# Patient Record
Sex: Female | Born: 1945 | Race: White | Hispanic: No | Marital: Single | Smoking: Never smoker
Health system: Southern US, Community
[De-identification: ages and names within clinical notes are randomized; demographics above are authoritative.]

## PROBLEM LIST (undated history)

## (undated) DIAGNOSIS — I10 Essential (primary) hypertension: Secondary | ICD-10-CM

## (undated) DIAGNOSIS — N289 Disorder of kidney and ureter, unspecified: Secondary | ICD-10-CM

## (undated) DIAGNOSIS — E119 Type 2 diabetes mellitus without complications: Secondary | ICD-10-CM

## (undated) DIAGNOSIS — I214 Non-ST elevation (NSTEMI) myocardial infarction: Secondary | ICD-10-CM

## (undated) DIAGNOSIS — A419 Sepsis, unspecified organism: Secondary | ICD-10-CM

## (undated) DIAGNOSIS — N179 Acute kidney failure, unspecified: Secondary | ICD-10-CM

## (undated) DIAGNOSIS — T884XXA Failed or difficult intubation, initial encounter: Secondary | ICD-10-CM

## (undated) DIAGNOSIS — I480 Paroxysmal atrial fibrillation: Secondary | ICD-10-CM

## (undated) HISTORY — PX: TONSILLECTOMY: SUR1361

## (undated) HISTORY — PX: FRACTURE SURGERY: SHX138

---

## 2012-02-10 ENCOUNTER — Ambulatory Visit: Payer: Self-pay | Admitting: Internal Medicine

## 2015-09-27 DIAGNOSIS — A419 Sepsis, unspecified organism: Secondary | ICD-10-CM

## 2015-09-27 HISTORY — DX: Sepsis, unspecified organism: A41.9

## 2015-10-20 ENCOUNTER — Inpatient Hospital Stay: Payer: Medicare HMO

## 2015-10-20 ENCOUNTER — Inpatient Hospital Stay
Admission: EM | Admit: 2015-10-20 | Discharge: 2015-11-09 | DRG: 870 | Disposition: A | Discharge status: Skilled Nursing Facility | Payer: Medicare HMO | Attending: Specialist | Admitting: Specialist

## 2015-10-20 ENCOUNTER — Emergency Department: Payer: Medicare HMO

## 2015-10-20 ENCOUNTER — Inpatient Hospital Stay: Admit: 2015-10-20 | Payer: Medicare HMO

## 2015-10-20 ENCOUNTER — Encounter: Payer: Self-pay | Admitting: Intensive Care

## 2015-10-20 ENCOUNTER — Other Ambulatory Visit: Payer: Self-pay

## 2015-10-20 DIAGNOSIS — R410 Disorientation, unspecified: Secondary | ICD-10-CM | POA: Diagnosis not present

## 2015-10-20 DIAGNOSIS — R4182 Altered mental status, unspecified: Secondary | ICD-10-CM | POA: Diagnosis not present

## 2015-10-20 DIAGNOSIS — E1165 Type 2 diabetes mellitus with hyperglycemia: Secondary | ICD-10-CM | POA: Diagnosis present

## 2015-10-20 DIAGNOSIS — R6521 Severe sepsis with septic shock: Secondary | ICD-10-CM | POA: Diagnosis not present

## 2015-10-20 DIAGNOSIS — G9340 Encephalopathy, unspecified: Secondary | ICD-10-CM | POA: Diagnosis not present

## 2015-10-20 DIAGNOSIS — I4891 Unspecified atrial fibrillation: Secondary | ICD-10-CM | POA: Diagnosis present

## 2015-10-20 DIAGNOSIS — R140 Abdominal distension (gaseous): Secondary | ICD-10-CM

## 2015-10-20 DIAGNOSIS — I214 Non-ST elevation (NSTEMI) myocardial infarction: Secondary | ICD-10-CM | POA: Diagnosis present

## 2015-10-20 DIAGNOSIS — A419 Sepsis, unspecified organism: Secondary | ICD-10-CM

## 2015-10-20 DIAGNOSIS — R601 Generalized edema: Secondary | ICD-10-CM | POA: Diagnosis not present

## 2015-10-20 DIAGNOSIS — Z4659 Encounter for fitting and adjustment of other gastrointestinal appliance and device: Secondary | ICD-10-CM

## 2015-10-20 DIAGNOSIS — Z452 Encounter for adjustment and management of vascular access device: Secondary | ICD-10-CM

## 2015-10-20 DIAGNOSIS — E669 Obesity, unspecified: Secondary | ICD-10-CM | POA: Diagnosis present

## 2015-10-20 DIAGNOSIS — R34 Anuria and oliguria: Secondary | ICD-10-CM | POA: Diagnosis not present

## 2015-10-20 DIAGNOSIS — M6282 Rhabdomyolysis: Secondary | ICD-10-CM | POA: Diagnosis not present

## 2015-10-20 DIAGNOSIS — N390 Urinary tract infection, site not specified: Secondary | ICD-10-CM | POA: Diagnosis present

## 2015-10-20 DIAGNOSIS — E872 Acidosis: Secondary | ICD-10-CM | POA: Diagnosis present

## 2015-10-20 DIAGNOSIS — K7200 Acute and subacute hepatic failure without coma: Secondary | ICD-10-CM | POA: Diagnosis present

## 2015-10-20 DIAGNOSIS — G9341 Metabolic encephalopathy: Secondary | ICD-10-CM | POA: Diagnosis present

## 2015-10-20 DIAGNOSIS — Z23 Encounter for immunization: Secondary | ICD-10-CM

## 2015-10-20 DIAGNOSIS — Z794 Long term (current) use of insulin: Secondary | ICD-10-CM

## 2015-10-20 DIAGNOSIS — N131 Hydronephrosis with ureteral stricture, not elsewhere classified: Secondary | ICD-10-CM | POA: Diagnosis not present

## 2015-10-20 DIAGNOSIS — Z978 Presence of other specified devices: Secondary | ICD-10-CM | POA: Insufficient documentation

## 2015-10-20 DIAGNOSIS — E861 Hypovolemia: Secondary | ICD-10-CM | POA: Diagnosis not present

## 2015-10-20 DIAGNOSIS — J9601 Acute respiratory failure with hypoxia: Secondary | ICD-10-CM | POA: Diagnosis not present

## 2015-10-20 DIAGNOSIS — E876 Hypokalemia: Secondary | ICD-10-CM | POA: Diagnosis not present

## 2015-10-20 DIAGNOSIS — E870 Hyperosmolality and hypernatremia: Secondary | ICD-10-CM | POA: Diagnosis present

## 2015-10-20 DIAGNOSIS — A4159 Other Gram-negative sepsis: Secondary | ICD-10-CM | POA: Insufficient documentation

## 2015-10-20 DIAGNOSIS — B964 Proteus (mirabilis) (morganii) as the cause of diseases classified elsewhere: Secondary | ICD-10-CM | POA: Diagnosis present

## 2015-10-20 DIAGNOSIS — J90 Pleural effusion, not elsewhere classified: Secondary | ICD-10-CM | POA: Diagnosis not present

## 2015-10-20 DIAGNOSIS — E119 Type 2 diabetes mellitus without complications: Secondary | ICD-10-CM

## 2015-10-20 DIAGNOSIS — N179 Acute kidney failure, unspecified: Secondary | ICD-10-CM

## 2015-10-20 DIAGNOSIS — I96 Gangrene, not elsewhere classified: Secondary | ICD-10-CM | POA: Diagnosis present

## 2015-10-20 DIAGNOSIS — E860 Dehydration: Secondary | ICD-10-CM | POA: Diagnosis not present

## 2015-10-20 DIAGNOSIS — Z6841 Body Mass Index (BMI) 40.0 and over, adult: Secondary | ICD-10-CM

## 2015-10-20 DIAGNOSIS — E11649 Type 2 diabetes mellitus with hypoglycemia without coma: Secondary | ICD-10-CM | POA: Diagnosis not present

## 2015-10-20 DIAGNOSIS — R0603 Acute respiratory distress: Secondary | ICD-10-CM | POA: Insufficient documentation

## 2015-10-20 DIAGNOSIS — I959 Hypotension, unspecified: Secondary | ICD-10-CM | POA: Diagnosis not present

## 2015-10-20 DIAGNOSIS — D7582 Heparin induced thrombocytopenia (HIT): Secondary | ICD-10-CM | POA: Diagnosis not present

## 2015-10-20 DIAGNOSIS — R0600 Dyspnea, unspecified: Secondary | ICD-10-CM | POA: Diagnosis not present

## 2015-10-20 DIAGNOSIS — N1330 Unspecified hydronephrosis: Secondary | ICD-10-CM | POA: Diagnosis not present

## 2015-10-20 DIAGNOSIS — R0602 Shortness of breath: Secondary | ICD-10-CM

## 2015-10-20 DIAGNOSIS — N170 Acute kidney failure with tubular necrosis: Secondary | ICD-10-CM | POA: Diagnosis not present

## 2015-10-20 DIAGNOSIS — J9690 Respiratory failure, unspecified, unspecified whether with hypoxia or hypercapnia: Secondary | ICD-10-CM | POA: Diagnosis not present

## 2015-10-20 DIAGNOSIS — Z789 Other specified health status: Secondary | ICD-10-CM | POA: Diagnosis not present

## 2015-10-20 DIAGNOSIS — I9589 Other hypotension: Secondary | ICD-10-CM | POA: Diagnosis not present

## 2015-10-20 DIAGNOSIS — E8809 Other disorders of plasma-protein metabolism, not elsewhere classified: Secondary | ICD-10-CM | POA: Diagnosis present

## 2015-10-20 LAB — URINALYSIS COMPLETE WITH MICROSCOPIC (ARMC ONLY)
BILIRUBIN URINE: NEGATIVE
Glucose, UA: NEGATIVE mg/dL
Nitrite: NEGATIVE
Protein, ur: 100 mg/dL — AB
Specific Gravity, Urine: 1.017 (ref 1.005–1.030)
pH: 6 (ref 5.0–8.0)

## 2015-10-20 LAB — CBC
HEMATOCRIT: 43.1 % (ref 35.0–47.0)
HEMOGLOBIN: 13.6 g/dL (ref 12.0–16.0)
MCH: 30.1 pg (ref 26.0–34.0)
MCHC: 31.6 g/dL — AB (ref 32.0–36.0)
MCV: 95 fL (ref 80.0–100.0)
Platelets: 46 10*3/uL — ABNORMAL LOW (ref 150–440)
RBC: 4.53 MIL/uL (ref 3.80–5.20)
RDW: 15 % — ABNORMAL HIGH (ref 11.5–14.5)
WBC: 47.1 10*3/uL — ABNORMAL HIGH (ref 3.6–11.0)

## 2015-10-20 LAB — CBC WITH DIFFERENTIAL/PLATELET
BASOS ABS: 0 10*3/uL (ref 0–0.1)
BLASTS: 0 %
Band Neutrophils: 36 %
Basophils Relative: 0 %
Eosinophils Absolute: 0 10*3/uL (ref 0–0.7)
Eosinophils Relative: 0 %
HEMATOCRIT: 49.9 % — AB (ref 35.0–47.0)
Hemoglobin: 16.4 g/dL — ABNORMAL HIGH (ref 12.0–16.0)
Lymphocytes Relative: 4 %
Lymphs Abs: 2 10*3/uL (ref 1.0–3.6)
MCH: 29.6 pg (ref 26.0–34.0)
MCHC: 32.8 g/dL (ref 32.0–36.0)
MCV: 90.2 fL (ref 80.0–100.0)
METAMYELOCYTES PCT: 1 %
MONOS PCT: 3 %
Monocytes Absolute: 1.5 10*3/uL — ABNORMAL HIGH (ref 0.2–0.9)
Myelocytes: 0 %
NEUTROS ABS: 47 10*3/uL — AB (ref 1.4–6.5)
NEUTROS PCT: 56 %
Other: 0 %
Platelets: 62 10*3/uL — ABNORMAL LOW (ref 150–440)
Promyelocytes Absolute: 0 %
RBC: 5.54 MIL/uL — AB (ref 3.80–5.20)
RDW: 14 % (ref 11.5–14.5)
WBC: 50.5 10*3/uL — AB (ref 3.6–11.0)
nRBC: 0 /100 WBC

## 2015-10-20 LAB — URINE DRUG SCREEN, QUALITATIVE (ARMC ONLY)
AMPHETAMINES, UR SCREEN: NOT DETECTED
BARBITURATES, UR SCREEN: NOT DETECTED
Benzodiazepine, Ur Scrn: NOT DETECTED
COCAINE METABOLITE, UR ~~LOC~~: NOT DETECTED
Cannabinoid 50 Ng, Ur ~~LOC~~: NOT DETECTED
MDMA (Ecstasy)Ur Screen: NOT DETECTED
METHADONE SCREEN, URINE: NOT DETECTED
OPIATE, UR SCREEN: NOT DETECTED
PHENCYCLIDINE (PCP) UR S: NOT DETECTED
Tricyclic, Ur Screen: NOT DETECTED

## 2015-10-20 LAB — HEPARIN LEVEL (UNFRACTIONATED)

## 2015-10-20 LAB — BLOOD GAS, ARTERIAL
Acid-base deficit: 15 mmol/L — ABNORMAL HIGH (ref 0.0–2.0)
Allens test (pass/fail): POSITIVE — AB
Bicarbonate: 13.6 mEq/L — ABNORMAL LOW (ref 21.0–28.0)
FIO2: 0.99
MECHVT: 500 mL
O2 Saturation: 95.6 %
PEEP: 5 cmH2O
Patient temperature: 37
RATE: 18 resp/min
pCO2 arterial: 41 mmHg (ref 32.0–48.0)
pH, Arterial: 7.13 — CL (ref 7.350–7.450)
pO2, Arterial: 103 mmHg (ref 83.0–108.0)

## 2015-10-20 LAB — COMPREHENSIVE METABOLIC PANEL
ALBUMIN: 3.1 g/dL — AB (ref 3.5–5.0)
ALK PHOS: 567 U/L — AB (ref 38–126)
ALT: 63 U/L — AB (ref 14–54)
AST: 124 U/L — AB (ref 15–41)
Anion gap: 19 — ABNORMAL HIGH (ref 5–15)
BILIRUBIN TOTAL: 2.5 mg/dL — AB (ref 0.3–1.2)
BUN: 66 mg/dL — AB (ref 6–20)
CALCIUM: 9 mg/dL (ref 8.9–10.3)
CO2: 20 mmol/L — ABNORMAL LOW (ref 22–32)
CREATININE: 3.01 mg/dL — AB (ref 0.44–1.00)
Chloride: 101 mmol/L (ref 101–111)
GFR calc Af Amer: 17 mL/min — ABNORMAL LOW (ref >60)
GFR calc non Af Amer: 15 mL/min — ABNORMAL LOW (ref >60)
GLUCOSE: 185 mg/dL — AB (ref 65–99)
POTASSIUM: 3.9 mmol/L (ref 3.5–5.1)
Sodium: 140 mmol/L (ref 135–145)
TOTAL PROTEIN: 7.4 g/dL (ref 6.5–8.1)

## 2015-10-20 LAB — BLOOD GAS, VENOUS
ACID-BASE DEFICIT: 12.2 mmol/L — AB (ref 0.0–2.0)
Acid-base deficit: 7.2 mmol/L — ABNORMAL HIGH (ref 0.0–2.0)
BICARBONATE: 19.7 meq/L — AB (ref 21.0–28.0)
BICARBONATE: 11.6 meq/L — AB (ref 21.0–28.0)
FIO2: 0.99
LHR: 30 {breaths}/min
MECHVT: 500 mL
PCO2 VEN: 22 mmHg — AB (ref 44.0–60.0)
PEEP: 5 cmH2O
PH VEN: 7.26 — AB (ref 7.320–7.430)
PH VEN: 7.33 (ref 7.320–7.430)
Patient temperature: 37
Patient temperature: 37
pCO2, Ven: 44 mmHg (ref 44.0–60.0)

## 2015-10-20 LAB — CREATININE, SERUM
Creatinine, Ser: 2.95 mg/dL — ABNORMAL HIGH (ref 0.44–1.00)
GFR calc non Af Amer: 15 mL/min — ABNORMAL LOW (ref >60)
GFR, EST AFRICAN AMERICAN: 18 mL/min — AB (ref >60)

## 2015-10-20 LAB — MRSA PCR SCREENING: MRSA by PCR: NEGATIVE

## 2015-10-20 LAB — LACTIC ACID, PLASMA
Lactic Acid, Venous: 6.6 mmol/L (ref 0.5–2.0)
Lactic Acid, Venous: 5.9 mmol/L (ref 0.5–2.0)

## 2015-10-20 LAB — PROTIME-INR
INR: 1.46
Prothrombin Time: 17.8 seconds — ABNORMAL HIGH (ref 11.4–15.0)

## 2015-10-20 LAB — TROPONIN I: TROPONIN I: 1.21 ng/mL — AB (ref <0.031)

## 2015-10-20 LAB — ACETAMINOPHEN LEVEL

## 2015-10-20 LAB — AMMONIA: AMMONIA: 34 umol/L (ref 9–35)

## 2015-10-20 LAB — LIPASE, BLOOD: Lipase: 11 U/L (ref 11–51)

## 2015-10-20 LAB — ETHANOL: Alcohol, Ethyl (B): <5 mg/dL (ref <5)

## 2015-10-20 LAB — SALICYLATE LEVEL: Salicylate Lvl: <4 mg/dL (ref 2.8–30.0)

## 2015-10-20 LAB — APTT: APTT: 35 s (ref 24–36)

## 2015-10-20 LAB — CK: Total CK: 1562 U/L — ABNORMAL HIGH (ref 38–234)

## 2015-10-20 MED ORDER — CHLORHEXIDINE GLUCONATE 0.12% ORAL RINSE (MEDLINE KIT)
15.0000 mL | Freq: Two times a day (BID) | OROMUCOSAL | Status: DC
  Administered 2015-10-21 – 2015-10-24 (×7): 15 mL via OROMUCOSAL
  Filled 2015-10-20 (×8): qty 15

## 2015-10-20 MED ORDER — FENTANYL CITRATE (PF) 100 MCG/2ML IJ SOLN
INTRAMUSCULAR | Status: AC
  Filled 2015-10-20: qty 2

## 2015-10-20 MED ORDER — HEPARIN BOLUS VIA INFUSION
4000.0000 [IU] | Freq: Once | INTRAVENOUS | Status: DC
  Filled 2015-10-20: qty 4000

## 2015-10-20 MED ORDER — SODIUM CHLORIDE 0.9 % IV SOLN
INTRAVENOUS | Status: DC
  Administered 2015-10-20 (×2): via INTRAVENOUS

## 2015-10-20 MED ORDER — SODIUM CHLORIDE 0.9 % IV BOLUS (SEPSIS)
1000.0000 mL | Freq: Once | INTRAVENOUS | Status: AC
  Administered 2015-10-20: 1000 mL via INTRAVENOUS

## 2015-10-20 MED ORDER — ROCURONIUM BROMIDE 50 MG/5ML IV SOLN
100.0000 mg | Freq: Once | INTRAVENOUS | Status: AC
  Administered 2015-10-20: 100 mg via INTRAVENOUS
  Filled 2015-10-20: qty 10

## 2015-10-20 MED ORDER — SODIUM CHLORIDE 0.9 % IV SOLN
1.0000 mg/h | INTRAVENOUS | Status: DC
  Filled 2015-10-20: qty 10

## 2015-10-20 MED ORDER — PANTOPRAZOLE SODIUM 40 MG IV SOLR
40.0000 mg | Freq: Every day | INTRAVENOUS | Status: DC
  Administered 2015-10-20 – 2015-10-22 (×3): 40 mg via INTRAVENOUS
  Filled 2015-10-20 (×3): qty 40

## 2015-10-20 MED ORDER — ANTISEPTIC ORAL RINSE SOLUTION (CORINZ)
7.0000 mL | OROMUCOSAL | Status: DC
  Administered 2015-10-21 – 2015-10-24 (×22): 7 mL via OROMUCOSAL
  Filled 2015-10-20 (×27): qty 7

## 2015-10-20 MED ORDER — FENTANYL 2500MCG IN NS 250ML (10MCG/ML) PREMIX INFUSION
0.0000 ug/h | INTRAVENOUS | Status: DC
  Administered 2015-10-20: 25 ug/h via INTRAVENOUS
  Administered 2015-10-20 – 2015-10-21 (×2): 10 ug/h via INTRAVENOUS
  Administered 2015-10-21: 300 ug/h via INTRAVENOUS
  Administered 2015-10-21: 150 ug/h via INTRAVENOUS
  Administered 2015-10-22: 200 ug/h via INTRAVENOUS
  Administered 2015-10-22: 300 ug/h via INTRAVENOUS
  Administered 2015-10-22: 80 ug/h via INTRAVENOUS
  Administered 2015-10-23: 250 ug/h via INTRAVENOUS
  Administered 2015-10-23: 200 ug/h via INTRAVENOUS
  Administered 2015-10-24: 325 ug/h via INTRAVENOUS
  Administered 2015-10-24: 350 ug/h via INTRAVENOUS
  Administered 2015-10-24: 325 ug/h via INTRAVENOUS
  Administered 2015-10-25: 400 ug/h via INTRAVENOUS
  Administered 2015-10-25: 350 ug/h via INTRAVENOUS
  Administered 2015-10-25: 400 ug/h via INTRAVENOUS
  Administered 2015-10-26: 200 ug/h via INTRAVENOUS
  Administered 2015-10-26: 400 ug/h via INTRAVENOUS
  Administered 2015-10-27 (×2): 275 ug/h via INTRAVENOUS
  Administered 2015-10-28: 125 ug/h via INTRAVENOUS
  Administered 2015-10-28: 275 ug/h via INTRAVENOUS
  Administered 2015-10-29: 125 ug/h via INTRAVENOUS
  Filled 2015-10-20 (×23): qty 250

## 2015-10-20 MED ORDER — AMIODARONE IV BOLUS ONLY 150 MG/100ML
150.0000 mg | Freq: Once | INTRAVENOUS | Status: AC
  Administered 2015-10-20: 150 mg via INTRAVENOUS

## 2015-10-20 MED ORDER — DEXTROSE 5 % IV SOLN
0.0000 ug/min | INTRAVENOUS | Status: DC
  Administered 2015-10-20: 20 ug/min via INTRAVENOUS
  Administered 2015-10-20: 30 ug/min via INTRAVENOUS
  Administered 2015-10-21: 47 ug/min via INTRAVENOUS
  Administered 2015-10-21: 61 ug/min via INTRAVENOUS
  Administered 2015-10-21: 60 ug/min via INTRAVENOUS
  Administered 2015-10-21: 65 ug/min via INTRAVENOUS
  Administered 2015-10-22: 20 ug/min via INTRAVENOUS
  Administered 2015-10-22: 30 ug/min via INTRAVENOUS
  Administered 2015-10-25: 2 ug/min via INTRAVENOUS
  Filled 2015-10-20 (×9): qty 16

## 2015-10-20 MED ORDER — SODIUM BICARBONATE 8.4 % IV SOLN
INTRAVENOUS | Status: DC
  Administered 2015-10-21 – 2015-10-22 (×4): via INTRAVENOUS
  Filled 2015-10-20 (×10): qty 150

## 2015-10-20 MED ORDER — HEPARIN SODIUM (PORCINE) 1000 UNIT/ML DIALYSIS
1000.0000 [IU] | INTRAMUSCULAR | Status: DC | PRN
  Filled 2015-10-20 (×2): qty 6

## 2015-10-20 MED ORDER — ONDANSETRON HCL 4 MG PO TABS: 4.0000 mg | ORAL_TABLET | Freq: Four times a day (QID) | ORAL | Status: DC | PRN

## 2015-10-20 MED ORDER — SODIUM BICARBONATE 8.4 % IV SOLN
50.0000 meq | Freq: Once | INTRAVENOUS | Status: AC
  Administered 2015-10-20: 50 meq via INTRAVENOUS

## 2015-10-20 MED ORDER — FENTANYL CITRATE (PF) 100 MCG/2ML IJ SOLN
INTRAMUSCULAR | Status: AC
  Administered 2015-10-20: 100 ug
  Filled 2015-10-20: qty 2

## 2015-10-20 MED ORDER — VASOPRESSIN 20 UNIT/ML IV SOLN
0.0300 [IU]/min | INTRAVENOUS | Status: DC
  Administered 2015-10-20 – 2015-10-22 (×3): 0.03 [IU]/min via INTRAVENOUS
  Filled 2015-10-20 (×5): qty 2

## 2015-10-20 MED ORDER — NOREPINEPHRINE BITARTRATE 1 MG/ML IV SOLN
0.0000 ug/min | Freq: Once | INTRAVENOUS | Status: AC
  Administered 2015-10-20: 28 ug/min via INTRAVENOUS
  Filled 2015-10-20: qty 4

## 2015-10-20 MED ORDER — NOREPINEPHRINE BITARTRATE 1 MG/ML IV SOLN
0.0000 ug/min | Freq: Once | INTRAVENOUS | Status: AC
  Administered 2015-10-20: 30 ug/min via INTRAVENOUS
  Filled 2015-10-20: qty 4

## 2015-10-20 MED ORDER — AMIODARONE HCL IN DEXTROSE 360-4.14 MG/200ML-% IV SOLN
60.0000 mg/h | INTRAVENOUS | Status: DC
  Administered 2015-10-20: 60 mg/h via INTRAVENOUS
  Filled 2015-10-20 (×2): qty 200

## 2015-10-20 MED ORDER — VANCOMYCIN HCL 10 G IV SOLR
1500.0000 mg | INTRAVENOUS | Status: AC
  Administered 2015-10-20: 1500 mg via INTRAVENOUS
  Filled 2015-10-20: qty 1500

## 2015-10-20 MED ORDER — ACETAMINOPHEN 650 MG RE SUPP
650.0000 mg | Freq: Four times a day (QID) | RECTAL | Status: DC | PRN
Start: 2015-10-20 — End: 2015-11-09
  Administered 2015-10-30: 650 mg via RECTAL
  Filled 2015-10-20: qty 1

## 2015-10-20 MED ORDER — ONDANSETRON HCL 4 MG/2ML IJ SOLN: 4.0000 mg | Freq: Four times a day (QID) | INTRAMUSCULAR | Status: DC | PRN

## 2015-10-20 MED ORDER — PUREFLOW DIALYSIS SOLUTION
INTRAVENOUS | Status: DC
  Administered 2015-10-20: 21:00:00 via INTRAVENOUS_CENTRAL
  Administered 2015-10-21: 3 via INTRAVENOUS_CENTRAL
  Administered 2015-10-21: 04:00:00 via INTRAVENOUS_CENTRAL
  Administered 2015-10-21: 3 via INTRAVENOUS_CENTRAL
  Administered 2015-10-22: 01:00:00 via INTRAVENOUS_CENTRAL

## 2015-10-20 MED ORDER — AMIODARONE LOAD VIA INFUSION
150.0000 mg | Freq: Once | INTRAVENOUS | Status: AC
  Administered 2015-10-20: 150 mg via INTRAVENOUS
  Filled 2015-10-20: qty 83.34

## 2015-10-20 MED ORDER — PIPERACILLIN-TAZOBACTAM 3.375 G IVPB
3.3750 g | Freq: Once | INTRAVENOUS | Status: AC
  Administered 2015-10-20: 3.375 g via INTRAVENOUS
  Filled 2015-10-20: qty 50

## 2015-10-20 MED ORDER — MIDAZOLAM HCL 2 MG/2ML IJ SOLN
INTRAMUSCULAR | Status: AC
  Administered 2015-10-20: 2 mg
  Filled 2015-10-20: qty 2

## 2015-10-20 MED ORDER — LORAZEPAM 2 MG/ML IJ SOLN
1.0000 mg | Freq: Once | INTRAMUSCULAR | Status: AC
  Administered 2015-10-20: 1 mg via INTRAVENOUS
  Filled 2015-10-20: qty 1

## 2015-10-20 MED ORDER — PIPERACILLIN-TAZOBACTAM 3.375 G IVPB
3.3750 g | Freq: Three times a day (TID) | INTRAVENOUS | Status: DC
  Administered 2015-10-20 – 2015-10-21 (×2): 3.375 g via INTRAVENOUS
  Filled 2015-10-20 (×4): qty 50

## 2015-10-20 MED ORDER — MIDAZOLAM HCL 2 MG/2ML IJ SOLN
INTRAMUSCULAR | Status: AC
  Filled 2015-10-20: qty 2

## 2015-10-20 MED ORDER — HEPARIN SODIUM (PORCINE) 5000 UNIT/ML IJ SOLN: 5000.0000 [IU] | Freq: Three times a day (TID) | INTRAMUSCULAR | Status: DC

## 2015-10-20 MED ORDER — ACETAMINOPHEN 325 MG PO TABS
650.0000 mg | ORAL_TABLET | Freq: Four times a day (QID) | ORAL | Status: DC | PRN
  Administered 2015-10-22 – 2015-10-28 (×6): 650 mg via ORAL
  Filled 2015-10-20 (×6): qty 2

## 2015-10-20 MED ORDER — VANCOMYCIN HCL IN DEXTROSE 1-5 GM/200ML-% IV SOLN: 1000.0000 mg | INTRAVENOUS | Status: DC

## 2015-10-20 MED ORDER — HEPARIN (PORCINE) IN NACL 100-0.45 UNIT/ML-% IJ SOLN
1550.0000 [IU]/h | INTRAMUSCULAR | Status: DC
  Administered 2015-10-20: 1200 [IU]/h via INTRAVENOUS
  Filled 2015-10-20 (×2): qty 250

## 2015-10-20 MED ORDER — ETOMIDATE 2 MG/ML IV SOLN
30.0000 mg | Freq: Once | INTRAVENOUS | Status: AC
  Administered 2015-10-20: 30 mg via INTRAVENOUS

## 2015-10-20 MED ORDER — VANCOMYCIN HCL IN DEXTROSE 1-5 GM/200ML-% IV SOLN
1000.0000 mg | Freq: Once | INTRAVENOUS | Status: AC
  Administered 2015-10-20: 1000 mg via INTRAVENOUS
  Filled 2015-10-20: qty 200

## 2015-10-20 MED ORDER — AMIODARONE HCL IN DEXTROSE 360-4.14 MG/200ML-% IV SOLN
30.0000 mg/h | INTRAVENOUS | Status: DC
  Administered 2015-10-21 (×2): 60 mg/h via INTRAVENOUS
  Filled 2015-10-20 (×6): qty 200

## 2015-10-20 MED ORDER — SODIUM BICARBONATE 8.4 % IV SOLN
INTRAVENOUS | Status: AC
  Administered 2015-10-20: 50 meq via INTRAVENOUS
  Filled 2015-10-20: qty 50

## 2015-10-20 NOTE — ED Notes (Signed)
Tried calling pts family to obtain allergy information. Number in chart is a non working number.

## 2015-10-20 NOTE — Progress Notes (Signed)
PULMONARY / CRITICAL CARE MEDICINE   Name: April [name] MRN: [id number] DOB: 1946/08/13    ADMISSION DATE:  10/20/2015 CONSULTATION DATE:  10/20/2015  REFERRING MD :  Dr. Fritzi Mandes   CHIEF COMPLAINT:   Respiratory failure Unresponsive   HISTORY OF PRESENT ILLNESS   History per chart review - no family present, patient intubated.   70 y.o. female with unknown past medical history was picked up by EMS today after being found at her house laying on the floor minimally responsive. Per coworkers she has not been to work for 2 days and when they checked on her they found her lying in the floor minimally responsive. EMS was called. Upon arrival to the ED, she only withdrew to pain, due to low GCS and inability to protect her airway she was intubated.     SIGNIFICANT EVENTS  1/24>>found at home unresponsive, intubated in [facility] ED 1/24>>RIJ vascath placed.    PAST MEDICAL HISTORY    :  No past medical history on file. No past surgical history on file. Prior to Admission medications   Not on File   Allergies  Allergen Reactions  . Prednisone Anaphylaxis     FAMILY HISTORY   No family history on file.    SOCIAL HISTORY    has no tobacco, alcohol, and drug history on file.  Review of Systems  Unable to perform ROS: intubated      VITAL SIGNS    Temp:  [96.8 F (36 C)-99 F (37.2 C)] 99 F (37.2 C) (01/24 1700) Pulse Rate:  [153-168] 153 (01/24 1700) Resp:  [18-33] 21 (01/24 1700) BP: (64-127)/(34-101) 110/53 mmHg (01/24 1700) SpO2:  [95 %-99 %] 99 % (01/24 1700) FiO2 (%):  [100 %] 100 % (01/24 1700) Weight:  [249 lb 12.8 oz (113.309 kg)] 249 lb 12.8 oz (113.309 kg) (01/24 1022) HEMODYNAMICS:   VENTILATOR SETTINGS: Vent Mode:  [-] PRVC FiO2 (%):  [100 %] 100 % Set Rate:  [18 bmp] 18 bmp Vt Set:  [500 mL] 500 mL PEEP:  [5 cmH20] 5 cmH20 INTAKE / OUTPUT:  Intake/Output Summary (Last 24 hours) at 10/20/15 1805 Last data filed at 10/20/15 1412  Gross  per 24 hour  Intake 315.88 ml  Output     20 ml  Net 295.88 ml       PHYSICAL EXAM   Physical Exam  Constitutional: She appears well-developed and well-nourished.  HENT:  Head: Normocephalic and atraumatic.  Right Ear: External ear normal.  Left Ear: External ear normal.  Eyes: Right eye exhibits no discharge. Left eye exhibits no discharge.  Neck: Neck supple. No JVD present. No tracheal deviation present. No thyromegaly present.  Cardiovascular: Regular rhythm, normal heart sounds and intact distal pulses.   Pulmonary/Chest: She is in respiratory distress. She has no wheezes. She has no rales.  Intubated No crackles No wheezes  Abdominal: Soft. Bowel sounds are normal. She exhibits no distension. There is no tenderness.  Musculoskeletal: She exhibits no edema.  Neurological:  Intubated and sedated  Skin: She is diaphoretic.  Cool mottled lower extremities, bilaterally   Nursing note and vitals reviewed.      LABS  The following labs were personally reviewed on 10/20/15) LABS:  CBC  Recent Labs Lab 10/20/15 0957  WBC 50.5*  HGB 16.4*  HCT 49.9*  PLT 62*   Coag's  Recent Labs Lab 10/20/15 0957  APTT 35  INR 1.46   BMET  Recent Labs Lab 10/20/15 0957  NA  140  K 3.9  CL 101  CO2 20*  BUN 66*  CREATININE 2.95*  3.01*  GLUCOSE 185*   Electrolytes  Recent Labs Lab 10/20/15 0957  CALCIUM 9.0   Sepsis Markers  Recent Labs Lab 10/20/15 0957 10/20/15 1254  LATICACIDVEN 6.6* 5.9*   ABG No results for input(s): PHART, PCO2ART, PO2ART in the last 168 hours. Liver Enzymes  Recent Labs Lab 10/20/15 0957  AST 124*  ALT 63*  ALKPHOS 567*  BILITOT 2.5*  ALBUMIN 3.1*   Cardiac Enzymes  Recent Labs Lab 10/20/15 0957  TROPONINI 1.21*   Glucose No results for input(s): GLUCAP in the last 168 hours.   Recent Results (from the past 240 hour(s))  Urine culture     Status: None (Preliminary result)   Collection Time: 10/20/15   9:57 AM  Result Value Ref Range Status   Specimen Description URINE, RANDOM  Final   Special Requests NONE  Final   Culture NO GROWTH < 12 HOURS  Final   Report Status PENDING  Incomplete  Culture, blood (routine x 2)     Status: None (Preliminary result)   Collection Time: 10/20/15  9:57 AM  Result Value Ref Range Status   Specimen Description BLOOD RIGHT WRIST  Final   Special Requests   Final    BOTTLES DRAWN AEROBIC AND ANAEROBIC ANA 1ML AER 4ML   Culture NO GROWTH < 12 HOURS  Final   Report Status PENDING  Incomplete  Culture, blood (routine x 2)     Status: None (Preliminary result)   Collection Time: 10/20/15  9:57 AM  Result Value Ref Range Status   Specimen Description BLOOD LEFT HAND  Final   Special Requests   Final    BOTTLES DRAWN AEROBIC AND ANAEROBIC AER 3ML ANA 2ML   Culture NO GROWTH < 12 HOURS  Final   Report Status PENDING  Incomplete     Current facility-administered medications:  .  0.9 %  sodium chloride infusion, , Intravenous, Continuous, Fritzi Mandes, MD .  acetaminophen (TYLENOL) tablet 650 mg, 650 mg, Oral, Q6H PRN **OR** acetaminophen (TYLENOL) suppository 650 mg, 650 mg, Rectal, Q6H PRN, Fritzi Mandes, MD .  fentaNYL (SUBLIMAZE) 100 MCG/2ML injection, , , ,  .  fentaNYL 2556mg in NS 2547m(1042mml) infusion-PREMIX, 10 mcg/hr, Intravenous, Continuous, RebLisa RocaD, Stopped at 10/20/15 1229 .  heparin ADULT infusion 100 units/mL (25000 units/250 mL), 1,200 Units/hr, Intravenous, Continuous, SonFritzi MandesD .  heparin bolus via infusion 4,000 Units, 4,000 Units, Intravenous, Once, SonFritzi MandesD .  midazolam (VERSED) 2 MG/2ML injection, , , ,  .  midazolam (VERSED) 50 mg in sodium chloride 0.9 % 50 mL (1 mg/mL) infusion, 1 mg/hr, Intravenous, Continuous, RebLisa RocaD, Stopped at 10/20/15 1045 .  ondansetron (ZOFRAN) tablet 4 mg, 4 mg, Oral, Q6H PRN **OR** ondansetron (ZOFRAN) injection 4 mg, 4 mg, Intravenous, Q6H PRN, SonFritzi MandesD .  pantoprazole  (PROTONIX) injection 40 mg, 40 mg, Intravenous, QHS, Nicolette Gieske, MD .  piperacillin-tazobactam (ZOSYN) IVPB 3.375 g, 3.375 g, Intravenous, 3 times per day, SonFritzi MandesD  IMAGING  (The following images and results were reviewed by Dr. MunStevenson Clinch 10/20/2015).    Ct Abdomen Pelvis Wo Contrast  10/20/2015  CLINICAL DATA:  Patient had not been hurt from for couple days, was found by EMS between the wall and the bed, responsive to pain, leukocytosis, elevated glucose and creatinine, abdominal swelling, pelvic pain EXAM: CT ABDOMEN AND PELVIS WITHOUT CONTRAST  TECHNIQUE: Multidetector CT imaging of the abdomen and pelvis was performed following the standard protocol without IV contrast. Sagittal and coronal MPR images reconstructed from axial data set. COMPARISON:  None FINDINGS: Bibasilar atelectasis. Calcifications at LEFT lung base may represent calcified granuloma within atelectatic lower lobe. Marked RIGHT hydronephrosis without definite ureteral calcification or dilatation. Edema surrounding the RIGHT kidney can be related to obstruction. Small nonobstructing calculus 4 mm diameter RIGHT kidney image 51. Beam hardening artifacts from patient's arms traverse upper abdomen. Within limits of noncontrast technique and beam hardening artifacts, no definite additional abnormalities of the liver, spleen, atrophic pancreas, or kidneys. BILATERAL thickened adrenal glands without discrete mass. Gallbladder distended without mass or surrounding inflammatory changes. Extensive colonic diverticulosis without evidence of diverticulitis. Stomach and remaining bowel loops normal appearance. Small umbilical hernia containing fat. Normal appendix. Unremarkable uterus and RIGHT adnexa. LEFT ovary prominent size for age at 3.8 x 3.6 x 3.6 cm. Bladder decompressed by Foley catheter. Shotty retroperitoneal nodes without adenopathy. No mass, adenopathy, free air or free fluid. Scattered atherosclerotic calcifications. Bones  demineralized. IMPRESSION: RIGHT hydronephrosis question UPJ obstruction; in the setting of leukocytosis, recommend correlation with urinalysis to exclude urinary tract infection. Scattered colonic diverticulosis. Prominent LEFT ovarian size for age ; non emergent followup pelvic and transvaginal sonography recommended to characterize. Electronically Signed   By: Lavonia Dana M.D.   On: 10/20/2015 12:50   Ct Head Wo Contrast  10/20/2015  CLINICAL DATA:  Found unresponsive today. EXAM: CT HEAD WITHOUT CONTRAST CT CERVICAL SPINE WITHOUT CONTRAST TECHNIQUE: Multidetector CT imaging of the head and cervical spine was performed following the standard protocol without intravenous contrast. Multiplanar CT image reconstructions of the cervical spine were also generated. COMPARISON:  None. FINDINGS: CT HEAD FINDINGS Normal appearing cerebral hemispheres and posterior fossa structures. Normal size and position of the ventricles. No intracranial hemorrhage, mass lesion or CT evidence of acute infarction. Unremarkable bones. Mild mucosal thickening involving all of the paranasal sinuses with a small amount of fluid in the sphenoid sinus bilaterally. CT CERVICAL SPINE FINDINGS An endotracheal tube is in place. Multilevel degenerative changes. Straightening of the normal cervical lordosis. No prevertebral soft tissue swelling, fractures or subluxations. Bilateral carotid artery calcifications. IMPRESSION: 1. No acute intracranial abnormality. 2. No cervical spine fracture or subluxation. 3. Mild chronic pansinusitis with an acute component in the sphenoid sinuses. 4. Cervical spine degenerative changes. 5. Bilateral carotid artery atheromatous calcifications. Electronically Signed   By: Claudie Revering M.D.   On: 10/20/2015 13:25   Ct Cervical Spine Wo Contrast  10/20/2015  CLINICAL DATA:  Found unresponsive today. EXAM: CT HEAD WITHOUT CONTRAST CT CERVICAL SPINE WITHOUT CONTRAST TECHNIQUE: Multidetector CT imaging of the head  and cervical spine was performed following the standard protocol without intravenous contrast. Multiplanar CT image reconstructions of the cervical spine were also generated. COMPARISON:  None. FINDINGS: CT HEAD FINDINGS Normal appearing cerebral hemispheres and posterior fossa structures. Normal size and position of the ventricles. No intracranial hemorrhage, mass lesion or CT evidence of acute infarction. Unremarkable bones. Mild mucosal thickening involving all of the paranasal sinuses with a small amount of fluid in the sphenoid sinus bilaterally. CT CERVICAL SPINE FINDINGS An endotracheal tube is in place. Multilevel degenerative changes. Straightening of the normal cervical lordosis. No prevertebral soft tissue swelling, fractures or subluxations. Bilateral carotid artery calcifications. IMPRESSION: 1. No acute intracranial abnormality. 2. No cervical spine fracture or subluxation. 3. Mild chronic pansinusitis with an acute component in the sphenoid sinuses. 4. Cervical spine  degenerative changes. 5. Bilateral carotid artery atheromatous calcifications. Electronically Signed   By: Claudie Revering M.D.   On: 10/20/2015 13:25   Dg Chest Port 1 View  10/20/2015  CLINICAL DATA:  Hypoxia. EXAM: PORTABLE CHEST 1 VIEW COMPARISON:  None. FINDINGS: Endotracheal tube tip is 4.5 cm above the carina. No pneumothorax. There is no edema or consolidation. Heart size and pulmonary vascularity within normal limits. No adenopathy. No bone lesions. IMPRESSION: Endotracheal tube as described without pneumothorax. No edema or consolidation. Electronically Signed   By: Lowella Grip III M.D.   On: 10/20/2015 10:53      Indwelling Urinary Catheter continued, requirement due to   Reason to continue Indwelling Urinary Catheter for strict Intake/Output monitoring for hemodynamic instability   Central Line continued, requirement due to   Reason to continue [employer] Monitoring of central venous pressure or other  hemodynamic parameters   Ventilator continued, requirement due to, resp failure    Ventilator Sedation RASS 0 to -2   Cultures: BCx2 x2  1/24>> UC 1/24>> Sputum  Antibiotics: Vanc 1/24>> Zosyn 1/24>>  Lines: RIJ Vascath 1/24>>  ASSESSMENT/PLAN  70 yo Female with no sig PMHX, found down and unresponsive, intubated in the ED for respiratory distress, found to have leukocytosis, NSTEMI, UA concerning for infection, respiratory failure, possible rhabdomyolysis.   PULMONARY OETT 7.3m Respiratory failure - hypoxic Septic shock P:   - cont with MV - prn ABG - wean MV as tolerated - maintain sats >90% - WUA\SBT daily  CARDIOVASCULAR CVL - RIJ VASCATH Shock - septic NSTEMI  P:  - maintain MAP >65 - vasopressors as needed  - monitor BP - trend CE - cont with heparin gtt - mostly likely demand ischemia - EKG reviewed, no sig ST changes.   RENAL Anuria ARF UTI ?Rhabdomyolysis P:   -IVF - monitor Cr - may need CRRT, RIJ vascath placed.  - follow up Ur Cx -current CK~1500, cont to follow, IVF -Etoh, salicylate levels normal   GASTROINTESTINAL SUP - PPI Elevated LFTs - trend  HEMATOLOGIC Leukocytosis P:  -most likely related to septic shock - possible source urine -follow CBC -cont abx  INFECTIOUS Sepsis  P:   - cont with current abx - follow up bld and ur cx  ENDOCRINE ICU hypo/hyperglycemia protocol  NEUROLOGIC A:  Acute encephalopathy P:   RASS goal: -1 - metabolic encephalopathy - cont to monitor neuro status.    I have personally obtained a history, examined the patient, evaluated laboratory and imaging results, formulated the assessment and plan and placed orders.  The Patient requires high complexity decision making for assessment and support, frequent evaluation and titration of therapies, application of advanced monitoring technologies and extensive interpretation of multiple databases. Critical Care Time devoted to patient care  services described in this note is 55 minutes.   Overall, patient is critically ill, prognosis is guarded. Patient at high risk for cardiac arrest and death.   VVilinda Boehringer MD [facility] Pager [phone](please enter 7-digits) On Call Pager [phone](please enter 7-digits)     10/20/2015, 6:05 PM  Note: This note was prepared with Dragon dictation along with smaller phrase technology. Any transcriptional errors that result from this process are unintentional.

## 2015-10-20 NOTE — Progress Notes (Signed)
Spoke with Dr. Curt Bears Cardiologist on call informed md pt currently afibb heart rate 150-180's pt currently on 20 mcg of levophed asked md if he wanted pt to receive Amiodarone drip. Given orders to administer Amiodarone bolus and Amiodarone drip

## 2015-10-20 NOTE — Progress Notes (Addendum)
ANTIBIOTIC CONSULT NOTE - INITIAL  Pharmacy Consult for Vancomycin and Zosyn therapy Indication: Sepsis  Allergies  Allergen Reactions  . Prednisone Anaphylaxis    Patient Measurements: Weight: 249 lb 12.8 oz (113.309 kg) Adjusted Body Weight: 83.6 kg (based on height estimation of 5'8)  Vital Signs: Temp: 97.7 F (36.5 C) (01/24 1315) Temp Source: Temporal (01/24 1022) BP: 100/68 mmHg (01/24 1315) Pulse Rate: 164 (01/24 1315) Intake/Output from previous day:   Intake/Output from this shift: Total I/O In: -  Out: 20 [Urine:20]  Labs:  Recent Labs  10/20/15 0957  WBC 50.5*  HGB 16.4*  PLT 62*  CREATININE 3.01*   CrCl cannot be calculated (Unknown ideal weight.). No results for input(s): VANCOTROUGH, VANCOPEAK, VANCORANDOM, GENTTROUGH, GENTPEAK, GENTRANDOM, TOBRATROUGH, TOBRAPEAK, TOBRARND, AMIKACINPEAK, AMIKACINTROU, AMIKACIN in the last 72 hours.   Microbiology: No results found for this or any previous visit (from the past 720 hour(s)).  Medical History: No past medical history on file.  Medications:   (Not in a hospital admission) Scheduled:  . heparin  4,000 Units Intravenous Once   Assessment: Patient is currently admitted to ED and  intubated. Patient's lactic acid is 5.9. Based on serum creatinine of 3.0, patient is on AKI and CrCl can't accurately used to estimated renal function. Her adjusted body weight was estimated @ 83.6 kg (using an estimated height of 5'8)   Patient received 1 gram of Vancomycin @ ~11:00 and 3.375 grams of Zosyn.   Goal of Therapy:  Vancomycin trough level 15-20 mcg/ml  Plan:  Will give an addition 1500 mg to = 2500 mg of Vancomycin, which would be ~22 mg/kg of Vancomycin. Will order a random level 24 hours post dose of Vancomycin to assess renal function and opportunity for redosing. Serum creatinine will be ordered as well to assess renal function and assist with dosing   Zosyn: Will start Zosyn 3.375 g IV q8 hours for  now.   ADD:  Patient now being started on CRRT per nephrology. Will continue current plan. Vancomycin level ordered for 24 hours post dose. If level within goal range of 15-25 mcg/ml, will start Vancomycin 1 g IV X09 hours per policy.   Follow up culture results  Ernest Orr D 10/20/2015,2:51 PM

## 2015-10-20 NOTE — ED Notes (Addendum)
Patient arrived by EMS from home. Patient had not shown up to work in a couple of days. A friend went to check on patient at home and found pt between the wall and the bed. Patient is responsive to pain only. Patient is tachycardia upon arrival. Extremities cold to touch.

## 2015-10-20 NOTE — Progress Notes (Signed)
RT called to [facility] for transporting patient to CT.  Patient transported to CT on trilogy vent without complication.  Patient returned to [facility].  Will continue to monitor.

## 2015-10-20 NOTE — ED Notes (Signed)
MD at bedside. Updated coworkers on pts status.

## 2015-10-20 NOTE — Progress Notes (Signed)
Pt arrived from ED accompanied by RN, ED tech and RT already intubated and on vent. Pt had 7th 1L ns bolus and vancomycin running. ED RN stated Levophed run out, pharmacy was notified and so line was clamped. BP recycled but unable to get reading. ICU charge RN mixed levophed and gtt stated at 4mg.Pt BP recycled, MAP 54. Pt pulses were not palpable, obtained by doppler. Pt responds to pain only. Skin is mottled, extremities cold to the touch, core temp 99. Pt transferred to ICU bed, ICU monitors applied. April [name] Clinchto place cental line and vascath. Pt bp recycled/rechecked 110/64.

## 2015-10-20 NOTE — ED Notes (Signed)
C collar removed

## 2015-10-20 NOTE — H&P (Signed)
April [name] at [facility] NAME: April [name]    MR#:  [id number]  DATE OF BIRTH:  02/02/1946  DATE OF ADMISSION:  10/20/2015  PRIMARY CARE PHYSICIAN: April Billet, MD   REQUESTING/REFERRING PHYSICIAN Dr Edd Fabian  CHIEF COMPLAINT:  Found unresponsive by coworkers. Unknown downtime. Very limited history since patient has no relatives but a old uncle who is not able to tell me about her medical problems HISTORY OF PRESENT ILLNESS:  April [name]  is a 70 y.o. female with no past medical history comes to the emergency room after she was found lying on the floor minimally responsive. Patient had not shown up to work for 2 days and coworkers checked on her and they found her lying on the floor. Down time not known. She was reportedly severely swollen at baseline by coworker. Patient was brought to the emergency room intubated on the ventilator. She started on IV fentanyl and received IV Zosyn and vancomycin. She is on ibuprofen. In the ER patient received 6 L of IV fluids. She is being admitted with septic shock acute renal failure.  PAST MEDICAL HISTORY:  Unable to obtain since patient is intubated on the ventilator  PAST SURGICAL HISTOIRY:  No past surgical history on file.  SOCIAL HISTORY:   Social History  Substance Use Topics  . Smoking status: Unknown If Ever Smoked  . Smokeless tobacco: Not on file  . Alcohol Use: Not on file    FAMILY HISTORY:  Unable to obtain since patient is on the ventilator  DRUG ALLERGIES:   Allergies  Allergen Reactions  . Prednisone Anaphylaxis    REVIEW OF SYSTEMS:  Review of Systems  Unable to perform ROS: intubated     MEDICATIONS AT HOME:   Prior to Admission medications   Not on File      VITAL SIGNS:  Blood pressure 125/54, pulse 156, temperature 98.7 F (37.1 C), temperature source Temporal, resp. rate 20, weight 113.309 kg (249 lb 12.8 oz), SpO2 96 %.  PHYSICAL EXAMINATION:   GENERAL:  70 y.o.-year-old patient lying in the bedcritically ill intubated on the ventilator  EYES: Pupils equal, round,  minimal reactive to light and accommodation. No scleral icterus. Patient has edematous cornea  HEENT: Head atraumatic, normocephalic. Oropharynx and nasopharynx clear.Intubated on the ventilator. Puffy face.  NECK:  Supple, no jugular venous distention. No thyroid enlargement, no tenderness.  LUNGS Decreasedl breath sounds bilaterally, no wheezing, rales,rhonchi or crepitation. No use of accessory muscles of respiration.  CARDIOVASCULAR: S1, S2 normal. No murmurs, rubs, or gallops. Severe tachycardia heart rate in the 160s  ABDOMEN: Soft, nontender, nondistended.  febrile Bowel sounds present. No organomegaly or mass. Morbidly obese  EXTREMITIES:2+  pedal edema,  no cyanosis, or clubbing. Cold extremities  NEUROLOGIC:Unable to exam secondary to patient being on the ventilator Psychiatry patient intubated and on the vent  SKIN:Severe bilateral lower extremity mottling present  LABORATORY PANEL:   CBC  Recent Labs Lab 10/20/15 0957  WBC 50.5*  HGB 16.4*  HCT 49.9*  PLT 62*   ------------------------------------------------------------------------------------------------------------------  Chemistries   Recent Labs Lab 10/20/15 0957  NA 140  K 3.9  CL 101  CO2 20*  GLUCOSE 185*  BUN 66*  CREATININE 2.95*  3.01*  CALCIUM 9.0  AST 124*  ALT 63*  ALKPHOS 567*  BILITOT 2.5*   ------------------------------------------------------------------------------------------------------------------  Cardiac Enzymes  Recent Labs Lab 10/20/15 0957  TROPONINI 1.21*   ------------------------------------------------------------------------------------------------------------------  RADIOLOGY:  Ct Abdomen Pelvis  Wo Contrast  10/20/2015  CLINICAL DATA:  Patient had not been hurt from for couple days, was found by EMS between the wall and the bed, responsive to  pain, leukocytosis, elevated glucose and creatinine, abdominal swelling, pelvic pain EXAM: CT ABDOMEN AND PELVIS WITHOUT CONTRAST TECHNIQUE: Multidetector CT imaging of the abdomen and pelvis was performed following the standard protocol without IV contrast. Sagittal and coronal MPR images reconstructed from axial data set. COMPARISON:  None FINDINGS: Bibasilar atelectasis. Calcifications at LEFT lung base may represent calcified granuloma within atelectatic lower lobe. Marked RIGHT hydronephrosis without definite ureteral calcification or dilatation. Edema surrounding the RIGHT kidney can be related to obstruction. Small nonobstructing calculus 4 mm diameter RIGHT kidney image 51. Beam hardening artifacts from patient's arms traverse upper abdomen. Within limits of noncontrast technique and beam hardening artifacts, no definite additional abnormalities of the liver, spleen, atrophic pancreas, or kidneys. BILATERAL thickened adrenal glands without discrete mass. Gallbladder distended without mass or surrounding inflammatory changes. Extensive colonic diverticulosis without evidence of diverticulitis. Stomach and remaining bowel loops normal appearance. Small umbilical hernia containing fat. Normal appendix. Unremarkable uterus and RIGHT adnexa. LEFT ovary prominent size for age at 3.8 x 3.6 x 3.6 cm. Bladder decompressed by Foley catheter. Shotty retroperitoneal nodes without adenopathy. No mass, adenopathy, free air or free fluid. Scattered atherosclerotic calcifications. Bones demineralized. IMPRESSION: RIGHT hydronephrosis question UPJ obstruction; in the setting of leukocytosis, recommend correlation with urinalysis to exclude urinary tract infection. Scattered colonic diverticulosis. Prominent LEFT ovarian size for age ; non emergent followup pelvic and transvaginal sonography recommended to characterize. Electronically Signed   By: Lavonia Dana M.D.   On: 10/20/2015 12:50   Ct Head Wo Contrast  10/20/2015   CLINICAL DATA:  Found unresponsive today. EXAM: CT HEAD WITHOUT CONTRAST CT CERVICAL SPINE WITHOUT CONTRAST TECHNIQUE: Multidetector CT imaging of the head and cervical spine was performed following the standard protocol without intravenous contrast. Multiplanar CT image reconstructions of the cervical spine were also generated. COMPARISON:  None. FINDINGS: CT HEAD FINDINGS Normal appearing cerebral hemispheres and posterior fossa structures. Normal size and position of the ventricles. No intracranial hemorrhage, mass lesion or CT evidence of acute infarction. Unremarkable bones. Mild mucosal thickening involving all of the paranasal sinuses with a small amount of fluid in the sphenoid sinus bilaterally. CT CERVICAL SPINE FINDINGS An endotracheal tube is in place. Multilevel degenerative changes. Straightening of the normal cervical lordosis. No prevertebral soft tissue swelling, fractures or subluxations. Bilateral carotid artery calcifications. IMPRESSION: 1. No acute intracranial abnormality. 2. No cervical spine fracture or subluxation. 3. Mild chronic pansinusitis with an acute component in the sphenoid sinuses. 4. Cervical spine degenerative changes. 5. Bilateral carotid artery atheromatous calcifications. Electronically Signed   By: Claudie Revering M.D.   On: 10/20/2015 13:25   Ct Cervical Spine Wo Contrast  10/20/2015  CLINICAL DATA:  Found unresponsive today. EXAM: CT HEAD WITHOUT CONTRAST CT CERVICAL SPINE WITHOUT CONTRAST TECHNIQUE: Multidetector CT imaging of the head and cervical spine was performed following the standard protocol without intravenous contrast. Multiplanar CT image reconstructions of the cervical spine were also generated. COMPARISON:  None. FINDINGS: CT HEAD FINDINGS Normal appearing cerebral hemispheres and posterior fossa structures. Normal size and position of the ventricles. No intracranial hemorrhage, mass lesion or CT evidence of acute infarction. Unremarkable bones. Mild mucosal  thickening involving all of the paranasal sinuses with a small amount of fluid in the sphenoid sinus bilaterally. CT CERVICAL SPINE FINDINGS An endotracheal tube is in place. Multilevel degenerative  changes. Straightening of the normal cervical lordosis. No prevertebral soft tissue swelling, fractures or subluxations. Bilateral carotid artery calcifications. IMPRESSION: 1. No acute intracranial abnormality. 2. No cervical spine fracture or subluxation. 3. Mild chronic pansinusitis with an acute component in the sphenoid sinuses. 4. Cervical spine degenerative changes. 5. Bilateral carotid artery atheromatous calcifications. Electronically Signed   By: Claudie Revering M.D.   On: 10/20/2015 13:25   Dg Chest Port 1 View  10/20/2015  CLINICAL DATA:  Hypoxia. EXAM: PORTABLE CHEST 1 VIEW COMPARISON:  None. FINDINGS: Endotracheal tube tip is 4.5 cm above the carina. No pneumothorax. There is no edema or consolidation. Heart size and pulmonary vascularity within normal limits. No adenopathy. No bone lesions. IMPRESSION: Endotracheal tube as described without pneumothorax. No edema or consolidation. Electronically Signed   By: Lowella Grip III M.D.   On: 10/20/2015 10:53    EKG:   Mr. tachycardia  IMPRESSION AND PLAN:   April [name]  is a 70 y.o. female with no past medical history comes to the emergency room after she was found lying on the floor minimally responsive. Patient had not shown up to work for 2 days and coworkers checked on her and they found her lying on the floor. Down time not known. She was reportedly severely swollen at baseline by coworker. Patient was brought to the emergency room intubated on the ventilator.  1. Septic shock, hypovolemic shock -Source appears your urine -Patient intubated on the ventilator admitted to ICU -Recommend management per ICU intensivist -IV fentanyl drip -IV Levophed. Consider vasopressin given severe sepsis -IV vancomycin and Zosyn -Follow blood culture  urine culture and chest x-rays  2. Hypovolemic shock with acute renal failure Patient found unresponsive at home -Creatinine of 3 appears ATN in the setting of sepsis -Already has received 6 L of IV fluids continue maintenance fluids -Avoid nephrotoxins -Monitor I's and O's  3. Severe leukocytosis As above  4. Tachyarrhythmia secondary to #1 -Consider echocardiogram of the heart  5. Metabolic acidosis secondary to severe sepsis with elevated lactic acid of 6.6 Lactic acid down to just 5.9 continue to monitor.  6. Acute rhabdomyolysis secondary to patient being found down on the floor Continue aggressive IV hydration CK total 1562  7. DVT prophylaxis subcutaneous heparin  8. GI prophylaxis IV Protonix  All the records are reviewed and case discussed with ED provider. CODE STATUS: Full code  TOTAL  critical TIME TAKING CARE OF THIS PATIENT:55  minutes.    April [name] M.D on 10/20/2015 at 4:53 PM  Between 7am to 6pm - Pager - [phone]  After 6pm go to [email or web address] - password [email or web address] [facility]  Office  [phone]  CC: Primary care physician; April Billet, MD

## 2015-10-20 NOTE — ED Notes (Addendum)
Coworkers at bedside. Coworkers reported pt complaining about her hip and is unsure if she possibly took a fall.

## 2015-10-20 NOTE — ED Provider Notes (Signed)
[facility] Emergency Department Provider Note   ____________________________________________  Time seen: Approximately 10 AM, upon EMS arrival I have reviewed the triage vital signs and the triage nursing note.  HISTORY  Chief Complaint Altered Mental Status   Historian Limited as patient has altered mental status. History per EMS who picked her up at the scene  HPI April [name] is a 70 y.o. female with unknown past medical history was picked up by EMS today after being found at her house laying on the floor minimally responsive. Reportedly she hadn't shown up work for 2 days and when the friend/coworker checked on her they found her lying in the floor minimally responsive. She was reportedly severely swollen to her baseline according to the friend on scene.Other than this, no known antecedent factors.    No past medical history on file.  There are no active problems to display for this patient.   No past surgical history on file.  No current outpatient prescriptions on file.  Allergies Prednisone  No family history on file.  Social History Social History  Substance Use Topics  . Smoking status: Unknown If Ever Smoked  . Smokeless tobacco: Not on file  . Alcohol Use: Not on file    Review of Systems Limited/unknown as patient is altered. Constitutional: Eyes: . ENT:  Cardiovascular:  Respiratory: Gastrointestinal: . Genitourinary:  Musculoskeletal:  Skin:  Neurological:   ____________________________________________   PHYSICAL EXAM:  VITAL SIGNS: ED Triage Vitals  Enc Vitals Group     BP --      Pulse --      Resp --      Temp --      Temp src --      SpO2 --      Weight --      Height --      Head Cir --      Peak Flow --      Pain Score --      Pain Loc --      Pain Edu? --      Excl. in [place]? --      Constitutional: Occasional moaning. Not opening eyes. Question anasarca Eyes: Conjunctivae are normal. PERRL.  EYELIDS) try to open them, but does look like eyes are moving. ENT   Head: Normocephalic and atraumatic.   Nose: No congestion/rhinnorhea.   Mouth/Throat: Mucous membranes are severely dry.   Neck: No stridor. No step-offs to the cervical spine Cardiovascular/Chest: Tachycardic, regular. No murmurs, rubs, or gallops. Respiratory: Seems to be tachypneic. Mild rhonchi bilateral bases without wheezing or rales. Gastrointestinal: Soft. Obese, does not appear to be tender with palpation. Yeast at abominal skin folds left side Genitourinary/rectal: External exam normal. Musculoskeletal: Small ecchymosis/abrasion left knee Neurologic: Moving 4 extremities, no apparent sensory deficit, but limited ability to test.  Resists eye opening.  Some moaning.  Localizes to painful stimuli. Skin:  Skin is warm, dry and intact. Yeast rash abd folds   ____________________________________________   EKG I, Lisa Roca, MD, the attending physician have personally viewed and interpreted all ECGs.  126 bpm. Sinus tachycardia. Normal axis. Narrow QRS. Nonspecific ST and T-wave ____________________________________________  LABS (pertinent positives/negatives)  Initial lactate 6.6 Repeat Lactic acid 5.90 Urinalysis 2+ leukocytes, too numerous to count red blood cells and white blood cells, many bacteria Venous blood gas significant for pH 7.26, PCO2 44, bicarbonate 19.7 Conference metabolic panel significant for BUN 66 and creatinine 2.01 as well as AST 124, a LT  63, alkaline phosphatase 567, CO2 20 Alcohol less than 5 Lipase 11 Salicylate less than 4 Troponin 1.21 Tylenol less than 10 White blood count 50.5, hemoglobin 16.4, platelet count 62 INR 1.46  ____________________________________________  RADIOLOGY All Xrays were viewed by me. Imaging interpreted by Radiologist.  CXR:  IMPRESSION: Endotracheal tube as described without pneumothorax. No edema or consolidation.  CT abdomen  and pelvis with contrast:  IMPRESSION: RIGHT hydronephrosis question UPJ obstruction; in the setting of leukocytosis, recommend correlation with urinalysis to exclude urinary tract infection.  Scattered colonic diverticulosis.  Prominent LEFT ovarian size for age ; non emergent followup pelvic and transvaginal sonography recommended to characterize.  Ct head and cspine:  IMPRESSION: 1. No acute intracranial abnormality. 2. No cervical spine fracture or subluxation. 3. Mild chronic pansinusitis with an acute component in the sphenoid sinuses. 4. Cervical spine degenerative changes. 5. Bilateral carotid artery atheromatous calcifications. __________________________________________  PROCEDURES  Procedure(s) performed: INTUBATION Performed by: Lisa Roca  Required items: required blood products, implants, devices, and special equipment available Patient identity confirmed: provided demographic data and hospital-assigned identification number Time out: Immediately prior to procedure a "time out" was called to verify the correct patient, procedure, equipment, support staff and site/side marked as required.  Indications: Airway protection   Intubation method: Glidescope Laryngoscopy   Preoxygenation: BVM  Sedatives: 30 milligrams Etomidate Paralytic: 100 mg rocuronium   Tube Size: 7.5 cuffed  Post-procedure assessment: chest rise and ETCO2 monitor Breath sounds: equal and absent over the epigastrium Tube secured with: ETT holder Chest x-ray interpreted by radiologist and me.  Chest x-ray findings: endotracheal tube in appropriate position  Patient tolerated the procedure well with no immediate complications.     Critical Care performed: CRITICAL CARE Performed by: Lisa Roca   Total critical care time: 70 minutes  Critical care time was exclusive of separately billable procedures and treating other patients.  Critical care was necessary to treat or prevent  imminent or life-threatening deterioration.  Critical care was time spent personally by me on the following activities: development of treatment plan with patient and/or surrogate as well as nursing, discussions with consultants, evaluation of patient's response to treatment, examination of patient, obtaining history from patient or surrogate, ordering and performing treatments and interventions, ordering and review of laboratory studies, ordering and review of radiographic studies, pulse oximetry and re-evaluation of patient's condition.   ____________________________________________   ED COURSE / ASSESSMENT AND PLAN  Pertinent labs & imaging results that were available during my care of the patient were reviewed by me and considered in my medical decision making (see chart for details).   Patient altered on arrival also diffusely swollen, and last seen normal multiple days ago. Although she doesn't look like she has clear evidence of trauma and I suspect likely more metabolic cause, I did place her in a c-collar and plan to get a CT of the head and C-spine. She looks severely dehydrated and I would suspect she is in renal failure. Foley catheter was placed for strict I&O measurements.  2 L normal saline started. Her initial fingerstick blood sugar was in the 100s.  No known report of trauma, however she was found off the bed and so a c-collar was placed.  Patient initially fairly tachycardic, but with a good blood pressure. Her respiratory rate was increased and started to have trouble with agitation. I feel like her GCS less than 8, her airway was not protected from aspiration. In addition she needed some sedation in order to  facilitate the rest of her evaluation and treatment. I chose to go ahead and proceed with intubation.   After intubation, patient's blood pressure did drop and after 4 L bolus she was still hypertensive and so Levophed was started. Her blood pressure did respond to  that.    Patient did have an 18-gauge EJ to the left neck which I placed. She also had a another 18-gauge peripheral addition to 3 20-gauge. She has good IV access.  Foley catheter was placed and urine was found to be positive for urinary tract infection. I suspected this time she is feeling with urosepsis. Patient twice and Zosyn were started. Elevated white blood cell count.  She is also not been acute renal failure. She is also found to have elevated troponin consistent with an nSTEMI. Patient was started on bolus and drip of heparin.  Nephew was contacted by patient care advocate and obtain permission for friends who found her to be at bedside with updates.  Discussed with hospitalist Dr. Posey Pronto for ICU admission.  CONSULTATIONS:   Hospitalist for admission.    Patient / Family / Caregiver informed of clinical course, medical decision-making process, and agree with plan.  ___________________________________________   FINAL CLINICAL IMPRESSION(S) / ED DIAGNOSES   Final diagnoses:  Altered mental status, unspecified altered mental status type  Sepsis due to urinary tract infection ([place])  Acute renal failure, unspecified acute renal failure type [facility])  NSTEMI (non-ST elevated myocardial infarction) [facility])              Note: This dictation was prepared with Dragon dictation. Any transcriptional errors that result from this process are unintentional   Lisa Roca, MD 10/20/15 [phone]

## 2015-10-20 NOTE — Progress Notes (Signed)
MEDICATION RELATED CONSULT NOTE - INITIAL   Pharmacy Consult for CRRT medication adjustments Indication: CRRT dosing   Allergies  Allergen Reactions  . Prednisone Anaphylaxis    Patient Measurements: Height: '5\' 7"'$  (170.2 cm) Weight: 249 lb 1.9 oz (113 kg) IBW/kg (Calculated) : 61.6 Adjusted Body Weight:   Vital Signs: Temp: 99.9 F (37.7 C) (01/24 2000) Temp Source: Core (Comment) (01/24 1800) BP: 122/63 mmHg (01/24 2000) Pulse Rate: 145 (01/24 2000) Intake/Output from previous day:   Intake/Output from this shift:    Labs:  Recent Labs  10/20/15 0957  WBC 50.5*  HGB 16.4*  HCT 49.9*  PLT 62*  APTT 35  CREATININE 2.95*  3.01*  ALBUMIN 3.1*  PROT 7.4  AST 124*  ALT 63*  ALKPHOS 567*  BILITOT 2.5*   Estimated Creatinine Clearance: 22.9 mL/min (by C-G formula based on Cr of 3.01).   Microbiology: Recent Results (from the past 720 hour(s))  Urine culture     Status: None (Preliminary result)   Collection Time: 10/20/15  9:57 AM  Result Value Ref Range Status   Specimen Description URINE, RANDOM  Final   Special Requests NONE  Final   Culture NO GROWTH < 12 HOURS  Final   Report Status PENDING  Incomplete  Culture, blood (routine x 2)     Status: None (Preliminary result)   Collection Time: 10/20/15  9:57 AM  Result Value Ref Range Status   Specimen Description BLOOD RIGHT WRIST  Final   Special Requests   Final    BOTTLES DRAWN AEROBIC AND ANAEROBIC ANA 1ML AER 4ML   Culture NO GROWTH < 12 HOURS  Final   Report Status PENDING  Incomplete  Culture, blood (routine x 2)     Status: None (Preliminary result)   Collection Time: 10/20/15  9:57 AM  Result Value Ref Range Status   Specimen Description BLOOD LEFT HAND  Final   Special Requests   Final    BOTTLES DRAWN AEROBIC AND ANAEROBIC AER 3ML ANA 2ML   Culture NO GROWTH < 12 HOURS  Final   Report Status PENDING  Incomplete    Medical History: No past medical history on file.  Medications:  No  prescriptions prior to admission   Scheduled:  . fentaNYL      . heparin  4,000 Units Intravenous Once  . midazolam      . pantoprazole (PROTONIX) IV  40 mg Intravenous QHS  . piperacillin-tazobactam (ZOSYN)  IV  3.375 g Intravenous 3 times per day    Assessment: Pharmacy consulted to review medications daily for any opportunity to renally adjust doses in this 70 year old female patient receiving CRRT  Goal of Therapy:    Plan:  Currently all medications are renally adjusted for CRRT   Foy Vanduyne D 10/20/2015,8:14 PM

## 2015-10-20 NOTE — Procedures (Signed)
  Procedure Note: RIGHT IJ VasCath Placement Melene Plan , [id number] , [id number]-[id number]  Indications: Hemodynamic monitoring / Intravenous access / CRRT EMERGENT PLACEMENT A time-out was completed verifying correct patient, procedure and site.  A 2 lumen catheter available at the time of procedure.  The patient was placed in a dependent position appropriate for central line placement based on the vein to be cannulated.   The patient's RIGHT Internal Jugular Vein/Femoral Vein was prepped and draped in a sterile fashion.  1% Lidocaine WAS NOT used to anesthetize the surrounding skin area.   A 2 lumen catheter was introduced into the RIGHT Internal Jugular Vein/Femoral Vein using Seldinger technique, visualized under ultrasound.  The catheter was threaded smoothly over the guide wire and appropriate blood return was obtained.  Each lumen of the catheter was evacuated of air and flushed with sterile saline.  The catheter was then sutured in place to the skin and a sterile dressing applied.  Perfusion to the extremity distal to the point of catheter insertion was checked and found to be adequate.    Chest X-ray was ordered for confirmation of placement.  The patient tolerated the procedure well and there were no complications.  Vilinda Boehringer, MD [facility] Pager [phone] (please enter 7-digits) On Call Pager - [phone] (please enter 7-digits)

## 2015-10-20 NOTE — ED Notes (Signed)
Lactic acid 5.9. MD notified.

## 2015-10-20 NOTE — Consult Note (Signed)
[facility]  CONSULT NOTE    Date: 10/20/2015                  Patient Name:  April [name]  MRN: [id number]  DOB: October 24, 1945  Age / Sex: 70 y.o., female         PCP: Albina Billet, MD                 Service Requesting Consult: Dr. Stevenson Clinch                 Reason for Consult: Acute renal failure            History of Present Illness: April [name] is a 70 y.o. white  female with unknown past medications history, who was admitted to [facility] on 10/20/2015 for NSTEMI (non-ST elevated myocardial infarction) ([place]) [I21.4] Sepsis due to urinary tract infection ([place]) [A41.9, N39.0] Acute renal failure, unspecified acute renal failure type ([place]) [N17.9] Altered mental status, unspecified altered mental status type [R41.82]  Patient was sick and did not show up to work for 2 days. Then coworkers went to her home to check on her. She was found down unresponsive to minimally responsive. She appeared edematous.  Brought to ED where she required intubation and mechanical ventilation.  Nephrology consulted for anuric urine output despite over 6 litres of saline given. She is now on norepinephrine gtt at 78mg/min. Also on amiodarone gtt.    Medications: Outpatient medications: No prescriptions prior to admission    Current medications: Current Facility-Administered Medications  Medication Dose Route Frequency Provider Last Rate Last Dose  . 0.9 %  sodium chloride infusion   Intravenous Continuous VVilinda Boehringer MD 125 mL/hr at 10/20/15 1927    . acetaminophen (TYLENOL) tablet 650 mg  650 mg Oral Q6H PRN SFritzi Mandes MD       Or  . acetaminophen (TYLENOL) suppository 650 mg  650 mg Rectal Q6H PRN SFritzi Mandes MD      . amiodarone (NEXTERONE) 1.8 mg/mL load via infusion 150 mg  150 mg Intravenous Once Will MMeredith Leeds MD       Followed by  . amiodarone (NEXTERONE PREMIX) 360 MG/200ML (1.8 mg/mL) IV infusion  60 mg/hr Intravenous Continuous Will MMeredith Leeds MD        Followed by  . [START ON 10/21/2015] amiodarone (NEXTERONE PREMIX) 360 MG/200ML (1.8 mg/mL) IV infusion  30 mg/hr Intravenous Continuous Will MMeredith Leeds MD      . fentaNYL (SUBLIMAZE) 100 MCG/2ML injection        100 mcg at 10/20/15 1816  . fentaNYL 25042m in NS 25011m35m52ml) infusion-PREMIX  10 mcg/hr Intravenous Continuous RebeLisa Roca 10 mL/hr at 10/20/15 1927 100 mcg/hr at 10/20/15 1927  . heparin ADULT infusion 100 units/mL (25000 units/250 mL)  1,200 Units/hr Intravenous Continuous SonaFritzi Mandes      . heparin bolus via infusion 4,000 Units  4,000 Units Intravenous Once SonaFritzi Mandes      . midazolam (VERSED) 2 MG/2ML injection        2 mg at 10/20/15 1816  . midazolam (VERSED) 50 mg in sodium chloride 0.9 % 50 mL (1 mg/mL) infusion  1 mg/hr Intravenous Continuous RebeLisa Roca   Stopped at 10/20/15 1045  . norepinephrine (LEVOPHED) 16 mg in dextrose 5 % 250 mL (0.064 mg/mL) infusion  0-40 mcg/min Intravenous Titrated Vishal Mungal, MD 18.8 mL/hr at 10/20/15 1853 20 mcg/min at 10/20/15 1853  . ondansetron (  ZOFRAN) tablet 4 mg  4 mg Oral Q6H PRN Fritzi Mandes, MD       Or  . ondansetron (ZOFRAN) injection 4 mg  4 mg Intravenous Q6H PRN Fritzi Mandes, MD      . pantoprazole (PROTONIX) injection 40 mg  40 mg Intravenous QHS Vishal Mungal, MD      . piperacillin-tazobactam (ZOSYN) IVPB 3.375 g  3.375 g Intravenous 3 times per day Fritzi Mandes, MD          Allergies: Allergies  Allergen Reactions  . Prednisone Anaphylaxis      Past Medical History: No past medical history on file.   Past Surgical History: No past surgical history on file.   Family History: No family history on file.   Social History: Social History   Social History  . Marital Status: Single    Spouse Name: N/A  . Number of Children: N/A  . Years of Education: N/A   Occupational History  . Not on file.   Social History Main Topics  . Smoking status: Unknown If Ever Smoked  . Smokeless  tobacco: Not on file  . Alcohol Use: Not on file  . Drug Use: Not on file  . Sexual Activity: Not on file   Other Topics Concern  . Not on file   Social History Narrative  . No narrative on file     Review of Systems: Review of Systems  Unable to perform ROS: critical illness    Vital Signs: Blood pressure 129/81, pulse 166, temperature 99.9 F (37.7 C), temperature source Core (Comment), resp. rate 28, height '5\' 7"'$  (1.702 m), weight 113 kg (249 lb 1.9 oz), SpO2 97 %.  Weight trends: Filed Weights   10/20/15 1022 10/20/15 1700  Weight: 113.309 kg (249 lb 12.8 oz) 113 kg (249 lb 1.9 oz)    Physical Exam: General: Critically ill, intubated, sedated  Head: +ETT  Eyes: Eyes closed, reactive to light, +icterus  Neck: RIJ temp HD catheter, L external jugular peripheral line  Lungs:  PRVC FiO2 100%  Heart: Irregular rhythm, tachycardia  Abdomen:  Soft, nontender, obese  Extremities: 1+ peripheral edema.  Neurologic: Sedated, intubated  Skin: No lesions  Access: RIJ vascath 10/20/15 Dr. Stevenson Clinch     Lab results: Basic Metabolic Panel:  Recent Labs Lab 10/20/15 0957  NA 140  K 3.9  CL 101  CO2 20*  GLUCOSE 185*  BUN 66*  CREATININE 2.95*  3.01*  CALCIUM 9.0    Liver Function Tests:  Recent Labs Lab 10/20/15 0957  AST 124*  ALT 63*  ALKPHOS 567*  BILITOT 2.5*  PROT 7.4  ALBUMIN 3.1*    Recent Labs Lab 10/20/15 0957  LIPASE 11    Recent Labs Lab 10/20/15 0957  AMMONIA 34    CBC:  Recent Labs Lab 10/20/15 0957  WBC 50.5*  NEUTROABS 47.0*  HGB 16.4*  HCT 49.9*  MCV 90.2  PLT 62*    Cardiac Enzymes:  Recent Labs Lab 10/20/15 0957 10/20/15 1010  CKTOTAL  --  1562*  TROPONINI 1.21*  --     BNP: Invalid input(s): POCBNP  CBG: No results for input(s): GLUCAP in the last 168 hours.  Microbiology: Results for orders placed or performed during the hospital encounter of 10/20/15  Urine culture     Status: None (Preliminary  result)   Collection Time: 10/20/15  9:57 AM  Result Value Ref Range Status   Specimen Description URINE, RANDOM  Final   Special Requests  NONE  Final   Culture NO GROWTH < 12 HOURS  Final   Report Status PENDING  Incomplete  Culture, blood (routine x 2)     Status: None (Preliminary result)   Collection Time: 10/20/15  9:57 AM  Result Value Ref Range Status   Specimen Description BLOOD RIGHT WRIST  Final   Special Requests   Final    BOTTLES DRAWN AEROBIC AND ANAEROBIC ANA 1ML AER 4ML   Culture NO GROWTH < 12 HOURS  Final   Report Status PENDING  Incomplete  Culture, blood (routine x 2)     Status: None (Preliminary result)   Collection Time: 10/20/15  9:57 AM  Result Value Ref Range Status   Specimen Description BLOOD LEFT HAND  Final   Special Requests   Final    BOTTLES DRAWN AEROBIC AND ANAEROBIC AER 3ML ANA 2ML   Culture NO GROWTH < 12 HOURS  Final   Report Status PENDING  Incomplete    Coagulation Studies:  Recent Labs  10/20/15 0957  LABPROT 17.8*  INR 1.46    Urinalysis:  Recent Labs  10/20/15 0957  COLORURINE RED*  LABSPEC 1.017  PHURINE 6.0  GLUCOSEU NEGATIVE  HGBUR 3+*  BILIRUBINUR NEGATIVE  KETONESUR TRACE*  PROTEINUR 100*  NITRITE NEGATIVE  LEUKOCYTESUR 2+*      Imaging: Ct Abdomen Pelvis Wo Contrast  10/20/2015  CLINICAL DATA:  Patient had not been hurt from for couple days, was found by EMS between the wall and the bed, responsive to pain, leukocytosis, elevated glucose and creatinine, abdominal swelling, pelvic pain EXAM: CT ABDOMEN AND PELVIS WITHOUT CONTRAST TECHNIQUE: Multidetector CT imaging of the abdomen and pelvis was performed following the standard protocol without IV contrast. Sagittal and coronal MPR images reconstructed from axial data set. COMPARISON:  None FINDINGS: Bibasilar atelectasis. Calcifications at LEFT lung base may represent calcified granuloma within atelectatic lower lobe. Marked RIGHT hydronephrosis without definite  ureteral calcification or dilatation. Edema surrounding the RIGHT kidney can be related to obstruction. Small nonobstructing calculus 4 mm diameter RIGHT kidney image 51. Beam hardening artifacts from patient's arms traverse upper abdomen. Within limits of noncontrast technique and beam hardening artifacts, no definite additional abnormalities of the liver, spleen, atrophic pancreas, or kidneys. BILATERAL thickened adrenal glands without discrete mass. Gallbladder distended without mass or surrounding inflammatory changes. Extensive colonic diverticulosis without evidence of diverticulitis. Stomach and remaining bowel loops normal appearance. Small umbilical hernia containing fat. Normal appendix. Unremarkable uterus and RIGHT adnexa. LEFT ovary prominent size for age at 3.8 x 3.6 x 3.6 cm. Bladder decompressed by Foley catheter. Shotty retroperitoneal nodes without adenopathy. No mass, adenopathy, free air or free fluid. Scattered atherosclerotic calcifications. Bones demineralized. IMPRESSION: RIGHT hydronephrosis question UPJ obstruction; in the setting of leukocytosis, recommend correlation with urinalysis to exclude urinary tract infection. Scattered colonic diverticulosis. Prominent LEFT ovarian size for age ; non emergent followup pelvic and transvaginal sonography recommended to characterize. Electronically Signed   By: Lavonia Dana M.D.   On: 10/20/2015 12:50   Dg Chest 1 View  10/20/2015  CLINICAL DATA:  Patient found down and minimally responsive 10/20/2015. Status post central line placement. EXAM: CHEST 1 VIEW COMPARISON:  Single view of the chest earlier today. FINDINGS: A new right IJ catheter is in place with the tip projecting over the lower superior vena cava. There is no pneumothorax. Endotracheal tube is again seen. Defibrillator pad is in place. There is no pneumothorax. Small left effusion and basilar atelectasis are identified. Mild interstitial  edema. IMPRESSION: Right IJ catheter tip  projects over the lower superior vena cava. Negative for pneumothorax. Mild interstitial edema with a small left effusion and basilar atelectasis. Electronically Signed   By: Inge Rise M.D.   On: 10/20/2015 18:46   Ct Head Wo Contrast  10/20/2015  CLINICAL DATA:  Found unresponsive today. EXAM: CT HEAD WITHOUT CONTRAST CT CERVICAL SPINE WITHOUT CONTRAST TECHNIQUE: Multidetector CT imaging of the head and cervical spine was performed following the standard protocol without intravenous contrast. Multiplanar CT image reconstructions of the cervical spine were also generated. COMPARISON:  None. FINDINGS: CT HEAD FINDINGS Normal appearing cerebral hemispheres and posterior fossa structures. Normal size and position of the ventricles. No intracranial hemorrhage, mass lesion or CT evidence of acute infarction. Unremarkable bones. Mild mucosal thickening involving all of the paranasal sinuses with a small amount of fluid in the sphenoid sinus bilaterally. CT CERVICAL SPINE FINDINGS An endotracheal tube is in place. Multilevel degenerative changes. Straightening of the normal cervical lordosis. No prevertebral soft tissue swelling, fractures or subluxations. Bilateral carotid artery calcifications. IMPRESSION: 1. No acute intracranial abnormality. 2. No cervical spine fracture or subluxation. 3. Mild chronic pansinusitis with an acute component in the sphenoid sinuses. 4. Cervical spine degenerative changes. 5. Bilateral carotid artery atheromatous calcifications. Electronically Signed   By: Claudie Revering M.D.   On: 10/20/2015 13:25   Ct Cervical Spine Wo Contrast  10/20/2015  CLINICAL DATA:  Found unresponsive today. EXAM: CT HEAD WITHOUT CONTRAST CT CERVICAL SPINE WITHOUT CONTRAST TECHNIQUE: Multidetector CT imaging of the head and cervical spine was performed following the standard protocol without intravenous contrast. Multiplanar CT image reconstructions of the cervical spine were also generated. COMPARISON:   None. FINDINGS: CT HEAD FINDINGS Normal appearing cerebral hemispheres and posterior fossa structures. Normal size and position of the ventricles. No intracranial hemorrhage, mass lesion or CT evidence of acute infarction. Unremarkable bones. Mild mucosal thickening involving all of the paranasal sinuses with a small amount of fluid in the sphenoid sinus bilaterally. CT CERVICAL SPINE FINDINGS An endotracheal tube is in place. Multilevel degenerative changes. Straightening of the normal cervical lordosis. No prevertebral soft tissue swelling, fractures or subluxations. Bilateral carotid artery calcifications. IMPRESSION: 1. No acute intracranial abnormality. 2. No cervical spine fracture or subluxation. 3. Mild chronic pansinusitis with an acute component in the sphenoid sinuses. 4. Cervical spine degenerative changes. 5. Bilateral carotid artery atheromatous calcifications. Electronically Signed   By: Claudie Revering M.D.   On: 10/20/2015 13:25   Dg Chest Port 1 View  10/20/2015  CLINICAL DATA:  Hypoxia. EXAM: PORTABLE CHEST 1 VIEW COMPARISON:  None. FINDINGS: Endotracheal tube tip is 4.5 cm above the carina. No pneumothorax. There is no edema or consolidation. Heart size and pulmonary vascularity within normal limits. No adenopathy. No bone lesions. IMPRESSION: Endotracheal tube as described without pneumothorax. No edema or consolidation. Electronically Signed   By: Lowella Grip III M.D.   On: 10/20/2015 10:53      Assessment & Plan: April [name] is a 70 y.o. white  female with unknown past medications history, who was admitted to [facility] on 10/20/2015  1. Acute renal failure with metabolic acidosis: anuric. Hemodynamically unstable on vasopressors. Unknown baseline creatinine.  CT with right sided perinephric edema.  Acute renal failure from sepsis and rhabdomyolysis.  - Urgent need for renal replacement therapy. Will initiate CRRT tonight.  - Orders prepared. 4K bath 2K DFR BFR 250 - monitor  volume status, urine output, electrolytes and renal function -  discontinue IV fluids - place foley catheter - check renal ultrasound  2. Sepsis/hypotension: wbc >50K. Afebrile on presentation. Urine and blood cultures sent. CXR without infiltrate.  - Norepinephrine - empiric pip/tazo and vanco  3. Tachycardia: SVT.  - echocardiogram - amiodarone started.    LOS: 0 Vernetta Dizdarevic 1/24/20177:43 PM

## 2015-10-20 NOTE — Progress Notes (Addendum)
ANTICOAGULATION CONSULT NOTE - Initial Consult  Pharmacy Consult for Heparin Indication: NSTEMI  Allergies  Allergen Reactions  . Prednisone Anaphylaxis    Patient Measurements: Weight: 249 lb 12.8 oz (113.309 kg)  Estimated height= 68 inches Heparin Dosing Weight: 90 kg  Vital Signs: Temp: 97.7 F (36.5 C) (01/24 1315) Temp Source: Temporal (01/24 1022) BP: 100/68 mmHg (01/24 1315) Pulse Rate: 164 (01/24 1315)  Labs:  Recent Labs  10/20/15 0957  HGB 16.4*  HCT 49.9*  PLT 62*  LABPROT 17.8*  INR 1.46  CREATININE 3.01*  TROPONINI 1.21*    CrCl cannot be calculated (Unknown ideal weight.).   Medical History: No past medical history on file.  Medications:  Scheduled:  . heparin  4,000 Units Intravenous Once   Infusions:  . fentaNYL infusion INTRAVENOUS Stopped (10/20/15 1229)  . heparin    . midazolam (VERSED) infusion Stopped (10/20/15 1045)    Assessment: 70 y/o F admitted after being found unresponsive. Patient with sepsis/NSTEMI. No PMH or PTA meds known at this time. INR is acceptable for bolus. Will go ahead and order baseline HL and APTT due to unkown medication history. Height is estimated. No family members available and patient is intubated.   Goal of Therapy:  Heparin level 0.3-0.7 units/ml Monitor platelets by anticoagulation protocol: Yes   Plan:  Give 4000 units bolus x 1 Start heparin infusion at 1200 units/hr Check anti-Xa level in 8 hours and daily while on heparin Continue to monitor H&H and platelets  Ulice Dash D 10/20/2015,2:25 PM

## 2015-10-20 NOTE — ED Notes (Signed)
CRITICAL VALUE ALERT  Critical value received:  50.5 WBC  Date of notification:  10/20/2015  Time of notification:  10:33am  Critical value read back:Yes.    Nurse who received alert:  Waymon Amato  MD notified (1st page):  Dr. Reita Cliche  Time of first page:  10:33  MD notified (2nd page):  Time of second page:  Responding MD:  Dr.lord  Time MD responded:  10:33

## 2015-10-20 NOTE — Progress Notes (Signed)
RT called to room for intubation.  Patient preoxygenated with BVM, intubated by Dr. Reita Cliche on 1st attempt with 7.5 ETT at 21 at lip using #3blade with glidoscope.  Patient ETT secured at 21 at lip, equal BBS, placed on trilogy vent AC 500, 18, 100%, no PEEP due to low BP at this time.  Will continue to monitor.

## 2015-10-20 NOTE — Progress Notes (Addendum)
[facility] Progress Note Patient Name: April [name] DOB: Apr 24, 1946 MRN: [id number]   Date of Service  10/20/2015  HPI/Events of Note  Multiple issues: 1. Remains in AFIB with RVR and 2. Remains hypotensive - SBP - 70's. ABG - pH improved to 7.33 and Hgb = 13.6. Unable to place CVL, therefore, no CVP or COOX  available.   eICU Interventions  Will order: 1. Increase ceiling on Norepinephrine IV infusion to 70 mcg/min. 2. Bolus with Amiodarone 150 mg IV over 10 minutes now.      Intervention Category Major Interventions: Hypotension - evaluation and management;Arrhythmia - evaluation and management  Sommer,Steven Eugene 10/20/2015, 11:15 PM

## 2015-10-20 NOTE — ED Notes (Signed)
Spoke with pt's uncle Deneen Harts who gave permission for Sharee Pimple & Aris Lot and Evlyn Clines to be able to see pt and obtain medical information about pt.

## 2015-10-20 NOTE — Progress Notes (Signed)
Medically patient's uncle, job Librarian, academic, coworker and explained the critical illness with patient All questions answered Family understands patient is critically ill and there is a possibility she may not survive this hospitalization. Patient remains a full code. Critical time 20 minutes.

## 2015-10-21 ENCOUNTER — Inpatient Hospital Stay (HOSPITAL_COMMUNITY)
Admit: 2015-10-21 | Discharge: 2015-10-21 | Disposition: A | Discharge status: Home / Self Care | Payer: Medicare HMO | Attending: Nephrology | Admitting: Nephrology

## 2015-10-21 ENCOUNTER — Inpatient Hospital Stay: Payer: Medicare HMO

## 2015-10-21 ENCOUNTER — Encounter: Payer: Self-pay | Admitting: Critical Care Medicine

## 2015-10-21 DIAGNOSIS — Z789 Other specified health status: Secondary | ICD-10-CM

## 2015-10-21 DIAGNOSIS — N1330 Unspecified hydronephrosis: Secondary | ICD-10-CM

## 2015-10-21 DIAGNOSIS — I959 Hypotension, unspecified: Secondary | ICD-10-CM | POA: Insufficient documentation

## 2015-10-21 DIAGNOSIS — I4891 Unspecified atrial fibrillation: Secondary | ICD-10-CM

## 2015-10-21 DIAGNOSIS — R0603 Acute respiratory distress: Secondary | ICD-10-CM | POA: Insufficient documentation

## 2015-10-21 DIAGNOSIS — R0600 Dyspnea, unspecified: Secondary | ICD-10-CM

## 2015-10-21 DIAGNOSIS — Z978 Presence of other specified devices: Secondary | ICD-10-CM | POA: Insufficient documentation

## 2015-10-21 DIAGNOSIS — J9621 Acute and chronic respiratory failure with hypoxia: Secondary | ICD-10-CM

## 2015-10-21 DIAGNOSIS — N179 Acute kidney failure, unspecified: Secondary | ICD-10-CM | POA: Insufficient documentation

## 2015-10-21 LAB — PHOSPHORUS: Phosphorus: 3.1 mg/dL (ref 2.5–4.6)

## 2015-10-21 LAB — GLUCOSE, CAPILLARY
GLUCOSE-CAPILLARY: 206 mg/dL — AB (ref 65–99)
GLUCOSE-CAPILLARY: 233 mg/dL — AB (ref 65–99)
GLUCOSE-CAPILLARY: 317 mg/dL — AB (ref 65–99)
Glucose-Capillary: 271 mg/dL — ABNORMAL HIGH (ref 65–99)

## 2015-10-21 LAB — TROPONIN I
TROPONIN I: 1.08 ng/mL — AB (ref <0.031)
Troponin I: 0.44 ng/mL — ABNORMAL HIGH (ref <0.031)

## 2015-10-21 LAB — RENAL FUNCTION PANEL
Albumin: 1.9 g/dL — ABNORMAL LOW (ref 3.5–5.0)
Anion gap: 12 (ref 5–15)
BUN: 34 mg/dL — AB (ref 6–20)
CHLORIDE: 101 mmol/L (ref 101–111)
CO2: 23 mmol/L (ref 22–32)
Calcium: 7.6 mg/dL — ABNORMAL LOW (ref 8.9–10.3)
Creatinine, Ser: 1.59 mg/dL — ABNORMAL HIGH (ref 0.44–1.00)
GFR calc Af Amer: 37 mL/min — ABNORMAL LOW (ref >60)
GFR, EST NON AFRICAN AMERICAN: 32 mL/min — AB (ref >60)
Glucose, Bld: 317 mg/dL — ABNORMAL HIGH (ref 65–99)
POTASSIUM: 4.2 mmol/L (ref 3.5–5.1)
Phosphorus: 2.3 mg/dL — ABNORMAL LOW (ref 2.5–4.6)
Sodium: 136 mmol/L (ref 135–145)

## 2015-10-21 LAB — COMPREHENSIVE METABOLIC PANEL
ALT: 176 U/L — AB (ref 14–54)
AST: 312 U/L — AB (ref 15–41)
Albumin: 2.1 g/dL — ABNORMAL LOW (ref 3.5–5.0)
Alkaline Phosphatase: 391 U/L — ABNORMAL HIGH (ref 38–126)
Anion gap: 14 (ref 5–15)
BUN: 45 mg/dL — AB (ref 6–20)
CHLORIDE: 103 mmol/L (ref 101–111)
CO2: 18 mmol/L — AB (ref 22–32)
CREATININE: 1.87 mg/dL — AB (ref 0.44–1.00)
Calcium: 7.3 mg/dL — ABNORMAL LOW (ref 8.9–10.3)
GFR calc Af Amer: 31 mL/min — ABNORMAL LOW (ref >60)
GFR calc non Af Amer: 26 mL/min — ABNORMAL LOW (ref >60)
Glucose, Bld: 263 mg/dL — ABNORMAL HIGH (ref 65–99)
Potassium: 3.9 mmol/L (ref 3.5–5.1)
SODIUM: 135 mmol/L (ref 135–145)
Total Bilirubin: 3.1 mg/dL — ABNORMAL HIGH (ref 0.3–1.2)
Total Protein: 5.3 g/dL — ABNORMAL LOW (ref 6.5–8.1)

## 2015-10-21 LAB — BLOOD GAS, ARTERIAL
ACID-BASE DEFICIT: 6.4 mmol/L — AB (ref 0.0–2.0)
Allens test (pass/fail): POSITIVE — AB
BICARBONATE: 18.2 meq/L — AB (ref 21.0–28.0)
FIO2: 0.8
MECHVT: 500 mL
O2 SAT: 99.6 %
PCO2 ART: 33 mmHg (ref 32.0–48.0)
PH ART: 7.35 (ref 7.350–7.450)
PIP: 5 cmH2O
Patient temperature: 37
RATE: 30 resp/min
pO2, Arterial: 193 mmHg — ABNORMAL HIGH (ref 83.0–108.0)

## 2015-10-21 LAB — BLOOD CULTURE ID PANEL (REFLEXED)
ACINETOBACTER BAUMANNII: NOT DETECTED
CANDIDA ALBICANS: NOT DETECTED
CANDIDA KRUSEI: NOT DETECTED
CANDIDA PARAPSILOSIS: NOT DETECTED
Candida glabrata: NOT DETECTED
Candida tropicalis: NOT DETECTED
Carbapenem resistance: NOT DETECTED
ENTEROCOCCUS SPECIES: NOT DETECTED
ESCHERICHIA COLI: NOT DETECTED
Enterobacter cloacae complex: NOT DETECTED
Enterobacteriaceae species: DETECTED — AB
Haemophilus influenzae: NOT DETECTED
KLEBSIELLA OXYTOCA: NOT DETECTED
Klebsiella pneumoniae: NOT DETECTED
LISTERIA MONOCYTOGENES: NOT DETECTED
Methicillin resistance: NOT DETECTED
Neisseria meningitidis: NOT DETECTED
Pseudomonas aeruginosa: NOT DETECTED
SERRATIA MARCESCENS: NOT DETECTED
STREPTOCOCCUS AGALACTIAE: NOT DETECTED
STREPTOCOCCUS PYOGENES: NOT DETECTED
STREPTOCOCCUS SPECIES: NOT DETECTED
Staphylococcus aureus (BCID): NOT DETECTED
Staphylococcus species: NOT DETECTED
Streptococcus pneumoniae: NOT DETECTED
Vancomycin resistance: NOT DETECTED

## 2015-10-21 LAB — CBC
HCT: 40.2 % (ref 35.0–47.0)
Hemoglobin: 13.3 g/dL (ref 12.0–16.0)
MCH: 30 pg (ref 26.0–34.0)
MCHC: 33 g/dL (ref 32.0–36.0)
MCV: 90.8 fL (ref 80.0–100.0)
PLATELETS: 23 10*3/uL — AB (ref 150–440)
RBC: 4.43 MIL/uL (ref 3.80–5.20)
RDW: 14.5 % (ref 11.5–14.5)
WBC: 38.8 10*3/uL — ABNORMAL HIGH (ref 3.6–11.0)

## 2015-10-21 LAB — PROTIME-INR
INR: 1.52
Prothrombin Time: 18.4 seconds — ABNORMAL HIGH (ref 11.4–15.0)

## 2015-10-21 LAB — CK: Total CK: 779 U/L — ABNORMAL HIGH (ref 38–234)

## 2015-10-21 LAB — LACTIC ACID, PLASMA
Lactic Acid, Venous: 3.7 mmol/L (ref 0.5–2.0)
Lactic Acid, Venous: 2.9 mmol/L (ref 0.5–2.0)

## 2015-10-21 LAB — HEPARIN LEVEL (UNFRACTIONATED): Heparin Unfractionated: <0.1 IU/mL — ABNORMAL LOW (ref 0.30–0.70)

## 2015-10-21 LAB — MAGNESIUM: Magnesium: 1.5 mg/dL — ABNORMAL LOW (ref 1.7–2.4)

## 2015-10-21 LAB — FIBRINOGEN: Fibrinogen: >750 mg/dL — ABNORMAL HIGH (ref 210–470)

## 2015-10-21 MED ORDER — AMIODARONE HCL IN DEXTROSE 360-4.14 MG/200ML-% IV SOLN
60.0000 mg/h | INTRAVENOUS | Status: DC
  Administered 2015-10-21 – 2015-10-23 (×8): 60 mg/h via INTRAVENOUS
  Filled 2015-10-21 (×17): qty 200

## 2015-10-21 MED ORDER — HYDROCORTISONE NA SUCCINATE PF 100 MG IJ SOLR
50.0000 mg | Freq: Four times a day (QID) | INTRAMUSCULAR | Status: AC
  Administered 2015-10-21 – 2015-10-24 (×12): 50 mg via INTRAVENOUS
  Filled 2015-10-21 (×12): qty 2

## 2015-10-21 MED ORDER — DEXTROSE 5 % IV SOLN
2.0000 g | INTRAVENOUS | Status: DC
  Administered 2015-10-22: 2 g via INTRAVENOUS
  Filled 2015-10-21 (×2): qty 2

## 2015-10-21 MED ORDER — DEXTROSE 5 % IV SOLN
2.0000 g | Freq: Once | INTRAVENOUS | Status: AC
  Administered 2015-10-21: 2 g via INTRAVENOUS
  Filled 2015-10-21: qty 2

## 2015-10-21 MED ORDER — HEPARIN BOLUS VIA INFUSION
2700.0000 [IU] | Freq: Once | INTRAVENOUS | Status: AC
  Administered 2015-10-21: 2700 [IU] via INTRAVENOUS
  Filled 2015-10-21: qty 2700

## 2015-10-21 MED ORDER — CALCIUM GLUCONATE 10 % IV SOLN
2.0000 g | Freq: Once | INTRAVENOUS | Status: AC
  Administered 2015-10-21: 2 g via INTRAVENOUS
  Filled 2015-10-21: qty 20

## 2015-10-21 MED ORDER — INSULIN ASPART 100 UNIT/ML ~~LOC~~ SOLN: 0.0000 [IU] | Freq: Three times a day (TID) | SUBCUTANEOUS | Status: DC

## 2015-10-21 MED ORDER — INSULIN ASPART 100 UNIT/ML ~~LOC~~ SOLN
0.0000 [IU] | SUBCUTANEOUS | Status: DC
  Administered 2015-10-21: 7 [IU] via SUBCUTANEOUS
  Administered 2015-10-21: 11 [IU] via SUBCUTANEOUS
  Administered 2015-10-21: 7 [IU] via SUBCUTANEOUS
  Administered 2015-10-21: 15 [IU] via SUBCUTANEOUS
  Administered 2015-10-22: 11 [IU] via SUBCUTANEOUS
  Administered 2015-10-22: 15 [IU] via SUBCUTANEOUS
  Filled 2015-10-21: qty 7
  Filled 2015-10-21: qty 11
  Filled 2015-10-21: qty 7
  Filled 2015-10-21: qty 15
  Filled 2015-10-21: qty 11
  Filled 2015-10-21: qty 15

## 2015-10-21 MED ORDER — MIDAZOLAM HCL 2 MG/2ML IJ SOLN
2.0000 mg | Freq: Once | INTRAMUSCULAR | Status: AC
  Administered 2015-10-21: 2 mg via INTRAVENOUS

## 2015-10-21 MED ORDER — VITAL HIGH PROTEIN PO LIQD: 1000.0000 mL | ORAL | Status: DC

## 2015-10-21 MED ORDER — FREE WATER
100.0000 mL | Freq: Three times a day (TID) | Status: DC
  Administered 2015-10-21 – 2015-10-28 (×20): 100 mL

## 2015-10-21 MED ORDER — VITAL AF 1.2 CAL PO LIQD
1000.0000 mL | ORAL | Status: DC
  Administered 2015-10-21 – 2015-10-22 (×5): 1000 mL

## 2015-10-21 MED ORDER — PRO-STAT SUGAR FREE PO LIQD
30.0000 mL | Freq: Three times a day (TID) | ORAL | Status: DC
  Administered 2015-10-21 – 2015-10-29 (×23): 30 mL

## 2015-10-21 MED ORDER — MIDAZOLAM HCL 2 MG/2ML IJ SOLN
1.0000 mg | INTRAMUSCULAR | Status: DC | PRN
  Administered 2015-10-21: 1 mg via INTRAVENOUS
  Filled 2015-10-21 (×2): qty 2

## 2015-10-21 MED ORDER — INSULIN ASPART 100 UNIT/ML ~~LOC~~ SOLN: 0.0000 [IU] | Freq: Every day | SUBCUTANEOUS | Status: DC

## 2015-10-21 MED FILL — Medication: Qty: 1 | Status: AC

## 2015-10-21 NOTE — Progress Notes (Signed)
Dr. Lucky Cowboy looked at chest xray and gave order that it is okay to use central line.

## 2015-10-21 NOTE — Progress Notes (Signed)
Notified Dr. Emmit Alexanders Platelet Count 23 per am labs acknowledged no further orders given at this time will make day shift aware of drop in platelet count and will inform day shift nurse to make Dr. Stevenson Clinch aware

## 2015-10-21 NOTE — Progress Notes (Signed)
Update: Discussed case with Vascular Surgery (Dr. Leotis Pain). Appreciate Dr. Lucky Cowboy assistance with CVL [facility]) placement.  Also, he noted that both her lower extremities were mottled with possible irreversible skin changes, non-palpable pulses and cold, may need bilateral AKA in the future. Will follow up his consult note.    Vilinda Boehringer, MD [facility] Pager [phone] (please enter 7-digits) On Call Pager - [phone] (please enter 7-digits)

## 2015-10-21 NOTE — Progress Notes (Signed)
[facility] Progress Note Patient Name: April [name] DOB: 12-18-1945 MRN: [id number]   Date of Service  10/21/2015  HPI/Events of Note  Multiple issues: 1. Thrombocytopenia - Platelets = 46K. Patient is on Heparin IV infusion for elevated Troponin and CVVH. DDx: DIC vs HIT vs CVVH filter and 2. Ca++ = 6.6 with albumin = 3.1 >> Ca++ corrected for albumin = 7.32.   eICU Interventions  Will order: 1. Replete Ca++. 2. Fibrinogen and PT/INR now to establish if patient has DIC.     Intervention Category Intermediate Interventions: Thrombocytopenia - evaluation and management;Electrolyte abnormality - evaluation and management  Shree Espey Eugene 10/21/2015, 12:15 AM

## 2015-10-21 NOTE — Progress Notes (Signed)
Patient very agitated and restless, sitting up in bed and trying to pull at ET tube.  Patient also swatting at RN and Nira Conn, RRT.  Not following commands.  Fentanyl drip increased to 316mg/H. Continuing to monitor.

## 2015-10-21 NOTE — Progress Notes (Signed)
Pt remains intubated FiO2 at 100% rate increased to 30 per Dr. Pura Spice orders; during shift pt reaching for Et tube and became agitated increased Fentanyl drip to 250 mcg/hr pt resting comfortably; Amiodarone drip infusing at 60 mg/hr per Dr. Pura Spice telephone orders will not titrate drip to 30 mg/hr drip to remain at 60 mg/hr pt remains in afibb with heart rate ranging between 115 to 140's Dr. Emmit Alexanders aware; poor uop via foley CRRT initated during shift without complications; Heparin drip remains infusing per Dr. Emmit Alexanders telephone orders md aware of platelet count no s/s of bleeding present; vasopressin and levophed drip infusing bp currently stable; ABG improved during shift once 1 amp of Sodium Bicarb and Sodium Bicarb drip administered; pts family updated about plan of care and questions answered throughout shift will continue to monitor and assess pt

## 2015-10-21 NOTE — Progress Notes (Signed)
Antibiotic follow up note  [employer] returned Proteus spp. from blood cx., KPC (-). Pt already on Zosyn for treatment for sepsis. No change in abx per conversation with hospitalist.  Sim Boast, PharmD, BCPS  10/21/2015

## 2015-10-21 NOTE — Progress Notes (Signed)
Dr. Stevenson Clinch gave order that OG tube is in good position as verified by xray.

## 2015-10-21 NOTE — Op Note (Signed)
[facility]   PROCEDURE NOTE  PROCEDURE: 1. Left internal jugular central venous catheter placement 2. Left internal jugular cannulation under ultrasound guidance  PRE-OPERATIVE DIAGNOSIS: MSOF, obesity, ischemic lower extremities, poor venous access  POST-OPERATIVE DIAGNOSIS: same as above  SURGEON: Neha Waight, MD  ANESTHESIA:  None  ESTIMATED BLOOD LOSS: minimal  FINDING(S): none  SPECIMEN(S):  none  INDICATIONS:   April [name] is a 70 y.o. female who presents with need for venous access.  The patient presents for central venous catheter placement.  The patient is aware the risks of central venous catheter placement include but are not limited to: bleeding, infection, central venous injury, pneumothorax, possible venous stenosis, possible malpositioning in the venous system, and possible infections related to long-term catheter presence. The patient was aware of these risks and agreed to proceed.  DESCRIPTION: After written informed consent was obtained from the patient and/or family, the patient was placed supine in the hospital bed.  The patient was prepped with chloraprep and draped in the standard fashion for a chest or neck central venous catheter placement.  I anesthesized the neck cannulation site with 1% lidocaine then under ultrasound guidance, the left jugular vein was cannulated with the 18 gauge needle.  A J wire was then placed down in the superior vena cava.  After a skin nick and dilatation, the triple lumen central venous catheter was placed over the wire and the wire was removed.  Each port was aspirated and flushed with sterile normal saline.  The catheter was secured in placed with three interrupted stitches of 3-0 Silk tied to the catheter.  The catheter was dressed with sterile dressing.  Stat CXR is pending  COMPLICATIONS: none apparent  CONDITION: stable  Keyshawn Hellwig 10/21/2015, 4:26 PM

## 2015-10-21 NOTE — Progress Notes (Signed)
*  PRELIMINARY RESULTS* Echocardiogram 2D Echocardiogram has been performed.  April [name] 10/21/2015, 8:51 AM

## 2015-10-21 NOTE — Progress Notes (Signed)
Dr. Juleen China present and spoke with nurse giving order for blood flow rate to be changed to 350 ml/min and for ultrafiltration to be 35m/H.

## 2015-10-21 NOTE — Consult Note (Signed)
[facility] Vascular Consult Note  MRN : [id number]  April [name] is a 70 y.o. (Jun 22, 1946) female who presents with chief complaint of  Chief Complaint  Patient presents with  . Altered Mental Status  .  History of Present Illness: Patient admitted with altered mental status after being found down for two days.  She is mottled in both LE and has fixed skin changes.  Respiratory failure on the vent.  Renal failure and on dialysis.  Has poor venous access and we are asked to place central line.  She is unable to provide any history  Current Facility-Administered Medications  Medication Dose Route Frequency Provider Last Rate Last Dose  . acetaminophen (TYLENOL) tablet 650 mg  650 mg Oral Q6H PRN Fritzi Mandes, MD       Or  . acetaminophen (TYLENOL) suppository 650 mg  650 mg Rectal Q6H PRN Fritzi Mandes, MD      . amiodarone (NEXTERONE PREMIX) 360 MG/200ML (1.8 mg/mL) IV infusion  60 mg/hr Intravenous Continuous [name], MD 33.3 mL/hr at 10/21/15 1524 60 mg/hr at 10/21/15 1524  . antiseptic oral rinse solution (CORINZ)  7 mL Mouth Rinse 6 times per day Vilinda Boehringer, MD   7 mL at 10/21/15 1135  . cefTRIAXone (ROCEPHIN) 2 g in dextrose 5 % 50 mL IVPB  2 g Intravenous Once Charlett Nose, [facility]      . [START ON 10/22/2015] cefTRIAXone (ROCEPHIN) 2 g in dextrose 5 % 50 mL IVPB  2 g Intravenous Q24H Charlett Nose, RPH      . chlorhexidine gluconate (PERIDEX) 0.12 % solution 15 mL  15 mL Mouth/Throat BID Vishal Mungal, MD   15 mL at 10/21/15 0959  . feeding supplement (PRO-STAT SUGAR FREE 64) liquid 30 mL  30 mL Per Tube TID Vishal Mungal, MD      . feeding supplement (VITAL AF 1.2 CAL) liquid 1,000 mL  1,000 mL Per Tube Continuous Vishal Mungal, MD      . fentaNYL 2554mg in NS 2563m(1021mml) infusion-PREMIX  10 mcg/hr Intravenous Continuous RebLisa RocaD 30 mL/hr at 10/21/15 1524 300 mcg/hr at 10/21/15 1524  . free water 100 mL  100 mL Per Tube 3 times per  day Vishal Mungal, MD   100 mL at 10/21/15 1446  . heparin injection 1,000-6,000 Units  1,000-6,000 Units CRRT PRN SarLavonia DanaD      . hydrocortisone sodium succinate (SOLU-CORTEF) 100 MG injection 50 mg  50 mg Intravenous Q6H Vishal Mungal, MD   50 mg at 10/21/15 1059  . insulin aspart (novoLOG) injection 0-20 Units  0-20 Units Subcutaneous 6 times per day VisVilinda BoehringerD   7 Units at 10/21/15 1134  . midazolam (VERSED) 50 mg in sodium chloride 0.9 % 50 mL (1 mg/mL) infusion  1 mg/hr Intravenous Continuous RebLisa RocaD   Stopped at 10/20/15 1045  . midazolam (VERSED) injection 1 mg  1 mg Intravenous Q1H PRN SteAnders SimmondsD   1 mg at 10/21/15 0541  . norepinephrine (LEVOPHED) 16 mg in dextrose 5 % 250 mL (0.064 mg/mL) infusion  0-70 mcg/min Intravenous Titrated SteAnders SimmondsD 45.9 mL/hr at 10/21/15 1400 49 mcg/min at 10/21/15 1400  . ondansetron (ZOFRAN) tablet 4 mg  4 mg Oral Q6H PRN SonFritzi MandesD       Or  . ondansetron (ZOFRAN) injection 4 mg  4 mg Intravenous Q6H PRN SonFritzi MandesD      .  pantoprazole (PROTONIX) injection 40 mg  40 mg Intravenous QHS Vishal Mungal, MD   40 mg at 10/20/15 2148  . pureflow IV solution for Dialysis   CRRT Continuous Lavonia Dana, MD 2,000 mL/hr at 10/21/15 1050 3 each at 10/21/15 1050  . sodium bicarbonate 150 mEq in dextrose 5 % 1,000 mL infusion   Intravenous Continuous Rush Farmer, MD 100 mL/hr at 10/21/15 1019    . vasopressin (PITRESSIN) 40 Units in sodium chloride 0.9 % 250 mL (0.16 Units/mL) infusion  0.03 Units/min Intravenous Continuous Rush Farmer, MD 11.3 mL/hr at 10/21/15 0600 0.03 Units/min at 10/21/15 0600    No past medical history on file.  No past surgical history on file.  Social History Social History  Substance Use Topics  . Smoking status: Unknown If Ever Smoked  . Smokeless tobacco: Not on file  . Alcohol Use: Not on file  unable to obtain due to the severity of the illness in intubated and sedated  patient  Family History Unable to obtain due to the severity of the illness in intubated and sedated patient  Allergies  Allergen Reactions  . Prednisone Anaphylaxis     REVIEW OF SYSTEMS (Negative unless checked)  Unable to obtain due to severity of illness in patient intubated and sedated in the ICU  Physical Examination  Filed Vitals:   10/21/15 1430 10/21/15 1445 10/21/15 1500 10/21/15 1515  BP: 110/51 107/68 97/71 107/80  Pulse: 108 102 99 108  Temp: 99.1 F (37.3 C) 99 F (37.2 C) 99 F (37.2 C) 99 F (37.2 C)  TempSrc:      Resp: '30 30 18 29  '$ Height:      Weight:      SpO2: 100% 100% 82% 100%   Body mass index is 42.43 kg/(m^2). Gen:  WD/WN, critically ill appearing Head: [place]/AT, No temporalis wasting. Prominent temp pulse not noted. Ear/Nose/Throat: Hearing grossly intact, nares w/o erythema or drainage, oropharynx w/o Erythema/Exudate Eyes: PERRLA, EOMI.  Neck: Supple, no nuchal rigidity.  No JVD.  Pulmonary:  Coarse BS, on vent Cardiac: RRR, normal S1, S2. Vascular:  Vessel Right Left  Radial Palpable Palpable  Ulnar Palpable Palpable  Brachial Palpable Palpable  Carotid Palpable, without bruit Palpable, without bruit  Aorta Not palpable N/A  Femoral Not Palpable Not Palpable  Popliteal Not Palpable Not Palpable  PT Not Palpable Not Palpable  DP Not Palpable Not Palpable   Gastrointestinal: soft, non-tender/non-distended. No guarding/reflex. No masses, surgical incisions, or scars. Musculoskeletal: mottling and fixed skin changes to the calf bilaterally. 2+ BLE edema. Neurologic: moves to pain.  Unable to assess much more than that Psychiatric: intubated, sedated.  Unable to assess much Dermatologic: feet mottled with fixed skin changes up to the calf bilaterally without capillary refill. Lymph : No Cervical, Axillary, or Inguinal lymphadenopathy.   CBC Lab Results  Component Value Date   WBC 38.8* 10/21/2015   HGB 13.3 10/21/2015   HCT 40.2  10/21/2015   MCV 90.8 10/21/2015   PLT 23* 10/21/2015    BMET    Component Value Date/Time   NA 135 10/21/2015 0515   K 3.9 10/21/2015 0515   CL 103 10/21/2015 0515   CO2 18* 10/21/2015 0515   GLUCOSE 263* 10/21/2015 0515   BUN 45* 10/21/2015 0515   CREATININE 1.87* 10/21/2015 0515   CALCIUM 7.3* 10/21/2015 0515   GFRNONAA 26* 10/21/2015 0515   GFRAA 31* 10/21/2015 0515   Estimated Creatinine Clearance: 38.6 mL/min (by C-G formula based  on Cr of 1.87).  COAG Lab Results  Component Value Date   INR 1.52 10/21/2015   INR 1.46 10/20/2015    Radiology Ct Abdomen Pelvis Wo Contrast  10/20/2015  CLINICAL DATA:  Patient had not been hurt from for couple days, was found by EMS between the wall and the bed, responsive to pain, leukocytosis, elevated glucose and creatinine, abdominal swelling, pelvic pain EXAM: CT ABDOMEN AND PELVIS WITHOUT CONTRAST TECHNIQUE: Multidetector CT imaging of the abdomen and pelvis was performed following the standard protocol without IV contrast. Sagittal and coronal MPR images reconstructed from axial data set. COMPARISON:  None FINDINGS: Bibasilar atelectasis. Calcifications at LEFT lung base may represent calcified granuloma within atelectatic lower lobe. Marked RIGHT hydronephrosis without definite ureteral calcification or dilatation. Edema surrounding the RIGHT kidney can be related to obstruction. Small nonobstructing calculus 4 mm diameter RIGHT kidney image 51. Beam hardening artifacts from patient's arms traverse upper abdomen. Within limits of noncontrast technique and beam hardening artifacts, no definite additional abnormalities of the liver, spleen, atrophic pancreas, or kidneys. BILATERAL thickened adrenal glands without discrete mass. Gallbladder distended without mass or surrounding inflammatory changes. Extensive colonic diverticulosis without evidence of diverticulitis. Stomach and remaining bowel loops normal appearance. Small umbilical hernia  containing fat. Normal appendix. Unremarkable uterus and RIGHT adnexa. LEFT ovary prominent size for age at 3.8 x 3.6 x 3.6 cm. Bladder decompressed by Foley catheter. Shotty retroperitoneal nodes without adenopathy. No mass, adenopathy, free air or free fluid. Scattered atherosclerotic calcifications. Bones demineralized. IMPRESSION: RIGHT hydronephrosis question UPJ obstruction; in the setting of leukocytosis, recommend correlation with urinalysis to exclude urinary tract infection. Scattered colonic diverticulosis. Prominent LEFT ovarian size for age ; non emergent followup pelvic and transvaginal sonography recommended to characterize. Electronically Signed   By: Lavonia Dana M.D.   On: 10/20/2015 12:50   Dg Chest 1 View  10/20/2015  CLINICAL DATA:  Patient found down and minimally responsive 10/20/2015. Status post central line placement. EXAM: CHEST 1 VIEW COMPARISON:  Single view of the chest earlier today. FINDINGS: A new right IJ catheter is in place with the tip projecting over the lower superior vena cava. There is no pneumothorax. Endotracheal tube is again seen. Defibrillator pad is in place. There is no pneumothorax. Small left effusion and basilar atelectasis are identified. Mild interstitial edema. IMPRESSION: Right IJ catheter tip projects over the lower superior vena cava. Negative for pneumothorax. Mild interstitial edema with a small left effusion and basilar atelectasis. Electronically Signed   By: Inge Rise M.D.   On: 10/20/2015 18:46   Dg Abd 1 View  10/21/2015  CLINICAL DATA:  Orogastric tube placement. EXAM: ABDOMEN - 1 VIEW COMPARISON:  CT 10/20/2015. FINDINGS: Orogastric tube noted projected over the distal stomach. No bowel distention. Degenerative changes lumbar spine. IMPRESSION: Orogastric tube noted with its tip the left projected over the distal stomach . No bowel distention. Electronically Signed   By: Coates   On: 10/21/2015 07:21   Ct Head Wo  Contrast  10/20/2015  CLINICAL DATA:  Found unresponsive today. EXAM: CT HEAD WITHOUT CONTRAST CT CERVICAL SPINE WITHOUT CONTRAST TECHNIQUE: Multidetector CT imaging of the head and cervical spine was performed following the standard protocol without intravenous contrast. Multiplanar CT image reconstructions of the cervical spine were also generated. COMPARISON:  None. FINDINGS: CT HEAD FINDINGS Normal appearing cerebral hemispheres and posterior fossa structures. Normal size and position of the ventricles. No intracranial hemorrhage, mass lesion or CT evidence of acute infarction. Unremarkable bones.  Mild mucosal thickening involving all of the paranasal sinuses with a small amount of fluid in the sphenoid sinus bilaterally. CT CERVICAL SPINE FINDINGS An endotracheal tube is in place. Multilevel degenerative changes. Straightening of the normal cervical lordosis. No prevertebral soft tissue swelling, fractures or subluxations. Bilateral carotid artery calcifications. IMPRESSION: 1. No acute intracranial abnormality. 2. No cervical spine fracture or subluxation. 3. Mild chronic pansinusitis with an acute component in the sphenoid sinuses. 4. Cervical spine degenerative changes. 5. Bilateral carotid artery atheromatous calcifications. Electronically Signed   By: Claudie Revering M.D.   On: 10/20/2015 13:25   Ct Cervical Spine Wo Contrast  10/20/2015  CLINICAL DATA:  Found unresponsive today. EXAM: CT HEAD WITHOUT CONTRAST CT CERVICAL SPINE WITHOUT CONTRAST TECHNIQUE: Multidetector CT imaging of the head and cervical spine was performed following the standard protocol without intravenous contrast. Multiplanar CT image reconstructions of the cervical spine were also generated. COMPARISON:  None. FINDINGS: CT HEAD FINDINGS Normal appearing cerebral hemispheres and posterior fossa structures. Normal size and position of the ventricles. No intracranial hemorrhage, mass lesion or CT evidence of acute infarction.  Unremarkable bones. Mild mucosal thickening involving all of the paranasal sinuses with a small amount of fluid in the sphenoid sinus bilaterally. CT CERVICAL SPINE FINDINGS An endotracheal tube is in place. Multilevel degenerative changes. Straightening of the normal cervical lordosis. No prevertebral soft tissue swelling, fractures or subluxations. Bilateral carotid artery calcifications. IMPRESSION: 1. No acute intracranial abnormality. 2. No cervical spine fracture or subluxation. 3. Mild chronic pansinusitis with an acute component in the sphenoid sinuses. 4. Cervical spine degenerative changes. 5. Bilateral carotid artery atheromatous calcifications. Electronically Signed   By: Claudie Revering M.D.   On: 10/20/2015 13:25   US Renal  10/21/2015  CLINICAL DATA:  Acute renal failure. EXAM: RENAL / URINARY TRACT ULTRASOUND COMPLETE COMPARISON:  CT 10/20/2015. FINDINGS: Right Kidney: Length: 12.3 cm. Echogenicity within normal limits. No mass. Mild right hydronephrosis. Left Kidney: Length: 13.0 cm. Echogenicity within normal limits. No mass or hydronephrosis visualized. Bladder: Foley catheter in bladder. Bladder decompressed. Limited exam due to body habitus . IMPRESSION: 1.  Mild right hydronephrosis.  Similar finding noted on prior CT. 2. Foley catheter in bladder.  Bladder decompressed. Electronically Signed   By: Simms   On: 10/21/2015 10:09   Dg Chest Port 1 View  10/20/2015  CLINICAL DATA:  Hypoxia. EXAM: PORTABLE CHEST 1 VIEW COMPARISON:  None. FINDINGS: Endotracheal tube tip is 4.5 cm above the carina. No pneumothorax. There is no edema or consolidation. Heart size and pulmonary vascularity within normal limits. No adenopathy. No bone lesions. IMPRESSION: Endotracheal tube as described without pneumothorax. No edema or consolidation. Electronically Signed   By: Lowella Grip III M.D.   On: 10/20/2015 10:53      Assessment/Plan 1. MSOF and poor venous access.  Left jugular TLC  placed. CXR pending. 2. ARF.  With dialysis catheter in place.  If does not improve renal function but survives, will eventually need permcath 3. Mottled BLE. Fixed skin changes.  Even though doppler signals are present, there appears to be significant tissue loss and expect Bilateral AKAs may be necessary if she survives her other issues.   Zephyra Bernardi, MD  10/21/2015 4:17 PM

## 2015-10-21 NOTE — Progress Notes (Signed)
RN discussed with Dr. Stevenson Clinch that patient has been agitated this morning trying to sit up in bed and pulling at ET tube while also swatting at RN and RRT, but patient will not follow commands making eye contact only. Wake up assessment with sedation vacation deferred per Dr. Stevenson Clinch

## 2015-10-21 NOTE — Consult Note (Signed)
CARDIOLOGY CONSULT NOTE   Patient ID: April [name] MRN: [id number], DOB/AGE: [birth year]   Admit date: 10/20/2015 Date of Consult: 10/21/2015 Reason for Consult: Elevated Troponin Physician Requesting Consult:   Primary Physician: Albina Billet, MD Primary Cardiologist: New to [place]  HPI: April [name] is a 70 y.o. with no known prior medical history who presented to [facility] on 10/20/2015 after being found down on the floor by her coworkers.   Her co-worker/friend is present at the bedside today. History is obtained from her and by review of the medical record. The patient is currently intubated and sedated, therefore unable to contribute to her past medical history, family history, or social history.  The patient's friend says she called into work Sunday morning due to not feeling well. They did not hear from her on Monday and when she did not notify her workplace on Tuesday, they went to check on the patient and found her lying on the floor. EMS was called and her initial SBP was in the 50's upon their arrival.  In the ED, she was intubated and given over 6L of fluid, along with being started on IV Fentanyl and IV Levophed. She was also started on IV Zosyn and IV Vancomycin for septic shock. Her SBP was in the 120's at time of admission.  Initial lab values showed WBC of 50.0. Hgb 16.4. Platelets were 62 on admission, now down to 23. Creatinine of 3.01. Lactic Acid 6.6. CK of 1562. Initial troponin was 1.21 with repeat value of 1.08. Blood cultures positive for Proteus Mirabilis. She was initially in Sinus Tachycardia but at approximately 10:15 AM on 10/20/2015, she went into Atrial Fibrillation with RVR. She was started on an Amiodarone drip with her HR now stable in the low-100's to 110's. SBP is now in the 90's to low-100's on Levophed and Vasopressin.  CT of the Abdomen showed right hydronephrosis, scattered diverticulosis, and a prominent left ovary. CXR showed mild interstitial  edema with a small left plural effusion and basilar atelectasis. CT Head showed no acute intracranial abnormalities. Bilateral carotid artery calcifications were noted.   Problem List No past medical history on file. - Not able to be obtained due to intubation. No past surgical history on file. - Not able to be obtained due to intubation.  Allergies Allergies  Allergen Reactions  . Prednisone Anaphylaxis    Inpatient Medications . antiseptic oral rinse  7 mL Mouth Rinse 6 times per day  . cefTRIAXone (ROCEPHIN)  IV  2 g Intravenous Once  . [START ON 10/22/2015] cefTRIAXone (ROCEPHIN)  IV  2 g Intravenous Q24H  . chlorhexidine gluconate  15 mL Mouth/Throat BID  . hydrocortisone sod succinate (SOLU-CORTEF) inj  50 mg Intravenous Q6H  . insulin aspart  0-20 Units Subcutaneous 6 times per day  . pantoprazole (PROTONIX) IV  40 mg Intravenous QHS    Family History No family history on file. - Not able to be obtained due to patient's current intubation. No family at bedside.  Social History Social History   Social History  . Marital Status: Single    Spouse Name: N/A  . Number of Children: N/A  . Years of Education: N/A   Occupational History  . Not on file.   Social History Main Topics  . Smoking status: Unknown If Ever Smoked  . Smokeless tobacco: Not on file  . Alcohol Use: Not on file  . Drug Use: Not on file  . Sexual Activity: Not on file  Other Topics Concern  . Not on file   Social History Narrative  . No narrative on file     Review of Systems: Unable to be obtained secondary to intubation and sedation.  Physical Exam  Blood pressure 104/75, pulse 115, temperature 99.1 F (37.3 C), temperature source Core (Comment), resp. rate 30, height '5\' 7"'$  (1.702 m), weight 270 lb 15.1 oz (122.9 kg), SpO2 100 %.  General: Middle-aged Caucasian female currently intubated and sedated. RIJ in place. Psych: Normal affect. Neuro: Currently intubated and sedated. HEENT:  Normocephalic and atraumatic.  Neck: Supple without bruits or JVD. Lungs:  Currently intubated. No rales or wheezing appreciated anteriorly Heart: Irregularly irregular, tachycardiac, no s3, s4, or murmurs. Abdomen: Soft, non-tender, non-distended, BS + x 4.  Extremities: 1+ edema bilaterally. Lower extremities are mildly cyanotic. Distal pulses 1+ bilaterally.  Labs   Recent Labs  10/20/15 0957 10/20/15 1010 10/20/15 2242  CKTOTAL  --  1562* 779*  TROPONINI 1.21*  --  1.08*   Lab Results  Component Value Date   WBC 38.8* 10/21/2015   HGB 13.3 10/21/2015   HCT 40.2 10/21/2015   MCV 90.8 10/21/2015   PLT 23* 10/21/2015    Recent Labs Lab 10/21/15 0515  NA 135  K 3.9  CL 103  CO2 18*  BUN 45*  CREATININE 1.87*  CALCIUM 7.3*  PROT 5.3*  BILITOT 3.1*  ALKPHOS 391*  ALT 176*  AST 312*  GLUCOSE 263*    Radiology/Studies  Ct Abdomen Pelvis Wo Contrast: 10/20/2015  CLINICAL DATA:  Patient had not been hurt from for couple days, was found by EMS between the wall and the bed, responsive to pain, leukocytosis, elevated glucose and creatinine, abdominal swelling, pelvic pain EXAM: CT ABDOMEN AND PELVIS WITHOUT CONTRAST TECHNIQUE: Multidetector CT imaging of the abdomen and pelvis was performed following the standard protocol without IV contrast. Sagittal and coronal MPR images reconstructed from axial data set. COMPARISON:  None FINDINGS: Bibasilar atelectasis. Calcifications at LEFT lung base may represent calcified granuloma within atelectatic lower lobe. Marked RIGHT hydronephrosis without definite ureteral calcification or dilatation. Edema surrounding the RIGHT kidney can be related to obstruction. Small nonobstructing calculus 4 mm diameter RIGHT kidney image 51. Beam hardening artifacts from patient's arms traverse upper abdomen. Within limits of noncontrast technique and beam hardening artifacts, no definite additional abnormalities of the liver, spleen, atrophic pancreas, or  kidneys. BILATERAL thickened adrenal glands without discrete mass. Gallbladder distended without mass or surrounding inflammatory changes. Extensive colonic diverticulosis without evidence of diverticulitis. Stomach and remaining bowel loops normal appearance. Small umbilical hernia containing fat. Normal appendix. Unremarkable uterus and RIGHT adnexa. LEFT ovary prominent size for age at 3.8 x 3.6 x 3.6 cm. Bladder decompressed by Foley catheter. Shotty retroperitoneal nodes without adenopathy. No mass, adenopathy, free air or free fluid. Scattered atherosclerotic calcifications. Bones demineralized. IMPRESSION: RIGHT hydronephrosis question UPJ obstruction; in the setting of leukocytosis, recommend correlation with urinalysis to exclude urinary tract infection. Scattered colonic diverticulosis. Prominent LEFT ovarian size for age ; non emergent followup pelvic and transvaginal sonography recommended to characterize. Electronically Signed   By: Lavonia Dana M.D.   On: 10/20/2015 12:50   Dg Chest 1 View: 10/20/2015  CLINICAL DATA:  Patient found down and minimally responsive 10/20/2015. Status post central line placement. EXAM: CHEST 1 VIEW COMPARISON:  Single view of the chest earlier today. FINDINGS: A new right IJ catheter is in place with the tip projecting over the lower superior vena cava. There is no  pneumothorax. Endotracheal tube is again seen. Defibrillator pad is in place. There is no pneumothorax. Small left effusion and basilar atelectasis are identified. Mild interstitial edema. IMPRESSION: Right IJ catheter tip projects over the lower superior vena cava. Negative for pneumothorax. Mild interstitial edema with a small left effusion and basilar atelectasis. Electronically Signed   By: Inge Rise M.D.   On: 10/20/2015 18:46   Dg Abd 1 View: 10/21/2015  CLINICAL DATA:  Orogastric tube placement. EXAM: ABDOMEN - 1 VIEW COMPARISON:  CT 10/20/2015. FINDINGS: Orogastric tube noted projected over the  distal stomach. No bowel distention. Degenerative changes lumbar spine. IMPRESSION: Orogastric tube noted with its tip the left projected over the distal stomach . No bowel distention. Electronically Signed   By: Marcello Moores  Register   On: 10/21/2015 07:21   Ct Head Wo Contrast: 10/20/2015  CLINICAL DATA:  Found unresponsive today. EXAM: CT HEAD WITHOUT CONTRAST CT CERVICAL SPINE WITHOUT CONTRAST TECHNIQUE: Multidetector CT imaging of the head and cervical spine was performed following the standard protocol without intravenous contrast. Multiplanar CT image reconstructions of the cervical spine were also generated. COMPARISON:  None. FINDINGS: CT HEAD FINDINGS Normal appearing cerebral hemispheres and posterior fossa structures. Normal size and position of the ventricles. No intracranial hemorrhage, mass lesion or CT evidence of acute infarction. Unremarkable bones. Mild mucosal thickening involving all of the paranasal sinuses with a small amount of fluid in the sphenoid sinus bilaterally. CT CERVICAL SPINE FINDINGS An endotracheal tube is in place. Multilevel degenerative changes. Straightening of the normal cervical lordosis. No prevertebral soft tissue swelling, fractures or subluxations. Bilateral carotid artery calcifications. IMPRESSION: 1. No acute intracranial abnormality. 2. No cervical spine fracture or subluxation. 3. Mild chronic pansinusitis with an acute component in the sphenoid sinuses. 4. Cervical spine degenerative changes. 5. Bilateral carotid artery atheromatous calcifications. Electronically Signed   By: Claudie Revering M.D.   On: 10/20/2015 13:25   Ct Cervical Spine Wo Contrast: 10/20/2015  CLINICAL DATA:  Found unresponsive today. EXAM: CT HEAD WITHOUT CONTRAST CT CERVICAL SPINE WITHOUT CONTRAST TECHNIQUE: Multidetector CT imaging of the head and cervical spine was performed following the standard protocol without intravenous contrast. Multiplanar CT image reconstructions of the cervical spine  were also generated. COMPARISON:  None. FINDINGS: CT HEAD FINDINGS Normal appearing cerebral hemispheres and posterior fossa structures. Normal size and position of the ventricles. No intracranial hemorrhage, mass lesion or CT evidence of acute infarction. Unremarkable bones. Mild mucosal thickening involving all of the paranasal sinuses with a small amount of fluid in the sphenoid sinus bilaterally. CT CERVICAL SPINE FINDINGS An endotracheal tube is in place. Multilevel degenerative changes. Straightening of the normal cervical lordosis. No prevertebral soft tissue swelling, fractures or subluxations. Bilateral carotid artery calcifications. IMPRESSION: 1. No acute intracranial abnormality. 2. No cervical spine fracture or subluxation. 3. Mild chronic pansinusitis with an acute component in the sphenoid sinuses. 4. Cervical spine degenerative changes. 5. Bilateral carotid artery atheromatous calcifications. Electronically Signed   By: Claudie Revering M.D.   On: 10/20/2015 13:25   US Renal: 10/21/2015  CLINICAL DATA:  Acute renal failure. EXAM: RENAL / URINARY TRACT ULTRASOUND COMPLETE COMPARISON:  CT 10/20/2015. FINDINGS: Right Kidney: Length: 12.3 cm. Echogenicity within normal limits. No mass. Mild right hydronephrosis. Left Kidney: Length: 13.0 cm. Echogenicity within normal limits. No mass or hydronephrosis visualized. Bladder: Foley catheter in bladder. Bladder decompressed. Limited exam due to body habitus . IMPRESSION: 1.  Mild right hydronephrosis.  Similar finding noted on prior CT. 2. Foley  catheter in bladder.  Bladder decompressed. Electronically Signed   By: Jakes Corner   On: 10/21/2015 10:09   Dg Chest Port 1 View: 10/20/2015  CLINICAL DATA:  Hypoxia. EXAM: PORTABLE CHEST 1 VIEW COMPARISON:  None. FINDINGS: Endotracheal tube tip is 4.5 cm above the carina. No pneumothorax. There is no edema or consolidation. Heart size and pulmonary vascularity within normal limits. No adenopathy. No bone lesions.  IMPRESSION: Endotracheal tube as described without pneumothorax. No edema or consolidation. Electronically Signed   By: Lowella Grip III M.D.   On: 10/20/2015 10:53    ECG: Sinus tachycardia, HR 126, Non-specific ST changes in inferior leads  ECHOCARDIOGRAM: Pending   ASSESSMENT AND PLAN  1. Elevated Troponin - Initial troponin was 1.21 with repeat value of 1.08. Will cycle values again.  - unsure of any past cardiac history or cardiac risk factors. - EKG shows no acute ischemic changes - Echocardiogram to assess LV function and wall motion is pending.  2. New Onset Atrial Fibrillation - presented in Sinus Tachycardia. Went into atrial fibrillation with RVR at 10:15 AM and started on IV amiodarone. Now on 16.7 mL/hr. HR better controlled in the 100's - 110's. Unable to use Cardizem or BB due to hypotension. - This patients CHA2DS2-VASc Score and unadjusted Ischemic Stroke Rate (% per year) is equal to 2.2 % stroke rate/year from a score of 2 (Female, Age). Could be higher once we know more medical history. Was on Heparin but this has been discontinued due to worsening thrombocytopenia.  3. Septic Shock - blood cultures positive for Proteus Mirabilis - currently on Vancomycin and Zosyn - requiring pressor support with Levophed. SBP now in 90's to low-100's - WBC trending down 50.5 on admission to 38.8 on 10/21/2015. - per admitting team  4. Acute Renal Failure - creatinine 3.01 on admission, improved to 1.87 on 10/21/2015. Currently on CRRT. - Renal US showing mild right hydronephrosis - Nephrology following.  5. Thrombocytopenia - Platelets were 62 on admission, now down to 23. - Heparin has been discontinued.   Signed, Erma Heritage, PA-C 10/21/2015, 12:14 PM Pager: [phone]

## 2015-10-21 NOTE — Progress Notes (Signed)
Initial Nutrition Assessment    INTERVENTION:   EN: received verbal order from MD Mungal to start TF; recommend starting trophic feedings of Vital 1.2 AF as pt requiring high dose vasopressors at this time; if tolerating, consider titration on follow-up tomorrow. Continue to assess   NUTRITION DIAGNOSIS:   Inadequate oral intake related to acute illness as evidenced by NPO status.  GOAL:   Provide needs based on ASPEN/SCCM guidelines  MONITOR:    (Energy Intake, Pulmonary, Digestive System, Electrolyte/Renal Profile, Glucose Profile)  REASON FOR ASSESSMENT:   Ventilator    ASSESSMENT:    Pt admitted after being found at home minimally responsive with sepsis on levophed (62 mcg/min) and vasopressin, ARF with metabolic acidosis on CRRT, currently on vent  No past medical history on file.   Diet Order:  Diet NPO time specified  Skin:  Reviewed, no issues  Digestive System: abdomen obese/soft, BS present, OG in place  Electrolyte and Renal Profile:  Recent Labs Lab 10/20/15 0957 10/21/15 0515  BUN 66* 45*  CREATININE 2.95*  3.01* 1.87*  NA 140 135  K 3.9 3.9  MG  --  1.5*  PHOS  --  3.1   Glucose Profile:   Recent Labs  10/21/15 1121  GLUCAP 206*   Nutritional Anemia Profile:  CBC Latest Ref Rng 10/21/2015 10/20/2015 10/20/2015  WBC 3.6 - 11.0 K/uL 38.8(H) 47.1(H) 50.5(HH)  Hemoglobin 12.0 - 16.0 g/dL 13.3 13.6 16.4(H)  Hematocrit 35.0 - 47.0 % 40.2 43.1 49.9(H)  Platelets 150 - 440 K/uL 23(LL) 46(L) 62(L)    Meds: ss novolog, sodium bicarb drip at 150 ml/hr, vasopressin, levophed  Height:   Ht Readings from Last 1 Encounters:  10/20/15 '5\' 7"'$  (1.702 m)    Weight:   Wt Readings from Last 1 Encounters:  10/21/15 270 lb 15.1 oz (122.9 kg)   BMI:  Body mass index is 42.43 kg/(m^2).  Estimated Nutritional Needs:   Kcal:  7124-5809 kcals (11-14 kcals/kg) using wt of 122.9 kg  Protein:  122-153 g (2.0-2.5 g/kg) using IBW 61 kg  Fluid:   1525-1830 mL (25-30 ml/kg)   HIGH Care Level  Kerman Passey MS, RD, LDN [phone] Pager  [phone] Weekend/On-Call Pager

## 2015-10-21 NOTE — Progress Notes (Signed)
[facility]  ROUNDING NOTE   Subjective:   CRRT. No UF. BFR 250. Platelets dropped to 23. Wbc improved to 38.8  Norepinephrine and vasopressin gtt.   Blood cultures with proteus and enterobacter species  Objective:  Vital signs in last 24 hours:  Temp:  [96.8 F (36 C)-100.8 F (38.2 C)] 99.5 F (37.5 C) (01/25 0900) Pulse Rate:  [31-171] 100 (01/25 0900) Resp:  [18-34] 30 (01/25 0900) BP: (64-153)/(31-101) 104/78 mmHg (01/25 0900) SpO2:  [92 %-100 %] 98 % (01/25 0900) FiO2 (%):  [80 %-100 %] 80 % (01/25 0746) Weight:  [113 kg (249 lb 1.9 oz)-122.9 kg (270 lb 15.1 oz)] 122.9 kg (270 lb 15.1 oz) (01/25 0500)  Weight change:  Filed Weights   10/20/15 1022 10/20/15 1700 10/21/15 0500  Weight: 113.309 kg (249 lb 12.8 oz) 113 kg (249 lb 1.9 oz) 122.9 kg (270 lb 15.1 oz)    Intake/Output: I/O last 3 completed shifts: In: 3422.7 [I.V.:3422.7] Out: 365 [Urine:365]   Intake/Output this shift:  Total I/O In: 495.6 [I.V.:495.6] Out: 10 [Urine:10]  Physical Exam: General: Critically ill, intubated, sedated  Head: +ETT, +OGT  Eyes: Eyes closed, reactive to light, +icterus  Neck: RIJ temp HD catheter, L external jugular peripheral line  Lungs:  PRVC FiO2 60%  Heart: Irregular rhythm, tachycardia  Abdomen:  Soft, nontender, obese  Extremities: 1+ peripheral edema.  Neurologic: Sedated, intubated  Skin: mottling  Access: RIJ vascath 10/20/15 Dr. Stevenson Clinch    Basic Metabolic Panel:  Recent Labs Lab 10/20/15 0957 10/21/15 0515  NA 140 135  K 3.9 3.9  CL 101 103  CO2 20* 18*  GLUCOSE 185* 263*  BUN 66* 45*  CREATININE 2.95*  3.01* 1.87*  CALCIUM 9.0 7.3*  MG  --  1.5*  PHOS  --  3.1    Liver Function Tests:  Recent Labs Lab 10/20/15 0957 10/21/15 0515  AST 124* 312*  ALT 63* 176*  ALKPHOS 567* 391*  BILITOT 2.5* 3.1*  PROT 7.4 5.3*  ALBUMIN 3.1* 2.1*    Recent Labs Lab 10/20/15 0957  LIPASE 11    Recent Labs Lab 10/20/15 0957   AMMONIA 34    CBC:  Recent Labs Lab 10/20/15 0957 10/20/15 2242 10/21/15 0515  WBC 50.5* 47.1* 38.8*  NEUTROABS 47.0*  --   --   HGB 16.4* 13.6 13.3  HCT 49.9* 43.1 40.2  MCV 90.2 95.0 90.8  PLT 62* 46* 23*    Cardiac Enzymes:  Recent Labs Lab 10/20/15 0957 10/20/15 1010 10/20/15 2242  CKTOTAL  --  1562* 779*  TROPONINI 1.21*  --  1.08*    BNP: Invalid input(s): POCBNP  CBG: No results for input(s): GLUCAP in the last 168 hours.  Microbiology: Results for orders placed or performed during the hospital encounter of 10/20/15  Urine culture     Status: None (Preliminary result)   Collection Time: 10/20/15  9:57 AM  Result Value Ref Range Status   Specimen Description URINE, RANDOM  Final   Special Requests NONE  Final   Culture   Final    >=100,000 COLONIES/mL PROTEUS MIRABILIS SUSCEPTIBILITIES TO FOLLOW    Report Status PENDING  Incomplete  Culture, blood (routine x 2)     Status: None (Preliminary result)   Collection Time: 10/20/15  9:57 AM  Result Value Ref Range Status   Specimen Description BLOOD RIGHT WRIST  Final   Special Requests   Final    BOTTLES DRAWN AEROBIC AND ANAEROBIC ANA  1ML AER 4ML   Culture  Setup Time   Final    GRAM NEGATIVE RODS IN BOTH AEROBIC AND ANAEROBIC BOTTLES CRITICAL RESULT CALLED TO, READ BACK BY AND VERIFIED WITH: MATT MCBANE ON 10/21/15 AT 0005 [facility] CONFIRMED BY [facility]    Culture   Final    PROTEUS MIRABILIS SUSCEPTIBILITIES TO FOLLOW IN BOTH AEROBIC AND ANAEROBIC BOTTLES    Report Status PENDING  Incomplete  Culture, blood (routine x 2)     Status: None (Preliminary result)   Collection Time: 10/20/15  9:57 AM  Result Value Ref Range Status   Specimen Description BLOOD LEFT HAND  Final   Special Requests   Final    BOTTLES DRAWN AEROBIC AND ANAEROBIC AER 3ML ANA 2ML   Culture  Setup Time   Final    GRAM NEGATIVE RODS IN BOTH AEROBIC AND ANAEROBIC BOTTLES CRITICAL VALUE NOTED.  VALUE IS CONSISTENT WITH PREVIOUSLY  REPORTED AND CALLED VALUE.    Culture   Final    PROTEUS MIRABILIS IN BOTH AEROBIC AND ANAEROBIC BOTTLES SUSCEPTIBILITIES TO FOLLOW    Report Status PENDING  Incomplete  Blood Culture ID Panel (Reflexed)     Status: Abnormal   Collection Time: 10/20/15  9:57 AM  Result Value Ref Range Status   Enterococcus species NOT DETECTED NOT DETECTED Final   Listeria monocytogenes NOT DETECTED NOT DETECTED Final   Staphylococcus species NOT DETECTED NOT DETECTED Final   Staphylococcus aureus NOT DETECTED NOT DETECTED Final   Streptococcus species NOT DETECTED NOT DETECTED Final   Streptococcus agalactiae NOT DETECTED NOT DETECTED Final   Streptococcus pneumoniae NOT DETECTED NOT DETECTED Final   Streptococcus pyogenes NOT DETECTED NOT DETECTED Final   Acinetobacter baumannii NOT DETECTED NOT DETECTED Final   Enterobacteriaceae species DETECTED (A) NOT DETECTED Final    Comment: CRITICAL RESULT CALLED TO, READ BACK BY AND VERIFIED WITH: MATT MCBANE ON 10/21/15 AT 0005 [place]    Enterobacter cloacae complex NOT DETECTED NOT DETECTED Final   Escherichia coli NOT DETECTED NOT DETECTED Final   Klebsiella oxytoca NOT DETECTED NOT DETECTED Final   Klebsiella pneumoniae NOT DETECTED NOT DETECTED Final   Proteus species (A) NOT DETECTED Final    CRITICAL RESULT CALLED TO, READ BACK BY AND VERIFIED WITH:    Comment: MATT MCBANE ON 10/21/15 AT 0005 [place]   Serratia marcescens NOT DETECTED NOT DETECTED Final   Haemophilus influenzae NOT DETECTED NOT DETECTED Final   Neisseria meningitidis NOT DETECTED NOT DETECTED Final   Pseudomonas aeruginosa NOT DETECTED NOT DETECTED Final   Candida albicans NOT DETECTED NOT DETECTED Final   Candida glabrata NOT DETECTED NOT DETECTED Final   Candida krusei NOT DETECTED NOT DETECTED Final   Candida parapsilosis NOT DETECTED NOT DETECTED Final   Candida tropicalis NOT DETECTED NOT DETECTED Final   Carbapenem resistance NOT DETECTED NOT DETECTED Final   Methicillin  resistance NOT DETECTED NOT DETECTED Final   Vancomycin resistance NOT DETECTED NOT DETECTED Final  MRSA PCR Screening     Status: None   Collection Time: 10/20/15  7:41 PM  Result Value Ref Range Status   MRSA by PCR NEGATIVE NEGATIVE Final    Comment:        The GeneXpert MRSA Assay (FDA approved for NASAL specimens only), is one component of a comprehensive MRSA colonization surveillance program. It is not intended to diagnose MRSA infection nor to guide or monitor treatment for MRSA infections.     Coagulation Studies:  Recent Labs  10/20/15  0957 10/21/15 0023  LABPROT 17.8* 18.4*  INR 1.46 1.52    Urinalysis:  Recent Labs  10/20/15 0957  COLORURINE RED*  LABSPEC 1.017  PHURINE 6.0  GLUCOSEU NEGATIVE  HGBUR 3+*  BILIRUBINUR NEGATIVE  KETONESUR TRACE*  PROTEINUR 100*  NITRITE NEGATIVE  LEUKOCYTESUR 2+*      Imaging: Ct Abdomen Pelvis Wo Contrast  10/20/2015  CLINICAL DATA:  Patient had not been hurt from for couple days, was found by EMS between the wall and the bed, responsive to pain, leukocytosis, elevated glucose and creatinine, abdominal swelling, pelvic pain EXAM: CT ABDOMEN AND PELVIS WITHOUT CONTRAST TECHNIQUE: Multidetector CT imaging of the abdomen and pelvis was performed following the standard protocol without IV contrast. Sagittal and coronal MPR images reconstructed from axial data set. COMPARISON:  None FINDINGS: Bibasilar atelectasis. Calcifications at LEFT lung base may represent calcified granuloma within atelectatic lower lobe. Marked RIGHT hydronephrosis without definite ureteral calcification or dilatation. Edema surrounding the RIGHT kidney can be related to obstruction. Small nonobstructing calculus 4 mm diameter RIGHT kidney image 51. Beam hardening artifacts from patient's arms traverse upper abdomen. Within limits of noncontrast technique and beam hardening artifacts, no definite additional abnormalities of the liver, spleen, atrophic  pancreas, or kidneys. BILATERAL thickened adrenal glands without discrete mass. Gallbladder distended without mass or surrounding inflammatory changes. Extensive colonic diverticulosis without evidence of diverticulitis. Stomach and remaining bowel loops normal appearance. Small umbilical hernia containing fat. Normal appendix. Unremarkable uterus and RIGHT adnexa. LEFT ovary prominent size for age at 3.8 x 3.6 x 3.6 cm. Bladder decompressed by Foley catheter. Shotty retroperitoneal nodes without adenopathy. No mass, adenopathy, free air or free fluid. Scattered atherosclerotic calcifications. Bones demineralized. IMPRESSION: RIGHT hydronephrosis question UPJ obstruction; in the setting of leukocytosis, recommend correlation with urinalysis to exclude urinary tract infection. Scattered colonic diverticulosis. Prominent LEFT ovarian size for age ; non emergent followup pelvic and transvaginal sonography recommended to characterize. Electronically Signed   By: Lavonia Dana M.D.   On: 10/20/2015 12:50   Dg Chest 1 View  10/20/2015  CLINICAL DATA:  Patient found down and minimally responsive 10/20/2015. Status post central line placement. EXAM: CHEST 1 VIEW COMPARISON:  Single view of the chest earlier today. FINDINGS: A new right IJ catheter is in place with the tip projecting over the lower superior vena cava. There is no pneumothorax. Endotracheal tube is again seen. Defibrillator pad is in place. There is no pneumothorax. Small left effusion and basilar atelectasis are identified. Mild interstitial edema. IMPRESSION: Right IJ catheter tip projects over the lower superior vena cava. Negative for pneumothorax. Mild interstitial edema with a small left effusion and basilar atelectasis. Electronically Signed   By: Inge Rise M.D.   On: 10/20/2015 18:46   Dg Abd 1 View  10/21/2015  CLINICAL DATA:  Orogastric tube placement. EXAM: ABDOMEN - 1 VIEW COMPARISON:  CT 10/20/2015. FINDINGS: Orogastric tube noted  projected over the distal stomach. No bowel distention. Degenerative changes lumbar spine. IMPRESSION: Orogastric tube noted with its tip the left projected over the distal stomach . No bowel distention. Electronically Signed   By: Gle[place]   On: 10/21/2015 07:21   Ct Head Wo Contrast  10/20/2015  CLINICAL DATA:  Found unresponsive today. EXAM: CT HEAD WITHOUT CONTRAST CT CERVICAL SPINE WITHOUT CONTRAST TECHNIQUE: Multidetector CT imaging of the head and cervical spine was performed following the standard protocol without intravenous contrast. Multiplanar CT image reconstructions of the cervical spine were also generated. COMPARISON:  None. FINDINGS:  CT HEAD FINDINGS Normal appearing cerebral hemispheres and posterior fossa structures. Normal size and position of the ventricles. No intracranial hemorrhage, mass lesion or CT evidence of acute infarction. Unremarkable bones. Mild mucosal thickening involving all of the paranasal sinuses with a small amount of fluid in the sphenoid sinus bilaterally. CT CERVICAL SPINE FINDINGS An endotracheal tube is in place. Multilevel degenerative changes. Straightening of the normal cervical lordosis. No prevertebral soft tissue swelling, fractures or subluxations. Bilateral carotid artery calcifications. IMPRESSION: 1. No acute intracranial abnormality. 2. No cervical spine fracture or subluxation. 3. Mild chronic pansinusitis with an acute component in the sphenoid sinuses. 4. Cervical spine degenerative changes. 5. Bilateral carotid artery atheromatous calcifications. Electronically Signed   By: Claudie Revering M.D.   On: 10/20/2015 13:25   Ct Cervical Spine Wo Contrast  10/20/2015  CLINICAL DATA:  Found unresponsive today. EXAM: CT HEAD WITHOUT CONTRAST CT CERVICAL SPINE WITHOUT CONTRAST TECHNIQUE: Multidetector CT imaging of the head and cervical spine was performed following the standard protocol without intravenous contrast. Multiplanar CT image reconstructions of  the cervical spine were also generated. COMPARISON:  None. FINDINGS: CT HEAD FINDINGS Normal appearing cerebral hemispheres and posterior fossa structures. Normal size and position of the ventricles. No intracranial hemorrhage, mass lesion or CT evidence of acute infarction. Unremarkable bones. Mild mucosal thickening involving all of the paranasal sinuses with a small amount of fluid in the sphenoid sinus bilaterally. CT CERVICAL SPINE FINDINGS An endotracheal tube is in place. Multilevel degenerative changes. Straightening of the normal cervical lordosis. No prevertebral soft tissue swelling, fractures or subluxations. Bilateral carotid artery calcifications. IMPRESSION: 1. No acute intracranial abnormality. 2. No cervical spine fracture or subluxation. 3. Mild chronic pansinusitis with an acute component in the sphenoid sinuses. 4. Cervical spine degenerative changes. 5. Bilateral carotid artery atheromatous calcifications. Electronically Signed   By: Claudie Revering M.D.   On: 10/20/2015 13:25   Dg Chest Port 1 View  10/20/2015  CLINICAL DATA:  Hypoxia. EXAM: PORTABLE CHEST 1 VIEW COMPARISON:  None. FINDINGS: Endotracheal tube tip is 4.5 cm above the carina. No pneumothorax. There is no edema or consolidation. Heart size and pulmonary vascularity within normal limits. No adenopathy. No bone lesions. IMPRESSION: Endotracheal tube as described without pneumothorax. No edema or consolidation. Electronically Signed   By: Lowella Grip III M.D.   On: 10/20/2015 10:53     Medications:   . amiodarone 60 mg/hr (10/21/15 0859)  . fentaNYL infusion INTRAVENOUS 350 mcg/hr (10/21/15 0800)  . midazolam (VERSED) infusion Stopped (10/20/15 1045)  . norepinephrine (LEVOPHED) Adult infusion 64 mcg/min (10/21/15 0901)  . pureflow 2,000 mL/hr at 10/21/15 0409  .  sodium bicarbonate  infusion 1000 mL 100 mL/hr at 10/21/15 0500  . vasopressin (PITRESSIN) infusion - *FOR SHOCK* 0.03 Units/min (10/21/15 0600)   .  antiseptic oral rinse  7 mL Mouth Rinse 6 times per day  . chlorhexidine gluconate  15 mL Mouth/Throat BID  . pantoprazole (PROTONIX) IV  40 mg Intravenous QHS  . piperacillin-tazobactam (ZOSYN)  IV  3.375 g Intravenous 3 times per day   acetaminophen **OR** acetaminophen, heparin, midazolam, ondansetron **OR** ondansetron (ZOFRAN) IV  Assessment/ Plan:  Ms. RONELLA PLUNK is a 70 y.o. white female with unknown past medications history, who was admitted to [facility] on 10/20/2015  1. Acute renal failure with metabolic acidosis: oliguric with hematuria, proteinuria and pyuria. Hemodynamically unstable on vasopressors: norepinephrine and vasopressin. Unknown baseline creatinine.  CT with right sided perinephric edema. Ultrasound pending. Foley placed.  Acute renal failure from sepsis and rhabdomyolysis.  - continue CRRT 4K bath DFR 2K BFR 250 - monitor volume status, urine output, electrolytes and renal function - serologic testing today: HIV, hepatitis panel, ANA, ANCA, anti-GBM and serum complements.   2. Sepsis/hypotension: wbc slowing improving but still critically high. Afebrile.  Blood cultures with proteus and enterobacter species  Urine and blood cultures pending  - Norepinephrine and vasopressin - empiric pip/tazo and vanco  3. Atrial fibrillation: new onset  - amiodarone gtt.  - echocardiogram pending   LOS: 1 Takyra Cantrall 1/25/20179:49 AM

## 2015-10-21 NOTE — Progress Notes (Signed)
[place] Progress Note Patient Name: April [name] DOB: September 17, 1946 MRN: [id number]   Date of Service  10/21/2015  HPI/Events of Note  Patient is on Fentanyl IV infusion at 300 mcg/hour. Will wake up intermittently and reach for ETT.   eICU Interventions  Will order: 1. Versed 1 mg IV Q 1 hour PRN.     Intervention Category Minor Interventions: Agitation / anxiety - evaluation and management;Clinical assessment - ordering diagnostic tests  Marquist Binstock Cornelia Copa 10/21/2015, 5:14 AM

## 2015-10-21 NOTE — Progress Notes (Addendum)
PULMONARY / CRITICAL CARE MEDICINE   Name: ANGELO CAROLL MRN: [id number] DOB: 14-Sep-1946    ADMISSION DATE:  10/20/2015  Consulting physician - Dr. Fritzi Mandes  BRIEF HISTORY: 70 y.o. female with unknown past medical history was picked up by EMS today after being found at her house laying on the floor minimally responsive. Per coworkers she has not been to work for 2 days and when they checked on her they found her lying in the floor minimally responsive. EMS was called. Upon arrival to the ED, she only withdrew to pain, due to low GCS and inability to protect her airway she was intubated.   SUBJECTIVE:  Started on CRRT lastnight, still requiring pressors, plts low this AM.  Per RN and review of chart, patient more awake and agitated lastnight, may have attempted to pull ETT, sedation increased.   STUDIES:  1/24 CT head and cspine - no acute findings 1/25 CT A/P - RIGHT hydronephrosis question UPJ obstruction; in the setting of leukocytosis, recommend correlation with urinalysis to exclude urinary tract infection.  SIGNIFICANT EVENTS: 1/24>>found down, brought to [facility] ED, intubated and started on CRRT 1/24>>RIJ Vascath  VITAL SIGNS: Temp:  [96.8 F (36 C)-100.8 F (38.2 C)] 99.5 F (37.5 C) (01/25 0900) Pulse Rate:  [31-171] 100 (01/25 0900) Resp:  [18-34] 30 (01/25 0900) BP: (64-153)/(31-101) 104/78 mmHg (01/25 0900) SpO2:  [92 %-100 %] 98 % (01/25 0900) FiO2 (%):  [80 %-100 %] 80 % (01/25 0746) Weight:  [249 lb 1.9 oz (113 kg)-270 lb 15.1 oz (122.9 kg)] 270 lb 15.1 oz (122.9 kg) (01/25 0500) HEMODYNAMICS:   VENTILATOR SETTINGS: Vent Mode:  [-] PRVC FiO2 (%):  [80 %-100 %] 80 % Set Rate:  [18 bmp-30 bmp] 30 bmp Vt Set:  [500 mL] 500 mL PEEP:  [5 cmH20] 5 cmH20 INTAKE / OUTPUT:  Intake/Output Summary (Last 24 hours) at 10/21/15 4656 Last data filed at 10/21/15 0901  Gross per 24 hour  Intake 3918.33 ml  Output    375 ml  Net 3543.33 ml    Review of Systems  Unable  to perform ROS: intubated    Physical Exam  Constitutional: She appears distressed.  HENT:  Head: Normocephalic and atraumatic.  Right Ear: External ear normal.  Left Ear: External ear normal.  Eyes: EOM are normal. Pupils are equal, round, and reactive to light.  Neck: Normal range of motion. No JVD present. No tracheal deviation present. No thyromegaly present.  Cardiovascular: Normal rate, regular rhythm and normal heart sounds.   No murmur heard. Pulmonary/Chest: She has no wheezes. She has no rales.  Intubated, coarse upper airway sounds  Abdominal: Soft. Bowel sounds are normal. She exhibits no distension.  Musculoskeletal: She exhibits edema. She exhibits no tenderness.  Neurological:  Intubated and sedated Withdraws from pain  Skin: Skin is dry.  Skin mottling has improved, now limited b/l legs  Nursing note and vitals reviewed.    LABS:  CBC  Recent Labs Lab 10/20/15 0957 10/20/15 2242 10/21/15 0515  WBC 50.5* 47.1* 38.8*  HGB 16.4* 13.6 13.3  HCT 49.9* 43.1 40.2  PLT 62* 46* 23*   Coag's  Recent Labs Lab 10/20/15 0957 10/21/15 0023  APTT 35  --   INR 1.46 1.52   BMET  Recent Labs Lab 10/20/15 0957 10/21/15 0515  NA 140 135  K 3.9 3.9  CL 101 103  CO2 20* 18*  BUN 66* 45*  CREATININE 2.95*  3.01* 1.87*  GLUCOSE 185* 263*  Electrolytes  Recent Labs Lab 10/20/15 0957 10/21/15 0515  CALCIUM 9.0 7.3*  MG  --  1.5*  PHOS  --  3.1   Sepsis Markers  Recent Labs Lab 10/20/15 0957 10/20/15 1254 10/21/15 0515  LATICACIDVEN 6.6* 5.9* 3.7*   ABG  Recent Labs Lab 10/20/15 2035 10/21/15 0847  PHART 7.13* 7.35  PCO2ART 41 33  PO2ART 103 193*   Liver Enzymes  Recent Labs Lab 10/20/15 0957 10/21/15 0515  AST 124* 312*  ALT 63* 176*  ALKPHOS 567* 391*  BILITOT 2.5* 3.1*  ALBUMIN 3.1* 2.1*   Cardiac Enzymes  Recent Labs Lab 10/20/15 0957 10/20/15 2242  TROPONINI 1.21* 1.08*   Glucose No results for input(s):  GLUCAP in the last 168 hours.  Imaging Ct Abdomen Pelvis Wo Contrast  10/20/2015  CLINICAL DATA:  Patient had not been hurt from for couple days, was found by EMS between the wall and the bed, responsive to pain, leukocytosis, elevated glucose and creatinine, abdominal swelling, pelvic pain EXAM: CT ABDOMEN AND PELVIS WITHOUT CONTRAST TECHNIQUE: Multidetector CT imaging of the abdomen and pelvis was performed following the standard protocol without IV contrast. Sagittal and coronal MPR images reconstructed from axial data set. COMPARISON:  None FINDINGS: Bibasilar atelectasis. Calcifications at LEFT lung base may represent calcified granuloma within atelectatic lower lobe. Marked RIGHT hydronephrosis without definite ureteral calcification or dilatation. Edema surrounding the RIGHT kidney can be related to obstruction. Small nonobstructing calculus 4 mm diameter RIGHT kidney image 51. Beam hardening artifacts from patient's arms traverse upper abdomen. Within limits of noncontrast technique and beam hardening artifacts, no definite additional abnormalities of the liver, spleen, atrophic pancreas, or kidneys. BILATERAL thickened adrenal glands without discrete mass. Gallbladder distended without mass or surrounding inflammatory changes. Extensive colonic diverticulosis without evidence of diverticulitis. Stomach and remaining bowel loops normal appearance. Small umbilical hernia containing fat. Normal appendix. Unremarkable uterus and RIGHT adnexa. LEFT ovary prominent size for age at 3.8 x 3.6 x 3.6 cm. Bladder decompressed by Foley catheter. Shotty retroperitoneal nodes without adenopathy. No mass, adenopathy, free air or free fluid. Scattered atherosclerotic calcifications. Bones demineralized. IMPRESSION: RIGHT hydronephrosis question UPJ obstruction; in the setting of leukocytosis, recommend correlation with urinalysis to exclude urinary tract infection. Scattered colonic diverticulosis. Prominent LEFT  ovarian size for age ; non emergent followup pelvic and transvaginal sonography recommended to characterize. Electronically Signed   By: Lavonia Dana M.D.   On: 10/20/2015 12:50   Dg Chest 1 View  10/20/2015  CLINICAL DATA:  Patient found down and minimally responsive 10/20/2015. Status post central line placement. EXAM: CHEST 1 VIEW COMPARISON:  Single view of the chest earlier today. FINDINGS: A new right IJ catheter is in place with the tip projecting over the lower superior vena cava. There is no pneumothorax. Endotracheal tube is again seen. Defibrillator pad is in place. There is no pneumothorax. Small left effusion and basilar atelectasis are identified. Mild interstitial edema. IMPRESSION: Right IJ catheter tip projects over the lower superior vena cava. Negative for pneumothorax. Mild interstitial edema with a small left effusion and basilar atelectasis. Electronically Signed   By: Inge Rise M.D.   On: 10/20/2015 18:46   Dg Abd 1 View  10/21/2015  CLINICAL DATA:  Orogastric tube placement. EXAM: ABDOMEN - 1 VIEW COMPARISON:  CT 10/20/2015. FINDINGS: Orogastric tube noted projected over the distal stomach. No bowel distention. Degenerative changes lumbar spine. IMPRESSION: Orogastric tube noted with its tip the left projected over the distal stomach . No bowel  distention. Electronically Signed   By: George   On: 10/21/2015 07:21   Ct Head Wo Contrast  10/20/2015  CLINICAL DATA:  Found unresponsive today. EXAM: CT HEAD WITHOUT CONTRAST CT CERVICAL SPINE WITHOUT CONTRAST TECHNIQUE: Multidetector CT imaging of the head and cervical spine was performed following the standard protocol without intravenous contrast. Multiplanar CT image reconstructions of the cervical spine were also generated. COMPARISON:  None. FINDINGS: CT HEAD FINDINGS Normal appearing cerebral hemispheres and posterior fossa structures. Normal size and position of the ventricles. No intracranial hemorrhage, mass lesion  or CT evidence of acute infarction. Unremarkable bones. Mild mucosal thickening involving all of the paranasal sinuses with a small amount of fluid in the sphenoid sinus bilaterally. CT CERVICAL SPINE FINDINGS An endotracheal tube is in place. Multilevel degenerative changes. Straightening of the normal cervical lordosis. No prevertebral soft tissue swelling, fractures or subluxations. Bilateral carotid artery calcifications. IMPRESSION: 1. No acute intracranial abnormality. 2. No cervical spine fracture or subluxation. 3. Mild chronic pansinusitis with an acute component in the sphenoid sinuses. 4. Cervical spine degenerative changes. 5. Bilateral carotid artery atheromatous calcifications. Electronically Signed   By: Claudie Revering M.D.   On: 10/20/2015 13:25   Ct Cervical Spine Wo Contrast  10/20/2015  CLINICAL DATA:  Found unresponsive today. EXAM: CT HEAD WITHOUT CONTRAST CT CERVICAL SPINE WITHOUT CONTRAST TECHNIQUE: Multidetector CT imaging of the head and cervical spine was performed following the standard protocol without intravenous contrast. Multiplanar CT image reconstructions of the cervical spine were also generated. COMPARISON:  None. FINDINGS: CT HEAD FINDINGS Normal appearing cerebral hemispheres and posterior fossa structures. Normal size and position of the ventricles. No intracranial hemorrhage, mass lesion or CT evidence of acute infarction. Unremarkable bones. Mild mucosal thickening involving all of the paranasal sinuses with a small amount of fluid in the sphenoid sinus bilaterally. CT CERVICAL SPINE FINDINGS An endotracheal tube is in place. Multilevel degenerative changes. Straightening of the normal cervical lordosis. No prevertebral soft tissue swelling, fractures or subluxations. Bilateral carotid artery calcifications. IMPRESSION: 1. No acute intracranial abnormality. 2. No cervical spine fracture or subluxation. 3. Mild chronic pansinusitis with an acute component in the sphenoid  sinuses. 4. Cervical spine degenerative changes. 5. Bilateral carotid artery atheromatous calcifications. Electronically Signed   By: Claudie Revering M.D.   On: 10/20/2015 13:25   Dg Chest Port 1 View  10/20/2015  CLINICAL DATA:  Hypoxia. EXAM: PORTABLE CHEST 1 VIEW COMPARISON:  None. FINDINGS: Endotracheal tube tip is 4.5 cm above the carina. No pneumothorax. There is no edema or consolidation. Heart size and pulmonary vascularity within normal limits. No adenopathy. No bone lesions. IMPRESSION: Endotracheal tube as described without pneumothorax. No edema or consolidation. Electronically Signed   By: Lowella Grip III M.D.   On: 10/20/2015 10:53    Cultures: BCx2 x2 1/24>> UC 1/24>> Sputum  Antibiotics: Vanc 1/24>> Zosyn 1/24>>  Lines: RIJ Vascath 1/24>>  ASSESSMENT / PLAN: 70 yo Female with no sig PMHX, found down and unresponsive, intubated in the ED for respiratory distress, found to have leukocytosis, NSTEMI, UA concerning for infection, respiratory failure, possible rhabdomyolysis.   PULMONARY OETT 7.39m Respiratory failure - hypoxic Septic shock P:  - cont with MV - prn ABG - wean MV as tolerated - maintain sats >90% - WUA\SBT daily  CARDIOVASCULAR CVL - RIJ VASCATH Shock - septic hypotension NSTEMI Afib - started on amiodarone 1/24  P:  - maintain MAP >65 - vasopressors as needed  - monitor BP - trend  CE - heparin gtt on hold due to low plt - mostly likely demand ischemia - EKG reviewed, no sig ST changes.  - cont with amiodarone - cardiology consult pending.   RENAL Anuria ARF UTI ?Rhabdomyolysis - CK improving, unlikely rhabdo Right hydronephrosis P:  -IVF - monitor Cr - getting crrt - appreciate nephro recs.  - follow up Ur Cx -current CK trending down, cont to follow, IVF -Etoh, salicylate levels normal  - U/S of Right Kidney with mild R hydro, WBC improving, pressors are being wean down - CT A/P was done prior to foley placement,  bladder is now decompressed.   GASTROINTESTINAL SUP - PPI Elevated LFTs - trend Start TF today  HEMATOLOGIC Leukocytosis Thrombocytopenia - lower today P:  -most likely related to septic shock - possible source urine -follow CBC -cont abx -stop heparin  INFECTIOUS Sepsis - possible source UTI  P:  - cont with current abx - follow up bld and ur cx  ENDOCRINE ICU hypo/hyperglycemia protocol  NEUROLOGIC A: Acute encephalopathy P:  RASS goal: -1 - metabolic encephalopathy - cont to monitor neuro status.    Thank you for consulting [facility], Please feel free to contacts [place] with any questions at [phone] (please enter 7-digits).  I have personally obtained a history, examined the patient, evaluated laboratory and imaging results, formulated the assessment and plan and placed orders.  The Patient requires high complexity decision making for assessment and support, frequent evaluation and titration of therapies, application of advanced monitoring technologies and extensive interpretation of multiple databases. Critical Care Time devoted to patient care services described in this note is 45 minutes.   Overall, patient is critically ill, prognosis is guarded. Patient at high risk for cardiac arrest and death.   Vilinda Boehringer, MD [facility] Pager [phone] (please enter 7-digits) On Call Pager [phone] (please enter 7-digits)  Note: This note was prepared with Dragon dictation along with smaller phrase technology. Any transcriptional errors that result from this process are unintentional.

## 2015-10-21 NOTE — Progress Notes (Addendum)
Central line placed to left IJ by Dr. Lucky Cowboy. During first central line attempt by Dr. Stevenson Clinch and central line placement by Dr. Lucky Cowboy patient's sats dropped down into low 80's while patient was lying flat and patient was given 100% fio2 to supplement oxygen needs. o2 sats returned to upper 90's when head of the bed was elevated.

## 2015-10-21 NOTE — Progress Notes (Signed)
[facility] [place] NAME: April [name]    MR#:  [id number]  DATE OF BIRTH:  [birth year]  SUBJECTIVE:  Patient remains intubated. She was agitated and sedation was increased.  REVIEW OF SYSTEMS:    Review of Systems  Unable to perform ROS   Tolerating Diet:NPO      DRUG ALLERGIES:   Allergies  Allergen Reactions  . Prednisone Anaphylaxis    VITALS:  Blood pressure 104/75, pulse 115, temperature 99.1 F (37.3 C), temperature source Core (Comment), resp. rate 30, height '5\' 7"'$  (1.702 m), weight 122.9 kg (270 lb 15.1 oz), SpO2 100 %.  PHYSICAL EXAMINATION:   Physical Exam  Constitutional: She is well-developed, well-nourished, and in no distress. No distress.  HENT:  Head: Normocephalic.  Eyes: No scleral icterus.  Neck: No JVD present. No tracheal deviation present.  Cardiovascular: Normal rate, regular rhythm and normal heart sounds.  Exam reveals no gallop and no friction rub.   No murmur heard. Pulmonary/Chest: Effort normal and breath sounds normal. No respiratory distress. She has no wheezes. She has no rales. She exhibits no tenderness.  Abdominal: Soft. Bowel sounds are normal. She exhibits no distension and no mass. There is no tenderness. There is no rebound and no guarding.  Musculoskeletal: She exhibits no edema.  Neurological:  Patient intubated and sedated on vent  Skin: Skin is warm. No rash noted. No erythema.      LABORATORY PANEL:   CBC  Recent Labs Lab 10/21/15 0515  WBC 38.8*  HGB 13.3  HCT 40.2  PLT 23*   ------------------------------------------------------------------------------------------------------------------  Chemistries   Recent Labs Lab 10/21/15 0515  NA 135  K 3.9  CL 103  CO2 18*  GLUCOSE 263*  BUN 45*  CREATININE 1.87*  CALCIUM 7.3*  MG 1.5*  AST 312*  ALT 176*  ALKPHOS 391*  BILITOT 3.1*    ------------------------------------------------------------------------------------------------------------------  Cardiac Enzymes  Recent Labs Lab 10/20/15 0957 10/20/15 2242  TROPONINI 1.21* 1.08*   ------------------------------------------------------------------------------------------------------------------  RADIOLOGY:  Ct Abdomen Pelvis Wo Contrast  10/20/2015  CLINICAL DATA:  Patient had not been hurt from for couple days, was found by EMS between the wall and the bed, responsive to pain, leukocytosis, elevated glucose and creatinine, abdominal swelling, pelvic pain EXAM: CT ABDOMEN AND PELVIS WITHOUT CONTRAST TECHNIQUE: Multidetector CT imaging of the abdomen and pelvis was performed following the standard protocol without IV contrast. Sagittal and coronal MPR images reconstructed from axial data set. COMPARISON:  None FINDINGS: Bibasilar atelectasis. Calcifications at LEFT lung base may represent calcified granuloma within atelectatic lower lobe. Marked RIGHT hydronephrosis without definite ureteral calcification or dilatation. Edema surrounding the RIGHT kidney can be related to obstruction. Small nonobstructing calculus 4 mm diameter RIGHT kidney image 51. Beam hardening artifacts from patient's arms traverse upper abdomen. Within limits of noncontrast technique and beam hardening artifacts, no definite additional abnormalities of the liver, spleen, atrophic pancreas, or kidneys. BILATERAL thickened adrenal glands without discrete mass. Gallbladder distended without mass or surrounding inflammatory changes. Extensive colonic diverticulosis without evidence of diverticulitis. Stomach and remaining bowel loops normal appearance. Small umbilical hernia containing fat. Normal appendix. Unremarkable uterus and RIGHT adnexa. LEFT ovary prominent size for age at 3.8 x 3.6 x 3.6 cm. Bladder decompressed by Foley catheter. Shotty retroperitoneal nodes without adenopathy. No mass, adenopathy,  free air or free fluid. Scattered atherosclerotic calcifications. Bones demineralized. IMPRESSION: RIGHT hydronephrosis question UPJ obstruction; in the setting of leukocytosis, recommend correlation with urinalysis to  exclude urinary tract infection. Scattered colonic diverticulosis. Prominent LEFT ovarian size for age ; non emergent followup pelvic and transvaginal sonography recommended to characterize. Electronically Signed   By: Lavonia Dana M.D.   On: 10/20/2015 12:50   Dg Chest 1 View  10/20/2015  CLINICAL DATA:  Patient found down and minimally responsive 10/20/2015. Status post central line placement. EXAM: CHEST 1 VIEW COMPARISON:  Single view of the chest earlier today. FINDINGS: A new right IJ catheter is in place with the tip projecting over the lower superior vena cava. There is no pneumothorax. Endotracheal tube is again seen. Defibrillator pad is in place. There is no pneumothorax. Small left effusion and basilar atelectasis are identified. Mild interstitial edema. IMPRESSION: Right IJ catheter tip projects over the lower superior vena cava. Negative for pneumothorax. Mild interstitial edema with a small left effusion and basilar atelectasis. Electronically Signed   By: Inge Rise M.D.   On: 10/20/2015 18:46   Dg Abd 1 View  10/21/2015  CLINICAL DATA:  Orogastric tube placement. EXAM: ABDOMEN - 1 VIEW COMPARISON:  CT 10/20/2015. FINDINGS: Orogastric tube noted projected over the distal stomach. No bowel distention. Degenerative changes lumbar spine. IMPRESSION: Orogastric tube noted with its tip the left projected over the distal stomach . No bowel distention. Electronically Signed   By: House   On: 10/21/2015 07:21   Ct Head Wo Contrast  10/20/2015  CLINICAL DATA:  Found unresponsive today. EXAM: CT HEAD WITHOUT CONTRAST CT CERVICAL SPINE WITHOUT CONTRAST TECHNIQUE: Multidetector CT imaging of the head and cervical spine was performed following the standard protocol without  intravenous contrast. Multiplanar CT image reconstructions of the cervical spine were also generated. COMPARISON:  None. FINDINGS: CT HEAD FINDINGS Normal appearing cerebral hemispheres and posterior fossa structures. Normal size and position of the ventricles. No intracranial hemorrhage, mass lesion or CT evidence of acute infarction. Unremarkable bones. Mild mucosal thickening involving all of the paranasal sinuses with a small amount of fluid in the sphenoid sinus bilaterally. CT CERVICAL SPINE FINDINGS An endotracheal tube is in place. Multilevel degenerative changes. Straightening of the normal cervical lordosis. No prevertebral soft tissue swelling, fractures or subluxations. Bilateral carotid artery calcifications. IMPRESSION: 1. No acute intracranial abnormality. 2. No cervical spine fracture or subluxation. 3. Mild chronic pansinusitis with an acute component in the sphenoid sinuses. 4. Cervical spine degenerative changes. 5. Bilateral carotid artery atheromatous calcifications. Electronically Signed   By: Claudie Revering M.D.   On: 10/20/2015 13:25   Ct Cervical Spine Wo Contrast  10/20/2015  CLINICAL DATA:  Found unresponsive today. EXAM: CT HEAD WITHOUT CONTRAST CT CERVICAL SPINE WITHOUT CONTRAST TECHNIQUE: Multidetector CT imaging of the head and cervical spine was performed following the standard protocol without intravenous contrast. Multiplanar CT image reconstructions of the cervical spine were also generated. COMPARISON:  None. FINDINGS: CT HEAD FINDINGS Normal appearing cerebral hemispheres and posterior fossa structures. Normal size and position of the ventricles. No intracranial hemorrhage, mass lesion or CT evidence of acute infarction. Unremarkable bones. Mild mucosal thickening involving all of the paranasal sinuses with a small amount of fluid in the sphenoid sinus bilaterally. CT CERVICAL SPINE FINDINGS An endotracheal tube is in place. Multilevel degenerative changes. Straightening of the  normal cervical lordosis. No prevertebral soft tissue swelling, fractures or subluxations. Bilateral carotid artery calcifications. IMPRESSION: 1. No acute intracranial abnormality. 2. No cervical spine fracture or subluxation. 3. Mild chronic pansinusitis with an acute component in the sphenoid sinuses. 4. Cervical spine degenerative changes. 5.  Bilateral carotid artery atheromatous calcifications. Electronically Signed   By: Claudie Revering M.D.   On: 10/20/2015 13:25   US Renal  10/21/2015  CLINICAL DATA:  Acute renal failure. EXAM: RENAL / URINARY TRACT ULTRASOUND COMPLETE COMPARISON:  CT 10/20/2015. FINDINGS: Right Kidney: Length: 12.3 cm. Echogenicity within normal limits. No mass. Mild right hydronephrosis. Left Kidney: Length: 13.0 cm. Echogenicity within normal limits. No mass or hydronephrosis visualized. Bladder: Foley catheter in bladder. Bladder decompressed. Limited exam due to body habitus . IMPRESSION: 1.  Mild right hydronephrosis.  Similar finding noted on prior CT. 2. Foley catheter in bladder.  Bladder decompressed. Electronically Signed   By: [place]   On: 10/21/2015 10:09   Dg Chest Port 1 View  10/20/2015  CLINICAL DATA:  Hypoxia. EXAM: PORTABLE CHEST 1 VIEW COMPARISON:  None. FINDINGS: Endotracheal tube tip is 4.5 cm above the carina. No pneumothorax. There is no edema or consolidation. Heart size and pulmonary vascularity within normal limits. No adenopathy. No bone lesions. IMPRESSION: Endotracheal tube as described without pneumothorax. No edema or consolidation. Electronically Signed   By: Lowella Grip III M.D.   On: 10/20/2015 10:53     ASSESSMENT AND PLAN:    70 year old female presented to the emergency room after she was found lying on the floor with minimal responsiveness.   1. Severe Septic shock, hypovolemic shock: This is due to urosepsis. Patient's currently intubated on ventilator. Patient is on levo fed and vasopressin. Continue vancomycin and  Rocephin. Follow-up on final blood and urine culture. Appreciate pulmonary consultation.  2. Acute renal failure: This is due to ATN in the setting of sepsis and rhabdomyolysis. Patient was and uric. Patient underwent emergent CRRT. CT scan shows right-sided perinephric edema. Appreciate nephrology consultation.  3. Severe leukocytosis: This is due to urosepsis and also likely due to dehydration/anuria. White blood cell has improved. 4. Atrial fibrillation with RVR: Patient is now an amiodarone drip. Patient has elevated troponins which may be due to demand ischemia from acute issues. Cardiology consultation pending. Continue amiodarone drip. Need for anticoagulation needs to be addressed by cardiology. Follow-up on echocardiogram 5. Hyperglycemia: Will order A1c as per diabetes coordinator consultation and sliding scale insulin  6. UTI: Continue Rocephin and follow up on urine culture.  7. Acute rhabdomyolysis secondary to patient being found down on the floor: Continue to trend CPK  8. Elevated troponin: This is due to demand ischemia and not ACS. Follow-up in cardiology    CODE STATUS: full  Critical care TOTAL TIME TAKING CARE OF THIS PATIENT: 40 minutes.   Discussed with Dr.MUNGAL  POSSIBLE D/C ??, DEPENDING ON CLINICAL CONDITION.   Jayley Hustead M.D on 10/21/2015 at 12:10 PM  Between 7am to 6pm - Pager - [phone] After 6pm go to [email or web address] - password [email or web address] [facility]  Office  [phone]  CC: Primary care physician; Albina Billet, MD  Note: This dictation was prepared with Dragon dictation along with smaller phrase technology. Any transcriptional errors that result from this process are unintentional.

## 2015-10-21 NOTE — Progress Notes (Signed)
[place] for CRRT medication management, Ceftriaxone Dosing Indication: CRRT/ Proteus Bacteremia     Allergies  Allergen Reactions  . Prednisone Anaphylaxis    Patient Measurements: Height: '5\' 7"'$  (170.2 cm) Weight: 270 lb 15.1 oz (122.9 kg) IBW/kg (Calculated) : 61.6   Vital Signs: Temp: 99 F (37.2 C) (01/25 1515) BP: 107/80 mmHg (01/25 1515) Pulse Rate: 108 (01/25 1515) Intake/Output from previous day: 01/24 0701 - 01/25 0700 In: 3422.7 [I.V.:3422.7] Out: 365 [Urine:365] Intake/Output from this shift: Total I/O In: 1883.4 [I.V.:1883.4] Out: 166 [Urine:45; Other:121] Vent settings for last 24 hours: Vent Mode:  [-] PRVC FiO2 (%):  [40 %-100 %] 40 % Set Rate:  [18 bmp-30 bmp] 30 bmp Vt Set:  [500 mL] 500 mL PEEP:  [5 cmH20] 5 cmH20  Labs:  Recent Labs  10/20/15 0957 10/20/15 2242 10/21/15 0023 10/21/15 0515  WBC 50.5* 47.1*  --  38.8*  HGB 16.4* 13.6  --  13.3  HCT 49.9* 43.1  --  40.2  PLT 62* 46*  --  23*  APTT 35  --   --   --   INR 1.46  --  1.52  --   CREATININE 2.95*  3.01*  --   --  1.87*  MG  --   --   --  1.5*  PHOS  --   --   --  3.1  ALBUMIN 3.1*  --   --  2.1*  PROT 7.4  --   --  5.3*  AST 124*  --   --  312*  ALT 63*  --   --  176*  ALKPHOS 567*  --   --  391*  BILITOT 2.5*  --   --  3.1*   Estimated Creatinine Clearance: 38.6 mL/min (by C-G formula based on Cr of 1.87).   Recent Labs  10/21/15 1121  GLUCAP 206*    Microbiology: Recent Results (from the past 720 hour(s))  Urine culture     Status: None (Preliminary result)   Collection Time: 10/20/15  9:57 AM  Result Value Ref Range Status   Specimen Description URINE, RANDOM  Final   Special Requests NONE  Final   Culture   Final    >=100,000 COLONIES/mL PROTEUS MIRABILIS SUSCEPTIBILITIES TO FOLLOW    Report Status PENDING  Incomplete  Culture, blood (routine x 2)     Status: None (Preliminary result)   Collection Time:  10/20/15  9:57 AM  Result Value Ref Range Status   Specimen Description BLOOD RIGHT WRIST  Final   Special Requests   Final    BOTTLES DRAWN AEROBIC AND ANAEROBIC ANA 1ML AER 4ML   Culture  Setup Time   Final    GRAM NEGATIVE RODS IN BOTH AEROBIC AND ANAEROBIC BOTTLES CRITICAL RESULT CALLED TO, READ BACK BY AND VERIFIED WITH: MATT [place] ON 10/21/15 AT [address] [facility] CONFIRMED BY [place]    Culture   Final    PROTEUS MIRABILIS SUSCEPTIBILITIES TO FOLLOW IN BOTH AEROBIC AND ANAEROBIC BOTTLES    Report Status PENDING  Incomplete  Culture, blood (routine x 2)     Status: None (Preliminary result)   Collection Time: 10/20/15  9:57 AM  Result Value Ref Range Status   Specimen Description BLOOD LEFT HAND  Final   Special Requests   Final    BOTTLES DRAWN AEROBIC AND ANAEROBIC AER 3ML ANA 2ML   Culture  Setup Time   Final  GRAM NEGATIVE RODS IN BOTH AEROBIC AND ANAEROBIC BOTTLES CRITICAL VALUE NOTED.  VALUE IS CONSISTENT WITH PREVIOUSLY REPORTED AND CALLED VALUE.    Culture   Final    PROTEUS MIRABILIS IN BOTH AEROBIC AND ANAEROBIC BOTTLES SUSCEPTIBILITIES TO FOLLOW    Report Status PENDING  Incomplete  Blood Culture ID Panel (Reflexed)     Status: Abnormal   Collection Time: 10/20/15  9:57 AM  Result Value Ref Range Status   Enterococcus species NOT DETECTED NOT DETECTED Final   Listeria monocytogenes NOT DETECTED NOT DETECTED Final   Staphylococcus species NOT DETECTED NOT DETECTED Final   Staphylococcus aureus NOT DETECTED NOT DETECTED Final   Streptococcus species NOT DETECTED NOT DETECTED Final   Streptococcus agalactiae NOT DETECTED NOT DETECTED Final   Streptococcus pneumoniae NOT DETECTED NOT DETECTED Final   Streptococcus pyogenes NOT DETECTED NOT DETECTED Final   Acinetobacter baumannii NOT DETECTED NOT DETECTED Final   Enterobacteriaceae species DETECTED (A) NOT DETECTED Final    Comment: CRITICAL RESULT CALLED TO, READ BACK BY AND VERIFIED WITH: MATT MCBANE ON 10/21/15 AT  0005 [place]    Enterobacter cloacae complex NOT DETECTED NOT DETECTED Final   Escherichia coli NOT DETECTED NOT DETECTED Final   Klebsiella oxytoca NOT DETECTED NOT DETECTED Final   Klebsiella pneumoniae NOT DETECTED NOT DETECTED Final   Proteus species (A) NOT DETECTED Final    CRITICAL RESULT CALLED TO, READ BACK BY AND VERIFIED WITH:    Comment: MATT MCBANE ON 10/21/15 AT 0005 [place]   Serratia marcescens NOT DETECTED NOT DETECTED Final   Haemophilus influenzae NOT DETECTED NOT DETECTED Final   Neisseria meningitidis NOT DETECTED NOT DETECTED Final   Pseudomonas aeruginosa NOT DETECTED NOT DETECTED Final   Candida albicans NOT DETECTED NOT DETECTED Final   Candida glabrata NOT DETECTED NOT DETECTED Final   Candida krusei NOT DETECTED NOT DETECTED Final   Candida parapsilosis NOT DETECTED NOT DETECTED Final   Candida tropicalis NOT DETECTED NOT DETECTED Final   Carbapenem resistance NOT DETECTED NOT DETECTED Final   Methicillin resistance NOT DETECTED NOT DETECTED Final   Vancomycin resistance NOT DETECTED NOT DETECTED Final  MRSA PCR Screening     Status: None   Collection Time: 10/20/15  7:41 PM  Result Value Ref Range Status   MRSA by PCR NEGATIVE NEGATIVE Final    Comment:        The GeneXpert MRSA Assay (FDA approved for NASAL specimens only), is one component of a comprehensive MRSA colonization surveillance program. It is not intended to diagnose MRSA infection nor to guide or monitor treatment for MRSA infections.     Medications:  Scheduled:  . antiseptic oral rinse  7 mL Mouth Rinse 6 times per day  . cefTRIAXone (ROCEPHIN)  IV  2 g Intravenous Once  . [START ON 10/22/2015] cefTRIAXone (ROCEPHIN)  IV  2 g Intravenous Q24H  . chlorhexidine gluconate  15 mL Mouth/Throat BID  . feeding supplement (PRO-STAT SUGAR FREE 64)  30 mL Per Tube TID  . free water  100 mL Per Tube 3 times per day  . hydrocortisone sod succinate (SOLU-CORTEF) inj  50 mg Intravenous Q6H  . insulin  aspart  0-20 Units Subcutaneous 6 times per day  . pantoprazole (PROTONIX) IV  40 mg Intravenous QHS   Infusions:  . amiodarone 60 mg/hr (10/21/15 1524)  . feeding supplement (VITAL AF 1.2 CAL)    . fentaNYL infusion INTRAVENOUS 300 mcg/hr (10/21/15 1524)  . norepinephrine (LEVOPHED) Adult infusion 47  mcg/min (10/21/15 1627)  . pureflow 3 each (10/21/15 1050)  .  sodium bicarbonate  infusion 1000 mL 100 mL/hr at 10/21/15 1019  . vasopressin (PITRESSIN) infusion - *FOR SHOCK* 0.03 Units/min (10/21/15 0600)    Assessment: Pharmacy consulted to adjust medications for 70 yo female ICU patient requiring mechanical ventilation and CRRT. Patient previously ordered heparin drip for NSTEMI, discontinued to due to thrombocytopenia.     Plan:  1. No further medications adjustments warranted for CRRT.   2. Patient initially ordered vancomycin and Zosyn, narrowed to ceftriaxone for proteus bacteremai/UTI. Will continue patient on ceftriaxone 2g IV Q24hr. Will follow cultures and adjust as appropriate.    Pharmacy will continue to monitor and adjust per consult.    Simpson,Michael L 10/21/2015,4:40 PM

## 2015-10-21 NOTE — Progress Notes (Signed)
ANTICOAGULATION CONSULT NOTE - Initial Consult  Pharmacy Consult for Heparin Indication: NSTEMI  Allergies  Allergen Reactions  . Prednisone Anaphylaxis    Patient Measurements: Height: '5\' 7"'$  (170.2 cm) Weight: 249 lb 1.9 oz (113 kg) IBW/kg (Calculated) : 61.6  Estimated height= 68 inches Heparin Dosing Weight: 90 kg  Vital Signs: Temp: 100.4 F (38 C) (01/25 0200) Temp Source: Core (Comment) (01/24 1800) BP: 93/47 mmHg (01/25 0200) Pulse Rate: 121 (01/25 0200)  Labs:  Recent Labs  10/20/15 0957 10/20/15 1010 10/20/15 2242 10/21/15 0023  HGB 16.4*  --  13.6  --   HCT 49.9*  --  43.1  --   PLT 62*  --  46*  --   APTT 35  --   --   --   LABPROT 17.8*  --   --  18.4*  INR 1.46  --   --  1.52  HEPARINUNFRC <0.10*  --   --  <0.10*  CREATININE 2.95*  3.01*  --   --   --   CKTOTAL  --  1562* 779*  --   TROPONINI 1.21*  --  1.08*  --     Estimated Creatinine Clearance: 22.9 mL/min (by C-G formula based on Cr of 3.01).   Medical History: No past medical history on file.  Medications:  Scheduled:  . antiseptic oral rinse  7 mL Mouth Rinse 6 times per day  . calcium gluconate  2 g Intravenous Once  . chlorhexidine gluconate  15 mL Mouth/Throat BID  . fentaNYL      . heparin  2,700 Units Intravenous Once  . heparin  4,000 Units Intravenous Once  . midazolam      . pantoprazole (PROTONIX) IV  40 mg Intravenous QHS  . piperacillin-tazobactam (ZOSYN)  IV  3.375 g Intravenous 3 times per day   Infusions:  . amiodarone    . fentaNYL infusion INTRAVENOUS 200 mcg/hr (10/21/15 0100)  . heparin 1,200 Units/hr (10/21/15 0100)  . midazolam (VERSED) infusion Stopped (10/20/15 1045)  . norepinephrine (LEVOPHED) Adult infusion 65 mcg/min (10/21/15 0204)  . pureflow 2,000 mL/hr at 10/20/15 2100  .  sodium bicarbonate  infusion 1000 mL 100 mL/hr at 10/21/15 0031  . vasopressin (PITRESSIN) infusion - *FOR SHOCK* 0.03 Units/min (10/21/15 0100)    Assessment: 70 y/o F  admitted after being found unresponsive. Patient with sepsis/NSTEMI. No PMH or PTA meds known at this time. INR is acceptable for bolus. Will go ahead and order baseline HL and APTT due to unkown medication history. Height is estimated. No family members available and patient is intubated.   Goal of Therapy:  Heparin level 0.3-0.7 units/ml Monitor platelets by anticoagulation protocol: Yes   Plan:  Give 4000 units bolus x 1 Start heparin infusion at 1200 units/hr Check anti-Xa level in 8 hours and daily while on heparin Continue to monitor H&H and platelets   1/24 PM heparin level <0.1. 2700 unit bolus and increase rate to 1550 units/hr. Recheck in 8 hours.  CBC ordered in AM.  Mays Paino S 10/21/2015,2:08 AM

## 2015-10-21 NOTE — Care Management (Signed)
Spoke with patient's uncle- April [name]- who lives in [place].  Patient does not have children or siblings and to his knowledge, she does not have a HCPOA.  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   Rock April [name] listed in contacts is Herbert's son- - patient's cousin.  Mr April [name]  does not know other contact   [name] that is listed.  Patient was found at home unresponsive after she did not show up for work for 2 days.  She is currently intubated and on mechanical ventilator.

## 2015-10-21 NOTE — Progress Notes (Signed)
Inpatient Diabetes Program Recommendations  AACE/ADA: New Consensus Statement on Inpatient Glycemic Control (2015)  Target Ranges:  Prepandial:   less than 140 mg/dL      Peak postprandial:   less than 180 mg/dL (1-2 hours)      Critically ill patients:  140 - 180 mg/dL  Results for April [name], April [name] (MRN [id number]) as of 10/21/2015 09:15  Ref. Range 10/20/2015 09:57 10/21/2015 05:15  Glucose Latest Ref Range: 65-99 mg/dL 185 (H) 263 (H)   Review of Glycemic Control  Diabetes history: No Outpatient Diabetes medications: NA Current orders for Inpatient glycemic control: None  Inpatient Diabetes Program Recommendations: Correction (SSI): Noted lab glucose of 263 mg/dl at 5:15 am on 10/21/15. Please consider ordering CBGs and Novolog correction scale Q4H. HgbA1C: Please consider adding an A1C to blood in lab to evaluate glycemic control over the past 2-3 months.  Thanks, Barnie Alderman, RN, MSN, CDE Diabetes Coordinator Inpatient Diabetes Program [phone] (Team Pager from [place] to [place]) [phone] ([facility] office) [phone] [facility] office) [phone] [facility] office)

## 2015-10-21 NOTE — Progress Notes (Signed)
Dr. Rockey Situ present and RN discussed with MD need for amio drip to remain at '60mg'$ /H. Patient remains in Afib with PVCs, rate fluctuates from 100's-130.  Dr. Rockey Situ gave verbal order to continue amio drip at 60 mg/H and to not titrate down.

## 2015-10-22 ENCOUNTER — Inpatient Hospital Stay: Payer: Medicare HMO

## 2015-10-22 DIAGNOSIS — B964 Proteus (mirabilis) (morganii) as the cause of diseases classified elsewhere: Secondary | ICD-10-CM

## 2015-10-22 LAB — CULTURE, BLOOD (ROUTINE X 2)

## 2015-10-22 LAB — COMPREHENSIVE METABOLIC PANEL
ALBUMIN: 1.8 g/dL — AB (ref 3.5–5.0)
ALT: 276 U/L — AB (ref 14–54)
AST: 428 U/L — AB (ref 15–41)
Alkaline Phosphatase: 209 U/L — ABNORMAL HIGH (ref 38–126)
Anion gap: 10 (ref 5–15)
BILIRUBIN TOTAL: 4.8 mg/dL — AB (ref 0.3–1.2)
BUN: 35 mg/dL — AB (ref 6–20)
CHLORIDE: 101 mmol/L (ref 101–111)
CO2: 26 mmol/L (ref 22–32)
CREATININE: 1.34 mg/dL — AB (ref 0.44–1.00)
Calcium: 7.7 mg/dL — ABNORMAL LOW (ref 8.9–10.3)
GFR calc Af Amer: 46 mL/min — ABNORMAL LOW (ref >60)
GFR, EST NON AFRICAN AMERICAN: 39 mL/min — AB (ref >60)
GLUCOSE: 364 mg/dL — AB (ref 65–99)
Potassium: 4 mmol/L (ref 3.5–5.1)
Sodium: 137 mmol/L (ref 135–145)
Total Protein: 4.9 g/dL — ABNORMAL LOW (ref 6.5–8.1)

## 2015-10-22 LAB — PAN-ANCA
C-ANCA: <1:20 {titer}
P-ANCA: <1:20 {titer}

## 2015-10-22 LAB — RENAL FUNCTION PANEL
ANION GAP: 11 (ref 5–15)
ANION GAP: 8 (ref 5–15)
ANION GAP: 13 (ref 5–15)
Albumin: 1.8 g/dL — ABNORMAL LOW (ref 3.5–5.0)
Albumin: 1.8 g/dL — ABNORMAL LOW (ref 3.5–5.0)
Albumin: 1.8 g/dL — ABNORMAL LOW (ref 3.5–5.0)
BUN: 34 mg/dL — AB (ref 6–20)
BUN: 41 mg/dL — AB (ref 6–20)
BUN: 49 mg/dL — ABNORMAL HIGH (ref 6–20)
CHLORIDE: 102 mmol/L (ref 101–111)
CHLORIDE: 102 mmol/L (ref 101–111)
CHLORIDE: 100 mmol/L — AB (ref 101–111)
CO2: 24 mmol/L (ref 22–32)
CO2: 27 mmol/L (ref 22–32)
CO2: 26 mmol/L (ref 22–32)
CREATININE: 2.06 mg/dL — AB (ref 0.44–1.00)
Calcium: 7.6 mg/dL — ABNORMAL LOW (ref 8.9–10.3)
Calcium: 7.9 mg/dL — ABNORMAL LOW (ref 8.9–10.3)
Calcium: 7.8 mg/dL — ABNORMAL LOW (ref 8.9–10.3)
Creatinine, Ser: 1.34 mg/dL — ABNORMAL HIGH (ref 0.44–1.00)
Creatinine, Ser: 1.72 mg/dL — ABNORMAL HIGH (ref 0.44–1.00)
GFR calc Af Amer: 46 mL/min — ABNORMAL LOW (ref >60)
GFR calc Af Amer: 34 mL/min — ABNORMAL LOW (ref >60)
GFR calc non Af Amer: 39 mL/min — ABNORMAL LOW (ref >60)
GFR calc non Af Amer: 29 mL/min — ABNORMAL LOW (ref >60)
GFR, EST AFRICAN AMERICAN: 27 mL/min — AB (ref >60)
GFR, EST NON AFRICAN AMERICAN: 23 mL/min — AB (ref >60)
GLUCOSE: 356 mg/dL — AB (ref 65–99)
GLUCOSE: 314 mg/dL — AB (ref 65–99)
Glucose, Bld: 199 mg/dL — ABNORMAL HIGH (ref 65–99)
PHOSPHORUS: 1.7 mg/dL — AB (ref 2.5–4.6)
POTASSIUM: 4 mmol/L (ref 3.5–5.1)
POTASSIUM: 3.9 mmol/L (ref 3.5–5.1)
POTASSIUM: 3.7 mmol/L (ref 3.5–5.1)
Phosphorus: 1.9 mg/dL — ABNORMAL LOW (ref 2.5–4.6)
Phosphorus: 2.8 mg/dL (ref 2.5–4.6)
Sodium: 137 mmol/L (ref 135–145)
Sodium: 137 mmol/L (ref 135–145)
Sodium: 139 mmol/L (ref 135–145)

## 2015-10-22 LAB — GLUCOSE, CAPILLARY
GLUCOSE-CAPILLARY: 307 mg/dL — AB (ref 65–99)
GLUCOSE-CAPILLARY: 264 mg/dL — AB (ref 65–99)
GLUCOSE-CAPILLARY: 214 mg/dL — AB (ref 65–99)
GLUCOSE-CAPILLARY: 185 mg/dL — AB (ref 65–99)
GLUCOSE-CAPILLARY: 143 mg/dL — AB (ref 65–99)
GLUCOSE-CAPILLARY: 154 mg/dL — AB (ref 65–99)
Glucose-Capillary: 296 mg/dL — ABNORMAL HIGH (ref 65–99)
Glucose-Capillary: 301 mg/dL — ABNORMAL HIGH (ref 65–99)
Glucose-Capillary: 270 mg/dL — ABNORMAL HIGH (ref 65–99)
Glucose-Capillary: 225 mg/dL — ABNORMAL HIGH (ref 65–99)
Glucose-Capillary: 258 mg/dL — ABNORMAL HIGH (ref 65–99)
Glucose-Capillary: 169 mg/dL — ABNORMAL HIGH (ref 65–99)
Glucose-Capillary: 189 mg/dL — ABNORMAL HIGH (ref 65–99)
Glucose-Capillary: 123 mg/dL — ABNORMAL HIGH (ref 65–99)
Glucose-Capillary: 132 mg/dL — ABNORMAL HIGH (ref 65–99)
Glucose-Capillary: 105 mg/dL — ABNORMAL HIGH (ref 65–99)

## 2015-10-22 LAB — CBC
HCT: 37 % (ref 35.0–47.0)
HEMATOCRIT: 36.8 % (ref 35.0–47.0)
HEMOGLOBIN: 12.3 g/dL (ref 12.0–16.0)
Hemoglobin: 12.1 g/dL (ref 12.0–16.0)
MCH: 29.4 pg (ref 26.0–34.0)
MCH: 29.8 pg (ref 26.0–34.0)
MCHC: 32.7 g/dL (ref 32.0–36.0)
MCHC: 33.3 g/dL (ref 32.0–36.0)
MCV: 89.9 fL (ref 80.0–100.0)
MCV: 89.3 fL (ref 80.0–100.0)
PLATELETS: 12 10*3/uL — AB (ref 150–440)
Platelets: 14 10*3/uL — CL (ref 150–440)
RBC: 4.12 MIL/uL (ref 3.80–5.20)
RBC: 4.13 MIL/uL (ref 3.80–5.20)
RDW: 14.7 % — AB (ref 11.5–14.5)
RDW: 14.4 % (ref 11.5–14.5)
WBC: 19.7 10*3/uL — AB (ref 3.6–11.0)
WBC: 18.8 10*3/uL — ABNORMAL HIGH (ref 3.6–11.0)

## 2015-10-22 LAB — MAGNESIUM
MAGNESIUM: 1.6 mg/dL — AB (ref 1.7–2.4)
MAGNESIUM: 1.6 mg/dL — AB (ref 1.7–2.4)

## 2015-10-22 LAB — TYPE AND SCREEN
ABO/RH(D): A POS
ANTIBODY SCREEN: NEGATIVE

## 2015-10-22 LAB — GLOMERULAR BASEMENT MEMBRANE ANTIBODIES: GBM Ab: 3 units (ref 0–20)

## 2015-10-22 LAB — HEMOGLOBIN A1C: HEMOGLOBIN A1C: 7.7 % — AB (ref 4.0–6.0)

## 2015-10-22 LAB — C3 COMPLEMENT: C3 COMPLEMENT: 94 mg/dL (ref 82–167)

## 2015-10-22 LAB — URINE CULTURE

## 2015-10-22 LAB — EXPECTORATED SPUTUM ASSESSMENT W REFEX TO RESP CULTURE: SPECIAL REQUESTS: NORMAL

## 2015-10-22 LAB — HEPATITIS B SURFACE ANTIBODY,QUALITATIVE: Hep B S Ab: NONREACTIVE

## 2015-10-22 LAB — LIPID PANEL
CHOL/HDL RATIO: UNDETERMINED ratio
Cholesterol: 108 mg/dL (ref 0–200)
LDL CALC: UNDETERMINED mg/dL (ref 0–99)
Triglycerides: 458 mg/dL — ABNORMAL HIGH (ref <150)
VLDL: UNDETERMINED mg/dL (ref 0–40)

## 2015-10-22 LAB — HEPARIN INDUCED PLATELET AB (HIT ANTIBODY): HEPARIN INDUCED PLT AB: 0.744 {OD_unit} — AB (ref 0.000–0.400)

## 2015-10-22 LAB — C4 COMPLEMENT: COMPLEMENT C4, BODY FLUID: 31 mg/dL (ref 14–44)

## 2015-10-22 LAB — ANA W/REFLEX: Anti Nuclear Antibody(ANA): NEGATIVE

## 2015-10-22 LAB — HEPATITIS C ANTIBODY: HCV Ab: <0.1 s/co ratio (ref 0.0–0.9)

## 2015-10-22 LAB — TROPONIN I: Troponin I: 0.27 ng/mL — ABNORMAL HIGH (ref <0.031)

## 2015-10-22 LAB — HEPATITIS B CORE ANTIBODY, IGM: HEP B C IGM: NEGATIVE

## 2015-10-22 LAB — ABO/RH: ABO/RH(D): A POS

## 2015-10-22 LAB — HIV ANTIBODY (ROUTINE TESTING W REFLEX): HIV Screen 4th Generation wRfx: NONREACTIVE

## 2015-10-22 LAB — EXPECTORATED SPUTUM ASSESSMENT W GRAM STAIN, RFLX TO RESP C

## 2015-10-22 LAB — CK: CK TOTAL: 162 U/L (ref 38–234)

## 2015-10-22 MED ORDER — ANTICOAGULANT SODIUM CITRATE 4% (200MG/5ML) IV SOLN
5.0000 mL | Freq: Once | Status: AC
  Administered 2015-10-22: 1.3 mL via INTRAVENOUS
  Filled 2015-10-22: qty 250

## 2015-10-22 MED ORDER — SODIUM CHLORIDE 0.9 % IV SOLN
INTRAVENOUS | Status: DC
  Administered 2015-10-22: 2.5 [IU]/h via INTRAVENOUS
  Filled 2015-10-22 (×2): qty 2.5

## 2015-10-22 MED ORDER — SODIUM PHOSPHATE 3 MMOLE/ML IV SOLN
30.0000 mmol | Freq: Once | INTRAVENOUS | Status: AC
  Administered 2015-10-22: 30 mmol via INTRAVENOUS
  Filled 2015-10-22: qty 10

## 2015-10-22 MED ORDER — VITAL AF 1.2 CAL PO LIQD
1000.0000 mL | ORAL | Status: DC
  Administered 2015-10-22 – 2015-10-23 (×4): 1000 mL

## 2015-10-22 MED ORDER — MAGNESIUM SULFATE 2 GM/50ML IV SOLN
2.0000 g | Freq: Once | INTRAVENOUS | Status: AC
  Administered 2015-10-22: 2 g via INTRAVENOUS
  Filled 2015-10-22: qty 50

## 2015-10-22 MED ORDER — SENNOSIDES-DOCUSATE SODIUM 8.6-50 MG PO TABS
1.0000 | ORAL_TABLET | Freq: Two times a day (BID) | ORAL | Status: DC
  Administered 2015-10-22 – 2015-10-23 (×3): 1 via ORAL
  Filled 2015-10-22 (×3): qty 1

## 2015-10-22 NOTE — Progress Notes (Signed)
[facility]  ROUNDING NOTE   Subjective:   CRRT. UF 483, UOP 115 net +2.2litres . BFR 250. Wbc improved.  Carbon dioxide 35  Norepinephrine and vasopressin gtt.   Blood cultures with proteus and enterobacter species - on ceftriaxone  Objective:  Vital signs in last 24 hours:  Temp:  [98.2 F (36.8 C)-100.9 F (38.3 C)] 100.9 F (38.3 C) (01/26 0700) Pulse Rate:  [28-120] 96 (01/26 0900) Resp:  [18-31] 30 (01/26 0900) BP: (87-128)/(39-108) 98/70 mmHg (01/26 0900) SpO2:  [82 %-100 %] 94 % (01/26 0900) FiO2 (%):  [30 %-100 %] 30 % (01/26 0852) Weight:  [122.3 kg (269 lb 10 oz)] 122.3 kg (269 lb 10 oz) (01/26 0411)  Weight change: 8.991 kg (19 lb 13.2 oz) Filed Weights   10/20/15 1700 10/21/15 0500 10/22/15 0411  Weight: 113 kg (249 lb 1.9 oz) 122.9 kg (270 lb 15.1 oz) 122.3 kg (269 lb 10 oz)    Intake/Output: I/O last 3 completed shifts: In: 4863.8 [I.V.:4748.8; Other:15; NG/GT:50; IV Piggyback:50] Out: 914 [Urine:460; Other:483]   Intake/Output this shift:     Physical Exam: General: Critically ill, intubated, sedated  Head: +ETT, +OGT  Eyes: Eyes closed, reactive to light, +icterus  Neck: RIJ temp HD catheter, L IJ central line 1/25 Dr. Lucky Cowboy  Lungs:  PRVC FiO2 30%  Heart: Irregular rhythm, tachycardia  Abdomen:  Soft, nontender, obese  Extremities: 2+ peripheral edema. +anasarca  Neurologic: Sedated, intubated  Skin: mottling  Access: RIJ vascath 10/20/15 Dr. Stevenson Clinch    Basic Metabolic Panel:  Recent Labs Lab 10/20/15 0957 10/21/15 0515 10/21/15 1755 10/22/15 0503  NA 140 135 136 137  137  K 3.9 3.9 4.2 4.0  4.0  CL 101 103 101 101  102  CO2 20* 18* '23 26  24  '$ GLUCOSE 185* 263* 317* 364*  356*  BUN 66* 45* 34* 35*  34*  CREATININE 2.95*  3.01* 1.87* 1.59* 1.34*  1.34*  CALCIUM 9.0 7.3* 7.6* 7.7*  7.6*  MG  --  1.5* 1.6* 1.6*  PHOS  --  3.1 2.3* 1.9*    Liver Function Tests:  Recent Labs Lab 10/20/15 0957 10/21/15 0515  10/21/15 1755 10/22/15 0503  AST 124* 312*  --  428*  ALT 63* 176*  --  276*  ALKPHOS 567* 391*  --  209*  BILITOT 2.5* 3.1*  --  4.8*  PROT 7.4 5.3*  --  4.9*  ALBUMIN 3.1* 2.1* 1.9* 1.8*  1.8*    Recent Labs Lab 10/20/15 0957  LIPASE 11    Recent Labs Lab 10/20/15 0957  AMMONIA 34    CBC:  Recent Labs Lab 10/20/15 0957 10/20/15 2242 10/21/15 0515 10/22/15 0503  WBC 50.5* 47.1* 38.8* 19.7*  NEUTROABS 47.0*  --   --   --   HGB 16.4* 13.6 13.3 12.1  HCT 49.9* 43.1 40.2 37.0  MCV 90.2 95.0 90.8 89.9  PLT 62* 46* 23* 12*    Cardiac Enzymes:  Recent Labs Lab 10/20/15 0957 10/20/15 1010 10/20/15 2242 10/21/15 1755 10/22/15 0106 10/22/15 0503  CKTOTAL  --  1562* 779*  --   --  162  TROPONINI 1.21*  --  1.08* 0.44* 0.27*  --     BNP: Invalid input(s): POCBNP  CBG:  Recent Labs Lab 10/21/15 1706 10/21/15 1959 10/21/15 2342 10/22/15 0345 10/22/15 0722  GLUCAP 233* 271* 317* 296* 301*    Microbiology: Results for orders placed or performed during the hospital encounter of 10/20/15  Urine culture     Status: None (Preliminary result)   Collection Time: 10/20/15  9:57 AM  Result Value Ref Range Status   Specimen Description URINE, RANDOM  Final   Special Requests NONE  Final   Culture   Final    >=100,000 COLONIES/mL PROTEUS MIRABILIS SUSCEPTIBILITIES TO FOLLOW    Report Status PENDING  Incomplete  Culture, blood (routine x 2)     Status: None   Collection Time: 10/20/15  9:57 AM  Result Value Ref Range Status   Specimen Description BLOOD RIGHT WRIST  Final   Special Requests   Final    BOTTLES DRAWN AEROBIC AND ANAEROBIC ANA 1ML AER 4ML   Culture  Setup Time   Final    GRAM NEGATIVE RODS IN BOTH AEROBIC AND ANAEROBIC BOTTLES CRITICAL RESULT CALLED TO, READ BACK BY AND VERIFIED WITH: MATT [place] ON 10/21/15 AT 0005 [facility] CONFIRMED BY [place]    Culture   Final    PROTEUS MIRABILIS IN BOTH AEROBIC AND ANAEROBIC BOTTLES    Report Status  10/22/2015 FINAL  Final   Organism ID, Bacteria PROTEUS MIRABILIS  Final      Susceptibility   Proteus mirabilis - MIC*    AMPICILLIN/SULBACTAM Value in next row Sensitive      SENSITIVE<=2    PIP/TAZO Value in next row Sensitive      SENSITIVE<=4    CEFTRIAXONE Value in next row Sensitive      SENSITIVE<=1    IMIPENEM Value in next row Sensitive      SENSITIVE2    GENTAMICIN Value in next row Sensitive      SENSITIVE<=1    CIPROFLOXACIN Value in next row Sensitive      SENSITIVE<=0.25    * PROTEUS MIRABILIS  Culture, blood (routine x 2)     Status: None   Collection Time: 10/20/15  9:57 AM  Result Value Ref Range Status   Specimen Description BLOOD LEFT HAND  Final   Special Requests   Final    BOTTLES DRAWN AEROBIC AND ANAEROBIC AER 3ML ANA 2ML   Culture  Setup Time   Final    GRAM NEGATIVE RODS IN BOTH AEROBIC AND ANAEROBIC BOTTLES CRITICAL VALUE NOTED.  VALUE IS CONSISTENT WITH PREVIOUSLY REPORTED AND CALLED VALUE.    Culture   Final    PROTEUS MIRABILIS IN BOTH AEROBIC AND ANAEROBIC BOTTLES    Report Status 10/22/2015 FINAL  Final   Organism ID, Bacteria PROTEUS MIRABILIS  Final      Susceptibility   Proteus mirabilis - MIC*    AMPICILLIN Value in next row Sensitive      SENSITIVE<=2    PIP/TAZO Value in next row Sensitive      SENSITIVE<=4    CEFTAZIDIME Value in next row Sensitive      SENSITIVE<=1    CEFTRIAXONE Value in next row Sensitive      SENSITIVE<=1    CEFEPIME Value in next row Sensitive      SENSITIVE<=1    IMIPENEM Value in next row Sensitive      SENSITIVE2    GENTAMICIN Value in next row Sensitive      SENSITIVE<=1    CIPROFLOXACIN Value in next row Sensitive      SENSITIVE<=0.25    * PROTEUS MIRABILIS  Blood Culture ID Panel (Reflexed)     Status: Abnormal   Collection Time: 10/20/15  9:57 AM  Result Value Ref Range Status   Enterococcus species NOT DETECTED NOT DETECTED Final  Listeria monocytogenes NOT DETECTED NOT DETECTED Final    Staphylococcus species NOT DETECTED NOT DETECTED Final   Staphylococcus aureus NOT DETECTED NOT DETECTED Final   Streptococcus species NOT DETECTED NOT DETECTED Final   Streptococcus agalactiae NOT DETECTED NOT DETECTED Final   Streptococcus pneumoniae NOT DETECTED NOT DETECTED Final   Streptococcus pyogenes NOT DETECTED NOT DETECTED Final   Acinetobacter baumannii NOT DETECTED NOT DETECTED Final   Enterobacteriaceae species DETECTED (A) NOT DETECTED Final    Comment: CRITICAL RESULT CALLED TO, READ BACK BY AND VERIFIED WITH: MATT MCBANE ON 10/21/15 AT 0005 [place]    Enterobacter cloacae complex NOT DETECTED NOT DETECTED Final   Escherichia coli NOT DETECTED NOT DETECTED Final   Klebsiella oxytoca NOT DETECTED NOT DETECTED Final   Klebsiella pneumoniae NOT DETECTED NOT DETECTED Final   Proteus species (A) NOT DETECTED Final    CRITICAL RESULT CALLED TO, READ BACK BY AND VERIFIED WITH:    Comment: MATT MCBANE ON 10/21/15 AT 0005 [place]   Serratia marcescens NOT DETECTED NOT DETECTED Final   Haemophilus influenzae NOT DETECTED NOT DETECTED Final   Neisseria meningitidis NOT DETECTED NOT DETECTED Final   Pseudomonas aeruginosa NOT DETECTED NOT DETECTED Final   Candida albicans NOT DETECTED NOT DETECTED Final   Candida glabrata NOT DETECTED NOT DETECTED Final   Candida krusei NOT DETECTED NOT DETECTED Final   Candida parapsilosis NOT DETECTED NOT DETECTED Final   Candida tropicalis NOT DETECTED NOT DETECTED Final   Carbapenem resistance NOT DETECTED NOT DETECTED Final   Methicillin resistance NOT DETECTED NOT DETECTED Final   Vancomycin resistance NOT DETECTED NOT DETECTED Final  MRSA PCR Screening     Status: None   Collection Time: 10/20/15  7:41 PM  Result Value Ref Range Status   MRSA by PCR NEGATIVE NEGATIVE Final    Comment:        The GeneXpert MRSA Assay (FDA approved for NASAL specimens only), is one component of a comprehensive MRSA colonization surveillance program. It is  not intended to diagnose MRSA infection nor to guide or monitor treatment for MRSA infections.     Coagulation Studies:  Recent Labs  10/20/15 0957 10/21/15 0023  LABPROT 17.8* 18.4*  INR 1.46 1.52    Urinalysis:  Recent Labs  10/20/15 0957  COLORURINE RED*  LABSPEC 1.017  PHURINE 6.0  GLUCOSEU NEGATIVE  HGBUR 3+*  BILIRUBINUR NEGATIVE  KETONESUR TRACE*  PROTEINUR 100*  NITRITE NEGATIVE  LEUKOCYTESUR 2+*      Imaging: Ct Abdomen Pelvis Wo Contrast  10/20/2015  CLINICAL DATA:  Patient had not been hurt from for couple days, was found by EMS between the wall and the bed, responsive to pain, leukocytosis, elevated glucose and creatinine, abdominal swelling, pelvic pain EXAM: CT ABDOMEN AND PELVIS WITHOUT CONTRAST TECHNIQUE: Multidetector CT imaging of the abdomen and pelvis was performed following the standard protocol without IV contrast. Sagittal and coronal MPR images reconstructed from axial data set. COMPARISON:  None FINDINGS: Bibasilar atelectasis. Calcifications at LEFT lung base may represent calcified granuloma within atelectatic lower lobe. Marked RIGHT hydronephrosis without definite ureteral calcification or dilatation. Edema surrounding the RIGHT kidney can be related to obstruction. Small nonobstructing calculus 4 mm diameter RIGHT kidney image 51. Beam hardening artifacts from patient's arms traverse upper abdomen. Within limits of noncontrast technique and beam hardening artifacts, no definite additional abnormalities of the liver, spleen, atrophic pancreas, or kidneys. BILATERAL thickened adrenal glands without discrete mass. Gallbladder distended without mass or surrounding inflammatory changes. Extensive colonic  diverticulosis without evidence of diverticulitis. Stomach and remaining bowel loops normal appearance. Small umbilical hernia containing fat. Normal appendix. Unremarkable uterus and RIGHT adnexa. LEFT ovary prominent size for age at 3.8 x 3.6 x 3.6 cm.  Bladder decompressed by Foley catheter. Shotty retroperitoneal nodes without adenopathy. No mass, adenopathy, free air or free fluid. Scattered atherosclerotic calcifications. Bones demineralized. IMPRESSION: RIGHT hydronephrosis question UPJ obstruction; in the setting of leukocytosis, recommend correlation with urinalysis to exclude urinary tract infection. Scattered colonic diverticulosis. Prominent LEFT ovarian size for age ; non emergent followup pelvic and transvaginal sonography recommended to characterize. Electronically Signed   By: Lavonia Dana M.D.   On: 10/20/2015 12:50   Dg Chest 1 View  10/20/2015  CLINICAL DATA:  Patient found down and minimally responsive 10/20/2015. Status post central line placement. EXAM: CHEST 1 VIEW COMPARISON:  Single view of the chest earlier today. FINDINGS: A new right IJ catheter is in place with the tip projecting over the lower superior vena cava. There is no pneumothorax. Endotracheal tube is again seen. Defibrillator pad is in place. There is no pneumothorax. Small left effusion and basilar atelectasis are identified. Mild interstitial edema. IMPRESSION: Right IJ catheter tip projects over the lower superior vena cava. Negative for pneumothorax. Mild interstitial edema with a small left effusion and basilar atelectasis. Electronically Signed   By: Inge Rise M.D.   On: 10/20/2015 18:46   Dg Abd 1 View  10/21/2015  CLINICAL DATA:  Orogastric tube placement. EXAM: ABDOMEN - 1 VIEW COMPARISON:  CT 10/20/2015. FINDINGS: Orogastric tube noted projected over the distal stomach. No bowel distention. Degenerative changes lumbar spine. IMPRESSION: Orogastric tube noted with its tip the left projected over the distal stomach . No bowel distention. Electronically Signed   By: [place]   On: 10/21/2015 07:21   Ct Head Wo Contrast  10/20/2015  CLINICAL DATA:  Found unresponsive today. EXAM: CT HEAD WITHOUT CONTRAST CT CERVICAL SPINE WITHOUT CONTRAST TECHNIQUE:  Multidetector CT imaging of the head and cervical spine was performed following the standard protocol without intravenous contrast. Multiplanar CT image reconstructions of the cervical spine were also generated. COMPARISON:  None. FINDINGS: CT HEAD FINDINGS Normal appearing cerebral hemispheres and posterior fossa structures. Normal size and position of the ventricles. No intracranial hemorrhage, mass lesion or CT evidence of acute infarction. Unremarkable bones. Mild mucosal thickening involving all of the paranasal sinuses with a small amount of fluid in the sphenoid sinus bilaterally. CT CERVICAL SPINE FINDINGS An endotracheal tube is in place. Multilevel degenerative changes. Straightening of the normal cervical lordosis. No prevertebral soft tissue swelling, fractures or subluxations. Bilateral carotid artery calcifications. IMPRESSION: 1. No acute intracranial abnormality. 2. No cervical spine fracture or subluxation. 3. Mild chronic pansinusitis with an acute component in the sphenoid sinuses. 4. Cervical spine degenerative changes. 5. Bilateral carotid artery atheromatous calcifications. Electronically Signed   By: Claudie Revering M.D.   On: 10/20/2015 13:25   Ct Cervical Spine Wo Contrast  10/20/2015  CLINICAL DATA:  Found unresponsive today. EXAM: CT HEAD WITHOUT CONTRAST CT CERVICAL SPINE WITHOUT CONTRAST TECHNIQUE: Multidetector CT imaging of the head and cervical spine was performed following the standard protocol without intravenous contrast. Multiplanar CT image reconstructions of the cervical spine were also generated. COMPARISON:  None. FINDINGS: CT HEAD FINDINGS Normal appearing cerebral hemispheres and posterior fossa structures. Normal size and position of the ventricles. No intracranial hemorrhage, mass lesion or CT evidence of acute infarction. Unremarkable bones. Mild mucosal thickening involving all of  the paranasal sinuses with a small amount of fluid in the sphenoid sinus bilaterally. CT  CERVICAL SPINE FINDINGS An endotracheal tube is in place. Multilevel degenerative changes. Straightening of the normal cervical lordosis. No prevertebral soft tissue swelling, fractures or subluxations. Bilateral carotid artery calcifications. IMPRESSION: 1. No acute intracranial abnormality. 2. No cervical spine fracture or subluxation. 3. Mild chronic pansinusitis with an acute component in the sphenoid sinuses. 4. Cervical spine degenerative changes. 5. Bilateral carotid artery atheromatous calcifications. Electronically Signed   By: Claudie Revering M.D.   On: 10/20/2015 13:25   US Renal  10/21/2015  CLINICAL DATA:  Acute renal failure. EXAM: RENAL / URINARY TRACT ULTRASOUND COMPLETE COMPARISON:  CT 10/20/2015. FINDINGS: Right Kidney: Length: 12.3 cm. Echogenicity within normal limits. No mass. Mild right hydronephrosis. Left Kidney: Length: 13.0 cm. Echogenicity within normal limits. No mass or hydronephrosis visualized. Bladder: Foley catheter in bladder. Bladder decompressed. Limited exam due to body habitus . IMPRESSION: 1.  Mild right hydronephrosis.  Similar finding noted on prior CT. 2. Foley catheter in bladder.  Bladder decompressed. Electronically Signed   By: Marcello Moores  Register   On: 10/21/2015 10:09   Dg Chest Port 1 View  10/21/2015  CLINICAL DATA:  Acute respiratory failure. On ventilator. Acute renal failure. Non ST elevated myocardial infarct. Sepsis. EXAM: PORTABLE CHEST 1 VIEW COMPARISON:  10/20/2015 FINDINGS: New left internal jugular central venous catheter is seen with tip overlying the superior cavoatrial junction. No pneumothorax visualized. Right jugular central venous catheter remains in appropriate position as well as endotracheal tube and nasogastric tube. Patient is partially rotated to the right. Mild cardiomegaly and probable mild interstitial edema shows no significant change. Bibasilar atelectasis and probable small left pleural effusion are also stable. IMPRESSION: New left  internal jugular central venous catheter in appropriate position. No pneumothorax visualized. No significant change in cardiomegaly, probable interstitial edema, bibasilar atelectasis, and probable small left pleural effusion. Electronically Signed   By: Earle Gell M.D.   On: 10/21/2015 16:48   Dg Chest Port 1 View  10/20/2015  CLINICAL DATA:  Hypoxia. EXAM: PORTABLE CHEST 1 VIEW COMPARISON:  None. FINDINGS: Endotracheal tube tip is 4.5 cm above the carina. No pneumothorax. There is no edema or consolidation. Heart size and pulmonary vascularity within normal limits. No adenopathy. No bone lesions. IMPRESSION: Endotracheal tube as described without pneumothorax. No edema or consolidation. Electronically Signed   By: Lowella Grip III M.D.   On: 10/20/2015 10:53     Medications:   . amiodarone 60 mg/hr (10/22/15 0600)  . feeding supplement (VITAL AF 1.2 CAL) 1,000 mL (10/22/15 0600)  . fentaNYL infusion INTRAVENOUS 250 mcg/hr (10/22/15 0814)  . insulin (NOVOLIN-R) infusion    . norepinephrine (LEVOPHED) Adult infusion 20 mcg/min (10/22/15 0813)  . pureflow 2,000 mL/hr at 10/22/15 0104  . vasopressin (PITRESSIN) infusion - *FOR SHOCK* 0.03 Units/min (10/22/15 0600)   . antiseptic oral rinse  7 mL Mouth Rinse 6 times per day  . cefTRIAXone (ROCEPHIN)  IV  2 g Intravenous Q24H  . chlorhexidine gluconate  15 mL Mouth/Throat BID  . feeding supplement (PRO-STAT SUGAR FREE 64)  30 mL Per Tube TID  . free water  100 mL Per Tube 3 times per day  . hydrocortisone sod succinate (SOLU-CORTEF) inj  50 mg Intravenous Q6H  . pantoprazole (PROTONIX) IV  40 mg Intravenous QHS   acetaminophen **OR** acetaminophen, heparin, midazolam, ondansetron **OR** ondansetron (ZOFRAN) IV  Assessment/ Plan:  [name] is a 70  y.o. white female with unknown past medications history, who was admitted to [facility] on 10/20/2015  1. Acute renal failure with metabolic acidosis: oliguric with hematuria, proteinuria  and pyuria. Hemodynamically unstable on vasopressors: norepinephrine and vasopressin. Unknown baseline creatinine.  CT with right sided perinephric edema.  Foley placed. Ultrasound showed improvement Acute renal failure from sepsis and rhabdomyolysis.  - continue CRRT 4K bath DFR 2K BFR 350. Increase UF to 50/hr - monitor volume status, urine output, electrolytes and renal function - serologic testing : HIV, hepatitis panel, ANA, ANCA, anti-GBM and serum complements.  - discontinue sodium bicarbonate  2. Sepsis/hypotension: wbc improving but still critically high. Febrile.  Blood cultures with proteus and enterobacter species. On ceftriaxone.  - Norepinephrine and vasopressin  3. Atrial fibrillation: new onset  - amiodarone gtt.  - echocardiogram pending   LOS: [place], [place] 1/26/20179:41 AM

## 2015-10-22 NOTE — Progress Notes (Signed)
Nutrition Follow-up      INTERVENTION:  EN: Recommend increasing vital 1.2 to 40m/hr at this time, reassess possible titration to goal rate of 535mhr in am pending tolerance   NUTRITION DIAGNOSIS:   Inadequate oral intake related to acute illness as evidenced by NPO status.    GOAL:   Provide needs based on ASPEN/SCCM guidelines    MONITOR:    (Energy Intake, Pulmonary, Digestive System, Electrolyte/Renal Profile, Glucose Profile)  REASON FOR ASSESSMENT:   Ventilator    ASSESSMENT:      Pt remains on vent, CRRT planning to hold as platelets dropping.     Current Nutrition: tolerating vital 1.2 at 2012mr    Gastrointestinal Profile: abdomin slightly distended per RN, SanKatharine Lookt soft, no BM, no vomiting Last BM: none   Scheduled Medications:  . antiseptic oral rinse  7 mL Mouth Rinse 6 times per day  . cefTRIAXone (ROCEPHIN)  IV  2 g Intravenous Q24H  . chlorhexidine gluconate  15 mL Mouth/Throat BID  . feeding supplement (PRO-STAT SUGAR FREE 64)  30 mL Per Tube TID  . free water  100 mL Per Tube 3 times per day  . hydrocortisone sod succinate (SOLU-CORTEF) inj  50 mg Intravenous Q6H  . pantoprazole (PROTONIX) IV  40 mg Intravenous QHS  . senna-docusate  1 tablet Oral BID    Continuous Medications:  . amiodarone 60 mg/hr (10/22/15 1243)  . feeding supplement (VITAL AF 1.2 CAL) 1,000 mL (10/22/15 0600)  . fentaNYL infusion INTRAVENOUS 150 mcg/hr (10/22/15 1236)  . insulin (NOVOLIN-R) infusion 2.5 Units/hr (10/22/15 1000)  . norepinephrine (LEVOPHED) Adult infusion 20 mcg/min (10/22/15 1236)  . pureflow 2,000 mL/hr at 10/22/15 0104  . vasopressin (PITRESSIN) infusion - *FOR SHOCK* 0.03 Units/min (10/22/15 1236)     Electrolyte/Renal Profile and Glucose Profile:   Recent Labs Lab 10/21/15 0515 10/21/15 1755 10/22/15 0503  NA 135 136 137  137  K 3.9 4.2 4.0  4.0  CL 103 101 101  102  CO2 18* '23 26  24  '$ BUN 45* 34* 35*  34*  CREATININE  1.87* 1.59* 1.34*  1.34*  CALCIUM 7.3* 7.6* 7.7*  7.6*  MG 1.5* 1.6* 1.6*  PHOS 3.1 2.3* 1.9*  GLUCOSE 263* 317* 364*  356*   Protein Profile:   Recent Labs Lab 10/21/15 0515 10/21/15 1755 10/22/15 0503  ALBUMIN 2.1* 1.9* 1.8*  1.8*     Nutrition-Focused Physical Exam Findings:  Unable to complete Nutrition-Focused physical exam at this time.     Weight Trend since Admission: Filed Weights   10/20/15 1700 10/21/15 0500 10/22/15 0411  Weight: 249 lb 1.9 oz (113 kg) 270 lb 15.1 oz (122.9 kg) 269 lb 10 oz (122.3 kg)      Diet Order:  Diet NPO time specified  Skin:  Reviewed, no issues  Height:   Ht Readings from Last 1 Encounters:  10/20/15 '5\' 7"'$  (1.702 m)    Weight:   Wt Readings from Last 1 Encounters:  10/22/15 269 lb 10 oz (122.3 kg)    Ideal Body Weight:     BMI:  Body mass index is 42.22 kg/(m^2).  Estimated Nutritional Needs:   Kcal:  [id number] (11-14 kcals/kg) using wt of 122.9 kg  Protein:  122-153 g (2.0-2.5 g/kg) using IBW 61 kg  Fluid:  1525-1830 mL (25-30 ml/kg)   EDUCATION NEEDS:   No education needs identified at this time  HIGH Care Level  Orpheus [place] B. AllZenia ResidesD,Mount CarmelDNPrairie Viewager) Weekend/On-Call  pager [phone])

## 2015-10-22 NOTE — Progress Notes (Signed)
PULMONARY / CRITICAL CARE MEDICINE   Name: April [name] MRN: [id number] DOB: 14-Dec-1945    ADMISSION DATE:  10/20/2015  Consulting physician - Dr. Fritzi Mandes  BRIEF HISTORY: 70 y.o. female with unknown past medical history was picked up by EMS today after being found at her house laying on the floor minimally responsive. Per coworkers she has not been to work for 2 days and when they checked on her they found her lying in the floor minimally responsive. EMS was called. Upon arrival to the ED, she only withdrew to pain, due to low GCS and inability to protect her airway she was intubated.   SUBJECTIVE:  LIJ CVL placed by Vascular on 1/25, now to have low plt this AM, crrt on hold, made 80cc of urine overnight. Continued to spike fevers on antibiotics   STUDIES:  1/24 CT head and cspine - no acute findings 1/25 CT A/P - RIGHT hydronephrosis question UPJ obstruction; in the setting of leukocytosis, recommend correlation with urinalysis to exclude urinary tract infection.  SIGNIFICANT EVENTS: 1/24>>found down, brought to [facility] ED, intubated and started on CRRT 1/24>>RIJ Vascath 1/25>>LIJ CVL, placed by Vascular (Dr. Lucky Cowboy).   VITAL SIGNS: Temp:  [98.2 F (36.8 C)-101.3 F (38.5 C)] 101.3 F (38.5 C) (01/26 1100) Pulse Rate:  [28-120] 74 (01/26 1100) Resp:  [18-31] 30 (01/26 1100) BP: (87-128)/(39-108) 98/70 mmHg (01/26 1100) SpO2:  [82 %-100 %] 100 % (01/26 1100) FiO2 (%):  [30 %-100 %] 30 % (01/26 0852) Weight:  [269 lb 10 oz (122.3 kg)] 269 lb 10 oz (122.3 kg) (01/26 0411) HEMODYNAMICS:   VENTILATOR SETTINGS: Vent Mode:  [-] PRVC FiO2 (%):  [30 %-100 %] 30 % Set Rate:  [30 bmp] 30 bmp Vt Set:  [500 mL] 500 mL PEEP:  [5 cmH20] 5 cmH20 Plateau Pressure:  [22 cmH20] 22 cmH20 INTAKE / OUTPUT:  Intake/Output Summary (Last 24 hours) at 10/22/15 1131 Last data filed at 10/22/15 1000  Gross per 24 hour  Intake 1878.6 ml  Output    660 ml  Net 1218.6 ml    Review of Systems   Unable to perform ROS: intubated    Physical Exam  Constitutional: She appears distressed.  HENT:  Head: Normocephalic and atraumatic.  Right Ear: External ear normal.  Left Ear: External ear normal.  Eyes: EOM are normal. Pupils are equal, round, and reactive to light.  Neck: Normal range of motion. No JVD present. No tracheal deviation present. No thyromegaly present.  Cardiovascular: Normal rate, regular rhythm and normal heart sounds.   No murmur heard. Pulmonary/Chest: She has no wheezes. She has no rales.  Intubated, coarse upper airway sounds  Abdominal: Soft. Bowel sounds are normal. She exhibits no distension.  Musculoskeletal: She exhibits edema. She exhibits no tenderness.  Neurological:  Intubated and sedated Withdraws from pain  Skin: Skin is dry.  Skin mottling has improved, now limited b/l legs, legs warm today  Nursing note and vitals reviewed.    LABS:  CBC  Recent Labs Lab 10/20/15 2242 10/21/15 0515 10/22/15 0503  WBC 47.1* 38.8* 19.7*  HGB 13.6 13.3 12.1  HCT 43.1 40.2 37.0  PLT 46* 23* 12*   Coag's  Recent Labs Lab 10/20/15 0957 10/21/15 0023  APTT 35  --   INR 1.46 1.52   BMET  Recent Labs Lab 10/21/15 0515 10/21/15 1755 10/22/15 0503  NA 135 136 137  137  K 3.9 4.2 4.0  4.0  CL 103 101 101  102  CO2 18* '23 26  24  '$ BUN 45* 34* 35*  34*  CREATININE 1.87* 1.59* 1.34*  1.34*  GLUCOSE 263* 317* 364*  356*   Electrolytes  Recent Labs Lab 10/21/15 0515 10/21/15 1755 10/22/15 0503  CALCIUM 7.3* 7.6* 7.7*  7.6*  MG 1.5* 1.6* 1.6*  PHOS 3.1 2.3* 1.9*   Sepsis Markers  Recent Labs Lab 10/20/15 1254 10/21/15 0515 10/21/15 1753  LATICACIDVEN 5.9* 3.7* 2.9*   ABG  Recent Labs Lab 10/20/15 2035 10/21/15 0847  PHART 7.13* 7.35  PCO2ART 41 33  PO2ART 103 193*   Liver Enzymes  Recent Labs Lab 10/20/15 0957 10/21/15 0515 10/21/15 1755 10/22/15 0503  AST 124* 312*  --  428*  ALT 63* 176*  --  276*   ALKPHOS 567* 391*  --  209*  BILITOT 2.5* 3.1*  --  4.8*  ALBUMIN 3.1* 2.1* 1.9* 1.8*  1.8*   Cardiac Enzymes  Recent Labs Lab 10/20/15 2242 10/21/15 1755 10/22/15 0106  TROPONINI 1.08* 0.44* 0.27*   Glucose  Recent Labs Lab 10/21/15 1706 10/21/15 1959 10/21/15 2342 10/22/15 0345 10/22/15 0722 10/22/15 0952  GLUCAP 233* 271* 317* 296* 301* 307*    Imaging Dg Chest Port 1 View  10/21/2015  CLINICAL DATA:  Acute respiratory failure. On ventilator. Acute renal failure. Non ST elevated myocardial infarct. Sepsis. EXAM: PORTABLE CHEST 1 VIEW COMPARISON:  10/20/2015 FINDINGS: New left internal jugular central venous catheter is seen with tip overlying the superior cavoatrial junction. No pneumothorax visualized. Right jugular central venous catheter remains in appropriate position as well as endotracheal tube and nasogastric tube. Patient is partially rotated to the right. Mild cardiomegaly and probable mild interstitial edema shows no significant change. Bibasilar atelectasis and probable small left pleural effusion are also stable. IMPRESSION: New left internal jugular central venous catheter in appropriate position. No pneumothorax visualized. No significant change in cardiomegaly, probable interstitial edema, bibasilar atelectasis, and probable small left pleural effusion. Electronically Signed   By: Earle Gell M.D.   On: 10/21/2015 16:48    Cultures: BCx2 x2 1/24>>Proteus UC 1/24>>Proteus Sputum 1/26>> BC x 2 1/26>> UC 1/26>>  Antibiotics: Vanc 1/24>>1/25 Zosyn 1/24>>1/25 Ceftriaxone 1/25>>   Lines: RIJ Vascath 1/24>> LIJ CVL 1/25>>  ASSESSMENT / PLAN: 70 yo Female with no sig PMHX, found down and unresponsive, intubated in the ED for respiratory distress, found to have leukocytosis, NSTEMI, UA concerning for infection, respiratory failure, possible rhabdomyolysis.   PULMONARY OETT 7.53m Respiratory failure - hypoxic Septic shock P:  - cont with MV - prn  ABG - wean MV as tolerated - maintain sats >90% - WUA\SBT daily  CARDIOVASCULAR CVL - RIJ VASCATH, LIJ CVL Shock - septic hypotension NSTEMI Afib - started on amiodarone 1/24  P:  - maintain MAP >65 - vasopressors as needed - attempting to wean, currently on levophed and vasopressin - monitor BP - trend CE - heparin gtt on hold due to low plt - mostly likely demand ischemia - EKG reviewed, no sig ST changes.  - cont with amiodarone - cardiology consult appreciated.   RENAL Anuria ARF UTI ?Rhabdomyolysis - CK improving, unlikely rhabdo Right hydronephrosis Proteus Bacteremia, Proteus UTI P:  -monitor Cr - crrt - appreciate nephro recs.  - follow up Ur Cx -current CK trending down, cont to follow, IVF -Etoh, salicylate levels normal  - U/S of Right Kidney with mild R hydro, WBC improving, pressors are being wean down - CT A/P was done prior to foley placement,  bladder is now decompressed.   GASTROINTESTINAL SUP - PPI Elevated LFTs - trend TF  HEMATOLOGIC Leukocytosis Thrombocytopenia - lower today P:  -most likely related to septic shock - source urine, now with Proteus Bacteremia -follow CBC -cont abx -stopped heparin 1/24 -CRRT on hold for 24 hrs  INFECTIOUS Sepsis - Proteus UTI and Bacteremia Still spiking fevers on antibiotics  P:  - cont with current abx - Bld and UC Cx with Protues, narrowed abx to ceftriaxone on 1/25, but spiked fevers overnight, repeat cultures.  - check KUB.   ENDOCRINE ICU hypo/hyperglycemia protocol  NEUROLOGIC A: Acute encephalopathy P:  RASS goal: -1 - metabolic encephalopathy - cont to monitor neuro status.    Thank you for consulting [facility], Please feel free to contacts [place] with any questions at [phone] (please enter 7-digits).  I have personally obtained a history, examined the patient, evaluated laboratory and imaging results, formulated the assessment and plan and  placed orders.  The Patient requires high complexity decision making for assessment and support, frequent evaluation and titration of therapies, application of advanced monitoring technologies and extensive interpretation of multiple databases. Critical Care Time devoted to patient care services described in this note is 45 minutes.   Overall, patient is critically ill, prognosis is guarded. Patient at high risk for cardiac arrest and death.   Vilinda Boehringer, MD  Pulmonary and Critical Care Pager [phone] (please enter 7-digits) On Call Pager [phone] (please enter 7-digits)  Note: This note was prepared with Dragon dictation along with smaller phrase technology. Any transcriptional errors that result from this process are unintentional.

## 2015-10-22 NOTE — Progress Notes (Signed)
Inpatient Diabetes Program Recommendations  AACE/ADA: New Consensus Statement on Inpatient Glycemic Control (2015)  Target Ranges:  Prepandial:   less than 140 mg/dL      Peak postprandial:   less than 180 mg/dL (1-2 hours)      Critically ill patients:  140 - 180 mg/dL   Review of Glycemic Control  Results for April [name], April [name] (MRN [id number]) as of 10/22/2015 08:11  Ref. Range 10/21/2015 17:06 10/21/2015 19:59 10/21/2015 23:42 10/22/2015 03:45 10/22/2015 07:22  Glucose-Capillary Latest Ref Range: 65-99 mg/dL 233 (H) 271 (H) 317 (H) 296 (H) 301 (H)    Diabetes history: A1C 7.7% Outpatient Diabetes medications: unknown Current orders for Inpatient glycemic control: Novolog correction insulin 0-20 units q4h  Inpatient Diabetes Program Recommendations: Based on current blood sugars , consider putting the patient on the ICU Glycemic control order set, phase 2.   Gentry Fitz, RN, BA, MHA, CDE Diabetes Coordinator Inpatient Diabetes Program  [phone] (Team Pager) [phone] ([place]) 10/22/2015 8:14 AM

## 2015-10-22 NOTE — Progress Notes (Signed)
[facility]  Daily Progress Note   Subjective  - * No surgery found *  Remains intubated and sedated, no major events overnight Line working well  Objective Filed Vitals:   10/22/15 0800 10/22/15 0900 10/22/15 1000 10/22/15 1100  BP: 1'13/78 98/70 96/70 '$ 98/70  Pulse: 97 96 92 74  Temp:   101.3 F (38.5 C) 101.3 F (38.5 C)  TempSrc:      Resp: '30 30 30 30  '$ Height:      Weight:      SpO2: 95% 94% 93% 100%    Intake/Output Summary (Last 24 hours) at 10/22/15 1242 Last data filed at 10/22/15 1000  Gross per 24 hour  Intake 1645.6 ml  Output    636 ml  Net 1009.6 ml    PULM  CTAB CV  RRR VASC  Line in place, C/d/I Feet mottled  Laboratory CBC    Component Value Date/Time   WBC 19.7* 10/22/2015 0503   HGB 12.1 10/22/2015 0503   HCT 37.0 10/22/2015 0503   PLT 12* 10/22/2015 0503    BMET    Component Value Date/Time   NA 137 10/22/2015 0503   NA 137 10/22/2015 0503   K 4.0 10/22/2015 0503   K 4.0 10/22/2015 0503   CL 102 10/22/2015 0503   CL 101 10/22/2015 0503   CO2 24 10/22/2015 0503   CO2 26 10/22/2015 0503   GLUCOSE 356* 10/22/2015 0503   GLUCOSE 364* 10/22/2015 0503   BUN 34* 10/22/2015 0503   BUN 35* 10/22/2015 0503   CREATININE 1.34* 10/22/2015 0503   CREATININE 1.34* 10/22/2015 0503   CALCIUM 7.6* 10/22/2015 0503   CALCIUM 7.7* 10/22/2015 0503   GFRNONAA 39* 10/22/2015 0503   GFRNONAA 39* 10/22/2015 0503   GFRAA 46* 10/22/2015 0503   GFRAA 46* 10/22/2015 0503    Assessment/Planning: POD #1 s/p central line placement.   Line working well  Feet still mottled and ischemic.  Suspect she will ultimately need AKAs if she survives.  No other recs at this time, call with questions    DEW,JASON  10/22/2015, 12:42 PM

## 2015-10-22 NOTE — Progress Notes (Addendum)
[facility] NAME: April [name]    MR#:  [id number]  DATE OF BIRTH:  08/12/1946  SUBJECTIVE:  Remains intubated and on the vent Day 3 On CRRT On IV fentanyl gtt -IV rocephin - no fver TF tolerated -sugars high, A1c 7.7 REVIEW OF SYSTEMS:   Review of Systems  Unable to perform ROS: intubated   Tolerating Diet:TF Tolerating PT: intubated  DRUG ALLERGIES:   Allergies  Allergen Reactions  . Prednisone Anaphylaxis    VITALS:  Blood pressure 98/70, pulse 96, temperature 100.9 F (38.3 C), temperature source Core (Comment), resp. rate 30, height '5\' 7"'$  (1.702 m), weight 122.3 kg (269 lb 10 oz), SpO2 94 %.  PHYSICAL EXAMINATION:   Physical Exam  GENERAL:  70 y.o.-year-old patient lying in the bed critically ill, intubated and on the vent no acute distress. Obese EYES: Pupils equal, round, reactive to light and accommodation. No scleral icterus. Extraocular muscles intact.  HEENT: Head atraumatic, normocephalic. Oropharynx and nasopharynx clear. intubated and on the vent NECK:  Supple, no jugular venous distention. No thyroid enlargement, no tenderness.  LUNGS: distant breath sounds bilaterally, no wheezing, rales, rhonchi. No use of accessory muscles of respiration.  CARDIOVASCULAR: S1, S2 normal. No murmurs, rubs, or gallops.  ABDOMEN: Soft, nontender, nondistended. Bowel sounds present. No organomegaly or mass. Foley+ EXTREMITIES: severe bilateral mottling, feet now warm to touch. Pulses per RN are dopplerable NEUROLOGIC: on the vent PSYCHIATRIC: on the vent SKIN: severe bilateral LE mottling++  LABORATORY PANEL:  CBC  Recent Labs Lab 10/22/15 0503  WBC 19.7*  HGB 12.1  HCT 37.0  PLT 12*    Chemistries   Recent Labs Lab 10/22/15 0503  NA 137  137  K 4.0  4.0  CL 101  102  CO2 26  24  GLUCOSE 364*  356*  BUN 35*  34*  CREATININE 1.34*  1.34*  CALCIUM 7.7*  7.6*  MG 1.6*  AST 428*  ALT  276*  ALKPHOS 209*  BILITOT 4.8*   Cardiac Enzymes  Recent Labs Lab 10/22/15 0106  TROPONINI 0.27*   RADIOLOGY:  Ct Abdomen Pelvis Wo Contrast  10/20/2015  CLINICAL DATA:  Patient had not been hurt from for couple days, was found by EMS between the wall and the bed, responsive to pain, leukocytosis, elevated glucose and creatinine, abdominal swelling, pelvic pain EXAM: CT ABDOMEN AND PELVIS WITHOUT CONTRAST TECHNIQUE: Multidetector CT imaging of the abdomen and pelvis was performed following the standard protocol without IV contrast. Sagittal and coronal MPR images reconstructed from axial data set. COMPARISON:  None FINDINGS: Bibasilar atelectasis. Calcifications at LEFT lung base may represent calcified granuloma within atelectatic lower lobe. Marked RIGHT hydronephrosis without definite ureteral calcification or dilatation. Edema surrounding the RIGHT kidney can be related to obstruction. Small nonobstructing calculus 4 mm diameter RIGHT kidney image 51. Beam hardening artifacts from patient's arms traverse upper abdomen. Within limits of noncontrast technique and beam hardening artifacts, no definite additional abnormalities of the liver, spleen, atrophic pancreas, or kidneys. BILATERAL thickened adrenal glands without discrete mass. Gallbladder distended without mass or surrounding inflammatory changes. Extensive colonic diverticulosis without evidence of diverticulitis. Stomach and remaining bowel loops normal appearance. Small umbilical hernia containing fat. Normal appendix. Unremarkable uterus and RIGHT adnexa. LEFT ovary prominent size for age at 3.8 x 3.6 x 3.6 cm. Bladder decompressed by Foley catheter. Shotty retroperitoneal nodes without adenopathy. No mass, adenopathy, free air or free fluid. Scattered atherosclerotic calcifications. Bones  demineralized. IMPRESSION: RIGHT hydronephrosis question UPJ obstruction; in the setting of leukocytosis, recommend correlation with urinalysis to  exclude urinary tract infection. Scattered colonic diverticulosis. Prominent LEFT ovarian size for age ; non emergent followup pelvic and transvaginal sonography recommended to characterize. Electronically Signed   By: Lavonia Dana M.D.   On: 10/20/2015 12:50   Dg Chest 1 View  10/20/2015  CLINICAL DATA:  Patient found down and minimally responsive 10/20/2015. Status post central line placement. EXAM: CHEST 1 VIEW COMPARISON:  Single view of the chest earlier today. FINDINGS: A new right IJ catheter is in place with the tip projecting over the lower superior vena cava. There is no pneumothorax. Endotracheal tube is again seen. Defibrillator pad is in place. There is no pneumothorax. Small left effusion and basilar atelectasis are identified. Mild interstitial edema. IMPRESSION: Right IJ catheter tip projects over the lower superior vena cava. Negative for pneumothorax. Mild interstitial edema with a small left effusion and basilar atelectasis. Electronically Signed   By: Inge Rise M.D.   On: 10/20/2015 18:46   Dg Abd 1 View  10/21/2015  CLINICAL DATA:  Orogastric tube placement. EXAM: ABDOMEN - 1 VIEW COMPARISON:  CT 10/20/2015. FINDINGS: Orogastric tube noted projected over the distal stomach. No bowel distention. Degenerative changes lumbar spine. IMPRESSION: Orogastric tube noted with its tip the left projected over the distal stomach . No bowel distention. Electronically Signed   By: [place]   On: 10/21/2015 07:21   Ct Head Wo Contrast  10/20/2015  CLINICAL DATA:  Found unresponsive today. EXAM: CT HEAD WITHOUT CONTRAST CT CERVICAL SPINE WITHOUT CONTRAST TECHNIQUE: Multidetector CT imaging of the head and cervical spine was performed following the standard protocol without intravenous contrast. Multiplanar CT image reconstructions of the cervical spine were also generated. COMPARISON:  None. FINDINGS: CT HEAD FINDINGS Normal appearing cerebral hemispheres and posterior fossa structures.  Normal size and position of the ventricles. No intracranial hemorrhage, mass lesion or CT evidence of acute infarction. Unremarkable bones. Mild mucosal thickening involving all of the paranasal sinuses with a small amount of fluid in the sphenoid sinus bilaterally. CT CERVICAL SPINE FINDINGS An endotracheal tube is in place. Multilevel degenerative changes. Straightening of the normal cervical lordosis. No prevertebral soft tissue swelling, fractures or subluxations. Bilateral carotid artery calcifications. IMPRESSION: 1. No acute intracranial abnormality. 2. No cervical spine fracture or subluxation. 3. Mild chronic pansinusitis with an acute component in the sphenoid sinuses. 4. Cervical spine degenerative changes. 5. Bilateral carotid artery atheromatous calcifications. Electronically Signed   By: Claudie Revering M.D.   On: 10/20/2015 13:25   Ct Cervical Spine Wo Contrast  10/20/2015  CLINICAL DATA:  Found unresponsive today. EXAM: CT HEAD WITHOUT CONTRAST CT CERVICAL SPINE WITHOUT CONTRAST TECHNIQUE: Multidetector CT imaging of the head and cervical spine was performed following the standard protocol without intravenous contrast. Multiplanar CT image reconstructions of the cervical spine were also generated. COMPARISON:  None. FINDINGS: CT HEAD FINDINGS Normal appearing cerebral hemispheres and posterior fossa structures. Normal size and position of the ventricles. No intracranial hemorrhage, mass lesion or CT evidence of acute infarction. Unremarkable bones. Mild mucosal thickening involving all of the paranasal sinuses with a small amount of fluid in the sphenoid sinus bilaterally. CT CERVICAL SPINE FINDINGS An endotracheal tube is in place. Multilevel degenerative changes. Straightening of the normal cervical lordosis. No prevertebral soft tissue swelling, fractures or subluxations. Bilateral carotid artery calcifications. IMPRESSION: 1. No acute intracranial abnormality. 2. No cervical spine fracture or  subluxation.  3. Mild chronic pansinusitis with an acute component in the sphenoid sinuses. 4. Cervical spine degenerative changes. 5. Bilateral carotid artery atheromatous calcifications. Electronically Signed   By: Claudie Revering M.D.   On: 10/20/2015 13:25   US Renal  10/21/2015  CLINICAL DATA:  Acute renal failure. EXAM: RENAL / URINARY TRACT ULTRASOUND COMPLETE COMPARISON:  CT 10/20/2015. FINDINGS: Right Kidney: Length: 12.3 cm. Echogenicity within normal limits. No mass. Mild right hydronephrosis. Left Kidney: Length: 13.0 cm. Echogenicity within normal limits. No mass or hydronephrosis visualized. Bladder: Foley catheter in bladder. Bladder decompressed. Limited exam due to body habitus . IMPRESSION: 1.  Mild right hydronephrosis.  Similar finding noted on prior CT. 2. Foley catheter in bladder.  Bladder decompressed. Electronically Signed   By: Marcello Moores  Register   On: 10/21/2015 10:09   Dg Chest Port 1 View  10/21/2015  CLINICAL DATA:  Acute respiratory failure. On ventilator. Acute renal failure. Non ST elevated myocardial infarct. Sepsis. EXAM: PORTABLE CHEST 1 VIEW COMPARISON:  10/20/2015 FINDINGS: New left internal jugular central venous catheter is seen with tip overlying the superior cavoatrial junction. No pneumothorax visualized. Right jugular central venous catheter remains in appropriate position as well as endotracheal tube and nasogastric tube. Patient is partially rotated to the right. Mild cardiomegaly and probable mild interstitial edema shows no significant change. Bibasilar atelectasis and probable small left pleural effusion are also stable. IMPRESSION: New left internal jugular central venous catheter in appropriate position. No pneumothorax visualized. No significant change in cardiomegaly, probable interstitial edema, bibasilar atelectasis, and probable small left pleural effusion. Electronically Signed   By: Earle Gell M.D.   On: 10/21/2015 16:48   Dg Chest Port 1 View  10/20/2015   CLINICAL DATA:  Hypoxia. EXAM: PORTABLE CHEST 1 VIEW COMPARISON:  None. FINDINGS: Endotracheal tube tip is 4.5 cm above the carina. No pneumothorax. There is no edema or consolidation. Heart size and pulmonary vascularity within normal limits. No adenopathy. No bone lesions. IMPRESSION: Endotracheal tube as described without pneumothorax. No edema or consolidation. Electronically Signed   By: Lowella Grip III M.D.   On: 10/20/2015 10:53   ASSESSMENT AND PLAN:   70 year old female presented to the emergency room after she was found lying on the floor with minimal responsiveness.  1. Severe Septic shock, hypovolemic shock: due to proteus, source urine  -Patient's currently intubated on ventilator Day 3  -Patient is on levopeded and vasopressin. - Continue IV Rocephin. -  blood and urine culture growing Proteus Mirabilis - Appreciate pulmonary consultation.  2. Acute renal failure: - is due to ATN in the setting of sepsis and rhabdomyolysis. Patient was anuric.  -Patient is  undergoing emergent CRRT. -CT scan shows right-sided perinephric edema. -Appreciate nephrology consultation.  3. Severe leukocytosis: This is due to usepsis and also likely due to dehydration/anuria. White blood cell has improved. 50---47--38--19K  4. Atrial fibrillation with RVR: Patient is now an amiodarone drip. Patient has elevated troponins which may be due to demand ischemia from acute issues - Continue amiodarone drip.  -Follow-up on echocardiogram  5. DM-2 , noew onset -HbA1c of 7.7 -Start hyperglycemia gtt  6. UTI: Continue Rocephin   7. Acute rhabdomyolysis secondary to patient being found down on the floor: Continue to trend CPK  8. Severe TCP came in with 62---46--23--12 likely consumption pf plts given severe sepsis ,?DIC No antiplt agents  -no active bleeding -hematology consult  Case discussed with Care Management/Social Worker.  CODE STATUS: full  DVT Prophylaxis: SCD/TED  TOTAL  critical TIME TAKING CARE OF THIS PATIENT: 35 minutes.  >50% time spent on counselling and coordination of care dr Stevenson Clinch  Note: This dictation was prepared with Dragon dictation along with smaller phrase technology. Any transcriptional errors that result from this process are unintentional.  July Nickson M.D on 10/22/2015 at 9:42 AM  Between 7am to 6pm - Pager - [phone]  After 6pm go to [email or web address] - password [email or web address] [facility]  Office  [phone]  CC: Primary care physician; Albina Billet, MD

## 2015-10-22 NOTE — Progress Notes (Signed)
Reported critical platelet count of 12 to Dr. Mortimer Fries given orders to type and screen pt at this time will not transfuse no active s/s of bleeding

## 2015-10-22 NOTE — Progress Notes (Signed)
Dr Stevenson Clinch aware of patients platelet count of 14. No new orders. Stopped CRRT this am per Dr Stevenson Clinch. Will review in the next 24 hours.

## 2015-10-22 NOTE — Progress Notes (Signed)
Pt remains intubated FiO2 at 40% with O2 sats 95 to 100%; vss with levophed and vasopressin infusing; sedation decreased pt became agitated and reached for ET tube however she did not follow commands sedation restarted; minimal uop via foley; CRRT infusing without complications; afibb on cardiac monitor; pts family updated about plan of care and questions will continue to monitor and assess pt

## 2015-10-22 NOTE — Progress Notes (Signed)
[facility] NOTE  Pharmacy Consult for CRRT medication management, Ceftriaxone Dosing, Electrolyte Dosing, Constipation Prevention Indication: CRRT/ Proteus Bacteremia     Allergies  Allergen Reactions  . Prednisone Anaphylaxis    Patient Measurements: Height: '5\' 7"'$  (170.2 cm) Weight: 269 lb 10 oz (122.3 kg) IBW/kg (Calculated) : 61.6   Vital Signs: Temp: 101.8 F (38.8 C) (01/26 2200) BP: 102/64 mmHg (01/26 2200) Pulse Rate: 74 (01/26 2200) Intake/Output from previous day: 01/25 0701 - 01/26 0700 In: 2848.7 [I.V.:2733.7; NG/GT:50; IV Piggyback:50] Out: 598 [Urine:115] Intake/Output from this shift:   Vent settings for last 24 hours: Vent Mode:  [-] PRVC FiO2 (%):  [30 %-40 %] 30 % Set Rate:  [30 bmp] 30 bmp Vt Set:  [500 mL] 500 mL PEEP:  [5 cmH20] 5 cmH20 Plateau Pressure:  [22 cmH20-26 cmH20] 26 cmH20  Labs:  Recent Labs  10/20/15 0957  10/21/15 0023  10/21/15 0515 10/21/15 1755 10/22/15 0503 10/22/15 1323 10/22/15 2053  WBC 50.5*  < >  --   --  38.8*  --  19.7* 18.8*  --   HGB 16.4*  < >  --   --  13.3  --  12.1 12.3  --   HCT 49.9*  < >  --   --  40.2  --  37.0 36.8  --   PLT 62*  < >  --   --  23*  --  12* 14*  --   APTT 35  --   --   --   --   --   --   --   --   INR 1.46  --  1.52  --   --   --   --   --   --   CREATININE 2.95*  3.01*  --   --   --  1.87* 1.59* 1.34*  1.34* 1.72* 2.06*  MG  --   --   --   --  1.5* 1.6* 1.6*  --   --   PHOS  --   --   --   < > 3.1 2.3* 1.9* 1.7* 2.8  ALBUMIN 3.1*  --   --   --  2.1* 1.9* 1.8*  1.8* 1.8* 1.8*  PROT 7.4  --   --   --  5.3*  --  4.9*  --   --   AST 124*  --   --   --  312*  --  428*  --   --   ALT 63*  --   --   --  176*  --  276*  --   --   ALKPHOS 567*  --   --   --  391*  --  209*  --   --   BILITOT 2.5*  --   --   --  3.1*  --  4.8*  --   --   < > = values in this interval not displayed. Estimated Creatinine Clearance: 35 mL/min (by C-G formula based on Cr of  2.06).   Recent Labs  10/22/15 2015 10/22/15 2118 10/22/15 2210  GLUCAP 123* 132* 105*    Microbiology: Recent Results (from the past 720 hour(s))  Urine culture     Status: None   Collection Time: 10/20/15  9:57 AM  Result Value Ref Range Status   Specimen Description URINE, RANDOM  Final   Special Requests NONE  Final   Culture >=100,000 COLONIES/mL PROTEUS MIRABILIS  Final   Report Status 10/22/2015 FINAL  Final   Organism ID, Bacteria PROTEUS MIRABILIS  Final      Susceptibility   Proteus mirabilis - MIC*    AMPICILLIN <=2 SENSITIVE Sensitive     GENTAMICIN <=1 SENSITIVE Sensitive     CEFTRIAXONE Value in next row Sensitive      SENSITIVE<=1    CIPROFLOXACIN Value in next row Sensitive      SENSITIVE<=0.25    IMIPENEM Value in next row Sensitive      SENSITIVE2    NITROFURANTOIN Value in next row Resistant      RESISTANT128    TRIMETH/SULFA Value in next row Sensitive      SENSITIVE<=20    PIP/TAZO Value in next row Sensitive      SENSITIVE<=4    AMPICILLIN/SULBACTAM Value in next row Sensitive      SENSITIVE<=2    * >=100,000 COLONIES/mL PROTEUS MIRABILIS  Culture, blood (routine x 2)     Status: None   Collection Time: 10/20/15  9:57 AM  Result Value Ref Range Status   Specimen Description BLOOD RIGHT WRIST  Final   Special Requests   Final    BOTTLES DRAWN AEROBIC AND ANAEROBIC ANA 1ML AER 4ML   Culture  Setup Time   Final    GRAM NEGATIVE RODS IN BOTH AEROBIC AND ANAEROBIC BOTTLES CRITICAL RESULT CALLED TO, READ BACK BY AND VERIFIED WITH: MATT MCBANE ON 10/21/15 AT 0005 [facility] CONFIRMED BY [facility]    Culture   Final    PROTEUS MIRABILIS IN BOTH AEROBIC AND ANAEROBIC BOTTLES    Report Status 10/22/2015 FINAL  Final   Organism ID, Bacteria PROTEUS MIRABILIS  Final      Susceptibility   Proteus mirabilis - MIC*    AMPICILLIN/SULBACTAM Value in next row Sensitive      SENSITIVE<=2    PIP/TAZO Value in next row Sensitive      SENSITIVE<=4    CEFTRIAXONE  Value in next row Sensitive      SENSITIVE<=1    IMIPENEM Value in next row Sensitive      SENSITIVE2    GENTAMICIN Value in next row Sensitive      SENSITIVE<=1    CIPROFLOXACIN Value in next row Sensitive      SENSITIVE<=0.25    * PROTEUS MIRABILIS  Culture, blood (routine x 2)     Status: None   Collection Time: 10/20/15  9:57 AM  Result Value Ref Range Status   Specimen Description BLOOD LEFT HAND  Final   Special Requests   Final    BOTTLES DRAWN AEROBIC AND ANAEROBIC AER 3ML ANA 2ML   Culture  Setup Time   Final    GRAM NEGATIVE RODS IN BOTH AEROBIC AND ANAEROBIC BOTTLES CRITICAL VALUE NOTED.  VALUE IS CONSISTENT WITH PREVIOUSLY REPORTED AND CALLED VALUE.    Culture   Final    PROTEUS MIRABILIS IN BOTH AEROBIC AND ANAEROBIC BOTTLES    Report Status 10/22/2015 FINAL  Final   Organism ID, Bacteria PROTEUS MIRABILIS  Final      Susceptibility   Proteus mirabilis - MIC*    AMPICILLIN Value in next row Sensitive      SENSITIVE<=2    PIP/TAZO Value in next row Sensitive      SENSITIVE<=4    CEFTAZIDIME Value in next row Sensitive      SENSITIVE<=1    CEFTRIAXONE Value in next row Sensitive      SENSITIVE<=1    CEFEPIME Value  in next row Sensitive      SENSITIVE<=1    IMIPENEM Value in next row Sensitive      SENSITIVE2    GENTAMICIN Value in next row Sensitive      SENSITIVE<=1    CIPROFLOXACIN Value in next row Sensitive      SENSITIVE<=0.25    * PROTEUS MIRABILIS  Blood Culture ID Panel (Reflexed)     Status: Abnormal   Collection Time: 10/20/15  9:57 AM  Result Value Ref Range Status   Enterococcus species NOT DETECTED NOT DETECTED Final   Listeria monocytogenes NOT DETECTED NOT DETECTED Final   Staphylococcus species NOT DETECTED NOT DETECTED Final   Staphylococcus aureus NOT DETECTED NOT DETECTED Final   Streptococcus species NOT DETECTED NOT DETECTED Final   Streptococcus agalactiae NOT DETECTED NOT DETECTED Final   Streptococcus pneumoniae NOT DETECTED NOT  DETECTED Final   Streptococcus pyogenes NOT DETECTED NOT DETECTED Final   Acinetobacter baumannii NOT DETECTED NOT DETECTED Final   Enterobacteriaceae species DETECTED (A) NOT DETECTED Final    Comment: CRITICAL RESULT CALLED TO, READ BACK BY AND VERIFIED WITH: MATT MCBANE ON 10/21/15 AT 0005 [place]    Enterobacter cloacae complex NOT DETECTED NOT DETECTED Final   Escherichia coli NOT DETECTED NOT DETECTED Final   Klebsiella oxytoca NOT DETECTED NOT DETECTED Final   Klebsiella pneumoniae NOT DETECTED NOT DETECTED Final   Proteus species (A) NOT DETECTED Final    CRITICAL RESULT CALLED TO, READ BACK BY AND VERIFIED WITH:    Comment: MATT MCBANE ON 10/21/15 AT 0005 [place]   Serratia marcescens NOT DETECTED NOT DETECTED Final   Haemophilus influenzae NOT DETECTED NOT DETECTED Final   Neisseria meningitidis NOT DETECTED NOT DETECTED Final   Pseudomonas aeruginosa NOT DETECTED NOT DETECTED Final   Candida albicans NOT DETECTED NOT DETECTED Final   Candida glabrata NOT DETECTED NOT DETECTED Final   Candida krusei NOT DETECTED NOT DETECTED Final   Candida parapsilosis NOT DETECTED NOT DETECTED Final   Candida tropicalis NOT DETECTED NOT DETECTED Final   Carbapenem resistance NOT DETECTED NOT DETECTED Final   Methicillin resistance NOT DETECTED NOT DETECTED Final   Vancomycin resistance NOT DETECTED NOT DETECTED Final  MRSA PCR Screening     Status: None   Collection Time: 10/20/15  7:41 PM  Result Value Ref Range Status   MRSA by PCR NEGATIVE NEGATIVE Final    Comment:        The GeneXpert MRSA Assay (FDA approved for NASAL specimens only), is one component of a comprehensive MRSA colonization surveillance program. It is not intended to diagnose MRSA infection nor to guide or monitor treatment for MRSA infections.   Culture, expectorated sputum-assessment     Status: None   Collection Time: 10/22/15 12:00 PM  Result Value Ref Range Status   Specimen Description SPUTUM  Final   Special  Requests Normal  Final   Sputum evaluation THIS SPECIMEN IS ACCEPTABLE FOR SPUTUM CULTURE  Final   Report Status 10/22/2015 FINAL  Final    Medications:  Scheduled:  . antiseptic oral rinse  7 mL Mouth Rinse 6 times per day  . cefTRIAXone (ROCEPHIN)  IV  2 g Intravenous Q24H  . chlorhexidine gluconate  15 mL Mouth/Throat BID  . feeding supplement (PRO-STAT SUGAR FREE 64)  30 mL Per Tube TID  . free water  100 mL Per Tube 3 times per day  . hydrocortisone sod succinate (SOLU-CORTEF) inj  50 mg Intravenous Q6H  . pantoprazole (PROTONIX)  IV  40 mg Intravenous QHS  . senna-docusate  1 tablet Oral BID  . sodium phosphate  Dextrose 5% IVPB  30 mmol Intravenous Once   Infusions:  . amiodarone 60 mg/hr (10/22/15 2200)  . feeding supplement (VITAL AF 1.2 CAL) 1,000 mL (10/22/15 2200)  . fentaNYL infusion INTRAVENOUS 120 mcg/hr (10/22/15 2200)  . insulin (NOVOLIN-R) infusion 1.9 Units/hr (10/22/15 2214)  . norepinephrine (LEVOPHED) Adult infusion 10 mcg/min (10/22/15 2200)  . pureflow 2,000 mL/hr at 10/22/15 0104  . vasopressin (PITRESSIN) infusion - *FOR SHOCK* 0.03 Units/min (10/22/15 2200)    Assessment: Pharmacy consulted to adjust medications for 70 yo female ICU patient requiring mechanical ventilation and CRRT. Patient previously ordered heparin drip for NSTEMI, discontinued to due to thrombocytopenia.     Plan:  1. CRRT placed on hold secondary to thrombocytopenia. Continue current regimen.   2. Patient initially ordered vancomycin and Zosyn, narrowed to ceftriaxone for proteus bacteremai/UTI. Will continue patient on ceftriaxone 2g IV Q24hr. Will follow cultures and adjust as appropriate.    3. Patient given magnesium 2g IV x 1 and Sodium Phosphate 30 mmol IV x 1. Will recheck electrolytes with am labs.   4. Will start patient on senna/docusate 1 tab VT BID.   Pharmacy will continue to monitor and adjust per consult.    Jo Booze L 10/22/2015,10:40 PM

## 2015-10-23 ENCOUNTER — Inpatient Hospital Stay: Admit: 2015-10-23 | Payer: Medicare HMO

## 2015-10-23 ENCOUNTER — Inpatient Hospital Stay: Payer: Medicare HMO

## 2015-10-23 DIAGNOSIS — J9601 Acute respiratory failure with hypoxia: Secondary | ICD-10-CM

## 2015-10-23 DIAGNOSIS — A4159 Other Gram-negative sepsis: Principal | ICD-10-CM

## 2015-10-23 DIAGNOSIS — J9690 Respiratory failure, unspecified, unspecified whether with hypoxia or hypercapnia: Secondary | ICD-10-CM | POA: Insufficient documentation

## 2015-10-23 DIAGNOSIS — I4891 Unspecified atrial fibrillation: Secondary | ICD-10-CM

## 2015-10-23 LAB — RENAL FUNCTION PANEL
ANION GAP: 13 (ref 5–15)
Albumin: 1.7 g/dL — ABNORMAL LOW (ref 3.5–5.0)
BUN: 60 mg/dL — ABNORMAL HIGH (ref 6–20)
CALCIUM: 7.7 mg/dL — AB (ref 8.9–10.3)
CO2: 25 mmol/L (ref 22–32)
Chloride: 100 mmol/L — ABNORMAL LOW (ref 101–111)
Creatinine, Ser: 2.48 mg/dL — ABNORMAL HIGH (ref 0.44–1.00)
GFR calc non Af Amer: 19 mL/min — ABNORMAL LOW (ref >60)
GFR, EST AFRICAN AMERICAN: 22 mL/min — AB (ref >60)
Glucose, Bld: 246 mg/dL — ABNORMAL HIGH (ref 65–99)
PHOSPHORUS: 2.8 mg/dL (ref 2.5–4.6)
Potassium: 3.6 mmol/L (ref 3.5–5.1)
SODIUM: 138 mmol/L (ref 135–145)

## 2015-10-23 LAB — GLUCOSE, CAPILLARY
GLUCOSE-CAPILLARY: 207 mg/dL — AB (ref 65–99)
GLUCOSE-CAPILLARY: 227 mg/dL — AB (ref 65–99)
GLUCOSE-CAPILLARY: 259 mg/dL — AB (ref 65–99)
GLUCOSE-CAPILLARY: 259 mg/dL — AB (ref 65–99)
GLUCOSE-CAPILLARY: 61 mg/dL — AB (ref 65–99)
Glucose-Capillary: 187 mg/dL — ABNORMAL HIGH (ref 65–99)
Glucose-Capillary: 203 mg/dL — ABNORMAL HIGH (ref 65–99)
Glucose-Capillary: 84 mg/dL (ref 65–99)
Glucose-Capillary: 140 mg/dL — ABNORMAL HIGH (ref 65–99)
Glucose-Capillary: 230 mg/dL — ABNORMAL HIGH (ref 65–99)
Glucose-Capillary: 230 mg/dL — ABNORMAL HIGH (ref 65–99)
Glucose-Capillary: 323 mg/dL — ABNORMAL HIGH (ref 65–99)
Glucose-Capillary: 264 mg/dL — ABNORMAL HIGH (ref 65–99)

## 2015-10-23 LAB — CBC
HEMATOCRIT: 36.1 % (ref 35.0–47.0)
HEMOGLOBIN: 12.1 g/dL (ref 12.0–16.0)
MCH: 29.5 pg (ref 26.0–34.0)
MCHC: 33.4 g/dL (ref 32.0–36.0)
MCV: 88.2 fL (ref 80.0–100.0)
Platelets: 10 10*3/uL — CL (ref 150–440)
RBC: 4.1 MIL/uL (ref 3.80–5.20)
RDW: 14.5 % (ref 11.5–14.5)
WBC: 23.4 10*3/uL — AB (ref 3.6–11.0)

## 2015-10-23 LAB — TRIGLYCERIDES: TRIGLYCERIDES: 253 mg/dL — AB (ref <150)

## 2015-10-23 LAB — MAGNESIUM: MAGNESIUM: 2.2 mg/dL (ref 1.7–2.4)

## 2015-10-23 MED ORDER — DEXTROSE 50 % IV SOLN
INTRAVENOUS | Status: AC
  Administered 2015-10-23: 16 mL
  Filled 2015-10-23: qty 50

## 2015-10-23 MED ORDER — INSULIN ASPART 100 UNIT/ML ~~LOC~~ SOLN
5.0000 [IU] | Freq: Four times a day (QID) | SUBCUTANEOUS | Status: DC
  Administered 2015-10-23 – 2015-10-24 (×5): 5 [IU] via SUBCUTANEOUS
  Filled 2015-10-23 (×5): qty 5

## 2015-10-23 MED ORDER — SODIUM CHLORIDE 0.9 % IV SOLN
1.0000 g | Freq: Two times a day (BID) | INTRAVENOUS | Status: DC
  Administered 2015-10-23 – 2015-10-24 (×3): 1 g via INTRAVENOUS
  Filled 2015-10-23 (×4): qty 1

## 2015-10-23 MED ORDER — DIAZEPAM 5 MG/ML IJ SOLN
5.0000 mg | Freq: Four times a day (QID) | INTRAMUSCULAR | Status: AC
  Administered 2015-10-23 – 2015-10-24 (×4): 5 mg via INTRAVENOUS
  Filled 2015-10-23 (×4): qty 2

## 2015-10-23 MED ORDER — DEXMEDETOMIDINE HCL IN NACL 400 MCG/100ML IV SOLN
0.0000 ug/kg/h | INTRAVENOUS | Status: AC
  Administered 2015-10-24: 0.1 ug/kg/h via INTRAVENOUS
  Administered 2015-10-25: 0.3 ug/kg/h via INTRAVENOUS
  Administered 2015-10-25: 0.6 ug/kg/h via INTRAVENOUS
  Administered 2015-10-25: 1.2 ug/kg/h via INTRAVENOUS
  Administered 2015-10-26 (×2): 0.8 ug/kg/h via INTRAVENOUS
  Administered 2015-10-26: 0.7 ug/kg/h via INTRAVENOUS
  Filled 2015-10-23 (×5): qty 100
  Filled 2015-10-23: qty 50
  Filled 2015-10-23 (×3): qty 100

## 2015-10-23 MED ORDER — INSULIN ASPART 100 UNIT/ML ~~LOC~~ SOLN
0.0000 [IU] | SUBCUTANEOUS | Status: DC
  Administered 2015-10-23: 8 [IU] via SUBCUTANEOUS
  Administered 2015-10-23: 11 [IU] via SUBCUTANEOUS
  Administered 2015-10-23 – 2015-10-24 (×2): 8 [IU] via SUBCUTANEOUS
  Administered 2015-10-24: 3 [IU] via SUBCUTANEOUS
  Administered 2015-10-24 (×4): 5 [IU] via SUBCUTANEOUS
  Administered 2015-10-25 (×4): 3 [IU] via SUBCUTANEOUS
  Administered 2015-10-25 – 2015-10-26 (×3): 5 [IU] via SUBCUTANEOUS
  Filled 2015-10-23: qty 3
  Filled 2015-10-23: qty 8
  Filled 2015-10-23: qty 5
  Filled 2015-10-23: qty 11
  Filled 2015-10-23 (×2): qty 3
  Filled 2015-10-23: qty 5
  Filled 2015-10-23 (×2): qty 3
  Filled 2015-10-23: qty 8
  Filled 2015-10-23 (×2): qty 5
  Filled 2015-10-23: qty 8
  Filled 2015-10-23 (×2): qty 5

## 2015-10-23 MED ORDER — PROPOFOL 1000 MG/100ML IV EMUL
5.0000 ug/kg/min | INTRAVENOUS | Status: DC
  Filled 2015-10-23: qty 100

## 2015-10-23 MED ORDER — SENNOSIDES-DOCUSATE SODIUM 8.6-50 MG PO TABS
2.0000 | ORAL_TABLET | Freq: Two times a day (BID) | ORAL | Status: DC
  Administered 2015-10-23 – 2015-10-26 (×5): 2 via ORAL
  Filled 2015-10-23 (×5): qty 2

## 2015-10-23 MED ORDER — PANTOPRAZOLE SODIUM 40 MG PO PACK
40.0000 mg | PACK | ORAL | Status: DC
  Administered 2015-10-23 – 2015-10-28 (×6): 40 mg
  Filled 2015-10-23 (×7): qty 20

## 2015-10-23 MED ORDER — AMIODARONE HCL 200 MG PO TABS
400.0000 mg | ORAL_TABLET | Freq: Two times a day (BID) | ORAL | Status: DC
  Administered 2015-10-23 – 2015-10-30 (×15): 400 mg via ORAL
  Filled 2015-10-23 (×15): qty 2

## 2015-10-23 MED ORDER — BISACODYL 10 MG RE SUPP
10.0000 mg | Freq: Once | RECTAL | Status: AC
  Administered 2015-10-23: 10 mg via RECTAL
  Filled 2015-10-23: qty 1

## 2015-10-23 NOTE — Progress Notes (Signed)
Nutrition Follow-up    INTERVENTION:   EN: tolerating TF at current rate, recommend increasing to rate of 40 ml/hr at present, goal rate of 50 ml/hr.  Monitor electrolytes, volume status as CRRT on hold at present   NUTRITION DIAGNOSIS:   Inadequate oral intake related to acute illness as evidenced by NPO status.  GOAL:   Provide needs based on ASPEN/SCCM guidelines  MONITOR:    (Energy Intake, Pulmonary, Digestive System, Electrolyte/Renal Profile, Glucose Profile)  REASON FOR ASSESSMENT:   Ventilator    ASSESSMENT:    Pt remains on vent, CRRT on hold due to throbocytopenia, currently off levophed and vasopressin  Diet Order:  Diet NPO time specified   EN: tolerating Vital 1.2 at rate of 30 ml/hr, Prostat supplementation  Skin:  Reviewed, no issues  Digestive system: no BM, no signs of TF intolerance  Electrolyte and Renal Profile:  Recent Labs Lab 10/21/15 1755 10/22/15 0503 10/22/15 1323 10/22/15 2053 10/23/15 0449  BUN 34* 35*  34* 41* 49* 60*  CREATININE 1.59* 1.34*  1.34* 1.72* 2.06* 2.48*  NA 136 137  137 137 139 138  K 4.2 4.0  4.0 3.9 3.7 3.6  MG 1.6* 1.6*  --   --  2.2  PHOS 2.3* 1.9* 1.7* 2.8 2.8   Glucose Profile:  Recent Labs  10/23/15 0611 10/23/15 0719 10/23/15 1136  GLUCAP 230* 207* 230*   Nutritional Anemia Profile:  CBC Latest Ref Rng 10/23/2015 10/22/2015 10/22/2015  WBC 3.6 - 11.0 K/uL 23.4(H) 18.8(H) 19.7(H)  Hemoglobin 12.0 - 16.0 g/dL 12.1 12.3 12.1  Hematocrit 35.0 - 47.0 % 36.1 36.8 37.0  Platelets 150 - 440 K/uL 10(LL) 14(LL) 12(LL)   Meds: ss novolog, precedex, diprivan  Height:   Ht Readings from Last 1 Encounters:  10/20/15 '5\' 7"'$  (1.702 m)    Weight:   Wt Readings from Last 1 Encounters:  10/23/15 285 lb 11.5 oz (129.6 kg)    BMI:  Body mass index is 44.74 kg/(m^2).  Estimated Nutritional Needs:   Kcal:  1287-8676 kcals (11-14 kcals/kg) using wt of 122.9 kg  Protein:  122-153 g (2.0-2.5 g/kg) using  IBW 61 kg  Fluid:  1525-1830 mL (25-30 ml/kg)   EDUCATION NEEDS:   No education needs identified at this time  [place], RD, LDN [phone] Pager  [phone] Weekend/On-Call Pager

## 2015-10-23 NOTE — Progress Notes (Signed)
SUBJECTIVE:   She converted to normal sinus rhythm yesterday evening. She is off norepinephrine drip and maintaining reasonable blood pressure.   Filed Vitals:   10/23/15 0800 10/23/15 0900 10/23/15 1000 10/23/15 1100  BP: 107/69 105/63 123/68 102/60  Pulse: 73 71 81 72  Temp: 101.1 F (38.4 C) 100.9 F (38.3 C) 100.6 F (38.1 C) 99.7 F (37.6 C)  TempSrc:      Resp: '30 30 30 30  '$ Height:      Weight:      SpO2: 99% 100% 99% 99%    Intake/Output Summary (Last 24 hours) at 10/23/15 1342 Last data filed at 10/23/15 0950  Gross per 24 hour  Intake 3593.76 ml  Output    100 ml  Net 3493.76 ml    LABS: Basic Metabolic Panel:  Recent Labs  10/22/15 0503  10/22/15 2053 10/23/15 0449  NA 137  137  < > 139 138  K 4.0  4.0  < > 3.7 3.6  CL 101  102  < > 100* 100*  CO2 26  24  < > 26 25  GLUCOSE 364*  356*  < > 199* 246*  BUN 35*  34*  < > 49* 60*  CREATININE 1.34*  1.34*  < > 2.06* 2.48*  CALCIUM 7.7*  7.6*  < > 7.8* 7.7*  MG 1.6*  --   --  2.2  PHOS 1.9*  < > 2.8 2.8  < > = values in this interval not displayed. Liver Function Tests:  Recent Labs  10/21/15 0515  10/22/15 0503  10/22/15 2053 10/23/15 0449  AST 312*  --  428*  --   --   --   ALT 176*  --  276*  --   --   --   ALKPHOS 391*  --  209*  --   --   --   BILITOT 3.1*  --  4.8*  --   --   --   PROT 5.3*  --  4.9*  --   --   --   ALBUMIN 2.1*  < > 1.8*  1.8*  < > 1.8* 1.7*  < > = values in this interval not displayed. No results for input(s): LIPASE, AMYLASE in the last 72 hours. CBC:  Recent Labs  10/22/15 1323 10/23/15 0911  WBC 18.8* 23.4*  HGB 12.3 12.1  HCT 36.8 36.1  MCV 89.3 88.2  PLT 14* 10*   Cardiac Enzymes:  Recent Labs  10/20/15 2242 10/21/15 1755 10/22/15 0106 10/22/15 0503  CKTOTAL 779*  --   --  162  TROPONINI 1.08* 0.44* 0.27*  --    BNP: Invalid input(s): POCBNP D-Dimer: No results for input(s): DDIMER in the last 72 hours. Hemoglobin A1C:  Recent  Labs  10/21/15 1756  HGBA1C 7.7*   Fasting Lipid Panel:  Recent Labs  10/22/15 0503  CHOL 108  HDL <5*  LDLCALC UNABLE TO CALCULATE IF TRIGLYCERIDE OVER 400 mg/dL  TRIG 458*  CHOLHDL UNABLE TO CALCULATE IF TRIGLYCERIDE OVER 400 mg/dL   Thyroid Function Tests: No results for input(s): TSH, T4TOTAL, T3FREE, THYROIDAB in the last 72 hours.  Invalid input(s): FREET3 Anemia Panel: No results for input(s): VITAMINB12, FOLATE, FERRITIN, TIBC, IRON, RETICCTPCT in the last 72 hours.   PHYSICAL EXAM General: Well developed, well nourished, she is intubated and seems to be restless. HEENT:  Normocephalic and atramatic Neck:  No JVD.  Lungs: Clear bilaterally to auscultation and percussion. Heart: HRRR . Normal  S1 and S2 without gallops or murmurs.  Abdomen: Bowel sounds are positive, abdomen soft and non-tender  Msk:  Back normal, normal gait. Normal strength and tone for age. Extremities: No clubbing, cyanosis or edema.   Neuro: Not able to assess as she is intubated Psych:  Not able to assess.  TELEMETRY: Reviewed telemetry pt in normal sinus rhythm.  ASSESSMENT AND PLAN:  1. Atrial fibrillation with rapid ventricular response in the setting of septic shock: She converted to normal sinus rhythm on IV amiodarone. Echocardiogram showed normal LV systolic function and normal atrial size. I switched amiodarone to 400 mg by mouth twice daily. This can be continued for another week then switched to 200 mg once daily. I doubt that she will require this long-term. Anticoagulation is contraindicated due to severe thrombocytopenia and this might not be indicated anyway if she has no recurrent episodes of atrial fibrillation.  2. Severe septic shock: Complicated by multiorgan failure. This seems to have improved and she is off pressors at the present time.  3. Elevated troponin: Likely due to supply demand ischemia. Echo showed normal LV systolic function.  Kathlyn Sacramento, MD,  [facility] 10/23/2015 1:42 PM

## 2015-10-23 NOTE — Progress Notes (Signed)
[place] [place] NAME: April [name]    MR#:  [id number]  DATE OF BIRTH:  August 06, 1946  SUBJECTIVE:  Remains intubated and on the vent Day 4, severely ill On CRRT- held y'day On IV fentanyl gtt -IV rocephin - continues with fever TF tolerated -sugars high, A1c 7.7 -developed hypoglycemia requiring IV dextrose On vasopressin,solumederol,levophed and amiodarone REVIEW OF SYSTEMS:   Review of Systems  Unable to perform ROS: intubated   Tolerating Diet:TF Tolerating PT: intubated  DRUG ALLERGIES:   Allergies  Allergen Reactions  . Prednisone Anaphylaxis    VITALS:  Blood pressure 120/80, pulse 75, temperature 101.7 F (38.7 C), temperature source Core (Comment), resp. rate 30, height '5\' 7"'$  (1.702 m), weight 129.6 kg (285 lb 11.5 oz), SpO2 98 %.  PHYSICAL EXAMINATION:   Physical Exam  GENERAL:  70 y.o.-year-old patient lying in the bed critically ill, intubated and on the vent no acute distress. Obese EYES: Pupils equal, round, reactive to light and accommodation. ++ scleral icterus. Extraocular muscles intact.  HEENT: Head atraumatic, normocephalic. Oropharynx and nasopharynx clear. intubated and on the vent NECK:  Supple, no jugular venous distention. No thyroid enlargement, no tenderness.  LUNGS: distant breath sounds bilaterally, no wheezing, rales, rhonchi. No use of accessory muscles of respiration.  CARDIOVASCULAR: S1, S2 normal. No murmurs, rubs, or gallops.  ABDOMEN: Soft, nontender, nondistended. Bowel sounds present. No organomegaly or mass. Foley+ EXTREMITIES: severe bilateral mottling, feet now warm to touch. Peripheral pulses dopplerable per RN NEUROLOGIC: on the vent PSYCHIATRIC: on the vent SKIN: severe bilateral LE mottling++  LABORATORY PANEL:  CBC  Recent Labs Lab 10/22/15 1323  WBC 18.8*  HGB 12.3  HCT 36.8  PLT 14*    Chemistries   Recent Labs Lab 10/22/15 0503  10/23/15 0449  NA 137   137  < > 138  K 4.0  4.0  < > 3.6  CL 101  102  < > 100*  CO2 26  24  < > 25  GLUCOSE 364*  356*  < > 246*  BUN 35*  34*  < > 60*  CREATININE 1.34*  1.34*  < > 2.48*  CALCIUM 7.7*  7.6*  < > 7.7*  MG 1.6*  --  2.2  AST 428*  --   --   ALT 276*  --   --   ALKPHOS 209*  --   --   BILITOT 4.8*  --   --   < > = values in this interval not displayed. Cardiac Enzymes  Recent Labs Lab 10/22/15 0106  TROPONINI 0.27*   RADIOLOGY:  Dg Abd 1 View  10/22/2015  CLINICAL DATA:  Abdominal distention. EXAM: ABDOMEN - 1 VIEW COMPARISON:  Radiograph 1257, CT 10/20/2015 FINDINGS: Exam is limited due to patient body habitus. NG tube extends to gastric antral region. No dilated loops of large or small bowel. Gas in the rectum. IMPRESSION: 1. NG tube in stomach. 2. No evidence of bowel obstruction. Electronically Signed   By: Suzy Bouchard M.D.   On: 10/22/2015 15:53   US Renal  10/21/2015  CLINICAL DATA:  Acute renal failure. EXAM: RENAL / URINARY TRACT ULTRASOUND COMPLETE COMPARISON:  CT 10/20/2015. FINDINGS: Right Kidney: Length: 12.3 cm. Echogenicity within normal limits. No mass. Mild right hydronephrosis. Left Kidney: Length: 13.0 cm. Echogenicity within normal limits. No mass or hydronephrosis visualized. Bladder: Foley catheter in bladder. Bladder decompressed. Limited exam due to body habitus . IMPRESSION: 1.  Mild right hydronephrosis.  Similar finding noted on prior CT. 2. Foley catheter in bladder.  Bladder decompressed. Electronically Signed   By: Marcello Moores  Register   On: 10/21/2015 10:09   Dg Chest Port 1 View  10/21/2015  CLINICAL DATA:  Acute respiratory failure. On ventilator. Acute renal failure. Non ST elevated myocardial infarct. Sepsis. EXAM: PORTABLE CHEST 1 VIEW COMPARISON:  10/20/2015 FINDINGS: New left internal jugular central venous catheter is seen with tip overlying the superior cavoatrial junction. No pneumothorax visualized. Right jugular central venous catheter remains  in appropriate position as well as endotracheal tube and nasogastric tube. Patient is partially rotated to the right. Mild cardiomegaly and probable mild interstitial edema shows no significant change. Bibasilar atelectasis and probable small left pleural effusion are also stable. IMPRESSION: New left internal jugular central venous catheter in appropriate position. No pneumothorax visualized. No significant change in cardiomegaly, probable interstitial edema, bibasilar atelectasis, and probable small left pleural effusion. Electronically Signed   By: Earle Gell M.D.   On: 10/21/2015 16:48   ASSESSMENT AND PLAN:   70 year old female presented to the emergency room after she was found lying on the floor with minimal responsiveness.  1. Severe Septic shock, hypovolemic shock: due to proteus, source urine  -Patient's currently intubated on ventilator Day 4 -Patient is on levopeded and vasopressin. - Continue IV Rocephin. -Stress dose steroids -  blood and urine culture growing Proteus Mirabilis  2. Acute renal failure: - is due to ATN in the setting of sepsis and rhabdomyolysis. Patient was anuric.  -Patient is  undergoing emergent CRRT---which was held for 24 hours -CT scan shows right-sided perinephric edema. -Appreciate nephrology consultation.  3. Severe leukocytosis: This is due to usepsis and also likely due to dehydration/anuria. White blood cell has improved. 50---47--38--19K--18 -continues with fever  4. Atrial fibrillation with RVR:  -On IV amiodarone drip. Patient has elevated troponins which may be due to demand ischemia from acute issues - Continue amiodarone drip.  -Follow-up on echocardiogram  5. DM-2 , new onset -HbA1c of 7.7 -will hold Insulin gtt due to hypoglycemia and requiring IV dextrose -consider insulin aspart 5 units qid unless sugars climb up and need to resume Insulin gtt  6. Elevated transaminases and jaundice -suspect Shock liver  7. Acute  rhabdomyolysis secondary to patient being found down on the floor:  -Continue to trend CPK -IVF  8. Severe TCP came in with 62---46--23--12--14 likely consumption pf plts given severe sepsis ,?DIC No antiplt agents  -no active bleeding  CODE STATUS: full  DVT Prophylaxis: SCD/TED  TOTAL critical TIME TAKING CARE OF THIS PATIENT: 40 minutes.  >50% time spent on counselling and coordination of care RN Note: This dictation was prepared with Dragon dictation along with smaller phrase technology. Any transcriptional errors that result from this process are unintentional.  Baron Parmelee M.D on 10/23/2015 at 7:12 AM  Between 7am to 6pm - Pager - [phone]  After 6pm go to [email or web address] - password [email or web address] [facility]  Office  [phone]  CC: Primary care physician; Albina Billet, MD

## 2015-10-23 NOTE — Progress Notes (Signed)
Bill came in to visit Mrs Lins. Wanted an update and asked why CRRT was not running. I stated that I would have  Dr Mortimer Fries to come speak to him since patient had password and [facility] had it. Per Dr Mortimer Fries he will speak to uncle who is next to kin only and that he may update others. I let Bill Know at this point Dr Mortimer Fries is unable to update him and that he would need to contact patients uncle. [facility] is concerned due to uncles age and mental status. I did speak to the patients cousin and he will be coming in today with uncle.

## 2015-10-23 NOTE — Progress Notes (Signed)
PULMONARY / CRITICAL CARE MEDICINE   Name: April [name] MRN: [id number] DOB: 08-10-1946    ADMISSION DATE:  10/20/2015  Consulting physician - Dr. Fritzi Mandes  BRIEF HISTORY: 70 y.o. female with unknown past medical history was picked up by EMS today after being found at her house laying on the floor minimally responsive. Per coworkers she has not been to work for 2 days and when they checked on her they found her lying in the floor minimally responsive. EMS was called. Upon arrival to the ED, she only withdrew to pain, due to low GCS and inability to protect her airway she was intubated.   SUBJECTIVE:  Patient weaned off pressors, had moderate agitation this morning, fentanyl had to be increased. Platelets still significantly low, spoke with nephrology, CRRT hold for another 24 hours. Still with fevers overnight.  STUDIES:  1/24 CT head and cspine - no acute findings 1/25 CT A/P - RIGHT hydronephrosis question UPJ obstruction; in the setting of leukocytosis, recommend correlation with urinalysis to exclude urinary tract infection.  SIGNIFICANT EVENTS: 1/24>>found down, brought to [facility] ED, intubated and started on CRRT 1/24>>RIJ Vascath 1/25>>LIJ CVL, placed by Vascular (Dr. Lucky Cowboy).   VITAL SIGNS: Temp:  [99.7 F (37.6 C)-102 F (38.9 C)] 99.7 F (37.6 C) (01/27 1100) Pulse Rate:  [70-89] 72 (01/27 1100) Resp:  [12-30] 30 (01/27 1100) BP: (85-143)/(53-80) 102/60 mmHg (01/27 1100) SpO2:  [89 %-100 %] 99 % (01/27 1100) FiO2 (%):  [30 %-40 %] 30 % (01/27 1154) Weight:  [285 lb 11.5 oz (129.6 kg)] 285 lb 11.5 oz (129.6 kg) (01/27 0500) HEMODYNAMICS:   VENTILATOR SETTINGS: Vent Mode:  [-] PRVC FiO2 (%):  [30 %-40 %] 30 % Set Rate:  [30 bmp] 30 bmp Vt Set:  [500 mL] 500 mL PEEP:  [5 cmH20] 5 cmH20 Plateau Pressure:  [23 cmH20-26 cmH20] 23 cmH20 INTAKE / OUTPUT:  Intake/Output Summary (Last 24 hours) at 10/23/15 1512 Last data filed at 10/23/15 0950  Gross per 24 hour  Intake  3593.76 ml  Output    100 ml  Net 3493.76 ml    Review of Systems  Unable to perform ROS: intubated    Physical Exam  Constitutional: She appears distressed.  HENT:  Head: Normocephalic and atraumatic.  Right Ear: External ear normal.  Left Ear: External ear normal.  Eyes: EOM are normal. Pupils are equal, round, and reactive to light.  Neck: Normal range of motion. No JVD present. No tracheal deviation present. No thyromegaly present.  Cardiovascular: Normal rate, regular rhythm and normal heart sounds.   No murmur heard. Pulmonary/Chest: She has no wheezes. She has no rales.  Intubated, coarse upper airway sounds  Abdominal: Soft. Bowel sounds are normal. She exhibits no distension.  Musculoskeletal: She exhibits edema. She exhibits no tenderness.  Neurological:  Intubated and sedated Withdraws from pain  Skin: Skin is dry.  Skin mottling has improved, now limited b/l legs, legs warm today  Nursing note and vitals reviewed.    LABS:  CBC  Recent Labs Lab 10/22/15 0503 10/22/15 1323 10/23/15 0911  WBC 19.7* 18.8* 23.4*  HGB 12.1 12.3 12.1  HCT 37.0 36.8 36.1  PLT 12* 14* 10*   Coag's  Recent Labs Lab 10/20/15 0957 10/21/15 0023  APTT 35  --   INR 1.46 1.52   BMET  Recent Labs Lab 10/22/15 1323 10/22/15 2053 10/23/15 0449  NA 137 139 138  K 3.9 3.7 3.6  CL 102 100* 100*  CO2  $'27 26 25  'b$ BUN 41* 49* 60*  CREATININE 1.72* 2.06* 2.48*  GLUCOSE 314* 199* 246*   Electrolytes  Recent Labs Lab 10/21/15 1755 10/22/15 0503 10/22/15 1323 10/22/15 2053 10/23/15 0449  CALCIUM 7.6* 7.7*  7.6* 7.9* 7.8* 7.7*  MG 1.6* 1.6*  --   --  2.2  PHOS 2.3* 1.9* 1.7* 2.8 2.8   Sepsis Markers  Recent Labs Lab 10/20/15 1254 10/21/15 0515 10/21/15 1753  LATICACIDVEN 5.9* 3.7* 2.9*   ABG  Recent Labs Lab 10/20/15 2035 10/21/15 0847  PHART 7.13* 7.35  PCO2ART 41 33  PO2ART 103 193*   Liver Enzymes  Recent Labs Lab 10/20/15 0957 10/21/15 0515   10/22/15 0503 10/22/15 1323 10/22/15 2053 10/23/15 0449  AST 124* 312*  --  428*  --   --   --   ALT 63* 176*  --  276*  --   --   --   ALKPHOS 567* 391*  --  209*  --   --   --   BILITOT 2.5* 3.1*  --  4.8*  --   --   --   ALBUMIN 3.1* 2.1*  < > 1.8*  1.8* 1.8* 1.8* 1.7*  < > = values in this interval not displayed. Cardiac Enzymes  Recent Labs Lab 10/20/15 2242 10/21/15 1755 10/22/15 0106  TROPONINI 1.08* 0.44* 0.27*   Glucose  Recent Labs Lab 10/23/15 0317 10/23/15 0440 10/23/15 0501 10/23/15 0611 10/23/15 0719 10/23/15 1136  GLUCAP 84 61* 140* 230* 207* 230*    Imaging Dg Abd 1 View  10/22/2015  CLINICAL DATA:  Abdominal distention. EXAM: ABDOMEN - 1 VIEW COMPARISON:  Radiograph 1257, CT 10/20/2015 FINDINGS: Exam is limited due to patient body habitus. NG tube extends to gastric antral region. No dilated loops of large or small bowel. Gas in the rectum. IMPRESSION: 1. NG tube in stomach. 2. No evidence of bowel obstruction. Electronically Signed   By: Suzy Bouchard M.D.   On: 10/22/2015 15:53   Dg Chest Port 1 View  10/23/2015  CLINICAL DATA:  Respiratory difficulty EXAM: PORTABLE CHEST 1 VIEW COMPARISON:  10/21/2015 FINDINGS: Endotracheal and NG tubes are stable. Left jugular venous catheter with its tip in the upper SVC is stable. Right jugular dialysis catheter with its tip in the upper SVC is stable. Lungs remain under aerated with central and basilar atelectasis. Mild vascular congestion is stable. No pneumothorax. IMPRESSION: Stable tubular devices Stable vascular congestion and bibasilar atelectasis. Electronically Signed   By: Marybelle Killings M.D.   On: 10/23/2015 12:42    Cultures: BCx2 x2 1/24>>Proteus UC 1/24>>Proteus Sputum 1/26>> BC x 2 1/26>> UC 1/26>>  Antibiotics: Vanc 1/24>>1/25 Zosyn 1/24>>1/25 Ceftriaxone 1/25>>1/27 Meropenem   Lines: RIJ Vascath 1/24>> LIJ CVL 1/25>>  ASSESSMENT / PLAN: 70 yo Female with no sig PMHX, found down  and unresponsive, intubated in the ED for respiratory distress, found to have leukocytosis, NSTEMI, UA concerning for infection, respiratory failure, septic shock.   PULMONARY OETT 7.13m Respiratory failure - hypoxic Septic shock - improving P:  - cont with MV - prn ABG - wean MV as tolerated - maintain sats >90% - WUA\SBT daily - wean fentanyl, start precedex gtt.   CARDIOVASCULAR CVL - RIJ VASCATH, LIJ CVL Shock - septic hypotension NSTEMI Afib - started on amiodarone 1/24  P:  - maintain MAP >65 - vasopressors as needed - currently weaned off.  - monitor BP - trend CE - heparin gtt on hold due to low plt -  mostly likely demand ischemia - EKG reviewed, no sig ST changes.  - cont with amiodarone - cardiology consult appreciated.   RENAL Anuria ARF UTI ?Rhabdomyolysis - CK improving, unlikely rhabdo Right hydronephrosis Proteus Bacteremia, Proteus UTI P:  -monitor Cr - crrt - appreciate nephro recs.  - follow up Ur Cx -current CK trending down, cont to follow, IVF -Etoh, salicylate levels normal  - U/S of Right Kidney with mild R hydro, WBC improving, pressors are being wean down - CT A/P was done prior to foley placement, bladder is now decompressed.  - fevers overnight, will broaden abx by changing to meropenem in this severe septic patient.   GASTROINTESTINAL SUP - PPI Elevated LFTs - trend TF  HEMATOLOGIC Leukocytosis Thrombocytopenia - lower today P:  -most likely related to septic shock - source urine, now with Proteus Bacteremia -follow CBC -cont abx -stopped heparin 1/24 -CRRT on hold for 24 hrs again, reevaluate on 1/28 AM  INFECTIOUS Sepsis - Proteus UTI and Bacteremia Still spiking fevers on antibiotics  P:  - cont with current abx - Bld and UC Cx with Protues, narrowed abx to ceftriaxone on 1/25, but spiked fevers overnight, repeat cultures.  - broaden abx to meropenem on 1/27  ENDOCRINE ICU hypo/hyperglycemia  protocol  NEUROLOGIC A: Acute encephalopathy P:  RASS goal: -1 - metabolic encephalopathy - cont to monitor neuro status.    Thank you for consulting [facility], Please feel free to contacts [place] with any questions at [phone] (please enter 7-digits).  I have personally obtained a history, examined the patient, evaluated laboratory and imaging results, formulated the assessment and plan and placed orders.  The Patient requires high complexity decision making for assessment and support, frequent evaluation and titration of therapies, application of advanced monitoring technologies and extensive interpretation of multiple databases. Critical Care Time devoted to patient care services described in this note is 45 minutes.   Overall, patient is critically ill, prognosis is guarded. Patient at high risk for cardiac arrest and death.   Vilinda Boehringer, MD [facility] Pager [phone] (please enter 7-digits) On Call Pager [phone] (please enter 7-digits)  Note: This note was prepared with Dragon dictation along with smaller phrase technology. Any transcriptional errors that result from this process are unintentional.

## 2015-10-23 NOTE — Care Management CHF Note (Signed)
CRRT. Remains on vasopressors and full ventilator support

## 2015-10-23 NOTE — Progress Notes (Signed)
Spoke with Dr Stevenson Clinch aware of patients platelets, WBC and patients fever.

## 2015-10-23 NOTE — Progress Notes (Signed)
ANTIBIOTIC CONSULT NOTE - INITIAL  Pharmacy Consult for Meropenem Indication: Sepsi  Allergies  Allergen Reactions  . Prednisone Anaphylaxis    Patient Measurements: Height: '5\' 7"'$  (170.2 cm) Weight: 285 lb 11.5 oz (129.6 kg) IBW/kg (Calculated) : 61.6   Vital Signs: Temp: 99.7 F (37.6 C) (01/27 1100) Temp Source: Core (Comment) (01/27 0400) BP: 102/60 mmHg (01/27 1100) Pulse Rate: 72 (01/27 1100) Intake/Output from previous day: 01/26 0701 - 01/27 0700 In: 3593.8 [I.V.:2700.9; NG/GT:892.8] Out: 206 [Urine:100] Intake/Output from this shift: Total I/O In: -  Out: 25 [Urine:25]  Labs:  Recent Labs  10/22/15 0503 10/22/15 1323 10/22/15 2053 10/23/15 0449 10/23/15 0911  WBC 19.7* 18.8*  --   --  23.4*  HGB 12.1 12.3  --   --  12.1  PLT 12* 14*  --   --  10*  CREATININE 1.34*  1.34* 1.72* 2.06* 2.48*  --    Estimated Creatinine Clearance: 30 mL/min (by C-G formula based on Cr of 2.48). No results for input(s): VANCOTROUGH, VANCOPEAK, VANCORANDOM, GENTTROUGH, GENTPEAK, GENTRANDOM, TOBRATROUGH, TOBRAPEAK, TOBRARND, AMIKACINPEAK, AMIKACINTROU, AMIKACIN in the last 72 hours.   Microbiology: Recent Results (from the past 720 hour(s))  Urine culture     Status: None   Collection Time: 10/20/15  9:57 AM  Result Value Ref Range Status   Specimen Description URINE, RANDOM  Final   Special Requests NONE  Final   Culture >=100,000 COLONIES/mL PROTEUS MIRABILIS  Final   Report Status 10/22/2015 FINAL  Final   Organism ID, Bacteria PROTEUS MIRABILIS  Final      Susceptibility   Proteus mirabilis - MIC*    AMPICILLIN <=2 SENSITIVE Sensitive     GENTAMICIN <=1 SENSITIVE Sensitive     CEFTRIAXONE Value in next row Sensitive      SENSITIVE<=1    CIPROFLOXACIN Value in next row Sensitive      SENSITIVE<=0.25    IMIPENEM Value in next row Sensitive      SENSITIVE2    NITROFURANTOIN Value in next row Resistant      RESISTANT128    TRIMETH/SULFA Value in next row  Sensitive      SENSITIVE<=20    PIP/TAZO Value in next row Sensitive      SENSITIVE<=4    AMPICILLIN/SULBACTAM Value in next row Sensitive      SENSITIVE<=2    * >=100,000 COLONIES/mL PROTEUS MIRABILIS  Culture, blood (routine x 2)     Status: None   Collection Time: 10/20/15  9:57 AM  Result Value Ref Range Status   Specimen Description BLOOD RIGHT WRIST  Final   Special Requests   Final    BOTTLES DRAWN AEROBIC AND ANAEROBIC ANA 1ML AER 4ML   Culture  Setup Time   Final    GRAM NEGATIVE RODS IN BOTH AEROBIC AND ANAEROBIC BOTTLES CRITICAL RESULT CALLED TO, READ BACK BY AND VERIFIED WITH: MATT MCBANE ON 10/21/15 AT 0005 [facility] CONFIRMED BY [place]    Culture   Final    PROTEUS MIRABILIS IN BOTH AEROBIC AND ANAEROBIC BOTTLES    Report Status 10/22/2015 FINAL  Final   Organism ID, Bacteria PROTEUS MIRABILIS  Final      Susceptibility   Proteus mirabilis - MIC*    AMPICILLIN/SULBACTAM Value in next row Sensitive      SENSITIVE<=2    PIP/TAZO Value in next row Sensitive      SENSITIVE<=4    CEFTRIAXONE Value in next row Sensitive      SENSITIVE<=1  IMIPENEM Value in next row Sensitive      SENSITIVE2    GENTAMICIN Value in next row Sensitive      SENSITIVE<=1    CIPROFLOXACIN Value in next row Sensitive      SENSITIVE<=0.25    * PROTEUS MIRABILIS  Culture, blood (routine x 2)     Status: None   Collection Time: 10/20/15  9:57 AM  Result Value Ref Range Status   Specimen Description BLOOD LEFT HAND  Final   Special Requests   Final    BOTTLES DRAWN AEROBIC AND ANAEROBIC AER 3ML ANA 2ML   Culture  Setup Time   Final    GRAM NEGATIVE RODS IN BOTH AEROBIC AND ANAEROBIC BOTTLES CRITICAL VALUE NOTED.  VALUE IS CONSISTENT WITH PREVIOUSLY REPORTED AND CALLED VALUE.    Culture   Final    PROTEUS MIRABILIS IN BOTH AEROBIC AND ANAEROBIC BOTTLES    Report Status 10/22/2015 FINAL  Final   Organism ID, Bacteria PROTEUS MIRABILIS  Final      Susceptibility   Proteus mirabilis - MIC*     AMPICILLIN Value in next row Sensitive      SENSITIVE<=2    PIP/TAZO Value in next row Sensitive      SENSITIVE<=4    CEFTAZIDIME Value in next row Sensitive      SENSITIVE<=1    CEFTRIAXONE Value in next row Sensitive      SENSITIVE<=1    CEFEPIME Value in next row Sensitive      SENSITIVE<=1    IMIPENEM Value in next row Sensitive      SENSITIVE2    GENTAMICIN Value in next row Sensitive      SENSITIVE<=1    CIPROFLOXACIN Value in next row Sensitive      SENSITIVE<=0.25    * PROTEUS MIRABILIS  Blood Culture ID Panel (Reflexed)     Status: Abnormal   Collection Time: 10/20/15  9:57 AM  Result Value Ref Range Status   Enterococcus species NOT DETECTED NOT DETECTED Final   Listeria monocytogenes NOT DETECTED NOT DETECTED Final   Staphylococcus species NOT DETECTED NOT DETECTED Final   Staphylococcus aureus NOT DETECTED NOT DETECTED Final   Streptococcus species NOT DETECTED NOT DETECTED Final   Streptococcus agalactiae NOT DETECTED NOT DETECTED Final   Streptococcus pneumoniae NOT DETECTED NOT DETECTED Final   Streptococcus pyogenes NOT DETECTED NOT DETECTED Final   Acinetobacter baumannii NOT DETECTED NOT DETECTED Final   Enterobacteriaceae species DETECTED (A) NOT DETECTED Final    Comment: CRITICAL RESULT CALLED TO, READ BACK BY AND VERIFIED WITH: MATT MCBANE ON 10/21/15 AT 0005 [place]    Enterobacter cloacae complex NOT DETECTED NOT DETECTED Final   Escherichia coli NOT DETECTED NOT DETECTED Final   Klebsiella oxytoca NOT DETECTED NOT DETECTED Final   Klebsiella pneumoniae NOT DETECTED NOT DETECTED Final   Proteus species (A) NOT DETECTED Final    CRITICAL RESULT CALLED TO, READ BACK BY AND VERIFIED WITH:    Comment: MATT MCBANE ON 10/21/15 AT 0005 [place]   Serratia marcescens NOT DETECTED NOT DETECTED Final   Haemophilus influenzae NOT DETECTED NOT DETECTED Final   Neisseria meningitidis NOT DETECTED NOT DETECTED Final   Pseudomonas aeruginosa NOT DETECTED NOT DETECTED  Final   Candida albicans NOT DETECTED NOT DETECTED Final   Candida glabrata NOT DETECTED NOT DETECTED Final   Candida krusei NOT DETECTED NOT DETECTED Final   Candida parapsilosis NOT DETECTED NOT DETECTED Final   Candida tropicalis NOT DETECTED NOT DETECTED Final  Carbapenem resistance NOT DETECTED NOT DETECTED Final   Methicillin resistance NOT DETECTED NOT DETECTED Final   Vancomycin resistance NOT DETECTED NOT DETECTED Final  MRSA PCR Screening     Status: None   Collection Time: 10/20/15  7:41 PM  Result Value Ref Range Status   MRSA by PCR NEGATIVE NEGATIVE Final    Comment:        The GeneXpert MRSA Assay (FDA approved for NASAL specimens only), is one component of a comprehensive MRSA colonization surveillance program. It is not intended to diagnose MRSA infection nor to guide or monitor treatment for MRSA infections.   CULTURE, BLOOD (ROUTINE X 2) w Reflex to PCR ID Panel     Status: None (Preliminary result)   Collection Time: 10/22/15 11:39 AM  Result Value Ref Range Status   Specimen Description BLOOD LEFT HAND  Final   Special Requests BOTTLES DRAWN AEROBIC AND ANAEROBIC 1CC  Final   Culture NO GROWTH < 24 HOURS  Final   Report Status PENDING  Incomplete  Culture, expectorated sputum-assessment     Status: None   Collection Time: 10/22/15 12:00 PM  Result Value Ref Range Status   Specimen Description SPUTUM  Final   Special Requests Normal  Final   Sputum evaluation THIS SPECIMEN IS ACCEPTABLE FOR SPUTUM CULTURE  Final   Report Status 10/22/2015 FINAL  Final  Culture, respiratory (NON-Expectorated)     Status: None (Preliminary result)   Collection Time: 10/22/15 12:00 PM  Result Value Ref Range Status   Specimen Description SPUTUM  Final   Special Requests Normal Reflexed from [id number]  Final   Gram Stain PENDING  Incomplete   Culture   Final    LIGHT GROWTH GRAM NEGATIVE RODS IDENTIFICATION AND SUSCEPTIBILITIES TO FOLLOW    Report Status PENDING   Incomplete  Urine culture     Status: None (Preliminary result)   Collection Time: 10/22/15 12:45 PM  Result Value Ref Range Status   Specimen Description URINE, RANDOM  Final   Special Requests NONE  Final   Culture NO GROWTH < 24 HOURS  Final   Report Status PENDING  Incomplete    Medical History: History reviewed. No pertinent past medical history.  Medications:  Scheduled:  . antiseptic oral rinse  7 mL Mouth Rinse 6 times per day  . chlorhexidine gluconate  15 mL Mouth/Throat BID  . feeding supplement (PRO-STAT SUGAR FREE 64)  30 mL Per Tube TID  . free water  100 mL Per Tube 3 times per day  . hydrocortisone sod succinate (SOLU-CORTEF) inj  50 mg Intravenous Q6H  . insulin aspart  0-15 Units Subcutaneous 6 times per day  . insulin aspart  5 Units Subcutaneous Q6H  . meropenem (MERREM) IV  1 g Intravenous Q12H  . pantoprazole (PROTONIX) IV  40 mg Intravenous QHS  . senna-docusate  1 tablet Oral BID   Infusions:  . amiodarone 60 mg/hr (10/23/15 0654)  . dexmedetomidine    . feeding supplement (VITAL AF 1.2 CAL) 1,000 mL (10/23/15 0100)  . fentaNYL infusion INTRAVENOUS 250 mcg/hr (10/23/15 1030)  . norepinephrine (LEVOPHED) Adult infusion Stopped (10/23/15 0920)  . pureflow 2,000 mL/hr at 10/22/15 0104  . vasopressin (PITRESSIN) infusion - *FOR SHOCK* Stopped (10/23/15 6295)   Assessment: 70 y/o F with septic shock and blood and urine cultures growing Proteus. Patient on ceftriaxone and continues to spike fevers, so MD wants to change antibiotics to meropenem. Patient is currently off CRRT until platelets improve.  Plan:  Will begin meropenem 1 g iv q 12 hours.  Ulice Dash D 10/23/2015,12:54 PM

## 2015-10-23 NOTE — Progress Notes (Signed)
Paged Dr Mortimer Fries three times. Uncle, cousin and Juliann Pulse (co-worker that found patient unresponsive) at patients bedside. Uncle states that he has not been contacted in the past 48 hours. He is stating that he gave permission and password to cathy to speak with physicians regarding patients care. Co-worker unable to get information this afternoon from Dr Mortimer Fries because he was directing all updates and questions to one person which he said was the uncle. Called [facility] left message with nurse regarding situation. Dr Posey Pronto has left for the day.

## 2015-10-23 NOTE — Progress Notes (Signed)
Did not get BMP per [place] CRRT on hold, one scheduled 1/28 am

## 2015-10-23 NOTE — Progress Notes (Signed)
Inpatient Diabetes Program Recommendations  AACE/ADA: New Consensus Statement on Inpatient Glycemic Control (2015)  Target Ranges:  Prepandial:   less than 140 mg/dL      Peak postprandial:   less than 180 mg/dL (1-2 hours)      Critically ill patients:  140 - 180 mg/dL   Review of Glycemic Control  Results for April [name], April [name] (MRN [id number]) as of 10/23/2015 09:45  Ref. Range 10/23/2015 03:17 10/23/2015 04:40 10/23/2015 05:01 10/23/2015 06:11 10/23/2015 07:19  Glucose-Capillary Latest Ref Range: 65-99 mg/dL 84 61 (L) 140 (H) 230 (H) 207 (H)    Diabetes history: A1C 7.7% Outpatient Diabetes medications: unknown Current orders for Inpatient glycemic control: Novolog 5 units q6h  Inpatient Diabetes Program Recommendations: Patient transitioned to Novolog 5 units q6h.  May need to consider basal insulin if bg continue to trend upward.  Will follow.   Gentry Fitz, RN, BA, MHA, CDE Diabetes Coordinator Inpatient Diabetes Program  [phone] (Team Pager) [phone] ([place]) 10/23/2015 9:53 AM

## 2015-10-23 NOTE — Progress Notes (Signed)
[facility] NOTE  Pharmacy Consult for CRRT medication management, Meropenem Dosing, Electrolyte Dosing, Constipation Prevention Indication: CRRT/ Proteus Bacteremia     Allergies  Allergen Reactions  . Prednisone Anaphylaxis    Patient Measurements: Height: '5\' 7"'$  (170.2 cm) Weight: 285 lb 11.5 oz (129.6 kg) IBW/kg (Calculated) : 61.6   Vital Signs: Temp: 99.7 F (37.6 C) (01/27 1100) Temp Source: Core (Comment) (01/27 0400) BP: 102/60 mmHg (01/27 1100) Pulse Rate: 72 (01/27 1100) Intake/Output from previous day: 01/26 0701 - 01/27 0700 In: 3593.8 [I.V.:2700.9; NG/GT:892.8] Out: 206 [Urine:100] Intake/Output from this shift: Total I/O In: -  Out: 25 [Urine:25] Vent settings for last 24 hours: Vent Mode:  [-] PRVC FiO2 (%):  [30 %-40 %] 30 % Set Rate:  [30 bmp] 30 bmp Vt Set:  [500 mL] 500 mL PEEP:  [5 cmH20] 5 cmH20 Plateau Pressure:  [22 cmH20-26 cmH20] 22 cmH20  Labs:  Recent Labs  10/21/15 0023  10/21/15 0515 10/21/15 1755 10/22/15 0503 10/22/15 1323 10/22/15 2053 10/23/15 0449 10/23/15 0911  WBC  --   --  38.8*  --  19.7* 18.8*  --   --  23.4*  HGB  --   --  13.3  --  12.1 12.3  --   --  12.1  HCT  --   --  40.2  --  37.0 36.8  --   --  36.1  PLT  --   --  23*  --  12* 14*  --   --  10*  INR 1.52  --   --   --   --   --   --   --   --   CREATININE  --   < > 1.87* 1.59* 1.34*  1.34* 1.72* 2.06* 2.48*  --   MG  --   < > 1.5* 1.6* 1.6*  --   --  2.2  --   PHOS  --   < > 3.1 2.3* 1.9* 1.7* 2.8 2.8  --   ALBUMIN  --   < > 2.1* 1.9* 1.8*  1.8* 1.8* 1.8* 1.7*  --   PROT  --   --  5.3*  --  4.9*  --   --   --   --   AST  --   --  312*  --  428*  --   --   --   --   ALT  --   --  176*  --  276*  --   --   --   --   ALKPHOS  --   --  391*  --  209*  --   --   --   --   BILITOT  --   --  3.1*  --  4.8*  --   --   --   --   < > = values in this interval not displayed. Estimated Creatinine Clearance: 30 mL/min (by C-G formula based on Cr  of 2.48).   Recent Labs  10/23/15 0611 10/23/15 0719 10/23/15 1136  GLUCAP 230* 207* 230*    Microbiology: Recent Results (from the past 720 hour(s))  Urine culture     Status: None   Collection Time: 10/20/15  9:57 AM  Result Value Ref Range Status   Specimen Description URINE, RANDOM  Final   Special Requests NONE  Final   Culture >=100,000 COLONIES/mL PROTEUS MIRABILIS  Final   Report Status 10/22/2015 FINAL  Final  Organism ID, Bacteria PROTEUS MIRABILIS  Final      Susceptibility   Proteus mirabilis - MIC*    AMPICILLIN <=2 SENSITIVE Sensitive     GENTAMICIN <=1 SENSITIVE Sensitive     CEFTRIAXONE Value in next row Sensitive      SENSITIVE<=1    CIPROFLOXACIN Value in next row Sensitive      SENSITIVE<=0.25    IMIPENEM Value in next row Sensitive      SENSITIVE2    NITROFURANTOIN Value in next row Resistant      RESISTANT128    TRIMETH/SULFA Value in next row Sensitive      SENSITIVE<=20    PIP/TAZO Value in next row Sensitive      SENSITIVE<=4    AMPICILLIN/SULBACTAM Value in next row Sensitive      SENSITIVE<=2    * >=100,000 COLONIES/mL PROTEUS MIRABILIS  Culture, blood (routine x 2)     Status: None   Collection Time: 10/20/15  9:57 AM  Result Value Ref Range Status   Specimen Description BLOOD RIGHT WRIST  Final   Special Requests   Final    BOTTLES DRAWN AEROBIC AND ANAEROBIC ANA 1ML AER 4ML   Culture  Setup Time   Final    GRAM NEGATIVE RODS IN BOTH AEROBIC AND ANAEROBIC BOTTLES CRITICAL RESULT CALLED TO, READ BACK BY AND VERIFIED WITH: MATT MCBANE ON 10/21/15 AT 0005 [facility] CONFIRMED BY [place]    Culture   Final    PROTEUS MIRABILIS IN BOTH AEROBIC AND ANAEROBIC BOTTLES    Report Status 10/22/2015 FINAL  Final   Organism ID, Bacteria PROTEUS MIRABILIS  Final      Susceptibility   Proteus mirabilis - MIC*    AMPICILLIN/SULBACTAM Value in next row Sensitive      SENSITIVE<=2    PIP/TAZO Value in next row Sensitive      SENSITIVE<=4    CEFTRIAXONE  Value in next row Sensitive      SENSITIVE<=1    IMIPENEM Value in next row Sensitive      SENSITIVE2    GENTAMICIN Value in next row Sensitive      SENSITIVE<=1    CIPROFLOXACIN Value in next row Sensitive      SENSITIVE<=0.25    * PROTEUS MIRABILIS  Culture, blood (routine x 2)     Status: None   Collection Time: 10/20/15  9:57 AM  Result Value Ref Range Status   Specimen Description BLOOD LEFT HAND  Final   Special Requests   Final    BOTTLES DRAWN AEROBIC AND ANAEROBIC AER 3ML ANA 2ML   Culture  Setup Time   Final    GRAM NEGATIVE RODS IN BOTH AEROBIC AND ANAEROBIC BOTTLES CRITICAL VALUE NOTED.  VALUE IS CONSISTENT WITH PREVIOUSLY REPORTED AND CALLED VALUE.    Culture   Final    PROTEUS MIRABILIS IN BOTH AEROBIC AND ANAEROBIC BOTTLES    Report Status 10/22/2015 FINAL  Final   Organism ID, Bacteria PROTEUS MIRABILIS  Final      Susceptibility   Proteus mirabilis - MIC*    AMPICILLIN Value in next row Sensitive      SENSITIVE<=2    PIP/TAZO Value in next row Sensitive      SENSITIVE<=4    CEFTAZIDIME Value in next row Sensitive      SENSITIVE<=1    CEFTRIAXONE Value in next row Sensitive      SENSITIVE<=1    CEFEPIME Value in next row Sensitive      SENSITIVE<=1  IMIPENEM Value in next row Sensitive      SENSITIVE2    GENTAMICIN Value in next row Sensitive      SENSITIVE<=1    CIPROFLOXACIN Value in next row Sensitive      SENSITIVE<=0.25    * PROTEUS MIRABILIS  Blood Culture ID Panel (Reflexed)     Status: Abnormal   Collection Time: 10/20/15  9:57 AM  Result Value Ref Range Status   Enterococcus species NOT DETECTED NOT DETECTED Final   Listeria monocytogenes NOT DETECTED NOT DETECTED Final   Staphylococcus species NOT DETECTED NOT DETECTED Final   Staphylococcus aureus NOT DETECTED NOT DETECTED Final   Streptococcus species NOT DETECTED NOT DETECTED Final   Streptococcus agalactiae NOT DETECTED NOT DETECTED Final   Streptococcus pneumoniae NOT DETECTED NOT  DETECTED Final   Streptococcus pyogenes NOT DETECTED NOT DETECTED Final   Acinetobacter baumannii NOT DETECTED NOT DETECTED Final   Enterobacteriaceae species DETECTED (A) NOT DETECTED Final    Comment: CRITICAL RESULT CALLED TO, READ BACK BY AND VERIFIED WITH: MATT MCBANE ON 10/21/15 AT 0005 [place]    Enterobacter cloacae complex NOT DETECTED NOT DETECTED Final   Escherichia coli NOT DETECTED NOT DETECTED Final   Klebsiella oxytoca NOT DETECTED NOT DETECTED Final   Klebsiella pneumoniae NOT DETECTED NOT DETECTED Final   Proteus species (A) NOT DETECTED Final    CRITICAL RESULT CALLED TO, READ BACK BY AND VERIFIED WITH:    Comment: MATT MCBANE ON 10/21/15 AT 0005 [place]   Serratia marcescens NOT DETECTED NOT DETECTED Final   Haemophilus influenzae NOT DETECTED NOT DETECTED Final   Neisseria meningitidis NOT DETECTED NOT DETECTED Final   Pseudomonas aeruginosa NOT DETECTED NOT DETECTED Final   Candida albicans NOT DETECTED NOT DETECTED Final   Candida glabrata NOT DETECTED NOT DETECTED Final   Candida krusei NOT DETECTED NOT DETECTED Final   Candida parapsilosis NOT DETECTED NOT DETECTED Final   Candida tropicalis NOT DETECTED NOT DETECTED Final   Carbapenem resistance NOT DETECTED NOT DETECTED Final   Methicillin resistance NOT DETECTED NOT DETECTED Final   Vancomycin resistance NOT DETECTED NOT DETECTED Final  MRSA PCR Screening     Status: None   Collection Time: 10/20/15  7:41 PM  Result Value Ref Range Status   MRSA by PCR NEGATIVE NEGATIVE Final    Comment:        The GeneXpert MRSA Assay (FDA approved for NASAL specimens only), is one component of a comprehensive MRSA colonization surveillance program. It is not intended to diagnose MRSA infection nor to guide or monitor treatment for MRSA infections.   CULTURE, BLOOD (ROUTINE X 2) w Reflex to PCR ID Panel     Status: None (Preliminary result)   Collection Time: 10/22/15 11:39 AM  Result Value Ref Range Status   Specimen  Description BLOOD LEFT HAND  Final   Special Requests BOTTLES DRAWN AEROBIC AND ANAEROBIC 1CC  Final   Culture NO GROWTH < 24 HOURS  Final   Report Status PENDING  Incomplete  Culture, expectorated sputum-assessment     Status: None   Collection Time: 10/22/15 12:00 PM  Result Value Ref Range Status   Specimen Description SPUTUM  Final   Special Requests Normal  Final   Sputum evaluation THIS SPECIMEN IS ACCEPTABLE FOR SPUTUM CULTURE  Final   Report Status 10/22/2015 FINAL  Final  Culture, respiratory (NON-Expectorated)     Status: None (Preliminary result)   Collection Time: 10/22/15 12:00 PM  Result Value Ref Range Status  Specimen Description SPUTUM  Final   Special Requests Normal Reflexed from [id number]  Final   Gram Stain   Final    FAIR SPECIMEN - 70-80% WBCS FEW WBC SEEN RARE YEAST RARE GRAM NEGATIVE RODS    Culture   Final    LIGHT GROWTH GRAM NEGATIVE RODS IDENTIFICATION AND SUSCEPTIBILITIES TO FOLLOW    Report Status PENDING  Incomplete  Urine culture     Status: None (Preliminary result)   Collection Time: 10/22/15 12:45 PM  Result Value Ref Range Status   Specimen Description URINE, RANDOM  Final   Special Requests NONE  Final   Culture NO GROWTH < 24 HOURS  Final   Report Status PENDING  Incomplete    Medications:  Scheduled:  . amiodarone  400 mg Oral BID  . antiseptic oral rinse  7 mL Mouth Rinse 6 times per day  . chlorhexidine gluconate  15 mL Mouth/Throat BID  . diazepam  5 mg Intravenous 4 times per day  . feeding supplement (PRO-STAT SUGAR FREE 64)  30 mL Per Tube TID  . free water  100 mL Per Tube 3 times per day  . hydrocortisone sod succinate (SOLU-CORTEF) inj  50 mg Intravenous Q6H  . insulin aspart  0-15 Units Subcutaneous 6 times per day  . insulin aspart  5 Units Subcutaneous Q6H  . meropenem (MERREM) IV  1 g Intravenous Q12H  . pantoprazole sodium  40 mg Per Tube Q24H  . senna-docusate  1 tablet Oral BID   Infusions:  . dexmedetomidine     . feeding supplement (VITAL AF 1.2 CAL) 1,000 mL (10/23/15 0100)  . fentaNYL infusion INTRAVENOUS 250 mcg/hr (10/23/15 1030)  . norepinephrine (LEVOPHED) Adult infusion Stopped (10/23/15 0920)  . propofol (DIPRIVAN) infusion    . pureflow 2,000 mL/hr at 10/22/15 0104  . vasopressin (PITRESSIN) infusion - *FOR SHOCK* Stopped (10/23/15 5631)    Assessment: Pharmacy consulted to adjust medications for 70 yo female ICU patient requiring mechanical ventilation and CRRT. Patient previously ordered heparin drip for NSTEMI, discontinued to due to thrombocytopenia.     Plan:  1. CRRT on hold due to thrombocytopenia. No further renal adjustments warranted at this time.   2. 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 Will continue patient on meropenem 1g IV Q12hr. Will follow cultures and adjust as appropriate.    3. Electrolytes are within normal limits. Will recheck electrolytes with am labs.   4. Will transition patient to senna/docusate 2 tab VT BID.   Pharmacy will continue to monitor and adjust per consult.    Simpson,Michael L 10/23/2015,3:31 PM

## 2015-10-23 NOTE — Progress Notes (Signed)
[facility]  ROUNDING NOTE   Subjective:   CRRT held due to thrombocytopenia. HIT+ net + 3.38 litres  Norepinephrine and vasopressin weaned off  Blood cultures and urine with proteus - on ceftriaxone  Objective:  Vital signs in last 24 hours:  Temp:  [100.8 F (38.2 C)-102.6 F (39.2 C)] 100.9 F (38.3 C) (01/27 0900) Pulse Rate:  [70-89] 71 (01/27 0900) Resp:  [12-30] 30 (01/27 0900) BP: (85-143)/(53-80) 105/63 mmHg (01/27 0900) SpO2:  [89 %-100 %] 100 % (01/27 0900) FiO2 (%):  [30 %-40 %] 30 % (01/27 0841) Weight:  [129.6 kg (285 lb 11.5 oz)] 129.6 kg (285 lb 11.5 oz) (01/27 0500)  Weight change: 7.3 kg (16 lb 1.5 oz) Filed Weights   10/21/15 0500 10/22/15 0411 10/23/15 0500  Weight: 122.9 kg (270 lb 15.1 oz) 122.3 kg (269 lb 10 oz) 129.6 kg (285 lb 11.5 oz)    Intake/Output: I/O last 3 completed shifts: In: 3608.8 [I.V.:2700.9; Other:15; NG/GT:892.8] Out: 497 [Urine:125; Other:372]   Intake/Output this shift:  Total I/O In: -  Out: 25 [Urine:25]  Physical Exam: General: Critically ill, intubated, sedated  Head: +ETT, +OGT  Eyes: Eyes closed, reactive to light, +icterus  Neck: RIJ temp HD catheter, L IJ central line 1/25 Dr. Lucky Cowboy  Lungs:  PRVC FiO2 30%  Heart: Irregular rhythm, tachycardia  Abdomen:  Soft, nontender, obese  Extremities: 3+ peripheral edema. +anasarca  Neurologic: Sedated, intubated  Skin: mottling  Access: RIJ vascath 10/20/15 Dr. Stevenson Clinch    Basic Metabolic Panel:  Recent Labs Lab 10/21/15 0515 10/21/15 1755 10/22/15 0503 10/22/15 1323 10/22/15 2053 10/23/15 0449  NA 135 136 137  137 137 139 138  K 3.9 4.2 4.0  4.0 3.9 3.7 3.6  CL 103 101 101  102 102 100* 100*  CO2 18* '23 26  24 27 26 25  '$ GLUCOSE 263* 317* 364*  356* 314* 199* 246*  BUN 45* 34* 35*  34* 41* 49* 60*  CREATININE 1.87* 1.59* 1.34*  1.34* 1.72* 2.06* 2.48*  CALCIUM 7.3* 7.6* 7.7*  7.6* 7.9* 7.8* 7.7*  MG 1.5* 1.6* 1.6*  --   --  2.2  PHOS  3.1 2.3* 1.9* 1.7* 2.8 2.8    Liver Function Tests:  Recent Labs Lab 10/20/15 0957 10/21/15 0515 10/21/15 1755 10/22/15 0503 10/22/15 1323 10/22/15 2053 10/23/15 0449  AST 124* 312*  --  428*  --   --   --   ALT 63* 176*  --  276*  --   --   --   ALKPHOS 567* 391*  --  209*  --   --   --   BILITOT 2.5* 3.1*  --  4.8*  --   --   --   PROT 7.4 5.3*  --  4.9*  --   --   --   ALBUMIN 3.1* 2.1* 1.9* 1.8*  1.8* 1.8* 1.8* 1.7*    Recent Labs Lab 10/20/15 0957  LIPASE 11    Recent Labs Lab 10/20/15 0957  AMMONIA 34    CBC:  Recent Labs Lab 10/20/15 0957 10/20/15 2242 10/21/15 0515 10/22/15 0503 10/22/15 1323 10/23/15 0911  WBC 50.5* 47.1* 38.8* 19.7* 18.8* 23.4*  NEUTROABS 47.0*  --   --   --   --   --   HGB 16.4* 13.6 13.3 12.1 12.3 12.1  HCT 49.9* 43.1 40.2 37.0 36.8 36.1  MCV 90.2 95.0 90.8 89.9 89.3 88.2  PLT 62* 46* 23* 12* 14* 10*  Cardiac Enzymes:  Recent Labs Lab 10/20/15 0957 10/20/15 1010 10/20/15 2242 10/21/15 1755 10/22/15 0106 10/22/15 0503  CKTOTAL  --  1562* 779*  --   --  162  TROPONINI 1.21*  --  1.08* 0.44* 0.27*  --     BNP: Invalid input(s): POCBNP  CBG:  Recent Labs Lab 10/23/15 0317 10/23/15 0440 10/23/15 0501 10/23/15 0611 10/23/15 0719  GLUCAP 84 61* 140* 230* 207*    Microbiology: Results for orders placed or performed during the hospital encounter of 10/20/15  Urine culture     Status: None   Collection Time: 10/20/15  9:57 AM  Result Value Ref Range Status   Specimen Description URINE, RANDOM  Final   Special Requests NONE  Final   Culture >=100,000 COLONIES/mL PROTEUS MIRABILIS  Final   Report Status 10/22/2015 FINAL  Final   Organism ID, Bacteria PROTEUS MIRABILIS  Final      Susceptibility   Proteus mirabilis - MIC*    AMPICILLIN <=2 SENSITIVE Sensitive     GENTAMICIN <=1 SENSITIVE Sensitive     CEFTRIAXONE Value in next row Sensitive      SENSITIVE<=1    CIPROFLOXACIN Value in next row Sensitive       SENSITIVE<=0.25    IMIPENEM Value in next row Sensitive      SENSITIVE2    NITROFURANTOIN Value in next row Resistant      RESISTANT128    TRIMETH/SULFA Value in next row Sensitive      SENSITIVE<=20    PIP/TAZO Value in next row Sensitive      SENSITIVE<=4    AMPICILLIN/SULBACTAM Value in next row Sensitive      SENSITIVE<=2    * >=100,000 COLONIES/mL PROTEUS MIRABILIS  Culture, blood (routine x 2)     Status: None   Collection Time: 10/20/15  9:57 AM  Result Value Ref Range Status   Specimen Description BLOOD RIGHT WRIST  Final   Special Requests   Final    BOTTLES DRAWN AEROBIC AND ANAEROBIC ANA 1ML AER 4ML   Culture  Setup Time   Final    GRAM NEGATIVE RODS IN BOTH AEROBIC AND ANAEROBIC BOTTLES CRITICAL RESULT CALLED TO, READ BACK BY AND VERIFIED WITH: MATT MCBANE ON 10/21/15 AT 0005 [facility] CONFIRMED BY [place]    Culture   Final    PROTEUS MIRABILIS IN BOTH AEROBIC AND ANAEROBIC BOTTLES    Report Status 10/22/2015 FINAL  Final   Organism ID, Bacteria PROTEUS MIRABILIS  Final      Susceptibility   Proteus mirabilis - MIC*    AMPICILLIN/SULBACTAM Value in next row Sensitive      SENSITIVE<=2    PIP/TAZO Value in next row Sensitive      SENSITIVE<=4    CEFTRIAXONE Value in next row Sensitive      SENSITIVE<=1    IMIPENEM Value in next row Sensitive      SENSITIVE2    GENTAMICIN Value in next row Sensitive      SENSITIVE<=1    CIPROFLOXACIN Value in next row Sensitive      SENSITIVE<=0.25    * PROTEUS MIRABILIS  Culture, blood (routine x 2)     Status: None   Collection Time: 10/20/15  9:57 AM  Result Value Ref Range Status   Specimen Description BLOOD LEFT HAND  Final   Special Requests   Final    BOTTLES DRAWN AEROBIC AND ANAEROBIC AER 3ML ANA 2ML   Culture  Setup Time   Final  GRAM NEGATIVE RODS IN BOTH AEROBIC AND ANAEROBIC BOTTLES CRITICAL VALUE NOTED.  VALUE IS CONSISTENT WITH PREVIOUSLY REPORTED AND CALLED VALUE.    Culture   Final    PROTEUS  MIRABILIS IN BOTH AEROBIC AND ANAEROBIC BOTTLES    Report Status 10/22/2015 FINAL  Final   Organism ID, Bacteria PROTEUS MIRABILIS  Final      Susceptibility   Proteus mirabilis - MIC*    AMPICILLIN Value in next row Sensitive      SENSITIVE<=2    PIP/TAZO Value in next row Sensitive      SENSITIVE<=4    CEFTAZIDIME Value in next row Sensitive      SENSITIVE<=1    CEFTRIAXONE Value in next row Sensitive      SENSITIVE<=1    CEFEPIME Value in next row Sensitive      SENSITIVE<=1    IMIPENEM Value in next row Sensitive      SENSITIVE2    GENTAMICIN Value in next row Sensitive      SENSITIVE<=1    CIPROFLOXACIN Value in next row Sensitive      SENSITIVE<=0.25    * PROTEUS MIRABILIS  Blood Culture ID Panel (Reflexed)     Status: Abnormal   Collection Time: 10/20/15  9:57 AM  Result Value Ref Range Status   Enterococcus species NOT DETECTED NOT DETECTED Final   Listeria monocytogenes NOT DETECTED NOT DETECTED Final   Staphylococcus species NOT DETECTED NOT DETECTED Final   Staphylococcus aureus NOT DETECTED NOT DETECTED Final   Streptococcus species NOT DETECTED NOT DETECTED Final   Streptococcus agalactiae NOT DETECTED NOT DETECTED Final   Streptococcus pneumoniae NOT DETECTED NOT DETECTED Final   Streptococcus pyogenes NOT DETECTED NOT DETECTED Final   Acinetobacter baumannii NOT DETECTED NOT DETECTED Final   Enterobacteriaceae species DETECTED (A) NOT DETECTED Final    Comment: CRITICAL RESULT CALLED TO, READ BACK BY AND VERIFIED WITH: MATT MCBANE ON 10/21/15 AT 0005 [facility]    Enterobacter cloacae complex NOT DETECTED NOT DETECTED Final   Escherichia coli NOT DETECTED NOT DETECTED Final   Klebsiella oxytoca NOT DETECTED NOT DETECTED Final   Klebsiella pneumoniae NOT DETECTED NOT DETECTED Final   Proteus species (A) NOT DETECTED Final    CRITICAL RESULT CALLED TO, READ BACK BY AND VERIFIED WITH:    Comment: MATT MCBANE ON 10/21/15 AT 0005 [place]   Serratia marcescens NOT DETECTED  NOT DETECTED Final   Haemophilus influenzae NOT DETECTED NOT DETECTED Final   Neisseria meningitidis NOT DETECTED NOT DETECTED Final   Pseudomonas aeruginosa NOT DETECTED NOT DETECTED Final   Candida albicans NOT DETECTED NOT DETECTED Final   Candida glabrata NOT DETECTED NOT DETECTED Final   Candida krusei NOT DETECTED NOT DETECTED Final   Candida parapsilosis NOT DETECTED NOT DETECTED Final   Candida tropicalis NOT DETECTED NOT DETECTED Final   Carbapenem resistance NOT DETECTED NOT DETECTED Final   Methicillin resistance NOT DETECTED NOT DETECTED Final   Vancomycin resistance NOT DETECTED NOT DETECTED Final  MRSA PCR Screening     Status: None   Collection Time: 10/20/15  7:41 PM  Result Value Ref Range Status   MRSA by PCR NEGATIVE NEGATIVE Final    Comment:        The GeneXpert MRSA Assay (FDA approved for NASAL specimens only), is one component of a comprehensive MRSA colonization surveillance program. It is not intended to diagnose MRSA infection nor to guide or monitor treatment for MRSA infections.   CULTURE, BLOOD (ROUTINE X 2)  w Reflex to PCR ID Panel     Status: None (Preliminary result)   Collection Time: 10/22/15 11:39 AM  Result Value Ref Range Status   Specimen Description BLOOD LEFT HAND  Final   Special Requests BOTTLES DRAWN AEROBIC AND ANAEROBIC 1CC  Final   Culture NO GROWTH < 24 HOURS  Final   Report Status PENDING  Incomplete  Culture, expectorated sputum-assessment     Status: None   Collection Time: 10/22/15 12:00 PM  Result Value Ref Range Status   Specimen Description SPUTUM  Final   Special Requests Normal  Final   Sputum evaluation THIS SPECIMEN IS ACCEPTABLE FOR SPUTUM CULTURE  Final   Report Status 10/22/2015 FINAL  Final  Culture, respiratory (NON-Expectorated)     Status: None (Preliminary result)   Collection Time: 10/22/15 12:00 PM  Result Value Ref Range Status   Specimen Description SPUTUM  Final   Special Requests Normal Reflexed from  [id number]  Final   Gram Stain PENDING  Incomplete   Culture   Final    LIGHT GROWTH GRAM NEGATIVE RODS IDENTIFICATION AND SUSCEPTIBILITIES TO FOLLOW    Report Status PENDING  Incomplete  Urine culture     Status: None (Preliminary result)   Collection Time: 10/22/15 12:45 PM  Result Value Ref Range Status   Specimen Description URINE, RANDOM  Final   Special Requests NONE  Final   Culture NO GROWTH < 24 HOURS  Final   Report Status PENDING  Incomplete    Coagulation Studies:  Recent Labs  10/21/15 0023  LABPROT 18.4*  INR 1.52    Urinalysis: No results for input(s): COLORURINE, LABSPEC, PHURINE, GLUCOSEU, HGBUR, BILIRUBINUR, KETONESUR, PROTEINUR, UROBILINOGEN, NITRITE, LEUKOCYTESUR in the last 72 hours.  Invalid input(s): APPERANCEUR    Imaging: Dg Abd 1 View  10/22/2015  CLINICAL DATA:  Abdominal distention. EXAM: ABDOMEN - 1 VIEW COMPARISON:  Radiograph 1257, CT 10/20/2015 FINDINGS: Exam is limited due to patient body habitus. NG tube extends to gastric antral region. No dilated loops of large or small bowel. Gas in the rectum. IMPRESSION: 1. NG tube in stomach. 2. No evidence of bowel obstruction. Electronically Signed   By: Suzy Bouchard M.D.   On: 10/22/2015 15:53   Dg Chest Port 1 View  10/21/2015  CLINICAL DATA:  Acute respiratory failure. On ventilator. Acute renal failure. Non ST elevated myocardial infarct. Sepsis. EXAM: PORTABLE CHEST 1 VIEW COMPARISON:  10/20/2015 FINDINGS: New left internal jugular central venous catheter is seen with tip overlying the superior cavoatrial junction. No pneumothorax visualized. Right jugular central venous catheter remains in appropriate position as well as endotracheal tube and nasogastric tube. Patient is partially rotated to the right. Mild cardiomegaly and probable mild interstitial edema shows no significant change. Bibasilar atelectasis and probable small left pleural effusion are also stable. IMPRESSION: New left internal jugular  central venous catheter in appropriate position. No pneumothorax visualized. No significant change in cardiomegaly, probable interstitial edema, bibasilar atelectasis, and probable small left pleural effusion. Electronically Signed   By: Earle Gell M.D.   On: 10/21/2015 16:48     Medications:   . amiodarone 60 mg/hr (10/23/15 0654)  . dexmedetomidine    . feeding supplement (VITAL AF 1.2 CAL) 1,000 mL (10/23/15 0100)  . fentaNYL infusion INTRAVENOUS 250 mcg/hr (10/23/15 1030)  . norepinephrine (LEVOPHED) Adult infusion Stopped (10/23/15 0920)  . pureflow 2,000 mL/hr at 10/22/15 0104  . vasopressin (PITRESSIN) infusion - *FOR SHOCK* Stopped (10/23/15 0950)   . antiseptic oral  rinse  7 mL Mouth Rinse 6 times per day  . bisacodyl  10 mg Rectal Once  . chlorhexidine gluconate  15 mL Mouth/Throat BID  . feeding supplement (PRO-STAT SUGAR FREE 64)  30 mL Per Tube TID  . free water  100 mL Per Tube 3 times per day  . hydrocortisone sod succinate (SOLU-CORTEF) inj  50 mg Intravenous Q6H  . insulin aspart  0-15 Units Subcutaneous 6 times per day  . insulin aspart  5 Units Subcutaneous Q6H  . pantoprazole (PROTONIX) IV  40 mg Intravenous QHS  . senna-docusate  1 tablet Oral BID   acetaminophen **OR** acetaminophen  Assessment/ Plan:  Ms. DORATHY STALLONE is a 70 y.o. white female with unknown past medications history, who was admitted to [facility] on 10/20/2015  1. Acute renal failure with metabolic acidosis: oliguric with hematuria, proteinuria and pyuria. Hemodynamically unstable on vasopressors: Unknown baseline creatinine.  CT with right sided perinephric edema.  Foley placed. Ultrasound showed improvement Acute renal failure from sepsis and rhabdomyolysis.  - hold CRRT until platelets start to improve. No indication for diuretics.  - monitor volume status, urine output, electrolytes and renal function - serologic testing : negative 10/21/15  2. Sepsis/hypotension: wbc improving but still  critically high. Febrile. Blood cultures with proteus and enterobacter species. On ceftriaxone.  - Norepinephrine and vasopressin  3. Atrial fibrillation: new onset  - amiodarone gtt.  - echocardiogram reviewed.   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 10,000 this morning. Agree with holding all heparin products. Received heparin initially due to concerns of myocardial ischemia. No heparin given with dialysis.    LOS: 3 Aoi Kouns 1/27/201711:39 AM

## 2015-10-23 NOTE — Progress Notes (Signed)
Patient sedation increased, opens eyes but does not follow any simple commands. She is clamping down, bite block in place, becomes agitated when stimulated, mouth care, or repositioning. Will address in rounds.

## 2015-10-24 LAB — CULTURE, RESPIRATORY: SPECIAL REQUESTS: NORMAL

## 2015-10-24 LAB — CBC
HEMATOCRIT: 33.9 % — AB (ref 35.0–47.0)
HEMOGLOBIN: 11.4 g/dL — AB (ref 12.0–16.0)
MCH: 29.9 pg (ref 26.0–34.0)
MCHC: 33.7 g/dL (ref 32.0–36.0)
MCV: 88.5 fL (ref 80.0–100.0)
Platelets: 16 10*3/uL — CL (ref 150–440)
RBC: 3.83 MIL/uL (ref 3.80–5.20)
RDW: 14.6 % — AB (ref 11.5–14.5)
WBC: 28.5 10*3/uL — ABNORMAL HIGH (ref 3.6–11.0)

## 2015-10-24 LAB — RENAL FUNCTION PANEL
Albumin: 1.7 g/dL — ABNORMAL LOW (ref 3.5–5.0)
Anion gap: 9 (ref 5–15)
BUN: 91 mg/dL — AB (ref 6–20)
CHLORIDE: 103 mmol/L (ref 101–111)
CO2: 26 mmol/L (ref 22–32)
CREATININE: 2.95 mg/dL — AB (ref 0.44–1.00)
Calcium: 7.5 mg/dL — ABNORMAL LOW (ref 8.9–10.3)
GFR calc Af Amer: 18 mL/min — ABNORMAL LOW (ref >60)
GFR calc non Af Amer: 15 mL/min — ABNORMAL LOW (ref >60)
GLUCOSE: 238 mg/dL — AB (ref 65–99)
POTASSIUM: 3.7 mmol/L (ref 3.5–5.1)
Phosphorus: 3.2 mg/dL (ref 2.5–4.6)
Sodium: 138 mmol/L (ref 135–145)

## 2015-10-24 LAB — URINE CULTURE: Culture: NO GROWTH

## 2015-10-24 LAB — GLUCOSE, CAPILLARY
GLUCOSE-CAPILLARY: 183 mg/dL — AB (ref 65–99)
GLUCOSE-CAPILLARY: 225 mg/dL — AB (ref 65–99)
GLUCOSE-CAPILLARY: 229 mg/dL — AB (ref 65–99)
GLUCOSE-CAPILLARY: 242 mg/dL — AB (ref 65–99)
Glucose-Capillary: 193 mg/dL — ABNORMAL HIGH (ref 65–99)
Glucose-Capillary: 201 mg/dL — ABNORMAL HIGH (ref 65–99)

## 2015-10-24 LAB — HEPATITIS B SURFACE ANTIGEN: Hepatitis B Surface Ag: NEGATIVE

## 2015-10-24 LAB — MAGNESIUM: Magnesium: 2.3 mg/dL (ref 1.7–2.4)

## 2015-10-24 LAB — CULTURE, RESPIRATORY W GRAM STAIN

## 2015-10-24 MED ORDER — ANTISEPTIC ORAL RINSE SOLUTION (CORINZ)
7.0000 mL | Freq: Four times a day (QID) | OROMUCOSAL | Status: DC
  Administered 2015-10-24 – 2015-10-25 (×5): 7 mL via OROMUCOSAL
  Filled 2015-10-24 (×7): qty 7

## 2015-10-24 MED ORDER — VITAL AF 1.2 CAL PO LIQD
1000.0000 mL | ORAL | Status: DC
  Administered 2015-10-24 – 2015-10-27 (×4): 1000 mL

## 2015-10-24 MED ORDER — INSULIN ASPART 100 UNIT/ML ~~LOC~~ SOLN
5.0000 [IU] | SUBCUTANEOUS | Status: DC
  Administered 2015-10-24 – 2015-10-26 (×10): 5 [IU] via SUBCUTANEOUS
  Filled 2015-10-24 (×11): qty 5

## 2015-10-24 MED ORDER — SODIUM CHLORIDE 0.9 % IV SOLN
500.0000 mg | INTRAVENOUS | Status: DC
  Administered 2015-10-25: 500 mg via INTRAVENOUS
  Filled 2015-10-24 (×2): qty 0.5

## 2015-10-24 MED ORDER — BISACODYL 10 MG RE SUPP
10.0000 mg | Freq: Once | RECTAL | Status: AC
  Administered 2015-10-24: 10 mg via RECTAL
  Filled 2015-10-24: qty 1

## 2015-10-24 MED ORDER — CHLORHEXIDINE GLUCONATE 0.12% ORAL RINSE (MEDLINE KIT)
15.0000 mL | Freq: Two times a day (BID) | OROMUCOSAL | Status: DC
  Administered 2015-10-24 – 2015-10-25 (×3): 15 mL via OROMUCOSAL
  Filled 2015-10-24 (×4): qty 15

## 2015-10-24 NOTE — Progress Notes (Signed)
    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  April [name], [place] 10/24/2015 5:35 PM --  Filed Vitals:   10/24/15 1500 10/24/15 1600  BP: 131/85 118/65  Pulse: 76 76  Temp: 99.1 F (37.3 C) 99.3 F (37.4 C)  Resp: 30 30    Intake/Output Summary (Last 24 hours) at 10/24/15 1735 Last data filed at 10/24/15 1700  Gross per 24 hour  Intake 2386.71 ml  Output   1145 ml  Net 1241.71 ml     Laboratory CBC    Component Value Date/Time   WBC 28.5* 10/24/2015 0444   HGB 11.4* 10/24/2015 0444   HCT 33.9* 10/24/2015 0444   PLT 16* 10/24/2015 0444    BMET    Component Value Date/Time   NA 138 10/24/2015 0444   K 3.7 10/24/2015 0444   CL 103 10/24/2015 0444   CO2 26 10/24/2015 0444   GLUCOSE 238* 10/24/2015 0444   BUN 91* 10/24/2015 0444   CREATININE 2.95* 10/24/2015 0444   CALCIUM 7.5* 10/24/2015 0444   GFRNONAA 15* 10/24/2015 0444   GFRAA 18* 10/24/2015 0444    COAG Lab Results  Component Value Date   INR 1.52 10/21/2015   INR 1.46 10/20/2015   No results found for: PTT  Antibiotics Anti-infectives    Start     Dose/Rate Route Frequency Ordered Stop   10/25/15 1800  meropenem (MERREM) 500 mg in sodium chloride 0.9 % 50 mL IVPB     500 mg 100 mL/hr over 30 Minutes Intravenous Every 24 hours 10/24/15 1025     10/23/15 1230  meropenem (MERREM) 1 g in sodium chloride 0.9 % 100 mL IVPB  Status:  Discontinued     1 g 200 mL/hr over 30 Minutes Intravenous Every 12 hours 10/23/15 1140 10/24/15 1025   10/22/15 1800  cefTRIAXone (ROCEPHIN) 2 g in dextrose 5 % 50 mL IVPB  Status:  Discontinued     2 g 100 mL/hr over 30 Minutes Intravenous Every 24 hours 10/21/15 1115 10/23/15 1136   10/21/15 1600  cefTRIAXone (ROCEPHIN) 2 g in dextrose 5 % 50 mL IVPB     2 g 100 mL/hr over 30 Minutes  Intravenous  Once 10/21/15 1115 10/21/15 1714   10/20/15 1900  piperacillin-tazobactam (ZOSYN) IVPB 3.375 g  Status:  Discontinued     3.375 g 12.5 mL/hr over 240 Minutes Intravenous 3 times per day 10/20/15 1445 10/21/15 1051   10/20/15 1500  vancomycin (VANCOCIN) 1,500 mg in sodium chloride 0.9 % 500 mL IVPB     1,500 mg 250 mL/hr over 120 Minutes Intravenous STAT 10/20/15 1450 10/20/15 1710   10/20/15 1445  vancomycin (VANCOCIN) IVPB 1000 mg/200 mL premix  Status:  Discontinued     1,000 mg 200 mL/hr over 60 Minutes Intravenous STAT 10/20/15 1441 10/20/15 1450   10/20/15 1100  vancomycin (VANCOCIN) IVPB 1000 mg/200 mL premix     1,000 mg 200 mL/hr over 60 Minutes Intravenous  Once 10/20/15 1053 10/20/15 1203   10/20/15 1100  piperacillin-tazobactam (ZOSYN) IVPB 3.375 g     3.375 g 12.5 mL/hr over 240 Minutes Intravenous  Once 10/20/15 1053 10/20/15 1139       V. Leia Alf, M.D. [facility] Office: [phone] Pager:  [phone]

## 2015-10-24 NOTE — Progress Notes (Signed)
[facility]  ROUNDING NOTE   Subjective:   CRRT held due to thrombocytopenia. HIT+. Platelets 14,000 (10) UOP 535 net + 1 litre  Norepinephrine and vasopressin weaned off  Blood cultures and urine with proteus - on ceftriaxone  Family at bedside  Objective:  Vital signs in last 24 hours:  Temp:  [98.2 F (36.8 C)-100.6 F (38.1 C)] 99 F (37.2 C) (01/28 0900) Pulse Rate:  [66-81] 73 (01/28 0900) Resp:  [0-30] 30 (01/28 0900) BP: (93-127)/(52-89) 127/78 mmHg (01/28 0900) SpO2:  [96 %-100 %] 96 % (01/28 0900) FiO2 (%):  [30 %] 30 % (01/28 0900) Weight:  [128.4 kg (283 lb 1.1 oz)] 128.4 kg (283 lb 1.1 oz) (01/28 0408)  Weight change: -1.2 kg (-2 lb 10.3 oz) Filed Weights   10/22/15 0411 10/23/15 0500 10/24/15 0408  Weight: 122.3 kg (269 lb 10 oz) 129.6 kg (285 lb 11.5 oz) 128.4 kg (283 lb 1.1 oz)    Intake/Output: I/O last 3 completed shifts: In: 3526.3 [I.V.:1643.5; NG/GT:1782.8; IV Piggyback:100] Out: 605 [Urine:605]   Intake/Output this shift:  Total I/O In: 94 [I.V.:34; NG/GT:60] Out: 180 [Urine:180]  Physical Exam: General: Critically ill, intubated, sedated  Head: +ETT, +OGT  Eyes: Eyes closed, reactive to light, +icterus  Neck: RIJ temp HD catheter, L IJ central line 1/25 Dr. Lucky Cowboy  Lungs:  PRVC FiO2 30%  Heart: Irregular rhythm, tachycardia  Abdomen:  Soft, nontender, obese  Extremities: 3+ peripheral edema. +anasarca  Neurologic: Sedated, intubated  Skin: mottling  Access: RIJ vascath 10/20/15 Dr. Stevenson Clinch    Basic Metabolic Panel:  Recent Labs Lab 10/21/15 0515 10/21/15 1755 10/22/15 0503 10/22/15 1323 10/22/15 2053 10/23/15 0449 10/24/15 0444  NA 135 136 137  137 137 139 138 138  K 3.9 4.2 4.0  4.0 3.9 3.7 3.6 3.7  CL 103 101 101  102 102 100* 100* 103  CO2 18* '23 26  24 27 26 25 26  '$ GLUCOSE 263* 317* 364*  356* 314* 199* 246* 238*  BUN 45* 34* 35*  34* 41* 49* 60* 91*  CREATININE 1.87* 1.59* 1.34*  1.34* 1.72* 2.06*  2.48* 2.95*  CALCIUM 7.3* 7.6* 7.7*  7.6* 7.9* 7.8* 7.7* 7.5*  MG 1.5* 1.6* 1.6*  --   --  2.2 2.3  PHOS 3.1 2.3* 1.9* 1.7* 2.8 2.8 3.2    Liver Function Tests:  Recent Labs Lab 10/20/15 0957 10/21/15 0515  10/22/15 0503 10/22/15 1323 10/22/15 2053 10/23/15 0449 10/24/15 0444  AST 124* 312*  --  428*  --   --   --   --   ALT 63* 176*  --  276*  --   --   --   --   ALKPHOS 567* 391*  --  209*  --   --   --   --   BILITOT 2.5* 3.1*  --  4.8*  --   --   --   --   PROT 7.4 5.3*  --  4.9*  --   --   --   --   ALBUMIN 3.1* 2.1*  < > 1.8*  1.8* 1.8* 1.8* 1.7* 1.7*  < > = values in this interval not displayed.  Recent Labs Lab 10/20/15 0957  LIPASE 11    Recent Labs Lab 10/20/15 0957  AMMONIA 34    CBC:  Recent Labs Lab 10/20/15 0957  10/21/15 0515 10/22/15 0503 10/22/15 1323 10/23/15 0911 10/24/15 0444  WBC 50.5*  < > 38.8* 19.7* 18.8*  23.4* 28.5*  NEUTROABS 47.0*  --   --   --   --   --   --   HGB 16.4*  < > 13.3 12.1 12.3 12.1 11.4*  HCT 49.9*  < > 40.2 37.0 36.8 36.1 33.9*  MCV 90.2  < > 90.8 89.9 89.3 88.2 88.5  PLT 62*  < > 23* 12* 14* 10* 16*  < > = values in this interval not displayed.  Cardiac Enzymes:  Recent Labs Lab 10/20/15 0957 10/20/15 1010 10/20/15 2242 10/21/15 1755 10/22/15 0106 10/22/15 0503  CKTOTAL  --  1562* 779*  --   --  162  TROPONINI 1.21*  --  1.08* 0.44* 0.27*  --     BNP: Invalid input(s): POCBNP  CBG:  Recent Labs Lab 10/23/15 1708 10/23/15 1938 10/23/15 2342 10/24/15 0348 10/24/15 0753  GLUCAP 259* 264* 259* 193* 201*    Microbiology: Results for orders placed or performed during the hospital encounter of 10/20/15  Urine culture     Status: None   Collection Time: 10/20/15  9:57 AM  Result Value Ref Range Status   Specimen Description URINE, RANDOM  Final   Special Requests NONE  Final   Culture >=100,000 COLONIES/mL PROTEUS MIRABILIS  Final   Report Status 10/22/2015 FINAL  Final   Organism ID,  Bacteria PROTEUS MIRABILIS  Final      Susceptibility   Proteus mirabilis - MIC*    AMPICILLIN <=2 SENSITIVE Sensitive     GENTAMICIN <=1 SENSITIVE Sensitive     CEFTRIAXONE Value in next row Sensitive      SENSITIVE<=1    CIPROFLOXACIN Value in next row Sensitive      SENSITIVE<=0.25    IMIPENEM Value in next row Sensitive      SENSITIVE2    NITROFURANTOIN Value in next row Resistant      RESISTANT128    TRIMETH/SULFA Value in next row Sensitive      SENSITIVE<=20    PIP/TAZO Value in next row Sensitive      SENSITIVE<=4    AMPICILLIN/SULBACTAM Value in next row Sensitive      SENSITIVE<=2    * >=100,000 COLONIES/mL PROTEUS MIRABILIS  Culture, blood (routine x 2)     Status: None   Collection Time: 10/20/15  9:57 AM  Result Value Ref Range Status   Specimen Description BLOOD RIGHT WRIST  Final   Special Requests   Final    BOTTLES DRAWN AEROBIC AND ANAEROBIC ANA 1ML AER 4ML   Culture  Setup Time   Final    GRAM NEGATIVE RODS IN BOTH AEROBIC AND ANAEROBIC BOTTLES CRITICAL RESULT CALLED TO, READ BACK BY AND VERIFIED WITH: MATT MCBANE ON 10/21/15 AT 0005 [facility] CONFIRMED BY [place]    Culture   Final    PROTEUS MIRABILIS IN BOTH AEROBIC AND ANAEROBIC BOTTLES    Report Status 10/22/2015 FINAL  Final   Organism ID, Bacteria PROTEUS MIRABILIS  Final      Susceptibility   Proteus mirabilis - MIC*    AMPICILLIN/SULBACTAM Value in next row Sensitive      SENSITIVE<=2    PIP/TAZO Value in next row Sensitive      SENSITIVE<=4    CEFTRIAXONE Value in next row Sensitive      SENSITIVE<=1    IMIPENEM Value in next row Sensitive      SENSITIVE2    GENTAMICIN Value in next row Sensitive      SENSITIVE<=1    CIPROFLOXACIN Value in next row  Sensitive      SENSITIVE<=0.25    * PROTEUS MIRABILIS  Culture, blood (routine x 2)     Status: None   Collection Time: 10/20/15  9:57 AM  Result Value Ref Range Status   Specimen Description BLOOD LEFT HAND  Final   Special Requests   Final     BOTTLES DRAWN AEROBIC AND ANAEROBIC AER 3ML ANA 2ML   Culture  Setup Time   Final    GRAM NEGATIVE RODS IN BOTH AEROBIC AND ANAEROBIC BOTTLES CRITICAL VALUE NOTED.  VALUE IS CONSISTENT WITH PREVIOUSLY REPORTED AND CALLED VALUE.    Culture   Final    PROTEUS MIRABILIS IN BOTH AEROBIC AND ANAEROBIC BOTTLES    Report Status 10/22/2015 FINAL  Final   Organism ID, Bacteria PROTEUS MIRABILIS  Final      Susceptibility   Proteus mirabilis - MIC*    AMPICILLIN Value in next row Sensitive      SENSITIVE<=2    PIP/TAZO Value in next row Sensitive      SENSITIVE<=4    CEFTAZIDIME Value in next row Sensitive      SENSITIVE<=1    CEFTRIAXONE Value in next row Sensitive      SENSITIVE<=1    CEFEPIME Value in next row Sensitive      SENSITIVE<=1    IMIPENEM Value in next row Sensitive      SENSITIVE2    GENTAMICIN Value in next row Sensitive      SENSITIVE<=1    CIPROFLOXACIN Value in next row Sensitive      SENSITIVE<=0.25    * PROTEUS MIRABILIS  Blood Culture ID Panel (Reflexed)     Status: Abnormal   Collection Time: 10/20/15  9:57 AM  Result Value Ref Range Status   Enterococcus species NOT DETECTED NOT DETECTED Final   Listeria monocytogenes NOT DETECTED NOT DETECTED Final   Staphylococcus species NOT DETECTED NOT DETECTED Final   Staphylococcus aureus NOT DETECTED NOT DETECTED Final   Streptococcus species NOT DETECTED NOT DETECTED Final   Streptococcus agalactiae NOT DETECTED NOT DETECTED Final   Streptococcus pneumoniae NOT DETECTED NOT DETECTED Final   Streptococcus pyogenes NOT DETECTED NOT DETECTED Final   Acinetobacter baumannii NOT DETECTED NOT DETECTED Final   Enterobacteriaceae species DETECTED (A) NOT DETECTED Final    Comment: CRITICAL RESULT CALLED TO, READ BACK BY AND VERIFIED WITH: MATT MCBANE ON 10/21/15 AT 0005 [place]    Enterobacter cloacae complex NOT DETECTED NOT DETECTED Final   Escherichia coli NOT DETECTED NOT DETECTED Final   Klebsiella oxytoca NOT DETECTED  NOT DETECTED Final   Klebsiella pneumoniae NOT DETECTED NOT DETECTED Final   Proteus species (A) NOT DETECTED Final    CRITICAL RESULT CALLED TO, READ BACK BY AND VERIFIED WITH:    Comment: MATT MCBANE ON 10/21/15 AT 0005 [place]   Serratia marcescens NOT DETECTED NOT DETECTED Final   Haemophilus influenzae NOT DETECTED NOT DETECTED Final   Neisseria meningitidis NOT DETECTED NOT DETECTED Final   Pseudomonas aeruginosa NOT DETECTED NOT DETECTED Final   Candida albicans NOT DETECTED NOT DETECTED Final   Candida glabrata NOT DETECTED NOT DETECTED Final   Candida krusei NOT DETECTED NOT DETECTED Final   Candida parapsilosis NOT DETECTED NOT DETECTED Final   Candida tropicalis NOT DETECTED NOT DETECTED Final   Carbapenem resistance NOT DETECTED NOT DETECTED Final   Methicillin resistance NOT DETECTED NOT DETECTED Final   Vancomycin resistance NOT DETECTED NOT DETECTED Final  MRSA PCR Screening  Status: None   Collection Time: 10/20/15  7:41 PM  Result Value Ref Range Status   MRSA by PCR NEGATIVE NEGATIVE Final    Comment:        The GeneXpert MRSA Assay (FDA approved for NASAL specimens only), is one component of a comprehensive MRSA colonization surveillance program. It is not intended to diagnose MRSA infection nor to guide or monitor treatment for MRSA infections.   CULTURE, BLOOD (ROUTINE X 2) w Reflex to PCR ID Panel     Status: None (Preliminary result)   Collection Time: 10/22/15 11:39 AM  Result Value Ref Range Status   Specimen Description BLOOD LEFT HAND  Final   Special Requests BOTTLES DRAWN AEROBIC AND ANAEROBIC 1CC  Final   Culture NO GROWTH 2 DAYS  Final   Report Status PENDING  Incomplete  Culture, expectorated sputum-assessment     Status: None   Collection Time: 10/22/15 12:00 PM  Result Value Ref Range Status   Specimen Description SPUTUM  Final   Special Requests Normal  Final   Sputum evaluation THIS SPECIMEN IS ACCEPTABLE FOR SPUTUM CULTURE  Final    Report Status 10/22/2015 FINAL  Final  Culture, respiratory (NON-Expectorated)     Status: None (Preliminary result)   Collection Time: 10/22/15 12:00 PM  Result Value Ref Range Status   Specimen Description SPUTUM  Final   Special Requests Normal Reflexed from [id number]  Final   Gram Stain   Final    FAIR SPECIMEN - 70-80% WBCS FEW WBC SEEN RARE YEAST RARE GRAM NEGATIVE RODS    Culture   Final    LIGHT GROWTH GRAM NEGATIVE RODS IDENTIFICATION AND SUSCEPTIBILITIES TO FOLLOW    Report Status PENDING  Incomplete  Urine culture     Status: None (Preliminary result)   Collection Time: 10/22/15 12:45 PM  Result Value Ref Range Status   Specimen Description URINE, RANDOM  Final   Special Requests NONE  Final   Culture NO GROWTH < 24 HOURS  Final   Report Status PENDING  Incomplete    Coagulation Studies: No results for input(s): LABPROT, INR in the last 72 hours.  Urinalysis: No results for input(s): COLORURINE, LABSPEC, PHURINE, GLUCOSEU, HGBUR, BILIRUBINUR, KETONESUR, PROTEINUR, UROBILINOGEN, NITRITE, LEUKOCYTESUR in the last 72 hours.  Invalid input(s): APPERANCEUR    Imaging: Dg Abd 1 View  10/22/2015  CLINICAL DATA:  Abdominal distention. EXAM: ABDOMEN - 1 VIEW COMPARISON:  Radiograph 1257, CT 10/20/2015 FINDINGS: Exam is limited due to patient body habitus. NG tube extends to gastric antral region. No dilated loops of large or small bowel. Gas in the rectum. IMPRESSION: 1. NG tube in stomach. 2. No evidence of bowel obstruction. Electronically Signed   By: Suzy Bouchard M.D.   On: 10/22/2015 15:53   Dg Chest Port 1 View  10/23/2015  CLINICAL DATA:  Respiratory difficulty EXAM: PORTABLE CHEST 1 VIEW COMPARISON:  10/21/2015 FINDINGS: Endotracheal and NG tubes are stable. Left jugular venous catheter with its tip in the upper SVC is stable. Right jugular dialysis catheter with its tip in the upper SVC is stable. Lungs remain under aerated with central and basilar atelectasis.  Mild vascular congestion is stable. No pneumothorax. IMPRESSION: Stable tubular devices Stable vascular congestion and bibasilar atelectasis. Electronically Signed   By: Marybelle Killings M.D.   On: 10/23/2015 12:42     Medications:   . dexmedetomidine    . feeding supplement (VITAL AF 1.2 CAL) 1,000 mL (10/24/15 0700)  . fentaNYL infusion INTRAVENOUS  Stopped (10/24/15 0840)  . norepinephrine (LEVOPHED) Adult infusion Stopped (10/23/15 0920)  . propofol (DIPRIVAN) infusion    . pureflow 2,000 mL/hr at 10/22/15 0104  . vasopressin (PITRESSIN) infusion - *FOR SHOCK* Stopped (10/23/15 0950)   . amiodarone  400 mg Oral BID  . antiseptic oral rinse  7 mL Mouth Rinse 6 times per day  . chlorhexidine gluconate  15 mL Mouth/Throat BID  . diazepam  5 mg Intravenous 4 times per day  . feeding supplement (PRO-STAT SUGAR FREE 64)  30 mL Per Tube TID  . free water  100 mL Per Tube 3 times per day  . insulin aspart  0-15 Units Subcutaneous 6 times per day  . insulin aspart  5 Units Subcutaneous Q6H  . meropenem (MERREM) IV  1 g Intravenous Q12H  . pantoprazole sodium  40 mg Per Tube Q24H  . senna-docusate  2 tablet Oral BID   acetaminophen **OR** acetaminophen  Assessment/ Plan:  [name] is a 70 y.o. white female with unknown past medications history, who was admitted to [facility] on 10/20/2015  1. Acute renal failure with metabolic acidosis: oliguric with hematuria, proteinuria and pyuria. Hemodynamically unstable on vasopressors: Unknown baseline creatinine.  Acute renal failure from sepsis   - will do a trial of intermittent hemodialysis today. Orders prepared.  - monitor volume status, urine output, electrolytes and renal function - serologic testing : negative 10/21/15  2. Sepsis/hypotension: wbc increasing. Afebrile.  Blood cultures with proteus and enterobacter species. On ceftriaxone.  - Norepinephrine and vasopressin  3. Atrial fibrillation: new onset  - amiodarone -  echocardiogram reviewed.   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

## 2015-10-24 NOTE — Progress Notes (Signed)
Nutrition Follow-up    INTERVENTION:   EN: increase to 40 ml/hr, goal of 50 ml/hr today; continue to assess   NUTRITION DIAGNOSIS:   Inadequate oral intake related to acute illness as evidenced by NPO status.  GOAL:   Provide needs based on ASPEN/SCCM guidelines  MONITOR:    (Energy Intake, Pulmonary, Digestive System, Electrolyte/Renal Profile, Glucose Profile)  REASON FOR ASSESSMENT:   Ventilator    ASSESSMENT:    Pt remains on vent, off CRRT, trial of intermittent HD today, platelet count remains significantly low, off vasopressors   Diet Order:  Diet NPO time specified   EN: tolerating Vital 1.2 AF at rate of 30 ml/hr  Digestive System: no signs of TF intolerance, no BM  Skin:  Reviewed, no issues  Electrolyte and Renal Profile:  Recent Labs Lab 10/22/15 0503  10/22/15 2053 10/23/15 0449 10/24/15 0444  BUN 35*  34*  < > 49* 60* 91*  CREATININE 1.34*  1.34*  < > 2.06* 2.48* 2.95*  NA 137  137  < > 139 138 138  K 4.0  4.0  < > 3.7 3.6 3.7  MG 1.6*  --   --  2.2 2.3  PHOS 1.9*  < > 2.8 2.8 3.2  < > = values in this interval not displayed. Glucose Profile:  Recent Labs  10/23/15 2342 10/24/15 0348 10/24/15 0753  GLUCAP 259* 193* 201*   Nutritional Anemia Profile:  CBC Latest Ref Rng 10/24/2015 10/23/2015 10/22/2015  WBC 3.6 - 11.0 K/uL 28.5(H) 23.4(H) 18.8(H)  Hemoglobin 12.0 - 16.0 g/dL 11.4(L) 12.1 12.3  Hematocrit 35.0 - 47.0 % 33.9(L) 36.1 36.8  Platelets 150 - 440 K/uL 16(LL) 10(LL) 14(LL)    Meds: ss novolog, fentanyl drip  Height:   Ht Readings from Last 1 Encounters:  10/20/15 '5\' 7"'$  (1.702 m)    Weight:   Wt Readings from Last 1 Encounters:  10/24/15 283 lb 1.1 oz (128.4 kg)    BMI:  Body mass index is 44.32 kg/(m^2).  Estimated Nutritional Needs:   Kcal:  6226-3335 kcals (11-14 kcals/kg) using wt of 122.9 kg  Protein:  122-153 g (2.0-2.5 g/kg) using IBW 61 kg  Fluid:  1525-1830 mL (25-30 ml/kg)   EDUCATION NEEDS:    No education needs identified at this time  [place], RD, LDN [phone] Pager  [phone] Weekend/On-Call Pager

## 2015-10-24 NOTE — Progress Notes (Addendum)
[place] NOTE  Pharmacy Consult for CRRT/HD medication management, Meropenem Dosing, Electrolyte Dosing, Constipation Prevention Indication: CRRT (now HD)/ Proteus Bacteremia     Allergies  Allergen Reactions  . Prednisone Anaphylaxis    Patient Measurements: Height: '5\' 7"'$  (170.2 cm) Weight: 283 lb 1.1 oz (128.4 kg) IBW/kg (Calculated) : 61.6   Vital Signs: Temp: 100.2 F (37.9 C) (01/28 0700) Temp Source: Core (Comment) (01/28 0400) BP: 102/68 mmHg (01/28 0700) Pulse Rate: 67 (01/28 0700) Intake/Output from previous day: 01/27 0701 - 01/28 0700 In: 1533.6 [I.V.:573.6; NG/GT:860; IV Piggyback:100] Out: 535 [Urine:535] Intake/Output from this shift:   Vent settings for last 24 hours: Vent Mode:  [-] PRVC FiO2 (%):  [30 %] 30 % Set Rate:  [30 bmp] 30 bmp Vt Set:  [500 mL] 500 mL PEEP:  [5 cmH20] 5 cmH20 Plateau Pressure:  [22 cmH20-23 cmH20] 22 cmH20  Labs:  Recent Labs  10/22/15 0503 10/22/15 1323 10/22/15 2053 10/23/15 0449 10/23/15 0911 10/24/15 0444  WBC 19.7* 18.8*  --   --  23.4* 28.5*  HGB 12.1 12.3  --   --  12.1 11.4*  HCT 37.0 36.8  --   --  36.1 33.9*  PLT 12* 14*  --   --  10* 16*  CREATININE 1.34*  1.34* 1.72* 2.06* 2.48*  --  2.95*  MG 1.6*  --   --  2.2  --  2.3  PHOS 1.9* 1.7* 2.8 2.8  --  3.2  ALBUMIN 1.8*  1.8* 1.8* 1.8* 1.7*  --  1.7*  PROT 4.9*  --   --   --   --   --   AST 428*  --   --   --   --   --   ALT 276*  --   --   --   --   --   ALKPHOS 209*  --   --   --   --   --   BILITOT 4.8*  --   --   --   --   --    Estimated Creatinine Clearance: 25.1 mL/min (by C-G formula based on Cr of 2.95).   Recent Labs  10/23/15 2342 10/24/15 0348 10/24/15 0753  GLUCAP 259* 193* 201*    Microbiology: Recent Results (from the past 720 hour(s))  Urine culture     Status: None   Collection Time: 10/20/15  9:57 AM  Result Value Ref Range Status   Specimen Description URINE, RANDOM  Final   Special Requests  NONE  Final   Culture >=100,000 COLONIES/mL PROTEUS MIRABILIS  Final   Report Status 10/22/2015 FINAL  Final   Organism ID, Bacteria PROTEUS MIRABILIS  Final      Susceptibility   Proteus mirabilis - MIC*    AMPICILLIN <=2 SENSITIVE Sensitive     GENTAMICIN <=1 SENSITIVE Sensitive     CEFTRIAXONE Value in next row Sensitive      SENSITIVE<=1    CIPROFLOXACIN Value in next row Sensitive      SENSITIVE<=0.25    IMIPENEM Value in next row Sensitive      SENSITIVE2    NITROFURANTOIN Value in next row Resistant      RESISTANT128    TRIMETH/SULFA Value in next row Sensitive      SENSITIVE<=20    PIP/TAZO Value in next row Sensitive      SENSITIVE<=4    AMPICILLIN/SULBACTAM Value in next row Sensitive      SENSITIVE<=2    * >=  100,000 COLONIES/mL PROTEUS MIRABILIS  Culture, blood (routine x 2)     Status: None   Collection Time: 10/20/15  9:57 AM  Result Value Ref Range Status   Specimen Description BLOOD RIGHT WRIST  Final   Special Requests   Final    BOTTLES DRAWN AEROBIC AND ANAEROBIC ANA 1ML AER 4ML   Culture  Setup Time   Final    GRAM NEGATIVE RODS IN BOTH AEROBIC AND ANAEROBIC BOTTLES CRITICAL RESULT CALLED TO, READ BACK BY AND VERIFIED WITH: MATT [place] ON 10/21/15 AT 0005 [facility] CONFIRMED BY [facility]    Culture   Final    PROTEUS MIRABILIS IN BOTH AEROBIC AND ANAEROBIC BOTTLES    Report Status 10/22/2015 FINAL  Final   Organism ID, Bacteria PROTEUS MIRABILIS  Final      Susceptibility   Proteus mirabilis - MIC*    AMPICILLIN/SULBACTAM Value in next row Sensitive      SENSITIVE<=2    PIP/TAZO Value in next row Sensitive      SENSITIVE<=4    CEFTRIAXONE Value in next row Sensitive      SENSITIVE<=1    IMIPENEM Value in next row Sensitive      SENSITIVE2    GENTAMICIN Value in next row Sensitive      SENSITIVE<=1    CIPROFLOXACIN Value in next row Sensitive      SENSITIVE<=0.25    * PROTEUS MIRABILIS  Culture, blood (routine x 2)     Status: None   Collection Time:  10/20/15  9:57 AM  Result Value Ref Range Status   Specimen Description BLOOD LEFT HAND  Final   Special Requests   Final    BOTTLES DRAWN AEROBIC AND ANAEROBIC AER 3ML ANA 2ML   Culture  Setup Time   Final    GRAM NEGATIVE RODS IN BOTH AEROBIC AND ANAEROBIC BOTTLES CRITICAL VALUE NOTED.  VALUE IS CONSISTENT WITH PREVIOUSLY REPORTED AND CALLED VALUE.    Culture   Final    PROTEUS MIRABILIS IN BOTH AEROBIC AND ANAEROBIC BOTTLES    Report Status 10/22/2015 FINAL  Final   Organism ID, Bacteria PROTEUS MIRABILIS  Final      Susceptibility   Proteus mirabilis - MIC*    AMPICILLIN Value in next row Sensitive      SENSITIVE<=2    PIP/TAZO Value in next row Sensitive      SENSITIVE<=4    CEFTAZIDIME Value in next row Sensitive      SENSITIVE<=1    CEFTRIAXONE Value in next row Sensitive      SENSITIVE<=1    CEFEPIME Value in next row Sensitive      SENSITIVE<=1    IMIPENEM Value in next row Sensitive      SENSITIVE2    GENTAMICIN Value in next row Sensitive      SENSITIVE<=1    CIPROFLOXACIN Value in next row Sensitive      SENSITIVE<=0.25    * PROTEUS MIRABILIS  Blood Culture ID Panel (Reflexed)     Status: Abnormal   Collection Time: 10/20/15  9:57 AM  Result Value Ref Range Status   Enterococcus species NOT DETECTED NOT DETECTED Final   Listeria monocytogenes NOT DETECTED NOT DETECTED Final   Staphylococcus species NOT DETECTED NOT DETECTED Final   Staphylococcus aureus NOT DETECTED NOT DETECTED Final   Streptococcus species NOT DETECTED NOT DETECTED Final   Streptococcus agalactiae NOT DETECTED NOT DETECTED Final   Streptococcus pneumoniae NOT DETECTED NOT DETECTED Final   Streptococcus pyogenes NOT  DETECTED NOT DETECTED Final   Acinetobacter baumannii NOT DETECTED NOT DETECTED Final   Enterobacteriaceae species DETECTED (A) NOT DETECTED Final    Comment: CRITICAL RESULT CALLED TO, READ BACK BY AND VERIFIED WITH: MATT MCBANE ON 10/21/15 AT 0005 [place]    Enterobacter  cloacae complex NOT DETECTED NOT DETECTED Final   Escherichia coli NOT DETECTED NOT DETECTED Final   Klebsiella oxytoca NOT DETECTED NOT DETECTED Final   Klebsiella pneumoniae NOT DETECTED NOT DETECTED Final   Proteus species (A) NOT DETECTED Final    CRITICAL RESULT CALLED TO, READ BACK BY AND VERIFIED WITH:    Comment: MATT MCBANE ON 10/21/15 AT 0005 [place]   Serratia marcescens NOT DETECTED NOT DETECTED Final   Haemophilus influenzae NOT DETECTED NOT DETECTED Final   Neisseria meningitidis NOT DETECTED NOT DETECTED Final   Pseudomonas aeruginosa NOT DETECTED NOT DETECTED Final   Candida albicans NOT DETECTED NOT DETECTED Final   Candida glabrata NOT DETECTED NOT DETECTED Final   Candida krusei NOT DETECTED NOT DETECTED Final   Candida parapsilosis NOT DETECTED NOT DETECTED Final   Candida tropicalis NOT DETECTED NOT DETECTED Final   Carbapenem resistance NOT DETECTED NOT DETECTED Final   Methicillin resistance NOT DETECTED NOT DETECTED Final   Vancomycin resistance NOT DETECTED NOT DETECTED Final  MRSA PCR Screening     Status: None   Collection Time: 10/20/15  7:41 PM  Result Value Ref Range Status   MRSA by PCR NEGATIVE NEGATIVE Final    Comment:        The GeneXpert MRSA Assay (FDA approved for NASAL specimens only), is one component of a comprehensive MRSA colonization surveillance program. It is not intended to diagnose MRSA infection nor to guide or monitor treatment for MRSA infections.   CULTURE, BLOOD (ROUTINE X 2) w Reflex to PCR ID Panel     Status: None (Preliminary result)   Collection Time: 10/22/15 11:39 AM  Result Value Ref Range Status   Specimen Description BLOOD LEFT HAND  Final   Special Requests BOTTLES DRAWN AEROBIC AND ANAEROBIC 1CC  Final   Culture NO GROWTH 2 DAYS  Final   Report Status PENDING  Incomplete  Culture, expectorated sputum-assessment     Status: None   Collection Time: 10/22/15 12:00 PM  Result Value Ref Range Status   Specimen  Description SPUTUM  Final   Special Requests Normal  Final   Sputum evaluation THIS SPECIMEN IS ACCEPTABLE FOR SPUTUM CULTURE  Final   Report Status 10/22/2015 FINAL  Final  Culture, respiratory (NON-Expectorated)     Status: None (Preliminary result)   Collection Time: 10/22/15 12:00 PM  Result Value Ref Range Status   Specimen Description SPUTUM  Final   Special Requests Normal Reflexed from [id number]  Final   Gram Stain   Final    FAIR SPECIMEN - 70-80% WBCS FEW WBC SEEN RARE YEAST RARE GRAM NEGATIVE RODS    Culture   Final    LIGHT GROWTH GRAM NEGATIVE RODS IDENTIFICATION AND SUSCEPTIBILITIES TO FOLLOW    Report Status PENDING  Incomplete  Urine culture     Status: None (Preliminary result)   Collection Time: 10/22/15 12:45 PM  Result Value Ref Range Status   Specimen Description URINE, RANDOM  Final   Special Requests NONE  Final   Culture NO GROWTH < 24 HOURS  Final   Report Status PENDING  Incomplete    Medications:  Scheduled:  . amiodarone  400 mg Oral BID  . antiseptic oral  rinse  7 mL Mouth Rinse 6 times per day  . chlorhexidine gluconate  15 mL Mouth/Throat BID  . diazepam  5 mg Intravenous 4 times per day  . feeding supplement (PRO-STAT SUGAR FREE 64)  30 mL Per Tube TID  . free water  100 mL Per Tube 3 times per day  . insulin aspart  0-15 Units Subcutaneous 6 times per day  . insulin aspart  5 Units Subcutaneous Q6H  . meropenem (MERREM) IV  1 g Intravenous Q12H  . pantoprazole sodium  40 mg Per Tube Q24H  . senna-docusate  2 tablet Oral BID   Infusions:  . dexmedetomidine    . feeding supplement (VITAL AF 1.2 CAL) 1,000 mL (10/23/15 1900)  . fentaNYL infusion INTRAVENOUS 150 mcg/hr (10/24/15 0736)  . norepinephrine (LEVOPHED) Adult infusion Stopped (10/23/15 0920)  . propofol (DIPRIVAN) infusion    . pureflow 2,000 mL/hr at 10/22/15 0104  . vasopressin (PITRESSIN) infusion - *FOR SHOCK* Stopped (10/23/15 7106)    Assessment: Pharmacy consulted to  adjust medications for 70 yo female ICU patient requiring mechanical ventilation and CRRT. Patient previously ordered heparin drip for NSTEMI, discontinued to due to thrombocytopenia.     Plan:  1. CRRT on hold due to thrombocytopenia. No further renal adjustments warranted at this time. MD Kolluru to order intermittent HD trial today.  2. 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 Now with HD, will adjust meropenem to '500mg'$  IV Q24hr after HD on HD days. Will follow cultures and adjust as appropriate.    3. Electrolytes are within normal limits. Will recheck electrolytes with am labs.   4. Will continue senna/docusate 2 tab VT BID and add bisacodyl suppository x1, as patient hasn't had a BM since admission.   Pharmacy will continue to monitor and adjust per consult.     Roe Coombs, PharmD Pharmacy Resident 10/24/2015

## 2015-10-24 NOTE — Progress Notes (Signed)
Pt has a bite block in place; however, is continuing to bite down on ET tube. This RN increased the fentanyl drip to 363mg. RT notified that I was having trouble suctioning due to pt biting tube. Pt more anxious and agitated, but receiving schedule Valium.

## 2015-10-24 NOTE — Progress Notes (Signed)
Pt running low grade fever again, ice packs placed. Will continue to monitor vitals.

## 2015-10-24 NOTE — Progress Notes (Signed)
PULMONARY / CRITICAL CARE MEDICINE   Name: April [name] MRN: [id number] DOB: 10-03-1945    ADMISSION DATE:  10/20/2015  Consulting physician - Dr. Fritzi Mandes  BRIEF HISTORY: 70 y.o. female with unknown past medical history was picked up by EMS today after being found at her house laying on the floor minimally responsive. Per coworkers she has not been to work for 2 days and when they checked on her they found her lying in the floor minimally responsive. EMS was called. Upon arrival to the ED, she only withdrew to pain, due to low GCS and inability to protect her airway she was intubated.   SUBJECTIVE:  Remains intubated, wean sedation, remains encephalopathy BUN 91 Patient weaned off pressors, had moderate agitation this morning, Platelets still significantly low, spoke with nephrology, CRRT on hold for another 24 hours.  Try HD today  STUDIES:  1/24 CT head and cspine - no acute findings 1/25 CT A/P - RIGHT hydronephrosis question UPJ obstruction; in the setting of leukocytosis, recommend correlation with urinalysis to exclude urinary tract infection.  SIGNIFICANT EVENTS: 1/24>>found down, brought to [facility] ED, intubated and started on CRRT 1/24>>RIJ Vascath 1/25>>LIJ CVL, placed by Vascular (Dr. Lucky Cowboy).   VITAL SIGNS: Temp:  [98.2 F (36.8 C)-100.6 F (38.1 C)] 100.2 F (37.9 C) (01/28 0700) Pulse Rate:  [66-81] 67 (01/28 0700) Resp:  [0-30] 30 (01/28 0700) BP: (93-123)/(52-89) 102/68 mmHg (01/28 0700) SpO2:  [96 %-100 %] 97 % (01/28 0700) FiO2 (%):  [30 %] 30 % (01/28 0805) Weight:  [283 lb 1.1 oz (128.4 kg)] 283 lb 1.1 oz (128.4 kg) (01/28 0408) HEMODYNAMICS:   VENTILATOR SETTINGS: Vent Mode:  [-] PRVC FiO2 (%):  [30 %] 30 % Set Rate:  [30 bmp] 30 bmp Vt Set:  [500 mL] 500 mL PEEP:  [5 cmH20] 5 cmH20 Plateau Pressure:  [22 cmH20-23 cmH20] 22 cmH20 INTAKE / OUTPUT:  Intake/Output Summary (Last 24 hours) at 10/24/15 0900 Last data filed at 10/24/15 0600  Gross per 24  hour  Intake 1533.59 ml  Output    520 ml  Net 1013.59 ml    Review of Systems  Unable to perform ROS: intubated    Physical Exam  Constitutional: She appears distressed.  HENT:  Head: Normocephalic and atraumatic.  Right Ear: External ear normal.  Left Ear: External ear normal.  Eyes: EOM are normal. Pupils are equal, round, and reactive to light.  Neck: Normal range of motion. No JVD present. No tracheal deviation present. No thyromegaly present.  Cardiovascular: Normal rate, regular rhythm and normal heart sounds.   No murmur heard. Pulmonary/Chest: She has no wheezes. She has no rales.  Intubated, coarse upper airway sounds  Abdominal: Soft. Bowel sounds are normal. She exhibits no distension.  Musculoskeletal: She exhibits edema. She exhibits no tenderness.  Neurological:  Intubated and sedated Withdraws from pain  Skin: Skin is dry.  Skin mottling has improved, now limited b/l legs, legs warm today  Nursing note and vitals reviewed.    LABS:  CBC  Recent Labs Lab 10/22/15 1323 10/23/15 0911 10/24/15 0444  WBC 18.8* 23.4* 28.5*  HGB 12.3 12.1 11.4*  HCT 36.8 36.1 33.9*  PLT 14* 10* 16*   Coag's  Recent Labs Lab 10/20/15 0957 10/21/15 0023  APTT 35  --   INR 1.46 1.52   BMET  Recent Labs Lab 10/22/15 2053 10/23/15 0449 10/24/15 0444  NA 139 138 138  K 3.7 3.6 3.7  CL 100* 100* 103  CO2 '26 25 26  '$ BUN 49* 60* 91*  CREATININE 2.06* 2.48* 2.95*  GLUCOSE 199* 246* 238*   Electrolytes  Recent Labs Lab 10/22/15 0503  10/22/15 2053 10/23/15 0449 10/24/15 0444  CALCIUM 7.7*  7.6*  < > 7.8* 7.7* 7.5*  MG 1.6*  --   --  2.2 2.3  PHOS 1.9*  < > 2.8 2.8 3.2  < > = values in this interval not displayed. Sepsis Markers  Recent Labs Lab 10/20/15 1254 10/21/15 0515 10/21/15 1753  LATICACIDVEN 5.9* 3.7* 2.9*   ABG  Recent Labs Lab 10/20/15 2035 10/21/15 0847  PHART 7.13* 7.35  PCO2ART 41 33  PO2ART 103 193*   Liver  Enzymes  Recent Labs Lab 10/20/15 0957 10/21/15 0515  10/22/15 0503  10/22/15 2053 10/23/15 0449 10/24/15 0444  AST 124* 312*  --  428*  --   --   --   --   ALT 63* 176*  --  276*  --   --   --   --   ALKPHOS 567* 391*  --  209*  --   --   --   --   BILITOT 2.5* 3.1*  --  4.8*  --   --   --   --   ALBUMIN 3.1* 2.1*  < > 1.8*  1.8*  < > 1.8* 1.7* 1.7*  < > = values in this interval not displayed. Cardiac Enzymes  Recent Labs Lab 10/20/15 2242 10/21/15 1755 10/22/15 0106  TROPONINI 1.08* 0.44* 0.27*   Glucose  Recent Labs Lab 10/23/15 1200 10/23/15 1708 10/23/15 1938 10/23/15 2342 10/24/15 0348 10/24/15 0753  GLUCAP 323* 259* 264* 259* 193* 201*    Imaging Dg Chest Port 1 View  10/23/2015  CLINICAL DATA:  Respiratory difficulty EXAM: PORTABLE CHEST 1 VIEW COMPARISON:  10/21/2015 FINDINGS: Endotracheal and NG tubes are stable. Left jugular venous catheter with its tip in the upper SVC is stable. Right jugular dialysis catheter with its tip in the upper SVC is stable. Lungs remain under aerated with central and basilar atelectasis. Mild vascular congestion is stable. No pneumothorax. IMPRESSION: Stable tubular devices Stable vascular congestion and bibasilar atelectasis. Electronically Signed   By: Marybelle Killings M.D.   On: 10/23/2015 12:42    Cultures: BCx2 x2 1/24>>Proteus UC 1/24>>Proteus Sputum 1/26>> BC x 2 1/26>> UC 1/26>>  Antibiotics: Vanc 1/24>>1/25 Zosyn 1/24>>1/25 Ceftriaxone 1/25>>1/27 Meropenem   Lines: RIJ Vascath 1/24>> LIJ CVL 1/25>>  ASSESSMENT / PLAN: 70 yo Female admitted for acute resp failure from acute septic shock with  NSTEMI, with proteus bacteremia  PULMONARY OETT 7.86m Respiratory failure - hypoxic Septic shock -slowly  improving P:  - cont with MV - prn ABG - wean MV as tolerated - maintain sats >90% - WUA\SBT daily - wean fentanyl, started valium  CARDIOVASCULAR CVL - RIJ VASCATH, LIJ CVL Shock -  septic hypotension NSTEMI Afib - started on amiodarone 1/24  P:  - maintain MAP >65 - vasopressors as needed - currently weaned off.  - monitor BP - trend CE - heparin gtt on hold due to low plt - mostly likely demand ischemia - EKG reviewed, no sig ST changes.  - cont with amiodarone - cardiology consult appreciated.   RENAL Anuria ARF UTI ?Rhabdomyolysis - CK improving, unlikely rhabdo Right hydronephrosis Proteus Bacteremia, Proteus UTI P:  -monitor Cr -HD today - follow up Ur Cx -current CK trending down, cont to follow, IVF -Etoh, salicylate levels normal  - U/S of  Right Kidney with mild R hydro, WBC improving, pressors are being wean down  -CT A/P was done prior to foley placement, bladder is now decompressed.  - meropenem in this severe septic patient.   GASTROINTESTINAL SUP - PPI Elevated LFTs - trend TF  HEMATOLOGIC Leukocytosis Thrombocytopenia - lower today P:  -most likely related to septic shock - source urine, now with Proteus Bacteremia -follow CBC -cont abx -stopped heparin 1/24 -CRRT on hold for 24 hrs again, reevaluate on 1/28 AM  INFECTIOUS Sepsis - Proteus UTI and Bacteremia Still spiking fevers on antibiotics P:  - cont with current abx - Bld and UC Cx with Protues, narrowed abx to ceftriaxone on 1/25 - broaden abx to meropenem on 1/27  ENDOCRINE ICU hypo/hyperglycemia protocol  NEUROLOGIC A: Acute encephalopathy P:  RASS goal: -1 - metabolic encephalopathy - cont to monitor neuro status.   I have personally obtained a history, examined the patient, evaluated laboratory and imaging results, formulated the assessment and plan and placed orders.  The Patient requires high complexity decision making for assessment and support, frequent evaluation and titration of therapies, application of advanced monitoring technologies and extensive interpretation of multiple databases. Critical Care Time devoted to patient care services  described in this note is 35 minutes.   Overall, patient is critically ill, prognosis is guarded. Patient at high risk for cardiac arrest and death.  I have personally obtained a history, examined the patient, evaluated Pertinent laboratory and [employer]/imaging results, and  formulated the assessment and plan  Corrin Parker, M.D.  [facility]  Medical Director [place] Director [facility] Department

## 2015-10-24 NOTE — Progress Notes (Signed)
Spoke with Dr. Oletta Darter from [facility] regarding pt. He does not feel comfortable providing the family with an update without being somewhat familiar with the patient. He stated he would contact Dr. Mortimer Fries personally and tell him he must be the one to update. Response, Dr. Mortimer Fries plans to meet with family and co-worker tomorrow morning to discuss further steps, and who is allowed to be informed about the pt. I will put in a care management consult to be involve with this case.

## 2015-10-24 NOTE — Progress Notes (Signed)
[facility] [place] NAME: April [name]    MR#:  [id number]  DATE OF BIRTH:  Dec 24, 1945  SUBJECTIVE:   Remains Critically ill. Off vasopressors.  A bit more awake.  Plt count still low. To start intermittent HD today.   REVIEW OF SYSTEMS:   Review of Systems  Unable to perform ROS: intubated   Tolerating Diet:TF Tolerating PT: intubated  DRUG ALLERGIES:   Allergies  Allergen Reactions  . Prednisone Anaphylaxis    VITALS:  Blood pressure 131/85, pulse 76, temperature 99.1 F (37.3 C), temperature source Core (Comment), resp. rate 30, height '5\' 7"'$  (1.702 m), weight 128.4 kg (283 lb 1.1 oz), SpO2 95 %.  PHYSICAL EXAMINATION:   Physical Exam  GENERAL:  70 y.o.-year-old patient lying in the bed critically ill, intubated and on the vent no acute distress. Obese EYES: Pupils equal, round, reactive to light and accommodation. ++ scleral icterus. Extraocular muscles intact.  HEENT: Head atraumatic, normocephalic. Oropharynx and nasopharynx clear. intubated and on the vent NECK:  Supple, no jugular venous distention. No thyroid enlargement, no tenderness.  LUNGS: distant breath sounds bilaterally, no wheezing, rales, rhonchi. No use of accessory muscles of respiration.  CARDIOVASCULAR: S1, S2 normal. No murmurs, rubs, or gallops.  ABDOMEN: Soft, nontender, nondistended. Bowel sounds present. No organomegaly or mass. + foley with yellow urine draining.  EXTREMITIES: feet now warm to touch. Peripheral pulses dopplerable per RN NEUROLOGIC: on the vent PSYCHIATRIC: on the vent SKIN: severe bilateral LE mottling++  LABORATORY PANEL:  CBC  Recent Labs Lab 10/24/15 0444  WBC 28.5*  HGB 11.4*  HCT 33.9*  PLT 16*    Chemistries   Recent Labs Lab 10/22/15 0503  10/24/15 0444  NA 137  137  < > 138  K 4.0  4.0  < > 3.7  CL 101  102  < > 103  CO2 26  24  < > 26  GLUCOSE 364*  356*  < > 238*  BUN 35*  34*  < > 91*   CREATININE 1.34*  1.34*  < > 2.95*  CALCIUM 7.7*  7.6*  < > 7.5*  MG 1.6*  < > 2.3  AST 428*  --   --   ALT 276*  --   --   ALKPHOS 209*  --   --   BILITOT 4.8*  --   --   < > = values in this interval not displayed. Cardiac Enzymes  Recent Labs Lab 10/22/15 0106  TROPONINI 0.27*   RADIOLOGY:  Dg Chest Port 1 View  10/23/2015  CLINICAL DATA:  Respiratory difficulty EXAM: PORTABLE CHEST 1 VIEW COMPARISON:  10/21/2015 FINDINGS: Endotracheal and NG tubes are stable. Left jugular venous catheter with its tip in the upper SVC is stable. Right jugular dialysis catheter with its tip in the upper SVC is stable. Lungs remain under aerated with central and basilar atelectasis. Mild vascular congestion is stable. No pneumothorax. IMPRESSION: Stable tubular devices Stable vascular congestion and bibasilar atelectasis. Electronically Signed   By: Marybelle Killings M.D.   On: 10/23/2015 12:42   ASSESSMENT AND PLAN:   70 year old female presented to the emergency room after she was found lying on the floor with minimal responsiveness.  1. Severe Septic shock, hypovolemic shock: due to proteus bacteremia,  source urine  -Patient's currently intubated on ventilator Day 5 - off vasopressors now.   - Continue IV Meropenem and off steroids now.  -  blood  and urine culture growing Proteus Mirabilis  2. Acute renal failure: - is due to ATN in the setting of sepsis and rhabdomyolysis. Patient was anuric but that's improving.  - was on CRRT but it was stopped due to thrombocytopenia.  - now to start HD today and will monitor.  Cont. Care as per Nephro.  -Appreciate nephrology consultation.  3. Severe leukocytosis: This is due to usepsis and also likely due to dehydration/anuria. - improving w/ IV abx and will cont. To monitor.  - afebrile presently.   4. Atrial fibrillation with RVR - rate controlled now.  Off amio gtt and now on Oral amio.  - Echo showing normal LV function w/ EF of 55%.    5. DM-2  , new onset -HbA1c of 7.7 -No further episodes of hypoglycemia. Continue insulin every 4 hours and also sliding scale insulin for now.  6. Elevated transaminases and jaundice -suspect Shock liver and LFT's improving and will monitor.   7. Acute rhabdomyolysis secondary to patient being found down on the floor:  - CPK's have trended down.   8. Severe TCP came in with 62---46--23--12--14--> 16 today.  - likely consumption pf plts given severe sepsis ,?DIC, ? Related to CRRT which was stopped.  -no active bleeding and follow Plt count.   She remains critically ill with multiorgan failure.  CODE STATUS: full  DVT Prophylaxis: SCD/TED  TOTAL critical TIME TAKING CARE OF THIS PATIENT: 30 minutes.   Note: This dictation was prepared with Dragon dictation along with smaller phrase technology. Any transcriptional errors that result from this process are unintentional.  Henreitta Leber M.D on 10/24/2015 at 4:09 PM  Between 7am to 6pm - Pager - [phone]  After 6pm go to [email or web address] - password [email or web address] [facility]  Office  [phone]  CC: Primary care physician; Albina Billet, MD

## 2015-10-25 DIAGNOSIS — J9690 Respiratory failure, unspecified, unspecified whether with hypoxia or hypercapnia: Secondary | ICD-10-CM

## 2015-10-25 LAB — BLOOD GAS, ARTERIAL
ACID-BASE EXCESS: 4.3 mmol/L — AB (ref 0.0–3.0)
Bicarbonate: 26 mEq/L (ref 21.0–28.0)
FIO2: 0.6
MECHANICAL RATE: 25
MECHVT: 550 mL
O2 Saturation: 98.3 %
PEEP/CPAP: 5 cmH2O
PO2 ART: 96 mmHg (ref 83.0–108.0)
Patient temperature: 37
pCO2 arterial: 29 mmHg — ABNORMAL LOW (ref 32.0–48.0)
pH, Arterial: 7.56 — ABNORMAL HIGH (ref 7.350–7.450)

## 2015-10-25 LAB — CBC
HCT: 31.8 % — ABNORMAL LOW (ref 35.0–47.0)
HEMOGLOBIN: 10.8 g/dL — AB (ref 12.0–16.0)
MCH: 29.8 pg (ref 26.0–34.0)
MCHC: 34 g/dL (ref 32.0–36.0)
MCV: 87.5 fL (ref 80.0–100.0)
PLATELETS: 36 10*3/uL — AB (ref 150–440)
RBC: 3.64 MIL/uL — AB (ref 3.80–5.20)
RDW: 14.4 % (ref 11.5–14.5)
WBC: 25.4 10*3/uL — ABNORMAL HIGH (ref 3.6–11.0)

## 2015-10-25 LAB — GLUCOSE, CAPILLARY
GLUCOSE-CAPILLARY: 199 mg/dL — AB (ref 65–99)
GLUCOSE-CAPILLARY: 161 mg/dL — AB (ref 65–99)
Glucose-Capillary: 162 mg/dL — ABNORMAL HIGH (ref 65–99)
Glucose-Capillary: 165 mg/dL — ABNORMAL HIGH (ref 65–99)
Glucose-Capillary: 199 mg/dL — ABNORMAL HIGH (ref 65–99)
Glucose-Capillary: 210 mg/dL — ABNORMAL HIGH (ref 65–99)
Glucose-Capillary: 224 mg/dL — ABNORMAL HIGH (ref 65–99)

## 2015-10-25 LAB — BASIC METABOLIC PANEL
Anion gap: 10 (ref 5–15)
BUN: 82 mg/dL — AB (ref 6–20)
CHLORIDE: 104 mmol/L (ref 101–111)
CO2: 28 mmol/L (ref 22–32)
CREATININE: 2.36 mg/dL — AB (ref 0.44–1.00)
Calcium: 7.5 mg/dL — ABNORMAL LOW (ref 8.9–10.3)
GFR, EST AFRICAN AMERICAN: 23 mL/min — AB (ref >60)
GFR, EST NON AFRICAN AMERICAN: 20 mL/min — AB (ref >60)
Glucose, Bld: 231 mg/dL — ABNORMAL HIGH (ref 65–99)
Potassium: 2.9 mmol/L — CL (ref 3.5–5.1)
SODIUM: 142 mmol/L (ref 135–145)

## 2015-10-25 LAB — POTASSIUM: Potassium: 3.3 mmol/L — ABNORMAL LOW (ref 3.5–5.1)

## 2015-10-25 MED ORDER — ANTISEPTIC ORAL RINSE SOLUTION (CORINZ)
7.0000 mL | OROMUCOSAL | Status: DC
Start: 2015-10-26 — End: 2015-10-30
  Administered 2015-10-26 – 2015-10-30 (×42): 7 mL via OROMUCOSAL
  Filled 2015-10-25 (×54): qty 7

## 2015-10-25 MED ORDER — IPRATROPIUM-ALBUTEROL 0.5-2.5 (3) MG/3ML IN SOLN
3.0000 mL | Freq: Four times a day (QID) | RESPIRATORY_TRACT | Status: DC
  Administered 2015-10-26 – 2015-11-08 (×52): 3 mL via RESPIRATORY_TRACT
  Filled 2015-10-25 (×55): qty 3

## 2015-10-25 MED ORDER — MIDAZOLAM BOLUS VIA INFUSION
2.0000 mg | INTRAVENOUS | Status: DC | PRN
  Filled 2015-10-25: qty 4

## 2015-10-25 MED ORDER — CHLORHEXIDINE GLUCONATE 0.12% ORAL RINSE (MEDLINE KIT)
15.0000 mL | Freq: Two times a day (BID) | OROMUCOSAL | Status: DC
  Administered 2015-10-26 – 2015-11-01 (×12): 15 mL via OROMUCOSAL
  Filled 2015-10-25 (×15): qty 15

## 2015-10-25 MED ORDER — POTASSIUM CHLORIDE 10 MEQ/100ML IV SOLN
10.0000 meq | INTRAVENOUS | Status: AC
  Administered 2015-10-25 (×3): 10 meq via INTRAVENOUS
  Filled 2015-10-25 (×3): qty 100

## 2015-10-25 MED ORDER — POTASSIUM CHLORIDE 20 MEQ PO PACK
20.0000 meq | PACK | Freq: Three times a day (TID) | ORAL | Status: DC
  Administered 2015-10-25: 20 meq
  Filled 2015-10-25: qty 1

## 2015-10-25 NOTE — Progress Notes (Signed)
Spoke to Margaree Mackintosh MD on the phone about patient's potassium value of 3.3 after 50 meq today (20 meq per OG tube and 30 meq IV piggyback). Due to patient's renal status MD did not order more replacement at this time. Patient to get potassium checked again with tomorrow morning's labs.

## 2015-10-25 NOTE — Progress Notes (Addendum)
[facility] NOTE  Pharmacy Consult for CRRT/HD medication management, Meropenem Dosing, Electrolyte Dosing, Constipation Prevention Indication: CRRT (now HD)/ Proteus Bacteremia     Allergies  Allergen Reactions  . Prednisone Anaphylaxis    Patient Measurements: Height: '5\' 7"'$  (170.2 cm) Weight: 288 lb 9.3 oz (130.9 kg) IBW/kg (Calculated) : 61.6   Vital Signs: Temp: 99.9 F (37.7 C) (01/29 1800) BP: 104/52 mmHg (01/29 1800) Pulse Rate: 72 (01/29 1800) Intake/Output from previous day: 01/28 0701 - 01/29 0700 In: 2385.8 [I.V.:735.8; NG/GT:1550; IV Piggyback:100] Out: 1580 [Urine:1580] Intake/Output from this shift:   Vent settings for last 24 hours: Vent Mode:  [-] PRVC FiO2 (%):  [30 %] 30 % Set Rate:  [30 bmp] 30 bmp Vt Set:  [500 mL] 500 mL PEEP:  [5 cmH20] 5 cmH20  Labs:  Recent Labs  10/22/15 2053 10/23/15 0449 10/23/15 0911 10/24/15 0444 10/25/15 0414  WBC  --   --  23.4* 28.5* 25.4*  HGB  --   --  12.1 11.4* 10.8*  HCT  --   --  36.1 33.9* 31.8*  PLT  --   --  10* 16* 36*  CREATININE 2.06* 2.48*  --  2.95* 2.36*  MG  --  2.2  --  2.3  --   PHOS 2.8 2.8  --  3.2  --   ALBUMIN 1.8* 1.7*  --  1.7*  --    Estimated Creatinine Clearance: 31.7 mL/min (by C-G formula based on Cr of 2.36).   Recent Labs  10/25/15 0803 10/25/15 1142 10/25/15 1538  GLUCAP 210* 199* 165*    Microbiology: Recent Results (from the past 720 hour(s))  Urine culture     Status: None   Collection Time: 10/20/15  9:57 AM  Result Value Ref Range Status   Specimen Description URINE, RANDOM  Final   Special Requests NONE  Final   Culture >=100,000 COLONIES/mL PROTEUS MIRABILIS  Final   Report Status 10/22/2015 FINAL  Final   Organism ID, Bacteria PROTEUS MIRABILIS  Final      Susceptibility   Proteus mirabilis - MIC*    AMPICILLIN <=2 SENSITIVE Sensitive     GENTAMICIN <=1 SENSITIVE Sensitive     CEFTRIAXONE Value in next row Sensitive    SENSITIVE<=1    CIPROFLOXACIN Value in next row Sensitive      SENSITIVE<=0.25    IMIPENEM Value in next row Sensitive      SENSITIVE2    NITROFURANTOIN Value in next row Resistant      RESISTANT128    TRIMETH/SULFA Value in next row Sensitive      SENSITIVE<=20    PIP/TAZO Value in next row Sensitive      SENSITIVE<=4    AMPICILLIN/SULBACTAM Value in next row Sensitive      SENSITIVE<=2    * >=100,000 COLONIES/mL PROTEUS MIRABILIS  Culture, blood (routine x 2)     Status: None   Collection Time: 10/20/15  9:57 AM  Result Value Ref Range Status   Specimen Description BLOOD RIGHT WRIST  Final   Special Requests   Final    BOTTLES DRAWN AEROBIC AND ANAEROBIC ANA 1ML AER 4ML   Culture  Setup Time   Final    GRAM NEGATIVE RODS IN BOTH AEROBIC AND ANAEROBIC BOTTLES CRITICAL RESULT CALLED TO, READ BACK BY AND VERIFIED WITH: MATT MCBANE ON 10/21/15 AT 0005 [facility] CONFIRMED BY [facility]    Culture   Final    PROTEUS MIRABILIS IN BOTH AEROBIC  AND ANAEROBIC BOTTLES    Report Status 10/22/2015 FINAL  Final   Organism ID, Bacteria PROTEUS MIRABILIS  Final      Susceptibility   Proteus mirabilis - MIC*    AMPICILLIN/SULBACTAM Value in next row Sensitive      SENSITIVE<=2    PIP/TAZO Value in next row Sensitive      SENSITIVE<=4    CEFTRIAXONE Value in next row Sensitive      SENSITIVE<=1    IMIPENEM Value in next row Sensitive      SENSITIVE2    GENTAMICIN Value in next row Sensitive      SENSITIVE<=1    CIPROFLOXACIN Value in next row Sensitive      SENSITIVE<=0.25    * PROTEUS MIRABILIS  Culture, blood (routine x 2)     Status: None   Collection Time: 10/20/15  9:57 AM  Result Value Ref Range Status   Specimen Description BLOOD LEFT HAND  Final   Special Requests   Final    BOTTLES DRAWN AEROBIC AND ANAEROBIC AER 3ML ANA 2ML   Culture  Setup Time   Final    GRAM NEGATIVE RODS IN BOTH AEROBIC AND ANAEROBIC BOTTLES CRITICAL VALUE NOTED.  VALUE IS CONSISTENT WITH PREVIOUSLY REPORTED  AND CALLED VALUE.    Culture   Final    PROTEUS MIRABILIS IN BOTH AEROBIC AND ANAEROBIC BOTTLES    Report Status 10/22/2015 FINAL  Final   Organism ID, Bacteria PROTEUS MIRABILIS  Final      Susceptibility   Proteus mirabilis - MIC*    AMPICILLIN Value in next row Sensitive      SENSITIVE<=2    PIP/TAZO Value in next row Sensitive      SENSITIVE<=4    CEFTAZIDIME Value in next row Sensitive      SENSITIVE<=1    CEFTRIAXONE Value in next row Sensitive      SENSITIVE<=1    CEFEPIME Value in next row Sensitive      SENSITIVE<=1    IMIPENEM Value in next row Sensitive      SENSITIVE2    GENTAMICIN Value in next row Sensitive      SENSITIVE<=1    CIPROFLOXACIN Value in next row Sensitive      SENSITIVE<=0.25    * PROTEUS MIRABILIS  Blood Culture ID Panel (Reflexed)     Status: Abnormal   Collection Time: 10/20/15  9:57 AM  Result Value Ref Range Status   Enterococcus species NOT DETECTED NOT DETECTED Final   Listeria monocytogenes NOT DETECTED NOT DETECTED Final   Staphylococcus species NOT DETECTED NOT DETECTED Final   Staphylococcus aureus NOT DETECTED NOT DETECTED Final   Streptococcus species NOT DETECTED NOT DETECTED Final   Streptococcus agalactiae NOT DETECTED NOT DETECTED Final   Streptococcus pneumoniae NOT DETECTED NOT DETECTED Final   Streptococcus pyogenes NOT DETECTED NOT DETECTED Final   Acinetobacter baumannii NOT DETECTED NOT DETECTED Final   Enterobacteriaceae species DETECTED (A) NOT DETECTED Final    Comment: CRITICAL RESULT CALLED TO, READ BACK BY AND VERIFIED WITH: MATT MCBANE ON 10/21/15 AT 0005 [place]    Enterobacter cloacae complex NOT DETECTED NOT DETECTED Final   Escherichia coli NOT DETECTED NOT DETECTED Final   Klebsiella oxytoca NOT DETECTED NOT DETECTED Final   Klebsiella pneumoniae NOT DETECTED NOT DETECTED Final   Proteus species (A) NOT DETECTED Final    CRITICAL RESULT CALLED TO, READ BACK BY AND VERIFIED WITH:    Comment: MATT MCBANE ON  10/21/15 AT 0005 [facility]  Serratia marcescens NOT DETECTED NOT DETECTED Final   Haemophilus influenzae NOT DETECTED NOT DETECTED Final   Neisseria meningitidis NOT DETECTED NOT DETECTED Final   Pseudomonas aeruginosa NOT DETECTED NOT DETECTED Final   Candida albicans NOT DETECTED NOT DETECTED Final   Candida glabrata NOT DETECTED NOT DETECTED Final   Candida krusei NOT DETECTED NOT DETECTED Final   Candida parapsilosis NOT DETECTED NOT DETECTED Final   Candida tropicalis NOT DETECTED NOT DETECTED Final   Carbapenem resistance NOT DETECTED NOT DETECTED Final   Methicillin resistance NOT DETECTED NOT DETECTED Final   Vancomycin resistance NOT DETECTED NOT DETECTED Final  MRSA PCR Screening     Status: None   Collection Time: 10/20/15  7:41 PM  Result Value Ref Range Status   MRSA by PCR NEGATIVE NEGATIVE Final    Comment:        The GeneXpert MRSA Assay (FDA approved for NASAL specimens only), is one component of a comprehensive MRSA colonization surveillance program. It is not intended to diagnose MRSA infection nor to guide or monitor treatment for MRSA infections.   CULTURE, BLOOD (ROUTINE X 2) w Reflex to PCR ID Panel     Status: None (Preliminary result)   Collection Time: 10/22/15 11:39 AM  Result Value Ref Range Status   Specimen Description BLOOD LEFT HAND  Final   Special Requests BOTTLES DRAWN AEROBIC AND ANAEROBIC 1CC  Final   Culture NO GROWTH 3 DAYS  Final   Report Status PENDING  Incomplete  Culture, expectorated sputum-assessment     Status: None   Collection Time: 10/22/15 12:00 PM  Result Value Ref Range Status   Specimen Description SPUTUM  Final   Special Requests Normal  Final   Sputum evaluation THIS SPECIMEN IS ACCEPTABLE FOR SPUTUM CULTURE  Final   Report Status 10/22/2015 FINAL  Final  Culture, respiratory (NON-Expectorated)     Status: None   Collection Time: 10/22/15 12:00 PM  Result Value Ref Range Status   Specimen Description SPUTUM  Final    Special Requests Normal Reflexed from [id number]  Final   Gram Stain   Final    FAIR SPECIMEN - 70-80% WBCS FEW WBC SEEN RARE YEAST RARE GRAM NEGATIVE RODS    Culture LIGHT GROWTH ENTEROBACTER AEROGENES  Final   Report Status 10/24/2015 FINAL  Final   Organism ID, Bacteria ENTEROBACTER AEROGENES  Final      Susceptibility   Enterobacter aerogenes - MIC*    CEFAZOLIN >=64 RESISTANT Resistant     CEFEPIME <=1 SENSITIVE Sensitive     CEFTAZIDIME <=1 SENSITIVE Sensitive     CEFTRIAXONE <=1 SENSITIVE Sensitive     CIPROFLOXACIN <=0.25 SENSITIVE Sensitive     GENTAMICIN <=1 SENSITIVE Sensitive     IMIPENEM 2 SENSITIVE Sensitive     TRIMETH/SULFA <=20 SENSITIVE Sensitive     PIP/TAZO <=4 SENSITIVE Sensitive     * LIGHT GROWTH ENTEROBACTER AEROGENES  Urine culture     Status: None   Collection Time: 10/22/15 12:45 PM  Result Value Ref Range Status   Specimen Description URINE, RANDOM  Final   Special Requests NONE  Final   Culture NO GROWTH 2 DAYS  Final   Report Status 10/24/2015 FINAL  Final    Medications:  Scheduled:  . amiodarone  400 mg Oral BID  . antiseptic oral rinse  7 mL Mouth Rinse QID  . chlorhexidine gluconate  15 mL Mouth Rinse BID  . feeding supplement (PRO-STAT SUGAR FREE 64)  30 mL Per  Tube TID  . free water  100 mL Per Tube 3 times per day  . insulin aspart  0-15 Units Subcutaneous 6 times per day  . insulin aspart  5 Units Subcutaneous Q4H  . meropenem (MERREM) IV  500 mg Intravenous Q24H  . pantoprazole sodium  40 mg Per Tube Q24H  . senna-docusate  2 tablet Oral BID   Infusions:  . dexmedetomidine 0.5 mcg/kg/hr (10/25/15 1815)  . feeding supplement (VITAL AF 1.2 CAL) 1,000 mL (10/25/15 1600)  . fentaNYL infusion INTRAVENOUS 400 mcg/hr (10/25/15 1310)  . norepinephrine (LEVOPHED) Adult infusion Stopped (10/25/15 1600)  . propofol (DIPRIVAN) infusion    . pureflow 2,000 mL/hr at 10/22/15 0104  . vasopressin (PITRESSIN) infusion - *FOR SHOCK* Stopped  (10/23/15 2395)    Assessment: Pharmacy consulted to adjust medications for 70 yo female ICU patient requiring mechanical ventilation and CRRT. Patient previously ordered heparin drip for NSTEMI, discontinued to due to thrombocytopenia.     Plan:  1. CRRT on hold due to thrombocytopenia. No further renal adjustments warranted at this time. MD Kolluru to order intermittent HD trial today. No medications require adjustment at present.   2. 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 Now with HD, will continue meropenem to '500mg'$  IV Q24hr after HD on HD days. Will follow cultures and adjust as appropriate.    3. Potassium of 3.3 after replacement this AM. Per RN note, [facility]. Did not want additional supplementation this evening due to renal function. Will follow up with labs in the AM   4. Patient with BM this am. Will continue senna/docusate 2 tabs bid.   Pharmacy will continue to monitor and adjust per consult.    Ramond Dial, Pharm.D Clinical Pharmacist  10/25/2015

## 2015-10-25 NOTE — Progress Notes (Deleted)
[facility] Progress Note Patient Name: April [name] DOB: 1946-08-16 MRN: [id number]   Date of Service  10/25/2015  HPI/Events of Note  Hypokalemia  eICU Interventions  Potassium replaced     Intervention Category Intermediate Interventions: Electrolyte abnormality - evaluation and management  [name] 10/25/2015, 4:58 AM

## 2015-10-25 NOTE — Progress Notes (Signed)
PULMONARY / CRITICAL CARE MEDICINE   Name: April [name] MRN: [id number] DOB: Feb 11, 1946    ADMISSION DATE:  10/20/2015  Consulting physician - Dr. Fritzi Mandes  BRIEF HISTORY: 70 y.o. female with unknown past medical history was picked up by EMS today after being found at her house laying on the floor minimally responsive. Per coworkers she has not been to work for 2 days and when they checked on her they found her lying in the floor minimally responsive. EMS was called. Upon arrival to the ED, she only withdrew to pain, due to low GCS and inability to protect her airway she was intubated.   SUBJECTIVE:  Remains intubated, wean sedation, remains encephalopathy BUN 91 Discussed with nephrology. We will hold on CRRT. Mental status remains reduced today.  STUDIES:  1/24 CT head and cspine - no acute findings 1/25 CT A/P - RIGHT hydronephrosis question UPJ obstruction; in the setting of leukocytosis, recommend correlation with urinalysis to exclude urinary tract infection.  SIGNIFICANT EVENTS: 1/24>>found down, brought to [facility] ED, intubated and started on CRRT 1/24>>RIJ Vascath 1/25>>LIJ CVL, placed by Vascular (Dr. Lucky Cowboy).   VITAL SIGNS: Temp:  [98.1 F (36.7 C)-100.2 F (37.9 C)] 99.1 F (37.3 C) (01/29 1300) Pulse Rate:  [56-87] 63 (01/29 1300) Resp:  [14-30] 27 (01/29 1300) BP: (85-128)/(46-96) 103/66 mmHg (01/29 1300) SpO2:  [86 %-98 %] 98 % (01/29 1300) FiO2 (%):  [30 %] 30 % (01/29 1300) Weight:  [288 lb 9.3 oz (130.9 kg)] 288 lb 9.3 oz (130.9 kg) (01/29 0441) HEMODYNAMICS:   VENTILATOR SETTINGS: Vent Mode:  [-] PRVC FiO2 (%):  [30 %] 30 % Set Rate:  [30 bmp] 30 bmp Vt Set:  [500 mL] 500 mL PEEP:  [5 cmH20] 5 cmH20 INTAKE / OUTPUT:  Intake/Output Summary (Last 24 hours) at 10/25/15 1640 Last data filed at 10/25/15 1400  Gross per 24 hour  Intake 2466.93 ml  Output   1555 ml  Net 911.93 ml    Review of Systems  Unable to perform ROS: intubated    Physical Exam   Constitutional: She appears distressed.  HENT:  Head: Normocephalic and atraumatic.  Right Ear: External ear normal.  Left Ear: External ear normal.  Eyes: EOM are normal. Pupils are equal, round, and reactive to light.  Neck: Normal range of motion. No JVD present. No tracheal deviation present. No thyromegaly present.  Cardiovascular: Normal rate, regular rhythm and normal heart sounds.   No murmur heard. Pulmonary/Chest: She has no wheezes. She has no rales.  Intubated, coarse upper airway sounds  Abdominal: Soft. Bowel sounds are normal. She exhibits no distension.  Musculoskeletal: She exhibits edema. She exhibits no tenderness.  Neurological:  Intubated and sedated Withdraws from pain  Skin: Skin is dry.  Skin mottling has improved, now limited b/l legs, legs warm today  Nursing note and vitals reviewed.    LABS:  CBC  Recent Labs Lab 10/23/15 0911 10/24/15 0444 10/25/15 0414  WBC 23.4* 28.5* 25.4*  HGB 12.1 11.4* 10.8*  HCT 36.1 33.9* 31.8*  PLT 10* 16* 36*   Coag's  Recent Labs Lab 10/20/15 0957 10/21/15 0023  APTT 35  --   INR 1.46 1.52   BMET  Recent Labs Lab 10/23/15 0449 10/24/15 0444 10/25/15 0414  NA 138 138 142  K 3.6 3.7 2.9*  CL 100* 103 104  CO2 '25 26 28  '$ BUN 60* 91* 82*  CREATININE 2.48* 2.95* 2.36*  GLUCOSE 246* 238* 231*   Electrolytes  Recent Labs Lab 10/22/15 0503  10/22/15 2053 10/23/15 0449 10/24/15 0444 10/25/15 0414  CALCIUM 7.7*  7.6*  < > 7.8* 7.7* 7.5* 7.5*  MG 1.6*  --   --  2.2 2.3  --   PHOS 1.9*  < > 2.8 2.8 3.2  --   < > = values in this interval not displayed. Sepsis Markers  Recent Labs Lab 10/20/15 1254 10/21/15 0515 10/21/15 1753  LATICACIDVEN 5.9* 3.7* 2.9*   ABG  Recent Labs Lab 10/20/15 2035 10/21/15 0847  PHART 7.13* 7.35  PCO2ART 41 33  PO2ART 103 193*   Liver Enzymes  Recent Labs Lab 10/20/15 0957 10/21/15 0515  10/22/15 0503  10/22/15 2053 10/23/15 0449 10/24/15 0444   AST 124* 312*  --  428*  --   --   --   --   ALT 63* 176*  --  276*  --   --   --   --   ALKPHOS 567* 391*  --  209*  --   --   --   --   BILITOT 2.5* 3.1*  --  4.8*  --   --   --   --   ALBUMIN 3.1* 2.1*  < > 1.8*  1.8*  < > 1.8* 1.7* 1.7*  < > = values in this interval not displayed. Cardiac Enzymes  Recent Labs Lab 10/20/15 2242 10/21/15 1755 10/22/15 0106  TROPONINI 1.08* 0.44* 0.27*   Glucose  Recent Labs Lab 10/24/15 1928 10/24/15 2330 10/25/15 0350 10/25/15 0803 10/25/15 1142 10/25/15 1538  GLUCAP 183* 242* 199* 210* 199* 165*    Imaging No results found.  Cultures: BCx2 x2 1/24>>Proteus UC 1/24>>Proteus Sputum 1/26>> BC x 2 1/26>> UC 1/26>>  Antibiotics: Vanc 1/24>>1/25 Zosyn 1/24>>1/25 Ceftriaxone 1/25>>1/27 Meropenem   Lines: RIJ Vascath 1/24>> LIJ CVL 1/25>>  ASSESSMENT / PLAN: 70 yo Female admitted for acute resp failure from acute septic shock with  NSTEMI, with proteus bacteremia  PULMONARY OETT 7.57m Respiratory failure - hypoxic Septic shock -slowly  improving P:  - cont with MV - prn ABG - wean MV as tolerated - maintain sats >90% - WUA\SBT daily - wean fentanyl, started valium Could not attempt weaning trial due to continued reduced mental status.  CARDIOVASCULAR CVL - RIJ VASCATH, LIJ CVL Shock - septic hypotension NSTEMI Afib - started on amiodarone 1/24  P:  - maintain MAP >65 - vasopressors as needed - currently weaned off.  - monitor BP - trend CE - heparin gtt on hold due to low plt - mostly likely demand ischemia - EKG reviewed, no sig ST changes.  - cont with amiodarone - cardiology consult appreciated.   RENAL Anuria ARF UTI ?Rhabdomyolysis - CK improving, unlikely rhabdo Right hydronephrosis Proteus Bacteremia, Proteus UTI P:  -monitor Cr -HD today - follow up Ur Cx -current CK trending down, cont to follow, IVF -Etoh, salicylate levels normal  - U/S of Right Kidney with mild R hydro,  WBC improving, pressors are being wean down  -CT A/P was done prior to foley placement, bladder is now decompressed.  - meropenem in this severe septic patient.   GASTROINTESTINAL SUP - PPI Elevated LFTs - trend TF  HEMATOLOGIC Leukocytosis Thrombocytopenia - lower today P:  -most likely related to septic shock - source urine, now with Proteus Bacteremia -follow CBC -cont abx -stopped heparin 1/24 -CRRT on hold for 24 hrs again, reevaluate on 1/28 AM  INFECTIOUS Sepsis - Proteus UTI and Bacteremia Still spiking fevers  on antibiotics P:  - cont with current abx - Bld and UC Cx with Protues, narrowed abx to ceftriaxone on 1/25 - broaden abx to meropenem on 1/27  ENDOCRINE ICU hypo/hyperglycemia protocol  NEUROLOGIC A: Acute encephalopathy P:  RASS goal: -1 - metabolic encephalopathy - cont to monitor neuro status.   Marda Stalker M.D.  Critical Care Attestation.  I have personally obtained a history, examined the patient, evaluated laboratory and imaging results, formulated the assessment and plan and placed orders. The Patient requires high complexity decision making for assessment and support, frequent evaluation and titration of therapies, application of advanced monitoring technologies and extensive interpretation of multiple databases. The patient has critical illness that could lead imminently to failure of 1 or more organ systems and requires the highest level of physician preparedness to intervene.  Critical Care Time devoted to patient care services described in this note is 35 minutes and is exclusive of time spent in procedures.

## 2015-10-25 NOTE — Progress Notes (Signed)
Spoke to nephrologist, Dr. Juleen China, on the phone about potassium replacement. MD ordered oral potassium discontinued.

## 2015-10-25 NOTE — Progress Notes (Signed)
[facility] Progress Note Patient Name: April [name] DOB: 04/21/1946 MRN: [id number]   Date of Service  10/25/2015  HPI/Events of Note  Despite max precedex and fentanyl infusion, still intermittently agitated, fighting vent  eICU Interventions  Try add versed bolus prn, consider versed infusion and d/c precedex     Intervention Category Minor Interventions: Routine modifications to care plan (e.g. PRN medications for pain, fever)  Christinia Gully 10/25/2015, 10:35 PM

## 2015-10-25 NOTE — Progress Notes (Signed)
[place] NOTE  Pharmacy Consult for CRRT/HD medication management, Meropenem Dosing, Electrolyte Dosing, Constipation Prevention Indication: CRRT (now HD)/ Proteus Bacteremia     Allergies  Allergen Reactions  . Prednisone Anaphylaxis    Patient Measurements: Height: '5\' 7"'$  (170.2 cm) Weight: 288 lb 9.3 oz (130.9 kg) IBW/kg (Calculated) : 61.6   Vital Signs: Temp: 100.2 F (37.9 C) (01/29 0400) Temp Source: Core (Comment) (01/29 0000) BP: 86/48 mmHg (01/29 0400) Pulse Rate: 58 (01/29 0400) Intake/Output from previous day: 01/28 0701 - 01/29 0700 In: 1837.5 [I.V.:537.5; NG/GT:1200; IV Piggyback:100] Out: 1460 [Urine:1460] Intake/Output from this shift: Total I/O In: 694 [I.V.:244; NG/GT:450] Out: 680 [Urine:680] Vent settings for last 24 hours: Vent Mode:  [-] PRVC FiO2 (%):  [30 %] 30 % Set Rate:  [30 bmp] 30 bmp Vt Set:  [500 mL] 500 mL PEEP:  [5 cmH20] 5 cmH20  Labs:  Recent Labs  10/22/15 2053 10/23/15 0449 10/23/15 0911 10/24/15 0444 10/25/15 0414  WBC  --   --  23.4* 28.5* 25.4*  HGB  --   --  12.1 11.4* 10.8*  HCT  --   --  36.1 33.9* 31.8*  PLT  --   --  10* 16* 36*  CREATININE 2.06* 2.48*  --  2.95* 2.36*  MG  --  2.2  --  2.3  --   PHOS 2.8 2.8  --  3.2  --   ALBUMIN 1.8* 1.7*  --  1.7*  --    Estimated Creatinine Clearance: 31.7 mL/min (by C-G formula based on Cr of 2.36).   Recent Labs  10/24/15 1928 10/24/15 2330 10/25/15 0350  GLUCAP 183* 242* 199*    Microbiology: Recent Results (from the past 720 hour(s))  Urine culture     Status: None   Collection Time: 10/20/15  9:57 AM  Result Value Ref Range Status   Specimen Description URINE, RANDOM  Final   Special Requests NONE  Final   Culture >=100,000 COLONIES/mL PROTEUS MIRABILIS  Final   Report Status 10/22/2015 FINAL  Final   Organism ID, Bacteria PROTEUS MIRABILIS  Final      Susceptibility   Proteus mirabilis - MIC*    AMPICILLIN <=2 SENSITIVE  Sensitive     GENTAMICIN <=1 SENSITIVE Sensitive     CEFTRIAXONE Value in next row Sensitive      SENSITIVE<=1    CIPROFLOXACIN Value in next row Sensitive      SENSITIVE<=0.25    IMIPENEM Value in next row Sensitive      SENSITIVE2    NITROFURANTOIN Value in next row Resistant      RESISTANT128    TRIMETH/SULFA Value in next row Sensitive      SENSITIVE<=20    PIP/TAZO Value in next row Sensitive      SENSITIVE<=4    AMPICILLIN/SULBACTAM Value in next row Sensitive      SENSITIVE<=2    * >=100,000 COLONIES/mL PROTEUS MIRABILIS  Culture, blood (routine x 2)     Status: None   Collection Time: 10/20/15  9:57 AM  Result Value Ref Range Status   Specimen Description BLOOD RIGHT WRIST  Final   Special Requests   Final    BOTTLES DRAWN AEROBIC AND ANAEROBIC ANA 1ML AER 4ML   Culture  Setup Time   Final    GRAM NEGATIVE RODS IN BOTH AEROBIC AND ANAEROBIC BOTTLES CRITICAL RESULT CALLED TO, READ BACK BY AND VERIFIED WITH: MATT Regan Mcbryar ON 10/21/15 AT 0005 [facility] CONFIRMED BY  [facility]    Culture   Final    PROTEUS MIRABILIS IN BOTH AEROBIC AND ANAEROBIC BOTTLES    Report Status 10/22/2015 FINAL  Final   Organism ID, Bacteria PROTEUS MIRABILIS  Final      Susceptibility   Proteus mirabilis - MIC*    AMPICILLIN/SULBACTAM Value in next row Sensitive      SENSITIVE<=2    PIP/TAZO Value in next row Sensitive      SENSITIVE<=4    CEFTRIAXONE Value in next row Sensitive      SENSITIVE<=1    IMIPENEM Value in next row Sensitive      SENSITIVE2    GENTAMICIN Value in next row Sensitive      SENSITIVE<=1    CIPROFLOXACIN Value in next row Sensitive      SENSITIVE<=0.25    * PROTEUS MIRABILIS  Culture, blood (routine x 2)     Status: None   Collection Time: 10/20/15  9:57 AM  Result Value Ref Range Status   Specimen Description BLOOD LEFT HAND  Final   Special Requests   Final    BOTTLES DRAWN AEROBIC AND ANAEROBIC AER 3ML ANA 2ML   Culture  Setup Time   Final    GRAM NEGATIVE RODS IN BOTH  AEROBIC AND ANAEROBIC BOTTLES CRITICAL VALUE NOTED.  VALUE IS CONSISTENT WITH PREVIOUSLY REPORTED AND CALLED VALUE.    Culture   Final    PROTEUS MIRABILIS IN BOTH AEROBIC AND ANAEROBIC BOTTLES    Report Status 10/22/2015 FINAL  Final   Organism ID, Bacteria PROTEUS MIRABILIS  Final      Susceptibility   Proteus mirabilis - MIC*    AMPICILLIN Value in next row Sensitive      SENSITIVE<=2    PIP/TAZO Value in next row Sensitive      SENSITIVE<=4    CEFTAZIDIME Value in next row Sensitive      SENSITIVE<=1    CEFTRIAXONE Value in next row Sensitive      SENSITIVE<=1    CEFEPIME Value in next row Sensitive      SENSITIVE<=1    IMIPENEM Value in next row Sensitive      SENSITIVE2    GENTAMICIN Value in next row Sensitive      SENSITIVE<=1    CIPROFLOXACIN Value in next row Sensitive      SENSITIVE<=0.25    * PROTEUS MIRABILIS  Blood Culture ID Panel (Reflexed)     Status: Abnormal   Collection Time: 10/20/15  9:57 AM  Result Value Ref Range Status   Enterococcus species NOT DETECTED NOT DETECTED Final   Listeria monocytogenes NOT DETECTED NOT DETECTED Final   Staphylococcus species NOT DETECTED NOT DETECTED Final   Staphylococcus aureus NOT DETECTED NOT DETECTED Final   Streptococcus species NOT DETECTED NOT DETECTED Final   Streptococcus agalactiae NOT DETECTED NOT DETECTED Final   Streptococcus pneumoniae NOT DETECTED NOT DETECTED Final   Streptococcus pyogenes NOT DETECTED NOT DETECTED Final   Acinetobacter baumannii NOT DETECTED NOT DETECTED Final   Enterobacteriaceae species DETECTED (A) NOT DETECTED Final    Comment: CRITICAL RESULT CALLED TO, READ BACK BY AND VERIFIED WITH: MATT Seda Kronberg ON 10/21/15 AT 0005 [place]    Enterobacter cloacae complex NOT DETECTED NOT DETECTED Final   Escherichia coli NOT DETECTED NOT DETECTED Final   Klebsiella oxytoca NOT DETECTED NOT DETECTED Final   Klebsiella pneumoniae NOT DETECTED NOT DETECTED Final   Proteus species (A) NOT DETECTED  Final    CRITICAL RESULT CALLED TO, READ  BACK BY AND VERIFIED WITH:    Comment: MATT Mackensi Mahadeo ON 10/21/15 AT 0005 [place]   Serratia marcescens NOT DETECTED NOT DETECTED Final   Haemophilus influenzae NOT DETECTED NOT DETECTED Final   Neisseria meningitidis NOT DETECTED NOT DETECTED Final   Pseudomonas aeruginosa NOT DETECTED NOT DETECTED Final   Candida albicans NOT DETECTED NOT DETECTED Final   Candida glabrata NOT DETECTED NOT DETECTED Final   Candida krusei NOT DETECTED NOT DETECTED Final   Candida parapsilosis NOT DETECTED NOT DETECTED Final   Candida tropicalis NOT DETECTED NOT DETECTED Final   Carbapenem resistance NOT DETECTED NOT DETECTED Final   Methicillin resistance NOT DETECTED NOT DETECTED Final   Vancomycin resistance NOT DETECTED NOT DETECTED Final  MRSA PCR Screening     Status: None   Collection Time: 10/20/15  7:41 PM  Result Value Ref Range Status   MRSA by PCR NEGATIVE NEGATIVE Final    Comment:        The GeneXpert MRSA Assay (FDA approved for NASAL specimens only), is one component of a comprehensive MRSA colonization surveillance program. It is not intended to diagnose MRSA infection nor to guide or monitor treatment for MRSA infections.   CULTURE, BLOOD (ROUTINE X 2) w Reflex to PCR ID Panel     Status: None (Preliminary result)   Collection Time: 10/22/15 11:39 AM  Result Value Ref Range Status   Specimen Description BLOOD LEFT HAND  Final   Special Requests BOTTLES DRAWN AEROBIC AND ANAEROBIC 1CC  Final   Culture NO GROWTH 2 DAYS  Final   Report Status PENDING  Incomplete  Culture, expectorated sputum-assessment     Status: None   Collection Time: 10/22/15 12:00 PM  Result Value Ref Range Status   Specimen Description SPUTUM  Final   Special Requests Normal  Final   Sputum evaluation THIS SPECIMEN IS ACCEPTABLE FOR SPUTUM CULTURE  Final   Report Status 10/22/2015 FINAL  Final  Culture, respiratory (NON-Expectorated)     Status: None   Collection Time:  10/22/15 12:00 PM  Result Value Ref Range Status   Specimen Description SPUTUM  Final   Special Requests Normal Reflexed from [id number]  Final   Gram Stain   Final    FAIR SPECIMEN - 70-80% WBCS FEW WBC SEEN RARE YEAST RARE GRAM NEGATIVE RODS    Culture LIGHT GROWTH ENTEROBACTER AEROGENES  Final   Report Status 10/24/2015 FINAL  Final   Organism ID, Bacteria ENTEROBACTER AEROGENES  Final      Susceptibility   Enterobacter aerogenes - MIC*    CEFAZOLIN >=64 RESISTANT Resistant     CEFEPIME <=1 SENSITIVE Sensitive     CEFTAZIDIME <=1 SENSITIVE Sensitive     CEFTRIAXONE <=1 SENSITIVE Sensitive     CIPROFLOXACIN <=0.25 SENSITIVE Sensitive     GENTAMICIN <=1 SENSITIVE Sensitive     IMIPENEM 2 SENSITIVE Sensitive     TRIMETH/SULFA <=20 SENSITIVE Sensitive     PIP/TAZO <=4 SENSITIVE Sensitive     * LIGHT GROWTH ENTEROBACTER AEROGENES  Urine culture     Status: None   Collection Time: 10/22/15 12:45 PM  Result Value Ref Range Status   Specimen Description URINE, RANDOM  Final   Special Requests NONE  Final   Culture NO GROWTH 2 DAYS  Final   Report Status 10/24/2015 FINAL  Final    Medications:  Scheduled:  . amiodarone  400 mg Oral BID  . antiseptic oral rinse  7 mL Mouth Rinse QID  . chlorhexidine gluconate  15 mL Mouth Rinse BID  . feeding supplement (PRO-STAT SUGAR FREE 64)  30 mL Per Tube TID  . free water  100 mL Per Tube 3 times per day  . insulin aspart  0-15 Units Subcutaneous 6 times per day  . insulin aspart  5 Units Subcutaneous Q4H  . meropenem (MERREM) IV  500 mg Intravenous Q24H  . pantoprazole sodium  40 mg Per Tube Q24H  . potassium chloride  20 mEq Oral TID  . senna-docusate  2 tablet Oral BID   Infusions:  . dexmedetomidine 0.1 mcg/kg/hr (10/25/15 0412)  . feeding supplement (VITAL AF 1.2 CAL) 1,000 mL (10/24/15 1900)  . fentaNYL infusion INTRAVENOUS 350 mcg/hr (10/24/15 2358)  . norepinephrine (LEVOPHED) Adult infusion Stopped (10/23/15 0920)  . propofol  (DIPRIVAN) infusion    . pureflow 2,000 mL/hr at 10/22/15 0104  . vasopressin (PITRESSIN) infusion - *FOR SHOCK* Stopped (10/23/15 7494)    Assessment: Pharmacy consulted to adjust medications for 70 yo female ICU patient requiring mechanical ventilation and CRRT. Patient previously ordered heparin drip for NSTEMI, discontinued to due to thrombocytopenia.     Plan:  1. CRRT on hold due to thrombocytopenia. No further renal adjustments warranted at this time. MD Kolluru to order intermittent HD trial today.  2. 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 Now with HD, will adjust meropenem to '500mg'$  IV Q24hr after HD on HD days. Will follow cultures and adjust as appropriate.    3. Electrolytes are within normal limits. Will recheck electrolytes with am labs.   4. Will continue senna/docusate 2 tab VT BID and add bisacodyl suppository x1, as patient hasn't had a BM since admission.   Pharmacy will continue to monitor and adjust per consult.     1/29 AM K+ 2.9. 20 mEq per tube x 3 doses ordered. Recheck BMP and Mg in AM.  Roe Coombs, PharmD Pharmacy Resident 10/25/2015

## 2015-10-25 NOTE — Progress Notes (Signed)
CRITICAL VALUE ALERT  Critical value received:  K 2.9   Date of notification:  10/25/15  Time of notification:  0450  Critical value read back:Yes.    Nurse who received alert:  Adin Hector  MD notified (1st page):  Dr. Marcille Blanco  Time of first page:  [phone]  Pharmacy notified   Time of second page: 0508  Responding MD:  Dr. Marcille Blanco  Time MD responded:  [phone]

## 2015-10-25 NOTE — Progress Notes (Signed)
[facility]  ROUNDING NOTE   Subjective:   Intermittent hemodialysis treatment yesterday. Tolerated treatment well. UF of 0.  UOP 1580 Tmax 100.2 Wbc 25.4 (28.5)  K 2.9   Objective:  Vital signs in last 24 hours:  Temp:  [98.1 F (36.7 C)-100.2 F (37.9 C)] 99.5 F (37.5 C) (01/29 0700) Pulse Rate:  [57-87] 81 (01/29 0700) Resp:  [14-30] 30 (01/29 0700) BP: (85-131)/(46-96) 123/62 mmHg (01/29 0700) SpO2:  [86 %-98 %] 92 % (01/29 0700) FiO2 (%):  [30 %] 30 % (01/29 0815) Weight:  [130.9 kg (288 lb 9.3 oz)] 130.9 kg (288 lb 9.3 oz) (01/29 0441)  Weight change: 2.5 kg (5 lb 8.2 oz) Filed Weights   10/23/15 0500 10/24/15 0408 10/25/15 0441  Weight: 129.6 kg (285 lb 11.5 oz) 128.4 kg (283 lb 1.1 oz) 130.9 kg (288 lb 9.3 oz)    Intake/Output: I/O last 3 completed shifts: In: 3347.2 [I.V.:1067.2; NG/GT:2080; IV Piggyback:200] Out: 1965 [Urine:1965]   Intake/Output this shift:  Total I/O In: 92.5 [I.V.:42.5; NG/GT:50] Out: 200 [Urine:200]  Physical Exam: General: Critically ill, intubated, sedated  Head: +ETT, +OGT  Eyes: Eyes closed, reactive to light, +icterus  Neck: RIJ temp HD catheter, L IJ central line 1/25 Dr. Lucky Cowboy  Lungs:  PRVC FiO2 30%  Heart: Irregular rhythm, bradycardia  Abdomen:  Soft, nontender, obese  Extremities: 3+ peripheral edema. +anasarca  Neurologic: Sedated, intubated  Skin: mottling  Access: RIJ vascath 10/20/15 Dr. Stevenson Clinch    Basic Metabolic Panel:  Recent Labs Lab 10/21/15 0515 10/21/15 1755 10/22/15 0503 10/22/15 1323 10/22/15 2053 10/23/15 0449 10/24/15 0444 10/25/15 0414  NA 135 136 137  137 137 139 138 138 142  K 3.9 4.2 4.0  4.0 3.9 3.7 3.6 3.7 2.9*  CL 103 101 101  102 102 100* 100* 103 104  CO2 18* '23 26  24 27 26 25 26 28  '$ GLUCOSE 263* 317* 364*  356* 314* 199* 246* 238* 231*  BUN 45* 34* 35*  34* 41* 49* 60* 91* 82*  CREATININE 1.87* 1.59* 1.34*  1.34* 1.72* 2.06* 2.48* 2.95* 2.36*  CALCIUM 7.3*  7.6* 7.7*  7.6* 7.9* 7.8* 7.7* 7.5* 7.5*  MG 1.5* 1.6* 1.6*  --   --  2.2 2.3  --   PHOS 3.1 2.3* 1.9* 1.7* 2.8 2.8 3.2  --     Liver Function Tests:  Recent Labs Lab 10/20/15 0957 10/21/15 0515  10/22/15 0503 10/22/15 1323 10/22/15 2053 10/23/15 0449 10/24/15 0444  AST 124* 312*  --  428*  --   --   --   --   ALT 63* 176*  --  276*  --   --   --   --   ALKPHOS 567* 391*  --  209*  --   --   --   --   BILITOT 2.5* 3.1*  --  4.8*  --   --   --   --   PROT 7.4 5.3*  --  4.9*  --   --   --   --   ALBUMIN 3.1* 2.1*  < > 1.8*  1.8* 1.8* 1.8* 1.7* 1.7*  < > = values in this interval not displayed.  Recent Labs Lab 10/20/15 0957  LIPASE 11    Recent Labs Lab 10/20/15 0957  AMMONIA 34    CBC:  Recent Labs Lab 10/20/15 0957  10/22/15 0503 10/22/15 1323 10/23/15 0911 10/24/15 0444 10/25/15 0414  WBC 50.5*  < >  19.7* 18.8* 23.4* 28.5* 25.4*  NEUTROABS 47.0*  --   --   --   --   --   --   HGB 16.4*  < > 12.1 12.3 12.1 11.4* 10.8*  HCT 49.9*  < > 37.0 36.8 36.1 33.9* 31.8*  MCV 90.2  < > 89.9 89.3 88.2 88.5 87.5  PLT 62*  < > 12* 14* 10* 16* 36*  < > = values in this interval not displayed.  Cardiac Enzymes:  Recent Labs Lab 10/20/15 0957 10/20/15 1010 10/20/15 2242 10/21/15 1755 10/22/15 0106 10/22/15 0503  CKTOTAL  --  1562* 779*  --   --  162  TROPONINI 1.21*  --  1.08* 0.44* 0.27*  --     BNP: Invalid input(s): POCBNP  CBG:  Recent Labs Lab 10/24/15 1622 10/24/15 1928 10/24/15 2330 10/25/15 0350 10/25/15 0803  GLUCAP 225* 183* 242* 199* 210*    Microbiology: Results for orders placed or performed during the hospital encounter of 10/20/15  Urine culture     Status: None   Collection Time: 10/20/15  9:57 AM  Result Value Ref Range Status   Specimen Description URINE, RANDOM  Final   Special Requests NONE  Final   Culture >=100,000 COLONIES/mL PROTEUS MIRABILIS  Final   Report Status 10/22/2015 FINAL  Final   Organism ID, Bacteria  PROTEUS MIRABILIS  Final      Susceptibility   Proteus mirabilis - MIC*    AMPICILLIN <=2 SENSITIVE Sensitive     GENTAMICIN <=1 SENSITIVE Sensitive     CEFTRIAXONE Value in next row Sensitive      SENSITIVE<=1    CIPROFLOXACIN Value in next row Sensitive      SENSITIVE<=0.25    IMIPENEM Value in next row Sensitive      SENSITIVE2    NITROFURANTOIN Value in next row Resistant      RESISTANT128    TRIMETH/SULFA Value in next row Sensitive      SENSITIVE<=20    PIP/TAZO Value in next row Sensitive      SENSITIVE<=4    AMPICILLIN/SULBACTAM Value in next row Sensitive      SENSITIVE<=2    * >=100,000 COLONIES/mL PROTEUS MIRABILIS  Culture, blood (routine x 2)     Status: None   Collection Time: 10/20/15  9:57 AM  Result Value Ref Range Status   Specimen Description BLOOD RIGHT WRIST  Final   Special Requests   Final    BOTTLES DRAWN AEROBIC AND ANAEROBIC ANA 1ML AER 4ML   Culture  Setup Time   Final    GRAM NEGATIVE RODS IN BOTH AEROBIC AND ANAEROBIC BOTTLES CRITICAL RESULT CALLED TO, READ BACK BY AND VERIFIED WITH: MATT MCBANE ON 10/21/15 AT 0005 [facility] CONFIRMED BY [place]    Culture   Final    PROTEUS MIRABILIS IN BOTH AEROBIC AND ANAEROBIC BOTTLES    Report Status 10/22/2015 FINAL  Final   Organism ID, Bacteria PROTEUS MIRABILIS  Final      Susceptibility   Proteus mirabilis - MIC*    AMPICILLIN/SULBACTAM Value in next row Sensitive      SENSITIVE<=2    PIP/TAZO Value in next row Sensitive      SENSITIVE<=4    CEFTRIAXONE Value in next row Sensitive      SENSITIVE<=1    IMIPENEM Value in next row Sensitive      SENSITIVE2    GENTAMICIN Value in next row Sensitive      SENSITIVE<=1    CIPROFLOXACIN Value  in next row Sensitive      SENSITIVE<=0.25    * PROTEUS MIRABILIS  Culture, blood (routine x 2)     Status: None   Collection Time: 10/20/15  9:57 AM  Result Value Ref Range Status   Specimen Description BLOOD LEFT HAND  Final   Special Requests   Final    BOTTLES  DRAWN AEROBIC AND ANAEROBIC AER 3ML ANA 2ML   Culture  Setup Time   Final    GRAM NEGATIVE RODS IN BOTH AEROBIC AND ANAEROBIC BOTTLES CRITICAL VALUE NOTED.  VALUE IS CONSISTENT WITH PREVIOUSLY REPORTED AND CALLED VALUE.    Culture   Final    PROTEUS MIRABILIS IN BOTH AEROBIC AND ANAEROBIC BOTTLES    Report Status 10/22/2015 FINAL  Final   Organism ID, Bacteria PROTEUS MIRABILIS  Final      Susceptibility   Proteus mirabilis - MIC*    AMPICILLIN Value in next row Sensitive      SENSITIVE<=2    PIP/TAZO Value in next row Sensitive      SENSITIVE<=4    CEFTAZIDIME Value in next row Sensitive      SENSITIVE<=1    CEFTRIAXONE Value in next row Sensitive      SENSITIVE<=1    CEFEPIME Value in next row Sensitive      SENSITIVE<=1    IMIPENEM Value in next row Sensitive      SENSITIVE2    GENTAMICIN Value in next row Sensitive      SENSITIVE<=1    CIPROFLOXACIN Value in next row Sensitive      SENSITIVE<=0.25    * PROTEUS MIRABILIS  Blood Culture ID Panel (Reflexed)     Status: Abnormal   Collection Time: 10/20/15  9:57 AM  Result Value Ref Range Status   Enterococcus species NOT DETECTED NOT DETECTED Final   Listeria monocytogenes NOT DETECTED NOT DETECTED Final   Staphylococcus species NOT DETECTED NOT DETECTED Final   Staphylococcus aureus NOT DETECTED NOT DETECTED Final   Streptococcus species NOT DETECTED NOT DETECTED Final   Streptococcus agalactiae NOT DETECTED NOT DETECTED Final   Streptococcus pneumoniae NOT DETECTED NOT DETECTED Final   Streptococcus pyogenes NOT DETECTED NOT DETECTED Final   Acinetobacter baumannii NOT DETECTED NOT DETECTED Final   Enterobacteriaceae species DETECTED (A) NOT DETECTED Final    Comment: CRITICAL RESULT CALLED TO, READ BACK BY AND VERIFIED WITH: MATT MCBANE ON 10/21/15 AT 0005 [place]    Enterobacter cloacae complex NOT DETECTED NOT DETECTED Final   Escherichia coli NOT DETECTED NOT DETECTED Final   Klebsiella oxytoca NOT DETECTED NOT  DETECTED Final   Klebsiella pneumoniae NOT DETECTED NOT DETECTED Final   Proteus species (A) NOT DETECTED Final    CRITICAL RESULT CALLED TO, READ BACK BY AND VERIFIED WITH:    Comment: MATT MCBANE ON 10/21/15 AT 0005 [place]   Serratia marcescens NOT DETECTED NOT DETECTED Final   Haemophilus influenzae NOT DETECTED NOT DETECTED Final   Neisseria meningitidis NOT DETECTED NOT DETECTED Final   Pseudomonas aeruginosa NOT DETECTED NOT DETECTED Final   Candida albicans NOT DETECTED NOT DETECTED Final   Candida glabrata NOT DETECTED NOT DETECTED Final   Candida krusei NOT DETECTED NOT DETECTED Final   Candida parapsilosis NOT DETECTED NOT DETECTED Final   Candida tropicalis NOT DETECTED NOT DETECTED Final   Carbapenem resistance NOT DETECTED NOT DETECTED Final   Methicillin resistance NOT DETECTED NOT DETECTED Final   Vancomycin resistance NOT DETECTED NOT DETECTED Final  MRSA PCR Screening  Status: None   Collection Time: 10/20/15  7:41 PM  Result Value Ref Range Status   MRSA by PCR NEGATIVE NEGATIVE Final    Comment:        The GeneXpert MRSA Assay (FDA approved for NASAL specimens only), is one component of a comprehensive MRSA colonization surveillance program. It is not intended to diagnose MRSA infection nor to guide or monitor treatment for MRSA infections.   CULTURE, BLOOD (ROUTINE X 2) w Reflex to PCR ID Panel     Status: None (Preliminary result)   Collection Time: 10/22/15 11:39 AM  Result Value Ref Range Status   Specimen Description BLOOD LEFT HAND  Final   Special Requests BOTTLES DRAWN AEROBIC AND ANAEROBIC 1CC  Final   Culture NO GROWTH 3 DAYS  Final   Report Status PENDING  Incomplete  Culture, expectorated sputum-assessment     Status: None   Collection Time: 10/22/15 12:00 PM  Result Value Ref Range Status   Specimen Description SPUTUM  Final   Special Requests Normal  Final   Sputum evaluation THIS SPECIMEN IS ACCEPTABLE FOR SPUTUM CULTURE  Final   Report  Status 10/22/2015 FINAL  Final  Culture, respiratory (NON-Expectorated)     Status: None   Collection Time: 10/22/15 12:00 PM  Result Value Ref Range Status   Specimen Description SPUTUM  Final   Special Requests Normal Reflexed from [id number]  Final   Gram Stain   Final    FAIR SPECIMEN - 70-80% WBCS FEW WBC SEEN RARE YEAST RARE GRAM NEGATIVE RODS    Culture LIGHT GROWTH ENTEROBACTER AEROGENES  Final   Report Status 10/24/2015 FINAL  Final   Organism ID, Bacteria ENTEROBACTER AEROGENES  Final      Susceptibility   Enterobacter aerogenes - MIC*    CEFAZOLIN >=64 RESISTANT Resistant     CEFEPIME <=1 SENSITIVE Sensitive     CEFTAZIDIME <=1 SENSITIVE Sensitive     CEFTRIAXONE <=1 SENSITIVE Sensitive     CIPROFLOXACIN <=0.25 SENSITIVE Sensitive     GENTAMICIN <=1 SENSITIVE Sensitive     IMIPENEM 2 SENSITIVE Sensitive     TRIMETH/SULFA <=20 SENSITIVE Sensitive     PIP/TAZO <=4 SENSITIVE Sensitive     * LIGHT GROWTH ENTEROBACTER AEROGENES  Urine culture     Status: None   Collection Time: 10/22/15 12:45 PM  Result Value Ref Range Status   Specimen Description URINE, RANDOM  Final   Special Requests NONE  Final   Culture NO GROWTH 2 DAYS  Final   Report Status 10/24/2015 FINAL  Final    Coagulation Studies: No results for input(s): LABPROT, INR in the last 72 hours.  Urinalysis: No results for input(s): COLORURINE, LABSPEC, PHURINE, GLUCOSEU, HGBUR, BILIRUBINUR, KETONESUR, PROTEINUR, UROBILINOGEN, NITRITE, LEUKOCYTESUR in the last 72 hours.  Invalid input(s): APPERANCEUR    Imaging: Dg Chest Port 1 View  10/23/2015  CLINICAL DATA:  Respiratory difficulty EXAM: PORTABLE CHEST 1 VIEW COMPARISON:  10/21/2015 FINDINGS: Endotracheal and NG tubes are stable. Left jugular venous catheter with its tip in the upper SVC is stable. Right jugular dialysis catheter with its tip in the upper SVC is stable. Lungs remain under aerated with central and basilar atelectasis. Mild vascular  congestion is stable. No pneumothorax. IMPRESSION: Stable tubular devices Stable vascular congestion and bibasilar atelectasis. Electronically Signed   By: Marybelle Killings M.D.   On: 10/23/2015 12:42     Medications:   . dexmedetomidine 0.3 mcg/kg/hr (10/25/15 0837)  . feeding supplement (VITAL AF 1.2  CAL) 1,000 mL (10/24/15 1900)  . fentaNYL infusion INTRAVENOUS 400 mcg/hr (10/25/15 0810)  . norepinephrine (LEVOPHED) Adult infusion Stopped (10/23/15 0920)  . propofol (DIPRIVAN) infusion    . pureflow 2,000 mL/hr at 10/22/15 0104  . vasopressin (PITRESSIN) infusion - *FOR SHOCK* Stopped (10/23/15 0950)   . amiodarone  400 mg Oral BID  . antiseptic oral rinse  7 mL Mouth Rinse QID  . chlorhexidine gluconate  15 mL Mouth Rinse BID  . feeding supplement (PRO-STAT SUGAR FREE 64)  30 mL Per Tube TID  . free water  100 mL Per Tube 3 times per day  . insulin aspart  0-15 Units Subcutaneous 6 times per day  . insulin aspart  5 Units Subcutaneous Q4H  . meropenem (MERREM) IV  500 mg Intravenous Q24H  . pantoprazole sodium  40 mg Per Tube Q24H  . potassium chloride  10 mEq Intravenous Q1 Hr x 3  . senna-docusate  2 tablet Oral BID   acetaminophen **OR** acetaminophen  Assessment/ Plan:  [name] is a 70 y.o. white female with unknown past medical history, who was admitted to [facility] on 10/20/2015  1. Acute renal failure with metabolic acidosis: oliguric with hematuria, proteinuria and pyuria. Initially hemodynamically unstable on vasopressors. Unknown baseline creatinine. Required CRRT from 1/24 to 1/27. Then intermittent hemodialysis 1/28.  Acute renal failure from sepsis leading to ATN. Serologic testing negative 10/21/15 Now nonoliguric with good urine output.  - Hold dialysis for today and monitor volume status, urine output and renal function.  - monitor volume status, urine output, electrolytes and renal function  2. Hypokalemia: post ATN diuresis.  - potassium being replaced.    2. Sepsis/hypotension secondary to urinary tract infection: monitor wbc and temp.   Blood cultures and urine cultures with proteus on ceftriaxone.  - Norepinephrine and vasopressin now weaned.   3. Atrial fibrillation: new onset. Today with bradycardia. Rapid ventricular response on admission.  - amiodarone - echocardiogram reviewed.   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 Platelets now trending back up.  - argatroban   LOS: [place], [place] 1/29/20179:04 AM

## 2015-10-25 NOTE — Progress Notes (Signed)
eLink Physician-Brief Progress Note Patient Name: EVERLEAN BUCHER DOB: 1945/12/09 MRN: [id number]   Date of Service  10/25/2015  HPI/Events of Note  Air trapping mod severe on RR 30    eICU Interventions     try decrease rate to 25 and increase Vt to 550 as this is not really an ards case at this point     Intervention Category Major Interventions: Respiratory failure - evaluation and management  Christinia Gully 10/25/2015, 9:49 PM

## 2015-10-25 NOTE — Progress Notes (Signed)
Pt has been progressively agitated throughout the night. Precidex was still available on the med list, so this RN started it at 0.1. Fent remains at 385mg. Pt has been grimacing and seems very uncomfortable; however, cannot communicate that. I will continue to monitor RASS score and pts vitals.

## 2015-10-25 NOTE — Progress Notes (Signed)
[facility] NOTE  Pharmacy Consult for CRRT/HD medication management, Meropenem Dosing, Electrolyte Dosing, Constipation Prevention Indication: CRRT (now HD)/ Proteus Bacteremia     Allergies  Allergen Reactions  . Prednisone Anaphylaxis    Patient Measurements: Height: '5\' 7"'$  (170.2 cm) Weight: 288 lb 9.3 oz (130.9 kg) IBW/kg (Calculated) : 61.6   Vital Signs: Temp: 99.5 F (37.5 C) (01/29 0700) Temp Source: Core (Comment) (01/29 0400) BP: 123/62 mmHg (01/29 0700) Pulse Rate: 81 (01/29 0700) Intake/Output from previous day: 01/28 0701 - 01/29 0700 In: 2385.8 [I.V.:735.8; NG/GT:1550; IV Piggyback:100] Out: 1580 [Urine:1580] Intake/Output from this shift:   Vent settings for last 24 hours: Vent Mode:  [-] PRVC FiO2 (%):  [30 %] 30 % Set Rate:  [30 bmp] 30 bmp Vt Set:  [500 mL] 500 mL PEEP:  [5 cmH20] 5 cmH20  Labs:  Recent Labs  10/22/15 2053 10/23/15 0449 10/23/15 0911 10/24/15 0444 10/25/15 0414  WBC  --   --  23.4* 28.5* 25.4*  HGB  --   --  12.1 11.4* 10.8*  HCT  --   --  36.1 33.9* 31.8*  PLT  --   --  10* 16* 36*  CREATININE 2.06* 2.48*  --  2.95* 2.36*  MG  --  2.2  --  2.3  --   PHOS 2.8 2.8  --  3.2  --   ALBUMIN 1.8* 1.7*  --  1.7*  --    Estimated Creatinine Clearance: 31.7 mL/min (by C-G formula based on Cr of 2.36).   Recent Labs  10/24/15 1928 10/24/15 2330 10/25/15 0350  GLUCAP 183* 242* 199*    Microbiology: Recent Results (from the past 720 hour(s))  Urine culture     Status: None   Collection Time: 10/20/15  9:57 AM  Result Value Ref Range Status   Specimen Description URINE, RANDOM  Final   Special Requests NONE  Final   Culture >=100,000 COLONIES/mL PROTEUS MIRABILIS  Final   Report Status 10/22/2015 FINAL  Final   Organism ID, Bacteria PROTEUS MIRABILIS  Final      Susceptibility   Proteus mirabilis - MIC*    AMPICILLIN <=2 SENSITIVE Sensitive     GENTAMICIN <=1 SENSITIVE Sensitive     CEFTRIAXONE  Value in next row Sensitive      SENSITIVE<=1    CIPROFLOXACIN Value in next row Sensitive      SENSITIVE<=0.25    IMIPENEM Value in next row Sensitive      SENSITIVE2    NITROFURANTOIN Value in next row Resistant      RESISTANT128    TRIMETH/SULFA Value in next row Sensitive      SENSITIVE<=20    PIP/TAZO Value in next row Sensitive      SENSITIVE<=4    AMPICILLIN/SULBACTAM Value in next row Sensitive      SENSITIVE<=2    * >=100,000 COLONIES/mL PROTEUS MIRABILIS  Culture, blood (routine x 2)     Status: None   Collection Time: 10/20/15  9:57 AM  Result Value Ref Range Status   Specimen Description BLOOD RIGHT WRIST  Final   Special Requests   Final    BOTTLES DRAWN AEROBIC AND ANAEROBIC ANA 1ML AER 4ML   Culture  Setup Time   Final    GRAM NEGATIVE RODS IN BOTH AEROBIC AND ANAEROBIC BOTTLES CRITICAL RESULT CALLED TO, READ BACK BY AND VERIFIED WITH: MATT MCBANE ON 10/21/15 AT 0005 [facility] CONFIRMED BY [place]    Culture  Final    PROTEUS MIRABILIS IN BOTH AEROBIC AND ANAEROBIC BOTTLES    Report Status 10/22/2015 FINAL  Final   Organism ID, Bacteria PROTEUS MIRABILIS  Final      Susceptibility   Proteus mirabilis - MIC*    AMPICILLIN/SULBACTAM Value in next row Sensitive      SENSITIVE<=2    PIP/TAZO Value in next row Sensitive      SENSITIVE<=4    CEFTRIAXONE Value in next row Sensitive      SENSITIVE<=1    IMIPENEM Value in next row Sensitive      SENSITIVE2    GENTAMICIN Value in next row Sensitive      SENSITIVE<=1    CIPROFLOXACIN Value in next row Sensitive      SENSITIVE<=0.25    * PROTEUS MIRABILIS  Culture, blood (routine x 2)     Status: None   Collection Time: 10/20/15  9:57 AM  Result Value Ref Range Status   Specimen Description BLOOD LEFT HAND  Final   Special Requests   Final    BOTTLES DRAWN AEROBIC AND ANAEROBIC AER 3ML ANA 2ML   Culture  Setup Time   Final    GRAM NEGATIVE RODS IN BOTH AEROBIC AND ANAEROBIC BOTTLES CRITICAL VALUE NOTED.  VALUE IS  CONSISTENT WITH PREVIOUSLY REPORTED AND CALLED VALUE.    Culture   Final    PROTEUS MIRABILIS IN BOTH AEROBIC AND ANAEROBIC BOTTLES    Report Status 10/22/2015 FINAL  Final   Organism ID, Bacteria PROTEUS MIRABILIS  Final      Susceptibility   Proteus mirabilis - MIC*    AMPICILLIN Value in next row Sensitive      SENSITIVE<=2    PIP/TAZO Value in next row Sensitive      SENSITIVE<=4    CEFTAZIDIME Value in next row Sensitive      SENSITIVE<=1    CEFTRIAXONE Value in next row Sensitive      SENSITIVE<=1    CEFEPIME Value in next row Sensitive      SENSITIVE<=1    IMIPENEM Value in next row Sensitive      SENSITIVE2    GENTAMICIN Value in next row Sensitive      SENSITIVE<=1    CIPROFLOXACIN Value in next row Sensitive      SENSITIVE<=0.25    * PROTEUS MIRABILIS  Blood Culture ID Panel (Reflexed)     Status: Abnormal   Collection Time: 10/20/15  9:57 AM  Result Value Ref Range Status   Enterococcus species NOT DETECTED NOT DETECTED Final   Listeria monocytogenes NOT DETECTED NOT DETECTED Final   Staphylococcus species NOT DETECTED NOT DETECTED Final   Staphylococcus aureus NOT DETECTED NOT DETECTED Final   Streptococcus species NOT DETECTED NOT DETECTED Final   Streptococcus agalactiae NOT DETECTED NOT DETECTED Final   Streptococcus pneumoniae NOT DETECTED NOT DETECTED Final   Streptococcus pyogenes NOT DETECTED NOT DETECTED Final   Acinetobacter baumannii NOT DETECTED NOT DETECTED Final   Enterobacteriaceae species DETECTED (A) NOT DETECTED Final    Comment: CRITICAL RESULT CALLED TO, READ BACK BY AND VERIFIED WITH: MATT MCBANE ON 10/21/15 AT 0005 [place]    Enterobacter cloacae complex NOT DETECTED NOT DETECTED Final   Escherichia coli NOT DETECTED NOT DETECTED Final   Klebsiella oxytoca NOT DETECTED NOT DETECTED Final   Klebsiella pneumoniae NOT DETECTED NOT DETECTED Final   Proteus species (A) NOT DETECTED Final    CRITICAL RESULT CALLED TO, READ BACK BY AND VERIFIED  WITH:  Comment: MATT MCBANE ON 10/21/15 AT 0005 [place]   Serratia marcescens NOT DETECTED NOT DETECTED Final   Haemophilus influenzae NOT DETECTED NOT DETECTED Final   Neisseria meningitidis NOT DETECTED NOT DETECTED Final   Pseudomonas aeruginosa NOT DETECTED NOT DETECTED Final   Candida albicans NOT DETECTED NOT DETECTED Final   Candida glabrata NOT DETECTED NOT DETECTED Final   Candida krusei NOT DETECTED NOT DETECTED Final   Candida parapsilosis NOT DETECTED NOT DETECTED Final   Candida tropicalis NOT DETECTED NOT DETECTED Final   Carbapenem resistance NOT DETECTED NOT DETECTED Final   Methicillin resistance NOT DETECTED NOT DETECTED Final   Vancomycin resistance NOT DETECTED NOT DETECTED Final  MRSA PCR Screening     Status: None   Collection Time: 10/20/15  7:41 PM  Result Value Ref Range Status   MRSA by PCR NEGATIVE NEGATIVE Final    Comment:        The GeneXpert MRSA Assay (FDA approved for NASAL specimens only), is one component of a comprehensive MRSA colonization surveillance program. It is not intended to diagnose MRSA infection nor to guide or monitor treatment for MRSA infections.   CULTURE, BLOOD (ROUTINE X 2) w Reflex to PCR ID Panel     Status: None (Preliminary result)   Collection Time: 10/22/15 11:39 AM  Result Value Ref Range Status   Specimen Description BLOOD LEFT HAND  Final   Special Requests BOTTLES DRAWN AEROBIC AND ANAEROBIC 1CC  Final   Culture NO GROWTH 3 DAYS  Final   Report Status PENDING  Incomplete  Culture, expectorated sputum-assessment     Status: None   Collection Time: 10/22/15 12:00 PM  Result Value Ref Range Status   Specimen Description SPUTUM  Final   Special Requests Normal  Final   Sputum evaluation THIS SPECIMEN IS ACCEPTABLE FOR SPUTUM CULTURE  Final   Report Status 10/22/2015 FINAL  Final  Culture, respiratory (NON-Expectorated)     Status: None   Collection Time: 10/22/15 12:00 PM  Result Value Ref Range Status   Specimen  Description SPUTUM  Final   Special Requests Normal Reflexed from [id number]  Final   Gram Stain   Final    FAIR SPECIMEN - 70-80% WBCS FEW WBC SEEN RARE YEAST RARE GRAM NEGATIVE RODS    Culture LIGHT GROWTH ENTEROBACTER AEROGENES  Final   Report Status 10/24/2015 FINAL  Final   Organism ID, Bacteria ENTEROBACTER AEROGENES  Final      Susceptibility   Enterobacter aerogenes - MIC*    CEFAZOLIN >=64 RESISTANT Resistant     CEFEPIME <=1 SENSITIVE Sensitive     CEFTAZIDIME <=1 SENSITIVE Sensitive     CEFTRIAXONE <=1 SENSITIVE Sensitive     CIPROFLOXACIN <=0.25 SENSITIVE Sensitive     GENTAMICIN <=1 SENSITIVE Sensitive     IMIPENEM 2 SENSITIVE Sensitive     TRIMETH/SULFA <=20 SENSITIVE Sensitive     PIP/TAZO <=4 SENSITIVE Sensitive     * LIGHT GROWTH ENTEROBACTER AEROGENES  Urine culture     Status: None   Collection Time: 10/22/15 12:45 PM  Result Value Ref Range Status   Specimen Description URINE, RANDOM  Final   Special Requests NONE  Final   Culture NO GROWTH 2 DAYS  Final   Report Status 10/24/2015 FINAL  Final    Medications:  Scheduled:  . amiodarone  400 mg Oral BID  . antiseptic oral rinse  7 mL Mouth Rinse QID  . chlorhexidine gluconate  15 mL Mouth Rinse BID  .  feeding supplement (PRO-STAT SUGAR FREE 64)  30 mL Per Tube TID  . free water  100 mL Per Tube 3 times per day  . insulin aspart  0-15 Units Subcutaneous 6 times per day  . insulin aspart  5 Units Subcutaneous Q4H  . meropenem (MERREM) IV  500 mg Intravenous Q24H  . pantoprazole sodium  40 mg Per Tube Q24H  . potassium chloride  20 mEq Per Tube TID  . senna-docusate  2 tablet Oral BID   Infusions:  . dexmedetomidine 0.2 mcg/kg/hr (10/25/15 7026)  . feeding supplement (VITAL AF 1.2 CAL) 1,000 mL (10/24/15 1900)  . fentaNYL infusion INTRAVENOUS 350 mcg/hr (10/25/15 3785)  . norepinephrine (LEVOPHED) Adult infusion Stopped (10/23/15 0920)  . propofol (DIPRIVAN) infusion    . pureflow 2,000 mL/hr at 10/22/15  0104  . vasopressin (PITRESSIN) infusion - *FOR SHOCK* Stopped (10/23/15 8850)    Assessment: Pharmacy consulted to adjust medications for 70 yo female ICU patient requiring mechanical ventilation and CRRT. Patient previously ordered heparin drip for NSTEMI, discontinued to due to thrombocytopenia.     Plan:  1. CRRT on hold due to thrombocytopenia. No further renal adjustments warranted at this time. MD Kolluru to order intermittent HD trial today. No medications require adjustment at present.   2. 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 Now with HD, will continue meropenem to '500mg'$  IV Q24hr after HD on HD days. Will follow cultures and adjust as appropriate.    3. Potassium of 2.9 replaced orally this AM. Will recheck potassium at 1800.   4. Patient with BM this am. Will continue senna/docusate 2 tabs bid.   Pharmacy will continue to monitor and adjust per consult.    Ulice Dash, PharmD Clinical Pharmacist   10/25/2015

## 2015-10-25 NOTE — Progress Notes (Signed)
Pt has a left oral bite block in place. Pt will bite down on ETT at times

## 2015-10-25 NOTE — Progress Notes (Addendum)
[facility] at [place] NAME: April [name]    MR#:  [id number]  DATE OF BIRTH:  05-Jun-1946  SUBJECTIVE:   Remains Critically ill. Plt count improved.  Tolerated 2 hr HD treatment yesterday.  Urine output is improving.  Does not follow any commands.   REVIEW OF SYSTEMS:   Review of Systems  Unable to perform ROS: intubated   Tolerating Diet: TF Tolerating PT: intubated  DRUG ALLERGIES:   Allergies  Allergen Reactions  . Prednisone Anaphylaxis    VITALS:  Blood pressure 103/66, pulse 63, temperature 99.1 F (37.3 C), temperature source Core (Comment), resp. rate 27, height '5\' 7"'$  (1.702 m), weight 130.9 kg (288 lb 9.3 oz), SpO2 98 %.  PHYSICAL EXAMINATION:   Physical Exam  GENERAL:  70 y.o.-year-old patient lying in the bed critically ill, intubated and on the vent no acute distress. EYES: Pupils equal, round, reactive to light and accommodation. + scleral icterus.  HEENT: Head atraumatic, normocephalic. Oropharynx and nasopharynx clear. intubated and on the vent NECK:  Supple, no jugular venous distention. No thyroid enlargement, no tenderness.  LUNGS: distant breath sounds bilaterally, no wheezing, rales, rhonchi. No use of accessory muscles of respiration.  CARDIOVASCULAR: S1, S2 normal. No murmurs, rubs, or gallops.  ABDOMEN: Soft, nontender, nondistended. Bowel sounds present. No organomegaly or mass. + foley with yellow urine draining.  EXTREMITIES: No cyanosis, clubbing b/l.  + 1 pulses b/l.  NEUROLOGIC: Sedated and on the ventilator but does not follow any simple commands. PSYCHIATRIC: Difficult to assess given the fact that she is sedated and intubated.  SKIN: No rashes, lesions, masses.  LABORATORY PANEL:  CBC  Recent Labs Lab 10/25/15 0414  WBC 25.4*  HGB 10.8*  HCT 31.8*  PLT 36*    Chemistries   Recent Labs Lab 10/22/15 0503  10/24/15 0444 10/25/15 0414  NA 137  137  < > 138 142  K 4.0  4.0  < >  3.7 2.9*  CL 101  102  < > 103 104  CO2 26  24  < > 26 28  GLUCOSE 364*  356*  < > 238* 231*  BUN 35*  34*  < > 91* 82*  CREATININE 1.34*  1.34*  < > 2.95* 2.36*  CALCIUM 7.7*  7.6*  < > 7.5* 7.5*  MG 1.6*  < > 2.3  --   AST 428*  --   --   --   ALT 276*  --   --   --   ALKPHOS 209*  --   --   --   BILITOT 4.8*  --   --   --   < > = values in this interval not displayed. Cardiac Enzymes  Recent Labs Lab 10/22/15 0106  TROPONINI 0.27*   RADIOLOGY:  No results found. ASSESSMENT AND PLAN:   70 year old female presented to the emergency room after she was found lying on the floor with minimal responsiveness.  1. Severe Septic shock, hypovolemic shock: due to proteus bacteremia, from UTI -Patient's currently intubated on ventilator Day 6 - off vasopressors now.   - Continue IV Meropenem and off stress dose steroids now.  Improving. -  blood and urine culture growing Proteus Mirabilis  2. Acute renal failure: - is due to ATN in the setting of sepsis and rhabdomyolysis.  - was on CRRT but it was stopped due to thrombocytopenia.  Had short course of HD yesterday.  - cont. Care as  per Nephro.  Urine output is improving (1580/24 hrs) and will cont. To monitor.   3. Severe leukocytosis: This is due to usepsis and also likely due to dehydration/anuria. - improving w/ IV abx and will cont. To monitor.   4. Atrial fibrillation with RVR - rate controlled now.  Off amio gtt and now on Oral amio.  - Echo showing normal LV function w/ EF of 55%.    5. DM-2 , new onset -HbA1c of 7.7 -No further episodes of hypoglycemia. Continue insulin every 4 hours and also sliding scale insulin for now. BS stable.   6. Elevated transaminases and jaundice -suspect Shock liver.  Will follow LFT's in a.m. And they are improving.   7. Acute rhabdomyolysis secondary to patient being found down on the floor:  - CPK's have trended down.   8. Severe TCP came in with 62---46--23--12--14--> 16-->36  today.  - likely consumption of plts given severe sepsis ,?DIC, ? Related to CRRT which has been stopped.  - no active bleeding and follow Plt count.   9. Hypokalemia -  Supplemented and will repeat level in a.m.   She remains critically ill with multiorgan failure.  CODE STATUS: full  DVT Prophylaxis: SCD/TED  TOTAL critical TIME TAKING CARE OF THIS PATIENT: 30 minutes.   Note: This dictation was prepared with Dragon dictation along with smaller phrase technology. Any transcriptional errors that result from this process are unintentional.  Henreitta Leber M.D on 10/25/2015 at 2:08 PM  Between 7am to 6pm - Pager - [phone]  After 6pm go to [email or web address] - password [email or web address] [facility]  Office  [phone]  CC: Primary care physician; Albina Billet, MD

## 2015-10-26 ENCOUNTER — Inpatient Hospital Stay: Payer: Medicare HMO

## 2015-10-26 LAB — BLOOD GAS, ARTERIAL
Acid-Base Excess: 4 mmol/L — ABNORMAL HIGH (ref 0.0–3.0)
BICARBONATE: 27.4 meq/L (ref 21.0–28.0)
FIO2: 0.6
Mechanical Rate: 25
O2 SAT: 95.5 %
PATIENT TEMPERATURE: 37
PCO2 ART: 36 mmHg (ref 32.0–48.0)
PEEP/CPAP: 5 cmH2O
PO2 ART: 72 mmHg — AB (ref 83.0–108.0)
VT: 550 mL
pH, Arterial: 7.49 — ABNORMAL HIGH (ref 7.350–7.450)

## 2015-10-26 LAB — MAGNESIUM: MAGNESIUM: 2.3 mg/dL (ref 1.7–2.4)

## 2015-10-26 LAB — COMPREHENSIVE METABOLIC PANEL
ALBUMIN: 1.7 g/dL — AB (ref 3.5–5.0)
ALT: 174 U/L — ABNORMAL HIGH (ref 14–54)
ANION GAP: 6 (ref 5–15)
AST: 92 U/L — AB (ref 15–41)
Alkaline Phosphatase: 188 U/L — ABNORMAL HIGH (ref 38–126)
BILIRUBIN TOTAL: 2.1 mg/dL — AB (ref 0.3–1.2)
BUN: 89 mg/dL — ABNORMAL HIGH (ref 6–20)
CHLORIDE: 110 mmol/L (ref 101–111)
CO2: 29 mmol/L (ref 22–32)
Calcium: 7.5 mg/dL — ABNORMAL LOW (ref 8.9–10.3)
Creatinine, Ser: 2.11 mg/dL — ABNORMAL HIGH (ref 0.44–1.00)
GFR calc Af Amer: 26 mL/min — ABNORMAL LOW (ref >60)
GFR calc non Af Amer: 23 mL/min — ABNORMAL LOW (ref >60)
GLUCOSE: 232 mg/dL — AB (ref 65–99)
POTASSIUM: 3.2 mmol/L — AB (ref 3.5–5.1)
Sodium: 145 mmol/L (ref 135–145)
TOTAL PROTEIN: 5.3 g/dL — AB (ref 6.5–8.1)

## 2015-10-26 LAB — GLUCOSE, CAPILLARY
GLUCOSE-CAPILLARY: 273 mg/dL — AB (ref 65–99)
GLUCOSE-CAPILLARY: 250 mg/dL — AB (ref 65–99)
GLUCOSE-CAPILLARY: 172 mg/dL — AB (ref 65–99)
GLUCOSE-CAPILLARY: 191 mg/dL — AB (ref 65–99)
GLUCOSE-CAPILLARY: 139 mg/dL — AB (ref 65–99)
GLUCOSE-CAPILLARY: 142 mg/dL — AB (ref 65–99)
GLUCOSE-CAPILLARY: 142 mg/dL — AB (ref 65–99)
GLUCOSE-CAPILLARY: 143 mg/dL — AB (ref 65–99)
GLUCOSE-CAPILLARY: 128 mg/dL — AB (ref 65–99)
Glucose-Capillary: 215 mg/dL — ABNORMAL HIGH (ref 65–99)
Glucose-Capillary: 243 mg/dL — ABNORMAL HIGH (ref 65–99)
Glucose-Capillary: 254 mg/dL — ABNORMAL HIGH (ref 65–99)
Glucose-Capillary: 241 mg/dL — ABNORMAL HIGH (ref 65–99)
Glucose-Capillary: 202 mg/dL — ABNORMAL HIGH (ref 65–99)
Glucose-Capillary: 171 mg/dL — ABNORMAL HIGH (ref 65–99)
Glucose-Capillary: 139 mg/dL — ABNORMAL HIGH (ref 65–99)

## 2015-10-26 LAB — CBC
HEMATOCRIT: 32.2 % — AB (ref 35.0–47.0)
Hemoglobin: 10.7 g/dL — ABNORMAL LOW (ref 12.0–16.0)
MCH: 29.7 pg (ref 26.0–34.0)
MCHC: 33.2 g/dL (ref 32.0–36.0)
MCV: 89.3 fL (ref 80.0–100.0)
PLATELETS: 59 10*3/uL — AB (ref 150–440)
RBC: 3.61 MIL/uL — ABNORMAL LOW (ref 3.80–5.20)
RDW: 14.3 % (ref 11.5–14.5)
WBC: 23.6 10*3/uL — AB (ref 3.6–11.0)

## 2015-10-26 LAB — PHOSPHORUS: PHOSPHORUS: 4.5 mg/dL (ref 2.5–4.6)

## 2015-10-26 MED ORDER — SODIUM CHLORIDE 0.9 % IV SOLN
INTRAVENOUS | Status: DC
  Administered 2015-10-26: 1.9 [IU]/h via INTRAVENOUS
  Administered 2015-10-26: 3.8 [IU]/h via INTRAVENOUS
  Filled 2015-10-26: qty 2.5

## 2015-10-26 MED ORDER — SENNOSIDES-DOCUSATE SODIUM 8.6-50 MG PO TABS
1.0000 | ORAL_TABLET | Freq: Two times a day (BID) | ORAL | Status: DC
  Administered 2015-10-26 – 2015-10-30 (×8): 1 via ORAL
  Filled 2015-10-26 (×8): qty 1

## 2015-10-26 MED ORDER — MIDAZOLAM HCL 2 MG/2ML IJ SOLN
2.0000 mg | INTRAMUSCULAR | Status: DC | PRN
  Administered 2015-10-26 (×3): 2 mg via INTRAVENOUS
  Filled 2015-10-26 (×3): qty 2

## 2015-10-26 MED ORDER — ACETAMINOPHEN 325 MG PO TABS: 325.0000 mg | ORAL_TABLET | Freq: Four times a day (QID) | ORAL | Status: DC

## 2015-10-26 MED ORDER — LORAZEPAM 2 MG/ML IJ SOLN
2.0000 mg | INTRAMUSCULAR | Status: DC | PRN
  Administered 2015-10-26 – 2015-10-27 (×4): 2 mg via INTRAVENOUS
  Filled 2015-10-26 (×4): qty 1

## 2015-10-26 MED ORDER — DEXTROSE 5 % IV SOLN
2.0000 g | INTRAVENOUS | Status: DC
  Administered 2015-10-26 – 2015-10-27 (×2): 2 g via INTRAVENOUS
  Filled 2015-10-26 (×3): qty 2

## 2015-10-26 MED ORDER — OXYCODONE HCL 5 MG PO TABS
5.0000 mg | ORAL_TABLET | Freq: Four times a day (QID) | ORAL | Status: DC
  Administered 2015-10-26 – 2015-10-29 (×13): 5 mg via ORAL
  Filled 2015-10-26 (×15): qty 1

## 2015-10-26 MED ORDER — LORAZEPAM 2 MG/ML IJ SOLN
2.0000 mg | Freq: Once | INTRAMUSCULAR | Status: AC
  Administered 2015-10-26: 2 mg via INTRAVENOUS

## 2015-10-26 MED ORDER — POTASSIUM CHLORIDE 20 MEQ PO PACK
40.0000 meq | PACK | Freq: Once | ORAL | Status: AC
  Administered 2015-10-26: 40 meq via ORAL
  Filled 2015-10-26: qty 2

## 2015-10-26 MED ORDER — LORAZEPAM 2 MG/ML IJ SOLN
INTRAMUSCULAR | Status: AC
  Administered 2015-10-26: 2 mg via INTRAVENOUS
  Filled 2015-10-26: qty 1

## 2015-10-26 NOTE — Progress Notes (Signed)
Inpatient Diabetes Program Recommendations  AACE/ADA: New Consensus Statement on Inpatient Glycemic Control (2015)  Target Ranges:  Prepandial:   less than 140 mg/dL      Peak postprandial:   less than 180 mg/dL (1-2 hours)      Critically ill patients:  140 - 180 mg/dL  Results for April [name], April [name] (MRN [id number]) as of 10/26/2015 08:11  Ref. Range 10/25/2015 03:50 10/25/2015 08:03 10/25/2015 11:42 10/25/2015 15:38 10/25/2015 19:49 10/25/2015 21:25 10/25/2015 23:48 10/26/2015 04:31  Glucose-Capillary Latest Ref Range: 65-99 mg/dL 199 (H) 210 (H) 199 (H) 165 (H) 161 (H) 162 (H) 224 (H) 215 (H)   Review of Glycemic Control  Current orders for Inpatient glycemic control: Novolog 0-15 units Q4H, Novolog 5 units Q4H (for tube feeding coverage)  Inpatient Diabetes Program Recommendations: Insulin - Basal: Over the past 24 hours, patient has received a total of Novolog 62 units (35 units for tube feeding coverage and 27 units for correction). Please consider ordering low dose basal insulin; recommend starting with Levemir 13 units Q24H (based on 129 kg x 0.1 units). HgbA1C: A1C 7.7% on 10/21/15.  Thanks, Barnie Alderman, RN, MSN, CDE Diabetes Coordinator Inpatient Diabetes Program [phone] (Team Pager from [place] to [place]) [phone] ([facility] office) [phone] [facility] office) [phone] [facility] [place] office)

## 2015-10-26 NOTE — Progress Notes (Signed)
[facility] at [place] NAME: April [name]    MR#:  [id number]  DATE OF BIRTH:  1946-08-07  SUBJECTIVE:   Remains Critically ill. Plt count improving. Urine output is improving.  Follows some simple commands.   REVIEW OF SYSTEMS:   Review of Systems  Unable to perform ROS: intubated   Tolerating Diet: TF Tolerating PT: intubated  DRUG ALLERGIES:   Allergies  Allergen Reactions  . Prednisone Anaphylaxis    VITALS:  Blood pressure 141/62, pulse 106, temperature 97.3 F (36.3 C), temperature source Core (Comment), resp. rate 23, height '5\' 7"'$  (1.702 m), weight 129.5 kg (285 lb 7.9 oz), SpO2 93 %.  PHYSICAL EXAMINATION:   Physical Exam  GENERAL:  70 y.o.-year-old patient lying in the bed critically ill, intubated and on the vent no acute distress. EYES: Pupils equal, round, reactive to light and accommodation. + scleral icterus.  HEENT: Head atraumatic, normocephalic. Oropharynx and nasopharynx clear. intubated and on the vent NECK:  Supple, no jugular venous distention. No thyroid enlargement, no tenderness.  LUNGS: distant breath sounds bilaterally, no wheezing, rales, rhonchi. No use of accessory muscles of respiration.  CARDIOVASCULAR: S1, S2 normal. No murmurs, rubs, or gallops.  ABDOMEN: Soft, nontender, nondistended. Bowel sounds present. No organomegaly or mass. + foley with yellow urine draining.  EXTREMITIES: No cyanosis, clubbing b/l.  + 1 pulses b/l.  NEUROLOGIC: Sedated and on the ventilator but does not follow any simple commands. PSYCHIATRIC: Difficult to assess given the fact that she is sedated and intubated.  SKIN: No rashes, lesions, masses.  LABORATORY PANEL:  CBC  Recent Labs Lab 10/26/15 0400  WBC 23.6*  HGB 10.7*  HCT 32.2*  PLT 59*    Chemistries   Recent Labs Lab 10/26/15 0400  NA 145  K 3.2*  CL 110  CO2 29  GLUCOSE 232*  BUN 89*  CREATININE 2.11*  CALCIUM 7.5*  MG 2.3  AST 92*   ALT 174*  ALKPHOS 188*  BILITOT 2.1*   Cardiac Enzymes  Recent Labs Lab 10/22/15 0106  TROPONINI 0.27*   RADIOLOGY:  Dg Chest 1 View  10/26/2015  CLINICAL DATA:  Unresponsive.  Intubation . EXAM: CHEST 1 VIEW COMPARISON:  10/23/2015. FINDINGS: Endotracheal tube, NG tube, right IJ line, left IJ line in stable position. Cardiomegaly with bilateral pulmonary alveolar infiltrates consistent congestive heart failure bilateral pulmonary edema. Low lung volumes with basilar atelectasis. Small bilateral pleural effusions. No pneumothorax. IMPRESSION: 1. Lines and tubes in stable position. 2. Congestive heart failure with bilateral pulmonary edema and small pleural effusions. Slight interim progression from prior exam. 3. Lung volumes with basilar atelectasis. Electronically Signed   By: Marcello Moores  Register   On: 10/26/2015 07:24   ASSESSMENT AND PLAN:   70 year old female presented to the emergency room after she was found lying on the floor with minimal responsiveness.  1. Severe Septic shock, hypovolemic shock: due to proteus bacteremia, from UTI - Patient's currently intubated on ventilator Day 7 - off vasopressors now.   - switched from IV Meropenem to IV Rocephin and and will cont.  - off stress dose steroids now.  Improving. -  blood and urine culture growing Proteus Mirabilis  2. Acute renal failure: - is due to ATN in the setting of sepsis and rhabdomyolysis.  - was on CRRT but it was stopped due to thrombocytopenia.  Had short course of HD 1/28 - urine output is improving and > 2L in the past 24  hrs.  - no acute indication for HD and cont. Care as per Nephro.   3. Severe leukocytosis: This is due to usepsis and also likely due to dehydration/anuria. - improving w/ IV abx and will cont. To monitor.   4. Atrial fibrillation with RVR - rate controlled now.  Off amio gtt and now on Oral amio.  - Echo showing normal LV function w/ EF of 55%.    5. DM-2 , new onset - HgA1c of 7.7 -  No further episodes of hypoglycemia. Continue insulin every 4 hours and also sliding scale insulin for now. BS stable.   6. Elevated transaminases and jaundice - due to Shock liver.  LFT's improving and will cont. To monitor.   7. Acute rhabdomyolysis secondary to patient being found down on the floor:  - CPK's have trended down. Resolved.   8. Thrombocytopenia - came in with 62---46--23--12--14--> 16-->36-->59 today.  - likely consumption of plts given severe sepsis ,?DIC, ? Related to CRRT which has been stopped.  - no active bleeding and cont. To follow Plt count.   9. Hypokalemia -  Supplemented and improving.   She remains critically ill with multiorgan failure.  CODE STATUS: full  DVT Prophylaxis: SCD/TED  TOTAL critical TIME TAKING CARE OF THIS PATIENT: 30 minutes.   Note: This dictation was prepared with Dragon dictation along with smaller phrase technology. Any transcriptional errors that result from this process are unintentional.  Henreitta Leber M.D on 10/26/2015 at 3:05 PM  Between 7am to 6pm - Pager - [phone]  After 6pm go to [email or web address] - password [email or web address] [facility]  Office  [phone]  CC: Primary care physician; Albina Billet, MD

## 2015-10-26 NOTE — Progress Notes (Signed)
PULMONARY / CRITICAL CARE MEDICINE   Name: April [name] MRN: [id number] DOB: [birth year]    ADMISSION DATE:  10/20/2015  Consulting physician - Dr. Fritzi Mandes   ICU day#6  BRIEF HISTORY: 70 y.o. female with unknown past medical history was picked up by EMS today after being found at her house laying on the floor minimally responsive. Per coworkers she has not been to work for 2 days and when they checked on her they found her lying in the floor minimally responsive. EMS was called. Upon arrival to the ED, she only withdrew to pain, due to low GCS and inability to protect her airway she was intubated.   SUBJECTIVE:  Remains intubated, attempt wean sedation again today remains encephalopathy BUN 89 Intermittent HD as per nephrology Spoke with family on Saturday and they were updated.  STUDIES:  1/24 CT head and cspine - no acute findings 1/25 CT A/P - RIGHT hydronephrosis question UPJ obstruction; in the setting of leukocytosis, recommend correlation with urinalysis to exclude urinary tract infection.  SIGNIFICANT EVENTS: 1/24>>found down, brought to [facility] ED, intubated and started on CRRT 1/24>>RIJ Vascath 1/25>>LIJ CVL, placed by Vascular (Dr. Lucky Cowboy).   VITAL SIGNS: Temp:  [98.2 F (36.8 C)-100.2 F (37.9 C)] 98.4 F (36.9 C) (01/30 0630) Pulse Rate:  [51-80] 62 (01/30 0630) Resp:  [24-30] 25 (01/30 0630) BP: (87-124)/(47-86) 97/53 mmHg (01/30 0630) SpO2:  [81 %-100 %] 100 % (01/30 0630) FiO2 (%):  [30 %-60 %] 60 % (01/30 0400) Weight:  [285 lb 7.9 oz (129.5 kg)] 285 lb 7.9 oz (129.5 kg) (01/30 0517) HEMODYNAMICS:   VENTILATOR SETTINGS: Vent Mode:  [-] PRVC FiO2 (%):  [30 %-60 %] 60 % Set Rate:  [25 bmp-30 bmp] 25 bmp Vt Set:  [500 mL-550 mL] 550 mL PEEP:  [0 cmH20-5 cmH20] 0 cmH20 INTAKE / OUTPUT:  Intake/Output Summary (Last 24 hours) at 10/26/15 0846 Last data filed at 10/26/15 0600  Gross per 24 hour  Intake 3156.9 ml  Output   1925 ml  Net 1231.9 ml     Review of Systems  Unable to perform ROS: intubated    Physical Exam  Constitutional: She appears distressed.  HENT:  Head: Normocephalic and atraumatic.  Right Ear: External ear normal.  Left Ear: External ear normal.  Eyes: EOM are normal. Pupils are equal, round, and reactive to light.  Neck: Normal range of motion. No JVD present. No tracheal deviation present. No thyromegaly present.  Cardiovascular: Normal rate, regular rhythm and normal heart sounds.   No murmur heard. Pulmonary/Chest: She has no wheezes. She has no rales.  Intubated, coarse upper airway sounds  Abdominal: Soft. Bowel sounds are normal. She exhibits no distension.  Musculoskeletal: She exhibits edema. She exhibits no tenderness.  Neurological:  Intubated and sedated Withdraws from pain  Skin: Skin is dry.  Skin mottling has improved, now limited b/l legs, legs warm today  Nursing note and vitals reviewed.    LABS:  CBC  Recent Labs Lab 10/24/15 0444 10/25/15 0414 10/26/15 0400  WBC 28.5* 25.4* 23.6*  HGB 11.4* 10.8* 10.7*  HCT 33.9* 31.8* 32.2*  PLT 16* 36* 59*   Coag's  Recent Labs Lab 10/20/15 0957 10/21/15 0023  APTT 35  --   INR 1.46 1.52   BMET  Recent Labs Lab 10/24/15 0444 10/25/15 0414 10/25/15 1709 10/26/15 0400  NA 138 142  --  145  K 3.7 2.9* 3.3* 3.2*  CL 103 104  --  110  CO2 26 28  --  29  BUN 91* 82*  --  89*  CREATININE 2.95* 2.36*  --  2.11*  GLUCOSE 238* 231*  --  232*   Electrolytes  Recent Labs Lab 10/22/15 2053 10/23/15 0449 10/24/15 0444 10/25/15 0414 10/26/15 0400  CALCIUM 7.8* 7.7* 7.5* 7.5* 7.5*  MG  --  2.2 2.3  --  2.3  PHOS 2.8 2.8 3.2  --   --    Sepsis Markers  Recent Labs Lab 10/20/15 1254 10/21/15 0515 10/21/15 1753  LATICACIDVEN 5.9* 3.7* 2.9*   ABG  Recent Labs Lab 10/21/15 0847 10/25/15 2305 10/26/15 0401  PHART 7.35 7.56* 7.49*  PCO2ART 33 29* 36  PO2ART 193* 96 72*   Liver Enzymes  Recent Labs Lab  10/21/15 0515  10/22/15 0503  10/23/15 0449 10/24/15 0444 10/26/15 0400  AST 312*  --  428*  --   --   --  92*  ALT 176*  --  276*  --   --   --  174*  ALKPHOS 391*  --  209*  --   --   --  188*  BILITOT 3.1*  --  4.8*  --   --   --  2.1*  ALBUMIN 2.1*  < > 1.8*  1.8*  < > 1.7* 1.7* 1.7*  < > = values in this interval not displayed. Cardiac Enzymes  Recent Labs Lab 10/20/15 2242 10/21/15 1755 10/22/15 0106  TROPONINI 1.08* 0.44* 0.27*   Glucose  Recent Labs Lab 10/25/15 1538 10/25/15 1949 10/25/15 2125 10/25/15 2348 10/26/15 0431 10/26/15 0827  GLUCAP 165* 161* 162* 224* 215* 243*    Imaging Dg Chest 1 View  10/26/2015  CLINICAL DATA:  Unresponsive.  Intubation . EXAM: CHEST 1 VIEW COMPARISON:  10/23/2015. FINDINGS: Endotracheal tube, NG tube, right IJ line, left IJ line in stable position. Cardiomegaly with bilateral pulmonary alveolar infiltrates consistent congestive heart failure bilateral pulmonary edema. Low lung volumes with basilar atelectasis. Small bilateral pleural effusions. No pneumothorax. IMPRESSION: 1. Lines and tubes in stable position. 2. Congestive heart failure with bilateral pulmonary edema and small pleural effusions. Slight interim progression from prior exam. 3. Lung volumes with basilar atelectasis. Electronically Signed   By: Marcello Moores  Register   On: 10/26/2015 07:24    Cultures: BCx2 x2 1/24>>Proteus UC 1/24>>Proteus Sputum 1/26>> BC x 2 1/26>> UC 1/26>>  Antibiotics: Vanc 1/24>>1/25 Zosyn 1/24>>1/25 Ceftriaxone 1/25>>1/27 Meropenem   Lines: RIJ Vascath 1/24>> LIJ CVL 1/25>>  ASSESSMENT / PLAN: 70 yo Female admitted for acute resp failure from acute septic shock with  NSTEMI, with proteus bacteremia, failure to wean from vent due to encephalopathy and resp muscle fatigue  PULMONARY Respiratory failure - hypoxic Septic shock -slowly  improving P:  - cont with MV - prn ABG - wean MV as tolerated - maintain sats  >90% -SAT/SBT daily - wean fentanyl Could not attempt weaning trial due to continued reduced mental status.  CARDIOVASCULAR CVL - RIJ VASCATH, LIJ CVL Shock - septic hypotension NSTEMI Afib - started on amiodarone 1/24  P:  - maintain MAP >65 - vasopressors as needed - currently weaned off.  - monitor BP - trend CE - heparin gtt on hold due to low plt - mostly likely demand ischemia - EKG reviewed, no sig ST changes.  - cont with amiodarone -follow up  cardiology consult   RENAL Anuria ARF UTI ?Rhabdomyolysis - CK improving, unlikely rhabdo Right hydronephrosis Proteus Bacteremia, Proteus UTI P:  -monitor Cr -  HD today - follow up Ur Cx -current CK trending down, cont to follow, IVF -Etoh, salicylate levels normal  - U/S of Right Kidney with mild R hydro, WBC improving, pressors are being wean down  -CT A/P was done prior to foley placement, bladder is now decompressed.  - meropenem in this severe septic patient.   GASTROINTESTINAL SUP - PPI Elevated LFTs - trend TF  HEMATOLOGIC Leukocytosis Thrombocytopenia - lower today P:  -most likely related to septic shock - source urine, now with Proteus Bacteremia -follow CBC -cont abx -stopped heparin 1/24 HD as needed  INFECTIOUS Sepsis - Proteus UTI and Bacteremia P:  - cont with current abx - Bld and UC Cx with Protues, narrowed abx to ceftriaxone on 1/25 - broaden abx to meropenem on 1/27  ENDOCRINE ICU hypo/hyperglycemia protocol  NEUROLOGIC A: Acute encephalopathy P:  RASS goal: -1 - metabolic encephalopathy - cont to monitor neuro status.    I have personally obtained a history, examined the patient, evaluated Pertinent laboratory and [employer]/imaging results, and  formulated the assessment and plan   The Patient requires high complexity decision making for assessment and support, frequent evaluation and titration of therapies, application of advanced monitoring technologies and  extensive interpretation of multiple databases. Critical Care Time devoted to patient care services described in this note is 35 minutes.   Overall, patient is critically ill, prognosis is guarded.  Patient with Multiorgan failure and at high risk for cardiac arrest and death.    Corrin Parker, M.D.  [facility]  Medical Director [place] Director [facility] Cardio-Pulmonary Department

## 2015-10-26 NOTE — Progress Notes (Signed)
[facility] Progress Note Patient Name: April [name] DOB: [birth year] MRN: [id number]   Date of Service  10/26/2015  HPI/Events of Note  Called to rewrite Versed PRN order from bolus via infusion to IVP.  eICU Interventions  Will order: 1. D/C Versed bolus via infusion. 2. Versed 2 mg IV Q 1 hour PRN agitation or sedation.      Intervention Category Minor Interventions: Agitation / anxiety - evaluation and management  Sommer,Steven Eugene 10/26/2015, 12:39 AM

## 2015-10-26 NOTE — Progress Notes (Signed)
[facility] NOTE  Pharmacy Consult for HD medication management, Ceftriaxone Dosing (day 7/14), Electrolyte Dosing, Constipation Prevention Indication: HD/ Proteus Bacteremia     Allergies  Allergen Reactions  . Prednisone Anaphylaxis    Patient Measurements: Height: '5\' 7"'$  (170.2 cm) Weight: 285 lb 7.9 oz (129.5 kg) IBW/kg (Calculated) : 61.6   Vital Signs: Temp: 97.3 F (36.3 C) (01/30 1400) Temp Source: Core (Comment) (01/30 0400) BP: 141/62 mmHg (01/30 1400) Pulse Rate: 106 (01/30 1400) Intake/Output from previous day: 01/29 0701 - 01/30 0700 In: 3593.3 [I.V.:1653.3; NG/GT:1590; IV Piggyback:350] Out: 2125 [Urine:2125] Intake/Output from this shift: Total I/O In: 205.7 [I.V.:155.7; NG/GT:50] Out: -  Vent settings for last 24 hours: Vent Mode:  [-] PRVC FiO2 (%):  [30 %-60 %] 60 % Set Rate:  [25 bmp-30 bmp] 25 bmp Vt Set:  [500 mL-550 mL] 550 mL PEEP:  [0 cmH20-5 cmH20] 0 cmH20  Labs:  Recent Labs  10/24/15 0444 10/25/15 0414 10/26/15 0400  WBC 28.5* 25.4* 23.6*  HGB 11.4* 10.8* 10.7*  HCT 33.9* 31.8* 32.2*  PLT 16* 36* 59*  CREATININE 2.95* 2.36* 2.11*  MG 2.3  --  2.3  PHOS 3.2  --  4.5  ALBUMIN 1.7*  --  1.7*  PROT  --   --  5.3*  AST  --   --  92*  ALT  --   --  174*  ALKPHOS  --   --  188*  BILITOT  --   --  2.1*   Estimated Creatinine Clearance: 35.3 mL/min (by C-G formula based on Cr of 2.11).   Recent Labs  10/26/15 1225 10/26/15 1339 10/26/15 1506  GLUCAP 250* 241* 202*    Microbiology: Recent Results (from the past 720 hour(s))  Urine culture     Status: None   Collection Time: 10/20/15  9:57 AM  Result Value Ref Range Status   Specimen Description URINE, RANDOM  Final   Special Requests NONE  Final   Culture >=100,000 COLONIES/mL PROTEUS MIRABILIS  Final   Report Status 10/22/2015 FINAL  Final   Organism ID, Bacteria PROTEUS MIRABILIS  Final      Susceptibility   Proteus mirabilis - MIC*    AMPICILLIN  <=2 SENSITIVE Sensitive     GENTAMICIN <=1 SENSITIVE Sensitive     CEFTRIAXONE Value in next row Sensitive      SENSITIVE<=1    CIPROFLOXACIN Value in next row Sensitive      SENSITIVE<=0.25    IMIPENEM Value in next row Sensitive      SENSITIVE2    NITROFURANTOIN Value in next row Resistant      RESISTANT128    TRIMETH/SULFA Value in next row Sensitive      SENSITIVE<=20    PIP/TAZO Value in next row Sensitive      SENSITIVE<=4    AMPICILLIN/SULBACTAM Value in next row Sensitive      SENSITIVE<=2    * >=100,000 COLONIES/mL PROTEUS MIRABILIS  Culture, blood (routine x 2)     Status: None   Collection Time: 10/20/15  9:57 AM  Result Value Ref Range Status   Specimen Description BLOOD RIGHT WRIST  Final   Special Requests   Final    BOTTLES DRAWN AEROBIC AND ANAEROBIC ANA 1ML AER 4ML   Culture  Setup Time   Final    GRAM NEGATIVE RODS IN BOTH AEROBIC AND ANAEROBIC BOTTLES CRITICAL RESULT CALLED TO, READ BACK BY AND VERIFIED WITH: MATT MCBANE ON 10/21/15 AT 0005  [place] CONFIRMED BY [facility]    Culture   Final    PROTEUS MIRABILIS IN BOTH AEROBIC AND ANAEROBIC BOTTLES    Report Status 10/22/2015 FINAL  Final   Organism ID, Bacteria PROTEUS MIRABILIS  Final      Susceptibility   Proteus mirabilis - MIC*    AMPICILLIN/SULBACTAM Value in next row Sensitive      SENSITIVE<=2    PIP/TAZO Value in next row Sensitive      SENSITIVE<=4    CEFTRIAXONE Value in next row Sensitive      SENSITIVE<=1    IMIPENEM Value in next row Sensitive      SENSITIVE2    GENTAMICIN Value in next row Sensitive      SENSITIVE<=1    CIPROFLOXACIN Value in next row Sensitive      SENSITIVE<=0.25    * PROTEUS MIRABILIS  Culture, blood (routine x 2)     Status: None   Collection Time: 10/20/15  9:57 AM  Result Value Ref Range Status   Specimen Description BLOOD LEFT HAND  Final   Special Requests   Final    BOTTLES DRAWN AEROBIC AND ANAEROBIC AER 3ML ANA 2ML   Culture  Setup Time   Final    GRAM NEGATIVE  RODS IN BOTH AEROBIC AND ANAEROBIC BOTTLES CRITICAL VALUE NOTED.  VALUE IS CONSISTENT WITH PREVIOUSLY REPORTED AND CALLED VALUE.    Culture   Final    PROTEUS MIRABILIS IN BOTH AEROBIC AND ANAEROBIC BOTTLES    Report Status 10/22/2015 FINAL  Final   Organism ID, Bacteria PROTEUS MIRABILIS  Final      Susceptibility   Proteus mirabilis - MIC*    AMPICILLIN Value in next row Sensitive      SENSITIVE<=2    PIP/TAZO Value in next row Sensitive      SENSITIVE<=4    CEFTAZIDIME Value in next row Sensitive      SENSITIVE<=1    CEFTRIAXONE Value in next row Sensitive      SENSITIVE<=1    CEFEPIME Value in next row Sensitive      SENSITIVE<=1    IMIPENEM Value in next row Sensitive      SENSITIVE2    GENTAMICIN Value in next row Sensitive      SENSITIVE<=1    CIPROFLOXACIN Value in next row Sensitive      SENSITIVE<=0.25    * PROTEUS MIRABILIS  Blood Culture ID Panel (Reflexed)     Status: Abnormal   Collection Time: 10/20/15  9:57 AM  Result Value Ref Range Status   Enterococcus species NOT DETECTED NOT DETECTED Final   Listeria monocytogenes NOT DETECTED NOT DETECTED Final   Staphylococcus species NOT DETECTED NOT DETECTED Final   Staphylococcus aureus NOT DETECTED NOT DETECTED Final   Streptococcus species NOT DETECTED NOT DETECTED Final   Streptococcus agalactiae NOT DETECTED NOT DETECTED Final   Streptococcus pneumoniae NOT DETECTED NOT DETECTED Final   Streptococcus pyogenes NOT DETECTED NOT DETECTED Final   Acinetobacter baumannii NOT DETECTED NOT DETECTED Final   Enterobacteriaceae species DETECTED (A) NOT DETECTED Final    Comment: CRITICAL RESULT CALLED TO, READ BACK BY AND VERIFIED WITH: MATT MCBANE ON 10/21/15 AT 0005 [place]    Enterobacter cloacae complex NOT DETECTED NOT DETECTED Final   Escherichia coli NOT DETECTED NOT DETECTED Final   Klebsiella oxytoca NOT DETECTED NOT DETECTED Final   Klebsiella pneumoniae NOT DETECTED NOT DETECTED Final   Proteus species (A)  NOT DETECTED Final    CRITICAL RESULT  CALLED TO, READ BACK BY AND VERIFIED WITH:    Comment: MATT MCBANE ON 10/21/15 AT 0005 [place]   Serratia marcescens NOT DETECTED NOT DETECTED Final   Haemophilus influenzae NOT DETECTED NOT DETECTED Final   Neisseria meningitidis NOT DETECTED NOT DETECTED Final   Pseudomonas aeruginosa NOT DETECTED NOT DETECTED Final   Candida albicans NOT DETECTED NOT DETECTED Final   Candida glabrata NOT DETECTED NOT DETECTED Final   Candida krusei NOT DETECTED NOT DETECTED Final   Candida parapsilosis NOT DETECTED NOT DETECTED Final   Candida tropicalis NOT DETECTED NOT DETECTED Final   Carbapenem resistance NOT DETECTED NOT DETECTED Final   Methicillin resistance NOT DETECTED NOT DETECTED Final   Vancomycin resistance NOT DETECTED NOT DETECTED Final  MRSA PCR Screening     Status: None   Collection Time: 10/20/15  7:41 PM  Result Value Ref Range Status   MRSA by PCR NEGATIVE NEGATIVE Final    Comment:        The GeneXpert MRSA Assay (FDA approved for NASAL specimens only), is one component of a comprehensive MRSA colonization surveillance program. It is not intended to diagnose MRSA infection nor to guide or monitor treatment for MRSA infections.   CULTURE, BLOOD (ROUTINE X 2) w Reflex to PCR ID Panel     Status: None (Preliminary result)   Collection Time: 10/22/15 11:39 AM  Result Value Ref Range Status   Specimen Description BLOOD LEFT HAND  Final   Special Requests BOTTLES DRAWN AEROBIC AND ANAEROBIC 1CC  Final   Culture NO GROWTH 4 DAYS  Final   Report Status PENDING  Incomplete  Culture, expectorated sputum-assessment     Status: None   Collection Time: 10/22/15 12:00 PM  Result Value Ref Range Status   Specimen Description SPUTUM  Final   Special Requests Normal  Final   Sputum evaluation THIS SPECIMEN IS ACCEPTABLE FOR SPUTUM CULTURE  Final   Report Status 10/22/2015 FINAL  Final  Culture, respiratory (NON-Expectorated)     Status: None    Collection Time: 10/22/15 12:00 PM  Result Value Ref Range Status   Specimen Description SPUTUM  Final   Special Requests Normal Reflexed from [id number]  Final   Gram Stain   Final    FAIR SPECIMEN - 70-80% WBCS FEW WBC SEEN RARE YEAST RARE GRAM NEGATIVE RODS    Culture LIGHT GROWTH ENTEROBACTER AEROGENES  Final   Report Status 10/24/2015 FINAL  Final   Organism ID, Bacteria ENTEROBACTER AEROGENES  Final      Susceptibility   Enterobacter aerogenes - MIC*    CEFAZOLIN >=64 RESISTANT Resistant     CEFEPIME <=1 SENSITIVE Sensitive     CEFTAZIDIME <=1 SENSITIVE Sensitive     CEFTRIAXONE <=1 SENSITIVE Sensitive     CIPROFLOXACIN <=0.25 SENSITIVE Sensitive     GENTAMICIN <=1 SENSITIVE Sensitive     IMIPENEM 2 SENSITIVE Sensitive     TRIMETH/SULFA <=20 SENSITIVE Sensitive     PIP/TAZO <=4 SENSITIVE Sensitive     * LIGHT GROWTH ENTEROBACTER AEROGENES  Urine culture     Status: None   Collection Time: 10/22/15 12:45 PM  Result Value Ref Range Status   Specimen Description URINE, RANDOM  Final   Special Requests NONE  Final   Culture NO GROWTH 2 DAYS  Final   Report Status 10/24/2015 FINAL  Final    Medications:  Scheduled:  . amiodarone  400 mg Oral BID  . antiseptic oral rinse  7 mL Mouth Rinse 10 times  per day  . cefTRIAXone (ROCEPHIN)  IV  2 g Intravenous Q24H  . chlorhexidine gluconate  15 mL Mouth Rinse BID  . feeding supplement (PRO-STAT SUGAR FREE 64)  30 mL Per Tube TID  . free water  100 mL Per Tube 3 times per day  . ipratropium-albuterol  3 mL Nebulization Q6H  . oxyCODONE  5 mg Oral Q6H  . pantoprazole sodium  40 mg Per Tube Q24H  . senna-docusate  2 tablet Oral BID   Infusions:  . feeding supplement (VITAL AF 1.2 CAL) 1,000 mL (10/26/15 0700)  . fentaNYL infusion INTRAVENOUS 200 mcg/hr (10/26/15 0907)  . insulin (NOVOLIN-R) infusion 7.1 Units/hr (10/26/15 1508)  . norepinephrine (LEVOPHED) Adult infusion Stopped (10/25/15 2259)  . pureflow 2,000 mL/hr at 10/22/15  0104  . vasopressin (PITRESSIN) infusion - *FOR SHOCK* Stopped (10/23/15 2863)    Assessment: Pharmacy consulted to adjust medications for 70 yo female ICU patient requiring mechanical ventilation and HD. Patient previously ordered heparin drip for NSTEMI, discontinued to due to thrombocytopenia.     Plan:  1. Renal Dosing: Patient transitioned from CRRT to intermittent HD. No further renal adjustments warranted at this time.   2. Ceftriaxone (7/14 of antibiotics) Patient transitioned back to ceftriaxone 2g IV Q24hr. Plan is to complete 14 days of antibiotic therapy.   3. Electrolytes: Potassium 3.2. Patient received potassium 77mq PO x 1. All other electrolytes are within normal limits. Will recheck electrolytes with am labs.   4. Constipation: Patient has had multiple small bowel movements. Will continue patient on senna/docusate 1 tab VT BID.    Pharmacy will continue to monitor and adjust per consult.    MCurrie Paris PharmD 10/26/2015

## 2015-10-26 NOTE — Progress Notes (Signed)
[facility]  ROUNDING NOTE   Subjective:   Last HD was Saturday Now, UOP 2100 Sedation turned off this AM, followed simple commands per nursing Off pressors Vent fio2 60%   Objective:  Vital signs in last 24 hours:  Temp:  [98.1 F (36.7 C)-100.2 F (37.9 C)] 98.1 F (36.7 C) (01/30 0900) Pulse Rate:  [51-80] 62 (01/30 0900) Resp:  [20-30] 20 (01/30 0900) BP: (87-124)/(47-86) 116/69 mmHg (01/30 0900) SpO2:  [81 %-100 %] 99 % (01/30 0900) FiO2 (%):  [30 %-60 %] 60 % (01/30 0850) Weight:  [129.5 kg (285 lb 7.9 oz)] 129.5 kg (285 lb 7.9 oz) (01/30 0517)  Weight change: -1.4 kg (-3 lb 1.4 oz) Filed Weights   10/24/15 0408 10/25/15 0441 10/26/15 0517  Weight: 128.4 kg (283 lb 1.1 oz) 130.9 kg (288 lb 9.3 oz) 129.5 kg (285 lb 7.9 oz)    Intake/Output: I/O last 3 completed shifts: In: 4491.8 [I.V.:1801.8; NG/GT:2340; IV Piggyback:350] Out: 2925 [Urine:2925]   Intake/Output this shift:     Physical Exam: General: Critically ill, intubated, sedated  Head: +ETT, +OGT  Eyes: Eyes open  Neck: RIJ temp HD catheter, L IJ central line 1/25 Dr. Lucky Cowboy  Lungs:  Vent assisted FiO2 60%  Heart: Regular, no rub   Abdomen:  Soft, nontender, obese  Extremities: 3+ peripheral edema. +anasarca  Neurologic: Sedated, intubated  Skin: Feet in soft support  Access: RIJ vascath 10/20/15 Dr. Stevenson Clinch    Basic Metabolic Panel:  Recent Labs Lab 10/21/15 1755 10/22/15 0503 10/22/15 1323 10/22/15 2053 10/23/15 0449 10/24/15 0444 10/25/15 0414 10/25/15 1709 10/26/15 0400  NA 136 137  137 137 139 138 138 142  --  145  K 4.2 4.0  4.0 3.9 3.7 3.6 3.7 2.9* 3.3* 3.2*  CL 101 101  102 102 100* 100* 103 104  --  110  CO2 '23 26  24 27 26 25 26 28  '$ --  29  GLUCOSE 317* 364*  356* 314* 199* 246* 238* 231*  --  232*  BUN 34* 35*  34* 41* 49* 60* 91* 82*  --  89*  CREATININE 1.59* 1.34*  1.34* 1.72* 2.06* 2.48* 2.95* 2.36*  --  2.11*  CALCIUM 7.6* 7.7*  7.6* 7.9* 7.8* 7.7*  7.5* 7.5*  --  7.5*  MG 1.6* 1.6*  --   --  2.2 2.3  --   --  2.3  PHOS 2.3* 1.9* 1.7* 2.8 2.8 3.2  --   --   --     Liver Function Tests:  Recent Labs Lab 10/20/15 0957 10/21/15 0515  10/22/15 0503 10/22/15 1323 10/22/15 2053 10/23/15 0449 10/24/15 0444 10/26/15 0400  AST 124* 312*  --  428*  --   --   --   --  92*  ALT 63* 176*  --  276*  --   --   --   --  174*  ALKPHOS 567* 391*  --  209*  --   --   --   --  188*  BILITOT 2.5* 3.1*  --  4.8*  --   --   --   --  2.1*  PROT 7.4 5.3*  --  4.9*  --   --   --   --  5.3*  ALBUMIN 3.1* 2.1*  < > 1.8*  1.8* 1.8* 1.8* 1.7* 1.7* 1.7*  < > = values in this interval not displayed.  Recent Labs Lab 10/20/15 0957  LIPASE 11  Recent Labs Lab 10/20/15 0957  AMMONIA 34    CBC:  Recent Labs Lab 10/20/15 0957  10/22/15 1323 10/23/15 0911 10/24/15 0444 10/25/15 0414 10/26/15 0400  WBC 50.5*  < > 18.8* 23.4* 28.5* 25.4* 23.6*  NEUTROABS 47.0*  --   --   --   --   --   --   HGB 16.4*  < > 12.3 12.1 11.4* 10.8* 10.7*  HCT 49.9*  < > 36.8 36.1 33.9* 31.8* 32.2*  MCV 90.2  < > 89.3 88.2 88.5 87.5 89.3  PLT 62*  < > 14* 10* 16* 36* 59*  < > = values in this interval not displayed.  Cardiac Enzymes:  Recent Labs Lab 10/20/15 0957 10/20/15 1010 10/20/15 2242 10/21/15 1755 10/22/15 0106 10/22/15 0503  CKTOTAL  --  1562* 779*  --   --  162  TROPONINI 1.21*  --  1.08* 0.44* 0.27*  --     BNP: Invalid input(s): POCBNP  CBG:  Recent Labs Lab 10/25/15 1949 10/25/15 2125 10/25/15 2348 10/26/15 0431 10/26/15 0827  GLUCAP 161* 162* 224* 215* 4*    Microbiology: Results for orders placed or performed during the hospital encounter of 10/20/15  Urine culture     Status: None   Collection Time: 10/20/15  9:57 AM  Result Value Ref Range Status   Specimen Description URINE, RANDOM  Final   Special Requests NONE  Final   Culture >=100,000 COLONIES/mL PROTEUS MIRABILIS  Final   Report Status 10/22/2015 FINAL   Final   Organism ID, Bacteria PROTEUS MIRABILIS  Final      Susceptibility   Proteus mirabilis - MIC*    AMPICILLIN <=2 SENSITIVE Sensitive     GENTAMICIN <=1 SENSITIVE Sensitive     CEFTRIAXONE Value in next row Sensitive      SENSITIVE<=1    CIPROFLOXACIN Value in next row Sensitive      SENSITIVE<=0.25    IMIPENEM Value in next row Sensitive      SENSITIVE2    NITROFURANTOIN Value in next row Resistant      RESISTANT128    TRIMETH/SULFA Value in next row Sensitive      SENSITIVE<=20    PIP/TAZO Value in next row Sensitive      SENSITIVE<=4    AMPICILLIN/SULBACTAM Value in next row Sensitive      SENSITIVE<=2    * >=100,000 COLONIES/mL PROTEUS MIRABILIS  Culture, blood (routine x 2)     Status: None   Collection Time: 10/20/15  9:57 AM  Result Value Ref Range Status   Specimen Description BLOOD RIGHT WRIST  Final   Special Requests   Final    BOTTLES DRAWN AEROBIC AND ANAEROBIC ANA 1ML AER 4ML   Culture  Setup Time   Final    GRAM NEGATIVE RODS IN BOTH AEROBIC AND ANAEROBIC BOTTLES CRITICAL RESULT CALLED TO, READ BACK BY AND VERIFIED WITH: MATT MCBANE ON 10/21/15 AT 0005 [facility] CONFIRMED BY [place]    Culture   Final    PROTEUS MIRABILIS IN BOTH AEROBIC AND ANAEROBIC BOTTLES    Report Status 10/22/2015 FINAL  Final   Organism ID, Bacteria PROTEUS MIRABILIS  Final      Susceptibility   Proteus mirabilis - MIC*    AMPICILLIN/SULBACTAM Value in next row Sensitive      SENSITIVE<=2    PIP/TAZO Value in next row Sensitive      SENSITIVE<=4    CEFTRIAXONE Value in next row Sensitive      SENSITIVE<=1  IMIPENEM Value in next row Sensitive      SENSITIVE2    GENTAMICIN Value in next row Sensitive      SENSITIVE<=1    CIPROFLOXACIN Value in next row Sensitive      SENSITIVE<=0.25    * PROTEUS MIRABILIS  Culture, blood (routine x 2)     Status: None   Collection Time: 10/20/15  9:57 AM  Result Value Ref Range Status   Specimen Description BLOOD LEFT HAND  Final   Special  Requests   Final    BOTTLES DRAWN AEROBIC AND ANAEROBIC AER 3ML ANA 2ML   Culture  Setup Time   Final    GRAM NEGATIVE RODS IN BOTH AEROBIC AND ANAEROBIC BOTTLES CRITICAL VALUE NOTED.  VALUE IS CONSISTENT WITH PREVIOUSLY REPORTED AND CALLED VALUE.    Culture   Final    PROTEUS MIRABILIS IN BOTH AEROBIC AND ANAEROBIC BOTTLES    Report Status 10/22/2015 FINAL  Final   Organism ID, Bacteria PROTEUS MIRABILIS  Final      Susceptibility   Proteus mirabilis - MIC*    AMPICILLIN Value in next row Sensitive      SENSITIVE<=2    PIP/TAZO Value in next row Sensitive      SENSITIVE<=4    CEFTAZIDIME Value in next row Sensitive      SENSITIVE<=1    CEFTRIAXONE Value in next row Sensitive      SENSITIVE<=1    CEFEPIME Value in next row Sensitive      SENSITIVE<=1    IMIPENEM Value in next row Sensitive      SENSITIVE2    GENTAMICIN Value in next row Sensitive      SENSITIVE<=1    CIPROFLOXACIN Value in next row Sensitive      SENSITIVE<=0.25    * PROTEUS MIRABILIS  Blood Culture ID Panel (Reflexed)     Status: Abnormal   Collection Time: 10/20/15  9:57 AM  Result Value Ref Range Status   Enterococcus species NOT DETECTED NOT DETECTED Final   Listeria monocytogenes NOT DETECTED NOT DETECTED Final   Staphylococcus species NOT DETECTED NOT DETECTED Final   Staphylococcus aureus NOT DETECTED NOT DETECTED Final   Streptococcus species NOT DETECTED NOT DETECTED Final   Streptococcus agalactiae NOT DETECTED NOT DETECTED Final   Streptococcus pneumoniae NOT DETECTED NOT DETECTED Final   Streptococcus pyogenes NOT DETECTED NOT DETECTED Final   Acinetobacter baumannii NOT DETECTED NOT DETECTED Final   Enterobacteriaceae species DETECTED (A) NOT DETECTED Final    Comment: CRITICAL RESULT CALLED TO, READ BACK BY AND VERIFIED WITH: MATT MCBANE ON 10/21/15 AT 0005 [place]    Enterobacter cloacae complex NOT DETECTED NOT DETECTED Final   Escherichia coli NOT DETECTED NOT DETECTED Final   Klebsiella  oxytoca NOT DETECTED NOT DETECTED Final   Klebsiella pneumoniae NOT DETECTED NOT DETECTED Final   Proteus species (A) NOT DETECTED Final    CRITICAL RESULT CALLED TO, READ BACK BY AND VERIFIED WITH:    Comment: MATT MCBANE ON 10/21/15 AT 0005 [place]   Serratia marcescens NOT DETECTED NOT DETECTED Final   Haemophilus influenzae NOT DETECTED NOT DETECTED Final   Neisseria meningitidis NOT DETECTED NOT DETECTED Final   Pseudomonas aeruginosa NOT DETECTED NOT DETECTED Final   Candida albicans NOT DETECTED NOT DETECTED Final   Candida glabrata NOT DETECTED NOT DETECTED Final   Candida krusei NOT DETECTED NOT DETECTED Final   Candida parapsilosis NOT DETECTED NOT DETECTED Final   Candida tropicalis NOT DETECTED NOT DETECTED Final  Carbapenem resistance NOT DETECTED NOT DETECTED Final   Methicillin resistance NOT DETECTED NOT DETECTED Final   Vancomycin resistance NOT DETECTED NOT DETECTED Final  MRSA PCR Screening     Status: None   Collection Time: 10/20/15  7:41 PM  Result Value Ref Range Status   MRSA by PCR NEGATIVE NEGATIVE Final    Comment:        The GeneXpert MRSA Assay (FDA approved for NASAL specimens only), is one component of a comprehensive MRSA colonization surveillance program. It is not intended to diagnose MRSA infection nor to guide or monitor treatment for MRSA infections.   CULTURE, BLOOD (ROUTINE X 2) w Reflex to PCR ID Panel     Status: None (Preliminary result)   Collection Time: 10/22/15 11:39 AM  Result Value Ref Range Status   Specimen Description BLOOD LEFT HAND  Final   Special Requests BOTTLES DRAWN AEROBIC AND ANAEROBIC 1CC  Final   Culture NO GROWTH 3 DAYS  Final   Report Status PENDING  Incomplete  Culture, expectorated sputum-assessment     Status: None   Collection Time: 10/22/15 12:00 PM  Result Value Ref Range Status   Specimen Description SPUTUM  Final   Special Requests Normal  Final   Sputum evaluation THIS SPECIMEN IS ACCEPTABLE FOR SPUTUM  CULTURE  Final   Report Status 10/22/2015 FINAL  Final  Culture, respiratory (NON-Expectorated)     Status: None   Collection Time: 10/22/15 12:00 PM  Result Value Ref Range Status   Specimen Description SPUTUM  Final   Special Requests Normal Reflexed from [id number]  Final   Gram Stain   Final    FAIR SPECIMEN - 70-80% WBCS FEW WBC SEEN RARE YEAST RARE GRAM NEGATIVE RODS    Culture LIGHT GROWTH ENTEROBACTER AEROGENES  Final   Report Status 10/24/2015 FINAL  Final   Organism ID, Bacteria ENTEROBACTER AEROGENES  Final      Susceptibility   Enterobacter aerogenes - MIC*    CEFAZOLIN >=64 RESISTANT Resistant     CEFEPIME <=1 SENSITIVE Sensitive     CEFTAZIDIME <=1 SENSITIVE Sensitive     CEFTRIAXONE <=1 SENSITIVE Sensitive     CIPROFLOXACIN <=0.25 SENSITIVE Sensitive     GENTAMICIN <=1 SENSITIVE Sensitive     IMIPENEM 2 SENSITIVE Sensitive     TRIMETH/SULFA <=20 SENSITIVE Sensitive     PIP/TAZO <=4 SENSITIVE Sensitive     * LIGHT GROWTH ENTEROBACTER AEROGENES  Urine culture     Status: None   Collection Time: 10/22/15 12:45 PM  Result Value Ref Range Status   Specimen Description URINE, RANDOM  Final   Special Requests NONE  Final   Culture NO GROWTH 2 DAYS  Final   Report Status 10/24/2015 FINAL  Final    Coagulation Studies: No results for input(s): LABPROT, INR in the last 72 hours.  Urinalysis: No results for input(s): COLORURINE, LABSPEC, PHURINE, GLUCOSEU, HGBUR, BILIRUBINUR, KETONESUR, PROTEINUR, UROBILINOGEN, NITRITE, LEUKOCYTESUR in the last 72 hours.  Invalid input(s): APPERANCEUR    Imaging: Dg Chest 1 View  10/26/2015  CLINICAL DATA:  Unresponsive.  Intubation . EXAM: CHEST 1 VIEW COMPARISON:  10/23/2015. FINDINGS: Endotracheal tube, NG tube, right IJ line, left IJ line in stable position. Cardiomegaly with bilateral pulmonary alveolar infiltrates consistent congestive heart failure bilateral pulmonary edema. Low lung volumes with basilar atelectasis. Small  bilateral pleural effusions. No pneumothorax. IMPRESSION: 1. Lines and tubes in stable position. 2. Congestive heart failure with bilateral pulmonary edema and small pleural effusions. Slight  interim progression from prior exam. 3. Lung volumes with basilar atelectasis. Electronically Signed   By: Marcello Moores  Register   On: 10/26/2015 07:24     Medications:   . dexmedetomidine 0.8 mcg/kg/hr (10/26/15 0659)  . feeding supplement (VITAL AF 1.2 CAL) 1,000 mL (10/26/15 0600)  . fentaNYL infusion INTRAVENOUS 200 mcg/hr (10/26/15 0907)  . insulin (NOVOLIN-R) infusion    . norepinephrine (LEVOPHED) Adult infusion Stopped (10/25/15 2259)  . pureflow 2,000 mL/hr at 10/22/15 0104  . vasopressin (PITRESSIN) infusion - *FOR SHOCK* Stopped (10/23/15 0950)   . amiodarone  400 mg Oral BID  . antiseptic oral rinse  7 mL Mouth Rinse 10 times per day  . chlorhexidine gluconate  15 mL Mouth Rinse BID  . feeding supplement (PRO-STAT SUGAR FREE 64)  30 mL Per Tube TID  . free water  100 mL Per Tube 3 times per day  . ipratropium-albuterol  3 mL Nebulization Q6H  . meropenem (MERREM) IV  500 mg Intravenous Q24H  . pantoprazole sodium  40 mg Per Tube Q24H  . senna-docusate  2 tablet Oral BID   acetaminophen **OR** acetaminophen, midazolam  Assessment/ Plan:  April [name] is a 70 y.o. white female with DM (A1c 7.7%),  who was admitted to [facility] on 10/20/2015  1. Acute renal failure with metabolic acidosis:  - Unknown baseline creatinine. Admit Cr 3.01 Required CRRT from 1/24 to 1/27. Then intermittent hemodialysis 1/28.  - Acute renal failure from sepsis leading to ATN. Serologic testing negative 10/21/15 - Now nonoliguric with good urine output. >2100 - Electrolytes and Volume status are acceptable No acute indication for Dialysis at present   2. Right Hydronephrosis - seen on CT scan - will need follow up study and urology evaluation  3. Hypokalemia: post ATN diuresis.  - potassium being replaced.  prn  4. 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 Platelets now trending back up.  - argatroban   LOS: 6 Kiyaan Haq 1/30/20179:47 AM

## 2015-10-26 NOTE — Progress Notes (Signed)
Nutrition Follow-up    INTERVENTION:   EN: recommend continuing current TF regimen, continue to assess  NUTRITION DIAGNOSIS:   Inadequate oral intake related to acute illness as evidenced by NPO status.  GOAL:   Provide needs based on ASPEN/SCCM guidelines  MONITOR:    (Energy Intake, Pulmonary, Digestive System, Electrolyte/Renal Profile, Glucose Profile)  REASON FOR ASSESSMENT:   Ventilator    ASSESSMENT:    Pt remains on vent, off vasopressors, last HD on Saturday, no plan for HD today  Diet Order:  Diet NPO time specified   EN: tolerating Vital 1.2 at rate of 50 ml/hr, Prostat TID  Digestive System:  No signs of TF intolerance  Skin:  Reviewed, no issues  Last BM:  1/30   Urine Volume: UOP 2100 mL  Electrolyte and Renal Profile:  Recent Labs Lab 10/23/15 0449 10/24/15 0444 10/25/15 0414 10/25/15 1709 10/26/15 0400  BUN 60* 91* 82*  --  89*  CREATININE 2.48* 2.95* 2.36*  --  2.11*  NA 138 138 142  --  145  K 3.6 3.7 2.9* 3.3* 3.2*  MG 2.2 2.3  --   --  2.3  PHOS 2.8 3.2  --   --  4.5   Glucose Profile:  Recent Labs  10/26/15 0947 10/26/15 1105 10/26/15 1225  GLUCAP 254* 273* 250*   Meds: insulin drip  Height:   Ht Readings from Last 1 Encounters:  10/20/15 '5\' 7"'$  (1.702 m)    Weight:   Wt Readings from Last 1 Encounters:  10/26/15 285 lb 7.9 oz (129.5 kg)    BMI:  Body mass index is 44.7 kg/(m^2).  Estimated Nutritional Needs:   Kcal:  7619-5093 kcals (11-14 kcals/kg) using wt of 122.9 kg  Protein:  122-153 g (2.0-2.5 g/kg) using IBW 61 kg  Fluid:  1525-1830 mL (25-30 ml/kg)   EDUCATION NEEDS:   No education needs identified at this time  [place], RD, LDN [phone] Pager  [phone] Weekend/On-Call Pager

## 2015-10-27 ENCOUNTER — Inpatient Hospital Stay: Payer: Medicare HMO

## 2015-10-27 ENCOUNTER — Encounter: Payer: Self-pay | Admitting: Radiology

## 2015-10-27 LAB — CULTURE, BLOOD (ROUTINE X 2): CULTURE: NO GROWTH

## 2015-10-27 LAB — BASIC METABOLIC PANEL
Anion gap: 8 (ref 5–15)
BUN: 82 mg/dL — AB (ref 6–20)
CHLORIDE: 110 mmol/L (ref 101–111)
CO2: 27 mmol/L (ref 22–32)
CREATININE: 1.8 mg/dL — AB (ref 0.44–1.00)
Calcium: 7.3 mg/dL — ABNORMAL LOW (ref 8.9–10.3)
GFR calc Af Amer: 32 mL/min — ABNORMAL LOW (ref >60)
GFR calc non Af Amer: 28 mL/min — ABNORMAL LOW (ref >60)
GLUCOSE: 197 mg/dL — AB (ref 65–99)
Potassium: 3.3 mmol/L — ABNORMAL LOW (ref 3.5–5.1)
Sodium: 145 mmol/L (ref 135–145)

## 2015-10-27 LAB — CBC
HEMATOCRIT: 30.4 % — AB (ref 35.0–47.0)
HEMOGLOBIN: 10 g/dL — AB (ref 12.0–16.0)
MCH: 29.9 pg (ref 26.0–34.0)
MCHC: 32.9 g/dL (ref 32.0–36.0)
MCV: 91 fL (ref 80.0–100.0)
Platelets: 82 10*3/uL — ABNORMAL LOW (ref 150–440)
RBC: 3.34 MIL/uL — AB (ref 3.80–5.20)
RDW: 14.8 % — ABNORMAL HIGH (ref 11.5–14.5)
WBC: 27.6 10*3/uL — ABNORMAL HIGH (ref 3.6–11.0)

## 2015-10-27 LAB — GLUCOSE, CAPILLARY
GLUCOSE-CAPILLARY: 122 mg/dL — AB (ref 65–99)
GLUCOSE-CAPILLARY: 129 mg/dL — AB (ref 65–99)
GLUCOSE-CAPILLARY: 134 mg/dL — AB (ref 65–99)
Glucose-Capillary: 147 mg/dL — ABNORMAL HIGH (ref 65–99)
Glucose-Capillary: 153 mg/dL — ABNORMAL HIGH (ref 65–99)
Glucose-Capillary: 183 mg/dL — ABNORMAL HIGH (ref 65–99)
Glucose-Capillary: 212 mg/dL — ABNORMAL HIGH (ref 65–99)
Glucose-Capillary: 171 mg/dL — ABNORMAL HIGH (ref 65–99)
Glucose-Capillary: 207 mg/dL — ABNORMAL HIGH (ref 65–99)

## 2015-10-27 LAB — SEROTONIN RELEASE ASSAY (SRA)
SRA, HIGH DOSE HEPARIN: 2 % (ref 0–20)
SRA, LOW DOSE HEPARIN: 1 % (ref 0–20)

## 2015-10-27 LAB — HEPATITIS B SURFACE ANTIGEN: Hepatitis B Surface Ag: NEGATIVE

## 2015-10-27 LAB — HEPATITIS B CORE ANTIBODY, TOTAL: HEP B C TOTAL AB: NEGATIVE

## 2015-10-27 LAB — HEPATITIS B SURFACE ANTIBODY,QUALITATIVE: HEP B S AB: NONREACTIVE

## 2015-10-27 MED ORDER — INSULIN DETEMIR 100 UNIT/ML ~~LOC~~ SOLN
13.0000 [IU] | Freq: Every day | SUBCUTANEOUS | Status: DC
  Administered 2015-10-27 – 2015-10-29 (×4): 13 [IU] via SUBCUTANEOUS
  Filled 2015-10-27 (×5): qty 0.13

## 2015-10-27 MED ORDER — POTASSIUM CHLORIDE 20 MEQ PO PACK
40.0000 meq | PACK | Freq: Every day | ORAL | Status: DC
  Administered 2015-10-27 – 2015-10-29 (×3): 40 meq via ORAL
  Filled 2015-10-27 (×3): qty 2

## 2015-10-27 MED ORDER — IOHEXOL 240 MG/ML SOLN: 50.0000 mL | Freq: Once | INTRAMUSCULAR | Status: DC | PRN

## 2015-10-27 MED ORDER — INSULIN ASPART 100 UNIT/ML ~~LOC~~ SOLN
0.0000 [IU] | SUBCUTANEOUS | Status: DC
  Administered 2015-10-27: 3 [IU] via SUBCUTANEOUS
  Administered 2015-10-27: 2 [IU] via SUBCUTANEOUS
  Administered 2015-10-27: 3 [IU] via SUBCUTANEOUS
  Administered 2015-10-27 – 2015-10-28 (×5): 5 [IU] via SUBCUTANEOUS
  Administered 2015-10-28 – 2015-10-29 (×9): 3 [IU] via SUBCUTANEOUS
  Administered 2015-10-30: 2 [IU] via SUBCUTANEOUS
  Administered 2015-10-30 – 2015-10-31 (×7): 3 [IU] via SUBCUTANEOUS
  Administered 2015-10-31: 5 [IU] via SUBCUTANEOUS
  Administered 2015-10-31: 3 [IU] via SUBCUTANEOUS
  Filled 2015-10-27 (×4): qty 3
  Filled 2015-10-27: qty 2
  Filled 2015-10-27 (×5): qty 3
  Filled 2015-10-27: qty 5
  Filled 2015-10-27: qty 3
  Filled 2015-10-27: qty 4
  Filled 2015-10-27 (×3): qty 5
  Filled 2015-10-27 (×2): qty 3
  Filled 2015-10-27: qty 5
  Filled 2015-10-27: qty 2
  Filled 2015-10-27 (×7): qty 3

## 2015-10-27 MED ORDER — INSULIN ASPART 100 UNIT/ML ~~LOC~~ SOLN: 5.0000 [IU] | SUBCUTANEOUS | Status: DC

## 2015-10-27 NOTE — Progress Notes (Signed)
[facility] [place] NAME: April [name]    MR#:  [id number]  DATE OF BIRTH:  1946-07-17  SUBJECTIVE:   Remains Critically ill. Plt count improving. Urine output is improving.  Follows some simple commands once off sedation.  Going for CT abd/pelvis, head today.    REVIEW OF SYSTEMS:   Review of Systems  Unable to perform ROS: intubated   Tolerating Diet: TF Tolerating PT: intubated  DRUG ALLERGIES:   Allergies  Allergen Reactions  . Prednisone Anaphylaxis    VITALS:  Blood pressure 121/53, pulse 84, temperature 99.3 F (37.4 C), temperature source Core (Comment), resp. rate 25, height '5\' 7"'$  (1.702 m), weight 129.6 kg (285 lb 11.5 oz), SpO2 99 %.  PHYSICAL EXAMINATION:   Physical Exam  GENERAL:  70 y.o.-year-old patient lying in the bed critically ill, intubated and on the vent in no acute distress. EYES: Pupils equal, round, reactive to light. (-) scleral icterus.  HEENT: Head atraumatic, normocephalic. Oropharynx and nasopharynx clear. intubated and on the vent NECK:  Supple, no jugular venous distention. No thyroid enlargement, no tenderness.  LUNGS: distant breath sounds bilaterally, no wheezing, rales, rhonchi. No use of accessory muscles of respiration.  CARDIOVASCULAR: S1, S2 normal. No murmurs, rubs, or gallops.  ABDOMEN: Soft, nontender, nondistended. Bowel sounds present. No organomegaly or mass. + foley with yellow urine draining.  EXTREMITIES: No cyanosis, clubbing b/l.  + 2 pulses b/l.  NEUROLOGIC: Sedated and on the ventilator but does not follow any simple commands. + Tremor/twitching noted at times.  PSYCHIATRIC: Difficult to assess given the fact that she is sedated and intubated.  SKIN: No rashes, lesions, masses.  LABORATORY PANEL:  CBC  Recent Labs Lab 10/27/15 0433  WBC 27.6*  HGB 10.0*  HCT 30.4*  PLT 82*    Chemistries   Recent Labs Lab 10/26/15 0400 10/27/15 0433  NA 145 145  K 3.2*  3.3*  CL 110 110  CO2 29 27  GLUCOSE 232* 197*  BUN 89* 82*  CREATININE 2.11* 1.80*  CALCIUM 7.5* 7.3*  MG 2.3  --   AST 92*  --   ALT 174*  --   ALKPHOS 188*  --   BILITOT 2.1*  --    Cardiac Enzymes  Recent Labs Lab 10/22/15 0106  TROPONINI 0.27*   RADIOLOGY:  Dg Chest 1 View  10/26/2015  CLINICAL DATA:  Unresponsive.  Intubation . EXAM: CHEST 1 VIEW COMPARISON:  10/23/2015. FINDINGS: Endotracheal tube, NG tube, right IJ line, left IJ line in stable position. Cardiomegaly with bilateral pulmonary alveolar infiltrates consistent congestive heart failure bilateral pulmonary edema. Low lung volumes with basilar atelectasis. Small bilateral pleural effusions. No pneumothorax. IMPRESSION: 1. Lines and tubes in stable position. 2. Congestive heart failure with bilateral pulmonary edema and small pleural effusions. Slight interim progression from prior exam. 3. Lung volumes with basilar atelectasis. Electronically Signed   By: Marcello Moores  Register   On: 10/26/2015 07:24   ASSESSMENT AND PLAN:   70 year old female presented to the emergency room after she was found lying on the floor with minimal responsiveness.  1. Severe Septic shock, hypovolemic shock: due to proteus bacteremia, from UTI - Patient's currently intubated on ventilator Day 8 - off vasopressors now.   - switched from IV Meropenem to IV Rocephin and and will cont.  - off stress dose steroids now.  Improving. -  blood and urine culture growing Proteus Mirabilis.    2. Acute renal failure: -  is due to ATN in the setting of sepsis and rhabdomyolysis.  - was on CRRT but it was stopped due to thrombocytopenia.  Had short course of HD 1/28 - urine output is improving and > 2.5 L in the past 24 hrs.  - no acute indication for HD and cont. Care as per Nephro.  - pt. Was noted to have RIGHT hydronephrosis question UPJ obstruction on CT Scan abd/pelvis on admission. Will get Repeat Abd/pelvis today to follow up.   3. Severe  leukocytosis: This is due to usepsis and also likely due to dehydration/anuria. - stable and will monitor. Afebrile.    4. Atrial fibrillation with RVR - rate controlled now.  Off amio gtt and cont. Oral amio.  - Echo showing normal LV function w/ EF of 55%.    5. DM-2 , new onset - HgA1c of 7.7 - No further episodes of hypoglycemia. Continue insulin every 4 hours and also sliding scale insulin for now. BS stable.   6. Elevated transaminases and jaundice - due to Shock liver.  LFT's improving and will cont. To monitor.   7. Acute rhabdomyolysis secondary to patient being found down on the floor:  - CPK's have trended down. Resolved.   8. Thrombocytopenia - came in with 62---46--23--12--14--> 16-->36-->59-->82 today.  - likely consumption of plts given severe sepsis ,?DIC, ? Related to CRRT which has been stopped.  - no active bleeding and cont. To follow Plt count.   9. Hypokalemia -  Supplemented and improving.   10. Tremor/Twitching - etiology unclear.  Pt. Was down for prolonged period of time prior to admission but did not have cardiac arrest.  CT head on admission (-) but will repeat today and follow up. May need Neuro consult.   She remains critically ill with multiorgan failure.  CODE STATUS: full  DVT Prophylaxis: SCD/TED  TOTAL critical TIME TAKING CARE OF THIS PATIENT: 30 minutes.   Note: This dictation was prepared with Dragon dictation along with smaller phrase technology. Any transcriptional errors that result from this process are unintentional.  Henreitta Leber M.D on 10/27/2015 at 3:48 PM  Between 7am to 6pm - Pager - [phone]  After 6pm go to [email or web address] - password [email or web address] [place] [facility]  Office  [phone]  CC: Primary care physician; Albina Billet, MD

## 2015-10-27 NOTE — Progress Notes (Signed)
Inpatient Diabetes Program Recommendations  AACE/ADA: New Consensus Statement on Inpatient Glycemic Control (2015)  Target Ranges:  Prepandial:   less than 140 mg/dL      Peak postprandial:   less than 180 mg/dL (1-2 hours)      Critically ill patients:  140 - 180 mg/dL  Results for CHESLEY, April [name] (MRN [id number]) as of 10/27/2015 09:36  Ref. Range 10/26/2015 20:28 10/26/2015 21:15 10/26/2015 22:24 10/26/2015 23:20 10/27/2015 00:08 10/27/2015 00:37 10/27/2015 01:35 10/27/2015 02:22 10/27/2015 04:11 10/27/2015 08:03  Glucose-Capillary Latest Ref Range: 65-99 mg/dL 139 (H) 142 (H) 143 (H) 128 (H) 122 (H) 153 (H) 147 (H) 129 (H) 134 (H) 183 (H)   Review of Glycemic Control  Current orders for Inpatient glycemic control: Levemir 13 units QHS, Novolog 0-15 units Q4H  Inpatient Diabetes Program Recommendations: Insulin - Meal Coverage: Patient was ordered Novolog 5 units Q4H for tube feeding coverage. However, it was discontinued yesterday when patient was started on IV insulin drip. Please consider re-ordering Novolog 5 units Q4H for tube feeding coverage (in addition to Novolog correction scale).  Thanks, Barnie Alderman, RN, MSN, CDE Diabetes Coordinator Inpatient Diabetes Program [phone] (Team Pager from [place] to [place]) [phone] ([place] office) [phone] [facility] office) [phone] [facility] office)

## 2015-10-27 NOTE — Progress Notes (Signed)
[place] NOTE  Pharmacy Consult for HD medication management, Ceftriaxone Dosing (day 8/14), Electrolyte Dosing, Constipation Prevention Indication: HD/ Proteus Bacteremia     Allergies  Allergen Reactions  . Prednisone Anaphylaxis    Patient Measurements: Height: '5\' 7"'$  (170.2 cm) Weight: 285 lb 11.5 oz (129.6 kg) IBW/kg (Calculated) : 61.6   Vital Signs: Temp: 99.3 F (37.4 C) (01/31 1200) Temp Source: Core (Comment) (01/31 0400) BP: 121/53 mmHg (01/31 1200) Pulse Rate: 84 (01/31 1200) Intake/Output from previous day: 01/30 0701 - 01/31 0700 In: 2708 [I.V.:838; NG/GT:1820; IV Piggyback:50] Out: 2570 [Urine:2570] Intake/Output from this shift: Total I/O In: 367.8 [I.V.:117.8; NG/GT:250] Out: 1400 [Urine:1400] Vent settings for last 24 hours: Vent Mode:  [-] PRVC FiO2 (%):  [50 %-60 %] 50 % Set Rate:  [25 bmp] 25 bmp Vt Set:  [550 mL] 550 mL PEEP:  [0 cmH20] 0 cmH20 Plateau Pressure:  [24 cmH20] 24 cmH20  Labs:  Recent Labs  10/25/15 0414 10/26/15 0400 10/27/15 0433  WBC 25.4* 23.6* 27.6*  HGB 10.8* 10.7* 10.0*  HCT 31.8* 32.2* 30.4*  PLT 36* 59* 82*  CREATININE 2.36* 2.11* 1.80*  MG  --  2.3  --   PHOS  --  4.5  --   ALBUMIN  --  1.7*  --   PROT  --  5.3*  --   AST  --  92*  --   ALT  --  174*  --   ALKPHOS  --  188*  --   BILITOT  --  2.1*  --    Estimated Creatinine Clearance: 41.4 mL/min (by C-G formula based on Cr of 1.8).   Recent Labs  10/27/15 0411 10/27/15 0803 10/27/15 1232  GLUCAP 134* 183* 212*    Microbiology: Recent Results (from the past 720 hour(s))  Urine culture     Status: None   Collection Time: 10/20/15  9:57 AM  Result Value Ref Range Status   Specimen Description URINE, RANDOM  Final   Special Requests NONE  Final   Culture >=100,000 COLONIES/mL PROTEUS MIRABILIS  Final   Report Status 10/22/2015 FINAL  Final   Organism ID, Bacteria PROTEUS MIRABILIS  Final      Susceptibility   Proteus  mirabilis - MIC*    AMPICILLIN <=2 SENSITIVE Sensitive     GENTAMICIN <=1 SENSITIVE Sensitive     CEFTRIAXONE Value in next row Sensitive      SENSITIVE<=1    CIPROFLOXACIN Value in next row Sensitive      SENSITIVE<=0.25    IMIPENEM Value in next row Sensitive      SENSITIVE2    NITROFURANTOIN Value in next row Resistant      RESISTANT128    TRIMETH/SULFA Value in next row Sensitive      SENSITIVE<=20    PIP/TAZO Value in next row Sensitive      SENSITIVE<=4    AMPICILLIN/SULBACTAM Value in next row Sensitive      SENSITIVE<=2    * >=100,000 COLONIES/mL PROTEUS MIRABILIS  Culture, blood (routine x 2)     Status: None   Collection Time: 10/20/15  9:57 AM  Result Value Ref Range Status   Specimen Description BLOOD RIGHT WRIST  Final   Special Requests   Final    BOTTLES DRAWN AEROBIC AND ANAEROBIC ANA 1ML AER 4ML   Culture  Setup Time   Final    GRAM NEGATIVE RODS IN BOTH AEROBIC AND ANAEROBIC BOTTLES CRITICAL RESULT CALLED TO, READ BACK  BY AND VERIFIED WITH: MATT MCBANE ON 10/21/15 AT 0005 [place]    Culture   Final    PROTEUS MIRABILIS IN BOTH AEROBIC AND ANAEROBIC BOTTLES    Report Status 10/22/2015 FINAL  Final   Organism ID, Bacteria PROTEUS MIRABILIS  Final      Susceptibility   Proteus mirabilis - MIC*    AMPICILLIN/SULBACTAM Value in next row Sensitive      SENSITIVE<=2    PIP/TAZO Value in next row Sensitive      SENSITIVE<=4    CEFTRIAXONE Value in next row Sensitive      SENSITIVE<=1    IMIPENEM Value in next row Sensitive      SENSITIVE2    GENTAMICIN Value in next row Sensitive      SENSITIVE<=1    CIPROFLOXACIN Value in next row Sensitive      SENSITIVE<=0.25    * PROTEUS MIRABILIS  Culture, blood (routine x 2)     Status: None   Collection Time: 10/20/15  9:57 AM  Result Value Ref Range Status   Specimen Description BLOOD LEFT HAND  Final   Special Requests   Final    BOTTLES DRAWN AEROBIC AND ANAEROBIC AER 3ML ANA 2ML   Culture   Setup Time   Final    GRAM NEGATIVE RODS IN BOTH AEROBIC AND ANAEROBIC BOTTLES CRITICAL VALUE NOTED.  VALUE IS CONSISTENT WITH PREVIOUSLY REPORTED AND CALLED VALUE.    Culture   Final    PROTEUS MIRABILIS IN BOTH AEROBIC AND ANAEROBIC BOTTLES    Report Status 10/22/2015 FINAL  Final   Organism ID, Bacteria PROTEUS MIRABILIS  Final      Susceptibility   Proteus mirabilis - MIC*    AMPICILLIN Value in next row Sensitive      SENSITIVE<=2    PIP/TAZO Value in next row Sensitive      SENSITIVE<=4    CEFTAZIDIME Value in next row Sensitive      SENSITIVE<=1    CEFTRIAXONE Value in next row Sensitive      SENSITIVE<=1    CEFEPIME Value in next row Sensitive      SENSITIVE<=1    IMIPENEM Value in next row Sensitive      SENSITIVE2    GENTAMICIN Value in next row Sensitive      SENSITIVE<=1    CIPROFLOXACIN Value in next row Sensitive      SENSITIVE<=0.25    * PROTEUS MIRABILIS  Blood Culture ID Panel (Reflexed)     Status: Abnormal   Collection Time: 10/20/15  9:57 AM  Result Value Ref Range Status   Enterococcus species NOT DETECTED NOT DETECTED Final   Listeria monocytogenes NOT DETECTED NOT DETECTED Final   Staphylococcus species NOT DETECTED NOT DETECTED Final   Staphylococcus aureus NOT DETECTED NOT DETECTED Final   Streptococcus species NOT DETECTED NOT DETECTED Final   Streptococcus agalactiae NOT DETECTED NOT DETECTED Final   Streptococcus pneumoniae NOT DETECTED NOT DETECTED Final   Streptococcus pyogenes NOT DETECTED NOT DETECTED Final   Acinetobacter baumannii NOT DETECTED NOT DETECTED Final   Enterobacteriaceae species DETECTED (A) NOT DETECTED Final    Comment: CRITICAL RESULT CALLED TO, READ BACK BY AND VERIFIED WITH: MATT MCBANE ON 10/21/15 AT 0005 [place]    Enterobacter cloacae complex NOT DETECTED NOT DETECTED Final   Escherichia coli NOT DETECTED NOT DETECTED Final   Klebsiella oxytoca NOT DETECTED NOT DETECTED Final   Klebsiella pneumoniae NOT DETECTED NOT  DETECTED Final   Proteus  species (A) NOT DETECTED Final    CRITICAL RESULT CALLED TO, READ BACK BY AND VERIFIED WITH:    Comment: MATT MCBANE ON 10/21/15 AT 0005 [place]   Serratia marcescens NOT DETECTED NOT DETECTED Final   Haemophilus influenzae NOT DETECTED NOT DETECTED Final   Neisseria meningitidis NOT DETECTED NOT DETECTED Final   Pseudomonas aeruginosa NOT DETECTED NOT DETECTED Final   Candida albicans NOT DETECTED NOT DETECTED Final   Candida glabrata NOT DETECTED NOT DETECTED Final   Candida krusei NOT DETECTED NOT DETECTED Final   Candida parapsilosis NOT DETECTED NOT DETECTED Final   Candida tropicalis NOT DETECTED NOT DETECTED Final   Carbapenem resistance NOT DETECTED NOT DETECTED Final   Methicillin resistance NOT DETECTED NOT DETECTED Final   Vancomycin resistance NOT DETECTED NOT DETECTED Final  MRSA PCR Screening     Status: None   Collection Time: 10/20/15  7:41 PM  Result Value Ref Range Status   MRSA by PCR NEGATIVE NEGATIVE Final    Comment:        The GeneXpert MRSA Assay (FDA approved for NASAL specimens only), is one component of a comprehensive MRSA colonization surveillance program. It is not intended to diagnose MRSA infection nor to guide or monitor treatment for MRSA infections.   CULTURE, BLOOD (ROUTINE X 2) w Reflex to PCR ID Panel     Status: None   Collection Time: 10/22/15 11:39 AM  Result Value Ref Range Status   Specimen Description BLOOD LEFT HAND  Final   Special Requests BOTTLES DRAWN AEROBIC AND ANAEROBIC 1CC  Final   Culture NO GROWTH 5 DAYS  Final   Report Status 10/27/2015 FINAL  Final  Culture, expectorated sputum-assessment     Status: None   Collection Time: 10/22/15 12:00 PM  Result Value Ref Range Status   Specimen Description SPUTUM  Final   Special Requests Normal  Final   Sputum evaluation THIS SPECIMEN IS ACCEPTABLE FOR SPUTUM CULTURE  Final   Report Status 10/22/2015 FINAL  Final  Culture, respiratory (NON-Expectorated)      Status: None   Collection Time: 10/22/15 12:00 PM  Result Value Ref Range Status   Specimen Description SPUTUM  Final   Special Requests Normal Reflexed from [id number]  Final   Gram Stain   Final    FAIR SPECIMEN - 70-80% WBCS FEW WBC SEEN RARE YEAST RARE GRAM NEGATIVE RODS    Culture LIGHT GROWTH ENTEROBACTER AEROGENES  Final   Report Status 10/24/2015 FINAL  Final   Organism ID, Bacteria ENTEROBACTER AEROGENES  Final      Susceptibility   Enterobacter aerogenes - MIC*    CEFAZOLIN >=64 RESISTANT Resistant     CEFEPIME <=1 SENSITIVE Sensitive     CEFTAZIDIME <=1 SENSITIVE Sensitive     CEFTRIAXONE <=1 SENSITIVE Sensitive     CIPROFLOXACIN <=0.25 SENSITIVE Sensitive     GENTAMICIN <=1 SENSITIVE Sensitive     IMIPENEM 2 SENSITIVE Sensitive     TRIMETH/SULFA <=20 SENSITIVE Sensitive     PIP/TAZO <=4 SENSITIVE Sensitive     * LIGHT GROWTH ENTEROBACTER AEROGENES  Urine culture     Status: None   Collection Time: 10/22/15 12:45 PM  Result Value Ref Range Status   Specimen Description URINE, RANDOM  Final   Special Requests NONE  Final   Culture NO GROWTH 2 DAYS  Final   Report Status 10/24/2015 FINAL  Final    Medications:  Scheduled:  . amiodarone  400 mg Oral BID  . antiseptic  oral rinse  7 mL Mouth Rinse 10 times per day  . cefTRIAXone (ROCEPHIN)  IV  2 g Intravenous Q24H  . chlorhexidine gluconate  15 mL Mouth Rinse BID  . feeding supplement (PRO-STAT SUGAR FREE 64)  30 mL Per Tube TID  . free water  100 mL Per Tube 3 times per day  . insulin aspart  0-15 Units Subcutaneous 6 times per day  . insulin detemir  13 Units Subcutaneous QHS  . ipratropium-albuterol  3 mL Nebulization Q6H  . oxyCODONE  5 mg Oral Q6H  . pantoprazole sodium  40 mg Per Tube Q24H  . potassium chloride  40 mEq Oral Daily  . senna-docusate  1 tablet Oral BID   Infusions:  . feeding supplement (VITAL AF 1.2 CAL) 1,000 mL (10/27/15 0700)  . fentaNYL infusion INTRAVENOUS 275 mcg/hr (10/27/15 0900)   . norepinephrine (LEVOPHED) Adult infusion Stopped (10/25/15 2259)  . pureflow 2,000 mL/hr at 10/22/15 0104  . vasopressin (PITRESSIN) infusion - *FOR SHOCK* Stopped (10/23/15 1610)    Assessment: Pharmacy consulted to adjust medications for 70 yo female ICU patient requiring mechanical ventilation and HD. Patient previously ordered heparin drip for NSTEMI, discontinued to due to thrombocytopenia.     Plan:  1. Renal Dosing: Patient transitioned from CRRT to intermittent HD. No further renal adjustments warranted at this time.   2. Ceftriaxone (8/14 of antibiotics) Patient transitioned back to ceftriaxone 2g IV Q24hr. Plan is to complete 14 days of antibiotic therapy.   3. Electrolytes: Potassium 3.3. Potassium 40 meq oral daily ordered. All other electrolytes are within normal limits. Will recheck electrolytes with am labs.   4. Constipation: Patient has had multiple small bowel movements. Will continue patient on senna/docusate 1 tab VT BID.    Pharmacy will continue to monitor and adjust per consult.    Ulice Dash, PharmD Clinical Pharmacist   10/27/2015

## 2015-10-27 NOTE — Progress Notes (Signed)
Went with patient's nurse to CT with patient on transport vent.

## 2015-10-27 NOTE — Progress Notes (Signed)
[facility]  ROUNDING NOTE   Subjective:   Last HD was Saturday Now, UOP > 2500 Vent assisted fio2 60%   Objective:  Vital signs in last 24 hours:  Temp:  [97.3 F (36.3 C)-99.5 F (37.5 C)] 98.8 F (37.1 C) (01/31 0800) Pulse Rate:  [58-110] 92 (01/31 0800) Resp:  [22-26] 25 (01/31 0800) BP: (96-154)/(42-70) 141/63 mmHg (01/31 0800) SpO2:  [92 %-100 %] 100 % (01/31 0800) FiO2 (%):  [60 %] 60 % (01/31 0757) Weight:  [129.6 kg (285 lb 11.5 oz)] 129.6 kg (285 lb 11.5 oz) (01/31 0602)  Weight change: 0.1 kg (3.5 oz) Filed Weights   10/25/15 0441 10/26/15 0517 10/27/15 0602  Weight: 130.9 kg (288 lb 9.3 oz) 129.5 kg (285 lb 7.9 oz) 129.6 kg (285 lb 11.5 oz)    Intake/Output: I/O last 3 completed shifts: In: 4521.4 [I.V.:1859.7; NG/GT:2611.7; IV Piggyback:50] Out: 1287 [Urine:3725]   Intake/Output this shift:     Physical Exam: General: Critically ill, intubated, sedated  Head: +ETT, +OGT  Eyes: Eyes open  Neck: No masses  Lungs:  Vent assisted FiO2 60%  Heart: Regular, no rub   Abdomen:  Soft, nontender, obese  Extremities: 3+ peripheral edema. +anasarca  Neurologic: Eyes open, trying to look around, sedated  Skin: Feet in soft support  Access: RIJ vascath 10/20/15 Dr. Stevenson Clinch    Basic Metabolic Panel:  Recent Labs Lab 10/21/15 1755 10/22/15 0503 10/22/15 1323 10/22/15 2053 10/23/15 0449 10/24/15 0444 10/25/15 0414 10/25/15 1709 10/26/15 0400 10/27/15 0433  NA 136 137  137 137 139 138 138 142  --  145 145  K 4.2 4.0  4.0 3.9 3.7 3.6 3.7 2.9* 3.3* 3.2* 3.3*  CL 101 101  102 102 100* 100* 103 104  --  110 110  CO2 '23 26  24 27 26 25 26 28  '$ --  29 27  GLUCOSE 317* 364*  356* 314* 199* 246* 238* 231*  --  232* 197*  BUN 34* 35*  34* 41* 49* 60* 91* 82*  --  89* 82*  CREATININE 1.59* 1.34*  1.34* 1.72* 2.06* 2.48* 2.95* 2.36*  --  2.11* 1.80*  CALCIUM 7.6* 7.7*  7.6* 7.9* 7.8* 7.7* 7.5* 7.5*  --  7.5* 7.3*  MG 1.6* 1.6*  --   --  2.2  2.3  --   --  2.3  --   PHOS 2.3* 1.9* 1.7* 2.8 2.8 3.2  --   --  4.5  --     Liver Function Tests:  Recent Labs Lab 10/20/15 0957 10/21/15 0515  10/22/15 0503 10/22/15 1323 10/22/15 2053 10/23/15 0449 10/24/15 0444 10/26/15 0400  AST 124* 312*  --  428*  --   --   --   --  92*  ALT 63* 176*  --  276*  --   --   --   --  174*  ALKPHOS 567* 391*  --  209*  --   --   --   --  188*  BILITOT 2.5* 3.1*  --  4.8*  --   --   --   --  2.1*  PROT 7.4 5.3*  --  4.9*  --   --   --   --  5.3*  ALBUMIN 3.1* 2.1*  < > 1.8*  1.8* 1.8* 1.8* 1.7* 1.7* 1.7*  < > = values in this interval not displayed.  Recent Labs Lab 10/20/15 0957  LIPASE 11    Recent Labs  Lab 10/20/15 0957  AMMONIA 34    CBC:  Recent Labs Lab 10/20/15 0957  10/23/15 0911 10/24/15 0444 10/25/15 0414 10/26/15 0400 10/27/15 0433  WBC 50.5*  < > 23.4* 28.5* 25.4* 23.6* 27.6*  NEUTROABS 47.0*  --   --   --   --   --   --   HGB 16.4*  < > 12.1 11.4* 10.8* 10.7* 10.0*  HCT 49.9*  < > 36.1 33.9* 31.8* 32.2* 30.4*  MCV 90.2  < > 88.2 88.5 87.5 89.3 91.0  PLT 62*  < > 10* 16* 36* 59* 82*  < > = values in this interval not displayed.  Cardiac Enzymes:  Recent Labs Lab 10/20/15 0957 10/20/15 1010 10/20/15 2242 10/21/15 1755 10/22/15 0106 10/22/15 0503  CKTOTAL  --  1562* 779*  --   --  162  TROPONINI 1.21*  --  1.08* 0.44* 0.27*  --     BNP: Invalid input(s): POCBNP  CBG:  Recent Labs Lab 10/27/15 0037 10/27/15 0135 10/27/15 0222 10/27/15 0411 10/27/15 0803  GLUCAP 153* 147* 129* 134* 183*    Microbiology: Results for orders placed or performed during the hospital encounter of 10/20/15  Urine culture     Status: None   Collection Time: 10/20/15  9:57 AM  Result Value Ref Range Status   Specimen Description URINE, RANDOM  Final   Special Requests NONE  Final   Culture >=100,000 COLONIES/mL PROTEUS MIRABILIS  Final   Report Status 10/22/2015 FINAL  Final   Organism ID, Bacteria PROTEUS  MIRABILIS  Final      Susceptibility   Proteus mirabilis - MIC*    AMPICILLIN <=2 SENSITIVE Sensitive     GENTAMICIN <=1 SENSITIVE Sensitive     CEFTRIAXONE Value in next row Sensitive      SENSITIVE<=1    CIPROFLOXACIN Value in next row Sensitive      SENSITIVE<=0.25    IMIPENEM Value in next row Sensitive      SENSITIVE2    NITROFURANTOIN Value in next row Resistant      RESISTANT128    TRIMETH/SULFA Value in next row Sensitive      SENSITIVE<=20    PIP/TAZO Value in next row Sensitive      SENSITIVE<=4    AMPICILLIN/SULBACTAM Value in next row Sensitive      SENSITIVE<=2    * >=100,000 COLONIES/mL PROTEUS MIRABILIS  Culture, blood (routine x 2)     Status: None   Collection Time: 10/20/15  9:57 AM  Result Value Ref Range Status   Specimen Description BLOOD RIGHT WRIST  Final   Special Requests   Final    BOTTLES DRAWN AEROBIC AND ANAEROBIC ANA 1ML AER 4ML   Culture  Setup Time   Final    GRAM NEGATIVE RODS IN BOTH AEROBIC AND ANAEROBIC BOTTLES CRITICAL RESULT CALLED TO, READ BACK BY AND VERIFIED WITH: MATT MCBANE ON 10/21/15 AT 0005 [facility] CONFIRMED BY [place]    Culture   Final    PROTEUS MIRABILIS IN BOTH AEROBIC AND ANAEROBIC BOTTLES    Report Status 10/22/2015 FINAL  Final   Organism ID, Bacteria PROTEUS MIRABILIS  Final      Susceptibility   Proteus mirabilis - MIC*    AMPICILLIN/SULBACTAM Value in next row Sensitive      SENSITIVE<=2    PIP/TAZO Value in next row Sensitive      SENSITIVE<=4    CEFTRIAXONE Value in next row Sensitive      SENSITIVE<=1  IMIPENEM Value in next row Sensitive      SENSITIVE2    GENTAMICIN Value in next row Sensitive      SENSITIVE<=1    CIPROFLOXACIN Value in next row Sensitive      SENSITIVE<=0.25    * PROTEUS MIRABILIS  Culture, blood (routine x 2)     Status: None   Collection Time: 10/20/15  9:57 AM  Result Value Ref Range Status   Specimen Description BLOOD LEFT HAND  Final   Special Requests   Final    BOTTLES DRAWN  AEROBIC AND ANAEROBIC AER 3ML ANA 2ML   Culture  Setup Time   Final    GRAM NEGATIVE RODS IN BOTH AEROBIC AND ANAEROBIC BOTTLES CRITICAL VALUE NOTED.  VALUE IS CONSISTENT WITH PREVIOUSLY REPORTED AND CALLED VALUE.    Culture   Final    PROTEUS MIRABILIS IN BOTH AEROBIC AND ANAEROBIC BOTTLES    Report Status 10/22/2015 FINAL  Final   Organism ID, Bacteria PROTEUS MIRABILIS  Final      Susceptibility   Proteus mirabilis - MIC*    AMPICILLIN Value in next row Sensitive      SENSITIVE<=2    PIP/TAZO Value in next row Sensitive      SENSITIVE<=4    CEFTAZIDIME Value in next row Sensitive      SENSITIVE<=1    CEFTRIAXONE Value in next row Sensitive      SENSITIVE<=1    CEFEPIME Value in next row Sensitive      SENSITIVE<=1    IMIPENEM Value in next row Sensitive      SENSITIVE2    GENTAMICIN Value in next row Sensitive      SENSITIVE<=1    CIPROFLOXACIN Value in next row Sensitive      SENSITIVE<=0.25    * PROTEUS MIRABILIS  Blood Culture ID Panel (Reflexed)     Status: Abnormal   Collection Time: 10/20/15  9:57 AM  Result Value Ref Range Status   Enterococcus species NOT DETECTED NOT DETECTED Final   Listeria monocytogenes NOT DETECTED NOT DETECTED Final   Staphylococcus species NOT DETECTED NOT DETECTED Final   Staphylococcus aureus NOT DETECTED NOT DETECTED Final   Streptococcus species NOT DETECTED NOT DETECTED Final   Streptococcus agalactiae NOT DETECTED NOT DETECTED Final   Streptococcus pneumoniae NOT DETECTED NOT DETECTED Final   Streptococcus pyogenes NOT DETECTED NOT DETECTED Final   Acinetobacter baumannii NOT DETECTED NOT DETECTED Final   Enterobacteriaceae species DETECTED (A) NOT DETECTED Final    Comment: CRITICAL RESULT CALLED TO, READ BACK BY AND VERIFIED WITH: MATT MCBANE ON 10/21/15 AT 0005 [place]    Enterobacter cloacae complex NOT DETECTED NOT DETECTED Final   Escherichia coli NOT DETECTED NOT DETECTED Final   Klebsiella oxytoca NOT DETECTED NOT DETECTED  Final   Klebsiella pneumoniae NOT DETECTED NOT DETECTED Final   Proteus species (A) NOT DETECTED Final    CRITICAL RESULT CALLED TO, READ BACK BY AND VERIFIED WITH:    Comment: MATT MCBANE ON 10/21/15 AT 0005 [place]   Serratia marcescens NOT DETECTED NOT DETECTED Final   Haemophilus influenzae NOT DETECTED NOT DETECTED Final   Neisseria meningitidis NOT DETECTED NOT DETECTED Final   Pseudomonas aeruginosa NOT DETECTED NOT DETECTED Final   Candida albicans NOT DETECTED NOT DETECTED Final   Candida glabrata NOT DETECTED NOT DETECTED Final   Candida krusei NOT DETECTED NOT DETECTED Final   Candida parapsilosis NOT DETECTED NOT DETECTED Final   Candida tropicalis NOT DETECTED NOT DETECTED Final  Carbapenem resistance NOT DETECTED NOT DETECTED Final   Methicillin resistance NOT DETECTED NOT DETECTED Final   Vancomycin resistance NOT DETECTED NOT DETECTED Final  MRSA PCR Screening     Status: None   Collection Time: 10/20/15  7:41 PM  Result Value Ref Range Status   MRSA by PCR NEGATIVE NEGATIVE Final    Comment:        The GeneXpert MRSA Assay (FDA approved for NASAL specimens only), is one component of a comprehensive MRSA colonization surveillance program. It is not intended to diagnose MRSA infection nor to guide or monitor treatment for MRSA infections.   CULTURE, BLOOD (ROUTINE X 2) w Reflex to PCR ID Panel     Status: None (Preliminary result)   Collection Time: 10/22/15 11:39 AM  Result Value Ref Range Status   Specimen Description BLOOD LEFT HAND  Final   Special Requests BOTTLES DRAWN AEROBIC AND ANAEROBIC 1CC  Final   Culture NO GROWTH 4 DAYS  Final   Report Status PENDING  Incomplete  Culture, expectorated sputum-assessment     Status: None   Collection Time: 10/22/15 12:00 PM  Result Value Ref Range Status   Specimen Description SPUTUM  Final   Special Requests Normal  Final   Sputum evaluation THIS SPECIMEN IS ACCEPTABLE FOR SPUTUM CULTURE  Final   Report Status  10/22/2015 FINAL  Final  Culture, respiratory (NON-Expectorated)     Status: None   Collection Time: 10/22/15 12:00 PM  Result Value Ref Range Status   Specimen Description SPUTUM  Final   Special Requests Normal Reflexed from [id number]  Final   Gram Stain   Final    FAIR SPECIMEN - 70-80% WBCS FEW WBC SEEN RARE YEAST RARE GRAM NEGATIVE RODS    Culture LIGHT GROWTH ENTEROBACTER AEROGENES  Final   Report Status 10/24/2015 FINAL  Final   Organism ID, Bacteria ENTEROBACTER AEROGENES  Final      Susceptibility   Enterobacter aerogenes - MIC*    CEFAZOLIN >=64 RESISTANT Resistant     CEFEPIME <=1 SENSITIVE Sensitive     CEFTAZIDIME <=1 SENSITIVE Sensitive     CEFTRIAXONE <=1 SENSITIVE Sensitive     CIPROFLOXACIN <=0.25 SENSITIVE Sensitive     GENTAMICIN <=1 SENSITIVE Sensitive     IMIPENEM 2 SENSITIVE Sensitive     TRIMETH/SULFA <=20 SENSITIVE Sensitive     PIP/TAZO <=4 SENSITIVE Sensitive     * LIGHT GROWTH ENTEROBACTER AEROGENES  Urine culture     Status: None   Collection Time: 10/22/15 12:45 PM  Result Value Ref Range Status   Specimen Description URINE, RANDOM  Final   Special Requests NONE  Final   Culture NO GROWTH 2 DAYS  Final   Report Status 10/24/2015 FINAL  Final    Coagulation Studies: No results for input(s): LABPROT, INR in the last 72 hours.  Urinalysis: No results for input(s): COLORURINE, LABSPEC, PHURINE, GLUCOSEU, HGBUR, BILIRUBINUR, KETONESUR, PROTEINUR, UROBILINOGEN, NITRITE, LEUKOCYTESUR in the last 72 hours.  Invalid input(s): APPERANCEUR    Imaging: Dg Chest 1 View  10/26/2015  CLINICAL DATA:  Unresponsive.  Intubation . EXAM: CHEST 1 VIEW COMPARISON:  10/23/2015. FINDINGS: Endotracheal tube, NG tube, right IJ line, left IJ line in stable position. Cardiomegaly with bilateral pulmonary alveolar infiltrates consistent congestive heart failure bilateral pulmonary edema. Low lung volumes with basilar atelectasis. Small bilateral pleural effusions. No  pneumothorax. IMPRESSION: 1. Lines and tubes in stable position. 2. Congestive heart failure with bilateral pulmonary edema and small pleural effusions. Slight  interim progression from prior exam. 3. Lung volumes with basilar atelectasis. Electronically Signed   By: Marcello Moores  Register   On: 10/26/2015 07:24     Medications:   . feeding supplement (VITAL AF 1.2 CAL) 1,000 mL (10/27/15 0602)  . fentaNYL infusion INTRAVENOUS Stopped (10/27/15 0817)  . insulin (NOVOLIN-R) infusion Stopped (10/27/15 0239)  . norepinephrine (LEVOPHED) Adult infusion Stopped (10/25/15 2259)  . pureflow 2,000 mL/hr at 10/22/15 0104  . vasopressin (PITRESSIN) infusion - *FOR SHOCK* Stopped (10/23/15 0950)   . amiodarone  400 mg Oral BID  . antiseptic oral rinse  7 mL Mouth Rinse 10 times per day  . cefTRIAXone (ROCEPHIN)  IV  2 g Intravenous Q24H  . chlorhexidine gluconate  15 mL Mouth Rinse BID  . feeding supplement (PRO-STAT SUGAR FREE 64)  30 mL Per Tube TID  . free water  100 mL Per Tube 3 times per day  . insulin aspart  0-15 Units Subcutaneous 6 times per day  . insulin detemir  13 Units Subcutaneous QHS  . ipratropium-albuterol  3 mL Nebulization Q6H  . oxyCODONE  5 mg Oral Q6H  . pantoprazole sodium  40 mg Per Tube Q24H  . senna-docusate  1 tablet Oral BID   acetaminophen **OR** acetaminophen, LORazepam  Assessment/ Plan:  April [name] is a 70 y.o. white female with DM (A1c 7.7%),  who was admitted to [facility] on 10/20/2015  1. Acute renal failure with metabolic acidosis:  - Unknown baseline creatinine. Admit Cr 3.01 Required CRRT from 1/24 to 1/27. Then intermittent hemodialysis 1/28.  - Acute renal failure from sepsis leading to ATN. Serologic testing negative 10/21/15 - Now nonoliguric with good urine output. >2500 - Electrolytes and Volume status are acceptable No acute indication for Dialysis at present   2. Right Hydronephrosis - seen on CT scan - will need follow up study and urology  evaluation  3. Hypokalemia: post ATN diuresis.  - potassium being replaced. prn  4. 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 Platelets now trending back up.  - argatroban   LOS: 7 Cashel Bellina 1/31/20179:07 AM

## 2015-10-27 NOTE — Progress Notes (Signed)
[facility] Progess Note    ASSESSMENT/PLAN   UTI Proteus/sepsis.   PULMONARY OETT 7.74m Respiratory failure - hypoxic Septic shock -slowly improving P:  - cont with MV - prn ABG - wean MV as tolerated - maintain sats >90% - WUA\SBT daily - wean fentanyl, started valium   CARDIOVASCULAR CVL - RIJ VASCATH, LIJ CVL Shock - septic hypotension NSTEMI Afib - started on amiodarone 1/24  P:  - maintain MAP >65 - vasopressors as needed - currently weaned off.  - monitor BP - trend CE - heparin gtt on hold due to low plt - mostly likely demand ischemia - EKG reviewed, no sig ST changes.  - cont with amiodarone - cardiology consult appreciated.   RENAL Anuria ARF UTI ?Rhabdomyolysis - CK improving, unlikely rhabdo Right hydronephrosis Proteus Bacteremia, Proteus UTI P:  -monitor Cr -continued leukocytosis, wd/w nephro, will repeat CT a/p due to hydronephrosis--possible source of sepsis? - meropenem in this severe septic patient.   GASTROINTESTINAL SUP - PPI Elevated LFTs - trend TF  HEMATOLOGIC Leukocytosis Thrombocytopenia - lower today P:  -most likely related to septic shock - source urine, now with Proteus Bacteremia -follow CBC -cont abx -stopped heparin 1/24 -CRRT on hold for 24 hrs again, reevaluate on 1/28 AM  INFECTIOUS Sepsis - Proteus UTI and Bacteremia Still spiking fevers on antibiotics P:  - cont with current abx - Bld and UC Cx with Protues, narrowed abx to ceftriaxone on 1/25 - broaden abx to meropenem on 1/27  ENDOCRINE ICU hypo/hyperglycemia protocol  NEUROLOGIC A: Acute encephalopathy P:  RASS goal: -1 - metabolic encephalopathy - cont to monitor neuro status.     ---------------------------------------   ----------------------------------------   Name: KCORLETTE CIANOMRN: [id number]: [birth year]   ADMISSION DATE:  10/20/2015    CHIEF COMPLAINT:   dyspnea    SUBJECTIVE:   Pt currently on the ventilator, can not provide history or review of systems.    VITAL SIGNS: Temp:  [97.3 F (36.3 C)-99.5 F (37.5 C)] 98.8 F (37.1 C) (01/31 0700) Pulse Rate:  [58-110] 99 (01/31 0700) Resp:  [20-26] 22 (01/31 0700) BP: (96-154)/(42-70) 149/62 mmHg (01/31 0700) SpO2:  [92 %-100 %] 100 % (01/31 0757) FiO2 (%):  [60 %] 60 % (01/31 0757) Weight:  [285 lb 11.5 oz (129.6 kg)] 285 lb 11.5 oz (129.6 kg) (01/31 0602) HEMODYNAMICS:   VENTILATOR SETTINGS: Vent Mode:  [-] PRVC FiO2 (%):  [60 %] 60 % Set Rate:  [25 bmp] 25 bmp Vt Set:  [550 mL] 550 mL PEEP:  [0 cmH20] 0 cmH20 INTAKE / OUTPUT:  Intake/Output Summary (Last 24 hours) at 10/27/15 0851 Last data filed at 10/27/15 0602  Gross per 24 hour  Intake 2517.18 ml  Output   2570 ml  Net -52.82 ml    PHYSICAL EXAMINATION: Physical Examination:   VS: BP 149/62 mmHg  Pulse 99  Temp(Src) 98.8 F (37.1 C) (Core (Comment))  Resp 22  Ht '5\' 7"'$  (1.702 m)  Wt 285 lb 11.5 oz (129.6 kg)  BMI 44.74 kg/m2  SpO2 100%  General Appearance: No distress  Neuro:without focal findings, mental status normal. HEENT: PERRLA, EOM intact. Pulmonary: normal breath sounds   CardiovascularNormal S1,S2.  No m/r/g.   Abdomen: Benign, Soft, non-tender. Renal:  No costovertebral tenderness  GU:  Not performed at this time. Endocrine: No evident thyromegaly. Skin:   warm, no rashes, no ecchymosis  Extremities: normal, no cyanosis, clubbing.   LABS:  LABORATORY PANEL:   CBC  Recent Labs Lab 10/27/15 0433  WBC 27.6*  HGB 10.0*  HCT 30.4*  PLT 82*    Chemistries   Recent Labs Lab 10/26/15 0400 10/27/15 0433  NA 145 145  K 3.2* 3.3*  CL 110 110  CO2 29 27  GLUCOSE 232* 197*  BUN 89* 82*  CREATININE 2.11* 1.80*  CALCIUM 7.5* 7.3*  MG 2.3  --   PHOS 4.5  --   AST 92*  --   ALT 174*  --   ALKPHOS 188*  --   BILITOT 2.1*  --      Recent Labs Lab 10/27/15 0008  10/27/15 0037 10/27/15 0135 10/27/15 0222 10/27/15 0411 10/27/15 0803  GLUCAP 122* 153* 147* 129* 134* 183*    Recent Labs Lab 10/21/15 0847 10/25/15 2305 10/26/15 0401  PHART 7.35 7.56* 7.49*  PCO2ART 33 29* 36  PO2ART 193* 96 72*    Recent Labs Lab 10/21/15 0515  10/22/15 0503  10/23/15 0449 10/24/15 0444 10/26/15 0400  AST 312*  --  428*  --   --   --  92*  ALT 176*  --  276*  --   --   --  174*  ALKPHOS 391*  --  209*  --   --   --  188*  BILITOT 3.1*  --  4.8*  --   --   --  2.1*  ALBUMIN 2.1*  < > 1.8*  1.8*  < > 1.7* 1.7* 1.7*  < > = values in this interval not displayed.  Cardiac Enzymes  Recent Labs Lab 10/22/15 0106  TROPONINI 0.27*    RADIOLOGY:  Dg Chest 1 View  10/26/2015  CLINICAL DATA:  Unresponsive.  Intubation . EXAM: CHEST 1 VIEW COMPARISON:  10/23/2015. FINDINGS: Endotracheal tube, NG tube, right IJ line, left IJ line in stable position. Cardiomegaly with bilateral pulmonary alveolar infiltrates consistent congestive heart failure bilateral pulmonary edema. Low lung volumes with basilar atelectasis. Small bilateral pleural effusions. No pneumothorax. IMPRESSION: 1. Lines and tubes in stable position. 2. Congestive heart failure with bilateral pulmonary edema and small pleural effusions. Slight interim progression from prior exam. 3. Lung volumes with basilar atelectasis. Electronically Signed   By: Marcello Moores  Register   On: 10/26/2015 07:24       --Marda Stalker, MD.  Pager [phone] [facility] Office Number: [phone]  Patricia Pesa, M.D.  Vilinda Boehringer, M.D.  Merton Border, M.D  [place].  I have personally obtained a history, examined the patient, evaluated laboratory and imaging results, formulated the assessment and plan and placed orders. The Patient requires high complexity decision making for assessment and support, frequent evaluation and titration of therapies, application of  advanced monitoring technologies and extensive interpretation of multiple databases. The patient has critical illness that could lead imminently to failure of 1 or more organ systems and requires the highest level of physician preparedness to intervene.  Critical Care Time devoted to patient care services described in this note is 35 minutes and is exclusive of time spent in procedures.

## 2015-10-28 ENCOUNTER — Inpatient Hospital Stay: Payer: Medicare HMO

## 2015-10-28 LAB — URINALYSIS COMPLETE WITH MICROSCOPIC (ARMC ONLY)
Bilirubin Urine: NEGATIVE
Glucose, UA: 50 mg/dL — AB
Ketones, ur: NEGATIVE mg/dL
Nitrite: NEGATIVE
PH: 5 (ref 5.0–8.0)
Protein, ur: 30 mg/dL — AB
SPECIFIC GRAVITY, URINE: 1.014 (ref 1.005–1.030)

## 2015-10-28 LAB — CBC
HCT: 29.9 % — ABNORMAL LOW (ref 35.0–47.0)
Hemoglobin: 9.7 g/dL — ABNORMAL LOW (ref 12.0–16.0)
MCH: 30.1 pg (ref 26.0–34.0)
MCHC: 32.4 g/dL (ref 32.0–36.0)
MCV: 93 fL (ref 80.0–100.0)
PLATELETS: 109 10*3/uL — AB (ref 150–440)
RBC: 3.21 MIL/uL — AB (ref 3.80–5.20)
RDW: 14.3 % (ref 11.5–14.5)
WBC: 26.2 10*3/uL — AB (ref 3.6–11.0)

## 2015-10-28 LAB — BLOOD GAS, ARTERIAL
ACID-BASE EXCESS: 5.8 mmol/L — AB (ref 0.0–3.0)
Allens test (pass/fail): POSITIVE — AB
BICARBONATE: 30.6 meq/L — AB (ref 21.0–28.0)
FIO2: 0.4
MECHANICAL RATE: 25
O2 SAT: 94.8 %
PATIENT TEMPERATURE: 37
PCO2 ART: 44 mmHg (ref 32.0–48.0)
PH ART: 7.45 (ref 7.350–7.450)
PO2 ART: 71 mmHg — AB (ref 83.0–108.0)
VT: 550 mL

## 2015-10-28 LAB — MAGNESIUM: MAGNESIUM: 2.3 mg/dL (ref 1.7–2.4)

## 2015-10-28 LAB — GLUCOSE, CAPILLARY
GLUCOSE-CAPILLARY: 193 mg/dL — AB (ref 65–99)
GLUCOSE-CAPILLARY: 209 mg/dL — AB (ref 65–99)
GLUCOSE-CAPILLARY: 220 mg/dL — AB (ref 65–99)
Glucose-Capillary: 176 mg/dL — ABNORMAL HIGH (ref 65–99)
Glucose-Capillary: 167 mg/dL — ABNORMAL HIGH (ref 65–99)
Glucose-Capillary: 207 mg/dL — ABNORMAL HIGH (ref 65–99)
Glucose-Capillary: 165 mg/dL — ABNORMAL HIGH (ref 65–99)

## 2015-10-28 LAB — BASIC METABOLIC PANEL
ANION GAP: 4 — AB (ref 5–15)
BUN: 71 mg/dL — ABNORMAL HIGH (ref 6–20)
CALCIUM: 7.7 mg/dL — AB (ref 8.9–10.3)
CO2: 29 mmol/L (ref 22–32)
Chloride: 119 mmol/L — ABNORMAL HIGH (ref 101–111)
Creatinine, Ser: 1.6 mg/dL — ABNORMAL HIGH (ref 0.44–1.00)
GFR, EST AFRICAN AMERICAN: 37 mL/min — AB (ref >60)
GFR, EST NON AFRICAN AMERICAN: 32 mL/min — AB (ref >60)
Glucose, Bld: 209 mg/dL — ABNORMAL HIGH (ref 65–99)
Potassium: 3.9 mmol/L (ref 3.5–5.1)
SODIUM: 152 mmol/L — AB (ref 135–145)

## 2015-10-28 MED ORDER — DEXTROSE 5 % IV SOLN
2.0000 g | Freq: Two times a day (BID) | INTRAVENOUS | Status: DC
  Administered 2015-10-28 – 2015-11-02 (×10): 2 g via INTRAVENOUS
  Filled 2015-10-28 (×12): qty 2

## 2015-10-28 MED ORDER — DEXTROSE 5 % IV SOLN
1.0000 g | Freq: Two times a day (BID) | INTRAVENOUS | Status: DC
  Administered 2015-10-28: 1 g via INTRAVENOUS
  Filled 2015-10-28 (×2): qty 1

## 2015-10-28 MED ORDER — PNEUMOCOCCAL VAC POLYVALENT 25 MCG/0.5ML IJ INJ
0.5000 mL | INJECTION | INTRAMUSCULAR | Status: AC
  Administered 2015-10-29: 0.5 mL via INTRAMUSCULAR
  Filled 2015-10-28: qty 0.5

## 2015-10-28 MED ORDER — FREE WATER
200.0000 mL | Status: DC
  Administered 2015-10-28 – 2015-10-29 (×15): 200 mL

## 2015-10-28 MED ORDER — INFLUENZA VAC SPLIT QUAD 0.5 ML IM SUSY
0.5000 mL | PREFILLED_SYRINGE | INTRAMUSCULAR | Status: AC
Start: 2015-10-29 — End: 2015-10-29
  Administered 2015-10-29: 0.5 mL via INTRAMUSCULAR
  Filled 2015-10-28: qty 0.5

## 2015-10-28 NOTE — Progress Notes (Signed)
[facility] Progess Note    ASSESSMENT/PLAN   Severe septic shock due to UTI Proteus/sepsis.   PULMONARY OETT 7.54m Respiratory failure - hypoxic Septic shock -slowly improving Reviewed CT and chest x-ray images reports, appears to be his suggestive of bibasilar atelectasis with small bibasilar effusions, possibility of pneumonia, particularly in light of continued fevers.  P:  ABG this a.m. shows a pH 7.45, PCO2 44, bicarbonate 30.6. Appears to be adequately compensated. Vent settings reviewed, vital volume set at 550 with respiratory rate of 25, PEEP of 0. Will  increase PEEP to 5.. -We will expand antibiotic coverage  CARDIOVASCULAR CVL - RIJ VASCATH, LIJ CVL Shock - septic, hypotension, improved, patient has been weaned off of pressors. NSTEMI Afib - started on amiodarone 1/24  P:  - maintain MAP >65 - vasopressors as needed - currently weaned off.  - monitor BP - trend CE - heparin gtt on hold due to low plt - mostly likely demand ischemia - EKG reviewed, no sig ST changes.  - cont with amiodarone - cardiology consult appreciated.   RENAL  Status post ARF-kidney function continues to improve, with decreasing creatinine and good urine output. UTI Mild hypernatremia with a sodium of 152 this morning. Right hydronephrosis Proteus Bacteremia, Proteus UTI, CT abdomen reviewed-no evidence of abdominal abscess. P:  -monitor Cr -continued leukocytosis, wd/w nephro, will repeat CT a/p due to hydronephrosis--possible source of sepsis? - meropenem in this severe septic patient.   GASTROINTESTINAL Tube feeds with vital- PPI Elevated LFTs - trend TF  HEMATOLOGIC Leukocytosis Thrombocytopenia - lower today P:  -most likely related to septic shock - source urine, now with Proteus Bacteremia -follow CBC -cont abx   INFECTIOUS Sepsis - Proteus UTI and Bacteremia Still spiking fevers on antibiotics, MAXIMUM TEMPERATURE of 101.3  early this morning. P:  - cont with current abx - Bld and UC Cx with Protues, narrowed abx to ceftriaxone on 1/25 - broaden abx to meropenem on 1/27  ENDOCRINE ICU hypo/hyperglycemia protocol  NEUROLOGIC A: Acute encephalopathy P:  RASS goal: -1 - metabolic encephalopathy - cont to monitor neuro status.    Ceftriaxone 10/26/2015>> February 1 17 Cefepime February 1 17>>  DVT prophylaxis with Lovenox. Sputum culture 10/22/2015 positive for Enterobacter. Urine culture 10/22/2015 negative thus far. Blood culture 10/22/2015: Negative. Urine culture 10/20/2015: Positive for Proteus mirabilis..Marland KitchenMRSA screen negative ---------------------------------------   ----------------------------------------   Name: April DOLINMRN: [id number]: [birth year]   ADMISSION DATE:  10/20/2015    CHIEF COMPLAINT:  dyspnea    SUBJECTIVE:   Pt currently on the ventilator, can not provide history or review of systems.    VITAL SIGNS: Temp:  [99.1 F (37.3 C)-101.3 F (38.5 C)] 101.1 F (38.4 C) (02/01 0700) Pulse Rate:  [74-101] 85 (02/01 0700) Resp:  [22-26] 25 (02/01 0700) BP: (104-154)/(28-78) 120/47 mmHg (02/01 0700) SpO2:  [90 %-100 %] 98 % (02/01 0700) FiO2 (%):  [40 %-60 %] 40 % (02/01 0256) Weight:  [290 lb 5.5 oz (131.7 kg)] 290 lb 5.5 oz (131.7 kg) (02/01 0500) HEMODYNAMICS:   VENTILATOR SETTINGS: Vent Mode:  [-] PRVC FiO2 (%):  [40 %-60 %] 40 % Set Rate:  [25 bmp] 25 bmp Vt Set:  [550 mL] 550 mL PEEP:  [0 cmH20] 0 cmH20 Plateau Pressure:  [24 cmH20] 24 cmH20 INTAKE / OUTPUT:  Intake/Output Summary (Last 24 hours) at 10/28/15 0828 Last data filed at 10/28/15 0700  Gross per 24 hour  Intake 1952.8 ml  Output   3700 ml  Net -1747.2 ml    PHYSICAL EXAMINATION: Physical Examination:   VS: BP 120/47 mmHg  Pulse 85  Temp(Src) 101.1 F (38.4 C) (Oral)  Resp 25  Ht '5\' 7"'$  (1.702 m)  Wt 290 lb 5.5 oz (131.7 kg)  BMI 45.46 kg/m2  SpO2 98%  General  Appearance: No distress  Neuro:without focal findings, mental status normal. HEENT: PERRLA, EOM intact. Pulmonary: normal breath sounds   CardiovascularNormal S1,S2.  No m/r/g.   Abdomen: Benign, Soft, non-tender. Renal:  No costovertebral tenderness  GU:  Not performed at this time. Endocrine: No evident thyromegaly. Skin:   warm, no rashes, no ecchymosis  Extremities: normal, no cyanosis, clubbing.   LABS:   LABORATORY PANEL:   CBC  Recent Labs Lab 10/28/15 0443  WBC 26.2*  HGB 9.7*  HCT 29.9*  PLT 109*    Chemistries   Recent Labs Lab 10/26/15 0400  10/28/15 0443  NA 145  < > 152*  K 3.2*  < > 3.9  CL 110  < > 119*  CO2 29  < > 29  GLUCOSE 232*  < > 209*  BUN 89*  < > 71*  CREATININE 2.11*  < > 1.60*  CALCIUM 7.5*  < > 7.7*  MG 2.3  --  2.3  PHOS 4.5  --   --   AST 92*  --   --   ALT 174*  --   --   ALKPHOS 188*  --   --   BILITOT 2.1*  --   --   < > = values in this interval not displayed.   Recent Labs Lab 10/27/15 1232 10/27/15 1558 10/27/15 1932 10/27/15 2353 10/28/15 0348 10/28/15 0742  GLUCAP 212* 171* 207* 193* 176* 167*    Recent Labs Lab 10/25/15 2305 10/26/15 0401 10/28/15 0437  PHART 7.56* 7.49* 7.45  PCO2ART 29* 36 44  PO2ART 96 72* 71*    Recent Labs Lab 10/22/15 0503  10/23/15 0449 10/24/15 0444 10/26/15 0400  AST 428*  --   --   --  92*  ALT 276*  --   --   --  174*  ALKPHOS 209*  --   --   --  188*  BILITOT 4.8*  --   --   --  2.1*  ALBUMIN 1.8*  1.8*  < > 1.7* 1.7* 1.7*  < > = values in this interval not displayed.  Cardiac Enzymes  Recent Labs Lab 10/22/15 0106  TROPONINI 0.27*    RADIOLOGY:  Ct Abdomen Pelvis Wo Contrast  10/27/2015  CLINICAL DATA:  Sepsis. EXAM: CT ABDOMEN AND PELVIS WITHOUT CONTRAST TECHNIQUE: Multidetector CT imaging of the abdomen and pelvis was performed following the standard protocol without IV contrast. COMPARISON:  CT scan of October 20, 2015. FINDINGS: Severe degenerative  disc disease is noted at L4-5. Mild bilateral pleural effusions are noted with adjacent atelectasis. No gallstones are noted. No focal abnormality is noted in the liver, spleen or pancreas on these unenhanced images. Nasogastric tube tip is seen in proximal stomach. Adrenal glands appear normal. Left kidney and ureter appear normal. Moderate right hydronephrosis is again noted with nonobstructive calculus seen in upper pole calyx. 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 Atherosclerosis of abdominal aorta is noted without aneurysm formation. There is no evidence of bowel obstruction. The appendix appears normal. There is no evidence of bowel obstruction. Urinary bladder is decompressed secondary to Foley catheter. Stable left  ovarian cyst is noted. Uterus and right ovary appear normal. No significant adenopathy is noted. Mild anasarca is noted. IMPRESSION: Atherosclerosis of abdominal aorta without aneurysm formation. Nonobstructive right renal calculus is again noted. 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 Mild anasarca is noted. Mild bilateral pleural effusions are noted with adjacent atelectasis. Electronically Signed   By: Marijo Conception, M.D.   On: 10/27/2015 17:09   Dg Chest 1 View  10/28/2015  CLINICAL DATA:  Dyspnea, sepsis, acute renal failure, respiratory failure, intubated patient. EXAM: CHEST 1 VIEW COMPARISON:  Portable chest x-ray of October 26, 2015 FINDINGS: The lungs are mildly hypoinflated today. The pulmonary interstitial markings remain increased bilaterally. The hemidiaphragms are now obscured bilaterally. The cardiac silhouette is poorly defined but appears mildly enlarged. The central pulmonary vascularity is prominent. There are probable bilateral pleural effusions. The endotracheal tube tip lies approximately 3 cm above the carina. The right internal jugular venous catheter  tip projects over the midportion of the SVC. The left internal jugular venous catheter tip projects over the proximal SVC. The esophagogastric tube tip projects below the inferior margin of the image. IMPRESSION: Worsening of pulmonary interstitial edema and bilateral pleural effusions. This is likely secondary to CHF though bilateral interstitial pneumonia could produce similar findings. Electronically Signed   By: David  Martinique M.D.   On: 10/28/2015 07:35   Ct Head Wo Contrast  10/27/2015  CLINICAL DATA:  Found lying on the floor minimally responsive EXAM: CT HEAD WITHOUT CONTRAST TECHNIQUE: Contiguous axial images were obtained from the base of the skull through the vertex without intravenous contrast. COMPARISON:  10/20/2015 FINDINGS: No acute cortical infarct, hemorrhage, or mass lesion ispresent. Ventricles are of normal size. No significant extra-axial fluid collection is present. The paranasal sinuses andmastoid air cells are clear. The osseous skull is intact. ET tube is identified. There is mild mucosal thickening involving the left maxillary sinus. The mastoid air cells are clear. The calvarium is intact. IMPRESSION: 1. No acute intracranial abnormalities identified. Electronically Signed   By: Kerby Moors M.D.   On: 10/27/2015 17:00       --Marda Stalker, MD.  Pager [phone] [facility] Office Number: [phone]  Patricia Pesa, M.D.  Vilinda Boehringer, M.D.  Merton Border, M.D  [place].  I have personally obtained a history, examined the patient, evaluated laboratory and imaging results, formulated the assessment and plan and placed orders. The Patient requires high complexity decision making for assessment and support, frequent evaluation and titration of therapies, application of advanced monitoring technologies and extensive interpretation of multiple databases. The patient has critical illness that could lead imminently to failure of  1 or more organ systems and requires the highest level of physician preparedness to intervene.  Critical Care Time devoted to patient care services described in this note is 35 minutes and is exclusive of time spent in procedures.

## 2015-10-28 NOTE — Progress Notes (Signed)
Nutrition Follow-up   INTERVENTION:   EN: recommend continuing current TF regimen, continue to assess. RD notes per Nephrology free water increased to 225m q 2hours for 24029mdaily; note 9721mf free water provided in current TF infusing at 35m73m   NUTRITION DIAGNOSIS:   Inadequate oral intake related to acute illness as evidenced by NPO status.  GOAL:   Provide needs based on ASPEN/SCCM guidelines  MONITOR:    (Energy Intake, Pulmonary, Digestive System, Electrolyte/Renal Profile, Glucose Profile)  REASON FOR ASSESSMENT:   Ventilator    ASSESSMENT:    Pt remains on the vent, and remains off dialysis currently, Nephrology following.  Diet Order:  Diet NPO time specified    Current Nutrition: tolerating Vital 1.2 at 35mL58m Prostat TID   Gastrointestinal Profile: abdomen distended Last BM: 10/27/2015 per Nsg   Scheduled Medications:  . amiodarone  400 mg Oral BID  . antiseptic oral rinse  7 mL Mouth Rinse 10 times per day  . ceFEPime (MAXIPIME) IV  2 g Intravenous Q12H  . chlorhexidine gluconate  15 mL Mouth Rinse BID  . feeding supplement (PRO-STAT SUGAR FREE 64)  30 mL Per Tube TID  . free water  200 mL Per Tube Q2H  . insulin aspart  0-15 Units Subcutaneous 6 times per day  . insulin detemir  13 Units Subcutaneous QHS  . ipratropium-albuterol  3 mL Nebulization Q6H  . oxyCODONE  5 mg Oral Q6H  . pantoprazole sodium  40 mg Per Tube Q24H  . potassium chloride  40 mEq Oral Daily  . senna-docusate  1 tablet Oral BID    Continuous Medications:  . feeding supplement (VITAL AF 1.2 CAL) 1,000 mL (10/28/15 0600)  . fentaNYL infusion INTRAVENOUS 125 mcg/hr (10/28/15 1105)  . norepinephrine (LEVOPHED) Adult infusion Stopped (10/25/15 2259)  . pureflow 2,000 mL/hr at 10/22/15 0104  . vasopressin (PITRESSIN) infusion - *FOR SHOCK* Stopped (10/23/15 0950)     Electrolyte/Renal Profile and Glucose Profile:   Recent Labs Lab 10/23/15 0449 10/24/15 0444   10/26/15 0400 10/27/15 0433 10/28/15 0443  NA 138 138  < > 145 145 152*  K 3.6 3.7  < > 3.2* 3.3* 3.9  CL 100* 103  < > 110 110 119*  CO2 25 26  < > '29 27 29  '$ BUN 60* 91*  < > 89* 82* 71*  CREATININE 2.48* 2.95*  < > 2.11* 1.80* 1.60*  CALCIUM 7.7* 7.5*  < > 7.5* 7.3* 7.7*  MG 2.2 2.3  --  2.3  --  2.3  PHOS 2.8 3.2  --  4.5  --   --   GLUCOSE 246* 238*  < > 232* 197* 209*  < > = values in this interval not displayed. Protein Profile:  Recent Labs Lab 10/23/15 0449 10/24/15 0444 10/26/15 0400  ALBUMIN 1.7* 1.7* 1.7*      Weight Trend since Admission: Filed Weights   10/26/15 0517 10/27/15 0602 10/28/15 0500  Weight: 285 lb 7.9 oz (129.5 kg) 285 lb 11.5 oz (129.6 kg) 290 lb 5.5 oz (131.7 kg)     Skin:  Reviewed, no issues  BMI:  Body mass index is 45.46 kg/(m^2).    Estimated Nutritional Needs:   Kcal:  [id number] (11-14 kcals/kg) using wt of 122.9 kg  Protein:  122-153 g (2.0-2.5 g/kg) using IBW 61 kg  Fluid:  1525-1830 mL (25-30 ml/kg)   EDUCATION NEEDS:   No education needs identified at this time   HIGH Care Level  Dwyane Luo, RD, LDN Pager [phone] Weekend/On-Call Pager [phone]

## 2015-10-28 NOTE — Progress Notes (Signed)
[facility]  ROUNDING NOTE   Subjective:   Last HD was Saturday Now, UOP > 3700 Vent assisted     Objective:  Vital signs in last 24 hours:  Temp:  [99.1 F (37.3 C)-101.3 F (38.5 C)] 101.1 F (38.4 C) (02/01 0900) Pulse Rate:  [74-100] 79 (02/01 0900) Resp:  [22-26] 25 (02/01 0900) BP: (104-154)/(28-78) 115/45 mmHg (02/01 0900) SpO2:  [90 %-100 %] 99 % (02/01 0900) FiO2 (%):  [40 %-60 %] 40 % (02/01 0256) Weight:  [131.7 kg (290 lb 5.5 oz)] 131.7 kg (290 lb 5.5 oz) (02/01 0500)  Weight change: 2.1 kg (4 lb 10.1 oz) Filed Weights   10/26/15 0517 10/27/15 0602 10/28/15 0500  Weight: 129.5 kg (285 lb 7.9 oz) 129.6 kg (285 lb 11.5 oz) 131.7 kg (290 lb 5.5 oz)    Intake/Output: I/O last 3 completed shifts: In: 3298.9 [I.V.:948.9; NG/GT:2300; IV Piggyback:50] Out: 4656 [Urine:5320]   Intake/Output this shift:  Total I/O In: 155 [I.V.:55; NG/GT:100] Out: -   Physical Exam: General: Critically ill, intubated, sedated  Head: +ETT, +OGT  Eyes: Eyes closed  Neck: No masses  Lungs:  Vent assisted  Heart: Regular, no rub   Abdomen:  Soft, nontender, obese  Extremities: 3+ peripheral edema. +anasarca  Neurologic:  sedated  Skin: Feet in soft support  Access: RIJ vascath 10/20/15 Dr. Stevenson Clinch    Basic Metabolic Panel:  Recent Labs Lab 10/22/15 0503 10/22/15 1323 10/22/15 2053 10/23/15 8127 10/24/15 0444 10/25/15 0414 10/25/15 1709 10/26/15 0400 10/27/15 0433 10/28/15 0443  NA 137  137 137 139 138 138 142  --  145 145 152*  K 4.0  4.0 3.9 3.7 3.6 3.7 2.9* 3.3* 3.2* 3.3* 3.9  CL 101  102 102 100* 100* 103 104  --  110 110 119*  CO2 '26  24 27 26 25 26 28  '$ --  '29 27 29  '$ GLUCOSE 364*  356* 314* 199* 246* 238* 231*  --  232* 197* 209*  BUN 35*  34* 41* 49* 60* 91* 82*  --  89* 82* 71*  CREATININE 1.34*  1.34* 1.72* 2.06* 2.48* 2.95* 2.36*  --  2.11* 1.80* 1.60*  CALCIUM 7.7*  7.6* 7.9* 7.8* 7.7* 7.5* 7.5*  --  7.5* 7.3* 7.7*  MG 1.6*  --   --   2.2 2.3  --   --  2.3  --  2.3  PHOS 1.9* 1.7* 2.8 2.8 3.2  --   --  4.5  --   --     Liver Function Tests:  Recent Labs Lab 10/22/15 0503 10/22/15 1323 10/22/15 2053 10/23/15 0449 10/24/15 0444 10/26/15 0400  AST 428*  --   --   --   --  92*  ALT 276*  --   --   --   --  174*  ALKPHOS 209*  --   --   --   --  188*  BILITOT 4.8*  --   --   --   --  2.1*  PROT 4.9*  --   --   --   --  5.3*  ALBUMIN 1.8*  1.8* 1.8* 1.8* 1.7* 1.7* 1.7*   No results for input(s): LIPASE, AMYLASE in the last 168 hours. No results for input(s): AMMONIA in the last 168 hours.  CBC:  Recent Labs Lab 10/24/15 0444 10/25/15 0414 10/26/15 0400 10/27/15 0433 10/28/15 0443  WBC 28.5* 25.4* 23.6* 27.6* 26.2*  HGB 11.4* 10.8* 10.7* 10.0* 9.7*  HCT 33.9* 31.8* 32.2* 30.4* 29.9*  MCV 88.5 87.5 89.3 91.0 93.0  PLT 16* 36* 59* 82* 109*    Cardiac Enzymes:  Recent Labs Lab 10/21/15 1755 10/22/15 0106 10/22/15 0503  CKTOTAL  --   --  162  TROPONINI 0.44* 0.27*  --     BNP: Invalid input(s): POCBNP  CBG:  Recent Labs Lab 10/27/15 1558 10/27/15 1932 10/27/15 2353 10/28/15 0348 10/28/15 0742  GLUCAP 171* 207* 193* 176* 167*    Microbiology: Results for orders placed or performed during the hospital encounter of 10/20/15  Urine culture     Status: None   Collection Time: 10/20/15  9:57 AM  Result Value Ref Range Status   Specimen Description URINE, RANDOM  Final   Special Requests NONE  Final   Culture >=100,000 COLONIES/mL PROTEUS MIRABILIS  Final   Report Status 10/22/2015 FINAL  Final   Organism ID, Bacteria PROTEUS MIRABILIS  Final      Susceptibility   Proteus mirabilis - MIC*    AMPICILLIN <=2 SENSITIVE Sensitive     GENTAMICIN <=1 SENSITIVE Sensitive     CEFTRIAXONE Value in next row Sensitive      SENSITIVE<=1    CIPROFLOXACIN Value in next row Sensitive      SENSITIVE<=0.25    IMIPENEM Value in next row Sensitive      SENSITIVE2    NITROFURANTOIN Value in next  row Resistant      RESISTANT128    TRIMETH/SULFA Value in next row Sensitive      SENSITIVE<=20    PIP/TAZO Value in next row Sensitive      SENSITIVE<=4    AMPICILLIN/SULBACTAM Value in next row Sensitive      SENSITIVE<=2    * >=100,000 COLONIES/mL PROTEUS MIRABILIS  Culture, blood (routine x 2)     Status: None   Collection Time: 10/20/15  9:57 AM  Result Value Ref Range Status   Specimen Description BLOOD RIGHT WRIST  Final   Special Requests   Final    BOTTLES DRAWN AEROBIC AND ANAEROBIC ANA 1ML AER 4ML   Culture  Setup Time   Final    GRAM NEGATIVE RODS IN BOTH AEROBIC AND ANAEROBIC BOTTLES CRITICAL RESULT CALLED TO, READ BACK BY AND VERIFIED WITH: MATT MCBANE ON 10/21/15 AT 0005 [facility] CONFIRMED BY [place]    Culture   Final    PROTEUS MIRABILIS IN BOTH AEROBIC AND ANAEROBIC BOTTLES    Report Status 10/22/2015 FINAL  Final   Organism ID, Bacteria PROTEUS MIRABILIS  Final      Susceptibility   Proteus mirabilis - MIC*    AMPICILLIN/SULBACTAM Value in next row Sensitive      SENSITIVE<=2    PIP/TAZO Value in next row Sensitive      SENSITIVE<=4    CEFTRIAXONE Value in next row Sensitive      SENSITIVE<=1    IMIPENEM Value in next row Sensitive      SENSITIVE2    GENTAMICIN Value in next row Sensitive      SENSITIVE<=1    CIPROFLOXACIN Value in next row Sensitive      SENSITIVE<=0.25    * PROTEUS MIRABILIS  Culture, blood (routine x 2)     Status: None   Collection Time: 10/20/15  9:57 AM  Result Value Ref Range Status   Specimen Description BLOOD LEFT HAND  Final   Special Requests   Final    BOTTLES DRAWN AEROBIC AND ANAEROBIC AER 3ML ANA 2ML   Culture  Setup Time   Final    GRAM NEGATIVE RODS IN BOTH AEROBIC AND ANAEROBIC BOTTLES CRITICAL VALUE NOTED.  VALUE IS CONSISTENT WITH PREVIOUSLY REPORTED AND CALLED VALUE.    Culture   Final    PROTEUS MIRABILIS IN BOTH AEROBIC AND ANAEROBIC BOTTLES    Report Status 10/22/2015 FINAL  Final   Organism ID, Bacteria  PROTEUS MIRABILIS  Final      Susceptibility   Proteus mirabilis - MIC*    AMPICILLIN Value in next row Sensitive      SENSITIVE<=2    PIP/TAZO Value in next row Sensitive      SENSITIVE<=4    CEFTAZIDIME Value in next row Sensitive      SENSITIVE<=1    CEFTRIAXONE Value in next row Sensitive      SENSITIVE<=1    CEFEPIME Value in next row Sensitive      SENSITIVE<=1    IMIPENEM Value in next row Sensitive      SENSITIVE2    GENTAMICIN Value in next row Sensitive      SENSITIVE<=1    CIPROFLOXACIN Value in next row Sensitive      SENSITIVE<=0.25    * PROTEUS MIRABILIS  Blood Culture ID Panel (Reflexed)     Status: Abnormal   Collection Time: 10/20/15  9:57 AM  Result Value Ref Range Status   Enterococcus species NOT DETECTED NOT DETECTED Final   Listeria monocytogenes NOT DETECTED NOT DETECTED Final   Staphylococcus species NOT DETECTED NOT DETECTED Final   Staphylococcus aureus NOT DETECTED NOT DETECTED Final   Streptococcus species NOT DETECTED NOT DETECTED Final   Streptococcus agalactiae NOT DETECTED NOT DETECTED Final   Streptococcus pneumoniae NOT DETECTED NOT DETECTED Final   Streptococcus pyogenes NOT DETECTED NOT DETECTED Final   Acinetobacter baumannii NOT DETECTED NOT DETECTED Final   Enterobacteriaceae species DETECTED (A) NOT DETECTED Final    Comment: CRITICAL RESULT CALLED TO, READ BACK BY AND VERIFIED WITH: MATT MCBANE ON 10/21/15 AT 0005 [place]    Enterobacter cloacae complex NOT DETECTED NOT DETECTED Final   Escherichia coli NOT DETECTED NOT DETECTED Final   Klebsiella oxytoca NOT DETECTED NOT DETECTED Final   Klebsiella pneumoniae NOT DETECTED NOT DETECTED Final   Proteus species (A) NOT DETECTED Final    CRITICAL RESULT CALLED TO, READ BACK BY AND VERIFIED WITH:    Comment: MATT MCBANE ON 10/21/15 AT 0005 [place]   Serratia marcescens NOT DETECTED NOT DETECTED Final   Haemophilus influenzae NOT DETECTED NOT DETECTED Final   Neisseria meningitidis NOT DETECTED  NOT DETECTED Final   Pseudomonas aeruginosa NOT DETECTED NOT DETECTED Final   Candida albicans NOT DETECTED NOT DETECTED Final   Candida glabrata NOT DETECTED NOT DETECTED Final   Candida krusei NOT DETECTED NOT DETECTED Final   Candida parapsilosis NOT DETECTED NOT DETECTED Final   Candida tropicalis NOT DETECTED NOT DETECTED Final   Carbapenem resistance NOT DETECTED NOT DETECTED Final   Methicillin resistance NOT DETECTED NOT DETECTED Final   Vancomycin resistance NOT DETECTED NOT DETECTED Final  MRSA PCR Screening     Status: None   Collection Time: 10/20/15  7:41 PM  Result Value Ref Range Status   MRSA by PCR NEGATIVE NEGATIVE Final    Comment:        The GeneXpert MRSA Assay (FDA approved for NASAL specimens only), is one component of a comprehensive MRSA colonization surveillance program. It is not intended to diagnose MRSA infection nor to guide or monitor treatment for MRSA  infections.   CULTURE, BLOOD (ROUTINE X 2) w Reflex to PCR ID Panel     Status: None   Collection Time: 10/22/15 11:39 AM  Result Value Ref Range Status   Specimen Description BLOOD LEFT HAND  Final   Special Requests BOTTLES DRAWN AEROBIC AND ANAEROBIC 1CC  Final   Culture NO GROWTH 5 DAYS  Final   Report Status 10/27/2015 FINAL  Final  Culture, expectorated sputum-assessment     Status: None   Collection Time: 10/22/15 12:00 PM  Result Value Ref Range Status   Specimen Description SPUTUM  Final   Special Requests Normal  Final   Sputum evaluation THIS SPECIMEN IS ACCEPTABLE FOR SPUTUM CULTURE  Final   Report Status 10/22/2015 FINAL  Final  Culture, respiratory (NON-Expectorated)     Status: None   Collection Time: 10/22/15 12:00 PM  Result Value Ref Range Status   Specimen Description SPUTUM  Final   Special Requests Normal Reflexed from [id number]  Final   Gram Stain   Final    FAIR SPECIMEN - 70-80% WBCS FEW WBC SEEN RARE YEAST RARE GRAM NEGATIVE RODS    Culture LIGHT GROWTH ENTEROBACTER  AEROGENES  Final   Report Status 10/24/2015 FINAL  Final   Organism ID, Bacteria ENTEROBACTER AEROGENES  Final      Susceptibility   Enterobacter aerogenes - MIC*    CEFAZOLIN >=64 RESISTANT Resistant     CEFEPIME <=1 SENSITIVE Sensitive     CEFTAZIDIME <=1 SENSITIVE Sensitive     CEFTRIAXONE <=1 SENSITIVE Sensitive     CIPROFLOXACIN <=0.25 SENSITIVE Sensitive     GENTAMICIN <=1 SENSITIVE Sensitive     IMIPENEM 2 SENSITIVE Sensitive     TRIMETH/SULFA <=20 SENSITIVE Sensitive     PIP/TAZO <=4 SENSITIVE Sensitive     * LIGHT GROWTH ENTEROBACTER AEROGENES  Urine culture     Status: None   Collection Time: 10/22/15 12:45 PM  Result Value Ref Range Status   Specimen Description URINE, RANDOM  Final   Special Requests NONE  Final   Culture NO GROWTH 2 DAYS  Final   Report Status 10/24/2015 FINAL  Final    Coagulation Studies: No results for input(s): LABPROT, INR in the last 72 hours.  Urinalysis: No results for input(s): COLORURINE, LABSPEC, PHURINE, GLUCOSEU, HGBUR, BILIRUBINUR, KETONESUR, PROTEINUR, UROBILINOGEN, NITRITE, LEUKOCYTESUR in the last 72 hours.  Invalid input(s): APPERANCEUR    Imaging: Ct Abdomen Pelvis Wo Contrast  10/27/2015  CLINICAL DATA:  Sepsis. EXAM: CT ABDOMEN AND PELVIS WITHOUT CONTRAST TECHNIQUE: Multidetector CT imaging of the abdomen and pelvis was performed following the standard protocol without IV contrast. COMPARISON:  CT scan of October 20, 2015. 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 Mild bilateral pleural effusions are noted with adjacent atelectasis. No gallstones are noted. No focal abnormality is noted in the liver, spleen or pancreas on these unenhanced images. Nasogastric tube tip is seen in proximal stomach. Adrenal glands appear normal. Left kidney and ureter appear normal. Moderate right hydronephrosis is again noted with nonobstructive calculus seen in upper pole calyx. 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 Atherosclerosis of abdominal aorta is noted without aneurysm formation. There is no evidence of bowel obstruction. The appendix appears normal. There is no evidence of bowel obstruction. Urinary bladder is decompressed secondary to Foley catheter. Stable left ovarian cyst is noted. Uterus and right ovary appear normal. No significant adenopathy is noted. Mild anasarca is noted. IMPRESSION: Atherosclerosis of abdominal aorta without  aneurysm formation. Nonobstructive right renal calculus is again noted. 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 Mild anasarca is noted. Mild bilateral pleural effusions are noted with adjacent atelectasis. Electronically Signed   By: Marijo Conception, M.D.   On: 10/27/2015 17:09   Dg Chest 1 View  10/28/2015  CLINICAL DATA:  Dyspnea, sepsis, acute renal failure, respiratory failure, intubated patient. EXAM: CHEST 1 VIEW COMPARISON:  Portable chest x-ray of October 26, 2015 FINDINGS: The lungs are mildly hypoinflated today. The pulmonary interstitial markings remain increased bilaterally. The hemidiaphragms are now obscured bilaterally. The cardiac silhouette is poorly defined but appears mildly enlarged. The central pulmonary vascularity is prominent. There are probable bilateral pleural effusions. The endotracheal tube tip lies approximately 3 cm above the carina. The right internal jugular venous catheter tip projects over the midportion of the SVC. The left internal jugular venous catheter tip projects over the proximal SVC. The esophagogastric tube tip projects below the inferior margin of the image. IMPRESSION: Worsening of pulmonary interstitial edema and bilateral pleural effusions. This is likely secondary to CHF though bilateral interstitial pneumonia could produce similar findings. Electronically Signed   By: David  Martinique M.D.   On: 10/28/2015 07:35   Ct Head Wo  Contrast  10/27/2015  CLINICAL DATA:  Found lying on the floor minimally responsive EXAM: CT HEAD WITHOUT CONTRAST TECHNIQUE: Contiguous axial images were obtained from the base of the skull through the vertex without intravenous contrast. COMPARISON:  10/20/2015 FINDINGS: No acute cortical infarct, hemorrhage, or mass lesion ispresent. Ventricles are of normal size. No significant extra-axial fluid collection is present. The paranasal sinuses andmastoid air cells are clear. The osseous skull is intact. ET tube is identified. There is mild mucosal thickening involving the left maxillary sinus. The mastoid air cells are clear. The calvarium is intact. IMPRESSION: 1. No acute intracranial abnormalities identified. Electronically Signed   By: Kerby Moors M.D.   On: 10/27/2015 17:00     Medications:   . feeding supplement (VITAL AF 1.2 CAL) 1,000 mL (10/28/15 0600)  . fentaNYL infusion INTRAVENOUS 275 mcg/hr (10/28/15 0600)  . norepinephrine (LEVOPHED) Adult infusion Stopped (10/25/15 2259)  . pureflow 2,000 mL/hr at 10/22/15 0104  . vasopressin (PITRESSIN) infusion - *FOR SHOCK* Stopped (10/23/15 0950)   . amiodarone  400 mg Oral BID  . antiseptic oral rinse  7 mL Mouth Rinse 10 times per day  . ceFEPime (MAXIPIME) IV  1 g Intravenous Q12H  . cefTRIAXone (ROCEPHIN)  IV  2 g Intravenous Q24H  . chlorhexidine gluconate  15 mL Mouth Rinse BID  . feeding supplement (PRO-STAT SUGAR FREE 64)  30 mL Per Tube TID  . free water  100 mL Per Tube 3 times per day  . insulin aspart  0-15 Units Subcutaneous 6 times per day  . insulin detemir  13 Units Subcutaneous QHS  . ipratropium-albuterol  3 mL Nebulization Q6H  . oxyCODONE  5 mg Oral Q6H  . pantoprazole sodium  40 mg Per Tube Q24H  . potassium chloride  40 mEq Oral Daily  . senna-docusate  1 tablet Oral BID   acetaminophen **OR** acetaminophen, iohexol, LORazepam  Assessment/ Plan:  April [name] is a 70 y.o. white female with DM (A1c  7.7%),  who was admitted to [facility] on 10/20/2015  1. Acute renal failure with metabolic acidosis:  - Unknown baseline creatinine. Admit Cr 3.01 Required CRRT from 1/24 to 1/27. Then intermittent hemodialysis 1/28.  -  Acute renal failure from sepsis leading to ATN. Serologic testing negative 10/21/15 - Now nonoliguric with good urine output. >3500 - Electrolytes and Volume status are acceptable No acute indication for Dialysis at present   2. 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 Hypokalemia: post ATN diuresis.  - potassium being replaced. prn  4. 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   6. Hypernatremia - from diuresis - increase free water replacement  - currently 200 q 2 hrs (2400 cc free water in 24 hrs) - deficit 5.6 L   LOS: 8 Steven Veazie 2/1/20179:06 AM

## 2015-10-28 NOTE — Care Management (Signed)
No longer requiring dialysis- kidney function has recovered..  Temp up to 101 within last 24 hours. Remains on vent 02 down to 40% from 60.  Discussed wake up trials

## 2015-10-28 NOTE — Progress Notes (Signed)
[place] NOTE  Pharmacy Consult for Cefepime Dosing, Electrolyte Management, Constipation Prevention Indication: HD/ Proteus Bacteremia     Allergies  Allergen Reactions  . Prednisone Anaphylaxis    Patient Measurements: Height: '5\' 7"'$  (170.2 cm) Weight: 290 lb 5.5 oz (131.7 kg) IBW/kg (Calculated) : 61.6   Vital Signs: Temp: 101.5 F (38.6 C) (02/01 1100) Temp Source: Oral (02/01 0600) BP: 144/55 mmHg (02/01 1100) Pulse Rate: 91 (02/01 1100) Intake/Output from previous day: 01/31 0701 - 02/01 0700 In: 2030.3 [I.V.:640.3; NG/GT:1340; IV Piggyback:50] Out: 5409 [Urine:3700] Intake/Output from this shift: Total I/O In: 745 [I.V.:95; NG/GT:600; IV Piggyback:50] Out: -  Vent settings for last 24 hours: Vent Mode:  [-] PRVC FiO2 (%):  [40 %-50 %] 40 % Set Rate:  [25 bmp] 25 bmp Vt Set:  [550 mL] 550 mL PEEP:  [0 cmH20] 0 cmH20 Plateau Pressure:  [20 cmH20] 20 cmH20  Labs:  Recent Labs  10/26/15 0400 10/27/15 0433 10/28/15 0443  WBC 23.6* 27.6* 26.2*  HGB 10.7* 10.0* 9.7*  HCT 32.2* 30.4* 29.9*  PLT 59* 82* 109*  CREATININE 2.11* 1.80* 1.60*  MG 2.3  --  2.3  PHOS 4.5  --   --   ALBUMIN 1.7*  --   --   PROT 5.3*  --   --   AST 92*  --   --   ALT 174*  --   --   ALKPHOS 188*  --   --   BILITOT 2.1*  --   --    Estimated Creatinine Clearance: 46.9 mL/min (by C-G formula based on Cr of 1.6).   Recent Labs  10/28/15 0348 10/28/15 0742 10/28/15 1132  GLUCAP 176* 167* 207*    Microbiology: Recent Results (from the past 720 hour(s))  Urine culture     Status: None   Collection Time: 10/20/15  9:57 AM  Result Value Ref Range Status   Specimen Description URINE, RANDOM  Final   Special Requests NONE  Final   Culture >=100,000 COLONIES/mL PROTEUS MIRABILIS  Final   Report Status 10/22/2015 FINAL  Final   Organism ID, Bacteria PROTEUS MIRABILIS  Final      Susceptibility   Proteus mirabilis - MIC*    AMPICILLIN <=2 SENSITIVE  Sensitive     GENTAMICIN <=1 SENSITIVE Sensitive     CEFTRIAXONE Value in next row Sensitive      SENSITIVE<=1    CIPROFLOXACIN Value in next row Sensitive      SENSITIVE<=0.25    IMIPENEM Value in next row Sensitive      SENSITIVE2    NITROFURANTOIN Value in next row Resistant      RESISTANT128    TRIMETH/SULFA Value in next row Sensitive      SENSITIVE<=20    PIP/TAZO Value in next row Sensitive      SENSITIVE<=4    AMPICILLIN/SULBACTAM Value in next row Sensitive      SENSITIVE<=2    * >=100,000 COLONIES/mL PROTEUS MIRABILIS  Culture, blood (routine x 2)     Status: None   Collection Time: 10/20/15  9:57 AM  Result Value Ref Range Status   Specimen Description BLOOD RIGHT WRIST  Final   Special Requests   Final    BOTTLES DRAWN AEROBIC AND ANAEROBIC ANA 1ML AER 4ML   Culture  Setup Time   Final    GRAM NEGATIVE RODS IN BOTH AEROBIC AND ANAEROBIC BOTTLES CRITICAL RESULT CALLED TO, READ BACK BY AND VERIFIED WITH: MATT MCBANE  ON 10/21/15 AT 0005 [facility] CONFIRMED BY [facility]    Culture   Final    PROTEUS MIRABILIS IN BOTH AEROBIC AND ANAEROBIC BOTTLES    Report Status 10/22/2015 FINAL  Final   Organism ID, Bacteria PROTEUS MIRABILIS  Final      Susceptibility   Proteus mirabilis - MIC*    AMPICILLIN/SULBACTAM Value in next row Sensitive      SENSITIVE<=2    PIP/TAZO Value in next row Sensitive      SENSITIVE<=4    CEFTRIAXONE Value in next row Sensitive      SENSITIVE<=1    IMIPENEM Value in next row Sensitive      SENSITIVE2    GENTAMICIN Value in next row Sensitive      SENSITIVE<=1    CIPROFLOXACIN Value in next row Sensitive      SENSITIVE<=0.25    * PROTEUS MIRABILIS  Culture, blood (routine x 2)     Status: None   Collection Time: 10/20/15  9:57 AM  Result Value Ref Range Status   Specimen Description BLOOD LEFT HAND  Final   Special Requests   Final    BOTTLES DRAWN AEROBIC AND ANAEROBIC AER 3ML ANA 2ML   Culture  Setup Time   Final    GRAM NEGATIVE RODS IN BOTH  AEROBIC AND ANAEROBIC BOTTLES CRITICAL VALUE NOTED.  VALUE IS CONSISTENT WITH PREVIOUSLY REPORTED AND CALLED VALUE.    Culture   Final    PROTEUS MIRABILIS IN BOTH AEROBIC AND ANAEROBIC BOTTLES    Report Status 10/22/2015 FINAL  Final   Organism ID, Bacteria PROTEUS MIRABILIS  Final      Susceptibility   Proteus mirabilis - MIC*    AMPICILLIN Value in next row Sensitive      SENSITIVE<=2    PIP/TAZO Value in next row Sensitive      SENSITIVE<=4    CEFTAZIDIME Value in next row Sensitive      SENSITIVE<=1    CEFTRIAXONE Value in next row Sensitive      SENSITIVE<=1    CEFEPIME Value in next row Sensitive      SENSITIVE<=1    IMIPENEM Value in next row Sensitive      SENSITIVE2    GENTAMICIN Value in next row Sensitive      SENSITIVE<=1    CIPROFLOXACIN Value in next row Sensitive      SENSITIVE<=0.25    * PROTEUS MIRABILIS  Blood Culture ID Panel (Reflexed)     Status: Abnormal   Collection Time: 10/20/15  9:57 AM  Result Value Ref Range Status   Enterococcus species NOT DETECTED NOT DETECTED Final   Listeria monocytogenes NOT DETECTED NOT DETECTED Final   Staphylococcus species NOT DETECTED NOT DETECTED Final   Staphylococcus aureus NOT DETECTED NOT DETECTED Final   Streptococcus species NOT DETECTED NOT DETECTED Final   Streptococcus agalactiae NOT DETECTED NOT DETECTED Final   Streptococcus pneumoniae NOT DETECTED NOT DETECTED Final   Streptococcus pyogenes NOT DETECTED NOT DETECTED Final   Acinetobacter baumannii NOT DETECTED NOT DETECTED Final   Enterobacteriaceae species DETECTED (A) NOT DETECTED Final    Comment: CRITICAL RESULT CALLED TO, READ BACK BY AND VERIFIED WITH: MATT MCBANE ON 10/21/15 AT 0005 [place]    Enterobacter cloacae complex NOT DETECTED NOT DETECTED Final   Escherichia coli NOT DETECTED NOT DETECTED Final   Klebsiella oxytoca NOT DETECTED NOT DETECTED Final   Klebsiella pneumoniae NOT DETECTED NOT DETECTED Final   Proteus species (A) NOT DETECTED  Final  CRITICAL RESULT CALLED TO, READ BACK BY AND VERIFIED WITH:    Comment: MATT MCBANE ON 10/21/15 AT 0005 [place]   Serratia marcescens NOT DETECTED NOT DETECTED Final   Haemophilus influenzae NOT DETECTED NOT DETECTED Final   Neisseria meningitidis NOT DETECTED NOT DETECTED Final   Pseudomonas aeruginosa NOT DETECTED NOT DETECTED Final   Candida albicans NOT DETECTED NOT DETECTED Final   Candida glabrata NOT DETECTED NOT DETECTED Final   Candida krusei NOT DETECTED NOT DETECTED Final   Candida parapsilosis NOT DETECTED NOT DETECTED Final   Candida tropicalis NOT DETECTED NOT DETECTED Final   Carbapenem resistance NOT DETECTED NOT DETECTED Final   Methicillin resistance NOT DETECTED NOT DETECTED Final   Vancomycin resistance NOT DETECTED NOT DETECTED Final  MRSA PCR Screening     Status: None   Collection Time: 10/20/15  7:41 PM  Result Value Ref Range Status   MRSA by PCR NEGATIVE NEGATIVE Final    Comment:        The GeneXpert MRSA Assay (FDA approved for NASAL specimens only), is one component of a comprehensive MRSA colonization surveillance program. It is not intended to diagnose MRSA infection nor to guide or monitor treatment for MRSA infections.   CULTURE, BLOOD (ROUTINE X 2) w Reflex to PCR ID Panel     Status: None   Collection Time: 10/22/15 11:39 AM  Result Value Ref Range Status   Specimen Description BLOOD LEFT HAND  Final   Special Requests BOTTLES DRAWN AEROBIC AND ANAEROBIC 1CC  Final   Culture NO GROWTH 5 DAYS  Final   Report Status 10/27/2015 FINAL  Final  Culture, expectorated sputum-assessment     Status: None   Collection Time: 10/22/15 12:00 PM  Result Value Ref Range Status   Specimen Description SPUTUM  Final   Special Requests Normal  Final   Sputum evaluation THIS SPECIMEN IS ACCEPTABLE FOR SPUTUM CULTURE  Final   Report Status 10/22/2015 FINAL  Final  Culture, respiratory (NON-Expectorated)     Status: None   Collection Time: 10/22/15 12:00 PM   Result Value Ref Range Status   Specimen Description SPUTUM  Final   Special Requests Normal Reflexed from [id number]  Final   Gram Stain   Final    FAIR SPECIMEN - 70-80% WBCS FEW WBC SEEN RARE YEAST RARE GRAM NEGATIVE RODS    Culture LIGHT GROWTH ENTEROBACTER AEROGENES  Final   Report Status 10/24/2015 FINAL  Final   Organism ID, Bacteria ENTEROBACTER AEROGENES  Final      Susceptibility   Enterobacter aerogenes - MIC*    CEFAZOLIN >=64 RESISTANT Resistant     CEFEPIME <=1 SENSITIVE Sensitive     CEFTAZIDIME <=1 SENSITIVE Sensitive     CEFTRIAXONE <=1 SENSITIVE Sensitive     CIPROFLOXACIN <=0.25 SENSITIVE Sensitive     GENTAMICIN <=1 SENSITIVE Sensitive     IMIPENEM 2 SENSITIVE Sensitive     TRIMETH/SULFA <=20 SENSITIVE Sensitive     PIP/TAZO <=4 SENSITIVE Sensitive     * LIGHT GROWTH ENTEROBACTER AEROGENES  Urine culture     Status: None   Collection Time: 10/22/15 12:45 PM  Result Value Ref Range Status   Specimen Description URINE, RANDOM  Final   Special Requests NONE  Final   Culture NO GROWTH 2 DAYS  Final   Report Status 10/24/2015 FINAL  Final    Medications:  Scheduled:  . amiodarone  400 mg Oral BID  . antiseptic oral rinse  7 mL Mouth Rinse 10  times per day  . ceFEPime (MAXIPIME) IV  1 g Intravenous Q12H  . chlorhexidine gluconate  15 mL Mouth Rinse BID  . feeding supplement (PRO-STAT SUGAR FREE 64)  30 mL Per Tube TID  . free water  200 mL Per Tube Q2H  . insulin aspart  0-15 Units Subcutaneous 6 times per day  . insulin detemir  13 Units Subcutaneous QHS  . ipratropium-albuterol  3 mL Nebulization Q6H  . oxyCODONE  5 mg Oral Q6H  . pantoprazole sodium  40 mg Per Tube Q24H  . potassium chloride  40 mEq Oral Daily  . senna-docusate  1 tablet Oral BID   Infusions:  . feeding supplement (VITAL AF 1.2 CAL) 1,000 mL (10/28/15 0600)  . fentaNYL infusion INTRAVENOUS 125 mcg/hr (10/28/15 1105)  . norepinephrine (LEVOPHED) Adult infusion Stopped (10/25/15 2259)   . pureflow 2,000 mL/hr at 10/22/15 0104  . vasopressin (PITRESSIN) infusion - *FOR SHOCK* Stopped (10/23/15 4287)    Assessment: 70 y/o F with septic shock from Proteus UTI. Patient with ARF recently transitioned from CRRT to HD not currently requiring HD.    Plan:  1. Ceftriaxone changed to cefepime. Will adjust cefepime dosing to 2 g iv q 12 hours for renal function.   2.. Electrolytes: Electrolytes are wnl. Will recheck electrolytes with am labs.   3. Constipation: Patient with last BM 1/31. Will continue patient on senna/docusate 1 tab  BID.    Pharmacy will continue to monitor and adjust per consult.    Ulice Dash, PharmD Clinical Pharmacist   10/28/2015

## 2015-10-28 NOTE — Progress Notes (Signed)
[facility] [place] NAME: April [name]    MR#:  [id number]  DATE OF BIRTH:  1946/04/18  SUBJECTIVE:   Remains Critically ill. Plt count improving. Urine output stable.  CT abdomen pelvis showing non-obstructive calculus w/ hydronephrosis.  CT Head (-).  Had a fever of 70 last night.  Sputum Cx growing Enterobacter.   REVIEW OF SYSTEMS:   Review of Systems  Unable to perform ROS: intubated   Tolerating Diet: TF Tolerating PT: intubated  DRUG ALLERGIES:   Allergies  Allergen Reactions  . Prednisone Anaphylaxis    VITALS:  Blood pressure 136/53, pulse 83, temperature 101.7 F (38.7 C), temperature source Oral, resp. rate 25, height '5\' 7"'$  (1.702 m), weight 131.7 kg (290 lb 5.5 oz), SpO2 96 %.  PHYSICAL EXAMINATION:   Physical Exam   GENERAL:  70 y.o.-year-old patient lying in the bed critically ill, intubated and on the vent in no acute distress. EYES: Pupils equal, round, reactive to light. (-) scleral icterus.  HEENT: Head atraumatic, normocephalic. Oropharynx and nasopharynx clear. intubated and on the vent NECK:  Supple, no jugular venous distention. No thyroid enlargement, no tenderness.  LUNGS: distant breath sounds bilaterally, no wheezing, rales, rhonchi. No use of accessory muscles of respiration.  CARDIOVASCULAR: S1, S2 normal. No murmurs, rubs, or gallops.  ABDOMEN: Soft, nontender, nondistended. Bowel sounds present. No organomegaly or mass. + foley with yellow urine draining.  EXTREMITIES: Cyanotic toe's b/l.  Faint pulses b/l.   NEUROLOGIC: Sedated and on the ventilator but does not follow any simple commands. + Tremor/twitching noted at times.  PSYCHIATRIC: Difficult to assess given the fact that she is sedated and intubated.  SKIN: No rashes, lesions, masses.  LABORATORY PANEL:  CBC  Recent Labs Lab 10/28/15 0443  WBC 26.2*  HGB 9.7*  HCT 29.9*  PLT 109*    Chemistries   Recent Labs Lab  10/26/15 0400  10/28/15 0443  NA 145  < > 152*  K 3.2*  < > 3.9  CL 110  < > 119*  CO2 29  < > 29  GLUCOSE 232*  < > 209*  BUN 89*  < > 71*  CREATININE 2.11*  < > 1.60*  CALCIUM 7.5*  < > 7.7*  MG 2.3  --  2.3  AST 92*  --   --   ALT 174*  --   --   ALKPHOS 188*  --   --   BILITOT 2.1*  --   --   < > = values in this interval not displayed. Cardiac Enzymes  Recent Labs Lab 10/22/15 0106  TROPONINI 0.27*   RADIOLOGY:  Ct Abdomen Pelvis Wo Contrast  10/27/2015  CLINICAL DATA:  Sepsis. EXAM: CT ABDOMEN AND PELVIS WITHOUT CONTRAST TECHNIQUE: Multidetector CT imaging of the abdomen and pelvis was performed following the standard protocol without IV contrast. COMPARISON:  CT scan of October 20, 2015. 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 Mild bilateral pleural effusions are noted with adjacent atelectasis. No gallstones are noted. No focal abnormality is noted in the liver, spleen or pancreas on these unenhanced images. Nasogastric tube tip is seen in proximal stomach. Adrenal glands appear normal. Left kidney and ureter appear normal. Moderate right hydronephrosis is again noted with nonobstructive calculus seen in upper pole calyx. 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 Atherosclerosis of abdominal aorta is noted without aneurysm formation. There is no evidence of bowel obstruction.  The appendix appears normal. There is no evidence of bowel obstruction. Urinary bladder is decompressed secondary to Foley catheter. Stable left ovarian cyst is noted. Uterus and right ovary appear normal. No significant adenopathy is noted. Mild anasarca is noted. IMPRESSION: Atherosclerosis of abdominal aorta without aneurysm formation. Nonobstructive right renal calculus is again noted. 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 Mild anasarca is noted. Mild bilateral pleural  effusions are noted with adjacent atelectasis. Electronically Signed   By: Marijo Conception, M.D.   On: 10/27/2015 17:09   Dg Chest 1 View  10/28/2015  CLINICAL DATA:  Dyspnea, sepsis, acute renal failure, respiratory failure, intubated patient. EXAM: CHEST 1 VIEW COMPARISON:  Portable chest x-ray of October 26, 2015 FINDINGS: The lungs are mildly hypoinflated today. The pulmonary interstitial markings remain increased bilaterally. The hemidiaphragms are now obscured bilaterally. The cardiac silhouette is poorly defined but appears mildly enlarged. The central pulmonary vascularity is prominent. There are probable bilateral pleural effusions. The endotracheal tube tip lies approximately 3 cm above the carina. The right internal jugular venous catheter tip projects over the midportion of the SVC. The left internal jugular venous catheter tip projects over the proximal SVC. The esophagogastric tube tip projects below the inferior margin of the image. IMPRESSION: Worsening of pulmonary interstitial edema and bilateral pleural effusions. This is likely secondary to CHF though bilateral interstitial pneumonia could produce similar findings. Electronically Signed   By: David  Martinique M.D.   On: 10/28/2015 07:35   Ct Head Wo Contrast  10/27/2015  CLINICAL DATA:  Found lying on the floor minimally responsive EXAM: CT HEAD WITHOUT CONTRAST TECHNIQUE: Contiguous axial images were obtained from the base of the skull through the vertex without intravenous contrast. COMPARISON:  10/20/2015 FINDINGS: No acute cortical infarct, hemorrhage, or mass lesion ispresent. Ventricles are of normal size. No significant extra-axial fluid collection is present. The paranasal sinuses andmastoid air cells are clear. The osseous skull is intact. ET tube is identified. There is mild mucosal thickening involving the left maxillary sinus. The mastoid air cells are clear. The calvarium is intact. IMPRESSION: 1. No acute intracranial  abnormalities identified. Electronically Signed   By: Kerby Moors M.D.   On: 10/27/2015 17:00   ASSESSMENT AND PLAN:   70 year old female presented to the emergency room after she was found lying on the floor with minimal responsiveness.  1. Severe Septic shock, hypovolemic shock: due to proteus bacteremia, from UTI - off vasopressors now but spiked a fever of 101 yesterday.  Sputum Cx + for Enterobacter.   - now on IV Cefepime and will cont.  Follow fever curve and hemodynamics.  -  blood and urine culture growing Proteus Mirabilis.    2. Acute renal failure: - is due to ATN in the setting of sepsis and rhabdomyolysis.  - was on CRRT but it was stopped due to thrombocytopenia.  Had short course of HD 1/28 - urine output is improving and > 3.7 L in the past 24 hrs.  - no acute indication for HD and cont. Care as per Nephro.  - pt. Was noted to have RIGHT hydronephrosis question UPJ obstruction on CT Scan abd/pelvis on admission and repeat CT abd/pelvis is showing the same.    3. Severe leukocytosis: This is due to sepsis and will cont. To monitor.   4. Atrial fibrillation with RVR - rate controlled now.  Off amio gtt and cont. Oral amio.  - Echo showing normal LV function  w/ EF of 55%.    5. DM-2 , new onset - HgA1c of 7.7 - No further episodes of hypoglycemia. Continue insulin every 4 hours and also sliding scale insulin for now. BS stable.   6. Elevated transaminases and jaundice - due to Shock liver.  LFT's improving and will cont. To monitor.   7. Acute rhabdomyolysis secondary to patient being found down on the floor:  - CPK's have trended down. Resolved.   8. Thrombocytopenia - came in with 62---46--23--12--14--> 16-->36-->59-->82-->109 today.  - likely consumption of plts given severe sepsis ,?DIC, ? Related to CRRT which has been stopped.  - no active bleeding and cont. To follow Plt count.   9. Hypokalemia -  Resolved w/ supplementation.   10. Tremor/Twitching -  etiology unclear.  Pt. Was down for prolonged period of time prior to admission but did not have cardiac arrest.  CT head on admission (-) and repeated 1/31 and it is (-) again.   - consider Neuro consult.   ?? Need for Palliative Care for goals of care and will consult them.   She remains critically ill with multiorgan failure.  CODE STATUS: full  DVT Prophylaxis: SCD/TED  TOTAL critical TIME TAKING CARE OF THIS PATIENT: 30 minutes.   Note: This dictation was prepared with Dragon dictation along with smaller phrase technology. Any transcriptional errors that result from this process are unintentional.  Henreitta Leber M.D on 10/28/2015 at 3:57 PM  Between 7am to 6pm - Pager - [phone]  After 6pm go to [email or web address] - password [email or web address] [facility]  Office  [phone]  CC: Primary care physician; Albina Billet, MD

## 2015-10-29 ENCOUNTER — Inpatient Hospital Stay: Payer: Medicare HMO

## 2015-10-29 DIAGNOSIS — D72829 Elevated white blood cell count, unspecified: Secondary | ICD-10-CM

## 2015-10-29 DIAGNOSIS — E872 Acidosis: Secondary | ICD-10-CM

## 2015-10-29 DIAGNOSIS — D696 Thrombocytopenia, unspecified: Secondary | ICD-10-CM

## 2015-10-29 DIAGNOSIS — J9600 Acute respiratory failure, unspecified whether with hypoxia or hypercapnia: Secondary | ICD-10-CM

## 2015-10-29 DIAGNOSIS — R748 Abnormal levels of other serum enzymes: Secondary | ICD-10-CM

## 2015-10-29 DIAGNOSIS — A4159 Other Gram-negative sepsis: Secondary | ICD-10-CM | POA: Diagnosis not present

## 2015-10-29 DIAGNOSIS — Z515 Encounter for palliative care: Secondary | ICD-10-CM

## 2015-10-29 LAB — CBC
HCT: 29 % — ABNORMAL LOW (ref 35.0–47.0)
Hemoglobin: 9.4 g/dL — ABNORMAL LOW (ref 12.0–16.0)
MCH: 29.8 pg (ref 26.0–34.0)
MCHC: 32.4 g/dL (ref 32.0–36.0)
MCV: 92.1 fL (ref 80.0–100.0)
PLATELETS: 132 10*3/uL — AB (ref 150–440)
RBC: 3.15 MIL/uL — ABNORMAL LOW (ref 3.80–5.20)
RDW: 14.7 % — AB (ref 11.5–14.5)
WBC: 31 10*3/uL — AB (ref 3.6–11.0)

## 2015-10-29 LAB — BASIC METABOLIC PANEL
ANION GAP: 1 — AB (ref 5–15)
ANION GAP: 3 — AB (ref 5–15)
BUN: 61 mg/dL — ABNORMAL HIGH (ref 6–20)
BUN: 59 mg/dL — AB (ref 6–20)
CALCIUM: 7.8 mg/dL — AB (ref 8.9–10.3)
CHLORIDE: 121 mmol/L — AB (ref 101–111)
CO2: 30 mmol/L (ref 22–32)
CO2: 31 mmol/L (ref 22–32)
CREATININE: 1.4 mg/dL — AB (ref 0.44–1.00)
CREATININE: 1.37 mg/dL — AB (ref 0.44–1.00)
Calcium: 8 mg/dL — ABNORMAL LOW (ref 8.9–10.3)
Chloride: 123 mmol/L — ABNORMAL HIGH (ref 101–111)
GFR calc non Af Amer: 38 mL/min — ABNORMAL LOW (ref >60)
GFR, EST AFRICAN AMERICAN: 43 mL/min — AB (ref >60)
GFR, EST AFRICAN AMERICAN: 44 mL/min — AB (ref >60)
GFR, EST NON AFRICAN AMERICAN: 37 mL/min — AB (ref >60)
GLUCOSE: 212 mg/dL — AB (ref 65–99)
Glucose, Bld: 211 mg/dL — ABNORMAL HIGH (ref 65–99)
Potassium: 4.1 mmol/L (ref 3.5–5.1)
Potassium: 4.8 mmol/L (ref 3.5–5.1)
SODIUM: 154 mmol/L — AB (ref 135–145)
SODIUM: 155 mmol/L — AB (ref 135–145)

## 2015-10-29 LAB — BLOOD GAS, ARTERIAL
ACID-BASE EXCESS: 6.7 mmol/L — AB (ref 0.0–3.0)
ALLENS TEST (PASS/FAIL): POSITIVE — AB
Acid-Base Excess: 4.3 mmol/L — ABNORMAL HIGH (ref 0.0–3.0)
Allens test (pass/fail): POSITIVE — AB
BICARBONATE: 31.9 meq/L — AB (ref 21.0–28.0)
BICARBONATE: 31.3 meq/L — AB (ref 21.0–28.0)
FIO2: 0.4
FIO2: 0.4
O2 Saturation: 96.2 %
O2 Saturation: 92.7 %
PEEP/CPAP: 5 cmH2O
PH ART: 7.43 (ref 7.350–7.450)
PH ART: 7.34 — AB (ref 7.350–7.450)
Patient temperature: 37
Patient temperature: 37
RATE: 18 resp/min
VT: 550 mL
pCO2 arterial: 48 mmHg (ref 32.0–48.0)
pCO2 arterial: 58 mmHg — ABNORMAL HIGH (ref 32.0–48.0)
pO2, Arterial: 81 mmHg — ABNORMAL LOW (ref 83.0–108.0)
pO2, Arterial: 70 mmHg — ABNORMAL LOW (ref 83.0–108.0)

## 2015-10-29 LAB — GLUCOSE, CAPILLARY
GLUCOSE-CAPILLARY: 170 mg/dL — AB (ref 65–99)
GLUCOSE-CAPILLARY: 152 mg/dL — AB (ref 65–99)
GLUCOSE-CAPILLARY: 164 mg/dL — AB (ref 65–99)
GLUCOSE-CAPILLARY: 168 mg/dL — AB (ref 65–99)
GLUCOSE-CAPILLARY: 145 mg/dL — AB (ref 65–99)

## 2015-10-29 MED ORDER — FENTANYL CITRATE (PF) 100 MCG/2ML IJ SOLN: 25.0000 ug | INTRAMUSCULAR | Status: DC | PRN

## 2015-10-29 MED ORDER — SODIUM CHLORIDE 0.9% FLUSH: 10.0000 mL | INTRAVENOUS | Status: DC | PRN

## 2015-10-29 MED ORDER — FUROSEMIDE 10 MG/ML IJ SOLN
60.0000 mg | Freq: Once | INTRAMUSCULAR | Status: AC
  Administered 2015-10-29: 60 mg via INTRAVENOUS
  Filled 2015-10-29: qty 6

## 2015-10-29 NOTE — Progress Notes (Signed)
[facility] Progess Note    ASSESSMENT/PLAN   Severe septic shock due to UTI Proteus/sepsis.   PULMONARY OETT 7.41m Respiratory failure - hypoxic Septic shock -slowly improving Reviewed CT and chest x-ray images reports, appears to be his suggestive of bibasilar atelectasis with small bibasilar effusions, possibility of pneumonia, particularly in light of continued fevers.  P:  ABG this a.m. 7.43/48/81/31.9. Appears to be adequately compensated. Vent settings reviewed, PRVC, rate 18, volume 550, PEEP 5. -Weaning trial this morning. Extubated if tolerated. -Chest x-ray 10/29/15. Images reviewed, continues to show bibasilar atelectasis.  DVT prophylaxis with Lovenox.  INFECTIOUS Sepsis - Proteus UTI and Bacteremia Still spiking fevers on antibiotics, MAXIMUM TEMPERATURE of 101 yesterday at 10 PM P:  - cont with current abx   Ceftriaxone 10/26/2015>> February 1 Cefepime February 1 17>>   Sputum culture 10/22/2015 positive for Enterobacter. Urine culture 10/22/2015 negative thus far. Blood culture 10/22/2015: Negative. Urine culture 10/20/2015: Positive for Proteus mirabilis..Marland KitchenMRSA screen negative  CARDIOVASCULAR CVL - RIJ VASCATH, LIJ CVL Shock - septic, hypotension, improved, patient has been weaned off of pressors. NSTEMI Afib - started on amiodarone 1/24  P:  - maintain MAP >65 - vasopressors as needed - currently weaned off.  - monitor BP - trend CE - heparin gtt on hold due to low plt - mostly likely demand ischemia - EKG reviewed, no sig ST changes.  - cont with amiodarone - cardiology consult appreciated.   RENAL  Status post ARF-kidney function continues to improve, with decreasing creatinine and good urine output. UTI Mild hypernatremia, nephro following Right hydronephrosis Proteus Bacteremia, Proteus UTI, CT abdomen reviewed-no evidence of abdominal abscess. P:  -monitor Cr, urine output and creatinine  improving  GASTROINTESTINAL Tube feeds with vital- PPI Elevated LFTs - trend   HEMATOLOGIC Leukocytosis Thrombocytopenia  P:  -Heparin was held, platelets increased to 132 today.    ENDOCRINE ICU hypo/hyperglycemia protocol -Blood glucose improved this morning, currently on 13 units of Levemir, and moderate dose correction.  NEUROLOGIC A: Acute encephalopathy P:  RASS goal: -1 - metabolic encephalopathy, improved this morning.  - cont to monitor neuro status.     ---------------------------------------   ----------------------------------------   Name: KBRITTIE WHISNANTMRN: [id number]: 1October 04, 1947   ADMISSION DATE:  10/20/2015    CHIEF COMPLAINT:  dyspnea    SUBJECTIVE:   Pt currently on the ventilator, can not provide history or review of systems.    VITAL SIGNS: Temp:  [99.5 F (37.5 C)-101.8 F (38.8 C)] 99.5 F (37.5 C) (02/02 0822) Pulse Rate:  [79-102] 83 (02/02 0822) Resp:  [16-25] 21 (02/02 0822) BP: (81-157)/(42-61) 134/61 mmHg (02/02 0822) SpO2:  [93 %-100 %] 99 % (02/02 0822) FiO2 (%):  [40 %] 40 % (02/02 0301) Weight:  [278 lb 7.1 oz (126.3 kg)] 278 lb 7.1 oz (126.3 kg) (02/02 0536) HEMODYNAMICS:   VENTILATOR SETTINGS: Vent Mode:  [-] PRVC FiO2 (%):  [40 %] 40 % Set Rate:  [18 bmp-25 bmp] 18 bmp Vt Set:  [550 mL] 550 mL PEEP:  [0 cmH20-5 cmH20] 5 cmH20 Plateau Pressure:  [18 cmH20-20 cmH20] 18 cmH20 INTAKE / OUTPUT:  Intake/Output Summary (Last 24 hours) at 10/29/15 0844 Last data filed at 10/29/15 0536  Gross per 24 hour  Intake 2908.96 ml  Output   3100 ml  Net -191.04 ml    PHYSICAL EXAMINATION: Physical Examination:   VS: BP 134/61 mmHg  Pulse 83  Temp(Src) 99.5 F (37.5 C) (Oral)  Resp 21  Ht '5\' 7"'$  (1.702 m)  Wt 278 lb 7.1 oz (126.3 kg)  BMI 43.60 kg/m2  SpO2 99%  General Appearance: No distress  Neuro:without focal findings,  HEENT: PERRLA, EOM intact. Pulmonary: normal breath sounds    CardiovascularNormal S1,S2.  No m/r/g.   Abdomen: Benign, Soft, non-tender. Renal:  No costovertebral tenderness  GU:  Not performed at this time. Endocrine: No evident thyromegaly. Skin:   warm, no rashes, no ecchymosis  Extremities: normal, no cyanosis, clubbing.   LABS:   LABORATORY PANEL:   CBC  Recent Labs Lab 10/29/15 0421  WBC 31.0*  HGB 9.4*  HCT 29.0*  PLT 132*    Chemistries   Recent Labs Lab 10/26/15 0400  10/28/15 0443 10/29/15 0421  NA 145  < > 152* 154*  K 3.2*  < > 3.9 4.1  CL 110  < > 119* 123*  CO2 29  < > 29 30  GLUCOSE 232*  < > 209* 211*  BUN 89*  < > 71* 61*  CREATININE 2.11*  < > 1.60* 1.40*  CALCIUM 7.5*  < > 7.7* 7.8*  MG 2.3  --  2.3  --   PHOS 4.5  --   --   --   AST 92*  --   --   --   ALT 174*  --   --   --   ALKPHOS 188*  --   --   --   BILITOT 2.1*  --   --   --   < > = values in this interval not displayed.   Recent Labs Lab 10/28/15 1132 10/28/15 1703 10/28/15 2014 10/28/15 2347 10/29/15 0413 10/29/15 0758  GLUCAP 207* 165* 220* 209* 170* 152*    Recent Labs Lab 10/26/15 0401 10/28/15 0437 10/29/15 0350  PHART 7.49* 7.45 7.43  PCO2ART 36 44 48  PO2ART 72* 71* 81*    Recent Labs Lab 10/23/15 0449 10/24/15 0444 10/26/15 0400  AST  --   --  92*  ALT  --   --  174*  ALKPHOS  --   --  188*  BILITOT  --   --  2.1*  ALBUMIN 1.7* 1.7* 1.7*    Cardiac Enzymes No results for input(s): TROPONINI in the last 168 hours.  RADIOLOGY:  Ct Abdomen Pelvis Wo Contrast  10/27/2015  CLINICAL DATA:  Sepsis. EXAM: CT ABDOMEN AND PELVIS WITHOUT CONTRAST TECHNIQUE: Multidetector CT imaging of the abdomen and pelvis was performed following the standard protocol without IV contrast. COMPARISON:  CT scan of October 20, 2015. 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 Mild bilateral pleural effusions are noted with adjacent atelectasis. No gallstones are noted. No focal abnormality is noted in the liver,  spleen or pancreas on these unenhanced images. Nasogastric tube tip is seen in proximal stomach. Adrenal glands appear normal. Left kidney and ureter appear normal. Moderate right hydronephrosis is again noted with nonobstructive calculus seen in upper pole calyx. 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 Atherosclerosis of abdominal aorta is noted without aneurysm formation. There is no evidence of bowel obstruction. The appendix appears normal. There is no evidence of bowel obstruction. Urinary bladder is decompressed secondary to Foley catheter. Stable left ovarian cyst is noted. Uterus and right ovary appear normal. No significant adenopathy is noted. Mild anasarca is noted. IMPRESSION: Atherosclerosis of abdominal aorta without aneurysm formation. Nonobstructive right renal calculus is again noted. 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 Mild anasarca is noted. Mild bilateral pleural effusions are noted with adjacent atelectasis. Electronically Signed   By: Marijo Conception, M.D.   On: 10/27/2015 17:09   Dg Chest 1 View  10/29/2015  CLINICAL DATA:  Dyspnea EXAM: CHEST 1 VIEW COMPARISON:  10/28/2015 FINDINGS: Endotracheal tube in good position. Right jugular central venous catheter tip at the cavoatrial atrial junction. Left jugular central venous catheter tip at the cavoatrial junction. NG tube in place with tip not visualized due to underpenetration Diffuse bilateral airspace disease is unchanged. Slightly improved lung volume. Bibasilar atelectasis and small effusions. IMPRESSION: Support lines remain in good position Diffuse bilateral airspace disease unchanged, most consistent with pulmonary edema. Slightly improved lung volume. Electronically Signed   By: Franchot Gallo M.D.   On: 10/29/2015 07:32   Dg Chest 1 View  10/28/2015  CLINICAL DATA:  Dyspnea, sepsis, acute renal failure,  respiratory failure, intubated patient. EXAM: CHEST 1 VIEW COMPARISON:  Portable chest x-ray of October 26, 2015 FINDINGS: The lungs are mildly hypoinflated today. The pulmonary interstitial markings remain increased bilaterally. The hemidiaphragms are now obscured bilaterally. The cardiac silhouette is poorly defined but appears mildly enlarged. The central pulmonary vascularity is prominent. There are probable bilateral pleural effusions. The endotracheal tube tip lies approximately 3 cm above the carina. The right internal jugular venous catheter tip projects over the midportion of the SVC. The left internal jugular venous catheter tip projects over the proximal SVC. The esophagogastric tube tip projects below the inferior margin of the image. IMPRESSION: Worsening of pulmonary interstitial edema and bilateral pleural effusions. This is likely secondary to CHF though bilateral interstitial pneumonia could produce similar findings. Electronically Signed   By: David  Martinique M.D.   On: 10/28/2015 07:35   Ct Head Wo Contrast  10/27/2015  CLINICAL DATA:  Found lying on the floor minimally responsive EXAM: CT HEAD WITHOUT CONTRAST TECHNIQUE: Contiguous axial images were obtained from the base of the skull through the vertex without intravenous contrast. COMPARISON:  10/20/2015 FINDINGS: No acute cortical infarct, hemorrhage, or mass lesion ispresent. Ventricles are of normal size. No significant extra-axial fluid collection is present. The paranasal sinuses andmastoid air cells are clear. The osseous skull is intact. ET tube is identified. There is mild mucosal thickening involving the left maxillary sinus. The mastoid air cells are clear. The calvarium is intact. IMPRESSION: 1. No acute intracranial abnormalities identified. Electronically Signed   By: Kerby Moors M.D.   On: 10/27/2015 17:00       --Marda Stalker, MD.  Pager [phone] [facility] Office Number: [phone]  [phone]  Patricia Pesa, M.D.  Vilinda Boehringer, M.D.  Merton Border, M.D  [place].  I have personally obtained a history, examined the patient, evaluated laboratory and imaging results, formulated the assessment and plan and placed orders. The Patient requires high complexity decision making for assessment and support, frequent evaluation and titration of therapies, application of advanced monitoring technologies and extensive interpretation of multiple databases. The patient has critical illness that could lead imminently to failure of 1 or more organ systems and requires the highest level of physician preparedness to intervene.  Critical Care Time devoted to patient care services described in this note is 35 minutes and is exclusive of time spent in procedures.

## 2015-10-29 NOTE — Progress Notes (Signed)
[name] NAME: April [name]    MR#:  [id number]  DATE OF BIRTH:  02/20/1946  SUBJECTIVE:   Following some simple commands today. Remains afebrile overnight. Currently on a spontaneous breathing trial and attempt to possibly extubate today.   REVIEW OF SYSTEMS:   Review of Systems  Unable to perform ROS: intubated   Tolerating Diet: TF Tolerating PT: intubated  DRUG ALLERGIES:   Allergies  Allergen Reactions  . Prednisone Anaphylaxis    VITALS:  Blood pressure 135/46, pulse 87, temperature 99.9 F (37.7 C), temperature source Core (Comment), resp. rate 23, height '5\' 7"'$  (1.702 m), weight 126.3 kg (278 lb 7.1 oz), SpO2 98 %.  PHYSICAL EXAMINATION:   Physical Exam   GENERAL:  70 y.o.-year-old patient lying in the bed critically ill, intubated and on the vent in no acute distress. EYES: Pupils equal, round, reactive to light. (-) scleral icterus.  HEENT: Head atraumatic, normocephalic. Oropharynx and nasopharynx clear. intubated and on the vent NECK:  Supple, no jugular venous distention. No thyroid enlargement, no tenderness.  LUNGS: distant breath sounds bilaterally, no wheezing, rales, rhonchi. No use of accessory muscles of respiration.  CARDIOVASCULAR: S1, S2 normal. No murmurs, rubs, or gallops.  ABDOMEN: Soft, nontender, nondistended. Bowel sounds present. No organomegaly or mass. + foley with yellow urine draining.  EXTREMITIES: Cyanotic toe's b/l.  Faint pulses b/l.   NEUROLOGIC: Sedated and on the ventilator but does follow some simple commands. PSYCHIATRIC: Difficult to assess given the fact that she intubated.  SKIN: No rashes, lesions, masses.  LABORATORY PANEL:  CBC  Recent Labs Lab 10/29/15 0421  WBC 31.0*  HGB 9.4*  HCT 29.0*  PLT 132*    Chemistries   Recent Labs Lab 10/26/15 0400  10/28/15 0443  10/29/15 1437  NA 145  < > 152*  < > 155*  K 3.2*  < > 3.9  < > 4.8  CL 110  < > 119*   < > 121*  CO2 29  < > 29  < > 31  GLUCOSE 232*  < > 209*  < > 212*  BUN 89*  < > 71*  < > 59*  CREATININE 2.11*  < > 1.60*  < > 1.37*  CALCIUM 7.5*  < > 7.7*  < > 8.0*  MG 2.3  --  2.3  --   --   AST 92*  --   --   --   --   ALT 174*  --   --   --   --   ALKPHOS 188*  --   --   --   --   BILITOT 2.1*  --   --   --   --   < > = values in this interval not displayed. Cardiac Enzymes No results for input(s): TROPONINI in the last 168 hours. RADIOLOGY:  Ct Abdomen Pelvis Wo Contrast  10/27/2015  CLINICAL DATA:  Sepsis. EXAM: CT ABDOMEN AND PELVIS WITHOUT CONTRAST TECHNIQUE: Multidetector CT imaging of the abdomen and pelvis was performed following the standard protocol without IV contrast. COMPARISON:  CT scan of October 20, 2015. 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 Mild bilateral pleural effusions are noted with adjacent atelectasis. No gallstones are noted. No focal abnormality is noted in the liver, spleen or pancreas on these unenhanced images. Nasogastric tube tip is seen in proximal stomach. Adrenal glands appear normal. Left kidney and ureter appear normal.  Moderate right hydronephrosis is again noted with nonobstructive calculus seen in upper pole calyx. 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 Atherosclerosis of abdominal aorta is noted without aneurysm formation. There is no evidence of bowel obstruction. The appendix appears normal. There is no evidence of bowel obstruction. Urinary bladder is decompressed secondary to Foley catheter. Stable left ovarian cyst is noted. Uterus and right ovary appear normal. No significant adenopathy is noted. Mild anasarca is noted. IMPRESSION: Atherosclerosis of abdominal aorta without aneurysm formation. Nonobstructive right renal calculus is again noted. 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 Mild anasarca is noted.  Mild bilateral pleural effusions are noted with adjacent atelectasis. Electronically Signed   By: Marijo Conception, M.D.   On: 10/27/2015 17:09   Dg Chest 1 View  10/29/2015  CLINICAL DATA:  Dyspnea EXAM: CHEST 1 VIEW COMPARISON:  10/28/2015 FINDINGS: Endotracheal tube in good position. Right jugular central venous catheter tip at the cavoatrial atrial junction. Left jugular central venous catheter tip at the cavoatrial junction. NG tube in place with tip not visualized due to underpenetration Diffuse bilateral airspace disease is unchanged. Slightly improved lung volume. Bibasilar atelectasis and small effusions. IMPRESSION: Support lines remain in good position Diffuse bilateral airspace disease unchanged, most consistent with pulmonary edema. Slightly improved lung volume. Electronically Signed   By: Franchot Gallo M.D.   On: 10/29/2015 07:32   Dg Chest 1 View  10/28/2015  CLINICAL DATA:  Dyspnea, sepsis, acute renal failure, respiratory failure, intubated patient. EXAM: CHEST 1 VIEW COMPARISON:  Portable chest x-ray of October 26, 2015 FINDINGS: The lungs are mildly hypoinflated today. The pulmonary interstitial markings remain increased bilaterally. The hemidiaphragms are now obscured bilaterally. The cardiac silhouette is poorly defined but appears mildly enlarged. The central pulmonary vascularity is prominent. There are probable bilateral pleural effusions. The endotracheal tube tip lies approximately 3 cm above the carina. The right internal jugular venous catheter tip projects over the midportion of the SVC. The left internal jugular venous catheter tip projects over the proximal SVC. The esophagogastric tube tip projects below the inferior margin of the image. IMPRESSION: Worsening of pulmonary interstitial edema and bilateral pleural effusions. This is likely secondary to CHF though bilateral interstitial pneumonia could produce similar findings. Electronically Signed   By: David  Martinique M.D.   On:  10/28/2015 07:35   Ct Head Wo Contrast  10/27/2015  CLINICAL DATA:  Found lying on the floor minimally responsive EXAM: CT HEAD WITHOUT CONTRAST TECHNIQUE: Contiguous axial images were obtained from the base of the skull through the vertex without intravenous contrast. COMPARISON:  10/20/2015 FINDINGS: No acute cortical infarct, hemorrhage, or mass lesion ispresent. Ventricles are of normal size. No significant extra-axial fluid collection is present. The paranasal sinuses andmastoid air cells are clear. The osseous skull is intact. ET tube is identified. There is mild mucosal thickening involving the left maxillary sinus. The mastoid air cells are clear. The calvarium is intact. IMPRESSION: 1. No acute intracranial abnormalities identified. Electronically Signed   By: Kerby Moors M.D.   On: 10/27/2015 17:00   ASSESSMENT AND PLAN:   70 year old female presented to the emergency room after she was found lying on the floor with minimal responsiveness.  1. Severe Septic shock, hypovolemic shock: due to proteus bacteremia, from UTI - off vasopressors now but spiked a fever of 101 1/31.  Sputum Cx + for Enterobacter.   - cont. IV Cefepime and will cont.  No  fever overnight and will follow fever curve.  -  blood and urine culture growing Proteus Mirabilis.    2. Acute renal failure: - is due to ATN in the setting of sepsis and rhabdomyolysis.  - was on CRRT but it was stopped due to thrombocytopenia.  Had short course of HD 1/28 - urine output is improving 3.1 L in the past 24 hrs.  - no acute indication for HD and cont. Care as per Nephro.  - pt. Was noted to have RIGHT hydronephrosis question UPJ obstruction on CT Scan abd/pelvis on admission and repeat CT abd/pelvis 1/31 showing the same. No acute intervention presently.    3. Severe leukocytosis: Likely due to underlying sepsis and will cont. To monitor.  - a bit worse than yesterday.   4. Atrial fibrillation with RVR - rate controlled now.   Off amio gtt and cont. Oral amio.  - Echo showing normal LV function w/ EF of 55%.    5. DM-2 , new onset - HgA1c of 7.7 - No further episodes of hypoglycemia. Continue insulin every 4 hours and also sliding scale insulin for now. BS stable.   6. Elevated transaminases and jaundice - due to Shock liver.  LFT's improving and will cont. To monitor.   7. Acute rhabdomyolysis secondary to patient being found down on the floor:  - CPK's have trended down. Resolved.   8. 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 Was + but improving.  No acute bleeding.  - came in with 62---46--23--12--14--> 16-->36-->59-->82-->109--> 132 today.   9. Hypokalemia -  Resolved w/ supplementation.   10. AMS/twitching - etiology unclear.  Pt. Was down for prolonged period of time prior to admission but did not have cardiac arrest.  CT head on admission (-) and repeated 1/31 and it is (-) again.   - consult Neuro in a.m.   11. Hypernatremia - cont. Free water flushes.  Not improved.  - as per Nephro 5 L deficit and have increased flushes for now and will monitor.   Seen by Palliative Care and they will cont. To follow but first await Neuro input.   CODE STATUS: full  DVT Prophylaxis: SCD/TED  TOTAL critical TIME TAKING CARE OF THIS PATIENT: 30 minutes.   Note: This dictation was prepared with Dragon dictation along with smaller phrase technology. Any transcriptional errors that result from this process are unintentional.  Henreitta Leber M.D on 10/29/2015 at 4:27 PM  Between 7am to 6pm - Pager - [phone]  After 6pm go to [email or web address] - password [email or web address] [facility]  Office  [phone]  CC: Primary care physician; Albina Billet, MD

## 2015-10-29 NOTE — Progress Notes (Signed)
Dr. Juanell Fairly notified of pt's increased work of breathing. O2 sats remain in the high 90's , at 95 currently.  New orders given for pt to be placed on Bipap. Will continue to assess.

## 2015-10-29 NOTE — Progress Notes (Signed)
[facility]  ROUNDING NOTE   Subjective:   Last HD was Saturday Now, UOP > 3100 Vent assisted   Eyes are open. Able to follow simple commands Serum creatinine has further improved however, sodium level has worsened today despite free water supplementation  Objective:  Vital signs in last 24 hours:  Temp:  [99.3 F (37.4 C)-101.8 F (38.8 C)] 99.3 F (37.4 C) (02/02 0900) Pulse Rate:  [80-102] 82 (02/02 0900) Resp:  [16-25] 20 (02/02 0900) BP: (81-157)/(42-61) 142/59 mmHg (02/02 0900) SpO2:  [93 %-100 %] 98 % (02/02 0900) FiO2 (%):  [40 %] 40 % (02/02 0913) Weight:  [126.3 kg (278 lb 7.1 oz)] 126.3 kg (278 lb 7.1 oz) (02/02 0536)  Weight change: -5.4 kg (-11 lb 14.5 oz) Filed Weights   10/27/15 0602 10/28/15 0500 10/29/15 0536  Weight: 129.6 kg (285 lb 11.5 oz) 131.7 kg (290 lb 5.5 oz) 126.3 kg (278 lb 7.1 oz)    Intake/Output: I/O last 3 completed shifts: In: 3916.5 [I.V.:636.5; NG/GT:3180; IV Piggyback:100] Out: 4700 [Urine:4700]   Intake/Output this shift:     Physical Exam: General: Critically ill, intubated, sedated  Head: +ETT, +OGT  Eyes: Eyes open   Neck: No masses  Lungs:  Vent assisted  Heart: Regular, no rub   Abdomen:  Soft, nontender, obese  Extremities: 3+ peripheral edema. +anasarca  Neurologic:  sedated, able to follow simple commands   Skin: Feet in soft support  Access: RIJ vascath 10/20/15 Dr. Stevenson Clinch    Basic Metabolic Panel:  Recent Labs Lab 10/22/15 1323 10/22/15 2053 10/23/15 0768 10/24/15 0444 10/25/15 0414 10/25/15 1709 10/26/15 0400 10/27/15 0433 10/28/15 0443 10/29/15 0421  NA 137 139 138 138 142  --  145 145 152* 154*  K 3.9 3.7 3.6 3.7 2.9* 3.3* 3.2* 3.3* 3.9 4.1  CL 102 100* 100* 103 104  --  110 110 119* 123*  CO2 '27 26 25 26 28  '$ --  '29 27 29 30  '$ GLUCOSE 314* 199* 246* 238* 231*  --  232* 197* 209* 211*  BUN 41* 49* 60* 91* 82*  --  89* 82* 71* 61*  CREATININE 1.72* 2.06* 2.48* 2.95* 2.36*  --  2.11*  1.80* 1.60* 1.40*  CALCIUM 7.9* 7.8* 7.7* 7.5* 7.5*  --  7.5* 7.3* 7.7* 7.8*  MG  --   --  2.2 2.3  --   --  2.3  --  2.3  --   PHOS 1.7* 2.8 2.8 3.2  --   --  4.5  --   --   --     Liver Function Tests:  Recent Labs Lab 10/22/15 1323 10/22/15 2053 10/23/15 0449 10/24/15 0444 10/26/15 0400  AST  --   --   --   --  92*  ALT  --   --   --   --  174*  ALKPHOS  --   --   --   --  188*  BILITOT  --   --   --   --  2.1*  PROT  --   --   --   --  5.3*  ALBUMIN 1.8* 1.8* 1.7* 1.7* 1.7*   No results for input(s): LIPASE, AMYLASE in the last 168 hours. No results for input(s): AMMONIA in the last 168 hours.  CBC:  Recent Labs Lab 10/25/15 0414 10/26/15 0400 10/27/15 0433 10/28/15 0443 10/29/15 0421  WBC 25.4* 23.6* 27.6* 26.2* 31.0*  HGB 10.8* 10.7* 10.0* 9.7* 9.4*  HCT  31.8* 32.2* 30.4* 29.9* 29.0*  MCV 87.5 89.3 91.0 93.0 92.1  PLT 36* 59* 82* 109* 132*    Cardiac Enzymes: No results for input(s): CKTOTAL, CKMB, CKMBINDEX, TROPONINI in the last 168 hours.  BNP: Invalid input(s): POCBNP  CBG:  Recent Labs Lab 10/28/15 1703 10/28/15 2014 10/28/15 2347 10/29/15 0413 10/29/15 0758  GLUCAP 165* 220* 209* 170* 152*    Microbiology: Results for orders placed or performed during the hospital encounter of 10/20/15  Urine culture     Status: None   Collection Time: 10/20/15  9:57 AM  Result Value Ref Range Status   Specimen Description URINE, RANDOM  Final   Special Requests NONE  Final   Culture >=100,000 COLONIES/mL PROTEUS MIRABILIS  Final   Report Status 10/22/2015 FINAL  Final   Organism ID, Bacteria PROTEUS MIRABILIS  Final      Susceptibility   Proteus mirabilis - MIC*    AMPICILLIN <=2 SENSITIVE Sensitive     GENTAMICIN <=1 SENSITIVE Sensitive     CEFTRIAXONE Value in next row Sensitive      SENSITIVE<=1    CIPROFLOXACIN Value in next row Sensitive      SENSITIVE<=0.25    IMIPENEM Value in next row Sensitive      SENSITIVE2    NITROFURANTOIN Value  in next row Resistant      RESISTANT128    TRIMETH/SULFA Value in next row Sensitive      SENSITIVE<=20    PIP/TAZO Value in next row Sensitive      SENSITIVE<=4    AMPICILLIN/SULBACTAM Value in next row Sensitive      SENSITIVE<=2    * >=100,000 COLONIES/mL PROTEUS MIRABILIS  Culture, blood (routine x 2)     Status: None   Collection Time: 10/20/15  9:57 AM  Result Value Ref Range Status   Specimen Description BLOOD RIGHT WRIST  Final   Special Requests   Final    BOTTLES DRAWN AEROBIC AND ANAEROBIC ANA 1ML AER 4ML   Culture  Setup Time   Final    GRAM NEGATIVE RODS IN BOTH AEROBIC AND ANAEROBIC BOTTLES CRITICAL RESULT CALLED TO, READ BACK BY AND VERIFIED WITH: MATT MCBANE ON 10/21/15 AT 0005 [facility] CONFIRMED BY [place]    Culture   Final    PROTEUS MIRABILIS IN BOTH AEROBIC AND ANAEROBIC BOTTLES    Report Status 10/22/2015 FINAL  Final   Organism ID, Bacteria PROTEUS MIRABILIS  Final      Susceptibility   Proteus mirabilis - MIC*    AMPICILLIN/SULBACTAM Value in next row Sensitive      SENSITIVE<=2    PIP/TAZO Value in next row Sensitive      SENSITIVE<=4    CEFTRIAXONE Value in next row Sensitive      SENSITIVE<=1    IMIPENEM Value in next row Sensitive      SENSITIVE2    GENTAMICIN Value in next row Sensitive      SENSITIVE<=1    CIPROFLOXACIN Value in next row Sensitive      SENSITIVE<=0.25    * PROTEUS MIRABILIS  Culture, blood (routine x 2)     Status: None   Collection Time: 10/20/15  9:57 AM  Result Value Ref Range Status   Specimen Description BLOOD LEFT HAND  Final   Special Requests   Final    BOTTLES DRAWN AEROBIC AND ANAEROBIC AER 3ML ANA 2ML   Culture  Setup Time   Final    GRAM NEGATIVE RODS IN BOTH AEROBIC AND ANAEROBIC  BOTTLES CRITICAL VALUE NOTED.  VALUE IS CONSISTENT WITH PREVIOUSLY REPORTED AND CALLED VALUE.    Culture   Final    PROTEUS MIRABILIS IN BOTH AEROBIC AND ANAEROBIC BOTTLES    Report Status 10/22/2015 FINAL  Final   Organism ID,  Bacteria PROTEUS MIRABILIS  Final      Susceptibility   Proteus mirabilis - MIC*    AMPICILLIN Value in next row Sensitive      SENSITIVE<=2    PIP/TAZO Value in next row Sensitive      SENSITIVE<=4    CEFTAZIDIME Value in next row Sensitive      SENSITIVE<=1    CEFTRIAXONE Value in next row Sensitive      SENSITIVE<=1    CEFEPIME Value in next row Sensitive      SENSITIVE<=1    IMIPENEM Value in next row Sensitive      SENSITIVE2    GENTAMICIN Value in next row Sensitive      SENSITIVE<=1    CIPROFLOXACIN Value in next row Sensitive      SENSITIVE<=0.25    * PROTEUS MIRABILIS  Blood Culture ID Panel (Reflexed)     Status: Abnormal   Collection Time: 10/20/15  9:57 AM  Result Value Ref Range Status   Enterococcus species NOT DETECTED NOT DETECTED Final   Listeria monocytogenes NOT DETECTED NOT DETECTED Final   Staphylococcus species NOT DETECTED NOT DETECTED Final   Staphylococcus aureus NOT DETECTED NOT DETECTED Final   Streptococcus species NOT DETECTED NOT DETECTED Final   Streptococcus agalactiae NOT DETECTED NOT DETECTED Final   Streptococcus pneumoniae NOT DETECTED NOT DETECTED Final   Streptococcus pyogenes NOT DETECTED NOT DETECTED Final   Acinetobacter baumannii NOT DETECTED NOT DETECTED Final   Enterobacteriaceae species DETECTED (A) NOT DETECTED Final    Comment: CRITICAL RESULT CALLED TO, READ BACK BY AND VERIFIED WITH: MATT MCBANE ON 10/21/15 AT 0005 [place]    Enterobacter cloacae complex NOT DETECTED NOT DETECTED Final   Escherichia coli NOT DETECTED NOT DETECTED Final   Klebsiella oxytoca NOT DETECTED NOT DETECTED Final   Klebsiella pneumoniae NOT DETECTED NOT DETECTED Final   Proteus species (A) NOT DETECTED Final    CRITICAL RESULT CALLED TO, READ BACK BY AND VERIFIED WITH:    Comment: MATT MCBANE ON 10/21/15 AT 0005 [place]   Serratia marcescens NOT DETECTED NOT DETECTED Final   Haemophilus influenzae NOT DETECTED NOT DETECTED Final   Neisseria meningitidis NOT  DETECTED NOT DETECTED Final   Pseudomonas aeruginosa NOT DETECTED NOT DETECTED Final   Candida albicans NOT DETECTED NOT DETECTED Final   Candida glabrata NOT DETECTED NOT DETECTED Final   Candida krusei NOT DETECTED NOT DETECTED Final   Candida parapsilosis NOT DETECTED NOT DETECTED Final   Candida tropicalis NOT DETECTED NOT DETECTED Final   Carbapenem resistance NOT DETECTED NOT DETECTED Final   Methicillin resistance NOT DETECTED NOT DETECTED Final   Vancomycin resistance NOT DETECTED NOT DETECTED Final  MRSA PCR Screening     Status: None   Collection Time: 10/20/15  7:41 PM  Result Value Ref Range Status   MRSA by PCR NEGATIVE NEGATIVE Final    Comment:        The GeneXpert MRSA Assay (FDA approved for NASAL specimens only), is one component of a comprehensive MRSA colonization surveillance program. It is not intended to diagnose MRSA infection nor to guide or monitor treatment for MRSA infections.   CULTURE, BLOOD (ROUTINE X 2) w Reflex to PCR ID Panel  Status: None   Collection Time: 10/22/15 11:39 AM  Result Value Ref Range Status   Specimen Description BLOOD LEFT HAND  Final   Special Requests BOTTLES DRAWN AEROBIC AND ANAEROBIC 1CC  Final   Culture NO GROWTH 5 DAYS  Final   Report Status 10/27/2015 FINAL  Final  Culture, expectorated sputum-assessment     Status: None   Collection Time: 10/22/15 12:00 PM  Result Value Ref Range Status   Specimen Description SPUTUM  Final   Special Requests Normal  Final   Sputum evaluation THIS SPECIMEN IS ACCEPTABLE FOR SPUTUM CULTURE  Final   Report Status 10/22/2015 FINAL  Final  Culture, respiratory (NON-Expectorated)     Status: None   Collection Time: 10/22/15 12:00 PM  Result Value Ref Range Status   Specimen Description SPUTUM  Final   Special Requests Normal Reflexed from [id number]  Final   Gram Stain   Final    FAIR SPECIMEN - 70-80% WBCS FEW WBC SEEN RARE YEAST RARE GRAM NEGATIVE RODS    Culture LIGHT GROWTH  ENTEROBACTER AEROGENES  Final   Report Status 10/24/2015 FINAL  Final   Organism ID, Bacteria ENTEROBACTER AEROGENES  Final      Susceptibility   Enterobacter aerogenes - MIC*    CEFAZOLIN >=64 RESISTANT Resistant     CEFEPIME <=1 SENSITIVE Sensitive     CEFTAZIDIME <=1 SENSITIVE Sensitive     CEFTRIAXONE <=1 SENSITIVE Sensitive     CIPROFLOXACIN <=0.25 SENSITIVE Sensitive     GENTAMICIN <=1 SENSITIVE Sensitive     IMIPENEM 2 SENSITIVE Sensitive     TRIMETH/SULFA <=20 SENSITIVE Sensitive     PIP/TAZO <=4 SENSITIVE Sensitive     * LIGHT GROWTH ENTEROBACTER AEROGENES  Urine culture     Status: None   Collection Time: 10/22/15 12:45 PM  Result Value Ref Range Status   Specimen Description URINE, RANDOM  Final   Special Requests NONE  Final   Culture NO GROWTH 2 DAYS  Final   Report Status 10/24/2015 FINAL  Final  Urine culture     Status: None (Preliminary result)   Collection Time: 10/28/15  2:36 PM  Result Value Ref Range Status   Specimen Description URINE, RANDOM  Final   Special Requests NONE  Final   Culture NO GROWTH < 24 HOURS  Final   Report Status PENDING  Incomplete    Coagulation Studies: No results for input(s): LABPROT, INR in the last 72 hours.  Urinalysis:  Recent Labs  10/28/15 1437  COLORURINE YELLOW*  LABSPEC 1.014  PHURINE 5.0  GLUCOSEU 50*  HGBUR 1+*  BILIRUBINUR NEGATIVE  KETONESUR NEGATIVE  PROTEINUR 30*  NITRITE NEGATIVE  LEUKOCYTESUR TRACE*      Imaging: Ct Abdomen Pelvis Wo Contrast  10/27/2015  CLINICAL DATA:  Sepsis. EXAM: CT ABDOMEN AND PELVIS WITHOUT CONTRAST TECHNIQUE: Multidetector CT imaging of the abdomen and pelvis was performed following the standard protocol without IV contrast. COMPARISON:  CT scan of October 20, 2015. 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 Mild bilateral pleural effusions are noted with adjacent atelectasis. No gallstones are noted. No focal abnormality is noted in the liver, spleen or  pancreas on these unenhanced images. Nasogastric tube tip is seen in proximal stomach. Adrenal glands appear normal. Left kidney and ureter appear normal. Moderate right hydronephrosis is again noted with nonobstructive calculus seen in upper pole calyx. 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 Atherosclerosis of abdominal aorta is noted without aneurysm formation.  There is no evidence of bowel obstruction. The appendix appears normal. There is no evidence of bowel obstruction. Urinary bladder is decompressed secondary to Foley catheter. Stable left ovarian cyst is noted. Uterus and right ovary appear normal. No significant adenopathy is noted. Mild anasarca is noted. IMPRESSION: Atherosclerosis of abdominal aorta without aneurysm formation. Nonobstructive right renal calculus is again noted. 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 Mild anasarca is noted. Mild bilateral pleural effusions are noted with adjacent atelectasis. Electronically Signed   By: Marijo Conception, M.D.   On: 10/27/2015 17:09   Dg Chest 1 View  10/29/2015  CLINICAL DATA:  Dyspnea EXAM: CHEST 1 VIEW COMPARISON:  10/28/2015 FINDINGS: Endotracheal tube in good position. Right jugular central venous catheter tip at the cavoatrial atrial junction. Left jugular central venous catheter tip at the cavoatrial junction. NG tube in place with tip not visualized due to underpenetration Diffuse bilateral airspace disease is unchanged. Slightly improved lung volume. Bibasilar atelectasis and small effusions. IMPRESSION: Support lines remain in good position Diffuse bilateral airspace disease unchanged, most consistent with pulmonary edema. Slightly improved lung volume. Electronically Signed   By: Franchot Gallo M.D.   On: 10/29/2015 07:32   Dg Chest 1 View  10/28/2015  CLINICAL DATA:  Dyspnea, sepsis, acute renal failure, respiratory  failure, intubated patient. EXAM: CHEST 1 VIEW COMPARISON:  Portable chest x-ray of October 26, 2015 FINDINGS: The lungs are mildly hypoinflated today. The pulmonary interstitial markings remain increased bilaterally. The hemidiaphragms are now obscured bilaterally. The cardiac silhouette is poorly defined but appears mildly enlarged. The central pulmonary vascularity is prominent. There are probable bilateral pleural effusions. The endotracheal tube tip lies approximately 3 cm above the carina. The right internal jugular venous catheter tip projects over the midportion of the SVC. The left internal jugular venous catheter tip projects over the proximal SVC. The esophagogastric tube tip projects below the inferior margin of the image. IMPRESSION: Worsening of pulmonary interstitial edema and bilateral pleural effusions. This is likely secondary to CHF though bilateral interstitial pneumonia could produce similar findings. Electronically Signed   By: David  Martinique M.D.   On: 10/28/2015 07:35   Ct Head Wo Contrast  10/27/2015  CLINICAL DATA:  Found lying on the floor minimally responsive EXAM: CT HEAD WITHOUT CONTRAST TECHNIQUE: Contiguous axial images were obtained from the base of the skull through the vertex without intravenous contrast. COMPARISON:  10/20/2015 FINDINGS: No acute cortical infarct, hemorrhage, or mass lesion ispresent. Ventricles are of normal size. No significant extra-axial fluid collection is present. The paranasal sinuses andmastoid air cells are clear. The osseous skull is intact. ET tube is identified. There is mild mucosal thickening involving the left maxillary sinus. The mastoid air cells are clear. The calvarium is intact. IMPRESSION: 1. No acute intracranial abnormalities identified. Electronically Signed   By: Kerby Moors M.D.   On: 10/27/2015 17:00     Medications:   . feeding supplement (VITAL AF 1.2 CAL) 1,000 mL (10/28/15 0600)  . fentaNYL infusion INTRAVENOUS 125 mcg/hr  (10/29/15 0809)  . norepinephrine (LEVOPHED) Adult infusion Stopped (10/25/15 2259)  . pureflow 2,000 mL/hr at 10/22/15 0104  . vasopressin (PITRESSIN) infusion - *FOR SHOCK* Stopped (10/23/15 0950)   . amiodarone  400 mg Oral BID  . antiseptic oral rinse  7 mL Mouth Rinse 10 times per day  . ceFEPime (MAXIPIME) IV  2 g Intravenous Q12H  . chlorhexidine gluconate  15 mL Mouth Rinse BID  .  feeding supplement (PRO-STAT SUGAR FREE 64)  30 mL Per Tube TID  . free water  200 mL Per Tube Q2H  . Influenza vac split quadrivalent PF  0.5 mL Intramuscular Tomorrow-1000  . insulin aspart  0-15 Units Subcutaneous 6 times per day  . insulin detemir  13 Units Subcutaneous QHS  . ipratropium-albuterol  3 mL Nebulization Q6H  . oxyCODONE  5 mg Oral Q6H  . pantoprazole sodium  40 mg Per Tube Q24H  . pneumococcal 23 valent vaccine  0.5 mL Intramuscular Tomorrow-1000  . potassium chloride  40 mEq Oral Daily  . senna-docusate  1 tablet Oral BID   acetaminophen **OR** acetaminophen, iohexol, LORazepam  Assessment/ Plan:  April [name] is a 70 y.o. white female with DM (A1c 7.7%),  who was admitted to [facility] on 10/20/2015  1. Acute renal failure with metabolic acidosis:  - Unknown baseline creatinine. Admit Cr 3.01 Required CRRT from 1/24 to 1/27. Then intermittent hemodialysis 1/28.  - Acute renal failure from sepsis leading to ATN. Serologic testing negative 10/21/15 - Now nonoliguric with good urine output. >3100 - Electrolytes and Volume status are acceptable No acute indication for Dialysis at present  - May remove dialysis catheter  2. 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 Hypokalemia: post ATN diuresis.  - potassium being replaced. prn  4. Sepsis/hypotension secondary to urinary tract infection: proteus - Broad spectrum ABx as per IM team  5.  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   6. Hypernatremia - from post ATN diuresis - increase free water replacement  - currently 200 q 2 hrs (2400 cc free water in 24 hrs) - deficit > 5 L   LOS: 9 Damariz Paganelli 2/2/201711:00 AM

## 2015-10-29 NOTE — Progress Notes (Addendum)
Inpatient Diabetes Program Recommendations  AACE/ADA: New Consensus Statement on Inpatient Glycemic Control (2015)  Target Ranges:  Prepandial:   less than 140 mg/dL      Peak postprandial:   less than 180 mg/dL (1-2 hours)      Critically ill patients:  140 - 180 mg/dL   Review of Glycemic Control  Results for LIS, SAVITT (MRN [id number]) as of 10/29/2015 10:46  Ref. Range 10/28/2015 17:03 10/28/2015 20:14 10/28/2015 23:47 10/29/2015 04:13 10/29/2015 07:58  Glucose-Capillary Latest Ref Range: 65-99 mg/dL 165 (H) 220 (H) 209 (H) 170 (H) 152 (H)    Current orders for Inpatient glycemic control: Levemir 13 units QHS, Novolog 0-15 units Q4H  Inpatient Diabetes Program Recommendations:  Patient was ordered Novolog 5 units Q4H for tube feeding coverage. However, it was discontinued 10/26/15 when patient was started on IV insulin drip. Please consider re-ordering Novolog 5 units Q4H for tube feeding coverage (in addition to Novolog correction scale)- this will ensure the patient gets a more consistent amount of Novolog each time   Gentry Fitz, RN, [place], [place], CDE Diabetes Coordinator Inpatient Diabetes Program  [phone] (Team Pager) [phone] ([place]) 10/29/2015 10:48 AM

## 2015-10-29 NOTE — Progress Notes (Signed)
Pt is tolerating Bipap well. O2 sats at 98. Family visiting at bedside.  Brooke received report. Pt in stable condition.

## 2015-10-29 NOTE — Progress Notes (Signed)
[place] Progress Note Patient Name: April [name] DOB: 10-Nov-1945 MRN: [id number]   Date of Service  10/29/2015  HPI/Events of Note  Extubated today, then on BIPAP Has bilateral effusions, pulm edema  eICU Interventions  Lasix x1 now     Intervention Category Major Interventions: Respiratory failure - evaluation and management  Simonne Maffucci 10/29/2015, 4:49 PM

## 2015-10-29 NOTE — Consult Note (Signed)
Palliative Medicine Inpatient Consult Note   Name: April [name] Date: 10/29/2015 MRN: [id number]  DOB: 1945/10/20  Referring Physician: Henreitta Leber, MD   Hospital Course to date:   Palliative Care consult requested for this 70 y.o. female for goals of medical therapy in patient with a patient who was found unresponsive by coworkers on Jan 24th. Downtime is unknown but she had not shown up for work for two days and they found her lying on the floor very minimally responsive. She has very limited history available as she has no known close relatives.  She has an elderly uncle but he did not know of any medical history. I am told that her 'work family is like her real family'. 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  She was intubated by EMS and is currently undergoing a weaning trial.    She was started empirically on Zosyn and Vancomycin and given fluids for suspected sepsis with acute renal failure. She was started on dialysis but her kidney function has recovered.  She has had a fever up to 101 degrees all day yesterday, but it is less than 100 today.   Her lactic acid was 6.6 and she was treated for this along with the rhabdomyolysis secondary to her lying on the floor for an unknown time period.  Blood cultures and urine culture grew Proteus Mirabilis and subsequently her sputum grew Enterobacter aerogenes. Now she is on Broadened ABX regimen and fever is now down.  She has had atrial fibrillation with RVR and rate is controlled on Tube delivered amiodarone.  She has some chronic mild right hydronephrosis seen on imaging.   Today, she has followed some commands.  Eyes would open and she would attempt to form a fist (but could not).  Creatinine is down to 1.40 from 1.87.  Sodium has increased,however, despite free water.  Troponins were high at 1.0 at admission and have come down.  Albumin was 2.1 at admission and is down to 1.7.   LFTs were  mildly elevated.   WBC was 47 at admission and is down to 31 (it has been lower).  Platelet count was 46 at adm and is now 132 (n;).  Hgb was 13.6 at adm and is 9.4 today.  Glucose was 263 at admission and sodium was 135 so she was not hypernatremic.  Echo showed EF of between 55 and 65% but it was a poor quality echo.  Urine drug screen was negative and alcohol level was negative. A CXR on 2/1 showed worsening of pulmonary edema.  She is tolerating vent wean right now, but her distal toes are quite purple--though she is now off of pressors.  . A CT of abdomen shows a non-obstructive calculus w/ hydronephrosis.  CT of head was negative.  She has been fighting the vent and biting it ---but has stopped doing that today.    TODAY'S DISCUSSIONS AND DECISIONS: I was present today when pt was extubated after being successfully weaned from the ventilator.  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 She did not speak or follow commands right after extubations but we will give her time and re-assess.   Her toes are black bilaterally from the vasoconstrictive effects of the pressors.  I have spoken twice today with the Care Manager and I let her know that it would be a bit premature  for me to talk to pts uncle or her work family about palliative issues --since for now, it seems that she needs to be given a bit more time so we can see which direction her cognition is going to go while off the vent (which means less sedation will be needed).    Will check back tomorrow now that I am very familiar with pt and see how she is doing at that time and talk with pt if she can converse or interact and then talk with family/ family surrogates.     IMPRESSION: Sepsis with septic shock ---Bld cxs growing Proteus Mirabilis ---Urine cx growing Proteus Mirabilis ---Likely due to UTI  Metabolic acidosis Acute Renal Failure due to ATN due to septic shock and  rhabdomyolysis ---now resolved after CRRT (changed to short course of HD due to low platelets) ---Hydronephrosis on right is chronic  She has a nonobstructive right renal calculus  Acute respiratory failure due to being unable to protect her airway Severe Leukocytosis --due to sepsis  Enterobacter in Sputum with recent fever spike ---now on Cefipime for presumed resp infection A Fib with RVR with rate controlled now ---now off amiodarone drops and on oral amio Thrombocytopenia Hypokalemia Elevated Liver enzymes due to shock liver --improving DM2 --new onset with Hgb I4P 7.7 Metabolic encephalopathy  ---only now attempting to respond to commands Elevated troponin --not felt to represent an MI (only demand ischemia) Twitching and tremors ---unclear etiology --possible neuro consult to follow Atelectasis --with possibilitiy of penumonia Fever 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     REVIEW OF SYSTEMS:  Patient is not able to provide ROS due to critical illness  SPIRITUAL SUPPORT SYSTEM: only has an uncle and a work family.  SOCIAL HISTORY: Apparently lived alone.  Work family is more like her family per report.  Little is known. 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    LEGAL DOCUMENTS:  None   CODE STATUS: Full code  PAST MEDICAL HISTORY:  Nothing known but she did have a high HbA1C and so had DM but it may not have been known by pt.    PAST SURGICAL HISTORY:   Nothing is known.    ALLERGIES:  is allergic to prednisone.  MEDICATIONS:  Current Facility-Administered Medications  Medication Dose Route Frequency Provider Last Rate Last Dose  . acetaminophen (TYLENOL)  tablet 650 mg  650 mg Oral Q6H PRN Fritzi Mandes, MD   650 mg at 10/28/15 2350   Or  . acetaminophen (TYLENOL) suppository 650 mg  650 mg Rectal Q6H PRN Fritzi Mandes, MD      . amiodarone (PACERONE) tablet 400 mg  400 mg Oral BID Wellington Hampshire, MD   400 mg at 10/29/15 1120  . antiseptic oral rinse solution (CORINZ)  7 mL Mouth Rinse 10 times per day Henreitta Leber, MD   7 mL at 10/29/15 1139  . ceFEPIme (MAXIPIME) 2 g in dextrose 5 % 50 mL IVPB  2 g Intravenous Q12H Henreitta Leber, MD   2 g at 10/29/15 1119  . chlorhexidine gluconate (PERIDEX) 0.12 % solution 15 mL  15 mL Mouth Rinse BID Henreitta Leber, MD   15 mL at 10/29/15 0809  .  feeding supplement (PRO-STAT SUGAR FREE 64) liquid 30 mL  30 mL Per Tube TID Vishal Mungal, MD   30 mL at 10/29/15 1145  . feeding supplement (VITAL AF 1.2 CAL) liquid 1,000 mL  1,000 mL Per Tube Continuous Flora Lipps, MD 50 mL/hr at 10/28/15 0600 1,000 mL at 10/28/15 0600  . fentaNYL 2581mg in NS 2596m(1038mml) infusion-PREMIX  0-400 mcg/hr Intravenous Continuous KurFlora LippsD 12.5 mL/hr at 10/29/15 0809 125 mcg/hr at 10/29/15 0809  . free water 200 mL  200 mL Per Tube Q2H Harmeet Singh, MD   200 mL at 10/29/15 1115  . insulin aspart (novoLOG) injection 0-15 Units  0-15 Units Subcutaneous 6 times per day DavLance CoonD   3 Units at 10/29/15 [id number] insulin detemir (LEVEMIR) injection 13 Units  13 Units Subcutaneous QHS DavLance CoonD   13 Units at 10/28/15 2107  . iohexol (OMNIPAQUE) 240 MG/ML injection 50 mL  50 mL Oral Once PRN SarLavonia DanaD      . ipratropium-albuterol (DUONEB) 0.5-2.5 (3) MG/3ML nebulizer solution 3 mL  3 mL Nebulization Q6H MicTanda RockersD   3 mL at 10/29/15 0912  . LORazepam (ATIVAN) injection 2 mg  2 mg Intravenous Q1H PRN KurFlora LippsD   2 mg at 10/27/15 0419  . norepinephrine (LEVOPHED) 16 mg in dextrose 5 % 250 mL (0.064 mg/mL) infusion  0-70 mcg/min Intravenous Titrated SteAnders SimmondsD   Stopped at 10/25/15 2259  .  oxyCODONE (Oxy IR/ROXICODONE) immediate release tablet 5 mg  5 mg Oral Q6H KurFlora LippsD   5 mg at 10/29/15 1119  . pantoprazole sodium (PROTONIX) 40 mg/20 mL oral suspension 40 mg  40 mg Per Tube Q24H MicCharlett NosePH   40 mg at 10/28/15 2107  . potassium chloride (KLOR-CON) packet 40 mEq  40 mEq Oral Daily HarMurlean IbaD   40 mEq at 10/29/15 1119  . pureflow IV solution for Dialysis   CRRT Continuous SarLavonia DanaD 2,000 mL/hr at 10/22/15 0104    . senna-docusate (Senokot-S) tablet 1 tablet  1 tablet Oral BID MicCharlett NosePHJustice [facility]urg [facility] Ltd1 tablet at 10/29/15 1119  . vasopressin (PITRESSIN) 40 Units in sodium chloride 0.9 % 250 mL (0.16 Units/mL) infusion  0.03 Units/min Intravenous Continuous WesRush FarmerD   Stopped at 10/23/15 0950    Vital Signs: BP 143/56 mmHg  Pulse 87  Temp(Src) 99.1 F (37.3 C) (Oral)  Resp 19  Ht '5\' 7"'$  (1.702 m)  Wt 126.3 kg (278 lb 7.1 oz)  BMI 43.60 kg/m2  SpO2 99% Filed Weights   10/27/15 0602 10/28/15 0500 10/29/15 0536  Weight: 129.6 kg (285 lb 11.5 oz) 131.7 kg (290 lb 5.5 oz) 126.3 kg (278 lb 7.1 oz)    Estimated body mass index is 43.6 kg/(m^2) as calculated from the following:   Height as of this encounter: '5\' 7"'$  (1.702 m).   Weight as of this encounter: 126.3 kg (278 lb 7.1 oz).  PERFORMANCE STATUS (ECOG) : 4 - Bedbound  PHYSICAL EXAM: Initially intubated ---now extubated Not focusing except on one nurse Not following commands yet after extubation Op suctioned Neck w/o JVD or TM Hrt rrr no m Lungs with some ronchi no rales Abd soft and NT Ext with blackened toes bilaterally    LABS: CBC:    Component Value Date/Time   WBC 31.0* 10/29/2015 0421   HGB 9.4* 10/29/2015 0421   HCT 29.0* 10/29/2015  0421   PLT 132* 10/29/2015 0421   MCV 92.1 10/29/2015 0421   NEUTROABS 47.0* 10/20/2015 0957   LYMPHSABS 2.0 10/20/2015 0957   MONOABS 1.5* 10/20/2015 0957   EOSABS 0.0 10/20/2015 0957   BASOSABS 0.0 10/20/2015 0957    Comprehensive Metabolic Panel:    Component Value Date/Time   NA 154* 10/29/2015 0421   K 4.1 10/29/2015 0421   CL 123* 10/29/2015 0421   CO2 30 10/29/2015 0421   BUN 61* 10/29/2015 0421   CREATININE 1.40* 10/29/2015 0421   GLUCOSE 211* 10/29/2015 0421   CALCIUM 7.8* 10/29/2015 0421   AST 92* 10/26/2015 0400   ALT 174* 10/26/2015 0400   ALKPHOS 188* 10/26/2015 0400   BILITOT 2.1* 10/26/2015 0400   PROT 5.3* 10/26/2015 0400   ALBUMIN 1.7* 10/26/2015 0400     TESTS CT A/P wo CM 10/27/15: Severe degenerative disc disease is noted at L4-5.  Atherosclerosis of abdominal aorta without aneurysm formation. Nonobstructive right renal calculus (at upper pole calyx) is again noted. 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 Mild anasarca is noted. Mild bilateral pleural effusions are noted with adjacent atelectasis.   CXR 10/28/15: Worsening of pulmonary interstitial edema and bilateral pleural effusions. This is likely secondary to CHF though bilateral interstitial pneumonia could produce similar findings.  More than 50% of the visit was spent in counseling/coordination of care: Yes  Time Spent: 80 minutes

## 2015-10-29 NOTE — Care Management (Signed)
Patient was extubated today and currently tolerating bipap at 40%.  Palliative care consult has been initiated

## 2015-10-29 NOTE — Progress Notes (Signed)
MD order to extubate. Pt was suctioned prior to extubation for a moderate amount of thick white secretions. She was extubated and placed on a 3 L nasal cannula. BS were essentially to auscultation.

## 2015-10-29 NOTE — Progress Notes (Signed)
Pt temp now 99, awake at times will nod head to yes or no questions but does not follow direct commands. Fentanyl running at 125

## 2015-10-29 NOTE — Progress Notes (Signed)
[place] NOTE  Pharmacy Consult for Cefepime Dosing, Electrolyte Management, Constipation Prevention Indication: HD/ Proteus Bacteremia     Allergies  Allergen Reactions  . Prednisone Anaphylaxis    Patient Measurements: Height: '5\' 7"'$  (170.2 cm) Weight: 278 lb 7.1 oz (126.3 kg) IBW/kg (Calculated) : 61.6   Vital Signs: Temp: 99.1 F (37.3 C) (02/02 1100) Temp Source: Core (Comment) (02/02 0800) BP: 143/56 mmHg (02/02 1100) Pulse Rate: 87 (02/02 1100) Intake/Output from previous day: 02/01 0701 - 02/02 0700 In: 2986.5 [I.V.:306.5; NG/GT:2580; IV Piggyback:100] Out: 3100 [Urine:3100] Intake/Output from this shift: Total I/O In: 150 [NG/GT:100; IV Piggyback:50] Out: 1425 [Urine:1425] Vent settings for last 24 hours: Vent Mode:  [-] Spontaneous FiO2 (%):  [40 %] 40 % Set Rate:  [18 bmp-25 bmp] 18 bmp Vt Set:  [550 mL] 550 mL PEEP:  [5 cmH20] 5 cmH20 Pressure Support:  [5 cmH20] 5 cmH20 Plateau Pressure:  [18 cmH20] 18 cmH20  Labs:  Recent Labs  10/27/15 0433 10/28/15 0443 10/29/15 0421  WBC 27.6* 26.2* 31.0*  HGB 10.0* 9.7* 9.4*  HCT 30.4* 29.9* 29.0*  PLT 82* 109* 132*  CREATININE 1.80* 1.60* 1.40*  MG  --  2.3  --    Estimated Creatinine Clearance: 52.4 mL/min (by C-G formula based on Cr of 1.4).   Recent Labs  10/29/15 0413 10/29/15 0758 10/29/15 1201  GLUCAP 170* 152* 164*    Microbiology: Recent Results (from the past 720 hour(s))  Urine culture     Status: None   Collection Time: 10/20/15  9:57 AM  Result Value Ref Range Status   Specimen Description URINE, RANDOM  Final   Special Requests NONE  Final   Culture >=100,000 COLONIES/mL PROTEUS MIRABILIS  Final   Report Status 10/22/2015 FINAL  Final   Organism ID, Bacteria PROTEUS MIRABILIS  Final      Susceptibility   Proteus mirabilis - MIC*    AMPICILLIN <=2 SENSITIVE Sensitive     GENTAMICIN <=1 SENSITIVE Sensitive     CEFTRIAXONE Value in next row Sensitive      SENSITIVE<=1    CIPROFLOXACIN Value in next row Sensitive      SENSITIVE<=0.25    IMIPENEM Value in next row Sensitive      SENSITIVE2    NITROFURANTOIN Value in next row Resistant      RESISTANT128    TRIMETH/SULFA Value in next row Sensitive      SENSITIVE<=20    PIP/TAZO Value in next row Sensitive      SENSITIVE<=4    AMPICILLIN/SULBACTAM Value in next row Sensitive      SENSITIVE<=2    * >=100,000 COLONIES/mL PROTEUS MIRABILIS  Culture, blood (routine x 2)     Status: None   Collection Time: 10/20/15  9:57 AM  Result Value Ref Range Status   Specimen Description BLOOD RIGHT WRIST  Final   Special Requests   Final    BOTTLES DRAWN AEROBIC AND ANAEROBIC ANA 1ML AER 4ML   Culture  Setup Time   Final    GRAM NEGATIVE RODS IN BOTH AEROBIC AND ANAEROBIC BOTTLES CRITICAL RESULT CALLED TO, READ BACK BY AND VERIFIED WITH: MATT Afton ON 10/21/15 AT 0005 [facility] CONFIRMED BY [place]    Culture   Final    PROTEUS MIRABILIS IN BOTH AEROBIC AND ANAEROBIC BOTTLES    Report Status 10/22/2015 FINAL  Final   Organism ID, Bacteria PROTEUS MIRABILIS  Final      Susceptibility   Proteus mirabilis -  MIC*    AMPICILLIN/SULBACTAM Value in next row Sensitive      SENSITIVE<=2    PIP/TAZO Value in next row Sensitive      SENSITIVE<=4    CEFTRIAXONE Value in next row Sensitive      SENSITIVE<=1    IMIPENEM Value in next row Sensitive      SENSITIVE2    GENTAMICIN Value in next row Sensitive      SENSITIVE<=1    CIPROFLOXACIN Value in next row Sensitive      SENSITIVE<=0.25    * PROTEUS MIRABILIS  Culture, blood (routine x 2)     Status: None   Collection Time: 10/20/15  9:57 AM  Result Value Ref Range Status   Specimen Description BLOOD LEFT HAND  Final   Special Requests   Final    BOTTLES DRAWN AEROBIC AND ANAEROBIC AER 3ML ANA 2ML   Culture  Setup Time   Final    GRAM NEGATIVE RODS IN BOTH AEROBIC AND ANAEROBIC BOTTLES CRITICAL VALUE NOTED.  VALUE IS CONSISTENT WITH PREVIOUSLY REPORTED  AND CALLED VALUE.    Culture   Final    PROTEUS MIRABILIS IN BOTH AEROBIC AND ANAEROBIC BOTTLES    Report Status 10/22/2015 FINAL  Final   Organism ID, Bacteria PROTEUS MIRABILIS  Final      Susceptibility   Proteus mirabilis - MIC*    AMPICILLIN Value in next row Sensitive      SENSITIVE<=2    PIP/TAZO Value in next row Sensitive      SENSITIVE<=4    CEFTAZIDIME Value in next row Sensitive      SENSITIVE<=1    CEFTRIAXONE Value in next row Sensitive      SENSITIVE<=1    CEFEPIME Value in next row Sensitive      SENSITIVE<=1    IMIPENEM Value in next row Sensitive      SENSITIVE2    GENTAMICIN Value in next row Sensitive      SENSITIVE<=1    CIPROFLOXACIN Value in next row Sensitive      SENSITIVE<=0.25    * PROTEUS MIRABILIS  Blood Culture ID Panel (Reflexed)     Status: Abnormal   Collection Time: 10/20/15  9:57 AM  Result Value Ref Range Status   Enterococcus species NOT DETECTED NOT DETECTED Final   Listeria monocytogenes NOT DETECTED NOT DETECTED Final   Staphylococcus species NOT DETECTED NOT DETECTED Final   Staphylococcus aureus NOT DETECTED NOT DETECTED Final   Streptococcus species NOT DETECTED NOT DETECTED Final   Streptococcus agalactiae NOT DETECTED NOT DETECTED Final   Streptococcus pneumoniae NOT DETECTED NOT DETECTED Final   Streptococcus pyogenes NOT DETECTED NOT DETECTED Final   Acinetobacter baumannii NOT DETECTED NOT DETECTED Final   Enterobacteriaceae species DETECTED (A) NOT DETECTED Final    Comment: CRITICAL RESULT CALLED TO, READ BACK BY AND VERIFIED WITH: MATT MCBANE ON 10/21/15 AT 0005 [place]    Enterobacter cloacae complex NOT DETECTED NOT DETECTED Final   Escherichia coli NOT DETECTED NOT DETECTED Final   Klebsiella oxytoca NOT DETECTED NOT DETECTED Final   Klebsiella pneumoniae NOT DETECTED NOT DETECTED Final   Proteus species (A) NOT DETECTED Final    CRITICAL RESULT CALLED TO, READ BACK BY AND VERIFIED WITH:    Comment: MATT MCBANE ON  10/21/15 AT 0005 [place]   Serratia marcescens NOT DETECTED NOT DETECTED Final   Haemophilus influenzae NOT DETECTED NOT DETECTED Final   Neisseria meningitidis NOT DETECTED NOT DETECTED Final   Pseudomonas aeruginosa NOT  DETECTED NOT DETECTED Final   Candida albicans NOT DETECTED NOT DETECTED Final   Candida glabrata NOT DETECTED NOT DETECTED Final   Candida krusei NOT DETECTED NOT DETECTED Final   Candida parapsilosis NOT DETECTED NOT DETECTED Final   Candida tropicalis NOT DETECTED NOT DETECTED Final   Carbapenem resistance NOT DETECTED NOT DETECTED Final   Methicillin resistance NOT DETECTED NOT DETECTED Final   Vancomycin resistance NOT DETECTED NOT DETECTED Final  MRSA PCR Screening     Status: None   Collection Time: 10/20/15  7:41 PM  Result Value Ref Range Status   MRSA by PCR NEGATIVE NEGATIVE Final    Comment:        The GeneXpert MRSA Assay (FDA approved for NASAL specimens only), is one component of a comprehensive MRSA colonization surveillance program. It is not intended to diagnose MRSA infection nor to guide or monitor treatment for MRSA infections.   CULTURE, BLOOD (ROUTINE X 2) w Reflex to PCR ID Panel     Status: None   Collection Time: 10/22/15 11:39 AM  Result Value Ref Range Status   Specimen Description BLOOD LEFT HAND  Final   Special Requests BOTTLES DRAWN AEROBIC AND ANAEROBIC 1CC  Final   Culture NO GROWTH 5 DAYS  Final   Report Status 10/27/2015 FINAL  Final  Culture, expectorated sputum-assessment     Status: None   Collection Time: 10/22/15 12:00 PM  Result Value Ref Range Status   Specimen Description SPUTUM  Final   Special Requests Normal  Final   Sputum evaluation THIS SPECIMEN IS ACCEPTABLE FOR SPUTUM CULTURE  Final   Report Status 10/22/2015 FINAL  Final  Culture, respiratory (NON-Expectorated)     Status: None   Collection Time: 10/22/15 12:00 PM  Result Value Ref Range Status   Specimen Description SPUTUM  Final   Special Requests Normal  Reflexed from [id number]  Final   Gram Stain   Final    FAIR SPECIMEN - 70-80% WBCS FEW WBC SEEN RARE YEAST RARE GRAM NEGATIVE RODS    Culture LIGHT GROWTH ENTEROBACTER AEROGENES  Final   Report Status 10/24/2015 FINAL  Final   Organism ID, Bacteria ENTEROBACTER AEROGENES  Final      Susceptibility   Enterobacter aerogenes - MIC*    CEFAZOLIN >=64 RESISTANT Resistant     CEFEPIME <=1 SENSITIVE Sensitive     CEFTAZIDIME <=1 SENSITIVE Sensitive     CEFTRIAXONE <=1 SENSITIVE Sensitive     CIPROFLOXACIN <=0.25 SENSITIVE Sensitive     GENTAMICIN <=1 SENSITIVE Sensitive     IMIPENEM 2 SENSITIVE Sensitive     TRIMETH/SULFA <=20 SENSITIVE Sensitive     PIP/TAZO <=4 SENSITIVE Sensitive     * LIGHT GROWTH ENTEROBACTER AEROGENES  Urine culture     Status: None   Collection Time: 10/22/15 12:45 PM  Result Value Ref Range Status   Specimen Description URINE, RANDOM  Final   Special Requests NONE  Final   Culture NO GROWTH 2 DAYS  Final   Report Status 10/24/2015 FINAL  Final  Urine culture     Status: None (Preliminary result)   Collection Time: 10/28/15  2:36 PM  Result Value Ref Range Status   Specimen Description URINE, RANDOM  Final   Special Requests NONE  Final   Culture NO GROWTH < 24 HOURS  Final   Report Status PENDING  Incomplete    Medications:  Scheduled:  . amiodarone  400 mg Oral BID  . antiseptic oral rinse  7  mL Mouth Rinse 10 times per day  . ceFEPime (MAXIPIME) IV  2 g Intravenous Q12H  . chlorhexidine gluconate  15 mL Mouth Rinse BID  . feeding supplement (PRO-STAT SUGAR FREE 64)  30 mL Per Tube TID  . free water  200 mL Per Tube Q2H  . insulin aspart  0-15 Units Subcutaneous 6 times per day  . insulin detemir  13 Units Subcutaneous QHS  . ipratropium-albuterol  3 mL Nebulization Q6H  . oxyCODONE  5 mg Oral Q6H  . pantoprazole sodium  40 mg Per Tube Q24H  . potassium chloride  40 mEq Oral Daily  . senna-docusate  1 tablet Oral BID   Infusions:  . feeding  supplement (VITAL AF 1.2 CAL) 1,000 mL (10/28/15 0600)  . fentaNYL infusion INTRAVENOUS 125 mcg/hr (10/29/15 0809)  . norepinephrine (LEVOPHED) Adult infusion Stopped (10/25/15 2259)  . pureflow 2,000 mL/hr at 10/22/15 0104  . vasopressin (PITRESSIN) infusion - *FOR SHOCK* Stopped (10/23/15 6728)    Assessment: 70 y/o F with septic shock from Proteus UTI. Patient with ARF recently transitioned from CRRT to HD not currently requiring HD.    Plan:  1. Will continue cefepime dosing  2 g iv q 12 hours for renal function.   2. Electrolytes: Electrolytes are wnl. Will recheck electrolytes with am labs.   3. Constipation: Patient with last BM 1/31. Will continue patient on senna/docusate 1 tab  BID.    Pharmacy will continue to monitor and adjust per consult.    Ulice Dash, PharmD Clinical Pharmacist   10/29/2015

## 2015-10-30 ENCOUNTER — Inpatient Hospital Stay: Payer: Medicare HMO

## 2015-10-30 DIAGNOSIS — E876 Hypokalemia: Secondary | ICD-10-CM

## 2015-10-30 DIAGNOSIS — R400 Somnolence: Secondary | ICD-10-CM

## 2015-10-30 LAB — BLOOD GAS, ARTERIAL
Acid-Base Excess: 9 mmol/L — ABNORMAL HIGH (ref 0.0–3.0)
Bicarbonate: 32.7 mEq/L — ABNORMAL HIGH (ref 21.0–28.0)
Delivery systems: POSITIVE
Expiratory PAP: 5
FIO2: 0.3
INSPIRATORY PAP: 10
LHR: 8 {breaths}/min
O2 Saturation: 92.2 %
PO2 ART: 57 mmHg — AB (ref 83.0–108.0)
Patient temperature: 37
pCO2 arterial: 40 mmHg (ref 32.0–48.0)
pH, Arterial: 7.52 — ABNORMAL HIGH (ref 7.350–7.450)

## 2015-10-30 LAB — CBC
HEMATOCRIT: 30.8 % — AB (ref 35.0–47.0)
Hemoglobin: 10 g/dL — ABNORMAL LOW (ref 12.0–16.0)
MCH: 30.4 pg (ref 26.0–34.0)
MCHC: 32.3 g/dL (ref 32.0–36.0)
MCV: 94.1 fL (ref 80.0–100.0)
Platelets: 181 10*3/uL (ref 150–440)
RBC: 3.28 MIL/uL — ABNORMAL LOW (ref 3.80–5.20)
RDW: 14.9 % — AB (ref 11.5–14.5)
WBC: 29.4 10*3/uL — ABNORMAL HIGH (ref 3.6–11.0)

## 2015-10-30 LAB — MAGNESIUM: Magnesium: 2.1 mg/dL (ref 1.7–2.4)

## 2015-10-30 LAB — GLUCOSE, CAPILLARY
GLUCOSE-CAPILLARY: 130 mg/dL — AB (ref 65–99)
GLUCOSE-CAPILLARY: 166 mg/dL — AB (ref 65–99)
GLUCOSE-CAPILLARY: 169 mg/dL — AB (ref 65–99)
GLUCOSE-CAPILLARY: 174 mg/dL — AB (ref 65–99)
Glucose-Capillary: 104 mg/dL — ABNORMAL HIGH (ref 65–99)
Glucose-Capillary: 152 mg/dL — ABNORMAL HIGH (ref 65–99)
Glucose-Capillary: 159 mg/dL — ABNORMAL HIGH (ref 65–99)

## 2015-10-30 LAB — BASIC METABOLIC PANEL
Anion gap: 5 (ref 5–15)
BUN: 54 mg/dL — ABNORMAL HIGH (ref 6–20)
CALCIUM: 8.1 mg/dL — AB (ref 8.9–10.3)
CO2: 31 mmol/L (ref 22–32)
CREATININE: 1.33 mg/dL — AB (ref 0.44–1.00)
Chloride: 121 mmol/L — ABNORMAL HIGH (ref 101–111)
GFR calc Af Amer: 46 mL/min — ABNORMAL LOW (ref >60)
GFR calc non Af Amer: 40 mL/min — ABNORMAL LOW (ref >60)
GLUCOSE: 171 mg/dL — AB (ref 65–99)
Potassium: 3.7 mmol/L (ref 3.5–5.1)
Sodium: 157 mmol/L — ABNORMAL HIGH (ref 135–145)

## 2015-10-30 LAB — URINE CULTURE: Culture: NO GROWTH

## 2015-10-30 LAB — PHOSPHORUS: PHOSPHORUS: 3.3 mg/dL (ref 2.5–4.6)

## 2015-10-30 MED ORDER — SENNOSIDES-DOCUSATE SODIUM 8.6-50 MG PO TABS: 2.0000 | ORAL_TABLET | Freq: Two times a day (BID) | ORAL | Status: DC

## 2015-10-30 MED ORDER — DEXTROSE 5 % IV SOLN
INTRAVENOUS | Status: DC
  Administered 2015-10-30 – 2015-11-04 (×8): via INTRAVENOUS

## 2015-10-30 MED ORDER — LORAZEPAM 2 MG/ML IJ SOLN
0.5000 mg | INTRAMUSCULAR | Status: DC | PRN
  Administered 2015-11-02: 0.5 mg via INTRAVENOUS
  Filled 2015-10-30: qty 1

## 2015-10-30 MED ORDER — PANTOPRAZOLE SODIUM 40 MG IV SOLR
40.0000 mg | INTRAVENOUS | Status: DC
  Administered 2015-10-30 – 2015-11-02 (×4): 40 mg via INTRAVENOUS
  Filled 2015-10-30 (×4): qty 40

## 2015-10-30 MED ORDER — OXYCODONE HCL 5 MG PO TABS: 5.0000 mg | ORAL_TABLET | ORAL | Status: DC | PRN

## 2015-10-30 MED ORDER — FUROSEMIDE 10 MG/ML IJ SOLN
40.0000 mg | Freq: Once | INTRAMUSCULAR | Status: AC
  Administered 2015-10-30: 40 mg via INTRAVENOUS
  Filled 2015-10-30: qty 4

## 2015-10-30 MED ORDER — ONDANSETRON HCL 4 MG/2ML IJ SOLN: 4.0000 mg | Freq: Four times a day (QID) | INTRAMUSCULAR | Status: DC | PRN

## 2015-10-30 MED ORDER — PROCHLORPERAZINE 25 MG RE SUPP
25.0000 mg | Freq: Three times a day (TID) | RECTAL | Status: DC | PRN
  Filled 2015-10-30: qty 1

## 2015-10-30 MED ORDER — MORPHINE SULFATE (PF) 2 MG/ML IV SOLN
2.0000 mg | INTRAVENOUS | Status: DC | PRN
  Administered 2015-11-01 – 2015-11-02 (×2): 2 mg via INTRAVENOUS
  Filled 2015-10-30 (×2): qty 1

## 2015-10-30 MED ORDER — BISACODYL 10 MG RE SUPP: 10.0000 mg | Freq: Every day | RECTAL | Status: DC | PRN

## 2015-10-30 NOTE — Progress Notes (Signed)
[facility] NOTE  Pharmacy Consult for Cefepime Dosing, Electrolyte Management, Constipation Prevention Indication: HD/ Proteus Bacteremia     Allergies  Allergen Reactions  . Prednisone Anaphylaxis    Patient Measurements: Height: '5\' 7"'$  (170.2 cm) Weight: 285 lb 7.9 oz (129.5 kg) IBW/kg (Calculated) : 61.6   Vital Signs: Temp: 97.9 F (36.6 C) (02/03 1200) Temp Source: Core (Comment) (02/03 0400) BP: 157/58 mmHg (02/03 1200) Pulse Rate: 91 (02/03 1200) Intake/Output from previous day: 02/02 0701 - 02/03 0700 In: 950 [I.V.:150; NG/GT:700; IV Piggyback:100] Out: 4750 [Urine:4750] Intake/Output from this shift: Total I/O In: 50 [IV Piggyback:50] Out: 3150 [Urine:3150] Vent settings for last 24 hours: Vent Mode:  [-]  FiO2 (%):  [30 %-35 %] 35 %  Labs:  Recent Labs  10/28/15 0443 10/29/15 0421 10/29/15 1437 10/30/15 0431  WBC 26.2* 31.0*  --  29.4*  HGB 9.7* 9.4*  --  10.0*  HCT 29.9* 29.0*  --  30.8*  PLT 109* 132*  --  181  CREATININE 1.60* 1.40* 1.37* 1.33*  MG 2.3  --   --  2.1  PHOS  --   --   --  3.3   Estimated Creatinine Clearance: 56 mL/min (by C-G formula based on Cr of 1.33).   Recent Labs  10/30/15 0406 10/30/15 0815 10/30/15 1143  GLUCAP 152* 104* 130*    Microbiology: Recent Results (from the past 720 hour(s))  Urine culture     Status: None   Collection Time: 10/20/15  9:57 AM  Result Value Ref Range Status   Specimen Description URINE, RANDOM  Final   Special Requests NONE  Final   Culture >=100,000 COLONIES/mL PROTEUS MIRABILIS  Final   Report Status 10/22/2015 FINAL  Final   Organism ID, Bacteria PROTEUS MIRABILIS  Final      Susceptibility   Proteus mirabilis - MIC*    AMPICILLIN <=2 SENSITIVE Sensitive     GENTAMICIN <=1 SENSITIVE Sensitive     CEFTRIAXONE Value in next row Sensitive      SENSITIVE<=1    CIPROFLOXACIN Value in next row Sensitive      SENSITIVE<=0.25    IMIPENEM Value in next row  Sensitive      SENSITIVE2    NITROFURANTOIN Value in next row Resistant      RESISTANT128    TRIMETH/SULFA Value in next row Sensitive      SENSITIVE<=20    PIP/TAZO Value in next row Sensitive      SENSITIVE<=4    AMPICILLIN/SULBACTAM Value in next row Sensitive      SENSITIVE<=2    * >=100,000 COLONIES/mL PROTEUS MIRABILIS  Culture, blood (routine x 2)     Status: None   Collection Time: 10/20/15  9:57 AM  Result Value Ref Range Status   Specimen Description BLOOD RIGHT WRIST  Final   Special Requests   Final    BOTTLES DRAWN AEROBIC AND ANAEROBIC ANA 1ML AER 4ML   Culture  Setup Time   Final    GRAM NEGATIVE RODS IN BOTH AEROBIC AND ANAEROBIC BOTTLES CRITICAL RESULT CALLED TO, READ BACK BY AND VERIFIED WITH: MATT MCBANE ON 10/21/15 AT 0005 [facility] CONFIRMED BY [place]    Culture   Final    PROTEUS MIRABILIS IN BOTH AEROBIC AND ANAEROBIC BOTTLES    Report Status 10/22/2015 FINAL  Final   Organism ID, Bacteria PROTEUS MIRABILIS  Final      Susceptibility   Proteus mirabilis - MIC*    AMPICILLIN/SULBACTAM Value  in next row Sensitive      SENSITIVE<=2    PIP/TAZO Value in next row Sensitive      SENSITIVE<=4    CEFTRIAXONE Value in next row Sensitive      SENSITIVE<=1    IMIPENEM Value in next row Sensitive      SENSITIVE2    GENTAMICIN Value in next row Sensitive      SENSITIVE<=1    CIPROFLOXACIN Value in next row Sensitive      SENSITIVE<=0.25    * PROTEUS MIRABILIS  Culture, blood (routine x 2)     Status: None   Collection Time: 10/20/15  9:57 AM  Result Value Ref Range Status   Specimen Description BLOOD LEFT HAND  Final   Special Requests   Final    BOTTLES DRAWN AEROBIC AND ANAEROBIC AER 3ML ANA 2ML   Culture  Setup Time   Final    GRAM NEGATIVE RODS IN BOTH AEROBIC AND ANAEROBIC BOTTLES CRITICAL VALUE NOTED.  VALUE IS CONSISTENT WITH PREVIOUSLY REPORTED AND CALLED VALUE.    Culture   Final    PROTEUS MIRABILIS IN BOTH AEROBIC AND ANAEROBIC BOTTLES    Report  Status 10/22/2015 FINAL  Final   Organism ID, Bacteria PROTEUS MIRABILIS  Final      Susceptibility   Proteus mirabilis - MIC*    AMPICILLIN Value in next row Sensitive      SENSITIVE<=2    PIP/TAZO Value in next row Sensitive      SENSITIVE<=4    CEFTAZIDIME Value in next row Sensitive      SENSITIVE<=1    CEFTRIAXONE Value in next row Sensitive      SENSITIVE<=1    CEFEPIME Value in next row Sensitive      SENSITIVE<=1    IMIPENEM Value in next row Sensitive      SENSITIVE2    GENTAMICIN Value in next row Sensitive      SENSITIVE<=1    CIPROFLOXACIN Value in next row Sensitive      SENSITIVE<=0.25    * PROTEUS MIRABILIS  Blood Culture ID Panel (Reflexed)     Status: Abnormal   Collection Time: 10/20/15  9:57 AM  Result Value Ref Range Status   Enterococcus species NOT DETECTED NOT DETECTED Final   Listeria monocytogenes NOT DETECTED NOT DETECTED Final   Staphylococcus species NOT DETECTED NOT DETECTED Final   Staphylococcus aureus NOT DETECTED NOT DETECTED Final   Streptococcus species NOT DETECTED NOT DETECTED Final   Streptococcus agalactiae NOT DETECTED NOT DETECTED Final   Streptococcus pneumoniae NOT DETECTED NOT DETECTED Final   Streptococcus pyogenes NOT DETECTED NOT DETECTED Final   Acinetobacter baumannii NOT DETECTED NOT DETECTED Final   Enterobacteriaceae species DETECTED (A) NOT DETECTED Final    Comment: CRITICAL RESULT CALLED TO, READ BACK BY AND VERIFIED WITH: MATT MCBANE ON 10/21/15 AT 0005 [place]    Enterobacter cloacae complex NOT DETECTED NOT DETECTED Final   Escherichia coli NOT DETECTED NOT DETECTED Final   Klebsiella oxytoca NOT DETECTED NOT DETECTED Final   Klebsiella pneumoniae NOT DETECTED NOT DETECTED Final   Proteus species (A) NOT DETECTED Final    CRITICAL RESULT CALLED TO, READ BACK BY AND VERIFIED WITH:    Comment: MATT MCBANE ON 10/21/15 AT 0005 [place]   Serratia marcescens NOT DETECTED NOT DETECTED Final   Haemophilus influenzae NOT DETECTED NOT  DETECTED Final   Neisseria meningitidis NOT DETECTED NOT DETECTED Final   Pseudomonas aeruginosa NOT DETECTED NOT DETECTED Final  Candida albicans NOT DETECTED NOT DETECTED Final   Candida glabrata NOT DETECTED NOT DETECTED Final   Candida krusei NOT DETECTED NOT DETECTED Final   Candida parapsilosis NOT DETECTED NOT DETECTED Final   Candida tropicalis NOT DETECTED NOT DETECTED Final   Carbapenem resistance NOT DETECTED NOT DETECTED Final   Methicillin resistance NOT DETECTED NOT DETECTED Final   Vancomycin resistance NOT DETECTED NOT DETECTED Final  MRSA PCR Screening     Status: None   Collection Time: 10/20/15  7:41 PM  Result Value Ref Range Status   MRSA by PCR NEGATIVE NEGATIVE Final    Comment:        The GeneXpert MRSA Assay (FDA approved for NASAL specimens only), is one component of a comprehensive MRSA colonization surveillance program. It is not intended to diagnose MRSA infection nor to guide or monitor treatment for MRSA infections.   CULTURE, BLOOD (ROUTINE X 2) w Reflex to PCR ID Panel     Status: None   Collection Time: 10/22/15 11:39 AM  Result Value Ref Range Status   Specimen Description BLOOD LEFT HAND  Final   Special Requests BOTTLES DRAWN AEROBIC AND ANAEROBIC 1CC  Final   Culture NO GROWTH 5 DAYS  Final   Report Status 10/27/2015 FINAL  Final  Culture, expectorated sputum-assessment     Status: None   Collection Time: 10/22/15 12:00 PM  Result Value Ref Range Status   Specimen Description SPUTUM  Final   Special Requests Normal  Final   Sputum evaluation THIS SPECIMEN IS ACCEPTABLE FOR SPUTUM CULTURE  Final   Report Status 10/22/2015 FINAL  Final  Culture, respiratory (NON-Expectorated)     Status: None   Collection Time: 10/22/15 12:00 PM  Result Value Ref Range Status   Specimen Description SPUTUM  Final   Special Requests Normal Reflexed from [id number]  Final   Gram Stain   Final    FAIR SPECIMEN - 70-80% WBCS FEW WBC SEEN RARE YEAST RARE  GRAM NEGATIVE RODS    Culture LIGHT GROWTH ENTEROBACTER AEROGENES  Final   Report Status 10/24/2015 FINAL  Final   Organism ID, Bacteria ENTEROBACTER AEROGENES  Final      Susceptibility   Enterobacter aerogenes - MIC*    CEFAZOLIN >=64 RESISTANT Resistant     CEFEPIME <=1 SENSITIVE Sensitive     CEFTAZIDIME <=1 SENSITIVE Sensitive     CEFTRIAXONE <=1 SENSITIVE Sensitive     CIPROFLOXACIN <=0.25 SENSITIVE Sensitive     GENTAMICIN <=1 SENSITIVE Sensitive     IMIPENEM 2 SENSITIVE Sensitive     TRIMETH/SULFA <=20 SENSITIVE Sensitive     PIP/TAZO <=4 SENSITIVE Sensitive     * LIGHT GROWTH ENTEROBACTER AEROGENES  Urine culture     Status: None   Collection Time: 10/22/15 12:45 PM  Result Value Ref Range Status   Specimen Description URINE, RANDOM  Final   Special Requests NONE  Final   Culture NO GROWTH 2 DAYS  Final   Report Status 10/24/2015 FINAL  Final  Urine culture     Status: None   Collection Time: 10/28/15  2:36 PM  Result Value Ref Range Status   Specimen Description URINE, RANDOM  Final   Special Requests NONE  Final   Culture NO GROWTH 2 DAYS  Final   Report Status 10/30/2015 FINAL  Final    Medications:  Scheduled:  . amiodarone  400 mg Oral BID  . antiseptic oral rinse  7 mL Mouth Rinse 10 times per day  .  ceFEPime (MAXIPIME) IV  2 g Intravenous Q12H  . chlorhexidine gluconate  15 mL Mouth Rinse BID  . insulin aspart  0-15 Units Subcutaneous 6 times per day  . insulin detemir  13 Units Subcutaneous QHS  . ipratropium-albuterol  3 mL Nebulization Q6H  . pantoprazole sodium  40 mg Per Tube Q24H  . potassium chloride  40 mEq Oral Daily  . senna-docusate  1 tablet Oral BID   Infusions:  . dextrose 75 mL/hr at 10/30/15 1238  . norepinephrine (LEVOPHED) Adult infusion Stopped (10/25/15 2259)  . pureflow 2,000 mL/hr at 10/22/15 0104  . vasopressin (PITRESSIN) infusion - *FOR SHOCK* Stopped (10/23/15 6004)    Assessment: 70 y/o F with septic shock from Proteus  UTI. Patient with ARF recently transitioned from CRRT to HD not currently requiring HD.    Plan:  1. Will continue cefepime dosing  2 g iv q 12 hours for renal function.   2. Electrolytes: Electrolytes are wnl. Will recheck electrolytes with am labs.   3. Constipation: Patient with last BM 1/31. Will increase senna/docusate to 2 tab BID.    Pharmacy will continue to monitor and adjust per consult.    Ulice Dash, PharmD Clinical Pharmacist   10/30/2015

## 2015-10-30 NOTE — Progress Notes (Signed)
Palliative Medicine Inpatient Consult Follow Up Note   Name: April [name] Date: 10/30/2015 MRN: [id number]  DOB: [birth year]  Referring Physician: Henreitta Leber, MD  Palliative Care consult requested for this 70 y.o. female for goals of medical therapy in patient with poorly responsive state.    History:  Palliative Care consult requested for this 70 y.o. female for goals of medical therapy in patient with a patient who was found unresponsive by coworkers on Jan 24th. Downtime is unknown but she had not shown up for work for two days and they found her lying on the floor very minimally responsive. She has very limited history available as she has no known close relatives. She has an elderly uncle but he did not know of any medical history. I am told that her 'work family is like her real family'. 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 She was intubated by EMS and is currently undergoing a weaning trial.   She was started empirically on Zosyn and Vancomycin and given fluids for suspected sepsis with acute renal failure. She was started on dialysis but her kidney function has recovered. She has had a fever up to 101 degrees all day yesterday, but it is less than 100 today. Her lactic acid was 6.6 and she was treated for this along with the rhabdomyolysis secondary to her lying on the floor for an unknown time period. Blood cultures and urine culture grew Proteus Mirabilis and subsequently her sputum grew Enterobacter aerogenes. Now she is on Broadened ABX regimen and fever is now down. She has had atrial fibrillation with RVR and rate is controlled on Tube delivered amiodarone. She has some chronic mild right hydronephrosis seen on imaging.   Echo showed EF of between 55 and 65% but it was a poor quality echo. Urine drug screen was negative and alcohol level was negative. A CXR on 2/1 showed worsening of pulmonary edema.   UPDATE:  She  was extubated on 10/29/15 --but is not really 'waking up'. She follows a few simple commands from time to time but not consistently.  She has been examined by Neurologist, Dr. Alexis Goodell.  Her recommendations are to continue to address metabolic issues, check ammonia level, consider changing ABX, and check MRI of brain when able to do so.     PLAN: I have discussed with Critical Care attending and will place DNR order and DNR portable form in record.    IMPRESSION: Sepsis with septic shock ---Bld cxs growing Proteus Mirabilis ---Urine cx growing Proteus Mirabilis ---Likely due to UTI  Metabolic acidosis Acute Renal Failure due to ATN due to septic shock and rhabdomyolysis ---now resolved after CRRT (changed to short course of HD due to low platelets) ---Hydronephrosis on right is chronic  She has a nonobstructive right renal calculus  Acute respiratory failure due to being unable to protect her airway Severe Leukocytosis --due to sepsis  Enterobacter in Sputum with recent fever spike ---now on Cefipime for presumed resp infection A Fib with RVR with rate controlled now ---now off amiodarone drops and on oral amio Thrombocytopenia Hypokalemia Elevated Liver enzymes due to shock liver --improving DM2 --new onset with Hgb Y6A 7.7 Metabolic encephalopathy  ---only now attempting to respond to commands Elevated troponin --not felt to represent an MI (only demand ischemia) Twitching and tremors ---unclear etiology --possible neuro consult to follow Atelectasis --with possibilitiy of penumonia Fever 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    REVIEW OF SYSTEMS:  Patient is not able to provide ROS due to probably anoxic brain injury   CODE STATUS: DNR as of now.  I spoke with  her uncle who changed her to DNR after I explained code status and outcome data in basic terms he could follow.  He was appreciative of the information.  He is Media planner for pt.    PAST MEDICAL HISTORY:History reviewed. No pertinent past medical history.  PAST SURGICAL HISTORY: History reviewed. No pertinent past surgical history.  Vital Signs: BP 157/58 mmHg  Pulse 91  Temp(Src) 97.9 F (36.6 C) (Core (Comment))  Resp 30  Ht '5\' 7"'$  (1.702 m)  Wt 129.5 kg (285 lb 7.9 oz)  BMI 44.70 kg/m2  SpO2 96% Filed Weights   10/28/15 0500 10/29/15 0536 10/30/15 0500  Weight: 131.7 kg (290 lb 5.5 oz) 126.3 kg (278 lb 7.1 oz) 129.5 kg (285 lb 7.9 oz)    Estimated body mass index is 44.7 kg/(m^2) as calculated from the following:   Height as of this encounter: '5\' 7"'$  (1.702 m).   Weight as of this encounter: 129.5 kg (285 lb 7.9 oz).  PHYSICAL EXAM: Lethargic Bipap mask is a bit askew --I repositioned it and she winced but did not open eyes or follow my instructions to do so No JVD or TM Hrt rrr no mgr Lungs cta except for decreased BS in bases Abd soft and NT Ext both distal legs are in pressure relieving boots and I can only see her purple toes --all toes are purple to black  LABS: CBC:    Component Value Date/Time   WBC 29.4* 10/30/2015 0431   HGB 10.0* 10/30/2015 0431   HCT 30.8* 10/30/2015 0431   PLT 181 10/30/2015 0431   MCV 94.1 10/30/2015 0431   NEUTROABS 47.0* 10/20/2015 0957   LYMPHSABS 2.0 10/20/2015 0957   MONOABS 1.5* 10/20/2015 0957   EOSABS 0.0 10/20/2015 0957   BASOSABS 0.0 10/20/2015 0957   Comprehensive Metabolic Panel:    Component Value Date/Time   NA 157* 10/30/2015 0431   K 3.7 10/30/2015 0431   CL 121* 10/30/2015 0431   CO2 31 10/30/2015 0431   BUN 54* 10/30/2015 0431   CREATININE 1.33* 10/30/2015 0431   GLUCOSE 171* 10/30/2015 0431   CALCIUM 8.1* 10/30/2015 0431   AST 92* 10/26/2015 0400   ALT 174* 10/26/2015 0400   ALKPHOS 188* 10/26/2015 0400    BILITOT 2.1* 10/26/2015 0400   PROT 5.3* 10/26/2015 0400   ALBUMIN 1.7* 10/26/2015 0400     More than 50% of the visit was spent in counseling/coordination of care: YES  Time Spent:  35 min

## 2015-10-30 NOTE — Consult Note (Signed)
Reason for Consult:Altered mental status Referring Physician: Verdell Carmine  CC: Altered mental status  HPI: April [name] is an 70 y.o. female admitted on 10/20/15 after being found on the floor, minimally responsive.  Patient was intubated and started on Fentanyl and antibiotics due to septic shock.  Patient also noted to have ARF.  Patient extubated on yesterday and Fentanyl discontinued as of 1600 yesterday.  Despite this patient has not returned to baseline mental status.  Will not answer all questions related to orientation but does follow simple commands.    .    Past medical history: Unable to be obtained secondary to mental status  Past surgical history: Unable to be obtained secondary to mental status  Family history: Unable to be obtained secondary to mental status  Social History:  has no tobacco, alcohol, and drug history on file.  Allergies  Allergen Reactions  . Prednisone Anaphylaxis    Medications:  I have reviewed the patient's current medications. Prior to Admission:  No prescriptions prior to admission   Scheduled: . amiodarone  400 mg Oral BID  . antiseptic oral rinse  7 mL Mouth Rinse 10 times per day  . ceFEPime (MAXIPIME) IV  2 g Intravenous Q12H  . chlorhexidine gluconate  15 mL Mouth Rinse BID  . insulin aspart  0-15 Units Subcutaneous 6 times per day  . insulin detemir  13 Units Subcutaneous QHS  . ipratropium-albuterol  3 mL Nebulization Q6H  . pantoprazole sodium  40 mg Per Tube Q24H  . potassium chloride  40 mEq Oral Daily  . senna-docusate  1 tablet Oral BID    ROS: Unable to be obtained secondary to mental status  Physical Examination: Blood pressure 157/58, pulse 91, temperature 97.9 F (36.6 C), temperature source Core (Comment), resp. rate 30, height '5\' 7"'$  (1.702 m), weight 129.5 kg (285 lb 7.9 oz), SpO2 96 %.  HEENT-  Normocephalic, no lesions, without obvious abnormality.  Normal external eye and conjunctiva.  Normal TM's bilaterally.  Normal  auditory canals and external ears. Normal external nose, mucus membranes and septum.  Normal pharynx. Cardiovascular- S1, S2 normal, pulses palpable throughout   Lungs- chest clear, no wheezing, rales, normal symmetric air entry Abdomen- soft, non-tender; bowel sounds normal; no masses,  no organomegaly Extremities- edematous Lymph-no adenopathy palpable Musculoskeletal-no joint tenderness, deformity or swelling Skin-toes infarcted  Neurological Examination Mental Status: Alert.  Eyes open with BiPAP in place.  Follows simple commands.  No speech.   Cranial Nerves: II: Discs flat bilaterally; Visual fields grossly normal, pupils equal, round, reactive to light and accommodation III,IV, VI: ptosis not present, extra-ocular motions intact bilaterally V,VII: corneals intact bilaterally VIII: hearing normal bilaterally IX,X: gag reflex unable to be tested XI: bilateral shoulder shrug unable to be tested XII: patient on BiPAP Motor: Patient moves all extremities to command but does not lift any extremity off the bed.  Weak hand grip bilaterally.   Sensory: Responds to light touch in all extremities Deep Tendon Reflexes: 2+ in the upper extremities. Plantars: Right: mute   Left: mute Cerebellar: Unable to perform Gait: Unable to perform due to safety concerns    Laboratory Studies:   Basic Metabolic Panel:  Recent Labs Lab 10/24/15 0444  10/26/15 0400 10/27/15 0433 10/28/15 0443 10/29/15 0421 10/29/15 1437 10/30/15 0431  NA 138  < > 145 145 152* 154* 155* 157*  K 3.7  < > 3.2* 3.3* 3.9 4.1 4.8 3.7  CL 103  < > 110 110 119* 123*  121* 121*  CO2 26  < > '29 27 29 30 31 31  '$ GLUCOSE 238*  < > 232* 197* 209* 211* 212* 171*  BUN 91*  < > 89* 82* 71* 61* 59* 54*  CREATININE 2.95*  < > 2.11* 1.80* 1.60* 1.40* 1.37* 1.33*  CALCIUM 7.5*  < > 7.5* 7.3* 7.7* 7.8* 8.0* 8.1*  MG 2.3  --  2.3  --  2.3  --   --  2.1  PHOS 3.2  --  4.5  --   --   --   --  3.3  < > = values in this  interval not displayed.  Liver Function Tests:  Recent Labs Lab 10/24/15 0444 10/26/15 0400  AST  --  92*  ALT  --  174*  ALKPHOS  --  188*  BILITOT  --  2.1*  PROT  --  5.3*  ALBUMIN 1.7* 1.7*   No results for input(s): LIPASE, AMYLASE in the last 168 hours. No results for input(s): AMMONIA in the last 168 hours.  CBC:  Recent Labs Lab 10/26/15 0400 10/27/15 0433 10/28/15 0443 10/29/15 0421 10/30/15 0431  WBC 23.6* 27.6* 26.2* 31.0* 29.4*  HGB 10.7* 10.0* 9.7* 9.4* 10.0*  HCT 32.2* 30.4* 29.9* 29.0* 30.8*  MCV 89.3 91.0 93.0 92.1 94.1  PLT 59* 82* 109* 132* 181    Cardiac Enzymes: No results for input(s): CKTOTAL, CKMB, CKMBINDEX, TROPONINI in the last 168 hours.  BNP: Invalid input(s): POCBNP  CBG:  Recent Labs Lab 10/29/15 2040 10/30/15 0038 10/30/15 0406 10/30/15 0815 10/30/15 1143  GLUCAP 145* 159* 152* 104* 130*    Microbiology: Results for orders placed or performed during the hospital encounter of 10/20/15  Urine culture     Status: None   Collection Time: 10/20/15  9:57 AM  Result Value Ref Range Status   Specimen Description URINE, RANDOM  Final   Special Requests NONE  Final   Culture >=100,000 COLONIES/mL PROTEUS MIRABILIS  Final   Report Status 10/22/2015 FINAL  Final   Organism ID, Bacteria PROTEUS MIRABILIS  Final      Susceptibility   Proteus mirabilis - MIC*    AMPICILLIN <=2 SENSITIVE Sensitive     GENTAMICIN <=1 SENSITIVE Sensitive     CEFTRIAXONE Value in next row Sensitive      SENSITIVE<=1    CIPROFLOXACIN Value in next row Sensitive      SENSITIVE<=0.25    IMIPENEM Value in next row Sensitive      SENSITIVE2    NITROFURANTOIN Value in next row Resistant      RESISTANT128    TRIMETH/SULFA Value in next row Sensitive      SENSITIVE<=20    PIP/TAZO Value in next row Sensitive      SENSITIVE<=4    AMPICILLIN/SULBACTAM Value in next row Sensitive      SENSITIVE<=2    * >=100,000 COLONIES/mL PROTEUS MIRABILIS  Culture,  blood (routine x 2)     Status: None   Collection Time: 10/20/15  9:57 AM  Result Value Ref Range Status   Specimen Description BLOOD RIGHT WRIST  Final   Special Requests   Final    BOTTLES DRAWN AEROBIC AND ANAEROBIC ANA 1ML AER 4ML   Culture  Setup Time   Final    GRAM NEGATIVE RODS IN BOTH AEROBIC AND ANAEROBIC BOTTLES CRITICAL RESULT CALLED TO, READ BACK BY AND VERIFIED WITH: MATT MCBANE ON 10/21/15 AT 0005 [facility] CONFIRMED BY [place]    Culture  Final    PROTEUS MIRABILIS IN BOTH AEROBIC AND ANAEROBIC BOTTLES    Report Status 10/22/2015 FINAL  Final   Organism ID, Bacteria PROTEUS MIRABILIS  Final      Susceptibility   Proteus mirabilis - MIC*    AMPICILLIN/SULBACTAM Value in next row Sensitive      SENSITIVE<=2    PIP/TAZO Value in next row Sensitive      SENSITIVE<=4    CEFTRIAXONE Value in next row Sensitive      SENSITIVE<=1    IMIPENEM Value in next row Sensitive      SENSITIVE2    GENTAMICIN Value in next row Sensitive      SENSITIVE<=1    CIPROFLOXACIN Value in next row Sensitive      SENSITIVE<=0.25    * PROTEUS MIRABILIS  Culture, blood (routine x 2)     Status: None   Collection Time: 10/20/15  9:57 AM  Result Value Ref Range Status   Specimen Description BLOOD LEFT HAND  Final   Special Requests   Final    BOTTLES DRAWN AEROBIC AND ANAEROBIC AER 3ML ANA 2ML   Culture  Setup Time   Final    GRAM NEGATIVE RODS IN BOTH AEROBIC AND ANAEROBIC BOTTLES CRITICAL VALUE NOTED.  VALUE IS CONSISTENT WITH PREVIOUSLY REPORTED AND CALLED VALUE.    Culture   Final    PROTEUS MIRABILIS IN BOTH AEROBIC AND ANAEROBIC BOTTLES    Report Status 10/22/2015 FINAL  Final   Organism ID, Bacteria PROTEUS MIRABILIS  Final      Susceptibility   Proteus mirabilis - MIC*    AMPICILLIN Value in next row Sensitive      SENSITIVE<=2    PIP/TAZO Value in next row Sensitive      SENSITIVE<=4    CEFTAZIDIME Value in next row Sensitive      SENSITIVE<=1    CEFTRIAXONE Value in next row  Sensitive      SENSITIVE<=1    CEFEPIME Value in next row Sensitive      SENSITIVE<=1    IMIPENEM Value in next row Sensitive      SENSITIVE2    GENTAMICIN Value in next row Sensitive      SENSITIVE<=1    CIPROFLOXACIN Value in next row Sensitive      SENSITIVE<=0.25    * PROTEUS MIRABILIS  Blood Culture ID Panel (Reflexed)     Status: Abnormal   Collection Time: 10/20/15  9:57 AM  Result Value Ref Range Status   Enterococcus species NOT DETECTED NOT DETECTED Final   Listeria monocytogenes NOT DETECTED NOT DETECTED Final   Staphylococcus species NOT DETECTED NOT DETECTED Final   Staphylococcus aureus NOT DETECTED NOT DETECTED Final   Streptococcus species NOT DETECTED NOT DETECTED Final   Streptococcus agalactiae NOT DETECTED NOT DETECTED Final   Streptococcus pneumoniae NOT DETECTED NOT DETECTED Final   Streptococcus pyogenes NOT DETECTED NOT DETECTED Final   Acinetobacter baumannii NOT DETECTED NOT DETECTED Final   Enterobacteriaceae species DETECTED (A) NOT DETECTED Final    Comment: CRITICAL RESULT CALLED TO, READ BACK BY AND VERIFIED WITH: MATT MCBANE ON 10/21/15 AT 0005 [place]    Enterobacter cloacae complex NOT DETECTED NOT DETECTED Final   Escherichia coli NOT DETECTED NOT DETECTED Final   Klebsiella oxytoca NOT DETECTED NOT DETECTED Final   Klebsiella pneumoniae NOT DETECTED NOT DETECTED Final   Proteus species (A) NOT DETECTED Final    CRITICAL RESULT CALLED TO, READ BACK BY AND VERIFIED WITH:  Comment: MATT MCBANE ON 10/21/15 AT 0005 [place]   Serratia marcescens NOT DETECTED NOT DETECTED Final   Haemophilus influenzae NOT DETECTED NOT DETECTED Final   Neisseria meningitidis NOT DETECTED NOT DETECTED Final   Pseudomonas aeruginosa NOT DETECTED NOT DETECTED Final   Candida albicans NOT DETECTED NOT DETECTED Final   Candida glabrata NOT DETECTED NOT DETECTED Final   Candida krusei NOT DETECTED NOT DETECTED Final   Candida parapsilosis NOT DETECTED NOT DETECTED Final    Candida tropicalis NOT DETECTED NOT DETECTED Final   Carbapenem resistance NOT DETECTED NOT DETECTED Final   Methicillin resistance NOT DETECTED NOT DETECTED Final   Vancomycin resistance NOT DETECTED NOT DETECTED Final  MRSA PCR Screening     Status: None   Collection Time: 10/20/15  7:41 PM  Result Value Ref Range Status   MRSA by PCR NEGATIVE NEGATIVE Final    Comment:        The GeneXpert MRSA Assay (FDA approved for NASAL specimens only), is one component of a comprehensive MRSA colonization surveillance program. It is not intended to diagnose MRSA infection nor to guide or monitor treatment for MRSA infections.   CULTURE, BLOOD (ROUTINE X 2) w Reflex to PCR ID Panel     Status: None   Collection Time: 10/22/15 11:39 AM  Result Value Ref Range Status   Specimen Description BLOOD LEFT HAND  Final   Special Requests BOTTLES DRAWN AEROBIC AND ANAEROBIC 1CC  Final   Culture NO GROWTH 5 DAYS  Final   Report Status 10/27/2015 FINAL  Final  Culture, expectorated sputum-assessment     Status: None   Collection Time: 10/22/15 12:00 PM  Result Value Ref Range Status   Specimen Description SPUTUM  Final   Special Requests Normal  Final   Sputum evaluation THIS SPECIMEN IS ACCEPTABLE FOR SPUTUM CULTURE  Final   Report Status 10/22/2015 FINAL  Final  Culture, respiratory (NON-Expectorated)     Status: None   Collection Time: 10/22/15 12:00 PM  Result Value Ref Range Status   Specimen Description SPUTUM  Final   Special Requests Normal Reflexed from [id number]  Final   Gram Stain   Final    FAIR SPECIMEN - 70-80% WBCS FEW WBC SEEN RARE YEAST RARE GRAM NEGATIVE RODS    Culture LIGHT GROWTH ENTEROBACTER AEROGENES  Final   Report Status 10/24/2015 FINAL  Final   Organism ID, Bacteria ENTEROBACTER AEROGENES  Final      Susceptibility   Enterobacter aerogenes - MIC*    CEFAZOLIN >=64 RESISTANT Resistant     CEFEPIME <=1 SENSITIVE Sensitive     CEFTAZIDIME <=1 SENSITIVE Sensitive      CEFTRIAXONE <=1 SENSITIVE Sensitive     CIPROFLOXACIN <=0.25 SENSITIVE Sensitive     GENTAMICIN <=1 SENSITIVE Sensitive     IMIPENEM 2 SENSITIVE Sensitive     TRIMETH/SULFA <=20 SENSITIVE Sensitive     PIP/TAZO <=4 SENSITIVE Sensitive     * LIGHT GROWTH ENTEROBACTER AEROGENES  Urine culture     Status: None   Collection Time: 10/22/15 12:45 PM  Result Value Ref Range Status   Specimen Description URINE, RANDOM  Final   Special Requests NONE  Final   Culture NO GROWTH 2 DAYS  Final   Report Status 10/24/2015 FINAL  Final  Urine culture     Status: None   Collection Time: 10/28/15  2:36 PM  Result Value Ref Range Status   Specimen Description URINE, RANDOM  Final   Special Requests NONE  Final   Culture NO GROWTH 2 DAYS  Final   Report Status 10/30/2015 FINAL  Final    Coagulation Studies: No results for input(s): LABPROT, INR in the last 72 hours.  Urinalysis:  Recent Labs Lab 10/28/15 1437  COLORURINE YELLOW*  LABSPEC 1.014  PHURINE 5.0  GLUCOSEU 50*  HGBUR 1+*  BILIRUBINUR NEGATIVE  KETONESUR NEGATIVE  PROTEINUR 30*  NITRITE NEGATIVE  LEUKOCYTESUR TRACE*    Lipid Panel:     Component Value Date/Time   CHOL 108 10/22/2015 0503   TRIG 253* 10/23/2015 1339   HDL <5* 10/22/2015 0503   CHOLHDL UNABLE TO CALCULATE IF TRIGLYCERIDE OVER 400 mg/dL 10/22/2015 0503   VLDL UNABLE TO CALCULATE IF TRIGLYCERIDE OVER 400 mg/dL 10/22/2015 0503   LDLCALC UNABLE TO CALCULATE IF TRIGLYCERIDE OVER 400 mg/dL 10/22/2015 0503    HgbA1C:  Lab Results  Component Value Date   HGBA1C 7.7* 10/21/2015    Urine Drug Screen:     Component Value Date/Time   LABOPIA NONE DETECTED 10/20/2015 1941   LABBENZ NONE DETECTED 10/20/2015 1941   AMPHETMU NONE DETECTED 10/20/2015 1941   THCU NONE DETECTED 10/20/2015 1941   LABBARB NONE DETECTED 10/20/2015 1941    Alcohol Level: No results for input(s): ETH in the last 168 hours.  Other results: EKG: sinus tachycardia at 126  bpm.  Imaging: Dg Chest 1 View  10/30/2015  CLINICAL DATA:  Dyspnea, sepsis, acute renal failure, acute MI. EXAM: CHEST 1 VIEW COMPARISON:  Portable chest x-ray of October 29, 2015 FINDINGS: There has been interval extubation of the trachea and of the esophagus. The lungs are borderline hypoinflated. The pulmonary interstitial markings remain increased bilaterally. The cardiac silhouette remains enlarged. The pulmonary vascularity remains engorged but is slightly more distinct. The hemidiaphragms are partially obscured likely due to small amounts of pleural fluid especially on the left. The right internal jugular catheter tip projects over the distal third of the SVC. The smaller caliber left internal jugular catheter projects over the midportion of the SVC. IMPRESSION: Interval extubation of the trachea and esophagus. Persistent pulmonary interstitial edema and likely small bilateral pleural effusions. Slight interval improvement in pulmonary vascular congestion. Electronically Signed   By: David  Martinique M.D.   On: 10/30/2015 07:24   Dg Chest 1 View  10/29/2015  CLINICAL DATA:  Dyspnea EXAM: CHEST 1 VIEW COMPARISON:  10/28/2015 FINDINGS: Endotracheal tube in good position. Right jugular central venous catheter tip at the cavoatrial atrial junction. Left jugular central venous catheter tip at the cavoatrial junction. NG tube in place with tip not visualized due to underpenetration Diffuse bilateral airspace disease is unchanged. Slightly improved lung volume. Bibasilar atelectasis and small effusions. IMPRESSION: Support lines remain in good position Diffuse bilateral airspace disease unchanged, most consistent with pulmonary edema. Slightly improved lung volume. Electronically Signed   By: Franchot Gallo M.D.   On: 10/29/2015 07:32     Assessment/Plan: 70 year old female admitted with septic shock requiring intubation.  Now extubated with no return to baseline mental status.  Patient with multi-organ  involvement noted-ARF and hepatic injury noted.  On Cefepime.  Patient also hypernatremic.  All of these factors may be contributing to mental status.  Extremities show evidence of embolization.  Can not rule out the possibility of some embolization to the brain.  Further work up recommended.  Recommendations: 1.  Agree with addressing metabolic issues 2.  Serum ammonia 3.  Consider changing antibiotics-Cefepime may cause mental status changes 4.  MRI of the  brain without contrast once patient stable enough from a pulmonary standpoint.     Alexis Goodell, MD Neurology [phone] 10/30/2015, 1:39 PM

## 2015-10-30 NOTE — Progress Notes (Addendum)
Nutrition Follow-up   INTERVENTION:   Coordination of Care: will await diet progression as able s/p extubation Medical Food Supplement Therapy: pt will likely benefit from supplement once diet order able to be advanced   NUTRITION DIAGNOSIS:   Inadequate oral intake related to acute illness as evidenced by NPO status.  GOAL:   Provide needs based on ASPEN/SCCM guidelines  MONITOR:    (Energy Intake, Pulmonary, Digestive System, Electrolyte/Renal Profile, Glucose Profile)  REASON FOR ASSESSMENT:   Ventilator    ASSESSMENT:    Pt s/p extubation this am, currently on bipap this am on rounds. Pt with hypernatremia, nephrology following; was receiving 287m q 2hours of free water with TF via OG tube yesterday.   Diet Order:  Diet NPO time specified    Current Nutrition: Pt was tolerating Vital 1.2 AF at 519mhr prior to extubation. Pt remains NPO at this time    Gastrointestinal Profile: Last BM: 10/29/2015 loose brown smear Output/UOP: [id number] last 24 hours   Scheduled Medications:  . amiodarone  400 mg Oral BID  . antiseptic oral rinse  7 mL Mouth Rinse 10 times per day  . ceFEPime (MAXIPIME) IV  2 g Intravenous Q12H  . chlorhexidine gluconate  15 mL Mouth Rinse BID  . insulin aspart  0-15 Units Subcutaneous 6 times per day  . insulin detemir  13 Units Subcutaneous QHS  . ipratropium-albuterol  3 mL Nebulization Q6H  . oxyCODONE  5 mg Oral Q6H  . pantoprazole sodium  40 mg Per Tube Q24H  . potassium chloride  40 mEq Oral Daily  . senna-docusate  1 tablet Oral BID    Continuous Medications:  . norepinephrine (LEVOPHED) Adult infusion Stopped (10/25/15 2259)  . pureflow 2,000 mL/hr at 10/22/15 0104  . vasopressin (PITRESSIN) infusion - *FOR SHOCK* Stopped (10/23/15 0950)     Electrolyte/Renal Profile and Glucose Profile:   Recent Labs Lab 10/24/15 0444  10/26/15 0400  10/28/15 0443 10/29/15 0421 10/29/15 1437 10/30/15 0431  NA 138  < > 145  < >  152* 154* 155* 157*  K 3.7  < > 3.2*  < > 3.9 4.1 4.8 3.7  CL 103  < > 110  < > 119* 123* 121* 121*  CO2 26  < > 29  < > '29 30 31 31  '$ BUN 91*  < > 89*  < > 71* 61* 59* 54*  CREATININE 2.95*  < > 2.11*  < > 1.60* 1.40* 1.37* 1.33*  CALCIUM 7.5*  < > 7.5*  < > 7.7* 7.8* 8.0* 8.1*  MG 2.3  --  2.3  --  2.3  --   --  2.1  PHOS 3.2  --  4.5  --   --   --   --  3.3  GLUCOSE 238*  < > 232*  < > 209* 211* 212* 171*  < > = values in this interval not displayed. Protein Profile:  Recent Labs Lab 10/24/15 0444 10/26/15 0400  ALBUMIN 1.7* 1.7*    Weight Trend since Admission: Filed Weights   10/28/15 0500 10/29/15 0536 10/30/15 0500  Weight: 290 lb 5.5 oz (131.7 kg) 278 lb 7.1 oz (126.3 kg) 285 lb 7.9 oz (129.5 kg)     Skin:  Reviewed, no issues    Ideal Body Weight:   61kg  BMI:  Body mass index is 44.7 kg/(m^2).  Estimated Nutritional Needs:   Kcal:  [id number] (11-14 kcals/kg) using wt of 122.9 kg  Protein:  122-153 g (2.0-2.5 g/kg) using IBW 61 kg  Fluid:  1525-1830 mL (25-30 ml/kg)   EDUCATION NEEDS:   No education needs identified at this time   [place], RD, LDN Pager [phone] Weekend/On-Call Pager [phone]

## 2015-10-30 NOTE — Progress Notes (Signed)
[facility] Progess Note    ASSESSMENT/PLAN   Severe septic shock due to UTI Proteus/sepsis.   PULMONARY OETT 7.44m Respiratory failure - hypoxic Septic shock -slowly improving Reviewed CT and chest x-ray images reports, appears to be his suggestive of bibasilar atelectasis with small bibasilar effusions, possibility of pneumonia, particularly in light of continued fevers.  P:  ABG this a.m. ABG 7.5, 2/40/57/32.7, consistent with contraction alkalosis. -Chest x-ray 10/29/15. Images reviewed, continues to show bibasilar atelectasis. -Continue with diuresis.  DVT prophylaxis with Lovenox.  INFECTIOUS Sepsis - Proteus UTI and Bacteremia No further fevers overnight P:  - cont with current abx   Ceftriaxone 10/26/2015>> February 1 Cefepime February 1 17>>   Sputum culture 10/22/2015 positive for Enterobacter. Urine culture 10/22/2015 negative thus far. Blood culture 10/22/2015: Negative. Urine culture 10/20/2015: Positive for Proteus mirabilis..Marland KitchenMRSA screen negative  CARDIOVASCULAR CVL - RIJ VASCATH, LIJ CVL Shock - septic, hypotension, improved, patient has been weaned off of pressors. NSTEMI Afib - started on amiodarone 1/24  P:  - maintain MAP >65 - vasopressors as needed - currently weaned off.  - monitor BP - trend CE - heparin gtt on hold due to low plt - mostly likely demand ischemia - EKG reviewed, no sig ST changes.  - cont with amiodarone - cardiology consult appreciated.   RENAL  Status post ARF-kidney function continues to improve, with decreasing creatinine and good urine output. UTI Mild hypernatremia, patient was transitioned to one third normal saline, she is not able to take orals. In addition, due to hyperglycemia. I did not start D5 water. Right hydronephrosis Proteus Bacteremia, Proteus UTI, CT abdomen reviewed-no evidence of abdominal abscess. P:  -monitor Cr, urine output and creatinine  improving  GASTROINTESTINAL Tube feeds with vital- PPI Elevated LFTs - trend   HEMATOLOGIC Leukocytosis Thrombocytopenia  P:  -Heparin was held, platelets increased  today.   ENDOCRINE ICU hypo/hyperglycemia protocol -Blood glucose improved this morning, currently on 13 units of Levemir, and moderate dose correction.  NEUROLOGIC A: Acute encephalopathy P:  RASS goal: -1 - metabolic encephalopathy, improved this morning.  - cont to monitor neuro status.     ---------------------------------------   ----------------------------------------   Name: April BERKEMEIERMRN: [id number]: [birth year]   ADMISSION DATE:  10/20/2015    CHIEF COMPLAINT:  dyspnea    SUBJECTIVE:   Pt currently on the ventilator, can not provide history or review of systems.    VITAL SIGNS: Temp:  [99.5 F (37.5 C)-100 F (37.8 C)] 99.9 F (37.7 C) (02/03 0900) Pulse Rate:  [84-100] 89 (02/03 0900) Resp:  [23-36] 31 (02/03 0900) BP: (74-156)/(44-87) 156/57 mmHg (02/03 0900) SpO2:  [95 %-100 %] 98 % (02/03 0900) FiO2 (%):  [30 %-40 %] 35 % (02/03 0814) Weight:  [285 lb 7.9 oz (129.5 kg)] 285 lb 7.9 oz (129.5 kg) (02/03 0500) HEMODYNAMICS:   VENTILATOR SETTINGS: Vent Mode:  [-] Spontaneous FiO2 (%):  [30 %-40 %] 35 % PEEP:  [5 cmH20] 5 cmH20 Pressure Support:  [5 cmH20] 5 cmH20 INTAKE / OUTPUT:  Intake/Output Summary (Last 24 hours) at 10/30/15 1214 Last data filed at 10/30/15 0800  Gross per 24 hour  Intake    300 ml  Output   5200 ml  Net  -4900 ml    PHYSICAL EXAMINATION: Physical Examination:   VS: BP 156/57 mmHg  Pulse 89  Temp(Src) 99.9 F (37.7 C) (Core (Comment))  Resp 31  Ht '5\' 7"'$  (1.702 m)  Wt  285 lb 7.9 oz (129.5 kg)  BMI 44.70 kg/m2  SpO2 98%  General Appearance: No distress  Neuro:without focal findings,  HEENT: PERRLA, EOM intact. Pulmonary: normal breath sounds   CardiovascularNormal S1,S2.  No m/r/g.   Abdomen: Benign, Soft,  non-tender. Renal:  No costovertebral tenderness  GU:  Not performed at this time. Endocrine: No evident thyromegaly. Skin:   warm, no rashes, no ecchymosis  Extremities: normal, no cyanosis, clubbing.   LABS:   LABORATORY PANEL:   CBC  Recent Labs Lab 10/30/15 0431  WBC 29.4*  HGB 10.0*  HCT 30.8*  PLT 181    Chemistries   Recent Labs Lab 10/26/15 0400  10/30/15 0431  NA 145  < > 157*  K 3.2*  < > 3.7  CL 110  < > 121*  CO2 29  < > 31  GLUCOSE 232*  < > 171*  BUN 89*  < > 54*  CREATININE 2.11*  < > 1.33*  CALCIUM 7.5*  < > 8.1*  MG 2.3  < > 2.1  PHOS 4.5  --  3.3  AST 92*  --   --   ALT 174*  --   --   ALKPHOS 188*  --   --   BILITOT 2.1*  --   --   < > = values in this interval not displayed.   Recent Labs Lab 10/29/15 1559 10/29/15 2040 10/30/15 0038 10/30/15 0406 10/30/15 0815 10/30/15 1143  GLUCAP 168* 145* 159* 152* 104* 130*    Recent Labs Lab 10/29/15 0350 10/29/15 1232 10/30/15 0438  PHART 7.43 7.34* 7.52*  PCO2ART 48 58* 40  PO2ART 81* 70* 57*    Recent Labs Lab 10/24/15 0444 10/26/15 0400  AST  --  92*  ALT  --  174*  ALKPHOS  --  188*  BILITOT  --  2.1*  ALBUMIN 1.7* 1.7*    Cardiac Enzymes No results for input(s): TROPONINI in the last 168 hours.  RADIOLOGY:  Dg Chest 1 View  10/30/2015  CLINICAL DATA:  Dyspnea, sepsis, acute renal failure, acute MI. EXAM: CHEST 1 VIEW COMPARISON:  Portable chest x-ray of October 29, 2015 FINDINGS: There has been interval extubation of the trachea and of the esophagus. The lungs are borderline hypoinflated. The pulmonary interstitial markings remain increased bilaterally. The cardiac silhouette remains enlarged. The pulmonary vascularity remains engorged but is slightly more distinct. The hemidiaphragms are partially obscured likely due to small amounts of pleural fluid especially on the left. The right internal jugular catheter tip projects over the distal third of the SVC. The smaller  caliber left internal jugular catheter projects over the midportion of the SVC. IMPRESSION: Interval extubation of the trachea and esophagus. Persistent pulmonary interstitial edema and likely small bilateral pleural effusions. Slight interval improvement in pulmonary vascular congestion. Electronically Signed   By: David  Martinique M.D.   On: 10/30/2015 07:24   Dg Chest 1 View  10/29/2015  CLINICAL DATA:  Dyspnea EXAM: CHEST 1 VIEW COMPARISON:  10/28/2015 FINDINGS: Endotracheal tube in good position. Right jugular central venous catheter tip at the cavoatrial atrial junction. Left jugular central venous catheter tip at the cavoatrial junction. NG tube in place with tip not visualized due to underpenetration Diffuse bilateral airspace disease is unchanged. Slightly improved lung volume. Bibasilar atelectasis and small effusions. IMPRESSION: Support lines remain in good position Diffuse bilateral airspace disease unchanged, most consistent with pulmonary edema. Slightly improved lung volume. Electronically Signed   By: Franchot Gallo M.D.  On: 10/29/2015 07:32       --Marda Stalker, MD.  Pager [phone] [facility] Office Number: [phone]  Patricia Pesa, M.D.  Vilinda Boehringer, M.D.  Merton Border, M.D  [place].  I have personally obtained a history, examined the patient, evaluated laboratory and imaging results, formulated the assessment and plan and placed orders. The Patient requires high complexity decision making for assessment and support, frequent evaluation and titration of therapies, application of advanced monitoring technologies and extensive interpretation of multiple databases. The patient has critical illness that could lead imminently to failure of 1 or more organ systems and requires the highest level of physician preparedness to intervene.  Critical Care Time devoted to patient care services described in this note is 35 minutes and is  exclusive of time spent in procedures.

## 2015-10-30 NOTE — Progress Notes (Signed)
Pt's work family called, concenred about the information being received from the family. They informed the nurse that the patients uncle, who has been making decisions, has a diagnosis of dementia and is not understanding the discussions with the physicians.

## 2015-10-30 NOTE — Progress Notes (Signed)
[place] [facility] [place] NAME: April [name]    MR#:  [id number]  DATE OF BIRTH:  August 27, 1946  SUBJECTIVE:   Pt. Extubated yesterday and follows simple commands.  On Bipap.  Awaiting speech eval.    REVIEW OF SYSTEMS:   Review of Systems  Unable to perform ROS: mental acuity   Tolerating Diet: Await Speech Eval  DRUG ALLERGIES:   Allergies  Allergen Reactions  . Prednisone Anaphylaxis    VITALS:  Blood pressure 157/58, pulse 91, temperature 97.9 F (36.6 C), temperature source Core (Comment), resp. rate 30, height '5\' 7"'$  (1.702 m), weight 129.5 kg (285 lb 7.9 oz), SpO2 96 %.  PHYSICAL EXAMINATION:   Physical Exam   GENERAL:  70 y.o.-year-old patient lying in the bed on Bipap and follows simple commands.  EYES: Pupils equal, round, reactive to light. (-) scleral icterus.  HEENT: Head atraumatic, normocephalic. Dry Oral Mucosa.  NECK:  Supple, no jugular venous distention. No thyroid enlargement, no tenderness.  LUNGS: Positive use of accessory muscles. No rales, rhonchi, wheezing.  CARDIOVASCULAR: S1, S2 normal. No murmurs, rubs, or gallops.  ABDOMEN: Soft, nontender, nondistended. Bowel sounds present. No organomegaly or mass. + foley with yellow urine draining.  EXTREMITIES: Cyanotic toe's b/l.  Faint pulses b/l.   NEUROLOGIC: Globally weak, follows simple commands.  PSYCHIATRIC: Flat affect and difficult to assess due to underlying mental status.  SKIN: No rashes, lesions, masses.  LABORATORY PANEL:  CBC  Recent Labs Lab 10/30/15 0431  WBC 29.4*  HGB 10.0*  HCT 30.8*  PLT 181    Chemistries   Recent Labs Lab 10/26/15 0400  10/30/15 0431  NA 145  < > 157*  K 3.2*  < > 3.7  CL 110  < > 121*  CO2 29  < > 31  GLUCOSE 232*  < > 171*  BUN 89*  < > 54*  CREATININE 2.11*  < > 1.33*  CALCIUM 7.5*  < > 8.1*  MG 2.3  < > 2.1  AST 92*  --   --   ALT 174*  --   --   ALKPHOS 188*  --   --   BILITOT 2.1*  --   --    < > = values in this interval not displayed. Cardiac Enzymes No results for input(s): TROPONINI in the last 168 hours. RADIOLOGY:  Dg Chest 1 View  10/30/2015  CLINICAL DATA:  Dyspnea, sepsis, acute renal failure, acute MI. EXAM: CHEST 1 VIEW COMPARISON:  Portable chest x-ray of October 29, 2015 FINDINGS: There has been interval extubation of the trachea and of the esophagus. The lungs are borderline hypoinflated. The pulmonary interstitial markings remain increased bilaterally. The cardiac silhouette remains enlarged. The pulmonary vascularity remains engorged but is slightly more distinct. The hemidiaphragms are partially obscured likely due to small amounts of pleural fluid especially on the left. The right internal jugular catheter tip projects over the distal third of the SVC. The smaller caliber left internal jugular catheter projects over the midportion of the SVC. IMPRESSION: Interval extubation of the trachea and esophagus. Persistent pulmonary interstitial edema and likely small bilateral pleural effusions. Slight interval improvement in pulmonary vascular congestion. Electronically Signed   By: David  Martinique M.D.   On: 10/30/2015 07:24   Dg Chest 1 View  10/29/2015  CLINICAL DATA:  Dyspnea EXAM: CHEST 1 VIEW COMPARISON:  10/28/2015 FINDINGS: Endotracheal tube in good position. Right jugular central venous catheter tip at the  cavoatrial atrial junction. Left jugular central venous catheter tip at the cavoatrial junction. NG tube in place with tip not visualized due to underpenetration Diffuse bilateral airspace disease is unchanged. Slightly improved lung volume. Bibasilar atelectasis and small effusions. IMPRESSION: Support lines remain in good position Diffuse bilateral airspace disease unchanged, most consistent with pulmonary edema. Slightly improved lung volume. Electronically Signed   By: Franchot Gallo M.D.   On: 10/29/2015 07:32   ASSESSMENT AND PLAN:   70 year old female presented to  the emergency room after she was found lying on the floor with minimal responsiveness.  1. Severe Septic shock, hypovolemic shock: due to proteus bacteremia, from UTI - off vasopressors now but spiked a fever of 101 1/31.  Sputum Cx + for Enterobacter.   - cont. IV Cefepime. No fever overnight and will follow fever curve.  -  blood and urine culture were + for Proteus Mirabilis.    2. Acute renal failure: - is due to ATN in the setting of sepsis and rhabdomyolysis.  - was on CRRT but it was stopped due to thrombocytopenia.  Had short course of HD 1/28 - urine output is improving 2.8 L in the past 24 hrs.  - no acute indication for HD and cont. Care as per Nephro.  - pt. Was noted to have RIGHT hydronephrosis question UPJ obstruction on CT Scan abd/pelvis on admission and repeat CT abd/pelvis 1/31 showing the same. No acute intervention presently.    3. Severe leukocytosis: Likely due to underlying sepsis and will cont. To monitor.   4. Atrial fibrillation with RVR - rate controlled now.  Off amio now and will cont. To monitor.   - Echo showing normal LV function w/ EF of 55%.    5. DM-2 , new onset - HgA1c of 7.7 - No further episodes of hypoglycemia. Continue insulin every 4 hours and also sliding scale insulin for now. BS stable.   6. Elevated transaminases and jaundice - due to Shock liver.  LFT's improving and will cont. To monitor.   7. Acute rhabdomyolysis secondary to patient being found down on the floor:  - CPK's have trended down. Resolved.   8. 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 Was + but improving.  No acute bleeding.  - came in with 62---46--23--12--14--> 16-->36-->59-->82-->109--> 132-->180 today.   9. Hypokalemia -  Resolved w/ supplementation.   10. AMS - etiology unclear.  Pt. Was down for prolonged period of time prior to admission but did not have cardiac arrest.  CT head on admission (-) and repeated 1/31 and it is (-) again.   - appreciate Neuro consult and  will get MRI Brain when stable.   11. Hypernatremia - started on D5W and follow sodium  Appreciate Palliative care input and pt. Is now a DNR.   CODE STATUS: DNR  DVT Prophylaxis: SCD/TED  TOTAL critical TIME TAKING CARE OF THIS PATIENT: 30 minutes.   Note: This dictation was prepared with Dragon dictation along with smaller phrase technology. Any transcriptional errors that result from this process are unintentional.  Henreitta Leber M.D on 10/30/2015 at 4:38 PM  Between 7am to 6pm - Pager - [phone]  After 6pm go to [email or web address] - password [email or web address] [facility]  Office  [phone]  CC: Primary care physician; Albina Billet, MD

## 2015-10-30 NOTE — Progress Notes (Signed)
70 y/o F on oral amiodarone for afib. Patient is in NSR. Per palliative care, it is unsafe to give oral medications at this time. Called cardiology to get an opinion about what to do at this point. Cardiologist was not familiar with the patient but advised not to resume amiodarone infusion, but instead to see how patient does off amiodarone until it is feasible to resume po amiodarone.

## 2015-10-30 NOTE — Progress Notes (Signed)
Patient taken off bipap to assess respiratory status. RR 30's same as on bipap. Oxygenating well on 3liter nasal cannual. svn given. Will continue to monitor

## 2015-10-30 NOTE — Progress Notes (Signed)
[facility]  ROUNDING NOTE   Subjective:   Last HD was Saturday Now, UOP > 4700 Extubated yesterday. Currently requiring BiPAP   Eyes are open. Able to follow simple commands Serum creatinine has further improved however, sodium level has worsened today despite free water supplementation  Objective:  Vital signs in last 24 hours:  Temp:  [99.1 F (37.3 C)-100 F (37.8 C)] 99.9 F (37.7 C) (02/03 0900) Pulse Rate:  [84-100] 89 (02/03 0900) Resp:  [19-45] 31 (02/03 0900) BP: (74-156)/(44-87) 156/57 mmHg (02/03 0900) SpO2:  [95 %-100 %] 98 % (02/03 0900) FiO2 (%):  [30 %-40 %] 35 % (02/03 0814) Weight:  [129.5 kg (285 lb 7.9 oz)] 129.5 kg (285 lb 7.9 oz) (02/03 0500)  Weight change: 3.2 kg (7 lb 0.9 oz) Filed Weights   10/28/15 0500 10/29/15 0536 10/30/15 0500  Weight: 131.7 kg (290 lb 5.5 oz) 126.3 kg (278 lb 7.1 oz) 129.5 kg (285 lb 7.9 oz)    Intake/Output: I/O last 3 completed shifts: In: 2600 [I.V.:270; NG/GT:2180; IV Piggyback:150] Out: 6300 [Urine:6300]   Intake/Output this shift:  Total I/O In: -  Out: 1400 [Urine:1400]  Physical Exam: General: Critically ill appearing   Head: normocephalic  Eyes: Eyes open   Neck: No masses  Lungs:   tachypnea, BiPAP   Heart: Regular, no rub   Abdomen:  Soft, nontender, obese  Extremities: 3+ peripheral edema. +anasarca  Neurologic:  sedated, able to follow simple commands   Skin: Feet in soft support, necrosis in toes  Access: RIJ vascath 10/20/15 Dr. Stevenson Clinch    Basic Metabolic Panel:  Recent Labs Lab 10/24/15 0444  10/26/15 0400 10/27/15 0433 10/28/15 0443 10/29/15 0421 10/29/15 1437 10/30/15 0431  NA 138  < > 145 145 152* 154* 155* 157*  K 3.7  < > 3.2* 3.3* 3.9 4.1 4.8 3.7  CL 103  < > 110 110 119* 123* 121* 121*  CO2 26  < > '29 27 29 30 31 31  '$ GLUCOSE 238*  < > 232* 197* 209* 211* 212* 171*  BUN 91*  < > 89* 82* 71* 61* 59* 54*  CREATININE 2.95*  < > 2.11* 1.80* 1.60* 1.40* 1.37* 1.33*   CALCIUM 7.5*  < > 7.5* 7.3* 7.7* 7.8* 8.0* 8.1*  MG 2.3  --  2.3  --  2.3  --   --  2.1  PHOS 3.2  --  4.5  --   --   --   --  3.3  < > = values in this interval not displayed.  Liver Function Tests:  Recent Labs Lab 10/24/15 0444 10/26/15 0400  AST  --  92*  ALT  --  174*  ALKPHOS  --  188*  BILITOT  --  2.1*  PROT  --  5.3*  ALBUMIN 1.7* 1.7*   No results for input(s): LIPASE, AMYLASE in the last 168 hours. No results for input(s): AMMONIA in the last 168 hours.  CBC:  Recent Labs Lab 10/26/15 0400 10/27/15 0433 10/28/15 0443 10/29/15 0421 10/30/15 0431  WBC 23.6* 27.6* 26.2* 31.0* 29.4*  HGB 10.7* 10.0* 9.7* 9.4* 10.0*  HCT 32.2* 30.4* 29.9* 29.0* 30.8*  MCV 89.3 91.0 93.0 92.1 94.1  PLT 59* 82* 109* 132* 181    Cardiac Enzymes: No results for input(s): CKTOTAL, CKMB, CKMBINDEX, TROPONINI in the last 168 hours.  BNP: Invalid input(s): POCBNP  CBG:  Recent Labs Lab 10/29/15 1559 10/29/15 2040 10/30/15 0038 10/30/15 0406 10/30/15 9675  [place]    Microbiology: Results for orders placed or performed during the hospital encounter of 10/20/15  Urine culture     Status: None   Collection Time: 10/20/15  9:57 AM  Result Value Ref Range Status   Specimen Description URINE, RANDOM  Final   Special Requests NONE  Final   Culture >=100,000 COLONIES/mL PROTEUS MIRABILIS  Final   Report Status 10/22/2015 FINAL  Final   Organism ID, Bacteria PROTEUS MIRABILIS  Final      Susceptibility   Proteus mirabilis - MIC*    AMPICILLIN <=2 SENSITIVE Sensitive     GENTAMICIN <=1 SENSITIVE Sensitive     CEFTRIAXONE Value in next row Sensitive      SENSITIVE<=1    CIPROFLOXACIN Value in next row Sensitive      SENSITIVE<=0.25    IMIPENEM Value in next row Sensitive      SENSITIVE2    NITROFURANTOIN Value in next row Resistant      RESISTANT128    TRIMETH/SULFA Value in next row Sensitive      SENSITIVE<=20    PIP/TAZO Value in next row  Sensitive      SENSITIVE<=4    AMPICILLIN/SULBACTAM Value in next row Sensitive      SENSITIVE<=2    * >=100,000 COLONIES/mL PROTEUS MIRABILIS  Culture, blood (routine x 2)     Status: None   Collection Time: 10/20/15  9:57 AM  Result Value Ref Range Status   Specimen Description BLOOD RIGHT WRIST  Final   Special Requests   Final    BOTTLES DRAWN AEROBIC AND ANAEROBIC ANA 1ML AER 4ML   Culture  Setup Time   Final    GRAM NEGATIVE RODS IN BOTH AEROBIC AND ANAEROBIC BOTTLES CRITICAL RESULT CALLED TO, READ BACK BY AND VERIFIED WITH: MATT MCBANE ON 10/21/15 AT 0005 [facility] CONFIRMED BY [place]    Culture   Final    PROTEUS MIRABILIS IN BOTH AEROBIC AND ANAEROBIC BOTTLES    Report Status 10/22/2015 FINAL  Final   Organism ID, Bacteria PROTEUS MIRABILIS  Final      Susceptibility   Proteus mirabilis - MIC*    AMPICILLIN/SULBACTAM Value in next row Sensitive      SENSITIVE<=2    PIP/TAZO Value in next row Sensitive      SENSITIVE<=4    CEFTRIAXONE Value in next row Sensitive      SENSITIVE<=1    IMIPENEM Value in next row Sensitive      SENSITIVE2    GENTAMICIN Value in next row Sensitive      SENSITIVE<=1    CIPROFLOXACIN Value in next row Sensitive      SENSITIVE<=0.25    * PROTEUS MIRABILIS  Culture, blood (routine x 2)     Status: None   Collection Time: 10/20/15  9:57 AM  Result Value Ref Range Status   Specimen Description BLOOD LEFT HAND  Final   Special Requests   Final    BOTTLES DRAWN AEROBIC AND ANAEROBIC AER 3ML ANA 2ML   Culture  Setup Time   Final    GRAM NEGATIVE RODS IN BOTH AEROBIC AND ANAEROBIC BOTTLES CRITICAL VALUE NOTED.  VALUE IS CONSISTENT WITH PREVIOUSLY REPORTED AND CALLED VALUE.    Culture   Final    PROTEUS MIRABILIS IN BOTH AEROBIC AND ANAEROBIC BOTTLES    Report Status 10/22/2015 FINAL  Final   Organism ID, Bacteria PROTEUS MIRABILIS  Final      Susceptibility   Proteus  mirabilis - MIC*    AMPICILLIN Value in next row Sensitive      SENSITIVE<=2     PIP/TAZO Value in next row Sensitive      SENSITIVE<=4    CEFTAZIDIME Value in next row Sensitive      SENSITIVE<=1    CEFTRIAXONE Value in next row Sensitive      SENSITIVE<=1    CEFEPIME Value in next row Sensitive      SENSITIVE<=1    IMIPENEM Value in next row Sensitive      SENSITIVE2    GENTAMICIN Value in next row Sensitive      SENSITIVE<=1    CIPROFLOXACIN Value in next row Sensitive      SENSITIVE<=0.25    * PROTEUS MIRABILIS  Blood Culture ID Panel (Reflexed)     Status: Abnormal   Collection Time: 10/20/15  9:57 AM  Result Value Ref Range Status   Enterococcus species NOT DETECTED NOT DETECTED Final   Listeria monocytogenes NOT DETECTED NOT DETECTED Final   Staphylococcus species NOT DETECTED NOT DETECTED Final   Staphylococcus aureus NOT DETECTED NOT DETECTED Final   Streptococcus species NOT DETECTED NOT DETECTED Final   Streptococcus agalactiae NOT DETECTED NOT DETECTED Final   Streptococcus pneumoniae NOT DETECTED NOT DETECTED Final   Streptococcus pyogenes NOT DETECTED NOT DETECTED Final   Acinetobacter baumannii NOT DETECTED NOT DETECTED Final   Enterobacteriaceae species DETECTED (A) NOT DETECTED Final    Comment: CRITICAL RESULT CALLED TO, READ BACK BY AND VERIFIED WITH: MATT MCBANE ON 10/21/15 AT 0005 [place]    Enterobacter cloacae complex NOT DETECTED NOT DETECTED Final   Escherichia coli NOT DETECTED NOT DETECTED Final   Klebsiella oxytoca NOT DETECTED NOT DETECTED Final   Klebsiella pneumoniae NOT DETECTED NOT DETECTED Final   Proteus species (A) NOT DETECTED Final    CRITICAL RESULT CALLED TO, READ BACK BY AND VERIFIED WITH:    Comment: MATT MCBANE ON 10/21/15 AT 0005 [place]   Serratia marcescens NOT DETECTED NOT DETECTED Final   Haemophilus influenzae NOT DETECTED NOT DETECTED Final   Neisseria meningitidis NOT DETECTED NOT DETECTED Final   Pseudomonas aeruginosa NOT DETECTED NOT DETECTED Final   Candida albicans NOT DETECTED NOT DETECTED Final    Candida glabrata NOT DETECTED NOT DETECTED Final   Candida krusei NOT DETECTED NOT DETECTED Final   Candida parapsilosis NOT DETECTED NOT DETECTED Final   Candida tropicalis NOT DETECTED NOT DETECTED Final   Carbapenem resistance NOT DETECTED NOT DETECTED Final   Methicillin resistance NOT DETECTED NOT DETECTED Final   Vancomycin resistance NOT DETECTED NOT DETECTED Final  MRSA PCR Screening     Status: None   Collection Time: 10/20/15  7:41 PM  Result Value Ref Range Status   MRSA by PCR NEGATIVE NEGATIVE Final    Comment:        The GeneXpert MRSA Assay (FDA approved for NASAL specimens only), is one component of a comprehensive MRSA colonization surveillance program. It is not intended to diagnose MRSA infection nor to guide or monitor treatment for MRSA infections.   CULTURE, BLOOD (ROUTINE X 2) w Reflex to PCR ID Panel     Status: None   Collection Time: 10/22/15 11:39 AM  Result Value Ref Range Status   Specimen Description BLOOD LEFT HAND  Final   Special Requests BOTTLES DRAWN AEROBIC AND ANAEROBIC 1CC  Final   Culture NO GROWTH 5 DAYS  Final   Report Status 10/27/2015 FINAL  Final  Culture, expectorated sputum-assessment  Status: None   Collection Time: 10/22/15 12:00 PM  Result Value Ref Range Status   Specimen Description SPUTUM  Final   Special Requests Normal  Final   Sputum evaluation THIS SPECIMEN IS ACCEPTABLE FOR SPUTUM CULTURE  Final   Report Status 10/22/2015 FINAL  Final  Culture, respiratory (NON-Expectorated)     Status: None   Collection Time: 10/22/15 12:00 PM  Result Value Ref Range Status   Specimen Description SPUTUM  Final   Special Requests Normal Reflexed from [id number]  Final   Gram Stain   Final    FAIR SPECIMEN - 70-80% WBCS FEW WBC SEEN RARE YEAST RARE GRAM NEGATIVE RODS    Culture LIGHT GROWTH ENTEROBACTER AEROGENES  Final   Report Status 10/24/2015 FINAL  Final   Organism ID, Bacteria ENTEROBACTER AEROGENES  Final       Susceptibility   Enterobacter aerogenes - MIC*    CEFAZOLIN >=64 RESISTANT Resistant     CEFEPIME <=1 SENSITIVE Sensitive     CEFTAZIDIME <=1 SENSITIVE Sensitive     CEFTRIAXONE <=1 SENSITIVE Sensitive     CIPROFLOXACIN <=0.25 SENSITIVE Sensitive     GENTAMICIN <=1 SENSITIVE Sensitive     IMIPENEM 2 SENSITIVE Sensitive     TRIMETH/SULFA <=20 SENSITIVE Sensitive     PIP/TAZO <=4 SENSITIVE Sensitive     * LIGHT GROWTH ENTEROBACTER AEROGENES  Urine culture     Status: None   Collection Time: 10/22/15 12:45 PM  Result Value Ref Range Status   Specimen Description URINE, RANDOM  Final   Special Requests NONE  Final   Culture NO GROWTH 2 DAYS  Final   Report Status 10/24/2015 FINAL  Final  Urine culture     Status: None   Collection Time: 10/28/15  2:36 PM  Result Value Ref Range Status   Specimen Description URINE, RANDOM  Final   Special Requests NONE  Final   Culture NO GROWTH 2 DAYS  Final   Report Status 10/30/2015 FINAL  Final    Coagulation Studies: No results for input(s): LABPROT, INR in the last 72 hours.  Urinalysis:  Recent Labs  10/28/15 1437  COLORURINE YELLOW*  LABSPEC 1.014  PHURINE 5.0  GLUCOSEU 50*  HGBUR 1+*  BILIRUBINUR NEGATIVE  KETONESUR NEGATIVE  PROTEINUR 30*  NITRITE NEGATIVE  LEUKOCYTESUR TRACE*      Imaging: Dg Chest 1 View  10/30/2015  CLINICAL DATA:  Dyspnea, sepsis, acute renal failure, acute MI. EXAM: CHEST 1 VIEW COMPARISON:  Portable chest x-ray of October 29, 2015 FINDINGS: There has been interval extubation of the trachea and of the esophagus. The lungs are borderline hypoinflated. The pulmonary interstitial markings remain increased bilaterally. The cardiac silhouette remains enlarged. The pulmonary vascularity remains engorged but is slightly more distinct. The hemidiaphragms are partially obscured likely due to small amounts of pleural fluid especially on the left. The right internal jugular catheter tip projects over the distal third  of the SVC. The smaller caliber left internal jugular catheter projects over the midportion of the SVC. IMPRESSION: Interval extubation of the trachea and esophagus. Persistent pulmonary interstitial edema and likely small bilateral pleural effusions. Slight interval improvement in pulmonary vascular congestion. Electronically Signed   By: David  Martinique M.D.   On: 10/30/2015 07:24   Dg Chest 1 View  10/29/2015  CLINICAL DATA:  Dyspnea EXAM: CHEST 1 VIEW COMPARISON:  10/28/2015 FINDINGS: Endotracheal tube in good position. Right jugular central venous catheter tip at the cavoatrial atrial junction. Left jugular central venous catheter  tip at the cavoatrial junction. NG tube in place with tip not visualized due to underpenetration Diffuse bilateral airspace disease is unchanged. Slightly improved lung volume. Bibasilar atelectasis and small effusions. IMPRESSION: Support lines remain in good position Diffuse bilateral airspace disease unchanged, most consistent with pulmonary edema. Slightly improved lung volume. Electronically Signed   By: Franchot Gallo M.D.   On: 10/29/2015 07:32     Medications:   . norepinephrine (LEVOPHED) Adult infusion Stopped (10/25/15 2259)  . pureflow 2,000 mL/hr at 10/22/15 0104  . vasopressin (PITRESSIN) infusion - *FOR SHOCK* Stopped (10/23/15 0950)   . amiodarone  400 mg Oral BID  . antiseptic oral rinse  7 mL Mouth Rinse 10 times per day  . ceFEPime (MAXIPIME) IV  2 g Intravenous Q12H  . chlorhexidine gluconate  15 mL Mouth Rinse BID  . insulin aspart  0-15 Units Subcutaneous 6 times per day  . insulin detemir  13 Units Subcutaneous QHS  . ipratropium-albuterol  3 mL Nebulization Q6H  . oxyCODONE  5 mg Oral Q6H  . pantoprazole sodium  40 mg Per Tube Q24H  . potassium chloride  40 mEq Oral Daily  . senna-docusate  1 tablet Oral BID   acetaminophen **OR** acetaminophen, fentaNYL (SUBLIMAZE) injection, iohexol, LORazepam, sodium chloride flush  Assessment/ Plan:   [name] is a 70 y.o. white female with DM (A1c 7.7%),  who was admitted to [facility] on 10/20/2015  1. Acute renal failure with metabolic acidosis:  - Unknown baseline creatinine. Admit Cr 3.01 Required CRRT from 1/24 to 1/27. Then intermittent hemodialysis 1/28.  - Acute renal failure from sepsis leading to ATN. Serologic testing negative 10/21/15 - Now nonoliguric with good urine output. >4700 - Electrolytes and Volume status are acceptable No acute indication for Dialysis at present  - May remove dialysis catheter  2. 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 Hypokalemia: post ATN diuresis.  - potassium being replaced. prn  4. 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   6. Hypernatremia - from post ATN diuresis - increase free water replacement  -  deficit > 5 L - will start iv D5W although have to be careful with tenuous resp status  7. Acute resp failure - Extubated February 2, currently on BiPAP   LOS: 10 Sury Wentworth 2/3/201710:52 AM

## 2015-10-31 ENCOUNTER — Inpatient Hospital Stay: Payer: Medicare HMO

## 2015-10-31 DIAGNOSIS — R410 Disorientation, unspecified: Secondary | ICD-10-CM

## 2015-10-31 LAB — BASIC METABOLIC PANEL
ANION GAP: 6 (ref 5–15)
ANION GAP: 4 — AB (ref 5–15)
Anion gap: 6 (ref 5–15)
BUN: 49 mg/dL — AB (ref 6–20)
BUN: 42 mg/dL — ABNORMAL HIGH (ref 6–20)
BUN: 40 mg/dL — ABNORMAL HIGH (ref 6–20)
CHLORIDE: 122 mmol/L — AB (ref 101–111)
CO2: 30 mmol/L (ref 22–32)
CO2: 29 mmol/L (ref 22–32)
CO2: 31 mmol/L (ref 22–32)
CREATININE: 1.25 mg/dL — AB (ref 0.44–1.00)
Calcium: 8 mg/dL — ABNORMAL LOW (ref 8.9–10.3)
Calcium: 7.8 mg/dL — ABNORMAL LOW (ref 8.9–10.3)
Calcium: 7.8 mg/dL — ABNORMAL LOW (ref 8.9–10.3)
Chloride: 125 mmol/L — ABNORMAL HIGH (ref 101–111)
Chloride: 120 mmol/L — ABNORMAL HIGH (ref 101–111)
Creatinine, Ser: 1.27 mg/dL — ABNORMAL HIGH (ref 0.44–1.00)
Creatinine, Ser: 1.22 mg/dL — ABNORMAL HIGH (ref 0.44–1.00)
GFR calc Af Amer: 50 mL/min — ABNORMAL LOW (ref >60)
GFR calc Af Amer: 49 mL/min — ABNORMAL LOW (ref >60)
GFR calc non Af Amer: 42 mL/min — ABNORMAL LOW (ref >60)
GFR calc non Af Amer: 44 mL/min — ABNORMAL LOW (ref >60)
GFR, EST AFRICAN AMERICAN: 51 mL/min — AB (ref >60)
GFR, EST NON AFRICAN AMERICAN: 43 mL/min — AB (ref >60)
GLUCOSE: 205 mg/dL — AB (ref 65–99)
GLUCOSE: 279 mg/dL — AB (ref 65–99)
Glucose, Bld: 247 mg/dL — ABNORMAL HIGH (ref 65–99)
POTASSIUM: 3.2 mmol/L — AB (ref 3.5–5.1)
POTASSIUM: 3.2 mmol/L — AB (ref 3.5–5.1)
POTASSIUM: 3.4 mmol/L — AB (ref 3.5–5.1)
SODIUM: 157 mmol/L — AB (ref 135–145)
Sodium: 161 mmol/L (ref 135–145)
Sodium: 155 mmol/L — ABNORMAL HIGH (ref 135–145)

## 2015-10-31 LAB — BRAIN NATRIURETIC PEPTIDE: B Natriuretic Peptide: 268 pg/mL — ABNORMAL HIGH (ref 0.0–100.0)

## 2015-10-31 LAB — CBC
HCT: 30.6 % — ABNORMAL LOW (ref 35.0–47.0)
Hemoglobin: 9.9 g/dL — ABNORMAL LOW (ref 12.0–16.0)
MCH: 30.5 pg (ref 26.0–34.0)
MCHC: 32.5 g/dL (ref 32.0–36.0)
MCV: 93.7 fL (ref 80.0–100.0)
PLATELETS: 211 10*3/uL (ref 150–440)
RBC: 3.26 MIL/uL — AB (ref 3.80–5.20)
RDW: 14.7 % — AB (ref 11.5–14.5)
WBC: 28.4 10*3/uL — ABNORMAL HIGH (ref 3.6–11.0)

## 2015-10-31 LAB — GLUCOSE, CAPILLARY
GLUCOSE-CAPILLARY: 198 mg/dL — AB (ref 65–99)
GLUCOSE-CAPILLARY: 235 mg/dL — AB (ref 65–99)
GLUCOSE-CAPILLARY: 237 mg/dL — AB (ref 65–99)
Glucose-Capillary: 197 mg/dL — ABNORMAL HIGH (ref 65–99)
Glucose-Capillary: 180 mg/dL — ABNORMAL HIGH (ref 65–99)
Glucose-Capillary: 233 mg/dL — ABNORMAL HIGH (ref 65–99)
Glucose-Capillary: 218 mg/dL — ABNORMAL HIGH (ref 65–99)

## 2015-10-31 LAB — AMMONIA: Ammonia: 18 umol/L (ref 9–35)

## 2015-10-31 LAB — POTASSIUM: POTASSIUM: 3.4 mmol/L — AB (ref 3.5–5.1)

## 2015-10-31 LAB — MAGNESIUM: Magnesium: 2 mg/dL (ref 1.7–2.4)

## 2015-10-31 MED ORDER — ALTEPLASE 2 MG IJ SOLR
2.0000 mg | Freq: Once | INTRAMUSCULAR | Status: AC
  Administered 2015-10-31: 2 mg
  Filled 2015-10-31: qty 2

## 2015-10-31 MED ORDER — POTASSIUM CHLORIDE 10 MEQ/100ML IV SOLN
10.0000 meq | INTRAVENOUS | Status: AC
  Administered 2015-10-31 (×2): 10 meq via INTRAVENOUS
  Filled 2015-10-31 (×2): qty 100

## 2015-10-31 MED ORDER — STERILE WATER FOR INJECTION IJ SOLN
INTRAMUSCULAR | Status: AC
  Administered 2015-10-31: 10 mL
  Filled 2015-10-31: qty 10

## 2015-10-31 MED ORDER — FREE WATER: 200.0000 mL | Status: DC

## 2015-10-31 MED ORDER — POTASSIUM CHLORIDE 10 MEQ/100ML IV SOLN
10.0000 meq | INTRAVENOUS | Status: AC
  Administered 2015-10-31 – 2015-11-01 (×3): 10 meq via INTRAVENOUS
  Filled 2015-10-31 (×3): qty 100

## 2015-10-31 MED ORDER — SODIUM CHLORIDE 0.9 % IV SOLN
INTRAVENOUS | Status: DC
  Administered 2015-10-31: 1.8 [IU]/h via INTRAVENOUS
  Administered 2015-11-01: 3.6 [IU]/h via INTRAVENOUS
  Administered 2015-11-01: 10:00:00 via INTRAVENOUS
  Filled 2015-10-31 (×2): qty 2.5

## 2015-10-31 MED ORDER — POTASSIUM CHLORIDE 10 MEQ/100ML IV SOLN
10.0000 meq | INTRAVENOUS | Status: AC
  Administered 2015-10-31 (×3): 10 meq via INTRAVENOUS
  Filled 2015-10-31 (×3): qty 100

## 2015-10-31 MED ORDER — POTASSIUM CHLORIDE 10 MEQ/50ML IV SOLN: 10.0000 meq | INTRAVENOUS | Status: DC

## 2015-10-31 MED ORDER — LABETALOL HCL 5 MG/ML IV SOLN
10.0000 mg | INTRAVENOUS | Status: DC | PRN
  Administered 2015-10-31 (×2): 10 mg via INTRAVENOUS
  Filled 2015-10-31 (×2): qty 4

## 2015-10-31 NOTE — Progress Notes (Signed)
[place] Progress Note Patient Name: April [name] DOB: 07/22/1946 MRN: [id number]   Date of Service  10/31/2015  HPI/Events of Note  Na+ = 161.   eICU Interventions  Will order: 1. Place NGT to LIS. 2. Free water 200 mL via tube Q 4 hours.      Intervention Category Major Interventions: Electrolyte abnormality - evaluation and management  Marysa Wessner [place] 10/31/2015, 4:26 PM

## 2015-10-31 NOTE — Progress Notes (Signed)
Paged [facility], spoke with Dr. Jannifer Franklin r/t pt.'s HTN.  Discussed pt.'s Dx, current condition, suggested PRN medication. Dr. Jannifer Franklin ordered labetalol. Will monitor pt. Closely.

## 2015-10-31 NOTE — Progress Notes (Signed)
Dr.Campbell and Dr.Mungal informed of work families concerns about pt's, uncle who is acting as POA, having dementia. Dr. Megan Salon given cousin's phone number and name. Dr.campbell confirmed she will contact family.  Will continue to assess.

## 2015-10-31 NOTE — Progress Notes (Addendum)
[facility] NOTE  Pharmacy Consult for Electrolyte Management    Allergies  Allergen Reactions  . Prednisone Anaphylaxis    Patient Measurements: Height: '5\' 7"'$  (170.2 cm) Weight: 270 lb 4.5 oz (122.6 kg) IBW/kg (Calculated) : 61.6   Vital Signs: Temp: 99.9 F (37.7 C) (02/04 0900) Temp Source: Core (Comment) (02/04 0800) BP: 155/69 mmHg (02/04 0900) Pulse Rate: 76 (02/04 0900) Intake/Output from previous day: 02/03 0701 - 02/04 0700 In: 1520.8 [I.V.:1420.8; IV Piggyback:100] Out: 4955 [Urine:4955] Intake/Output from this shift: Total I/O In: 350 [I.V.:250; IV Piggyback:100] Out: 450 [Urine:450] Vent settings for last 24 hours: Vent Mode:  [-]  FiO2 (%):  [35 %] 35 %  Labs:  Recent Labs  10/29/15 0421 10/29/15 1437 10/30/15 0431 10/31/15 0500 10/31/15 0649  WBC 31.0*  --  29.4* 28.4*  --   HGB 9.4*  --  10.0* 9.9*  --   HCT 29.0*  --  30.8* 30.6*  --   PLT 132*  --  181 211  --   CREATININE 1.40* 1.37* 1.33* 1.25*  --   MG  --   --  2.1  --  2.0  PHOS  --   --  3.3  --   --    Estimated Creatinine Clearance: 57.7 mL/min (by C-G formula based on Cr of 1.25).   Recent Labs  10/31/15 0328 10/31/15 0744 10/31/15 1131  GLUCAP 197* 180* 198*    Assessment: 70 y/o F with septic shock from Proteus UTI.   K remains low at 3.2 after replacement this morning and has decreased from 3.4 this afternoon.   Plan:  Will give KCl 10 mEq IV x 4 doses Will recheck K/Mg/Phos with AM labs tomorrow.  Pharmacy will continue to monitor and adjust per consult.    Theodis Shove, PharmD Clinical Pharmacist   10/31/2015

## 2015-10-31 NOTE — Progress Notes (Addendum)
[facility] Progess Note    ASSESSMENT/PLAN  70 year old female with no significant past medical history, found unresponsive at home, intubated and subsequently Severe septic shock due to UTI Proteus, required CRRT and intermittent HD, extubated 2/2, now on requiring intermittent BiPAP and continue nasal cannula, mentation and back to baseline yet.  PULMONARY OETT 7.34m Respiratory failure - hypoxic Septic shock -slowly improving Reviewed CT and chest x-ray images reports, appears to be his suggestive of bibasilar atelectasis with small bibasilar effusions, possibility of pneumonia, particularly in light of continued fevers.  P:  - check abg in the AM - currently doing well with [place] and intermittent bipap -Chest x-ray 10/29/15. Images reviewed, continues to show bibasilar atelectasis. -Continue with diuresis.  DVT prophylaxis with Lovenox.  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 Urine culture 10/22/2015 negative thus far. Blood culture 10/22/2015: Negative. Urine culture 10/20/2015: Positive for Proteus mirabilis..Marland KitchenMRSA screen negative  CARDIOVASCULAR CVL - RIJ VASCATH, [facility]J CVL Shock - septic, hypotension, improved, patient has been weaned off of pressors. NSTEMI Afib - started on amiodarone 1/24  P:  - maintain MAP >65 - vasopressors as needed - currently weaned off.  - monitor BP - trend CE - heparin gtt on hold due to low plt - now normalized - mostly likely demand ischemia - EKG reviewed, no sig ST changes.  - cont with amiodarone -  cardiology consult appreciated.   RENAL  Status post ARF-kidney function continues to improve, with decreasing creatinine and good urine output. UTI Hypernatremia Right hydronephrosis Proteus Bacteremia, Proteus UTI, CT abdomen reviewed-no evidence of abdominal abscess. P:  -monitor Cr, urine output and creatinine improving -workup of hydronephrosis can be done as an outpatient with urology -currently on D5W for elevated Na, has large free H2O deficit, increase free H2O  GASTROINTESTINAL - extubated on 2/2, mentation is still not back to baseline, still with confusion and mental slowness, may need to place NGT for tube feeds if no improvement over the next 24 hours Elevated LFTs - trend   HEMATOLOGIC Leukocytosis-slowly improve Thrombocytopenia -resolved P:  -HIT positive, no longer on heparin, may need to start agatroban for DVT prophylaxis   ENDOCRINE ICU hypo/hyperglycemia protocol -Blood glucose improved this morning, on with [facility] NEUROLOGIC A: Acute encephalopathy P:  RASS goal: -1 - metabolic encephalopathy, improved this morning.  - cont to monitor neuro status.  -neurology following, recommend MRI  Social -Patient followed by palliative care, currently patient is DO NOT RESUSCITATE. However, this order was obtained via a distant uncle, who may have dementia and may not be appropriate to make medical decisions for the patient. This has not been verified. I discussed this with palliative care physician who is currently handling CODE STATUS, palliative care physician stated they would look further into appropriate power of attorney.  ---------------------------------------   ----------------------------------------   Name: April LEPAKMRN: [id number]: 1February 13, 1947   ADMISSION DATE:  10/20/2015    CHIEF COMPLAINT:  dyspnea    SUBJECTIVE:   Patient currently extubated, is following simple commands, however there is mental slowness, associated with  altered mental status. Of note, RN  stated that via her coworkers the patient's power of attorney (distant uncle) may have dementia and may not be the appropriate person to make medical decisions for the patient.   VITAL SIGNS: Temp:  [99.1 F (37.3 C)-101.7 F (38.7 C)] 99.9 F (37.7 C) (02/04 0900) Pulse Rate:  [76-93] 76 (02/04 0900) Resp:  [27-38] 34 (02/04 0900) BP: (149-172)/(56-73) 155/69 mmHg (02/04 0900) SpO2:  [87 %-100 %] 87 % (02/04 0900) FiO2 (%):  [35 %] 35 % (02/03 2000) Weight:  [270 lb 4.5 oz (122.6 kg)] 270 lb 4.5 oz (122.6 kg) (02/04 0500) HEMODYNAMICS:   VENTILATOR SETTINGS: Vent Mode:  [-]  FiO2 (%):  [35 %] 35 % INTAKE / OUTPUT:  Intake/Output Summary (Last 24 hours) at 10/31/15 1353 Last data filed at 10/31/15 0904  Gross per 24 hour  Intake 1793.33 ml  Output   2255 ml  Net -461.67 ml    PHYSICAL EXAMINATION: Physical Examination:   VS: BP 155/69 mmHg  Pulse 76  Temp(Src) 99.9 F (37.7 C) (Core (Comment))  Resp 34  Ht '5\' 7"'$  (1.702 m)  Wt 270 lb 4.5 oz (122.6 kg)  BMI 42.32 kg/m2  SpO2 87%  General Appearance: No distress  Neuro:without focal findings,  HEENT: PERRLA, EOM intact. Pulmonary: normal breath sounds   CardiovascularNormal S1,S2.  No m/r/g.   Abdomen: Benign, Soft, non-tender. Renal:  No costovertebral tenderness  GU:  Not performed at this time. Endocrine: No evident thyromegaly. Skin:   warm, no rashes, no ecchymosis  Extremities: normal, no cyanosis, clubbing.   LABS:   LABORATORY PANEL:   CBC  Recent Labs Lab 10/31/15 0500  WBC 28.4*  HGB 9.9*  HCT 30.6*  PLT 211    Chemistries   Recent Labs Lab 10/26/15 0400  10/30/15 0431 10/31/15 0500 10/31/15 0649  NA 145  < > 157* 161*  --   K 3.2*  < > 3.7 3.2*  --   CL 110  < > 121* 125*  --   CO2 29  < > 31 30  --   GLUCOSE 232*  < > 171* 205*  --   BUN 89*  < > 54* 49*  --   CREATININE 2.11*  < > 1.33* 1.25*  --   CALCIUM 7.5*  < > 8.1* 8.0*  --   MG  2.3  < > 2.1  --  2.0  PHOS 4.5  --  3.3  --   --   AST 92*  --   --   --   --   ALT 174*  --   --   --   --   ALKPHOS 188*  --   --   --   --   BILITOT 2.1*  --   --   --   --   < > = values in this interval not displayed.   Recent Labs Lab 10/30/15 1803 10/30/15 2046 10/30/15 2335 10/31/15 0328 10/31/15 0744 10/31/15 1131  GLUCAP 166* 169* 174* 197* 180* 198*    Recent Labs Lab 10/29/15 0350 10/29/15 1232 10/30/15 0438  PHART 7.43 7.34* 7.52*  PCO2ART 48 58* 40  PO2ART 81* 70* 57*    Recent Labs Lab 10/26/15 0400  AST 92*  ALT 174*  ALKPHOS 188*  BILITOT 2.1*  ALBUMIN 1.7*    Cardiac Enzymes No results for input(s): TROPONINI in the last 168 hours.  RADIOLOGY:  Dg Chest 1 View  10/31/2015  CLINICAL DATA:  Dyspnea. EXAM: CHEST  1 VIEW COMPARISON:  10/30/2015 FINDINGS: Right jugular central venous catheter terminates over the lower SVC/ cavoatrial junction, unchanged. Left jugular catheter terminates over the mid SVC. Cardiac silhouette is partially obscured but grossly unchanged. Thoracic aortic calcification is noted. Lung volumes remain diminished with pulmonary vascular congestion the and mild interstitial edema. Bibasilar opacities are unchanged and likely reflect a combination of atelectasis and small pleural effusions. Old left rib fractures. IMPRESSION: Pulmonary edema, bibasilar atelectasis, and small bilateral pleural effusions. No significant interval change. Electronically Signed   By: Logan Bores M.D.   On: 10/31/2015 07:22   Dg Chest 1 View  10/30/2015  CLINICAL DATA:  Dyspnea, sepsis, acute renal failure, acute MI. EXAM: CHEST 1 VIEW COMPARISON:  Portable chest x-ray of October 29, 2015 FINDINGS: There has been interval extubation of the trachea and of the esophagus. The lungs are borderline hypoinflated. The pulmonary interstitial markings remain increased bilaterally. The cardiac silhouette remains enlarged. The pulmonary vascularity remains engorged but  is slightly more distinct. The hemidiaphragms are partially obscured likely due to small amounts of pleural fluid especially on the left. The right internal jugular catheter tip projects over the distal third of the SVC. The smaller caliber left internal jugular catheter projects over the midportion of the SVC. IMPRESSION: Interval extubation of the trachea and esophagus. Persistent pulmonary interstitial edema and likely small bilateral pleural effusions. Slight interval improvement in pulmonary vascular congestion. Electronically Signed   By: David  Martinique M.D.   On: 10/30/2015 07:24     I have personally obtained a history, examined the patient, evaluated laboratory and imaging results, formulated the assessment and plan and placed orders. CRITICAL CARE: The patient is critically ill with multiple organ systems failure and requires high complexity decision making for assessment and support, frequent evaluation and titration of therapies, application of advanced monitoring technologies and extensive interpretation of multiple databases. Critical Care Time devoted to patient care services described in this note is 40 minutes.    Vilinda Boehringer, MD [facility] Pager [phone] (please enter 7-digits) On Call Pager - [phone] (please enter 7-digits)

## 2015-10-31 NOTE — Progress Notes (Signed)
Nutrition Follow-up     INTERVENTION:  EN: Holding of on enteral nutrition at this time. No access currently.  Coordination of care: Spoke with RN, Ameila and planning SLP evaluation prior to po diet.  Pt more alert today   NUTRITION DIAGNOSIS:   Inadequate oral intake related to acute illness as evidenced by NPO status.    GOAL:   Provide needs based on ASPEN/SCCM guidelines    MONITOR:    (Energy Intake, Pulmonary, Digestive System, Electrolyte/Renal Profile, Glucose Profile)  REASON FOR ASSESSMENT:   Consult Enteral/tube feeding initiation and management  ASSESSMENT:      Pt extubated and little more alert today per RN, Ameila.     Diet Order:  Diet NPO time specified  Skin:  Reviewed, no issues  Last BM:  2/3  Height:   Ht Readings from Last 1 Encounters:  10/20/15 '5\' 7"'$  (1.702 m)    Weight:   Wt Readings from Last 1 Encounters:  10/31/15 270 lb 4.5 oz (122.6 kg)    Ideal Body Weight:     BMI:  Body mass index is 42.32 kg/(m^2).  Estimated Nutritional Needs:   Kcal:  BEE 1167 kcals (IF 1.1-1.2, AF 1.2) 1540-1680 kcals/d. (Using IBW of 61kg)  Protein:  (1.0-1.2 g/kg) 61-73 g/d  Fluid:  1525-1830 (25-34m/kg)   EDUCATION NEEDS:   No education needs identified at this time  HIGH Care Level  Taylynn Easton B. AZenia Resides [place]Moore[place] LHazel(pager) Weekend/On-Call pager [phone]

## 2015-10-31 NOTE — Progress Notes (Signed)
E-link physician returned call and stated "They can address this in the morning, radiology won't be there tonight."  Will continue to assess.

## 2015-10-31 NOTE — Progress Notes (Signed)
[facility] NOTE  Pharmacy Consult for Cefepime Dosing, Electrolyte Management, Constipation Prevention Indication: HD/ Proteus Bacteremia     Allergies  Allergen Reactions  . Prednisone Anaphylaxis    Patient Measurements: Height: '5\' 7"'$  (170.2 cm) Weight: 270 lb 4.5 oz (122.6 kg) IBW/kg (Calculated) : 61.6   Vital Signs: Temp: 100 F (37.8 C) (02/04 0600) Temp Source: Core (Comment) (02/04 0400) BP: 162/65 mmHg (02/04 0600) Pulse Rate: 85 (02/04 0600) Intake/Output from previous day: 02/03 0701 - 02/04 0700 In: 1402.5 [I.V.:1302.5; IV Piggyback:100] Out: 2440 [Urine:4955] Intake/Output from this shift:   Vent settings for last 24 hours: Vent Mode:  [-]  FiO2 (%):  [35 %] 35 %  Labs:  Recent Labs  10/29/15 0421 10/29/15 1437 10/30/15 0431 10/31/15 0500  WBC 31.0*  --  29.4* 28.4*  HGB 9.4*  --  10.0* 9.9*  HCT 29.0*  --  30.8* 30.6*  PLT 132*  --  181 211  CREATININE 1.40* 1.37* 1.33* 1.25*  MG  --   --  2.1  --   PHOS  --   --  3.3  --    Estimated Creatinine Clearance: 57.7 mL/min (by C-G formula based on Cr of 1.25).   Recent Labs  10/30/15 2335 10/31/15 0328 10/31/15 0744  GLUCAP 174* 197* 180*    Microbiology: Recent Results (from the past 720 hour(s))  Urine culture     Status: None   Collection Time: 10/20/15  9:57 AM  Result Value Ref Range Status   Specimen Description URINE, RANDOM  Final   Special Requests NONE  Final   Culture >=100,000 COLONIES/mL PROTEUS MIRABILIS  Final   Report Status 10/22/2015 FINAL  Final   Organism ID, Bacteria PROTEUS MIRABILIS  Final      Susceptibility   Proteus mirabilis - MIC*    AMPICILLIN <=2 SENSITIVE Sensitive     GENTAMICIN <=1 SENSITIVE Sensitive     CEFTRIAXONE Value in next row Sensitive      SENSITIVE<=1    CIPROFLOXACIN Value in next row Sensitive      SENSITIVE<=0.25    IMIPENEM Value in next row Sensitive      SENSITIVE2    NITROFURANTOIN Value in next row  Resistant      RESISTANT128    TRIMETH/SULFA Value in next row Sensitive      SENSITIVE<=20    PIP/TAZO Value in next row Sensitive      SENSITIVE<=4    AMPICILLIN/SULBACTAM Value in next row Sensitive      SENSITIVE<=2    * >=100,000 COLONIES/mL PROTEUS MIRABILIS  Culture, blood (routine x 2)     Status: None   Collection Time: 10/20/15  9:57 AM  Result Value Ref Range Status   Specimen Description BLOOD RIGHT WRIST  Final   Special Requests   Final    BOTTLES DRAWN AEROBIC AND ANAEROBIC ANA 1ML AER 4ML   Culture  Setup Time   Final    GRAM NEGATIVE RODS IN BOTH AEROBIC AND ANAEROBIC BOTTLES CRITICAL RESULT CALLED TO, READ BACK BY AND VERIFIED WITH: MATT MCBANE ON 10/21/15 AT 0005 [facility] CONFIRMED BY Evan    Culture   Final    PROTEUS MIRABILIS IN BOTH AEROBIC AND ANAEROBIC BOTTLES    Report Status 10/22/2015 FINAL  Final   Organism ID, Bacteria PROTEUS MIRABILIS  Final      Susceptibility   Proteus mirabilis - MIC*    AMPICILLIN/SULBACTAM Value in next row Sensitive  SENSITIVE<=2    PIP/TAZO Value in next row Sensitive      SENSITIVE<=4    CEFTRIAXONE Value in next row Sensitive      SENSITIVE<=1    IMIPENEM Value in next row Sensitive      SENSITIVE2    GENTAMICIN Value in next row Sensitive      SENSITIVE<=1    CIPROFLOXACIN Value in next row Sensitive      SENSITIVE<=0.25    * PROTEUS MIRABILIS  Culture, blood (routine x 2)     Status: None   Collection Time: 10/20/15  9:57 AM  Result Value Ref Range Status   Specimen Description BLOOD LEFT HAND  Final   Special Requests   Final    BOTTLES DRAWN AEROBIC AND ANAEROBIC AER 3ML ANA 2ML   Culture  Setup Time   Final    GRAM NEGATIVE RODS IN BOTH AEROBIC AND ANAEROBIC BOTTLES CRITICAL VALUE NOTED.  VALUE IS CONSISTENT WITH PREVIOUSLY REPORTED AND CALLED VALUE.    Culture   Final    PROTEUS MIRABILIS IN BOTH AEROBIC AND ANAEROBIC BOTTLES    Report Status 10/22/2015 FINAL  Final   Organism ID, Bacteria PROTEUS  MIRABILIS  Final      Susceptibility   Proteus mirabilis - MIC*    AMPICILLIN Value in next row Sensitive      SENSITIVE<=2    PIP/TAZO Value in next row Sensitive      SENSITIVE<=4    CEFTAZIDIME Value in next row Sensitive      SENSITIVE<=1    CEFTRIAXONE Value in next row Sensitive      SENSITIVE<=1    CEFEPIME Value in next row Sensitive      SENSITIVE<=1    IMIPENEM Value in next row Sensitive      SENSITIVE2    GENTAMICIN Value in next row Sensitive      SENSITIVE<=1    CIPROFLOXACIN Value in next row Sensitive      SENSITIVE<=0.25    * PROTEUS MIRABILIS  Blood Culture ID Panel (Reflexed)     Status: Abnormal   Collection Time: 10/20/15  9:57 AM  Result Value Ref Range Status   Enterococcus species NOT DETECTED NOT DETECTED Final   Listeria monocytogenes NOT DETECTED NOT DETECTED Final   Staphylococcus species NOT DETECTED NOT DETECTED Final   Staphylococcus aureus NOT DETECTED NOT DETECTED Final   Streptococcus species NOT DETECTED NOT DETECTED Final   Streptococcus agalactiae NOT DETECTED NOT DETECTED Final   Streptococcus pneumoniae NOT DETECTED NOT DETECTED Final   Streptococcus pyogenes NOT DETECTED NOT DETECTED Final   Acinetobacter baumannii NOT DETECTED NOT DETECTED Final   Enterobacteriaceae species DETECTED (A) NOT DETECTED Final    Comment: CRITICAL RESULT CALLED TO, READ BACK BY AND VERIFIED WITH: MATT MCBANE ON 10/21/15 AT 0005 [place]    Enterobacter cloacae complex NOT DETECTED NOT DETECTED Final   Escherichia coli NOT DETECTED NOT DETECTED Final   Klebsiella oxytoca NOT DETECTED NOT DETECTED Final   Klebsiella pneumoniae NOT DETECTED NOT DETECTED Final   Proteus species (A) NOT DETECTED Final    CRITICAL RESULT CALLED TO, READ BACK BY AND VERIFIED WITH:    Comment: MATT MCBANE ON 10/21/15 AT 0005 [place]   Serratia marcescens NOT DETECTED NOT DETECTED Final   Haemophilus influenzae NOT DETECTED NOT DETECTED Final   Neisseria meningitidis NOT DETECTED NOT  DETECTED Final   Pseudomonas aeruginosa NOT DETECTED NOT DETECTED Final   Candida albicans NOT DETECTED NOT DETECTED Final  Candida glabrata NOT DETECTED NOT DETECTED Final   Candida krusei NOT DETECTED NOT DETECTED Final   Candida parapsilosis NOT DETECTED NOT DETECTED Final   Candida tropicalis NOT DETECTED NOT DETECTED Final   Carbapenem resistance NOT DETECTED NOT DETECTED Final   Methicillin resistance NOT DETECTED NOT DETECTED Final   Vancomycin resistance NOT DETECTED NOT DETECTED Final  MRSA PCR Screening     Status: None   Collection Time: 10/20/15  7:41 PM  Result Value Ref Range Status   MRSA by PCR NEGATIVE NEGATIVE Final    Comment:        The GeneXpert MRSA Assay (FDA approved for NASAL specimens only), is one component of a comprehensive MRSA colonization surveillance program. It is not intended to diagnose MRSA infection nor to guide or monitor treatment for MRSA infections.   CULTURE, BLOOD (ROUTINE X 2) w Reflex to PCR ID Panel     Status: None   Collection Time: 10/22/15 11:39 AM  Result Value Ref Range Status   Specimen Description BLOOD LEFT HAND  Final   Special Requests BOTTLES DRAWN AEROBIC AND ANAEROBIC 1CC  Final   Culture NO GROWTH 5 DAYS  Final   Report Status 10/27/2015 FINAL  Final  Culture, expectorated sputum-assessment     Status: None   Collection Time: 10/22/15 12:00 PM  Result Value Ref Range Status   Specimen Description SPUTUM  Final   Special Requests Normal  Final   Sputum evaluation THIS SPECIMEN IS ACCEPTABLE FOR SPUTUM CULTURE  Final   Report Status 10/22/2015 FINAL  Final  Culture, respiratory (NON-Expectorated)     Status: None   Collection Time: 10/22/15 12:00 PM  Result Value Ref Range Status   Specimen Description SPUTUM  Final   Special Requests Normal Reflexed from [id number]  Final   Gram Stain   Final    FAIR SPECIMEN - 70-80% WBCS FEW WBC SEEN RARE YEAST RARE GRAM NEGATIVE RODS    Culture LIGHT GROWTH ENTEROBACTER  AEROGENES  Final   Report Status 10/24/2015 FINAL  Final   Organism ID, Bacteria ENTEROBACTER AEROGENES  Final      Susceptibility   Enterobacter aerogenes - MIC*    CEFAZOLIN >=64 RESISTANT Resistant     CEFEPIME <=1 SENSITIVE Sensitive     CEFTAZIDIME <=1 SENSITIVE Sensitive     CEFTRIAXONE <=1 SENSITIVE Sensitive     CIPROFLOXACIN <=0.25 SENSITIVE Sensitive     GENTAMICIN <=1 SENSITIVE Sensitive     IMIPENEM 2 SENSITIVE Sensitive     TRIMETH/SULFA <=20 SENSITIVE Sensitive     PIP/TAZO <=4 SENSITIVE Sensitive     * LIGHT GROWTH ENTEROBACTER AEROGENES  Urine culture     Status: None   Collection Time: 10/22/15 12:45 PM  Result Value Ref Range Status   Specimen Description URINE, RANDOM  Final   Special Requests NONE  Final   Culture NO GROWTH 2 DAYS  Final   Report Status 10/24/2015 FINAL  Final  Urine culture     Status: None   Collection Time: 10/28/15  2:36 PM  Result Value Ref Range Status   Specimen Description URINE, RANDOM  Final   Special Requests NONE  Final   Culture NO GROWTH 2 DAYS  Final   Report Status 10/30/2015 FINAL  Final    Medications:  Scheduled:  . ceFEPime (MAXIPIME) IV  2 g Intravenous Q12H  . chlorhexidine gluconate  15 mL Mouth Rinse BID  . insulin aspart  0-15 Units Subcutaneous 6 times per day  .  ipratropium-albuterol  3 mL Nebulization Q6H  . pantoprazole (PROTONIX) IV  40 mg Intravenous Q24H   Infusions:  . dextrose 125 mL/hr at 10/31/15 0608  . norepinephrine (LEVOPHED) Adult infusion Stopped (10/25/15 2259)  . vasopressin (PITRESSIN) infusion - *FOR SHOCK* Stopped (10/23/15 1694)    Assessment: 70 y/o F with septic shock from Proteus UTI. Patient with ARF recently transitioned from CRRT to HD not currently requiring HD.    Plan:  1. Will continue cefepime dosing  2 g iv q 12 hours for renal function.   2. Electrolytes: K= 3.2. Magnesium level pending. Will replace K with KCl 22mEq x 3 runs. Will order K to be drawn @ 13:00.  3.  Constipation: Patient with last BM 2/3. Will continue senna/docusate to 2 tab BID.    Pharmacy will continue to monitor and adjust per consult.    SUlice Dash PharmD Clinical Pharmacist   10/31/2015

## 2015-10-31 NOTE — Progress Notes (Signed)
Patient ID: April [name], female   DOB: 11/30/1945, 70 y.o.   MRN: [id number] [facility] PROGRESS NOTE  HANNAN TETZLAFF LZJ:[id number] DOB: 1946/06/17 DOA: 10/20/2015 PCP: Albina Billet, MD  HPI/Subjective: Patient answers some yes or no questions. Offers no complaints. Can't elaborate much.  Objective: Filed Vitals:   10/31/15 0800 10/31/15 0900  BP: 149/73 155/69  Pulse: 78 76  Temp: 100 F (37.8 C) 99.9 F (37.7 C)  Resp: 32 34    Filed Weights   10/29/15 0536 10/30/15 0500 10/31/15 0500  Weight: 126.3 kg (278 lb 7.1 oz) 129.5 kg (285 lb 7.9 oz) 122.6 kg (270 lb 4.5 oz)    ROS: Review of Systems  Constitutional: Negative for fever and chills.  Eyes: Negative for blurred vision.  Respiratory: Negative for cough and shortness of breath.   Cardiovascular: Negative for chest pain.  Gastrointestinal: Negative for nausea, vomiting, abdominal pain, diarrhea and constipation.  Genitourinary: Negative for dysuria.  Musculoskeletal: Negative for joint pain.  Neurological: Negative for dizziness and headaches.   Exam: Physical Exam  HENT:  Nose: No mucosal edema.  Mouth/Throat: No oropharyngeal exudate or posterior oropharyngeal edema.  Eyes: Conjunctivae, EOM and lids are normal. Pupils are equal, round, and reactive to light.  Neck: No JVD present. Carotid bruit is not present. No edema present. No thyroid mass and no thyromegaly present.  Cardiovascular: S1 normal and S2 normal.  Exam reveals no gallop.   No murmur heard. Pulses:      Dorsalis pedis pulses are 2+ on the right side, and 2+ on the left side.  Respiratory: No respiratory distress. She has decreased breath sounds in the right lower field and the left lower field. She has no wheezes. She has no rhonchi. She has rales in the right lower field and the left lower field.  GI: Soft. Bowel sounds are normal. There is no tenderness.  Musculoskeletal:       Right ankle: She exhibits swelling.       Left  ankle: She exhibits swelling.  Lymphadenopathy:    She has no cervical adenopathy.  Neurological: She is alert.  Just barely able to lift arms up off the bed.  Skin: Skin is warm. Nails show no clubbing.  Right leg demarcated halfway up the lower leg. Gangrene all the toes and right foot. Left foot demarcated and left toes gangrenous.  Psychiatric: She has a normal mood and affect.      Data Reviewed: Basic Metabolic Panel:  Recent Labs Lab 10/26/15 0400  10/28/15 0443 10/29/15 0421 10/29/15 1437 10/30/15 0431 10/31/15 0500 10/31/15 0649  NA 145  < > 152* 154* 155* 157* 161*  --   K 3.2*  < > 3.9 4.1 4.8 3.7 3.2*  --   CL 110  < > 119* 123* 121* 121* 125*  --   CO2 29  < > '29 30 31 31 30  '$ --   GLUCOSE 232*  < > 209* 211* 212* 171* 205*  --   BUN 89*  < > 71* 61* 59* 54* 49*  --   CREATININE 2.11*  < > 1.60* 1.40* 1.37* 1.33* 1.25*  --   CALCIUM 7.5*  < > 7.7* 7.8* 8.0* 8.1* 8.0*  --   MG 2.3  --  2.3  --   --  2.1  --  2.0  PHOS 4.5  --   --   --   --  3.3  --   --   < > =  values in this interval not displayed. Liver Function Tests:  Recent Labs Lab 10/26/15 0400  AST 92*  ALT 174*  ALKPHOS 188*  BILITOT 2.1*  PROT 5.3*  ALBUMIN 1.7*    Recent Labs Lab 10/31/15 0649  AMMONIA 18   CBC:  Recent Labs Lab 10/27/15 0433 10/28/15 0443 10/29/15 0421 10/30/15 0431 10/31/15 0500  WBC 27.6* 26.2* 31.0* 29.4* 28.4*  HGB 10.0* 9.7* 9.4* 10.0* 9.9*  HCT 30.4* 29.9* 29.0* 30.8* 30.6*  MCV 91.0 93.0 92.1 94.1 93.7  PLT 82* 109* 132* 181 211   BNP (last 3 results)  Recent Labs  10/31/15 0649  BNP 268.0*     CBG:  Recent Labs Lab 10/30/15 2046 10/30/15 2335 10/31/15 0328 10/31/15 0744 10/31/15 1131  GLUCAP 169* 174* 197* 180* 198*    Recent Results (from the past 240 hour(s))  CULTURE, BLOOD (ROUTINE X 2) w Reflex to PCR ID Panel     Status: None   Collection Time: 10/22/15 11:39 AM  Result Value Ref Range Status   Specimen Description BLOOD  LEFT HAND  Final   Special Requests BOTTLES DRAWN AEROBIC AND ANAEROBIC 1CC  Final   Culture NO GROWTH 5 DAYS  Final   Report Status 10/27/2015 FINAL  Final  Culture, expectorated sputum-assessment     Status: None   Collection Time: 10/22/15 12:00 PM  Result Value Ref Range Status   Specimen Description SPUTUM  Final   Special Requests Normal  Final   Sputum evaluation THIS SPECIMEN IS ACCEPTABLE FOR SPUTUM CULTURE  Final   Report Status 10/22/2015 FINAL  Final  Culture, respiratory (NON-Expectorated)     Status: None   Collection Time: 10/22/15 12:00 PM  Result Value Ref Range Status   Specimen Description SPUTUM  Final   Special Requests Normal Reflexed from [id number]  Final   Gram Stain   Final    FAIR SPECIMEN - 70-80% WBCS FEW WBC SEEN RARE YEAST RARE GRAM NEGATIVE RODS    Culture LIGHT GROWTH ENTEROBACTER AEROGENES  Final   Report Status 10/24/2015 FINAL  Final   Organism ID, Bacteria ENTEROBACTER AEROGENES  Final      Susceptibility   Enterobacter aerogenes - MIC*    CEFAZOLIN >=64 RESISTANT Resistant     CEFEPIME <=1 SENSITIVE Sensitive     CEFTAZIDIME <=1 SENSITIVE Sensitive     CEFTRIAXONE <=1 SENSITIVE Sensitive     CIPROFLOXACIN <=0.25 SENSITIVE Sensitive     GENTAMICIN <=1 SENSITIVE Sensitive     IMIPENEM 2 SENSITIVE Sensitive     TRIMETH/SULFA <=20 SENSITIVE Sensitive     PIP/TAZO <=4 SENSITIVE Sensitive     * LIGHT GROWTH ENTEROBACTER AEROGENES  Urine culture     Status: None   Collection Time: 10/22/15 12:45 PM  Result Value Ref Range Status   Specimen Description URINE, RANDOM  Final   Special Requests NONE  Final   Culture NO GROWTH 2 DAYS  Final   Report Status 10/24/2015 FINAL  Final  Urine culture     Status: None   Collection Time: 10/28/15  2:36 PM  Result Value Ref Range Status   Specimen Description URINE, RANDOM  Final   Special Requests NONE  Final   Culture NO GROWTH 2 DAYS  Final   Report Status 10/30/2015 FINAL  Final     Studies: Dg  Chest 1 View  10/31/2015  CLINICAL DATA:  Dyspnea. EXAM: CHEST 1 VIEW COMPARISON:  10/30/2015 FINDINGS: Right jugular central venous catheter terminates over the  lower SVC/ cavoatrial junction, unchanged. Left jugular catheter terminates over the mid SVC. Cardiac silhouette is partially obscured but grossly unchanged. Thoracic aortic calcification is noted. Lung volumes remain diminished with pulmonary vascular congestion the and mild interstitial edema. Bibasilar opacities are unchanged and likely reflect a combination of atelectasis and small pleural effusions. Old left rib fractures. IMPRESSION: Pulmonary edema, bibasilar atelectasis, and small bilateral pleural effusions. No significant interval change. Electronically Signed   By: Logan Bores M.D.   On: 10/31/2015 07:22   Dg Chest 1 View  10/30/2015  CLINICAL DATA:  Dyspnea, sepsis, acute renal failure, acute MI. EXAM: CHEST 1 VIEW COMPARISON:  Portable chest x-ray of October 29, 2015 FINDINGS: There has been interval extubation of the trachea and of the esophagus. The lungs are borderline hypoinflated. The pulmonary interstitial markings remain increased bilaterally. The cardiac silhouette remains enlarged. The pulmonary vascularity remains engorged but is slightly more distinct. The hemidiaphragms are partially obscured likely due to small amounts of pleural fluid especially on the left. The right internal jugular catheter tip projects over the distal third of the SVC. The smaller caliber left internal jugular catheter projects over the midportion of the SVC. IMPRESSION: Interval extubation of the trachea and esophagus. Persistent pulmonary interstitial edema and likely small bilateral pleural effusions. Slight interval improvement in pulmonary vascular congestion. Electronically Signed   By: David  Martinique M.D.   On: 10/30/2015 07:24    Scheduled Meds: . ceFEPime (MAXIPIME) IV  2 g Intravenous Q12H  . chlorhexidine gluconate  15 mL Mouth Rinse BID  .  insulin aspart  0-15 Units Subcutaneous 6 times per day  . ipratropium-albuterol  3 mL Nebulization Q6H  . pantoprazole (PROTONIX) IV  40 mg Intravenous Q24H   Continuous Infusions: . dextrose 125 mL/hr at 10/31/15 1216  . norepinephrine (LEVOPHED) Adult infusion Stopped (10/25/15 2259)  . vasopressin (PITRESSIN) infusion - *FOR SHOCK* Stopped (10/23/15 0950)    Assessment/Plan:  1. Septic shock due to Proteus bacteremia. Sputum culture also positive for Enterobacter pneumonia. Patient is on cefepime. Patient still spiking fevers and has gangrene on toes at this point. Off pressors at this point. 2. Acute renal failure. This has improved likely due to ATN in the setting of sepsis and rhabdomyolysis. Patient was initially on CRRT and then HD. Creatinine has improved enough. 3. Atrial fibrillation with rapid ventricular response. Heart rate controlled 4. Type 2 diabetes- patient on sliding scale insulin 5. Elevated liver function tests and jaundice- likely secondary shock liver 6. Acute rhabdomyolysis- CPKs have trended better 7. 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 Last platelet count 211. 8. Acute respiratory failure- patient now extubated. Continue nasal cannula and intermittent BiPAP 9. Severe hypernatremia- on D5W. Sodium still very high today at 161. 10. Acute encephalopathy. Patient now answering questions. Will have swallow evaluation to see if she can swallow. 11. 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 If she survives she will likely need bilateral amputations.  Code Status:     Code Status Orders        Start     Ordered   10/30/15 1511  Do not attempt resuscitation (DNR)   Continuous    Question Answer Comment  In the event of cardiac or respiratory ARREST Do not call a "code blue"   In the event of cardiac or respiratory ARREST Do not perform Intubation, CPR, defibrillation or ACLS   In the event  of cardiac or respiratory ARREST Use medication by any route, position,  wound care, and other measures to relive pain and suffering. May use oxygen, suction and manual treatment of airway obstruction as needed for comfort.   Comments DNR/ DNI      10/30/15 1510    Code Status History    Date Active Date Inactive Code Status Order ID Comments User Context   10/30/2015  2:18 PM 10/30/2015  3:10 PM DNR [id number]  Colleen Can, MD Inpatient   10/20/2015  5:14 PM 10/30/2015  2:18 PM Full Code [id number]  Fritzi Mandes, MD Inpatient     Disposition Plan: To be determined  Consultants:  Nephrology  Neurology  Critical care  Vascular surgery  Antibiotics:  Cefepime  Time spent: 30 minutes.   Loletha Grayer  [facility]

## 2015-10-31 NOTE — Progress Notes (Addendum)
Palliative Care Update   I came in briefly today before leaving town and was informed that pt's uncle, who made the DNR decision when I called him, is said to have a dementia.  This information is from pts 'work family'.  They were apparently quite concerned that he made such a decision.  I was unaware of this gentleman having such a presumed diagnosis, but when I spoke with him, he was able to converse in such a way that it seemed he had a good grasp of what I was telling him and he seemed to have the capacity to choose between Full Code and DNR.  He said to me, "She wouldn't want to come back and not have her mind back."  He also said, "I know I am her closest relative, and so I guess this decision falls on me.".  He did have to have code status explained but when I asked him if he had ever seen 'code blues' on TV, he was able to relate that he had seen this on hospital shows and we talked further about this.  I had no reason to suspect that he might have a dementia of significant degree.  However, it seems he has been telling people she is actively dying now, and that is not something that was communicated to him, so perhaps he has gotten some facts incorrect.   Given that there are no documents giving decisions to others, the decision makers are to be her available relatives.  Legally,while pt cannot make decisions,  the uncle is the decision maker, unless his ability to make decisions is in question. Then, it falls to the uncle's son (pts only known available cousin).  THEN--AFTER HIM--  it falls to the 'work family' (involved friends), and then to the physicians caring for the pt.    BUT it seems the pt is starting to answer some questions. Perhaps she could soon indicate her wishes.   I am going to discontinue the DNR and plan to re-address this when I return on Feb 7th-- due to the concerns that have arisen.  In the meantime, perhaps pt can be asked her wishes about code status (IF she is becoming  more oriented).   There is not an urgency about obtaining a DNR at this time as pt is improving.  Although we cannot say that trend will continue.     Colleen Can, MD

## 2015-10-31 NOTE — Progress Notes (Signed)
[facility]  ROUNDING NOTE   Subjective:   Has not required hemodialysis in over a week. Dialysis catheter removed Now, UOP > 4900 Extubated thursday , required BiPAP on Friday, currently on [place] Eyes are open. Able to follow simple commands Serum creatinine has further improved however, sodium level has worsened today despite D5W supplementation  Objective:  Vital signs in last 24 hours:  Temp:  [99.1 F (37.3 C)-101.7 F (38.7 C)] 99.9 F (37.7 C) (02/04 0900) Pulse Rate:  [76-95] 76 (02/04 0900) Resp:  [27-38] 34 (02/04 0900) BP: (149-172)/(56-73) 155/69 mmHg (02/04 0900) SpO2:  [87 %-100 %] 87 % (02/04 0900) FiO2 (%):  [35 %] 35 % (02/03 2000) Weight:  [122.6 kg (270 lb 4.5 oz)] 122.6 kg (270 lb 4.5 oz) (02/04 0500)  Weight change: -6.9 kg (-15 lb 3.4 oz) Filed Weights   10/29/15 0536 10/30/15 0500 10/31/15 0500  Weight: 126.3 kg (278 lb 7.1 oz) 129.5 kg (285 lb 7.9 oz) 122.6 kg (270 lb 4.5 oz)    Intake/Output: I/O last 3 completed shifts: In: 1570.8 [I.V.:1420.8; IV Piggyback:150] Out: 6578 [Urine:7830]   Intake/Output this shift:  Total I/O In: 350 [I.V.:250; IV Piggyback:100] Out: 450 [Urine:450]  Physical Exam: General: Critically ill appearing   Head: normocephalic  Eyes: Eyes open   Neck: No masses  Lungs:   [place] oxygen, tachypneic  Heart: Regular, no rub   Abdomen:  Soft, nontender, obese  Extremities: 3+ peripheral edema. +anasarca  Neurologic:  alert, able to follow simple commands   Skin: Feet in soft support, necrosis in toes  Access: RIJ vascath 10/20/15 Dr. Stevenson Clinch    Basic Metabolic Panel:  Recent Labs Lab 10/26/15 0400  10/28/15 0443 10/29/15 0421 10/29/15 1437 10/30/15 0431 10/31/15 0500 10/31/15 0649  NA 145  < > 152* 154* 155* 157* 161*  --   K 3.2*  < > 3.9 4.1 4.8 3.7 3.2*  --   CL 110  < > 119* 123* 121* 121* 125*  --   CO2 29  < > '29 30 31 31 30  '$ --   GLUCOSE 232*  < > 209* 211* 212* 171* 205*  --   BUN 89*  < >  71* 61* 59* 54* 49*  --   CREATININE 2.11*  < > 1.60* 1.40* 1.37* 1.33* 1.25*  --   CALCIUM 7.5*  < > 7.7* 7.8* 8.0* 8.1* 8.0*  --   MG 2.3  --  2.3  --   --  2.1  --  2.0  PHOS 4.5  --   --   --   --  3.3  --   --   < > = values in this interval not displayed.  Liver Function Tests:  Recent Labs Lab 10/26/15 0400  AST 92*  ALT 174*  ALKPHOS 188*  BILITOT 2.1*  PROT 5.3*  ALBUMIN 1.7*   No results for input(s): LIPASE, AMYLASE in the last 168 hours.  Recent Labs Lab 10/31/15 0649  AMMONIA 18    CBC:  Recent Labs Lab 10/27/15 0433 10/28/15 0443 10/29/15 0421 10/30/15 0431 10/31/15 0500  WBC 27.6* 26.2* 31.0* 29.4* 28.4*  HGB 10.0* 9.7* 9.4* 10.0* 9.9*  HCT 30.4* 29.9* 29.0* 30.8* 30.6*  MCV 91.0 93.0 92.1 94.1 93.7  PLT 82* 109* 132* 181 211    Cardiac Enzymes: No results for input(s): CKTOTAL, CKMB, CKMBINDEX, TROPONINI in the last 168 hours.  BNP: Invalid input(s): POCBNP  CBG:  Recent Labs Lab 10/30/15  2046 10/30/15 2335 10/31/15 0328 10/31/15 0744 10/31/15 1131  GLUCAP 169* 174* 197* 180* 198*    Microbiology: Results for orders placed or performed during the hospital encounter of 10/20/15  Urine culture     Status: None   Collection Time: 10/20/15  9:57 AM  Result Value Ref Range Status   Specimen Description URINE, RANDOM  Final   Special Requests NONE  Final   Culture >=100,000 COLONIES/mL PROTEUS MIRABILIS  Final   Report Status 10/22/2015 FINAL  Final   Organism ID, Bacteria PROTEUS MIRABILIS  Final      Susceptibility   Proteus mirabilis - MIC*    AMPICILLIN <=2 SENSITIVE Sensitive     GENTAMICIN <=1 SENSITIVE Sensitive     CEFTRIAXONE Value in next row Sensitive      SENSITIVE<=1    CIPROFLOXACIN Value in next row Sensitive      SENSITIVE<=0.25    IMIPENEM Value in next row Sensitive      SENSITIVE2    NITROFURANTOIN Value in next row Resistant      RESISTANT128    TRIMETH/SULFA Value in next row Sensitive       SENSITIVE<=20    PIP/TAZO Value in next row Sensitive      SENSITIVE<=4    AMPICILLIN/SULBACTAM Value in next row Sensitive      SENSITIVE<=2    * >=100,000 COLONIES/mL PROTEUS MIRABILIS  Culture, blood (routine x 2)     Status: None   Collection Time: 10/20/15  9:57 AM  Result Value Ref Range Status   Specimen Description BLOOD RIGHT WRIST  Final   Special Requests   Final    BOTTLES DRAWN AEROBIC AND ANAEROBIC ANA 1ML AER 4ML   Culture  Setup Time   Final    GRAM NEGATIVE RODS IN BOTH AEROBIC AND ANAEROBIC BOTTLES CRITICAL RESULT CALLED TO, READ BACK BY AND VERIFIED WITH: MATT MCBANE ON 10/21/15 AT 0005 [facility] CONFIRMED BY [place]    Culture   Final    PROTEUS MIRABILIS IN BOTH AEROBIC AND ANAEROBIC BOTTLES    Report Status 10/22/2015 FINAL  Final   Organism ID, Bacteria PROTEUS MIRABILIS  Final      Susceptibility   Proteus mirabilis - MIC*    AMPICILLIN/SULBACTAM Value in next row Sensitive      SENSITIVE<=2    PIP/TAZO Value in next row Sensitive      SENSITIVE<=4    CEFTRIAXONE Value in next row Sensitive      SENSITIVE<=1    IMIPENEM Value in next row Sensitive      SENSITIVE2    GENTAMICIN Value in next row Sensitive      SENSITIVE<=1    CIPROFLOXACIN Value in next row Sensitive      SENSITIVE<=0.25    * PROTEUS MIRABILIS  Culture, blood (routine x 2)     Status: None   Collection Time: 10/20/15  9:57 AM  Result Value Ref Range Status   Specimen Description BLOOD LEFT HAND  Final   Special Requests   Final    BOTTLES DRAWN AEROBIC AND ANAEROBIC AER 3ML ANA 2ML   Culture  Setup Time   Final    GRAM NEGATIVE RODS IN BOTH AEROBIC AND ANAEROBIC BOTTLES CRITICAL VALUE NOTED.  VALUE IS CONSISTENT WITH PREVIOUSLY REPORTED AND CALLED VALUE.    Culture   Final    PROTEUS MIRABILIS IN BOTH AEROBIC AND ANAEROBIC BOTTLES    Report Status 10/22/2015 FINAL  Final   Organism ID, Bacteria PROTEUS MIRABILIS  Final      Susceptibility   Proteus mirabilis - MIC*    AMPICILLIN  Value in next row Sensitive      SENSITIVE<=2    PIP/TAZO Value in next row Sensitive      SENSITIVE<=4    CEFTAZIDIME Value in next row Sensitive      SENSITIVE<=1    CEFTRIAXONE Value in next row Sensitive      SENSITIVE<=1    CEFEPIME Value in next row Sensitive      SENSITIVE<=1    IMIPENEM Value in next row Sensitive      SENSITIVE2    GENTAMICIN Value in next row Sensitive      SENSITIVE<=1    CIPROFLOXACIN Value in next row Sensitive      SENSITIVE<=0.25    * PROTEUS MIRABILIS  Blood Culture ID Panel (Reflexed)     Status: Abnormal   Collection Time: 10/20/15  9:57 AM  Result Value Ref Range Status   Enterococcus species NOT DETECTED NOT DETECTED Final   Listeria monocytogenes NOT DETECTED NOT DETECTED Final   Staphylococcus species NOT DETECTED NOT DETECTED Final   Staphylococcus aureus NOT DETECTED NOT DETECTED Final   Streptococcus species NOT DETECTED NOT DETECTED Final   Streptococcus agalactiae NOT DETECTED NOT DETECTED Final   Streptococcus pneumoniae NOT DETECTED NOT DETECTED Final   Streptococcus pyogenes NOT DETECTED NOT DETECTED Final   Acinetobacter baumannii NOT DETECTED NOT DETECTED Final   Enterobacteriaceae species DETECTED (A) NOT DETECTED Final    Comment: CRITICAL RESULT CALLED TO, READ BACK BY AND VERIFIED WITH: MATT MCBANE ON 10/21/15 AT 0005 [place]    Enterobacter cloacae complex NOT DETECTED NOT DETECTED Final   Escherichia coli NOT DETECTED NOT DETECTED Final   Klebsiella oxytoca NOT DETECTED NOT DETECTED Final   Klebsiella pneumoniae NOT DETECTED NOT DETECTED Final   Proteus species (A) NOT DETECTED Final    CRITICAL RESULT CALLED TO, READ BACK BY AND VERIFIED WITH:    Comment: MATT MCBANE ON 10/21/15 AT 0005 [place]   Serratia marcescens NOT DETECTED NOT DETECTED Final   Haemophilus influenzae NOT DETECTED NOT DETECTED Final   Neisseria meningitidis NOT DETECTED NOT DETECTED Final   Pseudomonas aeruginosa NOT DETECTED NOT DETECTED Final   Candida  albicans NOT DETECTED NOT DETECTED Final   Candida glabrata NOT DETECTED NOT DETECTED Final   Candida krusei NOT DETECTED NOT DETECTED Final   Candida parapsilosis NOT DETECTED NOT DETECTED Final   Candida tropicalis NOT DETECTED NOT DETECTED Final   Carbapenem resistance NOT DETECTED NOT DETECTED Final   Methicillin resistance NOT DETECTED NOT DETECTED Final   Vancomycin resistance NOT DETECTED NOT DETECTED Final  MRSA PCR Screening     Status: None   Collection Time: 10/20/15  7:41 PM  Result Value Ref Range Status   MRSA by PCR NEGATIVE NEGATIVE Final    Comment:        The GeneXpert MRSA Assay (FDA approved for NASAL specimens only), is one component of a comprehensive MRSA colonization surveillance program. It is not intended to diagnose MRSA infection nor to guide or monitor treatment for MRSA infections.   CULTURE, BLOOD (ROUTINE X 2) w Reflex to PCR ID Panel     Status: None   Collection Time: 10/22/15 11:39 AM  Result Value Ref Range Status   Specimen Description BLOOD LEFT HAND  Final   Special Requests BOTTLES DRAWN AEROBIC AND ANAEROBIC 1CC  Final   Culture NO GROWTH 5 DAYS  Final  Report Status 10/27/2015 FINAL  Final  Culture, expectorated sputum-assessment     Status: None   Collection Time: 10/22/15 12:00 PM  Result Value Ref Range Status   Specimen Description SPUTUM  Final   Special Requests Normal  Final   Sputum evaluation THIS SPECIMEN IS ACCEPTABLE FOR SPUTUM CULTURE  Final   Report Status 10/22/2015 FINAL  Final  Culture, respiratory (NON-Expectorated)     Status: None   Collection Time: 10/22/15 12:00 PM  Result Value Ref Range Status   Specimen Description SPUTUM  Final   Special Requests Normal Reflexed from [id number]  Final   Gram Stain   Final    FAIR SPECIMEN - 70-80% WBCS FEW WBC SEEN RARE YEAST RARE GRAM NEGATIVE RODS    Culture LIGHT GROWTH ENTEROBACTER AEROGENES  Final   Report Status 10/24/2015 FINAL  Final   Organism ID, Bacteria  ENTEROBACTER AEROGENES  Final      Susceptibility   Enterobacter aerogenes - MIC*    CEFAZOLIN >=64 RESISTANT Resistant     CEFEPIME <=1 SENSITIVE Sensitive     CEFTAZIDIME <=1 SENSITIVE Sensitive     CEFTRIAXONE <=1 SENSITIVE Sensitive     CIPROFLOXACIN <=0.25 SENSITIVE Sensitive     GENTAMICIN <=1 SENSITIVE Sensitive     IMIPENEM 2 SENSITIVE Sensitive     TRIMETH/SULFA <=20 SENSITIVE Sensitive     PIP/TAZO <=4 SENSITIVE Sensitive     * LIGHT GROWTH ENTEROBACTER AEROGENES  Urine culture     Status: None   Collection Time: 10/22/15 12:45 PM  Result Value Ref Range Status   Specimen Description URINE, RANDOM  Final   Special Requests NONE  Final   Culture NO GROWTH 2 DAYS  Final   Report Status 10/24/2015 FINAL  Final  Urine culture     Status: None   Collection Time: 10/28/15  2:36 PM  Result Value Ref Range Status   Specimen Description URINE, RANDOM  Final   Special Requests NONE  Final   Culture NO GROWTH 2 DAYS  Final   Report Status 10/30/2015 FINAL  Final    Coagulation Studies: No results for input(s): LABPROT, INR in the last 72 hours.  Urinalysis:  Recent Labs  10/28/15 1437  COLORURINE YELLOW*  LABSPEC 1.014  PHURINE 5.0  GLUCOSEU 50*  HGBUR 1+*  BILIRUBINUR NEGATIVE  KETONESUR NEGATIVE  PROTEINUR 30*  NITRITE NEGATIVE  LEUKOCYTESUR TRACE*      Imaging: Dg Chest 1 View  10/31/2015  CLINICAL DATA:  Dyspnea. EXAM: CHEST 1 VIEW COMPARISON:  10/30/2015 FINDINGS: Right jugular central venous catheter terminates over the lower SVC/ cavoatrial junction, unchanged. Left jugular catheter terminates over the mid SVC. Cardiac silhouette is partially obscured but grossly unchanged. Thoracic aortic calcification is noted. Lung volumes remain diminished with pulmonary vascular congestion the and mild interstitial edema. Bibasilar opacities are unchanged and likely reflect a combination of atelectasis and small pleural effusions. Old left rib fractures. IMPRESSION:  Pulmonary edema, bibasilar atelectasis, and small bilateral pleural effusions. No significant interval change. Electronically Signed   By: Logan Bores M.D.   On: 10/31/2015 07:22   Dg Chest 1 View  10/30/2015  CLINICAL DATA:  Dyspnea, sepsis, acute renal failure, acute MI. EXAM: CHEST 1 VIEW COMPARISON:  Portable chest x-ray of October 29, 2015 FINDINGS: There has been interval extubation of the trachea and of the esophagus. The lungs are borderline hypoinflated. The pulmonary interstitial markings remain increased bilaterally. The cardiac silhouette remains enlarged. The pulmonary vascularity remains engorged but is  slightly more distinct. The hemidiaphragms are partially obscured likely due to small amounts of pleural fluid especially on the left. The right internal jugular catheter tip projects over the distal third of the SVC. The smaller caliber left internal jugular catheter projects over the midportion of the SVC. IMPRESSION: Interval extubation of the trachea and esophagus. Persistent pulmonary interstitial edema and likely small bilateral pleural effusions. Slight interval improvement in pulmonary vascular congestion. Electronically Signed   By: David  Martinique M.D.   On: 10/30/2015 07:24     Medications:   . dextrose 125 mL/hr at 10/31/15 1216  . norepinephrine (LEVOPHED) Adult infusion Stopped (10/25/15 2259)  . vasopressin (PITRESSIN) infusion - *FOR SHOCK* Stopped (10/23/15 0950)   . ceFEPime (MAXIPIME) IV  2 g Intravenous Q12H  . chlorhexidine gluconate  15 mL Mouth Rinse BID  . insulin aspart  0-15 Units Subcutaneous 6 times per day  . ipratropium-albuterol  3 mL Nebulization Q6H  . pantoprazole (PROTONIX) IV  40 mg Intravenous Q24H  . potassium chloride  10 mEq Intravenous Q1 Hr x 3   [DISCONTINUED] acetaminophen **OR** acetaminophen, bisacodyl, fentaNYL (SUBLIMAZE) injection, iohexol, labetalol, LORazepam, morphine injection, ondansetron (ZOFRAN) IV, prochlorperazine, sodium  chloride flush  Assessment/ Plan:  [name] is a 70 y.o. white female with DM (A1c 7.7%),  who was admitted to [facility] on 10/20/2015  1. Acute renal failure with metabolic acidosis:  - Unknown baseline creatinine. Admit Cr 3.01 Required CRRT from 1/24 to 1/27. Then intermittent hemodialysis 1/28.  - Acute renal failure from sepsis leading to ATN. Serologic testing negative 10/21/15 - Now nonoliguric with post-ATN diuresis. Urine output 4900 cc   2. 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 Hypokalemia: post ATN diuresis.  - potassium being replaced. prn  4. 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   6. Hypernatremia - from post ATN diuresis - increase free water replacement  -  deficit > 5 L - continue iv D5W although have to be careful with tenuous resp status  7. Acute resp failure - Extubated February 2, currently on [place]  8. Anasarca - Good diuresis without lasix   LOS: 11 Keajah Killough 2/4/201712:51 PM

## 2015-10-31 NOTE — Progress Notes (Signed)
[place] Progress Note Patient Name: April [name] DOB: [birth year] MRN: [id number]   Date of Service  10/31/2015  HPI/Events of Note  Nurse reports being able to flush but not able to get blood return from all 3 ports on the L IJ central venous catheter. This is very unusual.   eICU Interventions  Will order: 1. Cathflo for distal (brown) port. If this works will order for the other 2 ports.      Intervention Category Intermediate Interventions: Other:  Sommer,Steven Cornelia Copa 10/31/2015, 5:26 PM

## 2015-10-31 NOTE — Progress Notes (Signed)
Called [facility] and spoke w/ Dr. Tamala Julian r/t pt.'s HTN.  Discussed pt.'s Dx, code status and current condition.  Suggested a PRN medicine, no new orders at this time. New parameter of SBP > 180 call MD.   Will continue to monitor pt. Closely.

## 2015-10-31 NOTE — Progress Notes (Signed)
Two attempts were made to place NG tube. By Noah Delaine. And [facility].  Both attempts were unsuccessful. Pt refusing another attempt.  [facility] notified awaiting orders. Will continue to assess.

## 2015-10-31 NOTE — Progress Notes (Signed)
[facility] Progress Note Patient Name: April [name] DOB: Nov 22, 1945 MRN: [id number]   Date of Service  10/31/2015  HPI/Events of Note  Hypernatremia.  Sodium rising despite d5w at 75 cc/hr  eICU Interventions  Increase d5w to 125 cc/hr     Intervention Category Minor Interventions: Electrolytes abnormality - evaluation and management  Mauri [place], P 10/31/2015, 6:08 AM

## 2015-10-31 NOTE — Progress Notes (Signed)
[facility] Progress Note Patient Name: April [name] DOB: 01/25/1946 MRN: [id number]   Date of Service  10/31/2015  HPI/Events of Note  Multiple issues: 1. Blood glucose = 279. 2. K+ replacement done and 3. Blue port on central venous line sluggish flow.   eICU Interventions  Will order: 1. Insulin IV infusion as already ordered.  2. BMP now.  3. Pharmacy consult for electrolyte replacement.  4. Cathflo to Blue port on central venous line.      Intervention Category Major Interventions: Electrolyte abnormality - evaluation and management;Other:  Lysle Dingwall 10/31/2015, 8:26 PM

## 2015-11-01 ENCOUNTER — Inpatient Hospital Stay: Payer: Medicare HMO

## 2015-11-01 LAB — BLOOD GAS, ARTERIAL
Acid-Base Excess: 4.4 mmol/L — ABNORMAL HIGH (ref 0.0–3.0)
Allens test (pass/fail): POSITIVE — AB
BICARBONATE: 28.4 meq/L — AB (ref 21.0–28.0)
FIO2: 0.28
O2 Saturation: 93.2 %
PH ART: 7.47 — AB (ref 7.350–7.450)
Patient temperature: 37
pCO2 arterial: 39 mmHg (ref 32.0–48.0)
pO2, Arterial: 63 mmHg — ABNORMAL LOW (ref 83.0–108.0)

## 2015-11-01 LAB — GLUCOSE, CAPILLARY
GLUCOSE-CAPILLARY: 109 mg/dL — AB (ref 65–99)
GLUCOSE-CAPILLARY: 132 mg/dL — AB (ref 65–99)
GLUCOSE-CAPILLARY: 139 mg/dL — AB (ref 65–99)
GLUCOSE-CAPILLARY: 149 mg/dL — AB (ref 65–99)
GLUCOSE-CAPILLARY: 157 mg/dL — AB (ref 65–99)
GLUCOSE-CAPILLARY: 158 mg/dL — AB (ref 65–99)
GLUCOSE-CAPILLARY: 158 mg/dL — AB (ref 65–99)
GLUCOSE-CAPILLARY: 167 mg/dL — AB (ref 65–99)
GLUCOSE-CAPILLARY: 171 mg/dL — AB (ref 65–99)
GLUCOSE-CAPILLARY: 182 mg/dL — AB (ref 65–99)
GLUCOSE-CAPILLARY: 196 mg/dL — AB (ref 65–99)
Glucose-Capillary: 130 mg/dL — ABNORMAL HIGH (ref 65–99)
Glucose-Capillary: 138 mg/dL — ABNORMAL HIGH (ref 65–99)
Glucose-Capillary: 138 mg/dL — ABNORMAL HIGH (ref 65–99)
Glucose-Capillary: 142 mg/dL — ABNORMAL HIGH (ref 65–99)
Glucose-Capillary: 144 mg/dL — ABNORMAL HIGH (ref 65–99)
Glucose-Capillary: 149 mg/dL — ABNORMAL HIGH (ref 65–99)
Glucose-Capillary: 161 mg/dL — ABNORMAL HIGH (ref 65–99)
Glucose-Capillary: 168 mg/dL — ABNORMAL HIGH (ref 65–99)
Glucose-Capillary: 174 mg/dL — ABNORMAL HIGH (ref 65–99)
Glucose-Capillary: 181 mg/dL — ABNORMAL HIGH (ref 65–99)
Glucose-Capillary: 186 mg/dL — ABNORMAL HIGH (ref 65–99)
Glucose-Capillary: 188 mg/dL — ABNORMAL HIGH (ref 65–99)
Glucose-Capillary: 224 mg/dL — ABNORMAL HIGH (ref 65–99)

## 2015-11-01 LAB — BASIC METABOLIC PANEL
ANION GAP: 3 — AB (ref 5–15)
ANION GAP: 3 — AB (ref 5–15)
BUN: 39 mg/dL — AB (ref 6–20)
BUN: 34 mg/dL — AB (ref 6–20)
CALCIUM: 7.6 mg/dL — AB (ref 8.9–10.3)
CO2: 30 mmol/L (ref 22–32)
CO2: 27 mmol/L (ref 22–32)
CREATININE: 1.09 mg/dL — AB (ref 0.44–1.00)
Calcium: 7.6 mg/dL — ABNORMAL LOW (ref 8.9–10.3)
Chloride: 123 mmol/L — ABNORMAL HIGH (ref 101–111)
Chloride: 121 mmol/L — ABNORMAL HIGH (ref 101–111)
Creatinine, Ser: 1.12 mg/dL — ABNORMAL HIGH (ref 0.44–1.00)
GFR calc Af Amer: 59 mL/min — ABNORMAL LOW (ref >60)
GFR calc non Af Amer: 51 mL/min — ABNORMAL LOW (ref >60)
GFR, EST AFRICAN AMERICAN: 57 mL/min — AB (ref >60)
GFR, EST NON AFRICAN AMERICAN: 49 mL/min — AB (ref >60)
GLUCOSE: 144 mg/dL — AB (ref 65–99)
Glucose, Bld: 185 mg/dL — ABNORMAL HIGH (ref 65–99)
POTASSIUM: 3.4 mmol/L — AB (ref 3.5–5.1)
Potassium: 3.3 mmol/L — ABNORMAL LOW (ref 3.5–5.1)
SODIUM: 151 mmol/L — AB (ref 135–145)
Sodium: 156 mmol/L — ABNORMAL HIGH (ref 135–145)

## 2015-11-01 LAB — PHOSPHORUS: Phosphorus: 3.2 mg/dL (ref 2.5–4.6)

## 2015-11-01 LAB — MAGNESIUM: Magnesium: 2 mg/dL (ref 1.7–2.4)

## 2015-11-01 MED ORDER — POTASSIUM CHLORIDE 10 MEQ/100ML IV SOLN
10.0000 meq | INTRAVENOUS | Status: AC
  Administered 2015-11-01 (×3): 10 meq via INTRAVENOUS
  Filled 2015-11-01 (×3): qty 100

## 2015-11-01 MED ORDER — CHLORHEXIDINE GLUCONATE 0.12 % MT SOLN
15.0000 mL | Freq: Two times a day (BID) | OROMUCOSAL | Status: DC
  Administered 2015-11-01 – 2015-11-04 (×6): 15 mL via OROMUCOSAL
  Filled 2015-11-01 (×3): qty 15

## 2015-11-01 MED ORDER — HEPARIN SODIUM (PORCINE) 5000 UNIT/ML IJ SOLN
5000.0000 [IU] | Freq: Two times a day (BID) | INTRAMUSCULAR | Status: DC
  Administered 2015-11-01 – 2015-11-03 (×5): 5000 [IU] via SUBCUTANEOUS
  Filled 2015-11-01 (×5): qty 1

## 2015-11-01 MED ORDER — CETYLPYRIDINIUM CHLORIDE 0.05 % MT LIQD
7.0000 mL | Freq: Two times a day (BID) | OROMUCOSAL | Status: DC
  Administered 2015-11-02 – 2015-11-04 (×4): 7 mL via OROMUCOSAL

## 2015-11-01 MED ORDER — POTASSIUM CHLORIDE 10 MEQ/100ML IV SOLN
10.0000 meq | INTRAVENOUS | Status: AC
  Administered 2015-11-01 (×4): 10 meq via INTRAVENOUS
  Filled 2015-11-01 (×4): qty 100

## 2015-11-01 NOTE — Progress Notes (Signed)
[facility] NOTE  Pharmacy Consult for Electrolyte Management    Allergies  Allergen Reactions  . Prednisone Anaphylaxis    Patient Measurements: Height: '5\' 7"'$  (170.2 cm) Weight: 270 lb 4.5 oz (122.6 kg) IBW/kg (Calculated) : 61.6   Vital Signs: Temp: 99.1 F (37.3 C) (02/05 0500) Temp Source: Core (Comment) (02/05 0400) BP: 149/59 mmHg (02/05 0500) Pulse Rate: 88 (02/05 0500) Intake/Output from previous day: 02/04 0701 - 02/05 0700 In: 3649 [I.V.:2649; IV Piggyback:1000] Out: 2585 [Urine:2585] Intake/Output from this shift: Total I/O In: 1624 [I.V.:1274; IV Piggyback:350] Out: 910 [Urine:910] Vent settings for last 24 hours:    Labs:  Recent Labs  10/30/15 0431 10/31/15 0500 10/31/15 0649 10/31/15 1708 10/31/15 2124 11/01/15 0455  WBC 29.4* 28.4*  --   --   --   --   HGB 10.0* 9.9*  --   --   --   --   HCT 30.8* 30.6*  --   --   --   --   PLT 181 211  --   --   --   --   CREATININE 1.33* 1.25*  --  1.27* 1.22* 1.09*  MG 2.1  --  2.0  --   --  2.0  PHOS 3.3  --   --   --   --  3.2   Estimated Creatinine Clearance: 66.1 mL/min (by C-G formula based on Cr of 1.09).   Recent Labs  11/01/15 0324 11/01/15 0427 11/01/15 0507  GLUCAP 130* 142* 109*    Assessment: 70 y/o F with septic shock from Proteus UTI.   K remains low at 3.2 after replacement this morning and has decreased from 3.4 this afternoon.   Plan:  K 3.3, Mg 2, Phos 3.2. Ordered potassium chloride IV 10 mEq IV Q1H x 4 doses. Will check BMP again this afternoon.  Pharmacy will continue to monitor and adjust per consult.    Ski Polich A. [place], [place].D., BCPS Clinical Pharmacist   11/01/2015
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
## 2015-11-01 NOTE — Progress Notes (Signed)
Patient with increased work of breathing. Dr. Stevenson Clinch in to see patient and ABG, chest x-ray and morphine ordered.

## 2015-11-01 NOTE — Progress Notes (Signed)
Patient ID: April [name], female   DOB: 1946-08-10, 70 y.o.   MRN: [id number] [facility] PROGRESS NOTE  STEPH CHEADLE TGY:[id number] DOB: 13-Jul-1946 DOA: 10/20/2015 PCP: Albina Billet, MD  HPI/Subjective: Patient answers some yes or no questions. Offers no complaints. Patient taking short shallow breaths but doesn't complain of any shortness of breath.  Objective: Filed Vitals:   11/01/15 1100 11/01/15 1200  BP: 139/57 144/63  Pulse: 94 95  Temp: 99.5 F (37.5 C) 99.7 F (37.6 C)  Resp: 28 31    Filed Weights   10/30/15 0500 10/31/15 0500 11/01/15 0500  Weight: 129.5 kg (285 lb 7.9 oz) 122.6 kg (270 lb 4.5 oz) 121 kg (266 lb 12.1 oz)    ROS: Review of Systems  Constitutional: Negative for fever and chills.  Eyes: Negative for blurred vision.  Respiratory: Negative for cough and shortness of breath.   Cardiovascular: Negative for chest pain.  Gastrointestinal: Negative for nausea, vomiting, abdominal pain, diarrhea and constipation.  Genitourinary: Negative for dysuria.  Musculoskeletal: Negative for joint pain.  Neurological: Negative for dizziness and headaches.   Exam: Physical Exam  HENT:  Nose: No mucosal edema.  Mouth/Throat: No oropharyngeal exudate or posterior oropharyngeal edema.  Eyes: Conjunctivae, EOM and lids are normal. Pupils are equal, round, and reactive to light.  Neck: No JVD present. Carotid bruit is not present. No edema present. No thyroid mass and no thyromegaly present.  Cardiovascular: S1 normal and S2 normal.  Exam reveals no gallop.   No murmur heard. Pulses:      Dorsalis pedis pulses are 2+ on the right side, and 2+ on the left side.  Respiratory: No respiratory distress. She has decreased breath sounds in the right lower field and the left lower field. She has no wheezes. She has no rhonchi. She has rales in the right lower field and the left lower field.  GI: Soft. Bowel sounds are normal. There is no tenderness.   Musculoskeletal:       Right ankle: She exhibits swelling.       Left ankle: She exhibits swelling.  Lymphadenopathy:    She has no cervical adenopathy.  Neurological: She is alert.  Just barely able to lift arms up off the bed.  Skin: Skin is warm. Nails show no clubbing.  Right leg demarcated halfway up the lower leg. Gangrene all the toes and right foot. Left foot demarcated and left toes gangrenous.  Psychiatric: She has a normal mood and affect.      Data Reviewed: Basic Metabolic Panel:  Recent Labs Lab 10/26/15 0400  10/28/15 0443  10/30/15 0431 10/31/15 0500 10/31/15 0649 10/31/15 1337 10/31/15 1708 10/31/15 2124 11/01/15 0455 11/01/15 1300  NA 145  < > 152*  < > 157* 161*  --   --  155* 157* 156* 151*  K 3.2*  < > 3.9  < > 3.7 3.2*  --  3.4* 3.2* 3.4* 3.3* 3.4*  CL 110  < > 119*  < > 121* 125*  --   --  120* 122* 123* 121*  CO2 29  < > 29  < > 31 30  --   --  '29 31 30 27  '$ GLUCOSE 232*  < > 209*  < > 171* 205*  --   --  279* 247* 144* 185*  BUN 89*  < > 71*  < > 54* 49*  --   --  42* 40* 39* 34*  CREATININE 2.11*  < >  1.60*  < > 1.33* 1.25*  --   --  1.27* 1.22* 1.09* 1.12*  CALCIUM 7.5*  < > 7.7*  < > 8.1* 8.0*  --   --  7.8* 7.8* 7.6* 7.6*  MG 2.3  --  2.3  --  2.1  --  2.0  --   --   --  2.0  --   PHOS 4.5  --   --   --  3.3  --   --   --   --   --  3.2  --   < > = values in this interval not displayed. Liver Function Tests:  Recent Labs Lab 10/26/15 0400  AST 92*  ALT 174*  ALKPHOS 188*  BILITOT 2.1*  PROT 5.3*  ALBUMIN 1.7*    Recent Labs Lab 10/31/15 0649  AMMONIA 18   CBC:  Recent Labs Lab 10/27/15 0433 10/28/15 0443 10/29/15 0421 10/30/15 0431 10/31/15 0500  WBC 27.6* 26.2* 31.0* 29.4* 28.4*  HGB 10.0* 9.7* 9.4* 10.0* 9.9*  HCT 30.4* 29.9* 29.0* 30.8* 30.6*  MCV 91.0 93.0 92.1 94.1 93.7  PLT 82* 109* 132* 181 211   BNP (last 3 results)  Recent Labs  10/31/15 0649  BNP 268.0*     CBG:  Recent Labs Lab 11/01/15 1022  11/01/15 1123 11/01/15 1236 11/01/15 1340 11/01/15 1423  GLUCAP 168* 188* 171* 181* 144*    Recent Results (from the past 240 hour(s))  Urine culture     Status: None   Collection Time: 10/28/15  2:36 PM  Result Value Ref Range Status   Specimen Description URINE, RANDOM  Final   Special Requests NONE  Final   Culture NO GROWTH 2 DAYS  Final   Report Status 10/30/2015 FINAL  Final     Studies: Dg Chest 1 View  11/01/2015  CLINICAL DATA:  70 year old female with acute shortness of Breath, found unresponsive at the end of January. Sepsis. Initial encounter. EXAM: CHEST 1 VIEW COMPARISON:  10/31/2015 and earlier. FINDINGS: Portable AP semi upright view at 1204 hours. Large body habitus. Bilateral IJ central lines appear stable. No endotracheal tube or enteric tube. Since 10/20/2015 bilateral small pleural effusions have developed but are stable to mildly improved since yesterday, and improved since 10/30/2015. No pneumothorax. Stable pulmonary vascularity without overt edema. Stable cardiac size and mediastinal contours. No air bronchograms identified to suggest consolidation. IMPRESSION: Bilateral pleural effusions appear small, and somewhat improved since 10/30/2015. Electronically Signed   By: Genevie Ann M.D.   On: 11/01/2015 13:48   Dg Chest 1 View  10/31/2015  CLINICAL DATA:  Dyspnea. EXAM: CHEST 1 VIEW COMPARISON:  10/30/2015 FINDINGS: Right jugular central venous catheter terminates over the lower SVC/ cavoatrial junction, unchanged. Left jugular catheter terminates over the mid SVC. Cardiac silhouette is partially obscured but grossly unchanged. Thoracic aortic calcification is noted. Lung volumes remain diminished with pulmonary vascular congestion the and mild interstitial edema. Bibasilar opacities are unchanged and likely reflect a combination of atelectasis and small pleural effusions. Old left rib fractures. IMPRESSION: Pulmonary edema, bibasilar atelectasis, and small bilateral pleural  effusions. No significant interval change. Electronically Signed   By: Logan Bores M.D.   On: 10/31/2015 07:22    Scheduled Meds: . ceFEPime (MAXIPIME) IV  2 g Intravenous Q12H  . chlorhexidine gluconate  15 mL Mouth Rinse BID  . free water  200 mL Per Tube Q4H  . ipratropium-albuterol  3 mL Nebulization Q6H  . pantoprazole (PROTONIX) IV  40 mg Intravenous Q24H   Continuous Infusions: . dextrose 125 mL/hr at 11/01/15 0700  . insulin (NOVOLIN-R) infusion 3.6 Units/hr (11/01/15 1342)  . norepinephrine (LEVOPHED) Adult infusion Stopped (10/25/15 2259)  . vasopressin (PITRESSIN) infusion - *FOR SHOCK* Stopped (10/23/15 0950)    Assessment/Plan:  1. Septic shock due to Proteus bacteremia. Sputum culture also positive for Enterobacter pneumonia. Patient is on cefepime. Patient still spiking fevers and has gangrene on toes at this point. Off pressors at this point. I will get infectious disease consultation in the a.m. 2. Acute renal failure. This has improved likely due to ATN in the setting of sepsis and rhabdomyolysis. Patient was initially on CRRT and then HD and now off. Creatinine has improved. 3. Atrial fibrillation with rapid ventricular response. Heart rate controlled. 4. Type 2 diabetes- patient on sliding scale insulin 5. Elevated liver function tests and jaundice- likely secondary shock liver 6. Acute rhabdomyolysis- CPKs have trended better 7. 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 Platelet count has recovered. 8. Acute respiratory failure- patient now extubated. Continue nasal cannula and intermittent BiPAP 9. Severe hypernatremia- on D5W. Sodium still high today at 151 10. Acute encephalopathy. Patient now answering questions. Patient passed bedside swallow eval with nursing staff and put on dysphagia diet 11. Gangrene bilateral lower extremities. May end up needing amputations in the future.  Code Status:     Code Status Orders         Start     Ordered   10/30/15 1511  Do not attempt resuscitation (DNR)   Continuous    Question Answer Comment  In the event of cardiac or respiratory ARREST Do not call a "code blue"   In the event of cardiac or respiratory ARREST Do not perform Intubation, CPR, defibrillation or ACLS   In the event of cardiac or respiratory ARREST Use medication by any route, position, wound care, and other measures to relive pain and suffering. May use oxygen, suction and manual treatment of airway obstruction as needed for comfort.   Comments DNR/ DNI      10/30/15 1510    Code Status History    Date Active Date Inactive Code Status Order ID Comments User Context   10/30/2015  2:18 PM 10/30/2015  3:10 PM DNR [id number]  Colleen Can, MD Inpatient   10/20/2015  5:14 PM 10/30/2015  2:18 PM Full Code [id number]  Fritzi Mandes, MD Inpatient     Disposition Plan: To be determined  Consultants:  Nephrology  Neurology  Critical care  Vascular surgery  Antibiotics:  Cefepime  Time spent: 25 minutes.   Loletha Grayer  [facility] [place] [facility]

## 2015-11-01 NOTE — Progress Notes (Signed)
Patient's HOB up at 45 degrees for bedside swallowing.Patient ate 2 spoons of applesauce, ate ice chips and drank a couple sips of water. She maintained her O2 sats at 94%. No coughing after swallowing fluids or applesauce.

## 2015-11-01 NOTE — Progress Notes (Addendum)
[place] NOTE  Pharmacy Consult for Cefepime Dosing, Electrolyte Management, Constipation Prevention Indication: HD/ Proteus Bacteremia     Allergies  Allergen Reactions  . Prednisone Anaphylaxis    Patient Measurements: Height: '5\' 7"'$  (170.2 cm) Weight: 266 lb 12.1 oz (121 kg) IBW/kg (Calculated) : 61.6   Vital Signs: Temp: 99.1 F (37.3 C) (02/05 0600) Temp Source: Core (Comment) (02/05 0400) BP: 157/65 mmHg (02/05 0600) Pulse Rate: 88 (02/05 0600) Intake/Output from previous day: 02/04 0701 - 02/05 0700 In: 3875.7 [I.V.:2775.7; IV Piggyback:1100] Out: 2775 [Urine:2775] Intake/Output from this shift: Total I/O In: 100 [IV Piggyback:100] Out: -  Vent settings for last 24 hours: Vent Mode:  [-]  FiO2 (%):  [28 %] 28 %  Labs:  Recent Labs  10/30/15 0431 10/31/15 0500 10/31/15 0649 10/31/15 1708 10/31/15 2124 11/01/15 0455  WBC 29.4* 28.4*  --   --   --   --   HGB 10.0* 9.9*  --   --   --   --   HCT 30.8* 30.6*  --   --   --   --   PLT 181 211  --   --   --   --   CREATININE 1.33* 1.25*  --  1.27* 1.22* 1.09*  MG 2.1  --  2.0  --   --  2.0  PHOS 3.3  --   --   --   --  3.2   Estimated Creatinine Clearance: 65.7 mL/min (by C-G formula based on Cr of 1.09).   Recent Labs  11/01/15 0628 11/01/15 0731 11/01/15 0836  GLUCAP 132* 139* 182*    Microbiology: Recent Results (from the past 720 hour(s))  Urine culture     Status: None   Collection Time: 10/20/15  9:57 AM  Result Value Ref Range Status   Specimen Description URINE, RANDOM  Final   Special Requests NONE  Final   Culture >=100,000 COLONIES/mL PROTEUS MIRABILIS  Final   Report Status 10/22/2015 FINAL  Final   Organism ID, Bacteria PROTEUS MIRABILIS  Final      Susceptibility   Proteus mirabilis - MIC*    AMPICILLIN <=2 SENSITIVE Sensitive     GENTAMICIN <=1 SENSITIVE Sensitive     CEFTRIAXONE Value in next row Sensitive      SENSITIVE<=1    CIPROFLOXACIN Value in  next row Sensitive      SENSITIVE<=0.25    IMIPENEM Value in next row Sensitive      SENSITIVE2    NITROFURANTOIN Value in next row Resistant      RESISTANT128    TRIMETH/SULFA Value in next row Sensitive      SENSITIVE<=20    PIP/TAZO Value in next row Sensitive      SENSITIVE<=4    AMPICILLIN/SULBACTAM Value in next row Sensitive      SENSITIVE<=2    * >=100,000 COLONIES/mL PROTEUS MIRABILIS  Culture, blood (routine x 2)     Status: None   Collection Time: 10/20/15  9:57 AM  Result Value Ref Range Status   Specimen Description BLOOD RIGHT WRIST  Final   Special Requests   Final    BOTTLES DRAWN AEROBIC AND ANAEROBIC ANA 1ML AER 4ML   Culture  Setup Time   Final    GRAM NEGATIVE RODS IN BOTH AEROBIC AND ANAEROBIC BOTTLES CRITICAL RESULT CALLED TO, READ BACK BY AND VERIFIED WITH: MATT MCBANE ON 10/21/15 AT 0005 [facility] CONFIRMED BY [place]    Culture   Final  PROTEUS MIRABILIS IN BOTH AEROBIC AND ANAEROBIC BOTTLES    Report Status 10/22/2015 FINAL  Final   Organism ID, Bacteria PROTEUS MIRABILIS  Final      Susceptibility   Proteus mirabilis - MIC*    AMPICILLIN/SULBACTAM Value in next row Sensitive      SENSITIVE<=2    PIP/TAZO Value in next row Sensitive      SENSITIVE<=4    CEFTRIAXONE Value in next row Sensitive      SENSITIVE<=1    IMIPENEM Value in next row Sensitive      SENSITIVE2    GENTAMICIN Value in next row Sensitive      SENSITIVE<=1    CIPROFLOXACIN Value in next row Sensitive      SENSITIVE<=0.25    * PROTEUS MIRABILIS  Culture, blood (routine x 2)     Status: None   Collection Time: 10/20/15  9:57 AM  Result Value Ref Range Status   Specimen Description BLOOD LEFT HAND  Final   Special Requests   Final    BOTTLES DRAWN AEROBIC AND ANAEROBIC AER 3ML ANA 2ML   Culture  Setup Time   Final    GRAM NEGATIVE RODS IN BOTH AEROBIC AND ANAEROBIC BOTTLES CRITICAL VALUE NOTED.  VALUE IS CONSISTENT WITH PREVIOUSLY REPORTED AND CALLED VALUE.    Culture   Final     PROTEUS MIRABILIS IN BOTH AEROBIC AND ANAEROBIC BOTTLES    Report Status 10/22/2015 FINAL  Final   Organism ID, Bacteria PROTEUS MIRABILIS  Final      Susceptibility   Proteus mirabilis - MIC*    AMPICILLIN Value in next row Sensitive      SENSITIVE<=2    PIP/TAZO Value in next row Sensitive      SENSITIVE<=4    CEFTAZIDIME Value in next row Sensitive      SENSITIVE<=1    CEFTRIAXONE Value in next row Sensitive      SENSITIVE<=1    CEFEPIME Value in next row Sensitive      SENSITIVE<=1    IMIPENEM Value in next row Sensitive      SENSITIVE2    GENTAMICIN Value in next row Sensitive      SENSITIVE<=1    CIPROFLOXACIN Value in next row Sensitive      SENSITIVE<=0.25    * PROTEUS MIRABILIS  Blood Culture ID Panel (Reflexed)     Status: Abnormal   Collection Time: 10/20/15  9:57 AM  Result Value Ref Range Status   Enterococcus species NOT DETECTED NOT DETECTED Final   Listeria monocytogenes NOT DETECTED NOT DETECTED Final   Staphylococcus species NOT DETECTED NOT DETECTED Final   Staphylococcus aureus NOT DETECTED NOT DETECTED Final   Streptococcus species NOT DETECTED NOT DETECTED Final   Streptococcus agalactiae NOT DETECTED NOT DETECTED Final   Streptococcus pneumoniae NOT DETECTED NOT DETECTED Final   Streptococcus pyogenes NOT DETECTED NOT DETECTED Final   Acinetobacter baumannii NOT DETECTED NOT DETECTED Final   Enterobacteriaceae species DETECTED (A) NOT DETECTED Final    Comment: CRITICAL RESULT CALLED TO, READ BACK BY AND VERIFIED WITH: MATT MCBANE ON 10/21/15 AT 0005 [place]    Enterobacter cloacae complex NOT DETECTED NOT DETECTED Final   Escherichia coli NOT DETECTED NOT DETECTED Final   Klebsiella oxytoca NOT DETECTED NOT DETECTED Final   Klebsiella pneumoniae NOT DETECTED NOT DETECTED Final   Proteus species (A) NOT DETECTED Final    CRITICAL RESULT CALLED TO, READ BACK BY AND VERIFIED WITH:    Comment: MATT MCBANE ON  10/21/15 AT 0005 [place]   Serratia marcescens NOT  DETECTED NOT DETECTED Final   Haemophilus influenzae NOT DETECTED NOT DETECTED Final   Neisseria meningitidis NOT DETECTED NOT DETECTED Final   Pseudomonas aeruginosa NOT DETECTED NOT DETECTED Final   Candida albicans NOT DETECTED NOT DETECTED Final   Candida glabrata NOT DETECTED NOT DETECTED Final   Candida krusei NOT DETECTED NOT DETECTED Final   Candida parapsilosis NOT DETECTED NOT DETECTED Final   Candida tropicalis NOT DETECTED NOT DETECTED Final   Carbapenem resistance NOT DETECTED NOT DETECTED Final   Methicillin resistance NOT DETECTED NOT DETECTED Final   Vancomycin resistance NOT DETECTED NOT DETECTED Final  MRSA PCR Screening     Status: None   Collection Time: 10/20/15  7:41 PM  Result Value Ref Range Status   MRSA by PCR NEGATIVE NEGATIVE Final    Comment:        The GeneXpert MRSA Assay (FDA approved for NASAL specimens only), is one component of a comprehensive MRSA colonization surveillance program. It is not intended to diagnose MRSA infection nor to guide or monitor treatment for MRSA infections.   CULTURE, BLOOD (ROUTINE X 2) w Reflex to PCR ID Panel     Status: None   Collection Time: 10/22/15 11:39 AM  Result Value Ref Range Status   Specimen Description BLOOD LEFT HAND  Final   Special Requests BOTTLES DRAWN AEROBIC AND ANAEROBIC 1CC  Final   Culture NO GROWTH 5 DAYS  Final   Report Status 10/27/2015 FINAL  Final  Culture, expectorated sputum-assessment     Status: None   Collection Time: 10/22/15 12:00 PM  Result Value Ref Range Status   Specimen Description SPUTUM  Final   Special Requests Normal  Final   Sputum evaluation THIS SPECIMEN IS ACCEPTABLE FOR SPUTUM CULTURE  Final   Report Status 10/22/2015 FINAL  Final  Culture, respiratory (NON-Expectorated)     Status: None   Collection Time: 10/22/15 12:00 PM  Result Value Ref Range Status   Specimen Description SPUTUM  Final   Special Requests Normal Reflexed from [id number]  Final   Gram Stain    Final    FAIR SPECIMEN - 70-80% WBCS FEW WBC SEEN RARE YEAST RARE GRAM NEGATIVE RODS    Culture LIGHT GROWTH ENTEROBACTER AEROGENES  Final   Report Status 10/24/2015 FINAL  Final   Organism ID, Bacteria ENTEROBACTER AEROGENES  Final      Susceptibility   Enterobacter aerogenes - MIC*    CEFAZOLIN >=64 RESISTANT Resistant     CEFEPIME <=1 SENSITIVE Sensitive     CEFTAZIDIME <=1 SENSITIVE Sensitive     CEFTRIAXONE <=1 SENSITIVE Sensitive     CIPROFLOXACIN <=0.25 SENSITIVE Sensitive     GENTAMICIN <=1 SENSITIVE Sensitive     IMIPENEM 2 SENSITIVE Sensitive     TRIMETH/SULFA <=20 SENSITIVE Sensitive     PIP/TAZO <=4 SENSITIVE Sensitive     * LIGHT GROWTH ENTEROBACTER AEROGENES  Urine culture     Status: None   Collection Time: 10/22/15 12:45 PM  Result Value Ref Range Status   Specimen Description URINE, RANDOM  Final   Special Requests NONE  Final   Culture NO GROWTH 2 DAYS  Final   Report Status 10/24/2015 FINAL  Final  Urine culture     Status: None   Collection Time: 10/28/15  2:36 PM  Result Value Ref Range Status   Specimen Description URINE, RANDOM  Final   Special Requests NONE  Final   Culture  NO GROWTH 2 DAYS  Final   Report Status 10/30/2015 FINAL  Final    Medications:  Scheduled:  . ceFEPime (MAXIPIME) IV  2 g Intravenous Q12H  . chlorhexidine gluconate  15 mL Mouth Rinse BID  . free water  200 mL Per Tube Q4H  . ipratropium-albuterol  3 mL Nebulization Q6H  . pantoprazole (PROTONIX) IV  40 mg Intravenous Q24H  . potassium chloride  10 mEq Intravenous Q1 Hr x 4   Infusions:  . dextrose 125 mL/hr at 11/01/15 0600  . insulin (NOVOLIN-R) infusion 1.2 Units/hr (11/01/15 0837)  . norepinephrine (LEVOPHED) Adult infusion Stopped (10/25/15 2259)  . vasopressin (PITRESSIN) infusion - *FOR SHOCK* Stopped (10/23/15 1157)    Assessment: 70 y/o F with septic shock from Proteus UTI. Patient with ARF recently transitioned from CRRT to HD not currently requiring HD.     Plan:  1. Will continue cefepime dosing  2 g iv q 12 hours for renal function.   2. Electrolytes: All within normals limits except K= 3.4. Will give KCl 10 mEq q1h x 3 doses and will recheck with am labs.   3. Constipation: Patient with last BM 2/3. Will continue senna/docusate to 2 tab BID.    Pharmacy will continue to monitor and adjust per consult.    Larene Beach, PharmD  Clinical Pharmacist   11/01/2015

## 2015-11-01 NOTE — Progress Notes (Signed)
[facility] Progess Note    ASSESSMENT/PLAN  70 year old female with no significant past medical history, found unresponsive at home, intubated and subsequently Severe septic shock due to UTI Proteus, required CRRT and intermittent HD, extubated 2/2, now on requiring intermittent BiPAP and continue nasal cannula, mentation not back to baseline yet.  PULMONARY OETT 7.34m Respiratory failure - hypoxic Septic shock -slowly improving Reviewed CT and chest x-ray images reports, appears to be his suggestive of bibasilar atelectasis with small bibasilar effusions, possibility of pneumonia, particularly in light of continued fevers.  P:  - mild inc work of breathing this AM, ABG with mild resp/met alk, cxr reviewd - small b/l effusion, no significant inflitrates/intersitital edema -Continue with diuresis as tolerated  DVT prophylaxis with Lovenox.  INFECTIOUS Sepsis - Proteus UTI and Bacteremia No fevers overnight.  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 Urine culture 10/22/2015 negative thus far. Blood culture 10/22/2015: Negative. Urine culture 10/20/2015: Positive for Proteus mirabilis..Marland KitchenMRSA screen negative  CARDIOVASCULAR CVL - RIJ VASCATH, LIJ CVL Shock - septic, hypotension, improved, patient has been weaned off of pressors. NSTEMI Afib - started on amiodarone 1/24  P:  - maintain MAP >65 - vasopressors as needed - currently weaned off.  - monitor BP - trend CE - heparin gtt on hold due to low plt - now normalized - mostly likely demand ischemia - EKG reviewed, no sig ST changes.  - cont with amiodarone -  cardiology consult appreciated.   RENAL  Status post ARF-kidney function continues to improve, with decreasing creatinine and good urine output. UTI Hypernatremia-improving Right hydronephrosis Proteus Bacteremia, Proteus UTI, CT abdomen reviewed-no evidence of abdominal abscess. P:  -monitor Cr, urine output and creatinine improving -workup of hydronephrosis can be done as an outpatient with urology -currently on D5W for elevated Na, has large free H2O deficit, increase free H2O to 125cc/hr, now with improvement.   GASTROINTESTINAL - extubated on 2/2, mentation is still not back to baseline, still with confusion and mental slowness, may need to place NGT for tube feeds if no improvement over the next 24 hours.  Tolerated some apple sauce this morning.  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  - cont to monitor neuro status.  - neurology following, recommend MRI - still with confusion, inc wob and unable to lay flat, may reconsider MRI Brain in the AM.   Social -Patient followed by palliative care, currently patient is DO NOT RESUSCITATE. However, this order was obtained via a distant uncle, who may have dementia and may not be appropriate to make medical decisions for the patient. This has not been verified. I discussed this with palliative care physician who is currently handling CODE STATUS, palliative care physician stated they would look further into appropriate power of attorney.  ---------------------------------------   ----------------------------------------   Name: April GITTOMRN: [id number]: [birth year]    ADMISSION  DATE:  10/20/2015    CHIEF COMPLAINT:  dyspnea  SUBJECTIVE:   Patient currently extubated, is following simple commands, however there is mental slowness, associated with altered mental status. Of note, RN stated that via her coworkers the patient's power of attorney (distant uncle) may have dementia and may not be the appropriate person to make medical decisions for the patient.   VITAL SIGNS: Temp:  [99 F (37.2 C)-99.9 F (37.7 C)] 99.7 F (37.6 C) (02/05 1200) Pulse Rate:  [86-95] 95 (02/05 1200) Resp:  [23-35] 31 (02/05 1200) BP: (133-172)/(48-90) 144/63 mmHg (02/05 1200) SpO2:  [89 %-97 %] 93 % (02/05 1200) FiO2 (%):  [28 %] 28 % (02/05 0847) Weight:  [266 lb 12.1 oz (121 kg)] 266 lb 12.1 oz (121 kg) (02/05 0500) HEMODYNAMICS:   VENTILATOR SETTINGS: Vent Mode:  [-]  FiO2 (%):  [28 %] 28 % INTAKE / OUTPUT:  Intake/Output Summary (Last 24 hours) at 11/01/15 1412 Last data filed at 11/01/15 1200  Gross per 24 hour  Intake 3707.87 ml  Output   2325 ml  Net 1382.87 ml    PHYSICAL EXAMINATION: Physical Examination:   VS: BP 144/63 mmHg  Pulse 95  Temp(Src) 99.7 F (37.6 C) (Core (Comment))  Resp 31  Ht 5' 7" (1.702 m)  Wt 266 lb 12.1 oz (121 kg)  BMI 41.77 kg/m2  SpO2 93%  General Appearance: No distress  Neuro:without focal findings,  HEENT: PERRLA, EOM intact. Pulmonary: normal breath sounds   CardiovascularNormal S1,S2.  No m/r/g.   Abdomen: Benign, Soft, non-tender. Renal:  No costovertebral tenderness  GU:  Not performed at this time. Endocrine: No evident thyromegaly. Skin:   warm, no rashes, no ecchymosis  Extremities: normal, no cyanosis, clubbing.   LABS:   LABORATORY PANEL:   CBC  Recent Labs Lab 10/31/15 0500  WBC 28.4*  HGB 9.9*  HCT 30.6*  PLT 211    Chemistries   Recent Labs Lab 10/26/15 0400  11/01/15 0455  NA 145  < > 156*  K 3.2*  < > 3.3*  CL 110  < > 123*  CO2 29  < > 30  GLUCOSE 232*  < > 144*   BUN 89*  < > 39*  CREATININE 2.11*  < > 1.09*  CALCIUM 7.5*  < > 7.6*  MG 2.3  < > 2.0  PHOS 4.5  < > 3.2  AST 92*  --   --   ALT 174*  --   --   ALKPHOS 188*  --   --   BILITOT 2.1*  --   --   < > = values in this interval not displayed.   Recent Labs Lab 11/01/15 0836 11/01/15 0934 11/01/15 1022 11/01/15 1123 11/01/15 1236 11/01/15 1340  GLUCAP 182* 157* 168* 188* 171* 181*    Recent Labs Lab 10/29/15 1232 10/30/15 0438 11/01/15 1139  PHART 7.34* 7.52* 7.47*  PCO2ART 58* 40 39  PO2ART 70* 57* 63*    Recent Labs Lab 10/26/15 0400  AST 92*  ALT 174*  ALKPHOS 188*  BILITOT 2.1*  ALBUMIN 1.7*    Cardiac Enzymes No results for input(s): TROPONINI in the last 168 hours.  RADIOLOGY:  Dg Chest 1 View  11/01/2015  CLINICAL DATA:  70 year old female with acute shortness of Breath, found unresponsive at the end of January. Sepsis. Initial encounter. EXAM: CHEST 1 VIEW COMPARISON:  10/31/2015 and earlier. FINDINGS: Portable AP semi upright view at 1204 hours. Large body habitus. Bilateral  IJ central lines appear stable. No endotracheal tube or enteric tube. Since 10/20/2015 bilateral small pleural effusions have developed but are stable to mildly improved since yesterday, and improved since 10/30/2015. No pneumothorax. Stable pulmonary vascularity without overt edema. Stable cardiac size and mediastinal contours. No air bronchograms identified to suggest consolidation. IMPRESSION: Bilateral pleural effusions appear small, and somewhat improved since 10/30/2015. Electronically Signed   By: Genevie Ann M.D.   On: 11/01/2015 13:48   Dg Chest 1 View  10/31/2015  CLINICAL DATA:  Dyspnea. EXAM: CHEST 1 VIEW COMPARISON:  10/30/2015 FINDINGS: Right jugular central venous catheter terminates over the lower SVC/ cavoatrial junction, unchanged. Left jugular catheter terminates over the mid SVC. Cardiac silhouette is partially obscured but grossly unchanged. Thoracic aortic calcification is  noted. Lung volumes remain diminished with pulmonary vascular congestion the and mild interstitial edema. Bibasilar opacities are unchanged and likely reflect a combination of atelectasis and small pleural effusions. Old left rib fractures. IMPRESSION: Pulmonary edema, bibasilar atelectasis, and small bilateral pleural effusions. No significant interval change. Electronically Signed   By: Logan Bores M.D.   On: 10/31/2015 07:22     I have personally obtained a history, examined the patient, evaluated laboratory and imaging results, formulated the assessment and plan and placed orders. CRITICAL CARE: The patient is critically ill with multiple organ systems failure and requires high complexity decision making for assessment and support, frequent evaluation and titration of therapies, application of advanced monitoring technologies and extensive interpretation of multiple databases. Critical Care Time devoted to patient care services described in this note is 35 minutes.    Vilinda Boehringer, MD [facility] Pager [phone] (please enter 7-digits) On Call Pager - [phone] (please enter 7-digits)

## 2015-11-01 NOTE — Progress Notes (Signed)
[facility]  ROUNDING NOTE   Subjective:   Has not required hemodialysis in over a week. Dialysis catheter to be removed Now, UOP > 2700 cc Extubated thursday , required BiPAP on Friday, currently on [place] Alert, Able to follow simple commands Serum creatinine has further improved however, sodium level has improved with D5W supplementation but still above normal range  Objective:  Vital signs in last 24 hours:  Temp:  [99 F (37.2 C)-99.9 F (37.7 C)] 99.7 F (37.6 C) (02/05 1200) Pulse Rate:  [86-95] 95 (02/05 1200) Resp:  [23-35] 31 (02/05 1200) BP: (133-172)/(48-90) 144/63 mmHg (02/05 1200) SpO2:  [89 %-97 %] 93 % (02/05 1200) FiO2 (%):  [28 %] 28 % (02/05 0847) Weight:  [121 kg (266 lb 12.1 oz)] 121 kg (266 lb 12.1 oz) (02/05 0500)  Weight change: -1.6 kg (-3 lb 8.4 oz) Filed Weights   10/30/15 0500 10/31/15 0500 11/01/15 0500  Weight: 129.5 kg (285 lb 7.9 oz) 122.6 kg (270 lb 4.5 oz) 121 kg (266 lb 12.1 oz)    Intake/Output: I/O last 3 completed shifts: In: 4994.9 [I.V.:3844.9; IV Piggyback:1150] Out: 4970 [Urine:4030]   Intake/Output this shift:  Total I/O In: 981.3 [I.V.:631.3; IV Piggyback:350] Out: -   Physical Exam: General: Critically ill appearing   Head: normocephalic  Eyes: Eyes open   Neck: No masses  Lungs:   [place] oxygen, tachypneic, coarse BS b/l  Heart: Regular, no rub   Abdomen:  Soft, nontender, obese  Extremities: 3+ peripheral edema. +anasarca  Neurologic:  alert, able to follow simple commands   Skin: Feet in soft support, necrosis in toes  Access: RIJ vascath 10/20/15 Dr. Stevenson Clinch    Basic Metabolic Panel:  Recent Labs Lab 10/26/15 0400  10/28/15 0443  10/30/15 0431 10/31/15 0500 10/31/15 2637 10/31/15 1337 10/31/15 1708 10/31/15 2124 11/01/15 0455  NA 145  < > 152*  < > 157* 161*  --   --  155* 157* 156*  K 3.2*  < > 3.9  < > 3.7 3.2*  --  3.4* 3.2* 3.4* 3.3*  CL 110  < > 119*  < > 121* 125*  --   --  120* 122* 123*   CO2 29  < > 29  < > 31 30  --   --  '29 31 30  '$ GLUCOSE 232*  < > 209*  < > 171* 205*  --   --  279* 247* 144*  BUN 89*  < > 71*  < > 54* 49*  --   --  42* 40* 39*  CREATININE 2.11*  < > 1.60*  < > 1.33* 1.25*  --   --  1.27* 1.22* 1.09*  CALCIUM 7.5*  < > 7.7*  < > 8.1* 8.0*  --   --  7.8* 7.8* 7.6*  MG 2.3  --  2.3  --  2.1  --  2.0  --   --   --  2.0  PHOS 4.5  --   --   --  3.3  --   --   --   --   --  3.2  < > = values in this interval not displayed.  Liver Function Tests:  Recent Labs Lab 10/26/15 0400  AST 92*  ALT 174*  ALKPHOS 188*  BILITOT 2.1*  PROT 5.3*  ALBUMIN 1.7*   No results for input(s): LIPASE, AMYLASE in the last 168 hours.  Recent Labs Lab 10/31/15 0649  AMMONIA 18  CBC:  Recent Labs Lab 10/27/15 0433 10/28/15 0443 10/29/15 0421 10/30/15 0431 10/31/15 0500  WBC 27.6* 26.2* 31.0* 29.4* 28.4*  HGB 10.0* 9.7* 9.4* 10.0* 9.9*  HCT 30.4* 29.9* 29.0* 30.8* 30.6*  MCV 91.0 93.0 92.1 94.1 93.7  PLT 82* 109* 132* 181 211    Cardiac Enzymes: No results for input(s): CKTOTAL, CKMB, CKMBINDEX, TROPONINI in the last 168 hours.  BNP: Invalid input(s): POCBNP  CBG:  Recent Labs Lab 11/01/15 0934 11/01/15 1022 11/01/15 1123 11/01/15 1236 11/01/15 1340  GLUCAP 157* 168* 188* 171* 181*    Microbiology: Results for orders placed or performed during the hospital encounter of 10/20/15  Urine culture     Status: None   Collection Time: 10/20/15  9:57 AM  Result Value Ref Range Status   Specimen Description URINE, RANDOM  Final   Special Requests NONE  Final   Culture >=100,000 COLONIES/mL PROTEUS MIRABILIS  Final   Report Status 10/22/2015 FINAL  Final   Organism ID, Bacteria PROTEUS MIRABILIS  Final      Susceptibility   Proteus mirabilis - MIC*    AMPICILLIN <=2 SENSITIVE Sensitive     GENTAMICIN <=1 SENSITIVE Sensitive     CEFTRIAXONE Value in next row Sensitive      SENSITIVE<=1    CIPROFLOXACIN Value in next row Sensitive       SENSITIVE<=0.25    IMIPENEM Value in next row Sensitive      SENSITIVE2    NITROFURANTOIN Value in next row Resistant      RESISTANT128    TRIMETH/SULFA Value in next row Sensitive      SENSITIVE<=20    PIP/TAZO Value in next row Sensitive      SENSITIVE<=4    AMPICILLIN/SULBACTAM Value in next row Sensitive      SENSITIVE<=2    * >=100,000 COLONIES/mL PROTEUS MIRABILIS  Culture, blood (routine x 2)     Status: None   Collection Time: 10/20/15  9:57 AM  Result Value Ref Range Status   Specimen Description BLOOD RIGHT WRIST  Final   Special Requests   Final    BOTTLES DRAWN AEROBIC AND ANAEROBIC ANA 1ML AER 4ML   Culture  Setup Time   Final    GRAM NEGATIVE RODS IN BOTH AEROBIC AND ANAEROBIC BOTTLES CRITICAL RESULT CALLED TO, READ BACK BY AND VERIFIED WITH: MATT MCBANE ON 10/21/15 AT 0005 [facility] CONFIRMED BY [place]    Culture   Final    PROTEUS MIRABILIS IN BOTH AEROBIC AND ANAEROBIC BOTTLES    Report Status 10/22/2015 FINAL  Final   Organism ID, Bacteria PROTEUS MIRABILIS  Final      Susceptibility   Proteus mirabilis - MIC*    AMPICILLIN/SULBACTAM Value in next row Sensitive      SENSITIVE<=2    PIP/TAZO Value in next row Sensitive      SENSITIVE<=4    CEFTRIAXONE Value in next row Sensitive      SENSITIVE<=1    IMIPENEM Value in next row Sensitive      SENSITIVE2    GENTAMICIN Value in next row Sensitive      SENSITIVE<=1    CIPROFLOXACIN Value in next row Sensitive      SENSITIVE<=0.25    * PROTEUS MIRABILIS  Culture, blood (routine x 2)     Status: None   Collection Time: 10/20/15  9:57 AM  Result Value Ref Range Status   Specimen Description BLOOD LEFT HAND  Final   Special Requests   Final  BOTTLES DRAWN AEROBIC AND ANAEROBIC AER 3ML ANA 2ML   Culture  Setup Time   Final    GRAM NEGATIVE RODS IN BOTH AEROBIC AND ANAEROBIC BOTTLES CRITICAL VALUE NOTED.  VALUE IS CONSISTENT WITH PREVIOUSLY REPORTED AND CALLED VALUE.    Culture   Final    PROTEUS MIRABILIS IN  BOTH AEROBIC AND ANAEROBIC BOTTLES    Report Status 10/22/2015 FINAL  Final   Organism ID, Bacteria PROTEUS MIRABILIS  Final      Susceptibility   Proteus mirabilis - MIC*    AMPICILLIN Value in next row Sensitive      SENSITIVE<=2    PIP/TAZO Value in next row Sensitive      SENSITIVE<=4    CEFTAZIDIME Value in next row Sensitive      SENSITIVE<=1    CEFTRIAXONE Value in next row Sensitive      SENSITIVE<=1    CEFEPIME Value in next row Sensitive      SENSITIVE<=1    IMIPENEM Value in next row Sensitive      SENSITIVE2    GENTAMICIN Value in next row Sensitive      SENSITIVE<=1    CIPROFLOXACIN Value in next row Sensitive      SENSITIVE<=0.25    * PROTEUS MIRABILIS  Blood Culture ID Panel (Reflexed)     Status: Abnormal   Collection Time: 10/20/15  9:57 AM  Result Value Ref Range Status   Enterococcus species NOT DETECTED NOT DETECTED Final   Listeria monocytogenes NOT DETECTED NOT DETECTED Final   Staphylococcus species NOT DETECTED NOT DETECTED Final   Staphylococcus aureus NOT DETECTED NOT DETECTED Final   Streptococcus species NOT DETECTED NOT DETECTED Final   Streptococcus agalactiae NOT DETECTED NOT DETECTED Final   Streptococcus pneumoniae NOT DETECTED NOT DETECTED Final   Streptococcus pyogenes NOT DETECTED NOT DETECTED Final   Acinetobacter baumannii NOT DETECTED NOT DETECTED Final   Enterobacteriaceae species DETECTED (A) NOT DETECTED Final    Comment: CRITICAL RESULT CALLED TO, READ BACK BY AND VERIFIED WITH: MATT MCBANE ON 10/21/15 AT 0005 [place]    Enterobacter cloacae complex NOT DETECTED NOT DETECTED Final   Escherichia coli NOT DETECTED NOT DETECTED Final   Klebsiella oxytoca NOT DETECTED NOT DETECTED Final   Klebsiella pneumoniae NOT DETECTED NOT DETECTED Final   Proteus species (A) NOT DETECTED Final    CRITICAL RESULT CALLED TO, READ BACK BY AND VERIFIED WITH:    Comment: MATT MCBANE ON 10/21/15 AT 0005 [place]   Serratia marcescens NOT DETECTED NOT DETECTED  Final   Haemophilus influenzae NOT DETECTED NOT DETECTED Final   Neisseria meningitidis NOT DETECTED NOT DETECTED Final   Pseudomonas aeruginosa NOT DETECTED NOT DETECTED Final   Candida albicans NOT DETECTED NOT DETECTED Final   Candida glabrata NOT DETECTED NOT DETECTED Final   Candida krusei NOT DETECTED NOT DETECTED Final   Candida parapsilosis NOT DETECTED NOT DETECTED Final   Candida tropicalis NOT DETECTED NOT DETECTED Final   Carbapenem resistance NOT DETECTED NOT DETECTED Final   Methicillin resistance NOT DETECTED NOT DETECTED Final   Vancomycin resistance NOT DETECTED NOT DETECTED Final  MRSA PCR Screening     Status: None   Collection Time: 10/20/15  7:41 PM  Result Value Ref Range Status   MRSA by PCR NEGATIVE NEGATIVE Final    Comment:        The GeneXpert MRSA Assay (FDA approved for NASAL specimens only), is one component of a comprehensive MRSA colonization surveillance program. It is not  intended to diagnose MRSA infection nor to guide or monitor treatment for MRSA infections.   CULTURE, BLOOD (ROUTINE X 2) w Reflex to PCR ID Panel     Status: None   Collection Time: 10/22/15 11:39 AM  Result Value Ref Range Status   Specimen Description BLOOD LEFT HAND  Final   Special Requests BOTTLES DRAWN AEROBIC AND ANAEROBIC 1CC  Final   Culture NO GROWTH 5 DAYS  Final   Report Status 10/27/2015 FINAL  Final  Culture, expectorated sputum-assessment     Status: None   Collection Time: 10/22/15 12:00 PM  Result Value Ref Range Status   Specimen Description SPUTUM  Final   Special Requests Normal  Final   Sputum evaluation THIS SPECIMEN IS ACCEPTABLE FOR SPUTUM CULTURE  Final   Report Status 10/22/2015 FINAL  Final  Culture, respiratory (NON-Expectorated)     Status: None   Collection Time: 10/22/15 12:00 PM  Result Value Ref Range Status   Specimen Description SPUTUM  Final   Special Requests Normal Reflexed from [id number]  Final   Gram Stain   Final    FAIR SPECIMEN  - 70-80% WBCS FEW WBC SEEN RARE YEAST RARE GRAM NEGATIVE RODS    Culture LIGHT GROWTH ENTEROBACTER AEROGENES  Final   Report Status 10/24/2015 FINAL  Final   Organism ID, Bacteria ENTEROBACTER AEROGENES  Final      Susceptibility   Enterobacter aerogenes - MIC*    CEFAZOLIN >=64 RESISTANT Resistant     CEFEPIME <=1 SENSITIVE Sensitive     CEFTAZIDIME <=1 SENSITIVE Sensitive     CEFTRIAXONE <=1 SENSITIVE Sensitive     CIPROFLOXACIN <=0.25 SENSITIVE Sensitive     GENTAMICIN <=1 SENSITIVE Sensitive     IMIPENEM 2 SENSITIVE Sensitive     TRIMETH/SULFA <=20 SENSITIVE Sensitive     PIP/TAZO <=4 SENSITIVE Sensitive     * LIGHT GROWTH ENTEROBACTER AEROGENES  Urine culture     Status: None   Collection Time: 10/22/15 12:45 PM  Result Value Ref Range Status   Specimen Description URINE, RANDOM  Final   Special Requests NONE  Final   Culture NO GROWTH 2 DAYS  Final   Report Status 10/24/2015 FINAL  Final  Urine culture     Status: None   Collection Time: 10/28/15  2:36 PM  Result Value Ref Range Status   Specimen Description URINE, RANDOM  Final   Special Requests NONE  Final   Culture NO GROWTH 2 DAYS  Final   Report Status 10/30/2015 FINAL  Final    Coagulation Studies: No results for input(s): LABPROT, INR in the last 72 hours.  Urinalysis: No results for input(s): COLORURINE, LABSPEC, PHURINE, GLUCOSEU, HGBUR, BILIRUBINUR, KETONESUR, PROTEINUR, UROBILINOGEN, NITRITE, LEUKOCYTESUR in the last 72 hours.  Invalid input(s): APPERANCEUR    Imaging: Dg Chest 1 View  11/01/2015  CLINICAL DATA:  70 year old female with acute shortness of Breath, found unresponsive at the end of January. Sepsis. Initial encounter. EXAM: CHEST 1 VIEW COMPARISON:  10/31/2015 and earlier. FINDINGS: Portable AP semi upright view at 1204 hours. Large body habitus. Bilateral IJ central lines appear stable. No endotracheal tube or enteric tube. Since 10/20/2015 bilateral small pleural effusions have developed  but are stable to mildly improved since yesterday, and improved since 10/30/2015. No pneumothorax. Stable pulmonary vascularity without overt edema. Stable cardiac size and mediastinal contours. No air bronchograms identified to suggest consolidation. IMPRESSION: Bilateral pleural effusions appear small, and somewhat improved since 10/30/2015. Electronically Signed   By: Lemmie Evens  [place] Crane M.D.   On: 11/01/2015 13:48   Dg Chest 1 View  10/31/2015  CLINICAL DATA:  Dyspnea. EXAM: CHEST 1 VIEW COMPARISON:  10/30/2015 FINDINGS: Right jugular central venous catheter terminates over the lower SVC/ cavoatrial junction, unchanged. Left jugular catheter terminates over the mid SVC. Cardiac silhouette is partially obscured but grossly unchanged. Thoracic aortic calcification is noted. Lung volumes remain diminished with pulmonary vascular congestion the and mild interstitial edema. Bibasilar opacities are unchanged and likely reflect a combination of atelectasis and small pleural effusions. Old left rib fractures. IMPRESSION: Pulmonary edema, bibasilar atelectasis, and small bilateral pleural effusions. No significant interval change. Electronically Signed   By: Logan Bores M.D.   On: 10/31/2015 07:22     Medications:   . dextrose 125 mL/hr at 11/01/15 0700  . insulin (NOVOLIN-R) infusion 3.6 Units/hr (11/01/15 1342)  . norepinephrine (LEVOPHED) Adult infusion Stopped (10/25/15 2259)  . vasopressin (PITRESSIN) infusion - *FOR SHOCK* Stopped (10/23/15 0950)   . ceFEPime (MAXIPIME) IV  2 g Intravenous Q12H  . chlorhexidine gluconate  15 mL Mouth Rinse BID  . free water  200 mL Per Tube Q4H  . ipratropium-albuterol  3 mL Nebulization Q6H  . pantoprazole (PROTONIX) IV  40 mg Intravenous Q24H   [DISCONTINUED] acetaminophen **OR** acetaminophen, bisacodyl, fentaNYL (SUBLIMAZE) injection, iohexol, labetalol, LORazepam, morphine injection, ondansetron (ZOFRAN) IV, prochlorperazine, sodium chloride flush  Assessment/ Plan:   [name] is a 71 y.o. white female with DM (A1c 7.7%),  who was admitted to [facility] on 10/20/2015  1. Acute renal failure with metabolic acidosis:  - Unknown baseline creatinine. Admit Cr 3.01 Required CRRT from 1/24 to 1/27. Then intermittent hemodialysis 1/28.  - Acute renal failure from sepsis leading to ATN. Serologic testing negative 10/21/15 - Now nonoliguric with post-ATN diuresis. Urine output good   2. Hypernatremia - from post ATN diuresis - increase free water replacement  - continue iv D5W although have to be careful with tenuous resp status  3. Hypokalemia: post ATN diuresis.  - potassium being replaced. prn  4. Sepsis/hypotension secondary to urinary tract infection: proteus - Broad spectrum ABx as per IM team  5. Anasarca - Good diuresis    6. 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 Acute resp failure - Extubated February 2, currently on [place]  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    LOS: 12 April [name] 2/5/20172:00 PM

## 2015-11-02 ENCOUNTER — Inpatient Hospital Stay: Payer: Medicare HMO

## 2015-11-02 DIAGNOSIS — N1330 Unspecified hydronephrosis: Secondary | ICD-10-CM

## 2015-11-02 LAB — CBC
HEMATOCRIT: 29.1 % — AB (ref 35.0–47.0)
Hemoglobin: 9.3 g/dL — ABNORMAL LOW (ref 12.0–16.0)
MCH: 30.2 pg (ref 26.0–34.0)
MCHC: 32.1 g/dL (ref 32.0–36.0)
MCV: 94.1 fL (ref 80.0–100.0)
PLATELETS: 203 10*3/uL (ref 150–440)
RBC: 3.09 MIL/uL — AB (ref 3.80–5.20)
RDW: 14.5 % (ref 11.5–14.5)
WBC: 33.5 10*3/uL — AB (ref 3.6–11.0)

## 2015-11-02 LAB — GLUCOSE, CAPILLARY
GLUCOSE-CAPILLARY: 150 mg/dL — AB (ref 65–99)
GLUCOSE-CAPILLARY: 149 mg/dL — AB (ref 65–99)
GLUCOSE-CAPILLARY: 139 mg/dL — AB (ref 65–99)
GLUCOSE-CAPILLARY: 145 mg/dL — AB (ref 65–99)
GLUCOSE-CAPILLARY: 143 mg/dL — AB (ref 65–99)
GLUCOSE-CAPILLARY: 135 mg/dL — AB (ref 65–99)
Glucose-Capillary: 138 mg/dL — ABNORMAL HIGH (ref 65–99)
Glucose-Capillary: 153 mg/dL — ABNORMAL HIGH (ref 65–99)
Glucose-Capillary: 162 mg/dL — ABNORMAL HIGH (ref 65–99)
Glucose-Capillary: 169 mg/dL — ABNORMAL HIGH (ref 65–99)
Glucose-Capillary: 182 mg/dL — ABNORMAL HIGH (ref 65–99)
Glucose-Capillary: 185 mg/dL — ABNORMAL HIGH (ref 65–99)
Glucose-Capillary: 185 mg/dL — ABNORMAL HIGH (ref 65–99)
Glucose-Capillary: 191 mg/dL — ABNORMAL HIGH (ref 65–99)
Glucose-Capillary: 209 mg/dL — ABNORMAL HIGH (ref 65–99)

## 2015-11-02 LAB — COMPREHENSIVE METABOLIC PANEL
ALT: 29 U/L (ref 14–54)
AST: 20 U/L (ref 15–41)
Albumin: 1.7 g/dL — ABNORMAL LOW (ref 3.5–5.0)
Alkaline Phosphatase: 80 U/L (ref 38–126)
Anion gap: 6 (ref 5–15)
BUN: 30 mg/dL — ABNORMAL HIGH (ref 6–20)
CHLORIDE: 119 mmol/L — AB (ref 101–111)
CO2: 25 mmol/L (ref 22–32)
CREATININE: 0.98 mg/dL (ref 0.44–1.00)
Calcium: 7.7 mg/dL — ABNORMAL LOW (ref 8.9–10.3)
GFR, EST NON AFRICAN AMERICAN: 58 mL/min — AB (ref >60)
Glucose, Bld: 184 mg/dL — ABNORMAL HIGH (ref 65–99)
POTASSIUM: 3.5 mmol/L (ref 3.5–5.1)
Sodium: 150 mmol/L — ABNORMAL HIGH (ref 135–145)
Total Bilirubin: 1 mg/dL (ref 0.3–1.2)
Total Protein: 5.8 g/dL — ABNORMAL LOW (ref 6.5–8.1)

## 2015-11-02 MED ORDER — INSULIN ASPART 100 UNIT/ML ~~LOC~~ SOLN
0.0000 [IU] | Freq: Three times a day (TID) | SUBCUTANEOUS | Status: DC
  Administered 2015-11-02: 2 [IU] via SUBCUTANEOUS
  Administered 2015-11-03 (×2): 3 [IU] via SUBCUTANEOUS
  Administered 2015-11-03: 2 [IU] via SUBCUTANEOUS
  Administered 2015-11-04: 0 [IU] via SUBCUTANEOUS
  Administered 2015-11-04: 2 [IU] via SUBCUTANEOUS
  Administered 2015-11-04: 1 [IU] via SUBCUTANEOUS
  Administered 2015-11-05: 3 [IU] via SUBCUTANEOUS
  Administered 2015-11-05: 2 [IU] via SUBCUTANEOUS
  Administered 2015-11-06: 1 [IU] via SUBCUTANEOUS
  Administered 2015-11-06 (×2): 2 [IU] via SUBCUTANEOUS
  Administered 2015-11-07: 1 [IU] via SUBCUTANEOUS
  Administered 2015-11-07: 3 [IU] via SUBCUTANEOUS
  Administered 2015-11-08: 0 [IU] via SUBCUTANEOUS
  Administered 2015-11-08 – 2015-11-09 (×3): 1 [IU] via SUBCUTANEOUS
  Filled 2015-11-02 (×2): qty 1
  Filled 2015-11-02 (×2): qty 3
  Filled 2015-11-02: qty 2
  Filled 2015-11-02: qty 3
  Filled 2015-11-02: qty 2
  Filled 2015-11-02 (×2): qty 1
  Filled 2015-11-02 (×2): qty 2
  Filled 2015-11-02: qty 3
  Filled 2015-11-02 (×2): qty 2
  Filled 2015-11-02: qty 1

## 2015-11-02 MED ORDER — INSULIN GLARGINE 100 UNIT/ML ~~LOC~~ SOLN
15.0000 [IU] | Freq: Every day | SUBCUTANEOUS | Status: DC
  Administered 2015-11-02 – 2015-11-09 (×8): 15 [IU] via SUBCUTANEOUS
  Filled 2015-11-02 (×8): qty 0.15

## 2015-11-02 MED ORDER — DEXTROSE 5 % IV SOLN
2.0000 g | Freq: Three times a day (TID) | INTRAVENOUS | Status: DC
  Administered 2015-11-02 – 2015-11-04 (×6): 2 g via INTRAVENOUS
  Filled 2015-11-02 (×8): qty 2

## 2015-11-02 MED ORDER — POTASSIUM CHLORIDE 10 MEQ/50ML IV SOLN: 10.0000 meq | INTRAVENOUS | Status: DC

## 2015-11-02 MED ORDER — INSULIN ASPART 100 UNIT/ML ~~LOC~~ SOLN: 0.0000 [IU] | Freq: Every day | SUBCUTANEOUS | Status: DC

## 2015-11-02 NOTE — Progress Notes (Signed)
[place] NOTE  Pharmacy Consult for Cefepime Dosing, Electrolyte Management, Constipation Prevention Indication: HD/ Proteus Bacteremia     Allergies  Allergen Reactions  . Prednisone Anaphylaxis    Patient Measurements: Height: '5\' 7"'$  (170.2 cm) Weight: 266 lb 5.1 oz (120.8 kg) IBW/kg (Calculated) : 61.6   Vital Signs: Temp: 99.1 F (37.3 C) (02/06 1029) Temp Source: Core (Comment) (02/06 0400) BP: 154/60 mmHg (02/06 1029) Pulse Rate: 95 (02/06 1029) Intake/Output from previous day: 02/05 0701 - 02/06 0700 In: 3734.2 [I.V.:3034.2; IV Piggyback:700] Out: 2085 [Urine:2085] Intake/Output from this shift: Total I/O In: 5.1 [I.V.:5.1] Out: -  Vent settings for last 24 hours: Vent Mode:  [-]  FiO2 (%):  [28 %-35 %] 35 %  Labs:  Recent Labs  10/31/15 0500 10/31/15 0649  11/01/15 0455 11/01/15 1300 11/02/15 0509  WBC 28.4*  --   --   --   --  33.5*  HGB 9.9*  --   --   --   --  9.3*  HCT 30.6*  --   --   --   --  29.1*  PLT 211  --   --   --   --  203  CREATININE 1.25*  --   < > 1.09* 1.12* 0.98  MG  --  2.0  --  2.0  --   --   PHOS  --   --   --  3.2  --   --   ALBUMIN  --   --   --   --   --  1.7*  PROT  --   --   --   --   --  5.8*  AST  --   --   --   --   --  20  ALT  --   --   --   --   --  29  ALKPHOS  --   --   --   --   --  80  BILITOT  --   --   --   --   --  1.0  < > = values in this interval not displayed. Estimated Creatinine Clearance: 73 mL/min (by C-G formula based on Cr of 0.98).   Recent Labs  11/02/15 1004 11/02/15 1101 11/02/15 1210  GLUCAP 138* 153* 169*    Microbiology: Recent Results (from the past 720 hour(s))  Urine culture     Status: None   Collection Time: 10/20/15  9:57 AM  Result Value Ref Range Status   Specimen Description URINE, RANDOM  Final   Special Requests NONE  Final   Culture >=100,000 COLONIES/mL PROTEUS MIRABILIS  Final   Report Status 10/22/2015 FINAL  Final   Organism ID, Bacteria  PROTEUS MIRABILIS  Final      Susceptibility   Proteus mirabilis - MIC*    AMPICILLIN <=2 SENSITIVE Sensitive     GENTAMICIN <=1 SENSITIVE Sensitive     CEFTRIAXONE Value in next row Sensitive      SENSITIVE<=1    CIPROFLOXACIN Value in next row Sensitive      SENSITIVE<=0.25    IMIPENEM Value in next row Sensitive      SENSITIVE2    NITROFURANTOIN Value in next row Resistant      RESISTANT128    TRIMETH/SULFA Value in next row Sensitive      SENSITIVE<=20    PIP/TAZO Value in next row Sensitive      SENSITIVE<=4    AMPICILLIN/SULBACTAM  Value in next row Sensitive      SENSITIVE<=2    * >=100,000 COLONIES/mL PROTEUS MIRABILIS  Culture, blood (routine x 2)     Status: None   Collection Time: 10/20/15  9:57 AM  Result Value Ref Range Status   Specimen Description BLOOD RIGHT WRIST  Final   Special Requests   Final    BOTTLES DRAWN AEROBIC AND ANAEROBIC ANA 1ML AER 4ML   Culture  Setup Time   Final    GRAM NEGATIVE RODS IN BOTH AEROBIC AND ANAEROBIC BOTTLES CRITICAL RESULT CALLED TO, READ BACK BY AND VERIFIED WITH: MATT Lemoyne ON 10/21/15 AT 0005 [facility] CONFIRMED BY [place]    Culture   Final    PROTEUS MIRABILIS IN BOTH AEROBIC AND ANAEROBIC BOTTLES    Report Status 10/22/2015 FINAL  Final   Organism ID, Bacteria PROTEUS MIRABILIS  Final      Susceptibility   Proteus mirabilis - MIC*    AMPICILLIN/SULBACTAM Value in next row Sensitive      SENSITIVE<=2    PIP/TAZO Value in next row Sensitive      SENSITIVE<=4    CEFTRIAXONE Value in next row Sensitive      SENSITIVE<=1    IMIPENEM Value in next row Sensitive      SENSITIVE2    GENTAMICIN Value in next row Sensitive      SENSITIVE<=1    CIPROFLOXACIN Value in next row Sensitive      SENSITIVE<=0.25    * PROTEUS MIRABILIS  Culture, blood (routine x 2)     Status: None   Collection Time: 10/20/15  9:57 AM  Result Value Ref Range Status   Specimen Description BLOOD LEFT HAND  Final   Special Requests   Final    BOTTLES  DRAWN AEROBIC AND ANAEROBIC AER 3ML ANA 2ML   Culture  Setup Time   Final    GRAM NEGATIVE RODS IN BOTH AEROBIC AND ANAEROBIC BOTTLES CRITICAL VALUE NOTED.  VALUE IS CONSISTENT WITH PREVIOUSLY REPORTED AND CALLED VALUE.    Culture   Final    PROTEUS MIRABILIS IN BOTH AEROBIC AND ANAEROBIC BOTTLES    Report Status 10/22/2015 FINAL  Final   Organism ID, Bacteria PROTEUS MIRABILIS  Final      Susceptibility   Proteus mirabilis - MIC*    AMPICILLIN Value in next row Sensitive      SENSITIVE<=2    PIP/TAZO Value in next row Sensitive      SENSITIVE<=4    CEFTAZIDIME Value in next row Sensitive      SENSITIVE<=1    CEFTRIAXONE Value in next row Sensitive      SENSITIVE<=1    CEFEPIME Value in next row Sensitive      SENSITIVE<=1    IMIPENEM Value in next row Sensitive      SENSITIVE2    GENTAMICIN Value in next row Sensitive      SENSITIVE<=1    CIPROFLOXACIN Value in next row Sensitive      SENSITIVE<=0.25    * PROTEUS MIRABILIS  Blood Culture ID Panel (Reflexed)     Status: Abnormal   Collection Time: 10/20/15  9:57 AM  Result Value Ref Range Status   Enterococcus species NOT DETECTED NOT DETECTED Final   Listeria monocytogenes NOT DETECTED NOT DETECTED Final   Staphylococcus species NOT DETECTED NOT DETECTED Final   Staphylococcus aureus NOT DETECTED NOT DETECTED Final   Streptococcus species NOT DETECTED NOT DETECTED Final   Streptococcus agalactiae NOT DETECTED NOT DETECTED  Final   Streptococcus pneumoniae NOT DETECTED NOT DETECTED Final   Streptococcus pyogenes NOT DETECTED NOT DETECTED Final   Acinetobacter baumannii NOT DETECTED NOT DETECTED Final   Enterobacteriaceae species DETECTED (A) NOT DETECTED Final    Comment: CRITICAL RESULT CALLED TO, READ BACK BY AND VERIFIED WITH: MATT MCBANE ON 10/21/15 AT 0005 [place]    Enterobacter cloacae complex NOT DETECTED NOT DETECTED Final   Escherichia coli NOT DETECTED NOT DETECTED Final   Klebsiella oxytoca NOT DETECTED NOT  DETECTED Final   Klebsiella pneumoniae NOT DETECTED NOT DETECTED Final   Proteus species (A) NOT DETECTED Final    CRITICAL RESULT CALLED TO, READ BACK BY AND VERIFIED WITH:    Comment: MATT MCBANE ON 10/21/15 AT 0005 [place]   Serratia marcescens NOT DETECTED NOT DETECTED Final   Haemophilus influenzae NOT DETECTED NOT DETECTED Final   Neisseria meningitidis NOT DETECTED NOT DETECTED Final   Pseudomonas aeruginosa NOT DETECTED NOT DETECTED Final   Candida albicans NOT DETECTED NOT DETECTED Final   Candida glabrata NOT DETECTED NOT DETECTED Final   Candida krusei NOT DETECTED NOT DETECTED Final   Candida parapsilosis NOT DETECTED NOT DETECTED Final   Candida tropicalis NOT DETECTED NOT DETECTED Final   Carbapenem resistance NOT DETECTED NOT DETECTED Final   Methicillin resistance NOT DETECTED NOT DETECTED Final   Vancomycin resistance NOT DETECTED NOT DETECTED Final  MRSA PCR Screening     Status: None   Collection Time: 10/20/15  7:41 PM  Result Value Ref Range Status   MRSA by PCR NEGATIVE NEGATIVE Final    Comment:        The GeneXpert MRSA Assay (FDA approved for NASAL specimens only), is one component of a comprehensive MRSA colonization surveillance program. It is not intended to diagnose MRSA infection nor to guide or monitor treatment for MRSA infections.   CULTURE, BLOOD (ROUTINE X 2) w Reflex to PCR ID Panel     Status: None   Collection Time: 10/22/15 11:39 AM  Result Value Ref Range Status   Specimen Description BLOOD LEFT HAND  Final   Special Requests BOTTLES DRAWN AEROBIC AND ANAEROBIC 1CC  Final   Culture NO GROWTH 5 DAYS  Final   Report Status 10/27/2015 FINAL  Final  Culture, expectorated sputum-assessment     Status: None   Collection Time: 10/22/15 12:00 PM  Result Value Ref Range Status   Specimen Description SPUTUM  Final   Special Requests Normal  Final   Sputum evaluation THIS SPECIMEN IS ACCEPTABLE FOR SPUTUM CULTURE  Final   Report Status 10/22/2015  FINAL  Final  Culture, respiratory (NON-Expectorated)     Status: None   Collection Time: 10/22/15 12:00 PM  Result Value Ref Range Status   Specimen Description SPUTUM  Final   Special Requests Normal Reflexed from [id number]  Final   Gram Stain   Final    FAIR SPECIMEN - 70-80% WBCS FEW WBC SEEN RARE YEAST RARE GRAM NEGATIVE RODS    Culture LIGHT GROWTH ENTEROBACTER AEROGENES  Final   Report Status 10/24/2015 FINAL  Final   Organism ID, Bacteria ENTEROBACTER AEROGENES  Final      Susceptibility   Enterobacter aerogenes - MIC*    CEFAZOLIN >=64 RESISTANT Resistant     CEFEPIME <=1 SENSITIVE Sensitive     CEFTAZIDIME <=1 SENSITIVE Sensitive     CEFTRIAXONE <=1 SENSITIVE Sensitive     CIPROFLOXACIN <=0.25 SENSITIVE Sensitive     GENTAMICIN <=1 SENSITIVE Sensitive  IMIPENEM 2 SENSITIVE Sensitive     TRIMETH/SULFA <=20 SENSITIVE Sensitive     PIP/TAZO <=4 SENSITIVE Sensitive     * LIGHT GROWTH ENTEROBACTER AEROGENES  Urine culture     Status: None   Collection Time: 10/22/15 12:45 PM  Result Value Ref Range Status   Specimen Description URINE, RANDOM  Final   Special Requests NONE  Final   Culture NO GROWTH 2 DAYS  Final   Report Status 10/24/2015 FINAL  Final  Urine culture     Status: None   Collection Time: 10/28/15  2:36 PM  Result Value Ref Range Status   Specimen Description URINE, RANDOM  Final   Special Requests NONE  Final   Culture NO GROWTH 2 DAYS  Final   Report Status 10/30/2015 FINAL  Final    Medications:  Scheduled:  . antiseptic oral rinse  7 mL Mouth Rinse q12n4p  . ceFEPime (MAXIPIME) IV  2 g Intravenous Q12H  . chlorhexidine  15 mL Mouth Rinse BID  . free water  200 mL Per Tube Q4H  . heparin subcutaneous  5,000 Units Subcutaneous Q12H  . ipratropium-albuterol  3 mL Nebulization Q6H  . pantoprazole (PROTONIX) IV  40 mg Intravenous Q24H   Infusions:  . dextrose 125 mL/hr at 11/02/15 0700  . insulin (NOVOLIN-R) infusion 2.1 Units/hr (11/02/15 0813)   . norepinephrine (LEVOPHED) Adult infusion Stopped (10/25/15 2259)  . vasopressin (PITRESSIN) infusion - *FOR SHOCK* Stopped (10/23/15 6269)    Assessment: 70 y/o F with septic shock from Proteus UTI. Patient with ARF recently transitioned from CRRT to HD not currently requiring HD.    Plan:  1. Will continue cefepime dosing  2g IV q8 hours for renal function.   2. Electrolytes: Within normal limits. Will recheck with am labs.   3. Constipation: Patient with last BM 2/6. No medications scheduled at this time. Pharmacy will continue to monitor and adjust per consult.   Pharmacy will continue to monitor and adjust per consult.    Currie Paris, PharmD  Clinical Pharmacist   11/02/2015

## 2015-11-02 NOTE — Consult Note (Signed)
Consult: Right hydronephrosis Requested by: Dr. Holley Raring  Chief Complaint: Right hydronephrosis  History of Present Illness: 70 year old white female admitted with sepsis. She had Proteus in the urine and the blood. 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 She initially required CRRT and then hemodialysis. Her kidney function has recovered and now her urine output is excellent and creatinine 0.98. He continues to improve. She is off pressors. She continues to have a white count although she has gangrenous right foot and toes.   History reviewed. No pertinent past medical history. History reviewed. No pertinent past surgical history.  Home Medications:  No prescriptions prior to admission   Allergies:  Allergies  Allergen Reactions  . Prednisone Anaphylaxis    History reviewed. No pertinent family history. Social History:  has no tobacco, alcohol, and drug history on file.  ROS: A complete review of systems was performed.  All systems are negative except for pertinent findings as noted. ROS   Physical Exam:  Vital signs in last 24 hours: Temp:  [99.1 F (37.3 C)-99.9 F (37.7 C)] 99.5 F (37.5 C) (02/06 1300) Pulse Rate:  [86-104] 104 (02/06 1300) Resp:  [22-38] 38 (02/06 1300) BP: (125-167)/(43-75) 167/75 mmHg (02/06 1300) SpO2:  [93 %-99 %] 95 % (02/06 1300) FiO2 (%):  [28 %-35 %] 35 % (02/06 0400) Weight:  [120.8 kg (266 lb 5.1 oz)] 120.8 kg (266 lb 5.1 oz) (02/06 0400)   Intake/Output Summary (Last 24 hours) at 11/02/15 1405 Last data filed at 11/02/15 1331  Gross per 24 hour  Intake 2506.63 ml  Output   1835 ml  Net 671.63 ml     General:  Alert and oriented to hospital, No acute distress Cardiovascular: Regular rate and rhythm Lungs: Regular rate and effort Abdomen: Soft, nontender, nondistended, no abdominal masses Neurologic: Grossly intact GU: Foley catheter in  place-urine clear  Laboratory Data:  Results for orders placed or performed during the hospital encounter of 10/20/15 (from the past 24 hour(s))  Glucose, capillary     Status: Abnormal   Collection Time: 11/01/15  2:23 PM  Result Value Ref Range   Glucose-Capillary 144 (H) 65 - 99 mg/dL  Glucose, capillary     Status: Abnormal   Collection Time: 11/01/15  3:41 PM  Result Value Ref Range   Glucose-Capillary 138 (H) 65 - 99 mg/dL  Glucose, capillary     Status: Abnormal   Collection Time: 11/01/15  4:23 PM  Result Value Ref Range   Glucose-Capillary 149 (H) 65 - 99 mg/dL  Glucose, capillary     Status: Abnormal   Collection Time: 11/01/15  5:32 PM  Result Value Ref Range   Glucose-Capillary 138 (H) 65 - 99 mg/dL  Glucose, capillary     Status: Abnormal   Collection Time: 11/01/15  6:27 PM  Result Value Ref Range   Glucose-Capillary 158 (H) 65 - 99 mg/dL  Glucose, capillary     Status: Abnormal   Collection Time: 11/01/15  7:36 PM  Result Value Ref Range   Glucose-Capillary 158 (H) 65 - 99 mg/dL  Glucose, capillary     Status: Abnormal   Collection Time: 11/01/15  8:02 PM  Result Value Ref Range   Glucose-Capillary 167 (H) 65 - 99 mg/dL  Glucose, capillary     Status: Abnormal   Collection Time: 11/01/15  9:40 PM  Result Value Ref Range   Glucose-Capillary 161 (H) 65 - 99  mg/dL  Glucose, capillary     Status: Abnormal   Collection Time: 11/01/15 10:45 PM  Result Value Ref Range   Glucose-Capillary 174 (H) 65 - 99 mg/dL  Glucose, capillary     Status: Abnormal   Collection Time: 11/01/15 11:49 PM  Result Value Ref Range   Glucose-Capillary 196 (H) 65 - 99 mg/dL  Glucose, capillary     Status: Abnormal   Collection Time: 11/02/15 12:50 AM  Result Value Ref Range   Glucose-Capillary 191 (H) 65 - 99 mg/dL  Glucose, capillary     Status: Abnormal   Collection Time: 11/02/15  1:58 AM  Result Value Ref Range   Glucose-Capillary 150 (H) 65 - 99 mg/dL  Glucose, capillary      Status: Abnormal   Collection Time: 11/02/15  3:01 AM  Result Value Ref Range   Glucose-Capillary 149 (H) 65 - 99 mg/dL  Glucose, capillary     Status: Abnormal   Collection Time: 11/02/15  4:04 AM  Result Value Ref Range   Glucose-Capillary 139 (H) 65 - 99 mg/dL  Comprehensive metabolic panel     Status: Abnormal   Collection Time: 11/02/15  5:09 AM  Result Value Ref Range   Sodium 150 (H) 135 - 145 mmol/L   Potassium 3.5 3.5 - 5.1 mmol/L   Chloride 119 (H) 101 - 111 mmol/L   CO2 25 22 - 32 mmol/L   Glucose, Bld 184 (H) 65 - 99 mg/dL   BUN 30 (H) 6 - 20 mg/dL   Creatinine, Ser 0.98 0.44 - 1.00 mg/dL   Calcium 7.7 (L) 8.9 - 10.3 mg/dL   Total Protein 5.8 (L) 6.5 - 8.1 g/dL   Albumin 1.7 (L) 3.5 - 5.0 g/dL   AST 20 15 - 41 U/L   ALT 29 14 - 54 U/L   Alkaline Phosphatase 80 38 - 126 U/L   Total Bilirubin 1.0 0.3 - 1.2 mg/dL   GFR calc non Af Amer 58 (L) >60 mL/min   GFR calc Af Amer >60 >60 mL/min   Anion gap 6 5 - 15  CBC     Status: Abnormal   Collection Time: 11/02/15  5:09 AM  Result Value Ref Range   WBC 33.5 (H) 3.6 - 11.0 K/uL   RBC 3.09 (L) 3.80 - 5.20 MIL/uL   Hemoglobin 9.3 (L) 12.0 - 16.0 g/dL   HCT 29.1 (L) 35.0 - 47.0 %   MCV 94.1 80.0 - 100.0 fL   MCH 30.2 26.0 - 34.0 pg   MCHC 32.1 32.0 - 36.0 g/dL   RDW 14.5 11.5 - 14.5 %   Platelets 203 150 - 440 K/uL  Glucose, capillary     Status: Abnormal   Collection Time: 11/02/15  5:10 AM  Result Value Ref Range   Glucose-Capillary 145 (H) 65 - 99 mg/dL  Glucose, capillary     Status: Abnormal   Collection Time: 11/02/15  5:43 AM  Result Value Ref Range   Glucose-Capillary 143 (H) 65 - 99 mg/dL  Glucose, capillary     Status: Abnormal   Collection Time: 11/02/15  7:02 AM  Result Value Ref Range   Glucose-Capillary 209 (H) 65 - 99 mg/dL  Glucose, capillary     Status: Abnormal   Collection Time: 11/02/15  8:05 AM  Result Value Ref Range   Glucose-Capillary 162 (H) 65 - 99 mg/dL  Glucose, capillary      Status: Abnormal   Collection Time: 11/02/15  9:05 AM  Result Value Ref Range   Glucose-Capillary 135 (H) 65 - 99 mg/dL  Glucose, capillary     Status: Abnormal   Collection Time: 11/02/15 10:04 AM  Result Value Ref Range   Glucose-Capillary 138 (H) 65 - 99 mg/dL  Glucose, capillary     Status: Abnormal   Collection Time: 11/02/15 11:01 AM  Result Value Ref Range   Glucose-Capillary 153 (H) 65 - 99 mg/dL  Glucose, capillary     Status: Abnormal   Collection Time: 11/02/15 12:10 PM  Result Value Ref Range   Glucose-Capillary 169 (H) 65 - 99 mg/dL  Glucose, capillary     Status: Abnormal   Collection Time: 11/02/15  1:15 PM  Result Value Ref Range   Glucose-Capillary 182 (H) 65 - 99 mg/dL   Recent Results (from the past 240 hour(s))  Urine culture     Status: None   Collection Time: 10/28/15  2:36 PM  Result Value Ref Range Status   Specimen Description URINE, RANDOM  Final   Special Requests NONE  Final   Culture NO GROWTH 2 DAYS  Final   Report Status 10/30/2015 FINAL  Final   Creatinine:  Recent Labs  10/30/15 0431 10/31/15 0500 10/31/15 1708 10/31/15 2124 11/01/15 0455 11/01/15 1300 11/02/15 0509  CREATININE 1.33* 1.25* 1.27* 1.22* 1.09* 1.12* 0.98    Impression/Assessment/sepsis: Sepsis-patient recovering but recovery has been somewhat slow. F/u urine culture was negative. Pt still has cognitive deficits that are being evaluated and gangrenous foot/toes.   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 Could have been part of the inciting event. Patient now stable with normal kidney function and urine output. Discussed patient with Dr. Alva Garnet who does not think patient needs urgent stent. He did order a f/u u/s which is pending. Patient may need f/u Mag-3 with lasix scan vs cysto, stent. Will follow.    April [name] 11/02/2015, 2:01 PM

## 2015-11-02 NOTE — Progress Notes (Signed)
Inpatient Diabetes Program Recommendations  AACE/ADA: New Consensus Statement on Inpatient Glycemic Control (2015)  Target Ranges:  Prepandial:   less than 140 mg/dL      Peak postprandial:   less than 180 mg/dL (1-2 hours)      Critically ill patients:  140 - 180 mg/dL   Review of Glycemic Control:  Results for RAYLEE, STREHL (MRN [id number]) as of 11/02/2015 10:24  Ref. Range 10/21/2015 17:56  Hemoglobin A1C Latest Ref Range: 4.0-6.0 % 7.7 (H)  Results for TANESHA, ARAMBULA (MRN [id number]) as of 11/02/2015 10:24  Ref. Range 11/02/2015 05:43 11/02/2015 07:02 11/02/2015 08:05 11/02/2015 09:05 11/02/2015 10:04  Glucose-Capillary Latest Ref Range: 65-99 mg/dL 143 (H) 209 (H) 162 (H) 135 (H) 138 (H)    Diabetes history: No history Outpatient Diabetes medications: None Current orders for Inpatient glycemic control: IV insulin- ICU glycemic control order set  Inpatient Diabetes Program Recommendations:  Blood sugars improved with insulin drip.  Her current drip rate is 0 units/hr.  May consider d/c of ICU glycemic control order set since patient now extubated and Dys 1 diet started.    Consider Lantus 15 units daily and Novolog sensitive tid with meals and HS.  Will discuss with RN.  Thanks, Adah Perl, RN, BC-ADM Inpatient Diabetes Coordinator Pager [phone] (8a-5p)

## 2015-11-02 NOTE — Consult Note (Signed)
[facility] Infectious Disease     Reason for Consult: sepss, fever, leukocytosis    Referring Physician: Bettey Costa Date of Admission:  10/20/2015   Active Problems:   Sepsis ([place])   Acute renal failure (HCC)   Altered mental status   NSTEMI (non-ST elevated myocardial infarction) ([place])   Sepsis due to urinary tract infection ([place])   Acute renal failure (ARF) ([place])   Hydronephrosis   Endotracheally intubated   Respiratory distress   Arterial hypotension   Respiratory failure ([place])   Proteus septicemia ([place])   HPI: April [name] is a 70 y.o. female with minimal past med hx now in ICU since 1/24.  Admitted 1/24 after being found down likely for 2 days , with ARF, Rhabdo  NSTEMI, and sepsis. Has required intubation and HD but now extubated and renal fxn improving. She had  Bcx and UCX with proteus and sputum with enterobacter. Clinically improving and extubated.  Has developed bil LE gangrene of toes.  She denies current dysuria abd pain, flank pain.   History reviewed. No pertinent past medical history. History reviewed. No pertinent past surgical history. Social History  Substance Use Topics  . Smoking status: Unknown If Ever Smoked  . Smokeless tobacco: None  . Alcohol Use: None   History reviewed. No pertinent family history.  Allergies:  Allergies  Allergen Reactions  . Prednisone Anaphylaxis    Current antibiotics: Antibiotics Given (last 72 hours)    Date/Time Action Medication Dose Rate   10/30/15 2300 Given   ceFEPIme (MAXIPIME) 2 g in dextrose 5 % 50 mL IVPB 2 g 100 mL/hr   10/31/15 1708 Given   ceFEPIme (MAXIPIME) 2 g in dextrose 5 % 50 mL IVPB 2 g 100 mL/hr   10/31/15 2157 Given   ceFEPIme (MAXIPIME) 2 g in dextrose 5 % 50 mL IVPB 2 g 100 mL/hr   11/01/15 1031 Given   ceFEPIme (MAXIPIME) 2 g in dextrose 5 % 50 mL IVPB 2 g 100 mL/hr   11/01/15 2240 Given   ceFEPIme (MAXIPIME) 2 g in dextrose 5 % 50 mL IVPB 2 g 100 mL/hr   11/02/15 1132 Given    ceFEPIme (MAXIPIME) 2 g in dextrose 5 % 50 mL IVPB 2 g 100 mL/hr      MEDICATIONS: . antiseptic oral rinse  7 mL Mouth Rinse q12n4p  . ceFEPime (MAXIPIME) IV  2 g Intravenous 3 times per day  . chlorhexidine  15 mL Mouth Rinse BID  . heparin subcutaneous  5,000 Units Subcutaneous Q12H  . insulin aspart  0-5 Units Subcutaneous QHS  . insulin aspart  0-9 Units Subcutaneous TID WC  . insulin glargine  15 Units Subcutaneous Daily  . ipratropium-albuterol  3 mL Nebulization Q6H  . pantoprazole (PROTONIX) IV  40 mg Intravenous Q24H    Review of Systems - 11 systems reviewed and negative per HPI   OBJECTIVE: Temp:  [99.1 F (37.3 C)-99.9 F (37.7 C)] 99.5 F (37.5 C) (02/06 1300) Pulse Rate:  [86-104] 104 (02/06 1300) Resp:  [22-38] 38 (02/06 1300) BP: (125-167)/(43-75) 167/75 mmHg (02/06 1300) SpO2:  [93 %-99 %] 97 % (02/06 1408) FiO2 (%):  [35 %] 35 % (02/06 0400) Weight:  [120.8 kg (266 lb 5.1 oz)] 120.8 kg (266 lb 5.1 oz) (02/06 0400) Physical Exam  Constitutional:  oriented to person, place, and time. Obese, slowed mentationHENT: [place]/AT, PERRLA, no scleral icterus Mouth/Throat: Oropharynx is clear and dry . No oropharyngeal exudate.  Cardiovascular: Normal rate,  regular rhythm and normal heart sounds.  No murmur heard.  Pulmonary/Chest:bil rhonhci  Neck = supple, no nuchal rigidity Abdominal: Soft. Bowel sounds are normal.  exhibits no distension. There is no tenderness.  Lymphadenopathy: no cervical adenopathy. No axillary adenopathy Neurological: alert and oriented to person, place, and time.  Skin: gangrene bil toes  Psychiatric: a normal mood and affect.  behavior is normal.    LABS: Results for orders placed or performed during the hospital encounter of 10/20/15 (from the past 48 hour(s))  Basic metabolic panel     Status: Abnormal   Collection Time: 10/31/15  5:08 PM  Result Value Ref Range   Sodium 155 (H) 135 - 145 mmol/L   Potassium 3.2 (L) 3.5 - 5.1 mmol/L    Chloride 120 (H) 101 - 111 mmol/L   CO2 29 22 - 32 mmol/L   Glucose, Bld 279 (H) 65 - 99 mg/dL   BUN 42 (H) 6 - 20 mg/dL   Creatinine, Ser 1.27 (H) 0.44 - 1.00 mg/dL   Calcium 7.8 (L) 8.9 - 10.3 mg/dL   GFR calc non Af Amer 42 (L) >60 mL/min   GFR calc Af Amer 49 (L) >60 mL/min    Comment: (NOTE) The eGFR has been calculated using the CKD EPI equation. This calculation has not been validated in all clinical situations. eGFR's persistently <60 mL/min signify possible Chronic Kidney Disease.    Anion gap 6 5 - 15  Glucose, capillary     Status: Abnormal   Collection Time: 10/31/15  7:37 PM  Result Value Ref Range   Glucose-Capillary 218 (H) 65 - 99 mg/dL  Basic metabolic panel     Status: Abnormal   Collection Time: 10/31/15  9:24 PM  Result Value Ref Range   Sodium 157 (H) 135 - 145 mmol/L   Potassium 3.4 (L) 3.5 - 5.1 mmol/L   Chloride 122 (H) 101 - 111 mmol/L   CO2 31 22 - 32 mmol/L   Glucose, Bld 247 (H) 65 - 99 mg/dL   BUN 40 (H) 6 - 20 mg/dL   Creatinine, Ser 1.22 (H) 0.44 - 1.00 mg/dL   Calcium 7.8 (L) 8.9 - 10.3 mg/dL   GFR calc non Af Amer 44 (L) >60 mL/min   GFR calc Af Amer 51 (L) >60 mL/min    Comment: (NOTE) The eGFR has been calculated using the CKD EPI equation. This calculation has not been validated in all clinical situations. eGFR's persistently <60 mL/min signify possible Chronic Kidney Disease.    Anion gap 4 (L) 5 - 15  Glucose, capillary     Status: Abnormal   Collection Time: 10/31/15 10:06 PM  Result Value Ref Range   Glucose-Capillary 235 (H) 65 - 99 mg/dL  Glucose, capillary     Status: Abnormal   Collection Time: 10/31/15 11:13 PM  Result Value Ref Range   Glucose-Capillary 237 (H) 65 - 99 mg/dL  Glucose, capillary     Status: Abnormal   Collection Time: 11/01/15 12:16 AM  Result Value Ref Range   Glucose-Capillary 224 (H) 65 - 99 mg/dL  Glucose, capillary     Status: Abnormal   Collection Time: 11/01/15  1:20 AM  Result Value Ref Range    Glucose-Capillary 186 (H) 65 - 99 mg/dL  Glucose, capillary     Status: Abnormal   Collection Time: 11/01/15  2:25 AM  Result Value Ref Range   Glucose-Capillary 149 (H) 65 - 99 mg/dL  Glucose, capillary  Status: Abnormal   Collection Time: 11/01/15  3:24 AM  Result Value Ref Range   Glucose-Capillary 130 (H) 65 - 99 mg/dL  Glucose, capillary     Status: Abnormal   Collection Time: 11/01/15  4:27 AM  Result Value Ref Range   Glucose-Capillary 142 (H) 65 - 99 mg/dL  Basic metabolic panel     Status: Abnormal   Collection Time: 11/01/15  4:55 AM  Result Value Ref Range   Sodium 156 (H) 135 - 145 mmol/L   Potassium 3.3 (L) 3.5 - 5.1 mmol/L   Chloride 123 (H) 101 - 111 mmol/L   CO2 30 22 - 32 mmol/L   Glucose, Bld 144 (H) 65 - 99 mg/dL   BUN 39 (H) 6 - 20 mg/dL   Creatinine, Ser 1.09 (H) 0.44 - 1.00 mg/dL   Calcium 7.6 (L) 8.9 - 10.3 mg/dL   GFR calc non Af Amer 51 (L) >60 mL/min   GFR calc Af Amer 59 (L) >60 mL/min    Comment: (NOTE) The eGFR has been calculated using the CKD EPI equation. This calculation has not been validated in all clinical situations. eGFR's persistently <60 mL/min signify possible Chronic Kidney Disease.    Anion gap 3 (L) 5 - 15  Magnesium     Status: None   Collection Time: 11/01/15  4:55 AM  Result Value Ref Range   Magnesium 2.0 1.7 - 2.4 mg/dL  Phosphorus     Status: None   Collection Time: 11/01/15  4:55 AM  Result Value Ref Range   Phosphorus 3.2 2.5 - 4.6 mg/dL  Glucose, capillary     Status: Abnormal   Collection Time: 11/01/15  5:07 AM  Result Value Ref Range   Glucose-Capillary 109 (H) 65 - 99 mg/dL  Glucose, capillary     Status: Abnormal   Collection Time: 11/01/15  6:28 AM  Result Value Ref Range   Glucose-Capillary 132 (H) 65 - 99 mg/dL  Glucose, capillary     Status: Abnormal   Collection Time: 11/01/15  7:31 AM  Result Value Ref Range   Glucose-Capillary 139 (H) 65 - 99 mg/dL  Glucose, capillary     Status: Abnormal    Collection Time: 11/01/15  8:36 AM  Result Value Ref Range   Glucose-Capillary 182 (H) 65 - 99 mg/dL  Glucose, capillary     Status: Abnormal   Collection Time: 11/01/15  9:34 AM  Result Value Ref Range   Glucose-Capillary 157 (H) 65 - 99 mg/dL  Glucose, capillary     Status: Abnormal   Collection Time: 11/01/15 10:22 AM  Result Value Ref Range   Glucose-Capillary 168 (H) 65 - 99 mg/dL  Glucose, capillary     Status: Abnormal   Collection Time: 11/01/15 11:23 AM  Result Value Ref Range   Glucose-Capillary 188 (H) 65 - 99 mg/dL  Blood gas, arterial     Status: Abnormal   Collection Time: 11/01/15 11:39 AM  Result Value Ref Range   FIO2 0.28    Delivery systems NASAL CANNULA    pH, Arterial 7.47 (H) 7.350 - 7.450   pCO2 arterial 39 32.0 - 48.0 mmHg   pO2, Arterial 63 (L) 83.0 - 108.0 mmHg   Bicarbonate 28.4 (H) 21.0 - 28.0 mEq/L   Acid-Base Excess 4.4 (H) 0.0 - 3.0 mmol/L   O2 Saturation 93.2 %   Patient temperature 37.0    Collection site LEFT RADIAL    Sample type ARTERIAL DRAW    [place]  test (pass/fail) POSITIVE (A) PASS  Glucose, capillary     Status: Abnormal   Collection Time: 11/01/15 12:36 PM  Result Value Ref Range   Glucose-Capillary 171 (H) 65 - 99 mg/dL  Basic metabolic panel     Status: Abnormal   Collection Time: 11/01/15  1:00 PM  Result Value Ref Range   Sodium 151 (H) 135 - 145 mmol/L   Potassium 3.4 (L) 3.5 - 5.1 mmol/L   Chloride 121 (H) 101 - 111 mmol/L   CO2 27 22 - 32 mmol/L   Glucose, Bld 185 (H) 65 - 99 mg/dL   BUN 34 (H) 6 - 20 mg/dL   Creatinine, Ser 1.12 (H) 0.44 - 1.00 mg/dL   Calcium 7.6 (L) 8.9 - 10.3 mg/dL   GFR calc non Af Amer 49 (L) >60 mL/min   GFR calc Af Amer 57 (L) >60 mL/min    Comment: (NOTE) The eGFR has been calculated using the CKD EPI equation. This calculation has not been validated in all clinical situations. eGFR's persistently <60 mL/min signify possible Chronic Kidney Disease.    Anion gap 3 (L) 5 - 15  Glucose,  capillary     Status: Abnormal   Collection Time: 11/01/15  1:40 PM  Result Value Ref Range   Glucose-Capillary 181 (H) 65 - 99 mg/dL  Glucose, capillary     Status: Abnormal   Collection Time: 11/01/15  2:23 PM  Result Value Ref Range   Glucose-Capillary 144 (H) 65 - 99 mg/dL  Glucose, capillary     Status: Abnormal   Collection Time: 11/01/15  3:41 PM  Result Value Ref Range   Glucose-Capillary 138 (H) 65 - 99 mg/dL  Glucose, capillary     Status: Abnormal   Collection Time: 11/01/15  4:23 PM  Result Value Ref Range   Glucose-Capillary 149 (H) 65 - 99 mg/dL  Glucose, capillary     Status: Abnormal   Collection Time: 11/01/15  5:32 PM  Result Value Ref Range   Glucose-Capillary 138 (H) 65 - 99 mg/dL  Glucose, capillary     Status: Abnormal   Collection Time: 11/01/15  6:27 PM  Result Value Ref Range   Glucose-Capillary 158 (H) 65 - 99 mg/dL  Glucose, capillary     Status: Abnormal   Collection Time: 11/01/15  7:36 PM  Result Value Ref Range   Glucose-Capillary 158 (H) 65 - 99 mg/dL  Glucose, capillary     Status: Abnormal   Collection Time: 11/01/15  8:02 PM  Result Value Ref Range   Glucose-Capillary 167 (H) 65 - 99 mg/dL  Glucose, capillary     Status: Abnormal   Collection Time: 11/01/15  9:40 PM  Result Value Ref Range   Glucose-Capillary 161 (H) 65 - 99 mg/dL  Glucose, capillary     Status: Abnormal   Collection Time: 11/01/15 10:45 PM  Result Value Ref Range   Glucose-Capillary 174 (H) 65 - 99 mg/dL  Glucose, capillary     Status: Abnormal   Collection Time: 11/01/15 11:49 PM  Result Value Ref Range   Glucose-Capillary 196 (H) 65 - 99 mg/dL  Glucose, capillary     Status: Abnormal   Collection Time: 11/02/15 12:50 AM  Result Value Ref Range   Glucose-Capillary 191 (H) 65 - 99 mg/dL  Glucose, capillary     Status: Abnormal   Collection Time: 11/02/15  1:58 AM  Result Value Ref Range   Glucose-Capillary 150 (H) 65 - 99 mg/dL  Glucose, capillary  Status:  Abnormal   Collection Time: 11/02/15  3:01 AM  Result Value Ref Range   Glucose-Capillary 149 (H) 65 - 99 mg/dL  Glucose, capillary     Status: Abnormal   Collection Time: 11/02/15  4:04 AM  Result Value Ref Range   Glucose-Capillary 139 (H) 65 - 99 mg/dL  Comprehensive metabolic panel     Status: Abnormal   Collection Time: 11/02/15  5:09 AM  Result Value Ref Range   Sodium 150 (H) 135 - 145 mmol/L   Potassium 3.5 3.5 - 5.1 mmol/L   Chloride 119 (H) 101 - 111 mmol/L   CO2 25 22 - 32 mmol/L   Glucose, Bld 184 (H) 65 - 99 mg/dL   BUN 30 (H) 6 - 20 mg/dL   Creatinine, Ser 0.98 0.44 - 1.00 mg/dL   Calcium 7.7 (L) 8.9 - 10.3 mg/dL   Total Protein 5.8 (L) 6.5 - 8.1 g/dL   Albumin 1.7 (L) 3.5 - 5.0 g/dL   AST 20 15 - 41 U/L   ALT 29 14 - 54 U/L   Alkaline Phosphatase 80 38 - 126 U/L   Total Bilirubin 1.0 0.3 - 1.2 mg/dL   GFR calc non Af Amer 58 (L) >60 mL/min   GFR calc Af Amer >60 >60 mL/min    Comment: (NOTE) The eGFR has been calculated using the CKD EPI equation. This calculation has not been validated in all clinical situations. eGFR's persistently <60 mL/min signify possible Chronic Kidney Disease.    Anion gap 6 5 - 15  CBC     Status: Abnormal   Collection Time: 11/02/15  5:09 AM  Result Value Ref Range   WBC 33.5 (H) 3.6 - 11.0 K/uL   RBC 3.09 (L) 3.80 - 5.20 MIL/uL   Hemoglobin 9.3 (L) 12.0 - 16.0 g/dL   HCT 29.1 (L) 35.0 - 47.0 %   MCV 94.1 80.0 - 100.0 fL   MCH 30.2 26.0 - 34.0 pg   MCHC 32.1 32.0 - 36.0 g/dL   RDW 14.5 11.5 - 14.5 %   Platelets 203 150 - 440 K/uL  Glucose, capillary     Status: Abnormal   Collection Time: 11/02/15  5:10 AM  Result Value Ref Range   Glucose-Capillary 145 (H) 65 - 99 mg/dL  Glucose, capillary     Status: Abnormal   Collection Time: 11/02/15  5:43 AM  Result Value Ref Range   Glucose-Capillary 143 (H) 65 - 99 mg/dL  Glucose, capillary     Status: Abnormal   Collection Time: 11/02/15  7:02 AM  Result Value Ref Range    Glucose-Capillary 209 (H) 65 - 99 mg/dL  Glucose, capillary     Status: Abnormal   Collection Time: 11/02/15  8:05 AM  Result Value Ref Range   Glucose-Capillary 162 (H) 65 - 99 mg/dL  Glucose, capillary     Status: Abnormal   Collection Time: 11/02/15  9:05 AM  Result Value Ref Range   Glucose-Capillary 135 (H) 65 - 99 mg/dL  Glucose, capillary     Status: Abnormal   Collection Time: 11/02/15 10:04 AM  Result Value Ref Range   Glucose-Capillary 138 (H) 65 - 99 mg/dL  Glucose, capillary     Status: Abnormal   Collection Time: 11/02/15 11:01 AM  Result Value Ref Range   Glucose-Capillary 153 (H) 65 - 99 mg/dL  Glucose, capillary     Status: Abnormal   Collection Time: 11/02/15 12:10 PM  Result Value Ref Range  Glucose-Capillary 169 (H) 65 - 99 mg/dL  Glucose, capillary     Status: Abnormal   Collection Time: 11/02/15  1:15 PM  Result Value Ref Range   Glucose-Capillary 182 (H) 65 - 99 mg/dL  Glucose, capillary     Status: Abnormal   Collection Time: 11/02/15  2:03 PM  Result Value Ref Range   Glucose-Capillary 185 (H) 65 - 99 mg/dL   No components found for: ESR, C REACTIVE PROTEIN MICRO: Recent Results (from the past 720 hour(s))  Urine culture     Status: None   Collection Time: 10/20/15  9:57 AM  Result Value Ref Range Status   Specimen Description URINE, RANDOM  Final   Special Requests NONE  Final   Culture >=100,000 COLONIES/mL PROTEUS MIRABILIS  Final   Report Status 10/22/2015 FINAL  Final   Organism ID, Bacteria PROTEUS MIRABILIS  Final      Susceptibility   Proteus mirabilis - MIC*    AMPICILLIN <=2 SENSITIVE Sensitive     GENTAMICIN <=1 SENSITIVE Sensitive     CEFTRIAXONE Value in next row Sensitive      SENSITIVE<=1    CIPROFLOXACIN Value in next row Sensitive      SENSITIVE<=0.25    IMIPENEM Value in next row Sensitive      SENSITIVE2    NITROFURANTOIN Value in next row Resistant      RESISTANT128    TRIMETH/SULFA Value in next row Sensitive       SENSITIVE<=20    PIP/TAZO Value in next row Sensitive      SENSITIVE<=4    AMPICILLIN/SULBACTAM Value in next row Sensitive      SENSITIVE<=2    * >=100,000 COLONIES/mL PROTEUS MIRABILIS  Culture, blood (routine x 2)     Status: None   Collection Time: 10/20/15  9:57 AM  Result Value Ref Range Status   Specimen Description BLOOD RIGHT WRIST  Final   Special Requests   Final    BOTTLES DRAWN AEROBIC AND ANAEROBIC ANA 1ML AER 4ML   Culture  Setup Time   Final    GRAM NEGATIVE RODS IN BOTH AEROBIC AND ANAEROBIC BOTTLES CRITICAL RESULT CALLED TO, READ BACK BY AND VERIFIED WITH: MATT MCBANE ON 10/21/15 AT 0005 [facility] CONFIRMED BY [place]    Culture   Final    PROTEUS MIRABILIS IN BOTH AEROBIC AND ANAEROBIC BOTTLES    Report Status 10/22/2015 FINAL  Final   Organism ID, Bacteria PROTEUS MIRABILIS  Final      Susceptibility   Proteus mirabilis - MIC*    AMPICILLIN/SULBACTAM Value in next row Sensitive      SENSITIVE<=2    PIP/TAZO Value in next row Sensitive      SENSITIVE<=4    CEFTRIAXONE Value in next row Sensitive      SENSITIVE<=1    IMIPENEM Value in next row Sensitive      SENSITIVE2    GENTAMICIN Value in next row Sensitive      SENSITIVE<=1    CIPROFLOXACIN Value in next row Sensitive      SENSITIVE<=0.25    * PROTEUS MIRABILIS  Culture, blood (routine x 2)     Status: None   Collection Time: 10/20/15  9:57 AM  Result Value Ref Range Status   Specimen Description BLOOD LEFT HAND  Final   Special Requests   Final    BOTTLES DRAWN AEROBIC AND ANAEROBIC AER 3ML ANA 2ML   Culture  Setup Time   Final    GRAM NEGATIVE  RODS IN BOTH AEROBIC AND ANAEROBIC BOTTLES CRITICAL VALUE NOTED.  VALUE IS CONSISTENT WITH PREVIOUSLY REPORTED AND CALLED VALUE.    Culture   Final    PROTEUS MIRABILIS IN BOTH AEROBIC AND ANAEROBIC BOTTLES    Report Status 10/22/2015 FINAL  Final   Organism ID, Bacteria PROTEUS MIRABILIS  Final      Susceptibility   Proteus mirabilis - MIC*    AMPICILLIN  Value in next row Sensitive      SENSITIVE<=2    PIP/TAZO Value in next row Sensitive      SENSITIVE<=4    CEFTAZIDIME Value in next row Sensitive      SENSITIVE<=1    CEFTRIAXONE Value in next row Sensitive      SENSITIVE<=1    CEFEPIME Value in next row Sensitive      SENSITIVE<=1    IMIPENEM Value in next row Sensitive      SENSITIVE2    GENTAMICIN Value in next row Sensitive      SENSITIVE<=1    CIPROFLOXACIN Value in next row Sensitive      SENSITIVE<=0.25    * PROTEUS MIRABILIS  Blood Culture ID Panel (Reflexed)     Status: Abnormal   Collection Time: 10/20/15  9:57 AM  Result Value Ref Range Status   Enterococcus species NOT DETECTED NOT DETECTED Final   Listeria monocytogenes NOT DETECTED NOT DETECTED Final   Staphylococcus species NOT DETECTED NOT DETECTED Final   Staphylococcus aureus NOT DETECTED NOT DETECTED Final   Streptococcus species NOT DETECTED NOT DETECTED Final   Streptococcus agalactiae NOT DETECTED NOT DETECTED Final   Streptococcus pneumoniae NOT DETECTED NOT DETECTED Final   Streptococcus pyogenes NOT DETECTED NOT DETECTED Final   Acinetobacter baumannii NOT DETECTED NOT DETECTED Final   Enterobacteriaceae species DETECTED (A) NOT DETECTED Final    Comment: CRITICAL RESULT CALLED TO, READ BACK BY AND VERIFIED WITH: MATT MCBANE ON 10/21/15 AT 0005 [place]    Enterobacter cloacae complex NOT DETECTED NOT DETECTED Final   Escherichia coli NOT DETECTED NOT DETECTED Final   Klebsiella oxytoca NOT DETECTED NOT DETECTED Final   Klebsiella pneumoniae NOT DETECTED NOT DETECTED Final   Proteus species (A) NOT DETECTED Final    CRITICAL RESULT CALLED TO, READ BACK BY AND VERIFIED WITH:    Comment: MATT MCBANE ON 10/21/15 AT 0005 [place]   Serratia marcescens NOT DETECTED NOT DETECTED Final   Haemophilus influenzae NOT DETECTED NOT DETECTED Final   Neisseria meningitidis NOT DETECTED NOT DETECTED Final   Pseudomonas aeruginosa NOT DETECTED NOT DETECTED Final   Candida  albicans NOT DETECTED NOT DETECTED Final   Candida glabrata NOT DETECTED NOT DETECTED Final   Candida krusei NOT DETECTED NOT DETECTED Final   Candida parapsilosis NOT DETECTED NOT DETECTED Final   Candida tropicalis NOT DETECTED NOT DETECTED Final   Carbapenem resistance NOT DETECTED NOT DETECTED Final   Methicillin resistance NOT DETECTED NOT DETECTED Final   Vancomycin resistance NOT DETECTED NOT DETECTED Final  MRSA PCR Screening     Status: None   Collection Time: 10/20/15  7:41 PM  Result Value Ref Range Status   MRSA by PCR NEGATIVE NEGATIVE Final    Comment:        The GeneXpert MRSA Assay (FDA approved for NASAL specimens only), is one component of a comprehensive MRSA colonization surveillance program. It is not intended to diagnose MRSA infection nor to guide or monitor treatment for MRSA infections.   CULTURE, BLOOD (ROUTINE X 2) w Reflex  to PCR ID Panel     Status: None   Collection Time: 10/22/15 11:39 AM  Result Value Ref Range Status   Specimen Description BLOOD LEFT HAND  Final   Special Requests BOTTLES DRAWN AEROBIC AND ANAEROBIC 1CC  Final   Culture NO GROWTH 5 DAYS  Final   Report Status 10/27/2015 FINAL  Final  Culture, expectorated sputum-assessment     Status: None   Collection Time: 10/22/15 12:00 PM  Result Value Ref Range Status   Specimen Description SPUTUM  Final   Special Requests Normal  Final   Sputum evaluation THIS SPECIMEN IS ACCEPTABLE FOR SPUTUM CULTURE  Final   Report Status 10/22/2015 FINAL  Final  Culture, respiratory (NON-Expectorated)     Status: None   Collection Time: 10/22/15 12:00 PM  Result Value Ref Range Status   Specimen Description SPUTUM  Final   Special Requests Normal Reflexed from [id number]  Final   Gram Stain   Final    FAIR SPECIMEN - 70-80% WBCS FEW WBC SEEN RARE YEAST RARE GRAM NEGATIVE RODS    Culture LIGHT GROWTH ENTEROBACTER AEROGENES  Final   Report Status 10/24/2015 FINAL  Final   Organism ID, Bacteria  ENTEROBACTER AEROGENES  Final      Susceptibility   Enterobacter aerogenes - MIC*    CEFAZOLIN >=64 RESISTANT Resistant     CEFEPIME <=1 SENSITIVE Sensitive     CEFTAZIDIME <=1 SENSITIVE Sensitive     CEFTRIAXONE <=1 SENSITIVE Sensitive     CIPROFLOXACIN <=0.25 SENSITIVE Sensitive     GENTAMICIN <=1 SENSITIVE Sensitive     IMIPENEM 2 SENSITIVE Sensitive     TRIMETH/SULFA <=20 SENSITIVE Sensitive     PIP/TAZO <=4 SENSITIVE Sensitive     * LIGHT GROWTH ENTEROBACTER AEROGENES  Urine culture     Status: None   Collection Time: 10/22/15 12:45 PM  Result Value Ref Range Status   Specimen Description URINE, RANDOM  Final   Special Requests NONE  Final   Culture NO GROWTH 2 DAYS  Final   Report Status 10/24/2015 FINAL  Final  Urine culture     Status: None   Collection Time: 10/28/15  2:36 PM  Result Value Ref Range Status   Specimen Description URINE, RANDOM  Final   Special Requests NONE  Final   Culture NO GROWTH 2 DAYS  Final   Report Status 10/30/2015 FINAL  Final    IMAGING: Ct Abdomen Pelvis Wo Contrast  10/27/2015  CLINICAL DATA:  Sepsis. EXAM: CT ABDOMEN AND PELVIS WITHOUT CONTRAST TECHNIQUE: Multidetector CT imaging of the abdomen and pelvis was performed following the standard protocol without IV contrast. COMPARISON:  CT scan of October 20, 2015. 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 Mild bilateral pleural effusions are noted with adjacent atelectasis. No gallstones are noted. No focal abnormality is noted in the liver, spleen or pancreas on these unenhanced images. Nasogastric tube tip is seen in proximal stomach. Adrenal glands appear normal. Left kidney and ureter appear normal. Moderate right hydronephrosis is again noted with nonobstructive calculus seen in upper pole calyx. 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 Atherosclerosis of abdominal aorta is noted without aneurysm formation. There is no evidence of bowel  obstruction. The appendix appears normal. There is no evidence of bowel obstruction. Urinary bladder is decompressed secondary to Foley catheter. Stable left ovarian cyst is noted. Uterus and right ovary appear normal. No significant adenopathy is noted. Mild anasarca is noted. IMPRESSION: Atherosclerosis of  abdominal aorta without aneurysm formation. Nonobstructive right renal calculus is again noted. 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 Mild anasarca is noted. Mild bilateral pleural effusions are noted with adjacent atelectasis. Electronically Signed   By: Marijo Conception, M.D.   On: 10/27/2015 17:09   Ct Abdomen Pelvis Wo Contrast  10/20/2015  CLINICAL DATA:  Patient had not been hurt from for couple days, was found by EMS between the wall and the bed, responsive to pain, leukocytosis, elevated glucose and creatinine, abdominal swelling, pelvic pain EXAM: CT ABDOMEN AND PELVIS WITHOUT CONTRAST TECHNIQUE: Multidetector CT imaging of the abdomen and pelvis was performed following the standard protocol without IV contrast. Sagittal and coronal MPR images reconstructed from axial data set. COMPARISON:  None FINDINGS: Bibasilar atelectasis. Calcifications at LEFT lung base may represent calcified granuloma within atelectatic lower lobe. Marked RIGHT hydronephrosis without definite ureteral calcification or dilatation. Edema surrounding the RIGHT kidney can be related to obstruction. Small nonobstructing calculus 4 mm diameter RIGHT kidney image 51. Beam hardening artifacts from patient's arms traverse upper abdomen. Within limits of noncontrast technique and beam hardening artifacts, no definite additional abnormalities of the liver, spleen, atrophic pancreas, or kidneys. BILATERAL thickened adrenal glands without discrete mass. Gallbladder distended without mass or surrounding inflammatory changes. Extensive colonic diverticulosis  without evidence of diverticulitis. Stomach and remaining bowel loops normal appearance. Small umbilical hernia containing fat. Normal appendix. Unremarkable uterus and RIGHT adnexa. LEFT ovary prominent size for age at 3.8 x 3.6 x 3.6 cm. Bladder decompressed by Foley catheter. Shotty retroperitoneal nodes without adenopathy. No mass, adenopathy, free air or free fluid. Scattered atherosclerotic calcifications. Bones demineralized. IMPRESSION: RIGHT hydronephrosis question UPJ obstruction; in the setting of leukocytosis, recommend correlation with urinalysis to exclude urinary tract infection. Scattered colonic diverticulosis. Prominent LEFT ovarian size for age ; non emergent followup pelvic and transvaginal sonography recommended to characterize. Electronically Signed   By: Lavonia Dana M.D.   On: 10/20/2015 12:50   Dg Chest 1 View  11/01/2015  CLINICAL DATA:  70 year old female with acute shortness of Breath, found unresponsive at the end of January. Sepsis. Initial encounter. EXAM: CHEST 1 VIEW COMPARISON:  10/31/2015 and earlier. FINDINGS: Portable AP semi upright view at 1204 hours. Large body habitus. Bilateral IJ central lines appear stable. No endotracheal tube or enteric tube. Since 10/20/2015 bilateral small pleural effusions have developed but are stable to mildly improved since yesterday, and improved since 10/30/2015. No pneumothorax. Stable pulmonary vascularity without overt edema. Stable cardiac size and mediastinal contours. No air bronchograms identified to suggest consolidation. IMPRESSION: Bilateral pleural effusions appear small, and somewhat improved since 10/30/2015. Electronically Signed   By: Genevie Ann M.D.   On: 11/01/2015 13:48   Dg Chest 1 View  10/31/2015  CLINICAL DATA:  Dyspnea. EXAM: CHEST 1 VIEW COMPARISON:  10/30/2015 FINDINGS: Right jugular central venous catheter terminates over the lower SVC/ cavoatrial junction, unchanged. Left jugular catheter terminates over the mid SVC.  Cardiac silhouette is partially obscured but grossly unchanged. Thoracic aortic calcification is noted. Lung volumes remain diminished with pulmonary vascular congestion the and mild interstitial edema. Bibasilar opacities are unchanged and likely reflect a combination of atelectasis and small pleural effusions. Old left rib fractures. IMPRESSION: Pulmonary edema, bibasilar atelectasis, and small bilateral pleural effusions. No significant interval change. Electronically Signed   By: Logan Bores M.D.   On: 10/31/2015 07:22   Dg Chest 1 View  10/30/2015  CLINICAL DATA:  Dyspnea, sepsis,  acute renal failure, acute MI. EXAM: CHEST 1 VIEW COMPARISON:  Portable chest x-ray of October 29, 2015 FINDINGS: There has been interval extubation of the trachea and of the esophagus. The lungs are borderline hypoinflated. The pulmonary interstitial markings remain increased bilaterally. The cardiac silhouette remains enlarged. The pulmonary vascularity remains engorged but is slightly more distinct. The hemidiaphragms are partially obscured likely due to small amounts of pleural fluid especially on the left. The right internal jugular catheter tip projects over the distal third of the SVC. The smaller caliber left internal jugular catheter projects over the midportion of the SVC. IMPRESSION: Interval extubation of the trachea and esophagus. Persistent pulmonary interstitial edema and likely small bilateral pleural effusions. Slight interval improvement in pulmonary vascular congestion. Electronically Signed   By: David  Martinique M.D.   On: 10/30/2015 07:24   Dg Chest 1 View  10/29/2015  CLINICAL DATA:  Dyspnea EXAM: CHEST 1 VIEW COMPARISON:  10/28/2015 FINDINGS: Endotracheal tube in good position. Right jugular central venous catheter tip at the cavoatrial atrial junction. Left jugular central venous catheter tip at the cavoatrial junction. NG tube in place with tip not visualized due to underpenetration Diffuse bilateral  airspace disease is unchanged. Slightly improved lung volume. Bibasilar atelectasis and small effusions. IMPRESSION: Support lines remain in good position Diffuse bilateral airspace disease unchanged, most consistent with pulmonary edema. Slightly improved lung volume. Electronically Signed   By: Franchot Gallo M.D.   On: 10/29/2015 07:32   Dg Chest 1 View  10/28/2015  CLINICAL DATA:  Dyspnea, sepsis, acute renal failure, respiratory failure, intubated patient. EXAM: CHEST 1 VIEW COMPARISON:  Portable chest x-ray of October 26, 2015 FINDINGS: The lungs are mildly hypoinflated today. The pulmonary interstitial markings remain increased bilaterally. The hemidiaphragms are now obscured bilaterally. The cardiac silhouette is poorly defined but appears mildly enlarged. The central pulmonary vascularity is prominent. There are probable bilateral pleural effusions. The endotracheal tube tip lies approximately 3 cm above the carina. The right internal jugular venous catheter tip projects over the midportion of the SVC. The left internal jugular venous catheter tip projects over the proximal SVC. The esophagogastric tube tip projects below the inferior margin of the image. IMPRESSION: Worsening of pulmonary interstitial edema and bilateral pleural effusions. This is likely secondary to CHF though bilateral interstitial pneumonia could produce similar findings. Electronically Signed   By: David  Martinique M.D.   On: 10/28/2015 07:35   Dg Chest 1 View  10/26/2015  CLINICAL DATA:  Unresponsive.  Intubation . EXAM: CHEST 1 VIEW COMPARISON:  10/23/2015. FINDINGS: Endotracheal tube, NG tube, right IJ line, left IJ line in stable position. Cardiomegaly with bilateral pulmonary alveolar infiltrates consistent congestive heart failure bilateral pulmonary edema. Low lung volumes with basilar atelectasis. Small bilateral pleural effusions. No pneumothorax. IMPRESSION: 1. Lines and tubes in stable position. 2. Congestive heart failure  with bilateral pulmonary edema and small pleural effusions. Slight interim progression from prior exam. 3. Lung volumes with basilar atelectasis. Electronically Signed   By: Marcello Moores  Register   On: 10/26/2015 07:24   Dg Chest 1 View  10/20/2015  CLINICAL DATA:  Patient found down and minimally responsive 10/20/2015. Status post central line placement. EXAM: CHEST 1 VIEW COMPARISON:  Single view of the chest earlier today. FINDINGS: A new right IJ catheter is in place with the tip projecting over the lower superior vena cava. There is no pneumothorax. Endotracheal tube is again seen. Defibrillator pad is in place. There is no pneumothorax. Small left effusion and  basilar atelectasis are identified. Mild interstitial edema. IMPRESSION: Right IJ catheter tip projects over the lower superior vena cava. Negative for pneumothorax. Mild interstitial edema with a small left effusion and basilar atelectasis. Electronically Signed   By: Inge Rise M.D.   On: 10/20/2015 18:46   Dg Abd 1 View  10/22/2015  CLINICAL DATA:  Abdominal distention. EXAM: ABDOMEN - 1 VIEW COMPARISON:  Radiograph 1257, CT 10/20/2015 FINDINGS: Exam is limited due to patient body habitus. NG tube extends to gastric antral region. No dilated loops of large or small bowel. Gas in the rectum. IMPRESSION: 1. NG tube in stomach. 2. No evidence of bowel obstruction. Electronically Signed   By: Suzy Bouchard M.D.   On: 10/22/2015 15:53   Dg Abd 1 View  10/21/2015  CLINICAL DATA:  Orogastric tube placement. EXAM: ABDOMEN - 1 VIEW COMPARISON:  CT 10/20/2015. FINDINGS: Orogastric tube noted projected over the distal stomach. No bowel distention. Degenerative changes lumbar spine. IMPRESSION: Orogastric tube noted with its tip the left projected over the distal stomach . No bowel distention. Electronically Signed   By: Marcello Moores  Register   On: 10/21/2015 07:21   Ct Head Wo Contrast  10/27/2015  CLINICAL DATA:  Found lying on the floor minimally  responsive EXAM: CT HEAD WITHOUT CONTRAST TECHNIQUE: Contiguous axial images were obtained from the base of the skull through the vertex without intravenous contrast. COMPARISON:  10/20/2015 FINDINGS: No acute cortical infarct, hemorrhage, or mass lesion ispresent. Ventricles are of normal size. No significant extra-axial fluid collection is present. The paranasal sinuses andmastoid air cells are clear. The osseous skull is intact. ET tube is identified. There is mild mucosal thickening involving the left maxillary sinus. The mastoid air cells are clear. The calvarium is intact. IMPRESSION: 1. No acute intracranial abnormalities identified. Electronically Signed   By: Kerby Moors M.D.   On: 10/27/2015 17:00   Ct Head Wo Contrast  10/20/2015  CLINICAL DATA:  Found unresponsive today. EXAM: CT HEAD WITHOUT CONTRAST CT CERVICAL SPINE WITHOUT CONTRAST TECHNIQUE: Multidetector CT imaging of the head and cervical spine was performed following the standard protocol without intravenous contrast. Multiplanar CT image reconstructions of the cervical spine were also generated. COMPARISON:  None. FINDINGS: CT HEAD FINDINGS Normal appearing cerebral hemispheres and posterior fossa structures. Normal size and position of the ventricles. No intracranial hemorrhage, mass lesion or CT evidence of acute infarction. Unremarkable bones. Mild mucosal thickening involving all of the paranasal sinuses with a small amount of fluid in the sphenoid sinus bilaterally. CT CERVICAL SPINE FINDINGS An endotracheal tube is in place. Multilevel degenerative changes. Straightening of the normal cervical lordosis. No prevertebral soft tissue swelling, fractures or subluxations. Bilateral carotid artery calcifications. IMPRESSION: 1. No acute intracranial abnormality. 2. No cervical spine fracture or subluxation. 3. Mild chronic pansinusitis with an acute component in the sphenoid sinuses. 4. Cervical spine degenerative changes. 5. Bilateral  carotid artery atheromatous calcifications. Electronically Signed   By: Claudie Revering M.D.   On: 10/20/2015 13:25   Ct Cervical Spine Wo Contrast  10/20/2015  CLINICAL DATA:  Found unresponsive today. EXAM: CT HEAD WITHOUT CONTRAST CT CERVICAL SPINE WITHOUT CONTRAST TECHNIQUE: Multidetector CT imaging of the head and cervical spine was performed following the standard protocol without intravenous contrast. Multiplanar CT image reconstructions of the cervical spine were also generated. COMPARISON:  None. FINDINGS: CT HEAD FINDINGS Normal appearing cerebral hemispheres and posterior fossa structures. Normal size and position of the ventricles. No intracranial hemorrhage, mass lesion or CT  evidence of acute infarction. Unremarkable bones. Mild mucosal thickening involving all of the paranasal sinuses with a small amount of fluid in the sphenoid sinus bilaterally. CT CERVICAL SPINE FINDINGS An endotracheal tube is in place. Multilevel degenerative changes. Straightening of the normal cervical lordosis. No prevertebral soft tissue swelling, fractures or subluxations. Bilateral carotid artery calcifications. IMPRESSION: 1. No acute intracranial abnormality. 2. No cervical spine fracture or subluxation. 3. Mild chronic pansinusitis with an acute component in the sphenoid sinuses. 4. Cervical spine degenerative changes. 5. Bilateral carotid artery atheromatous calcifications. Electronically Signed   By: Claudie Revering M.D.   On: 10/20/2015 13:25   US Renal  11/02/2015  CLINICAL DATA:  Followup for hydronephrosis. EXAM: RENAL / URINARY TRACT ULTRASOUND COMPLETE COMPARISON:  CT, 10/27/2015.  Renal ultrasound, 10/21/2015. FINDINGS: Right Kidney: Length: 12.5 cm. Normal parenchymal echogenicity. Mild hydronephrosis similar to the prior ultrasound and CT. No mass or stone. Left Kidney: Length: 12.1 cm. Echogenicity within normal limits. No mass or hydronephrosis visualized. Bladder: Foley catheter in place. Bladder still  mildly distended. No bladder mass or stone. IMPRESSION: 1. Mild right hydronephrosis similar to the prior exams. 2. No left hydronephrosis.  No renal masses or stones. Electronically Signed   By: Lajean Manes M.D.   On: 11/02/2015 15:35   US Renal  10/21/2015  CLINICAL DATA:  Acute renal failure. EXAM: RENAL / URINARY TRACT ULTRASOUND COMPLETE COMPARISON:  CT 10/20/2015. FINDINGS: Right Kidney: Length: 12.3 cm. Echogenicity within normal limits. No mass. Mild right hydronephrosis. Left Kidney: Length: 13.0 cm. Echogenicity within normal limits. No mass or hydronephrosis visualized. Bladder: Foley catheter in bladder. Bladder decompressed. Limited exam due to body habitus . IMPRESSION: 1.  Mild right hydronephrosis.  Similar finding noted on prior CT. 2. Foley catheter in bladder.  Bladder decompressed. Electronically Signed   By: Marcello Moores  Register   On: 10/21/2015 10:09   Dg Chest Port 1 View  10/23/2015  CLINICAL DATA:  Respiratory difficulty EXAM: PORTABLE CHEST 1 VIEW COMPARISON:  10/21/2015 FINDINGS: Endotracheal and NG tubes are stable. Left jugular venous catheter with its tip in the upper SVC is stable. Right jugular dialysis catheter with its tip in the upper SVC is stable. Lungs remain under aerated with central and basilar atelectasis. Mild vascular congestion is stable. No pneumothorax. IMPRESSION: Stable tubular devices Stable vascular congestion and bibasilar atelectasis. Electronically Signed   By: Marybelle Killings M.D.   On: 10/23/2015 12:42   Dg Chest Port 1 View  10/21/2015  CLINICAL DATA:  Acute respiratory failure. On ventilator. Acute renal failure. Non ST elevated myocardial infarct. Sepsis. EXAM: PORTABLE CHEST 1 VIEW COMPARISON:  10/20/2015 FINDINGS: New left internal jugular central venous catheter is seen with tip overlying the superior cavoatrial junction. No pneumothorax visualized. Right jugular central venous catheter remains in appropriate position as well as endotracheal tube and  nasogastric tube. Patient is partially rotated to the right. Mild cardiomegaly and probable mild interstitial edema shows no significant change. Bibasilar atelectasis and probable small left pleural effusion are also stable. IMPRESSION: New left internal jugular central venous catheter in appropriate position. No pneumothorax visualized. No significant change in cardiomegaly, probable interstitial edema, bibasilar atelectasis, and probable small left pleural effusion. Electronically Signed   By: Earle Gell M.D.   On: 10/21/2015 16:48   Dg Chest Port 1 View  10/20/2015  CLINICAL DATA:  Hypoxia. EXAM: PORTABLE CHEST 1 VIEW COMPARISON:  None. FINDINGS: Endotracheal tube tip is 4.5 cm above the carina. No pneumothorax. There is no  edema or consolidation. Heart size and pulmonary vascularity within normal limits. No adenopathy. No bone lesions. IMPRESSION: Endotracheal tube as described without pneumothorax. No edema or consolidation. Electronically Signed   By: Lowella Grip III M.D.   On: 10/20/2015 10:53    Assessment April [name] is a 70 y.o. female found down and admitted with ARF< urosepsis with proteus acute resp failure with sputum cxs growing enterobacter.  Clinically improving but has residual LE gangrene, elevated WBC and noted to have R hydro on renal US.   Repeat UA neg and ucx neg. CXR with no onsolidation.  Currently day 14 abx. Last fever 101 on 2/3. WBC remains 33.  Recommendations Cont cefepime. As she improves can transiition to oral therapy with cipro If diarrhea occurs check C diff.  Thank you very much for allowing me to participate in the care of this patient. Please call with questions.   Cheral Marker. Ola Spurr, MD

## 2015-11-02 NOTE — Progress Notes (Signed)
[facility]  ROUNDING NOTE   Subjective:  Seen at bedside. Having bilatearl wheezing.  Good UOP of 3.1 liters over the past 3 shifts.  High WBC count noted. Still has hypernatremia, Na currently 150.  Objective:  Vital signs in last 24 hours:  Temp:  [99.1 F (37.3 C)-99.9 F (37.7 C)] 99.1 F (37.3 C) (02/06 1029) Pulse Rate:  [86-103] 95 (02/06 1029) Resp:  [23-32] 28 (02/06 1029) BP: (125-162)/(43-79) 154/60 mmHg (02/06 1029) SpO2:  [93 %-99 %] 97 % (02/06 1029) FiO2 (%):  [28 %-35 %] 35 % (02/06 0400) Weight:  [120.8 kg (266 lb 5.1 oz)] 120.8 kg (266 lb 5.1 oz) (02/06 0400)  Weight change: -0.2 kg (-7.1 oz) Filed Weights   10/31/15 0500 11/01/15 0500 11/02/15 0400  Weight: 122.6 kg (270 lb 4.5 oz) 121 kg (266 lb 12.1 oz) 120.8 kg (266 lb 5.1 oz)    Intake/Output: I/O last 3 completed shifts: In: 5710.8 [I.V.:4560.8; IV Piggyback:1150] Out: 1660 [Urine:3185]   Intake/Output this shift:  Total I/O In: 5.1 [I.V.:5.1] Out: -   Physical Exam: General: Critically ill appearing   Head: [place]/AT facemask on  Eyes: Eyes open   Neck: supple  Lungs:  bilateral wheezing, face mask on  Heart: Regular, no rub   Abdomen:  Soft, nontender, obese  Extremities: 3+ peripheral edema. +anasarca  Neurologic:  alert, able to follow simple commands   Skin: Feet in soft support, necrosis in toes  Access: RIJ vascath 10/20/15 Dr. Stevenson Clinch    Basic Metabolic Panel:  Recent Labs Lab 10/28/15 0443  10/30/15 0431  10/31/15 6301  10/31/15 1708 10/31/15 2124 11/01/15 0455 11/01/15 1300 11/02/15 0509  NA 152*  < > 157*  < >  --   --  155* 157* 156* 151* 150*  K 3.9  < > 3.7  < >  --   < > 3.2* 3.4* 3.3* 3.4* 3.5  CL 119*  < > 121*  < >  --   --  120* 122* 123* 121* 119*  CO2 29  < > 31  < >  --   --  '29 31 30 27 25  '$ GLUCOSE 209*  < > 171*  < >  --   --  279* 247* 144* 185* 184*  BUN 71*  < > 54*  < >  --   --  42* 40* 39* 34* 30*  CREATININE 1.60*  < > 1.33*  < >  --    --  1.27* 1.22* 1.09* 1.12* 0.98  CALCIUM 7.7*  < > 8.1*  < >  --   --  7.8* 7.8* 7.6* 7.6* 7.7*  MG 2.3  --  2.1  --  2.0  --   --   --  2.0  --   --   PHOS  --   --  3.3  --   --   --   --   --  3.2  --   --   < > = values in this interval not displayed.  Liver Function Tests:  Recent Labs Lab 11/02/15 0509  AST 20  ALT 29  ALKPHOS 80  BILITOT 1.0  PROT 5.8*  ALBUMIN 1.7*   No results for input(s): LIPASE, AMYLASE in the last 168 hours.  Recent Labs Lab 10/31/15 0649  AMMONIA 18    CBC:  Recent Labs Lab 10/28/15 0443 10/29/15 0421 10/30/15 0431 10/31/15 0500 11/02/15 0509  WBC 26.2* 31.0* 29.4* 28.4* 33.5*  HGB 9.7* 9.4* 10.0* 9.9* 9.3*  HCT 29.9* 29.0* 30.8* 30.6* 29.1*  MCV 93.0 92.1 94.1 93.7 94.1  PLT 109* 132* 181 211 203    Cardiac Enzymes: No results for input(s): CKTOTAL, CKMB, CKMBINDEX, TROPONINI in the last 168 hours.  BNP: Invalid input(s): POCBNP  CBG:  Recent Labs Lab 11/02/15 0543 11/02/15 0702 11/02/15 0805 11/02/15 0905 11/02/15 1004  GLUCAP 143* 209* 162* 135* 138*    Microbiology: Results for orders placed or performed during the hospital encounter of 10/20/15  Urine culture     Status: None   Collection Time: 10/20/15  9:57 AM  Result Value Ref Range Status   Specimen Description URINE, RANDOM  Final   Special Requests NONE  Final   Culture >=100,000 COLONIES/mL PROTEUS MIRABILIS  Final   Report Status 10/22/2015 FINAL  Final   Organism ID, Bacteria PROTEUS MIRABILIS  Final      Susceptibility   Proteus mirabilis - MIC*    AMPICILLIN <=2 SENSITIVE Sensitive     GENTAMICIN <=1 SENSITIVE Sensitive     CEFTRIAXONE Value in next row Sensitive      SENSITIVE<=1    CIPROFLOXACIN Value in next row Sensitive      SENSITIVE<=0.25    IMIPENEM Value in next row Sensitive      SENSITIVE2    NITROFURANTOIN Value in next row Resistant      RESISTANT128    TRIMETH/SULFA Value in next row Sensitive      SENSITIVE<=20     PIP/TAZO Value in next row Sensitive      SENSITIVE<=4    AMPICILLIN/SULBACTAM Value in next row Sensitive      SENSITIVE<=2    * >=100,000 COLONIES/mL PROTEUS MIRABILIS  Culture, blood (routine x 2)     Status: None   Collection Time: 10/20/15  9:57 AM  Result Value Ref Range Status   Specimen Description BLOOD RIGHT WRIST  Final   Special Requests   Final    BOTTLES DRAWN AEROBIC AND ANAEROBIC ANA 1ML AER 4ML   Culture  Setup Time   Final    GRAM NEGATIVE RODS IN BOTH AEROBIC AND ANAEROBIC BOTTLES CRITICAL RESULT CALLED TO, READ BACK BY AND VERIFIED WITH: MATT MCBANE ON 10/21/15 AT 0005 [facility] CONFIRMED BY [place]    Culture   Final    PROTEUS MIRABILIS IN BOTH AEROBIC AND ANAEROBIC BOTTLES    Report Status 10/22/2015 FINAL  Final   Organism ID, Bacteria PROTEUS MIRABILIS  Final      Susceptibility   Proteus mirabilis - MIC*    AMPICILLIN/SULBACTAM Value in next row Sensitive      SENSITIVE<=2    PIP/TAZO Value in next row Sensitive      SENSITIVE<=4    CEFTRIAXONE Value in next row Sensitive      SENSITIVE<=1    IMIPENEM Value in next row Sensitive      SENSITIVE2    GENTAMICIN Value in next row Sensitive      SENSITIVE<=1    CIPROFLOXACIN Value in next row Sensitive      SENSITIVE<=0.25    * PROTEUS MIRABILIS  Culture, blood (routine x 2)     Status: None   Collection Time: 10/20/15  9:57 AM  Result Value Ref Range Status   Specimen Description BLOOD LEFT HAND  Final   Special Requests   Final    BOTTLES DRAWN AEROBIC AND ANAEROBIC AER 3ML ANA 2ML   Culture  Setup Time   Final  GRAM NEGATIVE RODS IN BOTH AEROBIC AND ANAEROBIC BOTTLES CRITICAL VALUE NOTED.  VALUE IS CONSISTENT WITH PREVIOUSLY REPORTED AND CALLED VALUE.    Culture   Final    PROTEUS MIRABILIS IN BOTH AEROBIC AND ANAEROBIC BOTTLES    Report Status 10/22/2015 FINAL  Final   Organism ID, Bacteria PROTEUS MIRABILIS  Final      Susceptibility   Proteus mirabilis - MIC*    AMPICILLIN Value in next row  Sensitive      SENSITIVE<=2    PIP/TAZO Value in next row Sensitive      SENSITIVE<=4    CEFTAZIDIME Value in next row Sensitive      SENSITIVE<=1    CEFTRIAXONE Value in next row Sensitive      SENSITIVE<=1    CEFEPIME Value in next row Sensitive      SENSITIVE<=1    IMIPENEM Value in next row Sensitive      SENSITIVE2    GENTAMICIN Value in next row Sensitive      SENSITIVE<=1    CIPROFLOXACIN Value in next row Sensitive      SENSITIVE<=0.25    * PROTEUS MIRABILIS  Blood Culture ID Panel (Reflexed)     Status: Abnormal   Collection Time: 10/20/15  9:57 AM  Result Value Ref Range Status   Enterococcus species NOT DETECTED NOT DETECTED Final   Listeria monocytogenes NOT DETECTED NOT DETECTED Final   Staphylococcus species NOT DETECTED NOT DETECTED Final   Staphylococcus aureus NOT DETECTED NOT DETECTED Final   Streptococcus species NOT DETECTED NOT DETECTED Final   Streptococcus agalactiae NOT DETECTED NOT DETECTED Final   Streptococcus pneumoniae NOT DETECTED NOT DETECTED Final   Streptococcus pyogenes NOT DETECTED NOT DETECTED Final   Acinetobacter baumannii NOT DETECTED NOT DETECTED Final   Enterobacteriaceae species DETECTED (A) NOT DETECTED Final    Comment: CRITICAL RESULT CALLED TO, READ BACK BY AND VERIFIED WITH: MATT MCBANE ON 10/21/15 AT 0005 [place]    Enterobacter cloacae complex NOT DETECTED NOT DETECTED Final   Escherichia coli NOT DETECTED NOT DETECTED Final   Klebsiella oxytoca NOT DETECTED NOT DETECTED Final   Klebsiella pneumoniae NOT DETECTED NOT DETECTED Final   Proteus species (A) NOT DETECTED Final    CRITICAL RESULT CALLED TO, READ BACK BY AND VERIFIED WITH:    Comment: MATT MCBANE ON 10/21/15 AT 0005 [place]   Serratia marcescens NOT DETECTED NOT DETECTED Final   Haemophilus influenzae NOT DETECTED NOT DETECTED Final   Neisseria meningitidis NOT DETECTED NOT DETECTED Final   Pseudomonas aeruginosa NOT DETECTED NOT DETECTED Final   Candida albicans NOT  DETECTED NOT DETECTED Final   Candida glabrata NOT DETECTED NOT DETECTED Final   Candida krusei NOT DETECTED NOT DETECTED Final   Candida parapsilosis NOT DETECTED NOT DETECTED Final   Candida tropicalis NOT DETECTED NOT DETECTED Final   Carbapenem resistance NOT DETECTED NOT DETECTED Final   Methicillin resistance NOT DETECTED NOT DETECTED Final   Vancomycin resistance NOT DETECTED NOT DETECTED Final  MRSA PCR Screening     Status: None   Collection Time: 10/20/15  7:41 PM  Result Value Ref Range Status   MRSA by PCR NEGATIVE NEGATIVE Final    Comment:        The GeneXpert MRSA Assay (FDA approved for NASAL specimens only), is one component of a comprehensive MRSA colonization surveillance program. It is not intended to diagnose MRSA infection nor to guide or monitor treatment for MRSA infections.   CULTURE, BLOOD (ROUTINE X 2)  w Reflex to PCR ID Panel     Status: None   Collection Time: 10/22/15 11:39 AM  Result Value Ref Range Status   Specimen Description BLOOD LEFT HAND  Final   Special Requests BOTTLES DRAWN AEROBIC AND ANAEROBIC 1CC  Final   Culture NO GROWTH 5 DAYS  Final   Report Status 10/27/2015 FINAL  Final  Culture, expectorated sputum-assessment     Status: None   Collection Time: 10/22/15 12:00 PM  Result Value Ref Range Status   Specimen Description SPUTUM  Final   Special Requests Normal  Final   Sputum evaluation THIS SPECIMEN IS ACCEPTABLE FOR SPUTUM CULTURE  Final   Report Status 10/22/2015 FINAL  Final  Culture, respiratory (NON-Expectorated)     Status: None   Collection Time: 10/22/15 12:00 PM  Result Value Ref Range Status   Specimen Description SPUTUM  Final   Special Requests Normal Reflexed from [id number]  Final   Gram Stain   Final    FAIR SPECIMEN - 70-80% WBCS FEW WBC SEEN RARE YEAST RARE GRAM NEGATIVE RODS    Culture LIGHT GROWTH ENTEROBACTER AEROGENES  Final   Report Status 10/24/2015 FINAL  Final   Organism ID, Bacteria ENTEROBACTER  AEROGENES  Final      Susceptibility   Enterobacter aerogenes - MIC*    CEFAZOLIN >=64 RESISTANT Resistant     CEFEPIME <=1 SENSITIVE Sensitive     CEFTAZIDIME <=1 SENSITIVE Sensitive     CEFTRIAXONE <=1 SENSITIVE Sensitive     CIPROFLOXACIN <=0.25 SENSITIVE Sensitive     GENTAMICIN <=1 SENSITIVE Sensitive     IMIPENEM 2 SENSITIVE Sensitive     TRIMETH/SULFA <=20 SENSITIVE Sensitive     PIP/TAZO <=4 SENSITIVE Sensitive     * LIGHT GROWTH ENTEROBACTER AEROGENES  Urine culture     Status: None   Collection Time: 10/22/15 12:45 PM  Result Value Ref Range Status   Specimen Description URINE, RANDOM  Final   Special Requests NONE  Final   Culture NO GROWTH 2 DAYS  Final   Report Status 10/24/2015 FINAL  Final  Urine culture     Status: None   Collection Time: 10/28/15  2:36 PM  Result Value Ref Range Status   Specimen Description URINE, RANDOM  Final   Special Requests NONE  Final   Culture NO GROWTH 2 DAYS  Final   Report Status 10/30/2015 FINAL  Final    Coagulation Studies: No results for input(s): LABPROT, INR in the last 72 hours.  Urinalysis: No results for input(s): COLORURINE, LABSPEC, PHURINE, GLUCOSEU, HGBUR, BILIRUBINUR, KETONESUR, PROTEINUR, UROBILINOGEN, NITRITE, LEUKOCYTESUR in the last 72 hours.  Invalid input(s): APPERANCEUR    Imaging: Dg Chest 1 View  11/01/2015  CLINICAL DATA:  70 year old female with acute shortness of Breath, found unresponsive at the end of January. Sepsis. Initial encounter. EXAM: CHEST 1 VIEW COMPARISON:  10/31/2015 and earlier. FINDINGS: Portable AP semi upright view at 1204 hours. Large body habitus. Bilateral IJ central lines appear stable. No endotracheal tube or enteric tube. Since 10/20/2015 bilateral small pleural effusions have developed but are stable to mildly improved since yesterday, and improved since 10/30/2015. No pneumothorax. Stable pulmonary vascularity without overt edema. Stable cardiac size and mediastinal contours. No  air bronchograms identified to suggest consolidation. IMPRESSION: Bilateral pleural effusions appear small, and somewhat improved since 10/30/2015. Electronically Signed   By: Genevie Ann M.D.   On: 11/01/2015 13:48     Medications:   . dextrose 125 mL/hr at 11/02/15  0700  . insulin (NOVOLIN-R) infusion 2.1 Units/hr (11/02/15 0813)  . norepinephrine (LEVOPHED) Adult infusion Stopped (10/25/15 2259)  . vasopressin (PITRESSIN) infusion - *FOR SHOCK* Stopped (10/23/15 0950)   . antiseptic oral rinse  7 mL Mouth Rinse q12n4p  . ceFEPime (MAXIPIME) IV  2 g Intravenous Q12H  . chlorhexidine  15 mL Mouth Rinse BID  . free water  200 mL Per Tube Q4H  . heparin subcutaneous  5,000 Units Subcutaneous Q12H  . ipratropium-albuterol  3 mL Nebulization Q6H  . pantoprazole (PROTONIX) IV  40 mg Intravenous Q24H   [DISCONTINUED] acetaminophen **OR** acetaminophen, bisacodyl, fentaNYL (SUBLIMAZE) injection, iohexol, labetalol, LORazepam, morphine injection, ondansetron (ZOFRAN) IV, prochlorperazine, sodium chloride flush  Assessment/ Plan:  Ms. ALECE KOPPEL is a 70 y.o. white female with DM (A1c 7.7%),  who was admitted to [facility] on 10/20/2015  1. Acute renal failure with metabolic acidosis:  - Unknown baseline creatinine. Admit Cr 3.01 Required CRRT from 1/24 to 1/27. Then intermittent hemodialysis 1/28. Acute renal failure from sepsis leading to ATN. Serologic testing negative 10/21/15 - Renal function has improved, good UOP, no further indication for dialysis.  2. Hypernatremia - from post ATN diuresis - hesitant to increase IVF rate any further at the moment, continue to monitor serum Na.  3. Hypokalemia: post ATN diuresis.  - improved, K up to 3.5 today.  4. Sepsis/hypotension secondary to urinary tract infection: proteus - on cefepime at the moment.  5. Anasarca - should improve as renal function improves.   6. 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   7. 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    LOS: 13 April [name] 2/6/201710:34 AM

## 2015-11-02 NOTE — Progress Notes (Signed)
Chaplain rounded in the unit and provided a compassionate presence and support to the patient. Said silent prayer. Minerva Fester [phone]

## 2015-11-02 NOTE — Evaluation (Signed)
Clinical/Bedside Swallow Evaluation Patient Details  Name: April [name] MRN: [id number] Date of Birth: 20-Jul-1946  Today's Date: 11/02/2015 Time: SLP Start Time (ACUTE ONLY): 1062 SLP Stop Time (ACUTE ONLY): 1145 SLP Time Calculation (min) (ACUTE ONLY): 60 min  Past Medical History: History reviewed. No pertinent past medical history. Past Surgical History: History reviewed. No pertinent past surgical history. HPI:  Pt is a 70 y.o. female with no past medical history comes to the emergency room after she was found lying on the floor minimally responsive. Patient had not shown up to work for 2 days and coworkers checked on her and they found her lying on the floor. Down time not known. She was reportedly severely swollen at baseline by coworker. Pt was emergently intubated upon arrival to the ED. She has since been extubated on Feb. 2 but has required BiPAP support intermittently and has not been able to participate in a BSE. There is some question that pt is not at her baseline mental status as well. Pt continues to have respiratory issues requiring increased O2 support per RT and NSG. Pt is awake w/ noted increased effortful breathing - RR mid 20's, HR 98, O2 sats 95%. Pt followed 2 basic commands; noted reduced cough effort at times.    Assessment / Plan / Recommendation Clinical Impression  Pt appears at increased risk for aspiration sec. to declined respiratory status and the increased WOB w/ any exertion including trials of ice chips(single). When pt was given single ice chip trials, noted a more open mouth posture for respiratory need/demand and slower manipulation of the ice chip trials. Pt demo. a swallow and cleared adequately w/ clear vocal quality b/t trials; no overt s/s of aspiration noted w/ trials. With the increased effort of the ice chip task, increased RR from the low-mid 20's - upper 20's and 31 was noted. Rest breaks were given b/t trials to lessen WOB and exertion. Pt does not  appear ready for an oral diet at this time d/t the increased risk for aspiration sec. to her decline respiratory status. MD consulted and agreed w/ ice chips for [place] - strict aspiration precautions including only single chips w/ rest breaks b/t and to not give if any increased WOB or s/s of aspiration noted. ST will f/u w/ continued assessment of swallow function and readiness for an oral diet. NSG updated and agreed.    Aspiration Risk  Moderate aspiration risk    Diet Recommendation  NPO except for single ice chips; strict aspiration precautions; feeding assistance  Medication Administration: Via alternative means    Other  Recommendations Recommended Consults:  (Dietician following) Oral Care Recommendations: Oral care QID;Staff/trained caregiver to provide oral care (while NPO status except ice chips) Other Recommendations:  (TBD)   Follow up Recommendations  Skilled Nursing facility (TBD)    Frequency and Duration min 3x week  2 weeks       Prognosis Prognosis for Safe Diet Advancement: Guarded Barriers to Reach Goals:  (respiratory status)      Swallow Study   General Date of Onset: 10/20/15 HPI: Pt is a 70 y.o. female with no past medical history comes to the emergency room after she was found lying on the floor minimally responsive. Patient had not shown up to work for 2 days and coworkers checked on her and they found her lying on the floor. Down time not known. She was reportedly severely swollen at baseline by coworker. Pt was emergently intubated upon arrival to the ED.  She has since been extubated on Feb. 2 but has required BiPAP support intermittently and has not been able to participate in a BSE. There is some question that pt is not at her baseline mental status as well. Pt continues to have respiratory issues requiring increased O2 support per RT and NSG. Pt is awake w/ noted increased effortful breathing - RR mid 20's, HR 98, O2 sats 95%. Pt followed 2 basic  commands; noted reduced cough effort at times.  Type of Study: Bedside Swallow Evaluation Previous Swallow Assessment: none  Diet Prior to this Study: Regular;Thin liquids (at home prior to admission) Temperature Spikes Noted: Yes (wbc 33.5; temp 99.9; Sodium 150 per labs) Respiratory Status: Increased respiratory rate;Venti-mask (open at mouth) History of Recent Intubation: Yes Length of Intubations (days): 10 days Date extubated: 10/29/15 Behavior/Cognition: Alert;Cooperative;Pleasant mood;Requires cueing (awake) Oral Cavity Assessment: Dry Oral Care Completed by SLP: Yes Oral Cavity - Dentition: Adequate natural dentition Vision:  (n/a) Self-Feeding Abilities: Total assist (noted UE fullness) Patient Positioning: Upright in bed Baseline Vocal Quality: Low vocal intensity Volitional Cough: Weak Volitional Swallow: Able to elicit    Oral/Motor/Sensory Function Overall Oral Motor/Sensory Function: Within functional limits (grossly)   Ice Chips Ice chips: Impaired Presentation: Spoon (5 trials) Oral Phase Impairments:  (open mouth posture d/t respiratory demand) Oral Phase Functional Implications:  (slower manipulation) Pharyngeal Phase Impairments:  (none) Other Comments: increased RR from the low-mid 20's - upper 20's and 31.   Thin Liquid Thin Liquid: Not tested    Nectar Thick Nectar Thick Liquid: Not tested   Honey Thick Honey Thick Liquid: Not tested   Puree Puree: Not tested   Solid   GO   Solid: Not tested       Orinda Kenner, MS, CCC-SLP  Charisse Wendell 11/02/2015,12:00 PM

## 2015-11-02 NOTE — Progress Notes (Signed)
[facility] Electrolyte Replacement Protocol  Patient Name: April [name] DOB: May 10, 1946 MRN: [id number]  Date of Service  11/02/2015   HPI/Events of Note    Recent Labs Lab 10/28/15 0443  10/30/15 0431  10/31/15 0649  10/31/15 1708 10/31/15 2124 11/01/15 0455 11/01/15 1300 11/02/15 0509  NA 152*  < > 157*  < >  --   --  155* 157* 156* 151* 150*  K 3.9  < > 3.7  < >  --   < > 3.2* 3.4* 3.3* 3.4* 3.5  CL 119*  < > 121*  < >  --   --  120* 122* 123* 121* 119*  CO2 29  < > 31  < >  --   --  '29 31 30 27 25  ' GLUCOSE 209*  < > 171*  < >  --   --  279* 247* 144* 185* 184*  BUN 71*  < > 54*  < >  --   --  42* 40* 39* 34* 30*  CREATININE 1.60*  < > 1.33*  < >  --   --  1.27* 1.22* 1.09* 1.12* 0.98  CALCIUM 7.7*  < > 8.1*  < >  --   --  7.8* 7.8* 7.6* 7.6* 7.7*  MG 2.3  --  2.1  --  2.0  --   --   --  2.0  --   --   PHOS  --   --  3.3  --   --   --   --   --  3.2  --   --   < > = values in this interval not displayed.  Estimated Creatinine Clearance: 73 mL/min (by C-G formula based on Cr of 0.98).  Intake/Output      02/05 0701 - 02/06 0700   I.V. (mL/kg) 2909.2 (24.1)   IV Piggyback 700   Total Intake(mL/kg) 3609.2 (29.9)   Urine (mL/kg/hr) 2035 (0.7)   Total Output 2035   Net +1574.2        - I/O DETAILED x24h    Total I/O In: 1440.1 [I.V.:1390.1; IV Piggyback:50] Out: 595 [Urine:595] - I/O THIS SHIFT    ASSESSMENT   eICURN Interventions   Electrolyte protocol criteria met.  Replace per protocol.  MD notified   ASSESSMENT: MAJOR ELECTROLYTE    Lorene Dy 11/02/2015, 6:22 AM

## 2015-11-02 NOTE — Progress Notes (Signed)
Pt placed on Bipap due to increased WOB

## 2015-11-02 NOTE — Progress Notes (Signed)
Patient ID: April [name], female   DOB: 08-04-1946, 70 y.o.   MRN: [id number] [facility] PROGRESS NOTE  April [name] NWG:[id number] DOB: [birth year] DOA: 10/20/2015 PCP: Albina Billet, MD  HPI/Subjective: Patient answers some yes or no questions. Some stridor with breathing. Short shallow breaths. The patient does not complain of any shortness of breath or cough.  Objective: Filed Vitals:   11/02/15 1200 11/02/15 1300  BP: 159/59 167/75  Pulse: 101 104  Temp: 99.3 F (37.4 C) 99.5 F (37.5 C)  Resp: 29 38    Filed Weights   10/31/15 0500 11/01/15 0500 11/02/15 0400  Weight: 122.6 kg (270 lb 4.5 oz) 121 kg (266 lb 12.1 oz) 120.8 kg (266 lb 5.1 oz)    ROS: Review of Systems  Unable to perform ROS Respiratory: Negative for shortness of breath.   Gastrointestinal: Negative for abdominal pain.   Limited with tachypnea  Exam: Physical Exam  HENT:  Nose: No mucosal edema.  Mouth/Throat: No oropharyngeal exudate or posterior oropharyngeal edema.  Eyes: Conjunctivae, EOM and lids are normal. Pupils are equal, round, and reactive to light.  Neck: No JVD present. Carotid bruit is not present. No edema present. No thyroid mass and no thyromegaly present.  Cardiovascular: S1 normal and S2 normal.  Exam reveals no gallop.   No murmur heard. Pulses:      Dorsalis pedis pulses are 2+ on the right side, and 2+ on the left side.  Respiratory: Accessory muscle usage present. She is in respiratory distress. She has decreased breath sounds in the right middle field, the right lower field, the left middle field and the left lower field. She has no wheezes. She has no rhonchi. She has rales in the right lower field and the left lower field.  Upper airway stridor  GI: Soft. Bowel sounds are normal. There is no tenderness.  Musculoskeletal:       Right ankle: She exhibits swelling.       Left ankle: She exhibits swelling.  Lymphadenopathy:    She has no cervical adenopathy.   Neurological: She is alert.  Just barely able to lift arms up off the bed.  Skin: Skin is warm. Nails show no clubbing.  Right leg demarcated halfway up the lower leg. Gangrene all the toes and right foot. Left foot demarcated and left toes gangrenous.  Psychiatric: She has a normal mood and affect.      Data Reviewed: Basic Metabolic Panel:  Recent Labs Lab 10/28/15 0443  10/30/15 0431  10/31/15 5784  10/31/15 1708 10/31/15 2124 11/01/15 0455 11/01/15 1300 11/02/15 0509  NA 152*  < > 157*  < >  --   --  155* 157* 156* 151* 150*  K 3.9  < > 3.7  < >  --   < > 3.2* 3.4* 3.3* 3.4* 3.5  CL 119*  < > 121*  < >  --   --  120* 122* 123* 121* 119*  CO2 29  < > 31  < >  --   --  '29 31 30 27 25  '$ GLUCOSE 209*  < > 171*  < >  --   --  279* 247* 144* 185* 184*  BUN 71*  < > 54*  < >  --   --  42* 40* 39* 34* 30*  CREATININE 1.60*  < > 1.33*  < >  --   --  1.27* 1.22* 1.09* 1.12* 0.98  CALCIUM 7.7*  < > 8.1*  < >  --   --  7.8* 7.8* 7.6* 7.6* 7.7*  MG 2.3  --  2.1  --  2.0  --   --   --  2.0  --   --   PHOS  --   --  3.3  --   --   --   --   --  3.2  --   --   < > = values in this interval not displayed. Liver Function Tests:  Recent Labs Lab 11/02/15 0509  AST 20  ALT 29  ALKPHOS 80  BILITOT 1.0  PROT 5.8*  ALBUMIN 1.7*    Recent Labs Lab 10/31/15 0649  AMMONIA 18   CBC:  Recent Labs Lab 10/28/15 0443 10/29/15 0421 10/30/15 0431 10/31/15 0500 11/02/15 0509  WBC 26.2* 31.0* 29.4* 28.4* 33.5*  HGB 9.7* 9.4* 10.0* 9.9* 9.3*  HCT 29.9* 29.0* 30.8* 30.6* 29.1*  MCV 93.0 92.1 94.1 93.7 94.1  PLT 109* 132* 181 211 203   BNP (last 3 results)  Recent Labs  10/31/15 0649  BNP 268.0*     CBG:  Recent Labs Lab 11/02/15 0905 11/02/15 1004 11/02/15 1101 11/02/15 1210 11/02/15 1315  GLUCAP 135* 138* 153* 169* 182*    Recent Results (from the past 240 hour(s))  Urine culture     Status: None   Collection Time: 10/28/15  2:36 PM  Result Value Ref Range  Status   Specimen Description URINE, RANDOM  Final   Special Requests NONE  Final   Culture NO GROWTH 2 DAYS  Final   Report Status 10/30/2015 FINAL  Final     Studies: Dg Chest 1 View  11/01/2015  CLINICAL DATA:  70 year old female with acute shortness of Breath, found unresponsive at the end of January. Sepsis. Initial encounter. EXAM: CHEST 1 VIEW COMPARISON:  10/31/2015 and earlier. FINDINGS: Portable AP semi upright view at 1204 hours. Large body habitus. Bilateral IJ central lines appear stable. No endotracheal tube or enteric tube. Since 10/20/2015 bilateral small pleural effusions have developed but are stable to mildly improved since yesterday, and improved since 10/30/2015. No pneumothorax. Stable pulmonary vascularity without overt edema. Stable cardiac size and mediastinal contours. No air bronchograms identified to suggest consolidation. IMPRESSION: Bilateral pleural effusions appear small, and somewhat improved since 10/30/2015. Electronically Signed   By: Genevie Ann M.D.   On: 11/01/2015 13:48    Scheduled Meds: . antiseptic oral rinse  7 mL Mouth Rinse q12n4p  . ceFEPime (MAXIPIME) IV  2 g Intravenous 3 times per day  . chlorhexidine  15 mL Mouth Rinse BID  . heparin subcutaneous  5,000 Units Subcutaneous Q12H  . ipratropium-albuterol  3 mL Nebulization Q6H  . pantoprazole (PROTONIX) IV  40 mg Intravenous Q24H   Continuous Infusions: . dextrose 125 mL/hr at 11/02/15 0700  . insulin (NOVOLIN-R) infusion 2.1 Units/hr (11/02/15 0813)  . norepinephrine (LEVOPHED) Adult infusion Stopped (10/25/15 2259)  . vasopressin (PITRESSIN) infusion - *FOR SHOCK* Stopped (10/23/15 0950)    Assessment/Plan:  1. Septic shock due to Proteus bacteremia. Sputum culture also positive for Enterobacter pneumonia. Patient is on cefepime. Patient still spiking low-grade fevers and has gangrene on toes at this point. Off pressors at this point. I will get infectious disease consultation today.  Patient  still has leukocytosis. 2. Acute renal failure. This has improved likely due to ATN in the setting of sepsis and rhabdomyolysis. Patient was initially on CRRT and then HD and now off. Creatinine has improved.dialysis catheter removed yesterday. 3. Stridor- case discussed with critical  care specialist. Patient on humidified trach collar mask and nebulizers given. Seems, odd since this is a few days after extubation. 4. Atrial fibrillation with rapid ventricular response. Heart rate controlled. 5. Type 2 diabetes- patient to be switched to lantus and slinding scale. 6. Elevated liver function tests and jaundice- likely secondary shock liver. iver function tests have normalized. 7. Acute rhabdomyolysis- CPKs have trended better 8. 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 Platelet count has recovered. 9. Acute respiratory failure- patient now extubated. Patient now has stridor.May need intermittent BiPAP. 10. Severe hypernatremia- on D5W. Sodium still high today at 150 11. Acute encephalopathy. Patient now answering questions. Speech therapy it evaluated today to see if the patient is able to swallow. 12. Gangrene bilateral lower extremities. May end up needing amputations in the future. 13. Right hydronephrosis on previous sonogram.Repeat a sonogram and put in for urology consultation.  Code Status:     Code Status Orders        Start     Ordered   10/30/15 1511  Do not attempt resuscitation (DNR)   Continuous    Question Answer Comment  In the event of cardiac or respiratory ARREST Do not call a "code blue"   In the event of cardiac or respiratory ARREST Do not perform Intubation, CPR, defibrillation or ACLS   In the event of cardiac or respiratory ARREST Use medication by any route, position, wound care, and other measures to relive pain and suffering. May use oxygen, suction and manual treatment of airway obstruction as needed for comfort.   Comments DNR/ DNI       10/30/15 1510    Code Status History    Date Active Date Inactive Code Status Order ID Comments User Context   10/30/2015  2:18 PM 10/30/2015  3:10 PM DNR [id number]  Colleen Can, MD Inpatient   10/20/2015  5:14 PM 10/30/2015  2:18 PM Full Code [id number]  Fritzi Mandes, MD Inpatient     Disposition Plan: To be determined  Consultants:  Nephrology  Neurology  Critical care  Vascular surgery  Antibiotics:  Cefepime  Time spent: 25 minutes.   Loletha Grayer  [facility]

## 2015-11-02 NOTE — Consult Note (Signed)
WOC wound consult note Reason for Consult:Blistering to lower extremities, some skin tears to upper extremities. Right leg demarcated halfway up the lower leg. Gangrene all the toes and right foot. Left foot demarcated and left toes gangrenous.  Wound type:Ischemic wounds/trauma wounds to bilateral lower extremities.   Pressure Ulcer POA: Yes Pressure and trauma wounds with ischemia to bilateral lower extremities.  Measurement:3 cm x 2 cm intact serum filled blister to right anterior lower leg 4 cm x 3 cm x 0.1 ruptured serum filled blisters Wound TRR:NHAF and moist Gangrene to left and right toes and feet.  Drainage (amount, consistency, odor) Scant serous  No odor Periwound:Bruising, thin skin Dressing procedure/placement/frequency:Silicone border foam dressing to blistering in bilateral lower extremities.  Heels are offloaded with prevalon boot.  Is on Group 2 low air loss bed for pressure redistribution.  Will not follow at this time.  Please re-consult if needed.  Domenic Moras RN BSN [place] Pager [phone]

## 2015-11-02 NOTE — Progress Notes (Signed)
Repeat renal u/s shows continued right hydronephrosis. Discussed with Dr. Erlene Quan. She will add patient on tomorrow for cystoscopy, right retrograde pyelogram, right ureteral stent placement. Orders placed.

## 2015-11-02 NOTE — Progress Notes (Signed)
Subjective: Patient not on BiPAP this morning.  Awake.  Follows commands.    Objective: Current vital signs: BP 162/61 mmHg  Pulse 102  Temp(Src) 99.3 F (37.4 C) (Core (Comment))  Resp 23  Ht '5\' 7"'$  (1.702 m)  Wt 120.8 kg (266 lb 5.1 oz)  BMI 41.70 kg/m2  SpO2 97% Vital signs in last 24 hours: Temp:  [99.3 F (37.4 C)-99.9 F (37.7 C)] 99.3 F (37.4 C) (02/06 0900) Pulse Rate:  [86-103] 102 (02/06 0900) Resp:  [23-32] 23 (02/06 0900) BP: (125-162)/(43-79) 162/61 mmHg (02/06 0900) SpO2:  [93 %-99 %] 97 % (02/06 0900) FiO2 (%):  [28 %-35 %] 35 % (02/06 0400) Weight:  [120.8 kg (266 lb 5.1 oz)] 120.8 kg (266 lb 5.1 oz) (02/06 0400)  Intake/Output from previous day: 02/05 0701 - 02/06 0700 In: 3734.2 [I.V.:3034.2; IV Piggyback:700] Out: 2085 [Urine:2085] Intake/Output this shift:   Nutritional status: DIET - DYS 1 Room service appropriate?: Yes; Fluid consistency:: Honey Thick  Neurologic Exam: Mental Status: Alert. Follows simple commands. Attempts speech.  ? Left neglect  Cranial Nerves: II: Discs flat bilaterally; Visual fields grossly normal, pupils equal, round, reactive to light and accommodation III,IV, VI: ptosis not present, extra-ocular motions intact bilaterally V,VII: corneals intact bilaterally VIII: hearing normal bilaterally IX,X: gag reflex unable to be tested XI: bilateral shoulder shrug unable to be tested XII: midline tongue Motor: Patient moves all extremities to command.  Able to lift the RUE off bed but does not appear to be able to lift the left.  Moves both lower extremities but both unable to lift off bed.    Sensory: Responds to light touch in all extremities Deep Tendon Reflexes: 2+ in the upper extremities. Plantars: Right: muteLeft: mute Cerebellar: Unable to perform   Lab Results: Basic Metabolic Panel:  Recent Labs Lab 10/28/15 0443  10/30/15 0431  10/31/15 0649  10/31/15 1708 10/31/15 2124  11/01/15 0455 11/01/15 1300 11/02/15 0509  NA 152*  < > 157*  < >  --   --  155* 157* 156* 151* 150*  K 3.9  < > 3.7  < >  --   < > 3.2* 3.4* 3.3* 3.4* 3.5  CL 119*  < > 121*  < >  --   --  120* 122* 123* 121* 119*  CO2 29  < > 31  < >  --   --  '29 31 30 27 25  '$ GLUCOSE 209*  < > 171*  < >  --   --  279* 247* 144* 185* 184*  BUN 71*  < > 54*  < >  --   --  42* 40* 39* 34* 30*  CREATININE 1.60*  < > 1.33*  < >  --   --  1.27* 1.22* 1.09* 1.12* 0.98  CALCIUM 7.7*  < > 8.1*  < >  --   --  7.8* 7.8* 7.6* 7.6* 7.7*  MG 2.3  --  2.1  --  2.0  --   --   --  2.0  --   --   PHOS  --   --  3.3  --   --   --   --   --  3.2  --   --   < > = values in this interval not displayed.  Liver Function Tests:  Recent Labs Lab 11/02/15 0509  AST 20  ALT 29  ALKPHOS 80  BILITOT 1.0  PROT 5.8*  ALBUMIN 1.7*  No results for input(s): LIPASE, AMYLASE in the last 168 hours.  Recent Labs Lab 10/31/15 0649  AMMONIA 18    CBC:  Recent Labs Lab 10/28/15 0443 10/29/15 0421 10/30/15 0431 10/31/15 0500 11/02/15 0509  WBC 26.2* 31.0* 29.4* 28.4* 33.5*  HGB 9.7* 9.4* 10.0* 9.9* 9.3*  HCT 29.9* 29.0* 30.8* 30.6* 29.1*  MCV 93.0 92.1 94.1 93.7 94.1  PLT 109* 132* 181 211 203    Cardiac Enzymes: No results for input(s): CKTOTAL, CKMB, CKMBINDEX, TROPONINI in the last 168 hours.  Lipid Panel: No results for input(s): CHOL, TRIG, HDL, CHOLHDL, VLDL, LDLCALC in the last 168 hours.  CBG:  Recent Labs Lab 11/02/15 0510 11/02/15 0543 11/02/15 0702 11/02/15 0805 11/02/15 0905  GLUCAP 145* 143* 209* 162* 135*    Microbiology: Results for orders placed or performed during the hospital encounter of 10/20/15  Urine culture     Status: None   Collection Time: 10/20/15  9:57 AM  Result Value Ref Range Status   Specimen Description URINE, RANDOM  Final   Special Requests NONE  Final   Culture >=100,000 COLONIES/mL PROTEUS MIRABILIS  Final   Report Status 10/22/2015 FINAL  Final   Organism  ID, Bacteria PROTEUS MIRABILIS  Final      Susceptibility   Proteus mirabilis - MIC*    AMPICILLIN <=2 SENSITIVE Sensitive     GENTAMICIN <=1 SENSITIVE Sensitive     CEFTRIAXONE Value in next row Sensitive      SENSITIVE<=1    CIPROFLOXACIN Value in next row Sensitive      SENSITIVE<=0.25    IMIPENEM Value in next row Sensitive      SENSITIVE2    NITROFURANTOIN Value in next row Resistant      RESISTANT128    TRIMETH/SULFA Value in next row Sensitive      SENSITIVE<=20    PIP/TAZO Value in next row Sensitive      SENSITIVE<=4    AMPICILLIN/SULBACTAM Value in next row Sensitive      SENSITIVE<=2    * >=100,000 COLONIES/mL PROTEUS MIRABILIS  Culture, blood (routine x 2)     Status: None   Collection Time: 10/20/15  9:57 AM  Result Value Ref Range Status   Specimen Description BLOOD RIGHT WRIST  Final   Special Requests   Final    BOTTLES DRAWN AEROBIC AND ANAEROBIC ANA 1ML AER 4ML   Culture  Setup Time   Final    GRAM NEGATIVE RODS IN BOTH AEROBIC AND ANAEROBIC BOTTLES CRITICAL RESULT CALLED TO, READ BACK BY AND VERIFIED WITH: MATT MCBANE ON 10/21/15 AT 0005 [facility] CONFIRMED BY [place]    Culture   Final    PROTEUS MIRABILIS IN BOTH AEROBIC AND ANAEROBIC BOTTLES    Report Status 10/22/2015 FINAL  Final   Organism ID, Bacteria PROTEUS MIRABILIS  Final      Susceptibility   Proteus mirabilis - MIC*    AMPICILLIN/SULBACTAM Value in next row Sensitive      SENSITIVE<=2    PIP/TAZO Value in next row Sensitive      SENSITIVE<=4    CEFTRIAXONE Value in next row Sensitive      SENSITIVE<=1    IMIPENEM Value in next row Sensitive      SENSITIVE2    GENTAMICIN Value in next row Sensitive      SENSITIVE<=1    CIPROFLOXACIN Value in next row Sensitive      SENSITIVE<=0.25    * PROTEUS MIRABILIS  Culture, blood (routine x 2)  Status: None   Collection Time: 10/20/15  9:57 AM  Result Value Ref Range Status   Specimen Description BLOOD LEFT HAND  Final   Special Requests   Final     BOTTLES DRAWN AEROBIC AND ANAEROBIC AER 3ML ANA 2ML   Culture  Setup Time   Final    GRAM NEGATIVE RODS IN BOTH AEROBIC AND ANAEROBIC BOTTLES CRITICAL VALUE NOTED.  VALUE IS CONSISTENT WITH PREVIOUSLY REPORTED AND CALLED VALUE.    Culture   Final    PROTEUS MIRABILIS IN BOTH AEROBIC AND ANAEROBIC BOTTLES    Report Status 10/22/2015 FINAL  Final   Organism ID, Bacteria PROTEUS MIRABILIS  Final      Susceptibility   Proteus mirabilis - MIC*    AMPICILLIN Value in next row Sensitive      SENSITIVE<=2    PIP/TAZO Value in next row Sensitive      SENSITIVE<=4    CEFTAZIDIME Value in next row Sensitive      SENSITIVE<=1    CEFTRIAXONE Value in next row Sensitive      SENSITIVE<=1    CEFEPIME Value in next row Sensitive      SENSITIVE<=1    IMIPENEM Value in next row Sensitive      SENSITIVE2    GENTAMICIN Value in next row Sensitive      SENSITIVE<=1    CIPROFLOXACIN Value in next row Sensitive      SENSITIVE<=0.25    * PROTEUS MIRABILIS  Blood Culture ID Panel (Reflexed)     Status: Abnormal   Collection Time: 10/20/15  9:57 AM  Result Value Ref Range Status   Enterococcus species NOT DETECTED NOT DETECTED Final   Listeria monocytogenes NOT DETECTED NOT DETECTED Final   Staphylococcus species NOT DETECTED NOT DETECTED Final   Staphylococcus aureus NOT DETECTED NOT DETECTED Final   Streptococcus species NOT DETECTED NOT DETECTED Final   Streptococcus agalactiae NOT DETECTED NOT DETECTED Final   Streptococcus pneumoniae NOT DETECTED NOT DETECTED Final   Streptococcus pyogenes NOT DETECTED NOT DETECTED Final   Acinetobacter baumannii NOT DETECTED NOT DETECTED Final   Enterobacteriaceae species DETECTED (A) NOT DETECTED Final    Comment: CRITICAL RESULT CALLED TO, READ BACK BY AND VERIFIED WITH: MATT MCBANE ON 10/21/15 AT 0005 [place]    Enterobacter cloacae complex NOT DETECTED NOT DETECTED Final   Escherichia coli NOT DETECTED NOT DETECTED Final   Klebsiella oxytoca NOT DETECTED  NOT DETECTED Final   Klebsiella pneumoniae NOT DETECTED NOT DETECTED Final   Proteus species (A) NOT DETECTED Final    CRITICAL RESULT CALLED TO, READ BACK BY AND VERIFIED WITH:    Comment: MATT MCBANE ON 10/21/15 AT 0005 [place]   Serratia marcescens NOT DETECTED NOT DETECTED Final   Haemophilus influenzae NOT DETECTED NOT DETECTED Final   Neisseria meningitidis NOT DETECTED NOT DETECTED Final   Pseudomonas aeruginosa NOT DETECTED NOT DETECTED Final   Candida albicans NOT DETECTED NOT DETECTED Final   Candida glabrata NOT DETECTED NOT DETECTED Final   Candida krusei NOT DETECTED NOT DETECTED Final   Candida parapsilosis NOT DETECTED NOT DETECTED Final   Candida tropicalis NOT DETECTED NOT DETECTED Final   Carbapenem resistance NOT DETECTED NOT DETECTED Final   Methicillin resistance NOT DETECTED NOT DETECTED Final   Vancomycin resistance NOT DETECTED NOT DETECTED Final  MRSA PCR Screening     Status: None   Collection Time: 10/20/15  7:41 PM  Result Value Ref Range Status   MRSA by PCR NEGATIVE NEGATIVE  Final    Comment:        The GeneXpert MRSA Assay (FDA approved for NASAL specimens only), is one component of a comprehensive MRSA colonization surveillance program. It is not intended to diagnose MRSA infection nor to guide or monitor treatment for MRSA infections.   CULTURE, BLOOD (ROUTINE X 2) w Reflex to PCR ID Panel     Status: None   Collection Time: 10/22/15 11:39 AM  Result Value Ref Range Status   Specimen Description BLOOD LEFT HAND  Final   Special Requests BOTTLES DRAWN AEROBIC AND ANAEROBIC 1CC  Final   Culture NO GROWTH 5 DAYS  Final   Report Status 10/27/2015 FINAL  Final  Culture, expectorated sputum-assessment     Status: None   Collection Time: 10/22/15 12:00 PM  Result Value Ref Range Status   Specimen Description SPUTUM  Final   Special Requests Normal  Final   Sputum evaluation THIS SPECIMEN IS ACCEPTABLE FOR SPUTUM CULTURE  Final   Report Status  10/22/2015 FINAL  Final  Culture, respiratory (NON-Expectorated)     Status: None   Collection Time: 10/22/15 12:00 PM  Result Value Ref Range Status   Specimen Description SPUTUM  Final   Special Requests Normal Reflexed from [id number]  Final   Gram Stain   Final    FAIR SPECIMEN - 70-80% WBCS FEW WBC SEEN RARE YEAST RARE GRAM NEGATIVE RODS    Culture LIGHT GROWTH ENTEROBACTER AEROGENES  Final   Report Status 10/24/2015 FINAL  Final   Organism ID, Bacteria ENTEROBACTER AEROGENES  Final      Susceptibility   Enterobacter aerogenes - MIC*    CEFAZOLIN >=64 RESISTANT Resistant     CEFEPIME <=1 SENSITIVE Sensitive     CEFTAZIDIME <=1 SENSITIVE Sensitive     CEFTRIAXONE <=1 SENSITIVE Sensitive     CIPROFLOXACIN <=0.25 SENSITIVE Sensitive     GENTAMICIN <=1 SENSITIVE Sensitive     IMIPENEM 2 SENSITIVE Sensitive     TRIMETH/SULFA <=20 SENSITIVE Sensitive     PIP/TAZO <=4 SENSITIVE Sensitive     * LIGHT GROWTH ENTEROBACTER AEROGENES  Urine culture     Status: None   Collection Time: 10/22/15 12:45 PM  Result Value Ref Range Status   Specimen Description URINE, RANDOM  Final   Special Requests NONE  Final   Culture NO GROWTH 2 DAYS  Final   Report Status 10/24/2015 FINAL  Final  Urine culture     Status: None   Collection Time: 10/28/15  2:36 PM  Result Value Ref Range Status   Specimen Description URINE, RANDOM  Final   Special Requests NONE  Final   Culture NO GROWTH 2 DAYS  Final   Report Status 10/30/2015 FINAL  Final    Coagulation Studies: No results for input(s): LABPROT, INR in the last 72 hours.  Imaging: Dg Chest 1 View  11/01/2015  CLINICAL DATA:  70 year old female with acute shortness of Breath, found unresponsive at the end of January. Sepsis. Initial encounter. EXAM: CHEST 1 VIEW COMPARISON:  10/31/2015 and earlier. FINDINGS: Portable AP semi upright view at 1204 hours. Large body habitus. Bilateral IJ central lines appear stable. No endotracheal tube or enteric  tube. Since 10/20/2015 bilateral small pleural effusions have developed but are stable to mildly improved since yesterday, and improved since 10/30/2015. No pneumothorax. Stable pulmonary vascularity without overt edema. Stable cardiac size and mediastinal contours. No air bronchograms identified to suggest consolidation. IMPRESSION: Bilateral pleural effusions appear small, and somewhat improved since  10/30/2015. Electronically Signed   By: Genevie Ann M.D.   On: 11/01/2015 13:48    Medications:  I have reviewed the patient's current medications. Scheduled: . antiseptic oral rinse  7 mL Mouth Rinse q12n4p  . ceFEPime (MAXIPIME) IV  2 g Intravenous Q12H  . chlorhexidine  15 mL Mouth Rinse BID  . free water  200 mL Per Tube Q4H  . heparin subcutaneous  5,000 Units Subcutaneous Q12H  . ipratropium-albuterol  3 mL Nebulization Q6H  . pantoprazole (PROTONIX) IV  40 mg Intravenous Q24H    Assessment/Plan: Patient some improved from initial evaluation but remains significantly weak.  Questionable left sided deficits.    Recommendations: 1.  MRI of the brain without contrast once patient medically stable enough for procedure.     LOS: 13 days   Alexis Goodell, MD Neurology [phone] 11/02/2015  10:06 AM

## 2015-11-02 NOTE — Progress Notes (Signed)
[facility] NOTE  Pharmacy Consult for Cefepime Dosing, Electrolyte Management, Constipation Prevention Indication: HD/ Proteus Bacteremia     Allergies  Allergen Reactions  . Prednisone Anaphylaxis    Patient Measurements: Height: '5\' 7"'$  (170.2 cm) Weight: 266 lb 5.1 oz (120.8 kg) IBW/kg (Calculated) : 61.6   Vital Signs: Temp: 99.5 F (37.5 C) (02/06 0500) Temp Source: Core (Comment) (02/06 0400) BP: 141/57 mmHg (02/06 0500) Pulse Rate: 89 (02/06 0500) Intake/Output from previous day: 02/05 0701 - 02/06 0700 In: 3483.3 [I.V.:2783.3; IV Piggyback:700] Out: 2035 [Urine:2035] Intake/Output from this shift: Total I/O In: 1314.3 [I.V.:1264.3; IV Piggyback:50] Out: 595 [Urine:595] Vent settings for last 24 hours: Vent Mode:  [-]  FiO2 (%):  [28 %-35 %] 35 %  Labs:  Recent Labs  10/31/15 0500 10/31/15 0649  11/01/15 0455 11/01/15 1300 11/02/15 0509  WBC 28.4*  --   --   --   --  33.5*  HGB 9.9*  --   --   --   --  9.3*  HCT 30.6*  --   --   --   --  29.1*  PLT 211  --   --   --   --  203  CREATININE 1.25*  --   < > 1.09* 1.12* 0.98  MG  --  2.0  --  2.0  --   --   PHOS  --   --   --  3.2  --   --   ALBUMIN  --   --   --   --   --  1.7*  PROT  --   --   --   --   --  5.8*  AST  --   --   --   --   --  20  ALT  --   --   --   --   --  29  ALKPHOS  --   --   --   --   --  80  BILITOT  --   --   --   --   --  1.0  < > = values in this interval not displayed. Estimated Creatinine Clearance: 73 mL/min (by C-G formula based on Cr of 0.98).   Recent Labs  11/02/15 0404 11/02/15 0510 11/02/15 0543  GLUCAP 139* 145* 143*    Microbiology: Recent Results (from the past 720 hour(s))  Urine culture     Status: None   Collection Time: 10/20/15  9:57 AM  Result Value Ref Range Status   Specimen Description URINE, RANDOM  Final   Special Requests NONE  Final   Culture >=100,000 COLONIES/mL PROTEUS MIRABILIS  Final   Report Status 10/22/2015  FINAL  Final   Organism ID, Bacteria PROTEUS MIRABILIS  Final      Susceptibility   Proteus mirabilis - MIC*    AMPICILLIN <=2 SENSITIVE Sensitive     GENTAMICIN <=1 SENSITIVE Sensitive     CEFTRIAXONE Value in next row Sensitive      SENSITIVE<=1    CIPROFLOXACIN Value in next row Sensitive      SENSITIVE<=0.25    IMIPENEM Value in next row Sensitive      SENSITIVE2    NITROFURANTOIN Value in next row Resistant      RESISTANT128    TRIMETH/SULFA Value in next row Sensitive      SENSITIVE<=20    PIP/TAZO Value in next row Sensitive      SENSITIVE<=4  AMPICILLIN/SULBACTAM Value in next row Sensitive      SENSITIVE<=2    * >=100,000 COLONIES/mL PROTEUS MIRABILIS  Culture, blood (routine x 2)     Status: None   Collection Time: 10/20/15  9:57 AM  Result Value Ref Range Status   Specimen Description BLOOD RIGHT WRIST  Final   Special Requests   Final    BOTTLES DRAWN AEROBIC AND ANAEROBIC ANA 1ML AER 4ML   Culture  Setup Time   Final    GRAM NEGATIVE RODS IN BOTH AEROBIC AND ANAEROBIC BOTTLES CRITICAL RESULT CALLED TO, READ BACK BY AND VERIFIED WITH: MATT [place] ON 10/21/15 AT 0005 [facility] CONFIRMED BY [place]    Culture   Final    PROTEUS MIRABILIS IN BOTH AEROBIC AND ANAEROBIC BOTTLES    Report Status 10/22/2015 FINAL  Final   Organism ID, Bacteria PROTEUS MIRABILIS  Final      Susceptibility   Proteus mirabilis - MIC*    AMPICILLIN/SULBACTAM Value in next row Sensitive      SENSITIVE<=2    PIP/TAZO Value in next row Sensitive      SENSITIVE<=4    CEFTRIAXONE Value in next row Sensitive      SENSITIVE<=1    IMIPENEM Value in next row Sensitive      SENSITIVE2    GENTAMICIN Value in next row Sensitive      SENSITIVE<=1    CIPROFLOXACIN Value in next row Sensitive      SENSITIVE<=0.25    * PROTEUS MIRABILIS  Culture, blood (routine x 2)     Status: None   Collection Time: 10/20/15  9:57 AM  Result Value Ref Range Status   Specimen Description BLOOD LEFT HAND  Final    Special Requests   Final    BOTTLES DRAWN AEROBIC AND ANAEROBIC AER 3ML ANA 2ML   Culture  Setup Time   Final    GRAM NEGATIVE RODS IN BOTH AEROBIC AND ANAEROBIC BOTTLES CRITICAL VALUE NOTED.  VALUE IS CONSISTENT WITH PREVIOUSLY REPORTED AND CALLED VALUE.    Culture   Final    PROTEUS MIRABILIS IN BOTH AEROBIC AND ANAEROBIC BOTTLES    Report Status 10/22/2015 FINAL  Final   Organism ID, Bacteria PROTEUS MIRABILIS  Final      Susceptibility   Proteus mirabilis - MIC*    AMPICILLIN Value in next row Sensitive      SENSITIVE<=2    PIP/TAZO Value in next row Sensitive      SENSITIVE<=4    CEFTAZIDIME Value in next row Sensitive      SENSITIVE<=1    CEFTRIAXONE Value in next row Sensitive      SENSITIVE<=1    CEFEPIME Value in next row Sensitive      SENSITIVE<=1    IMIPENEM Value in next row Sensitive      SENSITIVE2    GENTAMICIN Value in next row Sensitive      SENSITIVE<=1    CIPROFLOXACIN Value in next row Sensitive      SENSITIVE<=0.25    * PROTEUS MIRABILIS  Blood Culture ID Panel (Reflexed)     Status: Abnormal   Collection Time: 10/20/15  9:57 AM  Result Value Ref Range Status   Enterococcus species NOT DETECTED NOT DETECTED Final   Listeria monocytogenes NOT DETECTED NOT DETECTED Final   Staphylococcus species NOT DETECTED NOT DETECTED Final   Staphylococcus aureus NOT DETECTED NOT DETECTED Final   Streptococcus species NOT DETECTED NOT DETECTED Final   Streptococcus agalactiae NOT DETECTED NOT  DETECTED Final   Streptococcus pneumoniae NOT DETECTED NOT DETECTED Final   Streptococcus pyogenes NOT DETECTED NOT DETECTED Final   Acinetobacter baumannii NOT DETECTED NOT DETECTED Final   Enterobacteriaceae species DETECTED (A) NOT DETECTED Final    Comment: CRITICAL RESULT CALLED TO, READ BACK BY AND VERIFIED WITH: MATT MCBANE ON 10/21/15 AT 0005 [place]    Enterobacter cloacae complex NOT DETECTED NOT DETECTED Final   Escherichia coli NOT DETECTED NOT DETECTED Final    Klebsiella oxytoca NOT DETECTED NOT DETECTED Final   Klebsiella pneumoniae NOT DETECTED NOT DETECTED Final   Proteus species (A) NOT DETECTED Final    CRITICAL RESULT CALLED TO, READ BACK BY AND VERIFIED WITH:    Comment: MATT MCBANE ON 10/21/15 AT 0005 [place]   Serratia marcescens NOT DETECTED NOT DETECTED Final   Haemophilus influenzae NOT DETECTED NOT DETECTED Final   Neisseria meningitidis NOT DETECTED NOT DETECTED Final   Pseudomonas aeruginosa NOT DETECTED NOT DETECTED Final   Candida albicans NOT DETECTED NOT DETECTED Final   Candida glabrata NOT DETECTED NOT DETECTED Final   Candida krusei NOT DETECTED NOT DETECTED Final   Candida parapsilosis NOT DETECTED NOT DETECTED Final   Candida tropicalis NOT DETECTED NOT DETECTED Final   Carbapenem resistance NOT DETECTED NOT DETECTED Final   Methicillin resistance NOT DETECTED NOT DETECTED Final   Vancomycin resistance NOT DETECTED NOT DETECTED Final  MRSA PCR Screening     Status: None   Collection Time: 10/20/15  7:41 PM  Result Value Ref Range Status   MRSA by PCR NEGATIVE NEGATIVE Final    Comment:        The GeneXpert MRSA Assay (FDA approved for NASAL specimens only), is one component of a comprehensive MRSA colonization surveillance program. It is not intended to diagnose MRSA infection nor to guide or monitor treatment for MRSA infections.   CULTURE, BLOOD (ROUTINE X 2) w Reflex to PCR ID Panel     Status: None   Collection Time: 10/22/15 11:39 AM  Result Value Ref Range Status   Specimen Description BLOOD LEFT HAND  Final   Special Requests BOTTLES DRAWN AEROBIC AND ANAEROBIC 1CC  Final   Culture NO GROWTH 5 DAYS  Final   Report Status 10/27/2015 FINAL  Final  Culture, expectorated sputum-assessment     Status: None   Collection Time: 10/22/15 12:00 PM  Result Value Ref Range Status   Specimen Description SPUTUM  Final   Special Requests Normal  Final   Sputum evaluation THIS SPECIMEN IS ACCEPTABLE FOR SPUTUM CULTURE   Final   Report Status 10/22/2015 FINAL  Final  Culture, respiratory (NON-Expectorated)     Status: None   Collection Time: 10/22/15 12:00 PM  Result Value Ref Range Status   Specimen Description SPUTUM  Final   Special Requests Normal Reflexed from [id number]  Final   Gram Stain   Final    FAIR SPECIMEN - 70-80% WBCS FEW WBC SEEN RARE YEAST RARE GRAM NEGATIVE RODS    Culture LIGHT GROWTH ENTEROBACTER AEROGENES  Final   Report Status 10/24/2015 FINAL  Final   Organism ID, Bacteria ENTEROBACTER AEROGENES  Final      Susceptibility   Enterobacter aerogenes - MIC*    CEFAZOLIN >=64 RESISTANT Resistant     CEFEPIME <=1 SENSITIVE Sensitive     CEFTAZIDIME <=1 SENSITIVE Sensitive     CEFTRIAXONE <=1 SENSITIVE Sensitive     CIPROFLOXACIN <=0.25 SENSITIVE Sensitive     GENTAMICIN <=1 SENSITIVE Sensitive  IMIPENEM 2 SENSITIVE Sensitive     TRIMETH/SULFA <=20 SENSITIVE Sensitive     PIP/TAZO <=4 SENSITIVE Sensitive     * LIGHT GROWTH ENTEROBACTER AEROGENES  Urine culture     Status: None   Collection Time: 10/22/15 12:45 PM  Result Value Ref Range Status   Specimen Description URINE, RANDOM  Final   Special Requests NONE  Final   Culture NO GROWTH 2 DAYS  Final   Report Status 10/24/2015 FINAL  Final  Urine culture     Status: None   Collection Time: 10/28/15  2:36 PM  Result Value Ref Range Status   Specimen Description URINE, RANDOM  Final   Special Requests NONE  Final   Culture NO GROWTH 2 DAYS  Final   Report Status 10/30/2015 FINAL  Final    Medications:  Scheduled:  . antiseptic oral rinse  7 mL Mouth Rinse q12n4p  . ceFEPime (MAXIPIME) IV  2 g Intravenous Q12H  . chlorhexidine  15 mL Mouth Rinse BID  . free water  200 mL Per Tube Q4H  . heparin subcutaneous  5,000 Units Subcutaneous Q12H  . ipratropium-albuterol  3 mL Nebulization Q6H  . pantoprazole (PROTONIX) IV  40 mg Intravenous Q24H   Infusions:  . dextrose 125 mL/hr at 11/02/15 0500  . insulin (NOVOLIN-R)  infusion 0.9 Units/hr (11/02/15 0514)  . norepinephrine (LEVOPHED) Adult infusion Stopped (10/25/15 2259)  . vasopressin (PITRESSIN) infusion - *FOR SHOCK* Stopped (10/23/15 4403)    Assessment: 69 y/o F with septic shock from Proteus UTI. Patient with ARF recently transitioned from CRRT to HD not currently requiring HD.    Plan:  K 3.5, Ca 7.7, adjusted Ca 9.5. No additional supplementation ordered. Will recheck with AM labs.  Luisfernando Brightwell A. [place], [place].D., BCPS Clinical Pharmacist   11/02/2015

## 2015-11-02 NOTE — Progress Notes (Signed)
Patient stated that she was not told about the procedure for tomorrow-cyctoscopy- therefore she did not want to sign consent until the MD talked to her about it.

## 2015-11-03 ENCOUNTER — Encounter
Admission: EM | Disposition: A | Discharge status: Skilled Nursing Facility | Payer: Self-pay | Source: Home / Self Care | Attending: Specialist

## 2015-11-03 ENCOUNTER — Inpatient Hospital Stay: Payer: Medicare HMO | Admitting: Anesthesiology

## 2015-11-03 ENCOUNTER — Encounter: Payer: Self-pay | Admitting: Anesthesiology

## 2015-11-03 DIAGNOSIS — E119 Type 2 diabetes mellitus without complications: Secondary | ICD-10-CM

## 2015-11-03 DIAGNOSIS — N131 Hydronephrosis with ureteral stricture, not elsewhere classified: Secondary | ICD-10-CM

## 2015-11-03 DIAGNOSIS — E870 Hyperosmolality and hypernatremia: Secondary | ICD-10-CM

## 2015-11-03 DIAGNOSIS — R601 Generalized edema: Secondary | ICD-10-CM

## 2015-11-03 DIAGNOSIS — E669 Obesity, unspecified: Secondary | ICD-10-CM

## 2015-11-03 DIAGNOSIS — G9340 Encephalopathy, unspecified: Secondary | ICD-10-CM

## 2015-11-03 HISTORY — PX: CYSTOSCOPY WITH STENT PLACEMENT: SHX5790

## 2015-11-03 LAB — GLUCOSE, CAPILLARY
GLUCOSE-CAPILLARY: 218 mg/dL — AB (ref 65–99)
GLUCOSE-CAPILLARY: 171 mg/dL — AB (ref 65–99)
Glucose-Capillary: 183 mg/dL — ABNORMAL HIGH (ref 65–99)
Glucose-Capillary: 230 mg/dL — ABNORMAL HIGH (ref 65–99)
Glucose-Capillary: 155 mg/dL — ABNORMAL HIGH (ref 65–99)

## 2015-11-03 LAB — COMPREHENSIVE METABOLIC PANEL
ALBUMIN: 1.6 g/dL — AB (ref 3.5–5.0)
ALT: 26 U/L (ref 14–54)
AST: 20 U/L (ref 15–41)
Alkaline Phosphatase: 87 U/L (ref 38–126)
Anion gap: 3 — ABNORMAL LOW (ref 5–15)
BILIRUBIN TOTAL: 0.7 mg/dL (ref 0.3–1.2)
BUN: 28 mg/dL — AB (ref 6–20)
CHLORIDE: 114 mmol/L — AB (ref 101–111)
CO2: 29 mmol/L (ref 22–32)
Calcium: 7.5 mg/dL — ABNORMAL LOW (ref 8.9–10.3)
Creatinine, Ser: 1.06 mg/dL — ABNORMAL HIGH (ref 0.44–1.00)
GFR calc Af Amer: >60 mL/min (ref >60)
GFR calc non Af Amer: 52 mL/min — ABNORMAL LOW (ref >60)
GLUCOSE: 255 mg/dL — AB (ref 65–99)
POTASSIUM: 2.8 mmol/L — AB (ref 3.5–5.1)
SODIUM: 146 mmol/L — AB (ref 135–145)
Total Protein: 5.4 g/dL — ABNORMAL LOW (ref 6.5–8.1)

## 2015-11-03 LAB — POTASSIUM
Potassium: 3.3 mmol/L — ABNORMAL LOW (ref 3.5–5.1)
Potassium: 3.4 mmol/L — ABNORMAL LOW (ref 3.5–5.1)

## 2015-11-03 LAB — MAGNESIUM: MAGNESIUM: 1.8 mg/dL (ref 1.7–2.4)

## 2015-11-03 LAB — PHOSPHORUS: PHOSPHORUS: 3.6 mg/dL (ref 2.5–4.6)

## 2015-11-03 SURGERY — CYSTOSCOPY, WITH STENT INSERTION
Anesthesia: General | Laterality: Right | Wound class: Clean Contaminated

## 2015-11-03 MED ORDER — POTASSIUM CHLORIDE 10 MEQ/100ML IV SOLN
10.0000 meq | INTRAVENOUS | Status: AC
  Administered 2015-11-03 (×6): 10 meq via INTRAVENOUS
  Filled 2015-11-03 (×6): qty 100

## 2015-11-03 MED ORDER — POTASSIUM CHLORIDE 10 MEQ/100ML IV SOLN
10.0000 meq | INTRAVENOUS | Status: DC
  Filled 2015-11-03 (×3): qty 100

## 2015-11-03 MED ORDER — FENTANYL CITRATE (PF) 100 MCG/2ML IJ SOLN
INTRAMUSCULAR | Status: DC | PRN
  Administered 2015-11-03 (×2): 25 ug via INTRAVENOUS

## 2015-11-03 MED ORDER — DEXMEDETOMIDINE HCL 200 MCG/2ML IV SOLN
INTRAVENOUS | Status: DC | PRN
  Administered 2015-11-03 (×5): 4 ug via INTRAVENOUS

## 2015-11-03 MED ORDER — MAGNESIUM SULFATE 2 GM/50ML IV SOLN
2.0000 g | Freq: Once | INTRAVENOUS | Status: AC
  Administered 2015-11-03: 2 g via INTRAVENOUS
  Filled 2015-11-03: qty 50

## 2015-11-03 MED ORDER — PROPOFOL 10 MG/ML IV BOLUS
INTRAVENOUS | Status: DC | PRN
  Administered 2015-11-03 (×5): 10 mg via INTRAVENOUS

## 2015-11-03 MED ORDER — FENTANYL CITRATE (PF) 100 MCG/2ML IJ SOLN: 25.0000 ug | INTRAMUSCULAR | Status: DC | PRN

## 2015-11-03 MED ORDER — ONDANSETRON HCL 4 MG/2ML IJ SOLN: 4.0000 mg | Freq: Once | INTRAMUSCULAR | Status: DC | PRN

## 2015-11-03 MED ORDER — LACTATED RINGERS IV SOLN
INTRAVENOUS | Status: DC | PRN
  Administered 2015-11-03: 14:00:00 via INTRAVENOUS

## 2015-11-03 SURGICAL SUPPLY — 21 items
BAG DRAIN CYSTO-URO LG1000N (MISCELLANEOUS) ×3 IMPLANT
CATH URETL 5X70 OPEN END (CATHETERS) ×3 IMPLANT
CONRAY 43 FOR UROLOGY 50M (MISCELLANEOUS) ×3 IMPLANT
GLOVE BIO SURGEON STRL SZ 6.5 (GLOVE) ×2 IMPLANT
GLOVE BIO SURGEON STRL SZ7 (GLOVE) ×6 IMPLANT
GLOVE BIO SURGEONS STRL SZ 6.5 (GLOVE) ×1
GOWN STRL REUS W/ TWL LRG LVL4 (GOWN DISPOSABLE) ×2 IMPLANT
GOWN STRL REUS W/TWL LRG LVL4 (GOWN DISPOSABLE) ×4
KIT RM TURNOVER CYSTO AR (KITS) ×3 IMPLANT
PACK CYSTO AR (MISCELLANEOUS) ×3 IMPLANT
PREP PVP WINGED SPONGE (MISCELLANEOUS) ×3 IMPLANT
SENSORWIRE 0.038 NOT ANGLED (WIRE) ×6
SET CYSTO W/LG BORE CLAMP LF (SET/KITS/TRAYS/PACK) ×3 IMPLANT
SOL .9 NS 3000ML IRR  AL (IV SOLUTION) ×2
SOL .9 NS 3000ML IRR UROMATIC (IV SOLUTION) ×1 IMPLANT
STENT URET 6FRX24 CONTOUR (STENTS) IMPLANT
STENT URET 6FRX26 CONTOUR (STENTS) IMPLANT
SURGILUBE 2OZ TUBE FLIPTOP (MISCELLANEOUS) ×3 IMPLANT
SYRINGE IRR TOOMEY STRL 70CC (SYRINGE) ×3 IMPLANT
WATER STERILE IRR 1000ML POUR (IV SOLUTION) ×3 IMPLANT
WIRE SENSOR 0.038 NOT ANGLED (WIRE) ×2 IMPLANT

## 2015-11-03 NOTE — Transfer of Care (Signed)
Immediate Anesthesia Transfer of Care Note  Patient: April [name]  Procedure(s) Performed: Procedure(s): CYSTOSCOPY WITH STENT PLACEMENT (Right)  Patient Location: PACU  Anesthesia Type:General  Level of Consciousness: awake  Airway & Oxygen Therapy: Patient connected to nasal cannula oxygen  Post-op Assessment: Report given to RN  Post vital signs: stable  Last Vitals:  Filed Vitals:   11/03/15 1200 11/03/15 1530  BP: 129/49   Pulse: 81   Temp: 36.9 C 36.7 C  Resp: 24     Complications: No apparent anesthesia complications  History reviewed. No pertinent past medical history. History reviewed. No pertinent past surgical history. Scheduled Meds: . [facility] Hold] antiseptic oral rinse  7 mL Mouth Rinse q12n4p  . [MAR Hold] ceFEPime (MAXIPIME) IV  2 g Intravenous 3 times per day  . [MAR Hold] chlorhexidine  15 mL Mouth Rinse BID  . [MAR Hold] heparin subcutaneous  5,000 Units Subcutaneous Q12H  . [MAR Hold] insulin aspart  0-5 Units Subcutaneous QHS  . [MAR Hold] insulin aspart  0-9 Units Subcutaneous TID WC  . [MAR Hold] insulin glargine  15 Units Subcutaneous Daily  . [MAR Hold] ipratropium-albuterol  3 mL Nebulization Q6H  . potassium chloride  10 mEq Intravenous Q1 Hr x 6  . [MAR Hold] potassium chloride  10 mEq Intravenous Q1 Hr x 3   Continuous Infusions: . dextrose 75 mL/hr at 11/03/15 1100   PRN Meds:.[DISCONTINUED] acetaminophen **OR** [MAR Hold] acetaminophen, [MAR Hold] bisacodyl, fentaNYL (SUBLIMAZE) injection, [MAR Hold] iohexol, [MAR Hold]  morphine injection, [MAR Hold] ondansetron (ZOFRAN) IV, ondansetron (ZOFRAN) IV, [MAR Hold] prochlorperazine, [MAR Hold] sodium chloride flush

## 2015-11-03 NOTE — Anesthesia Postprocedure Evaluation (Signed)
Anesthesia Post Note  Patient: April [name]  Procedure(s) Performed: Procedure(s) (LRB): CYSTOSCOPY WITH STENT PLACEMENT (Right)  Patient location during evaluation: PACU Anesthesia Type: MAC Level of consciousness: awake and alert Pain management: pain level controlled Vital Signs Assessment: post-procedure vital signs reviewed and stable Respiratory status: spontaneous breathing, nonlabored ventilation, respiratory function stable and patient connected to nasal cannula oxygen Cardiovascular status: blood pressure returned to baseline and stable Postop Assessment: no signs of nausea or vomiting Anesthetic complications: no    Last Vitals:  Filed Vitals:   11/03/15 1545 11/03/15 1600  BP: 142/56   Pulse: 80   Temp:  36.8 C  Resp: 24     Last Pain:  Filed Vitals:   11/03/15 1601  PainSc: 0-No pain                 Precious Haws [place]

## 2015-11-03 NOTE — Progress Notes (Signed)
Palliative Medicine Inpatient Consult Follow Up Note   Name: April [name] Date: 11/03/2015 MRN: [id number]  DOB: Jun 18, 1946  Referring Physician: Loletha Grayer, MD  Palliative Care consult requested for this 70 y.o. female for goals of medical therapy in patient with sepsis and complications related to sepsis.     Updated History:  Pt was found down after not showing up for work for two days. She was intubated and treated for sepsis with Proteus which has grown in blood cxs. She had acute renal failure and was on CRRT and also HD.  Her kidney function has recovered. She is off of pressors. She came in with blue-black gangrenous toes and it is thought that her distal legs suffered from ischemia prior to her arrival here. She was on pressors for a short period of time.  She had hydronephrosis identified and has had continued fevers and elevated white blood cell counts.  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  The Op note states that the procedure was difficult due to pt's body habitus (she is obese) and also due to extreme anasarca.  But, ultimately, a successful stent was placed in the right ureter.  She has a Foley catheter. She was noted to have some VAGINAL BLEEDING during the prep process before the surgery.   She has had complications including newly diagnosed DM2, elevated LFTs related to shock liver (now normalized), rhabdomyolysis, thrombocytopenia, severe hypernatremia, and problems with electrolytes.  She will need amputation of some parts of her lower extremeties (either autoamputation of toes and /or higher level surgical amputation depending on viability of lower limbs)  She continues to have acute encephalopathy.  She is now extubated and will answer some questions with simple yes or no answers, but per Dr. Earleen Newport, she is not clearly answering appropriately.  Of course, now, after  anesthesia, is not the time to ask more questions.    PLAN:  I will continue to follow and will at some point address code status with appropriate decision maker. The biggest palliative care matter for this pt is to clarify proper decision maker  (see my previous notes about the confusion over this matter).  She is full code for now.  She may need to go to an LTAC but will require some further management here --probably another week first (per report to me from Dr. Earleen Newport).      REVIEW OF SYSTEMS:  Patient is not able to provide ROS --sedated  CODE STATUS: Full code   PAST MEDICAL HISTORY:History reviewed. No pertinent past medical history.  PAST SURGICAL HISTORY:  Past Surgical History  Procedure Laterality Date  . Cystoscopy with stent placement Right 11/03/2015    Procedure: CYSTOSCOPY WITH STENT PLACEMENT;  Surgeon: Hollice Espy, MD;  Location: [facility] ORS;  Service: Urology;  Laterality: Right;    Vital Signs: BP 142/56 mmHg  Pulse 80  Temp(Src) 98.2 F (36.8 C) (Core (Comment))  Resp 24  Ht '5\' 7"'$  (1.702 m)  Wt 119.8 kg (264 lb 1.8 oz)  BMI 41.36 kg/m2  SpO2 99% Filed Weights   11/01/15 0500 11/02/15 0400 11/03/15 0500  Weight: 121 kg (266 lb 12.1 oz) 120.8 kg (266 lb 5.1 oz) 119.8 kg (264 lb 1.8 oz)    Estimated body mass index is 41.36 kg/(m^2) as calculated from the following:   Height as of this encounter: '5\' 7"'$  (1.702 m).   Weight as of this encounter:  119.8 kg (264 lb 1.8 oz).  PHYSICAL EXAM: She is sleeping --following cystoscopy with stent placement which took place this afternoon.   NAD Eyes closed No stridor heard today during my visit Hrt rrr no mgr Lungs  decreased BS in bases Abd soft and NT Ext toes unchanged --still black, boots in place    LABS: CBC:    Component Value Date/Time   WBC 33.5* 11/02/2015 0509   HGB 9.3* 11/02/2015 0509   HCT 29.1* 11/02/2015 0509   PLT 203 11/02/2015 0509   MCV 94.1 11/02/2015 0509   NEUTROABS 47.0*  10/20/2015 0957   LYMPHSABS 2.0 10/20/2015 0957   MONOABS 1.5* 10/20/2015 0957   EOSABS 0.0 10/20/2015 0957   BASOSABS 0.0 10/20/2015 0957   Comprehensive Metabolic Panel:    Component Value Date/Time   NA 146* 11/03/2015 0539   K 3.3* 11/03/2015 1245   CL 114* 11/03/2015 0539   CO2 29 11/03/2015 0539   BUN 28* 11/03/2015 0539   CREATININE 1.06* 11/03/2015 0539   GLUCOSE 255* 11/03/2015 0539   CALCIUM 7.5* 11/03/2015 0539   AST 20 11/03/2015 0539   ALT 26 11/03/2015 0539   ALKPHOS 87 11/03/2015 0539   BILITOT 0.7 11/03/2015 0539   PROT 5.4* 11/03/2015 0539   ALBUMIN 1.6* 11/03/2015 0539     More than 50% of the visit was spent in counseling/coordination of care: YES  Time Spent: 15 min

## 2015-11-03 NOTE — Progress Notes (Signed)
PCCM PROGRESS NOTE  Less dyspneic today compared to yesterday. No new complaints. + F/C  Filed Vitals:   11/03/15 0900 11/03/15 1000 11/03/15 1100 11/03/15 1200  BP: 136/58  124/54 129/49  Pulse: 84 85 77 81  Temp: 98.4 F (36.9 C) 98.6 F (37 C) 98.2 F (36.8 C) 98.4 F (36.9 C)  TempSrc:      Resp: '26  22 24  '$ Height:      Weight:      SpO2: 97% 99% 98% 99%  2 lpm [place]  NAD HEENT WNL. No stridor JVP not well visualized Coarse BS, no wheezes Obese, soft, NT, +BS B gangrenous feet  BMP Latest Ref Rng 11/03/2015 11/02/2015 11/01/2015  Glucose 65 - 99 mg/dL 255(H) 184(H) 185(H)  BUN 6 - 20 mg/dL 28(H) 30(H) 34(H)  Creatinine 0.44 - 1.00 mg/dL 1.06(H) 0.98 1.12(H)  Sodium 135 - 145 mmol/L 146(H) 150(H) 151(H)  Potassium 3.5 - 5.1 mmol/L 2.8(LL) 3.5 3.4(L)  Chloride 101 - 111 mmol/L 114(H) 119(H) 121(H)  CO2 22 - 32 mmol/L '29 25 27  '$ Calcium 8.9 - 10.3 mg/dL 7.5(L) 7.7(L) 7.6(L)    CBC Latest Ref Rng 11/02/2015 10/31/2015 10/30/2015  WBC 3.6 - 11.0 K/uL 33.5(H) 28.4(H) 29.4(H)  Hemoglobin 12.0 - 16.0 g/dL 9.3(L) 9.9(L) 10.0(L)  Hematocrit 35.0 - 47.0 % 29.1(L) 30.6(L) 30.8(L)  Platelets 150 - 440 K/uL 203 211 181    CXR: no new film  Results for orders placed or performed during the hospital encounter of 10/20/15  Urine culture     Status: None   Collection Time: 10/20/15  9:57 AM  Result Value Ref Range Status   Specimen Description URINE, RANDOM  Final   Special Requests NONE  Final   Culture >=100,000 COLONIES/mL PROTEUS MIRABILIS  Final   Report Status 10/22/2015 FINAL  Final   Organism ID, Bacteria PROTEUS MIRABILIS  Final      Susceptibility   Proteus mirabilis - MIC*    AMPICILLIN <=2 SENSITIVE Sensitive     GENTAMICIN <=1 SENSITIVE Sensitive     CEFTRIAXONE Value in next row Sensitive      SENSITIVE<=1    CIPROFLOXACIN Value in next row Sensitive      SENSITIVE<=0.25    IMIPENEM Value in next row Sensitive      SENSITIVE2    NITROFURANTOIN Value in next row  Resistant      RESISTANT128    TRIMETH/SULFA Value in next row Sensitive      SENSITIVE<=20    PIP/TAZO Value in next row Sensitive      SENSITIVE<=4    AMPICILLIN/SULBACTAM Value in next row Sensitive      SENSITIVE<=2    * >=100,000 COLONIES/mL PROTEUS MIRABILIS  Culture, blood (routine x 2)     Status: None   Collection Time: 10/20/15  9:57 AM  Result Value Ref Range Status   Specimen Description BLOOD RIGHT WRIST  Final   Special Requests   Final    BOTTLES DRAWN AEROBIC AND ANAEROBIC ANA 1ML AER 4ML   Culture  Setup Time   Final    GRAM NEGATIVE RODS IN BOTH AEROBIC AND ANAEROBIC BOTTLES CRITICAL RESULT CALLED TO, READ BACK BY AND VERIFIED WITH: MATT MCBANE ON 10/21/15 AT 0005 [facility] CONFIRMED BY [place]    Culture   Final    PROTEUS MIRABILIS IN BOTH AEROBIC AND ANAEROBIC BOTTLES    Report Status 10/22/2015 FINAL  Final   Organism ID, Bacteria PROTEUS MIRABILIS  Final  Susceptibility   Proteus mirabilis - MIC*    AMPICILLIN/SULBACTAM Value in next row Sensitive      SENSITIVE<=2    PIP/TAZO Value in next row Sensitive      SENSITIVE<=4    CEFTRIAXONE Value in next row Sensitive      SENSITIVE<=1    IMIPENEM Value in next row Sensitive      SENSITIVE2    GENTAMICIN Value in next row Sensitive      SENSITIVE<=1    CIPROFLOXACIN Value in next row Sensitive      SENSITIVE<=0.25    * PROTEUS MIRABILIS  Culture, blood (routine x 2)     Status: None   Collection Time: 10/20/15  9:57 AM  Result Value Ref Range Status   Specimen Description BLOOD LEFT HAND  Final   Special Requests   Final    BOTTLES DRAWN AEROBIC AND ANAEROBIC AER 3ML ANA 2ML   Culture  Setup Time   Final    GRAM NEGATIVE RODS IN BOTH AEROBIC AND ANAEROBIC BOTTLES CRITICAL VALUE NOTED.  VALUE IS CONSISTENT WITH PREVIOUSLY REPORTED AND CALLED VALUE.    Culture   Final    PROTEUS MIRABILIS IN BOTH AEROBIC AND ANAEROBIC BOTTLES    Report Status 10/22/2015 FINAL  Final   Organism ID, Bacteria PROTEUS  MIRABILIS  Final      Susceptibility   Proteus mirabilis - MIC*    AMPICILLIN Value in next row Sensitive      SENSITIVE<=2    PIP/TAZO Value in next row Sensitive      SENSITIVE<=4    CEFTAZIDIME Value in next row Sensitive      SENSITIVE<=1    CEFTRIAXONE Value in next row Sensitive      SENSITIVE<=1    CEFEPIME Value in next row Sensitive      SENSITIVE<=1    IMIPENEM Value in next row Sensitive      SENSITIVE2    GENTAMICIN Value in next row Sensitive      SENSITIVE<=1    CIPROFLOXACIN Value in next row Sensitive      SENSITIVE<=0.25    * PROTEUS MIRABILIS  Blood Culture ID Panel (Reflexed)     Status: Abnormal   Collection Time: 10/20/15  9:57 AM  Result Value Ref Range Status   Enterococcus species NOT DETECTED NOT DETECTED Final   Listeria monocytogenes NOT DETECTED NOT DETECTED Final   Staphylococcus species NOT DETECTED NOT DETECTED Final   Staphylococcus aureus NOT DETECTED NOT DETECTED Final   Streptococcus species NOT DETECTED NOT DETECTED Final   Streptococcus agalactiae NOT DETECTED NOT DETECTED Final   Streptococcus pneumoniae NOT DETECTED NOT DETECTED Final   Streptococcus pyogenes NOT DETECTED NOT DETECTED Final   Acinetobacter baumannii NOT DETECTED NOT DETECTED Final   Enterobacteriaceae species DETECTED (A) NOT DETECTED Final    Comment: CRITICAL RESULT CALLED TO, READ BACK BY AND VERIFIED WITH: MATT MCBANE ON 10/21/15 AT 0005 [place]    Enterobacter cloacae complex NOT DETECTED NOT DETECTED Final   Escherichia coli NOT DETECTED NOT DETECTED Final   Klebsiella oxytoca NOT DETECTED NOT DETECTED Final   Klebsiella pneumoniae NOT DETECTED NOT DETECTED Final   Proteus species (A) NOT DETECTED Final    CRITICAL RESULT CALLED TO, READ BACK BY AND VERIFIED WITH:    Comment: MATT MCBANE ON 10/21/15 AT 0005 [place]   Serratia marcescens NOT DETECTED NOT DETECTED Final   Haemophilus influenzae NOT DETECTED NOT DETECTED Final   Neisseria meningitidis NOT DETECTED NOT  DETECTED  Final   Pseudomonas aeruginosa NOT DETECTED NOT DETECTED Final   Candida albicans NOT DETECTED NOT DETECTED Final   Candida glabrata NOT DETECTED NOT DETECTED Final   Candida krusei NOT DETECTED NOT DETECTED Final   Candida parapsilosis NOT DETECTED NOT DETECTED Final   Candida tropicalis NOT DETECTED NOT DETECTED Final   Carbapenem resistance NOT DETECTED NOT DETECTED Final   Methicillin resistance NOT DETECTED NOT DETECTED Final   Vancomycin resistance NOT DETECTED NOT DETECTED Final  MRSA PCR Screening     Status: None   Collection Time: 10/20/15  7:41 PM  Result Value Ref Range Status   MRSA by PCR NEGATIVE NEGATIVE Final    Comment:        The GeneXpert MRSA Assay (FDA approved for NASAL specimens only), is one component of a comprehensive MRSA colonization surveillance program. It is not intended to diagnose MRSA infection nor to guide or monitor treatment for MRSA infections.   CULTURE, BLOOD (ROUTINE X 2) w Reflex to PCR ID Panel     Status: None   Collection Time: 10/22/15 11:39 AM  Result Value Ref Range Status   Specimen Description BLOOD LEFT HAND  Final   Special Requests BOTTLES DRAWN AEROBIC AND ANAEROBIC 1CC  Final   Culture NO GROWTH 5 DAYS  Final   Report Status 10/27/2015 FINAL  Final  Culture, expectorated sputum-assessment     Status: None   Collection Time: 10/22/15 12:00 PM  Result Value Ref Range Status   Specimen Description SPUTUM  Final   Special Requests Normal  Final   Sputum evaluation THIS SPECIMEN IS ACCEPTABLE FOR SPUTUM CULTURE  Final   Report Status 10/22/2015 FINAL  Final  Culture, respiratory (NON-Expectorated)     Status: None   Collection Time: 10/22/15 12:00 PM  Result Value Ref Range Status   Specimen Description SPUTUM  Final   Special Requests Normal Reflexed from [id number]  Final   Gram Stain   Final    FAIR SPECIMEN - 70-80% WBCS FEW WBC SEEN RARE YEAST RARE GRAM NEGATIVE RODS    Culture LIGHT GROWTH ENTEROBACTER  AEROGENES  Final   Report Status 10/24/2015 FINAL  Final   Organism ID, Bacteria ENTEROBACTER AEROGENES  Final      Susceptibility   Enterobacter aerogenes - MIC*    CEFAZOLIN >=64 RESISTANT Resistant     CEFEPIME <=1 SENSITIVE Sensitive     CEFTAZIDIME <=1 SENSITIVE Sensitive     CEFTRIAXONE <=1 SENSITIVE Sensitive     CIPROFLOXACIN <=0.25 SENSITIVE Sensitive     GENTAMICIN <=1 SENSITIVE Sensitive     IMIPENEM 2 SENSITIVE Sensitive     TRIMETH/SULFA <=20 SENSITIVE Sensitive     PIP/TAZO <=4 SENSITIVE Sensitive     * LIGHT GROWTH ENTEROBACTER AEROGENES  Urine culture     Status: None   Collection Time: 10/22/15 12:45 PM  Result Value Ref Range Status   Specimen Description URINE, RANDOM  Final   Special Requests NONE  Final   Culture NO GROWTH 2 DAYS  Final   Report Status 10/24/2015 FINAL  Final  Urine culture     Status: None   Collection Time: 10/28/15  2:36 PM  Result Value Ref Range Status   Specimen Description URINE, RANDOM  Final   Special Requests NONE  Final   Culture NO GROWTH 2 DAYS  Final   Report Status 10/30/2015 FINAL  Final   Anti-infectives    Start     Dose/Rate Route Frequency Ordered Stop  11/02/15 2200  ceFEPIme (MAXIPIME) 2 g in dextrose 5 % 50 mL IVPB    Comments:  Pharmacy please adjust dosing as needed.   2 g 100 mL/hr over 30 Minutes Intravenous 3 times per day 11/02/15 1257     10/28/15 2200  ceFEPIme (MAXIPIME) 2 g in dextrose 5 % 50 mL IVPB  Status:  Discontinued    Comments:  Pharmacy please adjust dosing as needed.   2 g 100 mL/hr over 30 Minutes Intravenous Every 12 hours 10/28/15 1420 11/02/15 1257   10/28/15 1000  ceFEPIme (MAXIPIME) 1 g in dextrose 5 % 50 mL IVPB  Status:  Discontinued    Comments:  Pharmacy please adjust dosing as needed.   1 g 100 mL/hr over 30 Minutes Intravenous Every 12 hours 10/28/15 0845 10/28/15 1420   10/26/15 1800  cefTRIAXone (ROCEPHIN) 2 g in dextrose 5 % 50 mL IVPB  Status:  Discontinued     2 g 100 mL/hr  over 30 Minutes Intravenous Every 24 hours 10/26/15 1118 10/28/15 1132   10/25/15 1800  meropenem (MERREM) 500 mg in sodium chloride 0.9 % 50 mL IVPB  Status:  Discontinued     500 mg 100 mL/hr over 30 Minutes Intravenous Every 24 hours 10/24/15 1025 10/26/15 1050   10/23/15 1230  meropenem (MERREM) 1 g in sodium chloride 0.9 % 100 mL IVPB  Status:  Discontinued     1 g 200 mL/hr over 30 Minutes Intravenous Every 12 hours 10/23/15 1140 10/24/15 1025   10/22/15 1800  cefTRIAXone (ROCEPHIN) 2 g in dextrose 5 % 50 mL IVPB  Status:  Discontinued     2 g 100 mL/hr over 30 Minutes Intravenous Every 24 hours 10/21/15 1115 10/23/15 1136   10/21/15 1600  cefTRIAXone (ROCEPHIN) 2 g in dextrose 5 % 50 mL IVPB     2 g 100 mL/hr over 30 Minutes Intravenous  Once 10/21/15 1115 10/21/15 1714   10/20/15 1900  piperacillin-tazobactam (ZOSYN) IVPB 3.375 g  Status:  Discontinued     3.375 g 12.5 mL/hr over 240 Minutes Intravenous 3 times per day 10/20/15 1445 10/21/15 1051   10/20/15 1500  vancomycin (VANCOCIN) 1,500 mg in sodium chloride 0.9 % 500 mL IVPB     1,500 mg 250 mL/hr over 120 Minutes Intravenous STAT 10/20/15 1450 10/20/15 1710   10/20/15 1445  vancomycin (VANCOCIN) IVPB 1000 mg/200 mL premix  Status:  Discontinued     1,000 mg 200 mL/hr over 60 Minutes Intravenous STAT 10/20/15 1441 10/20/15 1450   10/20/15 1100  vancomycin (VANCOCIN) IVPB 1000 mg/200 mL premix     1,000 mg 200 mL/hr over 60 Minutes Intravenous  Once 10/20/15 1053 10/20/15 1203   10/20/15 1100  piperacillin-tazobactam (ZOSYN) IVPB 3.375 g     3.375 g 12.5 mL/hr over 240 Minutes Intravenous  Once 10/20/15 1053 10/20/15 1139       IMPRESSION: VDRF - resolved Stridor - resolved Hypoxic respiratory failure - improving Septic shock - resolved 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 Probably a component of CKD R hydronephrosis Severe hypernatremia -  improved Hypokalemia Protein-calorie malnutrition Hyperglycemia - probably DM2 not previously diagnosed Proteus UTI and bacteremia Persistent leukocytosis - concern that this is related to hydro/pyelo Mild anemia without acute blood loss   PLAN/REC: Cont sup[plemental O2 to maintain SpO2 > 92% Monitor respiratory status/WOB I have DC'd BiPAP as no longer needed MAP goal > 65 mmHg Will need Dr Lucky Cowboy to  see again in the next few days re: gangrenous feet Plan for R ureteral stent today Monitor BMET intermittently Monitor I/Os Correct electrolytes as indicated  D5W infusion rate decreased 02/07 DVT px: SQ heparin Monitor CBC intermittently Transfuse per usual guidelines Abx per ID service Cont to promote nutrition Cont Lantus and SSI If no problems after ureteral stent, will change to SDU status today and can probably move to med-surg floor tomorrow  Merton Border, MD PCCM service Mobile [phone] Pager [phone]

## 2015-11-03 NOTE — Progress Notes (Signed)
[facility] INFECTIOUS DISEASE PROGRESS NOTE Date of Admission:  10/20/2015     ID: April [name] is a 70 y.o. female with urosepsis  Active Problems:   Sepsis ([name])   Acute renal failure (HCC)   Altered mental status   NSTEMI (non-ST elevated myocardial infarction) ([name])   Sepsis due to urinary tract infection ([name])   Acute renal failure (ARF) (HCC)   Hydronephrosis   Endotracheally intubated   Respiratory distress   Arterial hypotension   Respiratory failure ([name])   Proteus septicemia (HCC)   Subjective: Getting uretheral stent  ROS  Eleven systems are reviewed and negative except per hpi  Medications:  Antibiotics Given (last 72 hours)    Date/Time Action Medication Dose Rate   10/31/15 1708 Given   ceFEPIme (MAXIPIME) 2 g in dextrose 5 % 50 mL IVPB 2 g 100 mL/hr   10/31/15 2157 Given   ceFEPIme (MAXIPIME) 2 g in dextrose 5 % 50 mL IVPB 2 g 100 mL/hr   11/01/15 1031 Given   ceFEPIme (MAXIPIME) 2 g in dextrose 5 % 50 mL IVPB 2 g 100 mL/hr   11/01/15 2240 Given   ceFEPIme (MAXIPIME) 2 g in dextrose 5 % 50 mL IVPB 2 g 100 mL/hr   11/02/15 1132 Given   ceFEPIme (MAXIPIME) 2 g in dextrose 5 % 50 mL IVPB 2 g 100 mL/hr   11/02/15 2338 Given   ceFEPIme (MAXIPIME) 2 g in dextrose 5 % 50 mL IVPB 2 g 100 mL/hr   11/03/15 0552 Given   ceFEPIme (MAXIPIME) 2 g in dextrose 5 % 50 mL IVPB 2 g 100 mL/hr     . antiseptic oral rinse  7 mL Mouth Rinse q12n4p  . ceFEPime (MAXIPIME) IV  2 g Intravenous 3 times per day  . chlorhexidine  15 mL Mouth Rinse BID  . heparin subcutaneous  5,000 Units Subcutaneous Q12H  . insulin aspart  0-5 Units Subcutaneous QHS  . insulin aspart  0-9 Units Subcutaneous TID WC  . insulin glargine  15 Units Subcutaneous Daily  . ipratropium-albuterol  3 mL Nebulization Q6H  . potassium chloride  10 mEq Intravenous Q1 Hr x 6    Objective: Vital signs in last 24 hours: Temp:  [98.2 F (36.8 C)-100.2 F (37.9 C)] 98.4 F (36.9 C) (02/07 1200) Pulse  Rate:  [77-108] 81 (02/07 1200) Resp:  [18-33] 24 (02/07 1200) BP: (100-146)/(36-61) 129/49 mmHg (02/07 1200) SpO2:  [93 %-100 %] 99 % (02/07 1200) FiO2 (%):  [35 %] 35 % (02/06 2039) Weight:  [119.8 kg (264 lb 1.8 oz)] 119.8 kg (264 lb 1.8 oz) (02/07 0500) Constitutional: oriented to person, place, and time. Obese, slowed mentationHENT: [place]/AT, PERRLA, no scleral icterus Mouth/Throat: Oropharynx is clear and dry . No oropharyngeal exudate.  Cardiovascular: Normal rate, regular rhythm and normal heart sounds.  No murmur heard.  Pulmonary/Chest:bil rhonhci  Neck = supple, no nuchal rigidity Abdominal: Soft. Bowel sounds are normal. exhibits no distension. There is no tenderness.  Lymphadenopathy: no cervical adenopathy. No axillary adenopathy Neurological: alert and oriented to person, place, and time.  Skin: gangrene bil toes  Psychiatric: a normal mood and affect. behavior is normal.   Lab Results  Recent Labs  11/02/15 0509 11/03/15 0539 11/03/15 1245  WBC 33.5*  --   --   HGB 9.3*  --   --   HCT 29.1*  --   --   NA 150* 146*  --   K 3.5 2.8* 3.3*  CL 119* 114*  --   CO2 25 29  --   BUN 30* 28*  --   CREATININE 0.98 1.06*  --     Microbiology: Results for orders placed or performed during the hospital encounter of 10/20/15  Urine culture     Status: None   Collection Time: 10/20/15  9:57 AM  Result Value Ref Range Status   Specimen Description URINE, RANDOM  Final   Special Requests NONE  Final   Culture >=100,000 COLONIES/mL PROTEUS MIRABILIS  Final   Report Status 10/22/2015 FINAL  Final   Organism ID, Bacteria PROTEUS MIRABILIS  Final      Susceptibility   Proteus mirabilis - MIC*    AMPICILLIN <=2 SENSITIVE Sensitive     GENTAMICIN <=1 SENSITIVE Sensitive     CEFTRIAXONE Value in next row Sensitive      SENSITIVE<=1    CIPROFLOXACIN Value in next row Sensitive      SENSITIVE<=0.25    IMIPENEM Value in next row Sensitive      SENSITIVE2     NITROFURANTOIN Value in next row Resistant      RESISTANT128    TRIMETH/SULFA Value in next row Sensitive      SENSITIVE<=20    PIP/TAZO Value in next row Sensitive      SENSITIVE<=4    AMPICILLIN/SULBACTAM Value in next row Sensitive      SENSITIVE<=2    * >=100,000 COLONIES/mL PROTEUS MIRABILIS  Culture, blood (routine x 2)     Status: None   Collection Time: 10/20/15  9:57 AM  Result Value Ref Range Status   Specimen Description BLOOD RIGHT WRIST  Final   Special Requests   Final    BOTTLES DRAWN AEROBIC AND ANAEROBIC ANA 1ML AER 4ML   Culture  Setup Time   Final    GRAM NEGATIVE RODS IN BOTH AEROBIC AND ANAEROBIC BOTTLES CRITICAL RESULT CALLED TO, READ BACK BY AND VERIFIED WITH: MATT MCBANE ON 10/21/15 AT 0005 [facility] CONFIRMED BY [place]    Culture   Final    PROTEUS MIRABILIS IN BOTH AEROBIC AND ANAEROBIC BOTTLES    Report Status 10/22/2015 FINAL  Final   Organism ID, Bacteria PROTEUS MIRABILIS  Final      Susceptibility   Proteus mirabilis - MIC*    AMPICILLIN/SULBACTAM Value in next row Sensitive      SENSITIVE<=2    PIP/TAZO Value in next row Sensitive      SENSITIVE<=4    CEFTRIAXONE Value in next row Sensitive      SENSITIVE<=1    IMIPENEM Value in next row Sensitive      SENSITIVE2    GENTAMICIN Value in next row Sensitive      SENSITIVE<=1    CIPROFLOXACIN Value in next row Sensitive      SENSITIVE<=0.25    * PROTEUS MIRABILIS  Culture, blood (routine x 2)     Status: None   Collection Time: 10/20/15  9:57 AM  Result Value Ref Range Status   Specimen Description BLOOD LEFT HAND  Final   Special Requests   Final    BOTTLES DRAWN AEROBIC AND ANAEROBIC AER 3ML ANA 2ML   Culture  Setup Time   Final    GRAM NEGATIVE RODS IN BOTH AEROBIC AND ANAEROBIC BOTTLES CRITICAL VALUE NOTED.  VALUE IS CONSISTENT WITH PREVIOUSLY REPORTED AND CALLED VALUE.    Culture   Final    PROTEUS MIRABILIS IN BOTH AEROBIC AND ANAEROBIC BOTTLES    Report Status 10/22/2015  FINAL  Final    Organism ID, Bacteria PROTEUS MIRABILIS  Final      Susceptibility   Proteus mirabilis - MIC*    AMPICILLIN Value in next row Sensitive      SENSITIVE<=2    PIP/TAZO Value in next row Sensitive      SENSITIVE<=4    CEFTAZIDIME Value in next row Sensitive      SENSITIVE<=1    CEFTRIAXONE Value in next row Sensitive      SENSITIVE<=1    CEFEPIME Value in next row Sensitive      SENSITIVE<=1    IMIPENEM Value in next row Sensitive      SENSITIVE2    GENTAMICIN Value in next row Sensitive      SENSITIVE<=1    CIPROFLOXACIN Value in next row Sensitive      SENSITIVE<=0.25    * PROTEUS MIRABILIS  Blood Culture ID Panel (Reflexed)     Status: Abnormal   Collection Time: 10/20/15  9:57 AM  Result Value Ref Range Status   Enterococcus species NOT DETECTED NOT DETECTED Final   Listeria monocytogenes NOT DETECTED NOT DETECTED Final   Staphylococcus species NOT DETECTED NOT DETECTED Final   Staphylococcus aureus NOT DETECTED NOT DETECTED Final   Streptococcus species NOT DETECTED NOT DETECTED Final   Streptococcus agalactiae NOT DETECTED NOT DETECTED Final   Streptococcus pneumoniae NOT DETECTED NOT DETECTED Final   Streptococcus pyogenes NOT DETECTED NOT DETECTED Final   Acinetobacter baumannii NOT DETECTED NOT DETECTED Final   Enterobacteriaceae species DETECTED (A) NOT DETECTED Final    Comment: CRITICAL RESULT CALLED TO, READ BACK BY AND VERIFIED WITH: MATT MCBANE ON 10/21/15 AT 0005 [place]    Enterobacter cloacae complex NOT DETECTED NOT DETECTED Final   Escherichia coli NOT DETECTED NOT DETECTED Final   Klebsiella oxytoca NOT DETECTED NOT DETECTED Final   Klebsiella pneumoniae NOT DETECTED NOT DETECTED Final   Proteus species (A) NOT DETECTED Final    CRITICAL RESULT CALLED TO, READ BACK BY AND VERIFIED WITH:    Comment: MATT MCBANE ON 10/21/15 AT 0005 [place]   Serratia marcescens NOT DETECTED NOT DETECTED Final   Haemophilus influenzae NOT DETECTED NOT DETECTED Final   Neisseria  meningitidis NOT DETECTED NOT DETECTED Final   Pseudomonas aeruginosa NOT DETECTED NOT DETECTED Final   Candida albicans NOT DETECTED NOT DETECTED Final   Candida glabrata NOT DETECTED NOT DETECTED Final   Candida krusei NOT DETECTED NOT DETECTED Final   Candida parapsilosis NOT DETECTED NOT DETECTED Final   Candida tropicalis NOT DETECTED NOT DETECTED Final   Carbapenem resistance NOT DETECTED NOT DETECTED Final   Methicillin resistance NOT DETECTED NOT DETECTED Final   Vancomycin resistance NOT DETECTED NOT DETECTED Final  MRSA PCR Screening     Status: None   Collection Time: 10/20/15  7:41 PM  Result Value Ref Range Status   MRSA by PCR NEGATIVE NEGATIVE Final    Comment:        The GeneXpert MRSA Assay (FDA approved for NASAL specimens only), is one component of a comprehensive MRSA colonization surveillance program. It is not intended to diagnose MRSA infection nor to guide or monitor treatment for MRSA infections.   CULTURE, BLOOD (ROUTINE X 2) w Reflex to PCR ID Panel     Status: None   Collection Time: 10/22/15 11:39 AM  Result Value Ref Range Status   Specimen Description BLOOD LEFT HAND  Final   Special Requests BOTTLES DRAWN AEROBIC AND ANAEROBIC [place]  Final  Culture NO GROWTH 5 DAYS  Final   Report Status 10/27/2015 FINAL  Final  Culture, expectorated sputum-assessment     Status: None   Collection Time: 10/22/15 12:00 PM  Result Value Ref Range Status   Specimen Description SPUTUM  Final   Special Requests Normal  Final   Sputum evaluation THIS SPECIMEN IS ACCEPTABLE FOR SPUTUM CULTURE  Final   Report Status 10/22/2015 FINAL  Final  Culture, respiratory (NON-Expectorated)     Status: None   Collection Time: 10/22/15 12:00 PM  Result Value Ref Range Status   Specimen Description SPUTUM  Final   Special Requests Normal Reflexed from [id number]  Final   Gram Stain   Final    FAIR SPECIMEN - 70-80% WBCS FEW WBC SEEN RARE YEAST RARE GRAM NEGATIVE RODS    Culture  LIGHT GROWTH ENTEROBACTER AEROGENES  Final   Report Status 10/24/2015 FINAL  Final   Organism ID, Bacteria ENTEROBACTER AEROGENES  Final      Susceptibility   Enterobacter aerogenes - MIC*    CEFAZOLIN >=64 RESISTANT Resistant     CEFEPIME <=1 SENSITIVE Sensitive     CEFTAZIDIME <=1 SENSITIVE Sensitive     CEFTRIAXONE <=1 SENSITIVE Sensitive     CIPROFLOXACIN <=0.25 SENSITIVE Sensitive     GENTAMICIN <=1 SENSITIVE Sensitive     IMIPENEM 2 SENSITIVE Sensitive     TRIMETH/SULFA <=20 SENSITIVE Sensitive     PIP/TAZO <=4 SENSITIVE Sensitive     * LIGHT GROWTH ENTEROBACTER AEROGENES  Urine culture     Status: None   Collection Time: 10/22/15 12:45 PM  Result Value Ref Range Status   Specimen Description URINE, RANDOM  Final   Special Requests NONE  Final   Culture NO GROWTH 2 DAYS  Final   Report Status 10/24/2015 FINAL  Final  Urine culture     Status: None   Collection Time: 10/28/15  2:36 PM  Result Value Ref Range Status   Specimen Description URINE, RANDOM  Final   Special Requests NONE  Final   Culture NO GROWTH 2 DAYS  Final   Report Status 10/30/2015 FINAL  Final     Studies/Results: US Renal  11/02/2015  CLINICAL DATA:  Followup for hydronephrosis. EXAM: RENAL / URINARY TRACT ULTRASOUND COMPLETE COMPARISON:  CT, 10/27/2015.  Renal ultrasound, 10/21/2015. FINDINGS: Right Kidney: Length: 12.5 cm. Normal parenchymal echogenicity. Mild hydronephrosis similar to the prior ultrasound and CT. No mass or stone. Left Kidney: Length: 12.1 cm. Echogenicity within normal limits. No mass or hydronephrosis visualized. Bladder: Foley catheter in place. Bladder still mildly distended. No bladder mass or stone. IMPRESSION: 1. Mild right hydronephrosis similar to the prior exams. 2. No left hydronephrosis.  No renal masses or stones. Electronically Signed   By: Lajean Manes M.D.   On: 11/02/2015 15:35    Assessment/Plan: April [name] is a 70 y.o. female found down and admitted with ARF<  urosepsis with proteus acute resp failure with sputum cxs growing enterobacter.  Clinically improving but has residual LE gangrene, elevated WBC and noted to have R hydro on renal US. Repeat UA neg and ucx neg. CXR with no onsolidation.  Currently day 15 abx. Last fever 101 on 2/3. WBC remains 33.   Recommendations Cont cefepime.  As she improves can transiition to oral therapy with cipro  Thank you very much for the consult. Will follow with you.  [place], [place]   11/03/2015, 2:00 PM

## 2015-11-03 NOTE — Progress Notes (Signed)
[facility] NOTE  Pharmacy Consult for Cefepime Dosing, Electrolyte Management, Constipation Prevention Indication: HD/ Proteus Bacteremia     Allergies  Allergen Reactions  . Prednisone Anaphylaxis    Patient Measurements: Height: '5\' 7"'$  (170.2 cm) Weight: 264 lb 1.8 oz (119.8 kg) IBW/kg (Calculated) : 61.6   Vital Signs: Temp: 98.4 F (36.9 C) (02/07 1200) Temp Source: Core (Comment) (02/07 0400) BP: 129/49 mmHg (02/07 1200) Pulse Rate: 81 (02/07 1200) Intake/Output from previous day: 02/06 0701 - 02/07 0700 In: 3108.8 [I.V.:3008.8; IV Piggyback:100] Out: 5784 [Urine:1605] Intake/Output from this shift: Total I/O In: 1024.6 [I.V.:574.6; IV Piggyback:450] Out: 800 [Urine:800] Vent settings for last 24 hours: Vent Mode:  [-]  FiO2 (%):  [35 %] 35 %  Labs:  Recent Labs  11/01/15 0455 11/01/15 1300 11/02/15 0509 11/03/15 0539  WBC  --   --  33.5*  --   HGB  --   --  9.3*  --   HCT  --   --  29.1*  --   PLT  --   --  203  --   CREATININE 1.09* 1.12* 0.98 1.06*  MG 2.0  --   --  1.8  PHOS 3.2  --   --  3.6  ALBUMIN  --   --  1.7* 1.6*  PROT  --   --  5.8* 5.4*  AST  --   --  20 20  ALT  --   --  29 26  ALKPHOS  --   --  80 87  BILITOT  --   --  1.0 0.7   Estimated Creatinine Clearance: 67.1 mL/min (by C-G formula based on Cr of 1.06).   Recent Labs  11/02/15 2124 11/03/15 0719 11/03/15 1119  GLUCAP 183* 230* 218*    Microbiology: Recent Results (from the past 720 hour(s))  Urine culture     Status: None   Collection Time: 10/20/15  9:57 AM  Result Value Ref Range Status   Specimen Description URINE, RANDOM  Final   Special Requests NONE  Final   Culture >=100,000 COLONIES/mL PROTEUS MIRABILIS  Final   Report Status 10/22/2015 FINAL  Final   Organism ID, Bacteria PROTEUS MIRABILIS  Final      Susceptibility   Proteus mirabilis - MIC*    AMPICILLIN <=2 SENSITIVE Sensitive     GENTAMICIN <=1 SENSITIVE Sensitive    CEFTRIAXONE Value in next row Sensitive      SENSITIVE<=1    CIPROFLOXACIN Value in next row Sensitive      SENSITIVE<=0.25    IMIPENEM Value in next row Sensitive      SENSITIVE2    NITROFURANTOIN Value in next row Resistant      RESISTANT128    TRIMETH/SULFA Value in next row Sensitive      SENSITIVE<=20    PIP/TAZO Value in next row Sensitive      SENSITIVE<=4    AMPICILLIN/SULBACTAM Value in next row Sensitive      SENSITIVE<=2    * >=100,000 COLONIES/mL PROTEUS MIRABILIS  Culture, blood (routine x 2)     Status: None   Collection Time: 10/20/15  9:57 AM  Result Value Ref Range Status   Specimen Description BLOOD RIGHT WRIST  Final   Special Requests   Final    BOTTLES DRAWN AEROBIC AND ANAEROBIC ANA 1ML AER 4ML   Culture  Setup Time   Final    GRAM NEGATIVE RODS IN BOTH AEROBIC AND ANAEROBIC BOTTLES CRITICAL RESULT  CALLED TO, READ BACK BY AND VERIFIED WITH: MATT MCBANE ON 10/21/15 AT 0005 [employer] CONFIRMED BY [facility]    Culture   Final    PROTEUS MIRABILIS IN BOTH AEROBIC AND ANAEROBIC BOTTLES    Report Status 10/22/2015 FINAL  Final   Organism ID, Bacteria PROTEUS MIRABILIS  Final      Susceptibility   Proteus mirabilis - MIC*    AMPICILLIN/SULBACTAM Value in next row Sensitive      SENSITIVE<=2    PIP/TAZO Value in next row Sensitive      SENSITIVE<=4    CEFTRIAXONE Value in next row Sensitive      SENSITIVE<=1    IMIPENEM Value in next row Sensitive      SENSITIVE2    GENTAMICIN Value in next row Sensitive      SENSITIVE<=1    CIPROFLOXACIN Value in next row Sensitive      SENSITIVE<=0.25    * PROTEUS MIRABILIS  Culture, blood (routine x 2)     Status: None   Collection Time: 10/20/15  9:57 AM  Result Value Ref Range Status   Specimen Description BLOOD LEFT HAND  Final   Special Requests   Final    BOTTLES DRAWN AEROBIC AND ANAEROBIC AER 3ML ANA 2ML   Culture  Setup Time   Final    GRAM NEGATIVE RODS IN BOTH AEROBIC AND ANAEROBIC BOTTLES CRITICAL VALUE NOTED.   VALUE IS CONSISTENT WITH PREVIOUSLY REPORTED AND CALLED VALUE.    Culture   Final    PROTEUS MIRABILIS IN BOTH AEROBIC AND ANAEROBIC BOTTLES    Report Status 10/22/2015 FINAL  Final   Organism ID, Bacteria PROTEUS MIRABILIS  Final      Susceptibility   Proteus mirabilis - MIC*    AMPICILLIN Value in next row Sensitive      SENSITIVE<=2    PIP/TAZO Value in next row Sensitive      SENSITIVE<=4    CEFTAZIDIME Value in next row Sensitive      SENSITIVE<=1    CEFTRIAXONE Value in next row Sensitive      SENSITIVE<=1    CEFEPIME Value in next row Sensitive      SENSITIVE<=1    IMIPENEM Value in next row Sensitive      SENSITIVE2    GENTAMICIN Value in next row Sensitive      SENSITIVE<=1    CIPROFLOXACIN Value in next row Sensitive      SENSITIVE<=0.25    * PROTEUS MIRABILIS  Blood Culture ID Panel (Reflexed)     Status: Abnormal   Collection Time: 10/20/15  9:57 AM  Result Value Ref Range Status   Enterococcus species NOT DETECTED NOT DETECTED Final   Listeria monocytogenes NOT DETECTED NOT DETECTED Final   Staphylococcus species NOT DETECTED NOT DETECTED Final   Staphylococcus aureus NOT DETECTED NOT DETECTED Final   Streptococcus species NOT DETECTED NOT DETECTED Final   Streptococcus agalactiae NOT DETECTED NOT DETECTED Final   Streptococcus pneumoniae NOT DETECTED NOT DETECTED Final   Streptococcus pyogenes NOT DETECTED NOT DETECTED Final   Acinetobacter baumannii NOT DETECTED NOT DETECTED Final   Enterobacteriaceae species DETECTED (A) NOT DETECTED Final    Comment: CRITICAL RESULT CALLED TO, READ BACK BY AND VERIFIED WITH: MATT MCBANE ON 10/21/15 AT 0005 [place]    Enterobacter cloacae complex NOT DETECTED NOT DETECTED Final   Escherichia coli NOT DETECTED NOT DETECTED Final   Klebsiella oxytoca NOT DETECTED NOT DETECTED Final   Klebsiella pneumoniae NOT DETECTED NOT DETECTED Final  Proteus species (A) NOT DETECTED Final    CRITICAL RESULT CALLED TO, READ BACK BY AND  VERIFIED WITH:    Comment: MATT MCBANE ON 10/21/15 AT 0005 [place]   Serratia marcescens NOT DETECTED NOT DETECTED Final   Haemophilus influenzae NOT DETECTED NOT DETECTED Final   Neisseria meningitidis NOT DETECTED NOT DETECTED Final   Pseudomonas aeruginosa NOT DETECTED NOT DETECTED Final   Candida albicans NOT DETECTED NOT DETECTED Final   Candida glabrata NOT DETECTED NOT DETECTED Final   Candida krusei NOT DETECTED NOT DETECTED Final   Candida parapsilosis NOT DETECTED NOT DETECTED Final   Candida tropicalis NOT DETECTED NOT DETECTED Final   Carbapenem resistance NOT DETECTED NOT DETECTED Final   Methicillin resistance NOT DETECTED NOT DETECTED Final   Vancomycin resistance NOT DETECTED NOT DETECTED Final  MRSA PCR Screening     Status: None   Collection Time: 10/20/15  7:41 PM  Result Value Ref Range Status   MRSA by PCR NEGATIVE NEGATIVE Final    Comment:        The GeneXpert MRSA Assay (FDA approved for NASAL specimens only), is one component of a comprehensive MRSA colonization surveillance program. It is not intended to diagnose MRSA infection nor to guide or monitor treatment for MRSA infections.   CULTURE, BLOOD (ROUTINE X 2) w Reflex to PCR ID Panel     Status: None   Collection Time: 10/22/15 11:39 AM  Result Value Ref Range Status   Specimen Description BLOOD LEFT HAND  Final   Special Requests BOTTLES DRAWN AEROBIC AND ANAEROBIC 1CC  Final   Culture NO GROWTH 5 DAYS  Final   Report Status 10/27/2015 FINAL  Final  Culture, expectorated sputum-assessment     Status: None   Collection Time: 10/22/15 12:00 PM  Result Value Ref Range Status   Specimen Description SPUTUM  Final   Special Requests Normal  Final   Sputum evaluation THIS SPECIMEN IS ACCEPTABLE FOR SPUTUM CULTURE  Final   Report Status 10/22/2015 FINAL  Final  Culture, respiratory (NON-Expectorated)     Status: None   Collection Time: 10/22/15 12:00 PM  Result Value Ref Range Status   Specimen  Description SPUTUM  Final   Special Requests Normal Reflexed from [id number]  Final   Gram Stain   Final    FAIR SPECIMEN - 70-80% WBCS FEW WBC SEEN RARE YEAST RARE GRAM NEGATIVE RODS    Culture LIGHT GROWTH ENTEROBACTER AEROGENES  Final   Report Status 10/24/2015 FINAL  Final   Organism ID, Bacteria ENTEROBACTER AEROGENES  Final      Susceptibility   Enterobacter aerogenes - MIC*    CEFAZOLIN >=64 RESISTANT Resistant     CEFEPIME <=1 SENSITIVE Sensitive     CEFTAZIDIME <=1 SENSITIVE Sensitive     CEFTRIAXONE <=1 SENSITIVE Sensitive     CIPROFLOXACIN <=0.25 SENSITIVE Sensitive     GENTAMICIN <=1 SENSITIVE Sensitive     IMIPENEM 2 SENSITIVE Sensitive     TRIMETH/SULFA <=20 SENSITIVE Sensitive     PIP/TAZO <=4 SENSITIVE Sensitive     * LIGHT GROWTH ENTEROBACTER AEROGENES  Urine culture     Status: None   Collection Time: 10/22/15 12:45 PM  Result Value Ref Range Status   Specimen Description URINE, RANDOM  Final   Special Requests NONE  Final   Culture NO GROWTH 2 DAYS  Final   Report Status 10/24/2015 FINAL  Final  Urine culture     Status: None   Collection Time: 10/28/15  2:36 PM  Result Value Ref Range Status   Specimen Description URINE, RANDOM  Final   Special Requests NONE  Final   Culture NO GROWTH 2 DAYS  Final   Report Status 10/30/2015 FINAL  Final    Medications:  Scheduled:  . antiseptic oral rinse  7 mL Mouth Rinse q12n4p  . ceFEPime (MAXIPIME) IV  2 g Intravenous 3 times per day  . chlorhexidine  15 mL Mouth Rinse BID  . heparin subcutaneous  5,000 Units Subcutaneous Q12H  . insulin aspart  0-5 Units Subcutaneous QHS  . insulin aspart  0-9 Units Subcutaneous TID WC  . insulin glargine  15 Units Subcutaneous Daily  . ipratropium-albuterol  3 mL Nebulization Q6H  . potassium chloride  10 mEq Intravenous Q1 Hr x 6   Infusions:  . dextrose 75 mL/hr at 11/03/15 1100    Assessment: 70 y/o F with septic shock from Proteus UTI. Patient with ARF recently  transitioned from CRRT to HD not currently requiring HD.    Plan:  1. Will continue cefepime dosing  2g IV q8 hours for renal function.   2. Electrolytes: Potassium low at 2.8 this AM and replaced with 6 runs of KCl 10 meq. Repeat K this afternoon is 3.3. Level drawn early but will go ahead and replace potassium with another 3 runs of KCl 10 meq and f/u am labs.   3. Constipation: Patient with last BM 2/7. No medications scheduled at this time. Pharmacy will continue to monitor and adjust per consult.   Pharmacy will continue to monitor and adjust per consult.    Ulice Dash, PharmD Clinical Pharmacist   11/03/2015

## 2015-11-03 NOTE — Progress Notes (Signed)
Nutrition Follow-up     INTERVENTION:   Coordination of Care: await diet progression, SLP plans to reevaluate for diet advancement in AM. Recommend addition of nutritional supplement once diet advanced. If unable to advanced diet within next 24-48 hours, recommend consideration of nutrition support   NUTRITION DIAGNOSIS:   Inadequate oral intake related to acute illness as evidenced by NPO status. Continues  GOAL:   Provide needs based on ASPEN/SCCM guidelines  MONITOR:    (Energy Intake, Pulmonary, Digestive System, Electrolyte/Renal Profile, Glucose Profile)  REASON FOR ASSESSMENT:   Consult Enteral/tube feeding initiation and management  ASSESSMENT:     Pt down for cystoscopy with ureteral stent placement today, breathing improved, pt less confused  Diet Order:  Diet NPO time specified, SLP evaluated yesterday but unable to advance diet due to increased risk for aspiration  Energy Intake: Limited po intake or NPO since extubation on 10/29/15  Skin:  Reviewed, no issues  Last BM: 2/7  Electrolyte and Renal Profile:  Recent Labs Lab 10/30/15 0431  10/31/15 0649  11/01/15 0455 11/01/15 1300 11/02/15 0509 11/03/15 0539 11/03/15 1245  BUN 54*  < >  --   < > 39* 34* 30* 28*  --   CREATININE 1.33*  < >  --   < > 1.09* 1.12* 0.98 1.06*  --   NA 157*  < >  --   < > 156* 151* 150* 146*  --   K 3.7  < >  --   < > 3.3* 3.4* 3.5 2.8* 3.3*  MG 2.1  --  2.0  --  2.0  --   --  1.8  --   PHOS 3.3  --   --   --  3.2  --   --  3.6  --   < > = values in this interval not displayed. Glucose Profile:  Recent Labs  11/02/15 2124 11/03/15 0719 11/03/15 1119  GLUCAP 183* 230* 218*   Meds: D5 at 75 ml/hr, potassium chloride  Height:   Ht Readings from Last 1 Encounters:  11/02/15 '5\' 7"'$  (1.702 m)    Weight:   Wt Readings from Last 1 Encounters:  11/03/15 264 lb 1.8 oz (119.8 kg)    Filed Weights   11/01/15 0500 11/02/15 0400 11/03/15 0500  Weight: 266 lb 12.1 oz  (121 kg) 266 lb 5.1 oz (120.8 kg) 264 lb 1.8 oz (119.8 kg)    BMI:  Body mass index is 41.36 kg/(m^2).  Estimated Nutritional Needs:   Kcal:  BEE 1167 kcals (IF 1.1-1.2, AF 1.2) 1540-1680 kcals/d. (Using IBW of 61kg)  Protein:  (1.0-1.2 g/kg) 61-73 g/d  Fluid:  1525-1830 (25-53m/kg)   EDUCATION NEEDS:   No education needs identified at this time  HSturgis RD, LDN ([phone]  ([phone]/On-Call Pager

## 2015-11-03 NOTE — Progress Notes (Signed)
Speech Therapy note: pt d/t have a procedure today. Will f/u w/ continued assessment of swallow function and readiness for an oral diet when pt is able to take po's(post procedure). Pt is NPO. NSG updated and agreed.

## 2015-11-03 NOTE — Progress Notes (Signed)
[facility]  ROUNDING NOTE   Subjective:  Has right sided hydronephrosis. For cystoscopy today. Na still high at 150.   On D5W at 125cc/hr.   Objective:  Vital signs in last 24 hours:  Temp:  [98.8 F (37.1 C)-100.2 F (37.9 C)] 98.8 F (37.1 C) (02/07 0600) Pulse Rate:  [79-108] 87 (02/07 0600) Resp:  [19-38] 27 (02/07 0600) BP: (100-167)/(36-75) 130/56 mmHg (02/07 0600) SpO2:  [93 %-100 %] 99 % (02/07 0600) FiO2 (%):  [35 %] 35 % (02/06 2039) Weight:  [119.8 kg (264 lb 1.8 oz)] 119.8 kg (264 lb 1.8 oz) (02/07 0500)  Weight change: -1 kg (-2 lb 3.3 oz) Filed Weights   11/01/15 0500 11/02/15 0400 11/03/15 0500  Weight: 121 kg (266 lb 12.1 oz) 120.8 kg (266 lb 5.1 oz) 119.8 kg (264 lb 1.8 oz)    Intake/Output: I/O last 3 completed shifts: In: 4548.9 [I.V.:4398.9; IV Piggyback:150] Out: 2250 [Urine:2250]   Intake/Output this shift:     Physical Exam: General: Critically ill appearing   Head: [place]/AT hard of hearing OM dry  Eyes: Eyes open   Neck: supple  Lungs:  Scattered rhonchi, normal effort  Heart: Regular, no rub   Abdomen:  Soft, nontender, obese  Extremities: 3+ peripheral edema. +anasarca  Neurologic:  alert, able to follow simple commands   Skin: Feet in soft support, wounds noted b/l LE's       Basic Metabolic Panel:  Recent Labs Lab 10/28/15 0443  10/30/15 0431  10/31/15 0649  10/31/15 1708 10/31/15 2124 11/01/15 0455 11/01/15 1300 11/02/15 0509  NA 152*  < > 157*  < >  --   --  155* 157* 156* 151* 150*  K 3.9  < > 3.7  < >  --   < > 3.2* 3.4* 3.3* 3.4* 3.5  CL 119*  < > 121*  < >  --   --  120* 122* 123* 121* 119*  CO2 29  < > 31  < >  --   --  '29 31 30 27 25  '$ GLUCOSE 209*  < > 171*  < >  --   --  279* 247* 144* 185* 184*  BUN 71*  < > 54*  < >  --   --  42* 40* 39* 34* 30*  CREATININE 1.60*  < > 1.33*  < >  --   --  1.27* 1.22* 1.09* 1.12* 0.98  CALCIUM 7.7*  < > 8.1*  < >  --   --  7.8* 7.8* 7.6* 7.6* 7.7*  MG 2.3  --  2.1   --  2.0  --   --   --  2.0  --   --   PHOS  --   --  3.3  --   --   --   --   --  3.2  --   --   < > = values in this interval not displayed.  Liver Function Tests:  Recent Labs Lab 11/02/15 0509  AST 20  ALT 29  ALKPHOS 80  BILITOT 1.0  PROT 5.8*  ALBUMIN 1.7*   No results for input(s): LIPASE, AMYLASE in the last 168 hours.  Recent Labs Lab 10/31/15 0649  AMMONIA 18    CBC:  Recent Labs Lab 10/28/15 0443 10/29/15 0421 10/30/15 0431 10/31/15 0500 11/02/15 0509  WBC 26.2* 31.0* 29.4* 28.4* 33.5*  HGB 9.7* 9.4* 10.0* 9.9* 9.3*  HCT 29.9* 29.0* 30.8* 30.6* 29.1*  MCV  93.0 92.1 94.1 93.7 94.1  PLT 109* 132* 181 211 203    Cardiac Enzymes: No results for input(s): CKTOTAL, CKMB, CKMBINDEX, TROPONINI in the last 168 hours.  BNP: Invalid input(s): POCBNP  CBG:  Recent Labs Lab 11/02/15 1315 11/02/15 1403 11/02/15 1626 11/02/15 2124 11/03/15 0719  GLUCAP 182* 185* 185* 183* 230*    Microbiology: Results for orders placed or performed during the hospital encounter of 10/20/15  Urine culture     Status: None   Collection Time: 10/20/15  9:57 AM  Result Value Ref Range Status   Specimen Description URINE, RANDOM  Final   Special Requests NONE  Final   Culture >=100,000 COLONIES/mL PROTEUS MIRABILIS  Final   Report Status 10/22/2015 FINAL  Final   Organism ID, Bacteria PROTEUS MIRABILIS  Final      Susceptibility   Proteus mirabilis - MIC*    AMPICILLIN <=2 SENSITIVE Sensitive     GENTAMICIN <=1 SENSITIVE Sensitive     CEFTRIAXONE Value in next row Sensitive      SENSITIVE<=1    CIPROFLOXACIN Value in next row Sensitive      SENSITIVE<=0.25    IMIPENEM Value in next row Sensitive      SENSITIVE2    NITROFURANTOIN Value in next row Resistant      RESISTANT128    TRIMETH/SULFA Value in next row Sensitive      SENSITIVE<=20    PIP/TAZO Value in next row Sensitive      SENSITIVE<=4    AMPICILLIN/SULBACTAM Value in next row Sensitive       SENSITIVE<=2    * >=100,000 COLONIES/mL PROTEUS MIRABILIS  Culture, blood (routine x 2)     Status: None   Collection Time: 10/20/15  9:57 AM  Result Value Ref Range Status   Specimen Description BLOOD RIGHT WRIST  Final   Special Requests   Final    BOTTLES DRAWN AEROBIC AND ANAEROBIC ANA 1ML AER 4ML   Culture  Setup Time   Final    GRAM NEGATIVE RODS IN BOTH AEROBIC AND ANAEROBIC BOTTLES CRITICAL RESULT CALLED TO, READ BACK BY AND VERIFIED WITH: MATT MCBANE ON 10/21/15 AT 0005 [facility] CONFIRMED BY [place]    Culture   Final    PROTEUS MIRABILIS IN BOTH AEROBIC AND ANAEROBIC BOTTLES    Report Status 10/22/2015 FINAL  Final   Organism ID, Bacteria PROTEUS MIRABILIS  Final      Susceptibility   Proteus mirabilis - MIC*    AMPICILLIN/SULBACTAM Value in next row Sensitive      SENSITIVE<=2    PIP/TAZO Value in next row Sensitive      SENSITIVE<=4    CEFTRIAXONE Value in next row Sensitive      SENSITIVE<=1    IMIPENEM Value in next row Sensitive      SENSITIVE2    GENTAMICIN Value in next row Sensitive      SENSITIVE<=1    CIPROFLOXACIN Value in next row Sensitive      SENSITIVE<=0.25    * PROTEUS MIRABILIS  Culture, blood (routine x 2)     Status: None   Collection Time: 10/20/15  9:57 AM  Result Value Ref Range Status   Specimen Description BLOOD LEFT HAND  Final   Special Requests   Final    BOTTLES DRAWN AEROBIC AND ANAEROBIC AER 3ML ANA 2ML   Culture  Setup Time   Final    GRAM NEGATIVE RODS IN BOTH AEROBIC AND ANAEROBIC BOTTLES CRITICAL VALUE NOTED.  VALUE IS  CONSISTENT WITH PREVIOUSLY REPORTED AND CALLED VALUE.    Culture   Final    PROTEUS MIRABILIS IN BOTH AEROBIC AND ANAEROBIC BOTTLES    Report Status 10/22/2015 FINAL  Final   Organism ID, Bacteria PROTEUS MIRABILIS  Final      Susceptibility   Proteus mirabilis - MIC*    AMPICILLIN Value in next row Sensitive      SENSITIVE<=2    PIP/TAZO Value in next row Sensitive      SENSITIVE<=4    CEFTAZIDIME Value in  next row Sensitive      SENSITIVE<=1    CEFTRIAXONE Value in next row Sensitive      SENSITIVE<=1    CEFEPIME Value in next row Sensitive      SENSITIVE<=1    IMIPENEM Value in next row Sensitive      SENSITIVE2    GENTAMICIN Value in next row Sensitive      SENSITIVE<=1    CIPROFLOXACIN Value in next row Sensitive      SENSITIVE<=0.25    * PROTEUS MIRABILIS  Blood Culture ID Panel (Reflexed)     Status: Abnormal   Collection Time: 10/20/15  9:57 AM  Result Value Ref Range Status   Enterococcus species NOT DETECTED NOT DETECTED Final   Listeria monocytogenes NOT DETECTED NOT DETECTED Final   Staphylococcus species NOT DETECTED NOT DETECTED Final   Staphylococcus aureus NOT DETECTED NOT DETECTED Final   Streptococcus species NOT DETECTED NOT DETECTED Final   Streptococcus agalactiae NOT DETECTED NOT DETECTED Final   Streptococcus pneumoniae NOT DETECTED NOT DETECTED Final   Streptococcus pyogenes NOT DETECTED NOT DETECTED Final   Acinetobacter baumannii NOT DETECTED NOT DETECTED Final   Enterobacteriaceae species DETECTED (A) NOT DETECTED Final    Comment: CRITICAL RESULT CALLED TO, READ BACK BY AND VERIFIED WITH: MATT MCBANE ON 10/21/15 AT 0005 [place]    Enterobacter cloacae complex NOT DETECTED NOT DETECTED Final   Escherichia coli NOT DETECTED NOT DETECTED Final   Klebsiella oxytoca NOT DETECTED NOT DETECTED Final   Klebsiella pneumoniae NOT DETECTED NOT DETECTED Final   Proteus species (A) NOT DETECTED Final    CRITICAL RESULT CALLED TO, READ BACK BY AND VERIFIED WITH:    Comment: MATT MCBANE ON 10/21/15 AT 0005 [place]   Serratia marcescens NOT DETECTED NOT DETECTED Final   Haemophilus influenzae NOT DETECTED NOT DETECTED Final   Neisseria meningitidis NOT DETECTED NOT DETECTED Final   Pseudomonas aeruginosa NOT DETECTED NOT DETECTED Final   Candida albicans NOT DETECTED NOT DETECTED Final   Candida glabrata NOT DETECTED NOT DETECTED Final   Candida krusei NOT DETECTED NOT  DETECTED Final   Candida parapsilosis NOT DETECTED NOT DETECTED Final   Candida tropicalis NOT DETECTED NOT DETECTED Final   Carbapenem resistance NOT DETECTED NOT DETECTED Final   Methicillin resistance NOT DETECTED NOT DETECTED Final   Vancomycin resistance NOT DETECTED NOT DETECTED Final  MRSA PCR Screening     Status: None   Collection Time: 10/20/15  7:41 PM  Result Value Ref Range Status   MRSA by PCR NEGATIVE NEGATIVE Final    Comment:        The GeneXpert MRSA Assay (FDA approved for NASAL specimens only), is one component of a comprehensive MRSA colonization surveillance program. It is not intended to diagnose MRSA infection nor to guide or monitor treatment for MRSA infections.   CULTURE, BLOOD (ROUTINE X 2) w Reflex to PCR ID Panel     Status: None   Collection  Time: 10/22/15 11:39 AM  Result Value Ref Range Status   Specimen Description BLOOD LEFT HAND  Final   Special Requests BOTTLES DRAWN AEROBIC AND ANAEROBIC 1CC  Final   Culture NO GROWTH 5 DAYS  Final   Report Status 10/27/2015 FINAL  Final  Culture, expectorated sputum-assessment     Status: None   Collection Time: 10/22/15 12:00 PM  Result Value Ref Range Status   Specimen Description SPUTUM  Final   Special Requests Normal  Final   Sputum evaluation THIS SPECIMEN IS ACCEPTABLE FOR SPUTUM CULTURE  Final   Report Status 10/22/2015 FINAL  Final  Culture, respiratory (NON-Expectorated)     Status: None   Collection Time: 10/22/15 12:00 PM  Result Value Ref Range Status   Specimen Description SPUTUM  Final   Special Requests Normal Reflexed from [id number]  Final   Gram Stain   Final    FAIR SPECIMEN - 70-80% WBCS FEW WBC SEEN RARE YEAST RARE GRAM NEGATIVE RODS    Culture LIGHT GROWTH ENTEROBACTER AEROGENES  Final   Report Status 10/24/2015 FINAL  Final   Organism ID, Bacteria ENTEROBACTER AEROGENES  Final      Susceptibility   Enterobacter aerogenes - MIC*    CEFAZOLIN >=64 RESISTANT Resistant      CEFEPIME <=1 SENSITIVE Sensitive     CEFTAZIDIME <=1 SENSITIVE Sensitive     CEFTRIAXONE <=1 SENSITIVE Sensitive     CIPROFLOXACIN <=0.25 SENSITIVE Sensitive     GENTAMICIN <=1 SENSITIVE Sensitive     IMIPENEM 2 SENSITIVE Sensitive     TRIMETH/SULFA <=20 SENSITIVE Sensitive     PIP/TAZO <=4 SENSITIVE Sensitive     * LIGHT GROWTH ENTEROBACTER AEROGENES  Urine culture     Status: None   Collection Time: 10/22/15 12:45 PM  Result Value Ref Range Status   Specimen Description URINE, RANDOM  Final   Special Requests NONE  Final   Culture NO GROWTH 2 DAYS  Final   Report Status 10/24/2015 FINAL  Final  Urine culture     Status: None   Collection Time: 10/28/15  2:36 PM  Result Value Ref Range Status   Specimen Description URINE, RANDOM  Final   Special Requests NONE  Final   Culture NO GROWTH 2 DAYS  Final   Report Status 10/30/2015 FINAL  Final    Coagulation Studies: No results for input(s): LABPROT, INR in the last 72 hours.  Urinalysis: No results for input(s): COLORURINE, LABSPEC, PHURINE, GLUCOSEU, HGBUR, BILIRUBINUR, KETONESUR, PROTEINUR, UROBILINOGEN, NITRITE, LEUKOCYTESUR in the last 72 hours.  Invalid input(s): APPERANCEUR    Imaging: Dg Chest 1 View  11/01/2015  CLINICAL DATA:  70 year old female with acute shortness of Breath, found unresponsive at the end of January. Sepsis. Initial encounter. EXAM: CHEST 1 VIEW COMPARISON:  10/31/2015 and earlier. FINDINGS: Portable AP semi upright view at 1204 hours. Large body habitus. Bilateral IJ central lines appear stable. No endotracheal tube or enteric tube. Since 10/20/2015 bilateral small pleural effusions have developed but are stable to mildly improved since yesterday, and improved since 10/30/2015. No pneumothorax. Stable pulmonary vascularity without overt edema. Stable cardiac size and mediastinal contours. No air bronchograms identified to suggest consolidation. IMPRESSION: Bilateral pleural effusions appear small, and  somewhat improved since 10/30/2015. Electronically Signed   By: Genevie Ann M.D.   On: 11/01/2015 13:48   US Renal  11/02/2015  CLINICAL DATA:  Followup for hydronephrosis. EXAM: RENAL / URINARY TRACT ULTRASOUND COMPLETE COMPARISON:  CT, 10/27/2015.  Renal ultrasound, 10/21/2015.  FINDINGS: Right Kidney: Length: 12.5 cm. Normal parenchymal echogenicity. Mild hydronephrosis similar to the prior ultrasound and CT. No mass or stone. Left Kidney: Length: 12.1 cm. Echogenicity within normal limits. No mass or hydronephrosis visualized. Bladder: Foley catheter in place. Bladder still mildly distended. No bladder mass or stone. IMPRESSION: 1. Mild right hydronephrosis similar to the prior exams. 2. No left hydronephrosis.  No renal masses or stones. Electronically Signed   By: Lajean Manes M.D.   On: 11/02/2015 15:35     Medications:   . dextrose 125 mL/hr at 11/03/15 0600  . norepinephrine (LEVOPHED) Adult infusion Stopped (10/25/15 2259)  . vasopressin (PITRESSIN) infusion - *FOR SHOCK* Stopped (10/23/15 0950)   . antiseptic oral rinse  7 mL Mouth Rinse q12n4p  . ceFEPime (MAXIPIME) IV  2 g Intravenous 3 times per day  . chlorhexidine  15 mL Mouth Rinse BID  . heparin subcutaneous  5,000 Units Subcutaneous Q12H  . insulin aspart  0-5 Units Subcutaneous QHS  . insulin aspart  0-9 Units Subcutaneous TID WC  . insulin glargine  15 Units Subcutaneous Daily  . ipratropium-albuterol  3 mL Nebulization Q6H  . pantoprazole (PROTONIX) IV  40 mg Intravenous Q24H   [DISCONTINUED] acetaminophen **OR** acetaminophen, bisacodyl, iohexol, labetalol, LORazepam, morphine injection, ondansetron (ZOFRAN) IV, prochlorperazine, sodium chloride flush  Assessment/ Plan:  [name] is a 70 y.o. white female with DM (A1c 7.7%),  who was admitted to [facility] on 10/20/2015  1. Acute renal failure with metabolic acidosis:  - Unknown baseline creatinine. Admit Cr 3.01 Required CRRT from 1/24 to 1/27. Then intermittent  hemodialysis 1/28. Acute renal failure from sepsis leading to ATN. Serologic testing negative 10/21/15 - No new Cr today, will repeat renal function testing.  2. Hypernatremia - from post ATN diuresis - currently on D5W at 125cc/hr, will increase to 150cc/hr.   3. Hypokalemia: post ATN diuresis.  - repeat K today.   4. Sepsis/hypotension secondary to urinary tract infection: proteus - continue cefepime.  5. Anasarca - should improve as renal function improves.   6. 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   7. 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    LOS: [place] 2/7/20177:31 AM

## 2015-11-03 NOTE — Op Note (Signed)
Date of procedure: 11/03/2015  Preoperative diagnosis:  1. Right hydronephrosis 2. Proteus sepsis   Postoperative diagnosis:  1. Same as above   Procedure: 1. Flexible cystoscopy 2. Right ureteral stent placement  Surgeon: Hollice Espy, MD  Anesthesia: Sedation  Complications: None  Intraoperative findings: Patient positioning extremely difficult given habitus and profound anasarca. Ultimately successful with stent placement via flexible cystoscopy.  EBL: Minimal  Specimens: None  Drains: 6 x 24 French double-J Bard Optima stent on the right  Indication: April [name] is a 70 y.o. patient with profound urosepsis admitted to the ICU with a somewhat sluggish recovery. 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 In order to optimize her urinary drainage, cystoscopy, right stent was recommended..  After reviewing the management options for treatment, he elected to proceed with the above surgical procedure(s). We have discussed the potential benefits and risks of the procedure, side effects of the proposed treatment, the likelihood of the patient achieving the goals of the procedure, and any potential problems that might occur during the procedure or recuperation. Informed consent has been obtained.  Description of procedure:  The patient was taken to the operating room and mild sedation was given. Due to the patient's habitus and extreme anasarca, she was unable to placed in the dorsal lithotomy position. We were able to place her left leg somewhat medially in a padded stirrup in order to accommodate a flexible cystoscope only.  She was prepped and draped in the usual sterile fashion, and preoperative antibiotics were administered.  Of note, she did have significant excoriation within her groin area and was massively soiled stool only trace cleaned prior to prepping her. She was also noted to have some vaginal bleeding upon vaginal/urethral  prep. A preoperative time-out was performed.   At this point in time, flexible cystoscope was introduced into the bladder. The bladder was grossly normal with some mild debris.  Attention was turned to the right ureteral orifice which is cannulated using a sensor wire to the level of the kidney under fluoroscopic guidance. Given the technical difficulty of this case, I did not perform a retrograde pyelogram at this time and elected to place a stent only under fluoroscopic guidance to expedite the procedure.  A 6 x 24 French Bard Optima stent was advanced up to the level of the right renal pelvis without difficulty and the wire was withdrawn until a full clot was admitted in the renal pelvis. The wire was then slowly withdrawn a full coils noted within the bladder. An ICU Foley catheter with temperature probe was replaced back into the bladder and the balloon is inflated with 10 cc of sterile water.   The patient was then cleaned and dried.  Hollice Espy, M.D.

## 2015-11-03 NOTE — Anesthesia Preprocedure Evaluation (Addendum)
Anesthesia Evaluation  Patient identified by MRN, date of birth, ID band Patient awake    Reviewed: Allergy & Precautions, H&P , NPO status , Patient's Chart, lab work & pertinent test results, reviewed documented beta blocker date and time   Airway Mallampati: III  TM Distance: >3 FB Neck ROM: full    Dental no notable dental hx. (+) Caps, Teeth Intact   Pulmonary neg pulmonary ROS,  Was recently intubated in the ICU, but respiratory function improved and is now extubated.   Pulmonary exam normal breath sounds clear to auscultation       Cardiovascular Exercise Tolerance: Good (-) hypertension(-) angina+ Past MI  (-) CAD, (-) Cardiac Stents and (-) CABG Normal cardiovascular exam(-) dysrhythmias (-) Valvular Problems/Murmurs Rhythm:regular Rate:Normal     Neuro/Psych negative neurological ROS  negative psych ROS   GI/Hepatic negative GI ROS, Neg liver ROS,   Endo/Other  negative endocrine ROS  Renal/GU ARFRenal disease  negative genitourinary   Musculoskeletal   Abdominal   Peds  Hematology negative hematology ROS (+)   Anesthesia Other Findings History reviewed. No pertinent past medical history.  Admitted 1/24 after being found down likely for 2 days , with ARF, Rhabdo NSTEMI, and sepsis. Has required intubation and HD but now extubated and renal fxn improving. She had Bcx and UCX with proteus and sputum with enterobacter. Clinically improving and extubated.  Reproductive/Obstetrics negative OB ROS                            Anesthesia Physical Anesthesia Plan  ASA: III  Anesthesia Plan: General   Post-op Pain Management:    Induction:   Airway Management Planned:   Additional Equipment:   Intra-op Plan:   Post-operative Plan:   Informed Consent: I have reviewed the patients History and Physical, chart, labs and discussed the procedure including the risks, benefits and  alternatives for the proposed anesthesia with the patient or authorized representative who has indicated his/her understanding and acceptance.   Dental Advisory Given  Plan Discussed with: Anesthesiologist, CRNA and Surgeon  Anesthesia Plan Comments:         Anesthesia Quick Evaluation

## 2015-11-03 NOTE — Progress Notes (Signed)
Urology Consult Follow Up  Subjective: Discussed case with dr. Junious Silk yesterday.  Probable R UJP, plan for R ureteral stent to possibly help expedite recovery with maximal urinary drainage.  Discussed plan with patient including risks/ beneftis in detail.  She is agreeable.  Plan for stent later today.    Anti-infectives: Anti-infectives    Start     Dose/Rate Route Frequency Ordered Stop   11/02/15 2200  ceFEPIme (MAXIPIME) 2 g in dextrose 5 % 50 mL IVPB    Comments:  Pharmacy please adjust dosing as needed.   2 g 100 mL/hr over 30 Minutes Intravenous 3 times per day 11/02/15 1257     10/28/15 2200  ceFEPIme (MAXIPIME) 2 g in dextrose 5 % 50 mL IVPB  Status:  Discontinued    Comments:  Pharmacy please adjust dosing as needed.   2 g 100 mL/hr over 30 Minutes Intravenous Every 12 hours 10/28/15 1420 11/02/15 1257   10/28/15 1000  ceFEPIme (MAXIPIME) 1 g in dextrose 5 % 50 mL IVPB  Status:  Discontinued    Comments:  Pharmacy please adjust dosing as needed.   1 g 100 mL/hr over 30 Minutes Intravenous Every 12 hours 10/28/15 0845 10/28/15 1420   10/26/15 1800  cefTRIAXone (ROCEPHIN) 2 g in dextrose 5 % 50 mL IVPB  Status:  Discontinued     2 g 100 mL/hr over 30 Minutes Intravenous Every 24 hours 10/26/15 1118 10/28/15 1132   10/25/15 1800  meropenem (MERREM) 500 mg in sodium chloride 0.9 % 50 mL IVPB  Status:  Discontinued     500 mg 100 mL/hr over 30 Minutes Intravenous Every 24 hours 10/24/15 1025 10/26/15 1050   10/23/15 1230  meropenem (MERREM) 1 g in sodium chloride 0.9 % 100 mL IVPB  Status:  Discontinued     1 g 200 mL/hr over 30 Minutes Intravenous Every 12 hours 10/23/15 1140 10/24/15 1025   10/22/15 1800  cefTRIAXone (ROCEPHIN) 2 g in dextrose 5 % 50 mL IVPB  Status:  Discontinued     2 g 100 mL/hr over 30 Minutes Intravenous Every 24 hours 10/21/15 1115 10/23/15 1136   10/21/15 1600  cefTRIAXone (ROCEPHIN) 2 g in dextrose 5 % 50 mL IVPB     2 g 100 mL/hr over 30 Minutes  Intravenous  Once 10/21/15 1115 10/21/15 1714   10/20/15 1900  piperacillin-tazobactam (ZOSYN) IVPB 3.375 g  Status:  Discontinued     3.375 g 12.5 mL/hr over 240 Minutes Intravenous 3 times per day 10/20/15 1445 10/21/15 1051   10/20/15 1500  vancomycin (VANCOCIN) 1,500 mg in sodium chloride 0.9 % 500 mL IVPB     1,500 mg 250 mL/hr over 120 Minutes Intravenous STAT 10/20/15 1450 10/20/15 1710   10/20/15 1445  vancomycin (VANCOCIN) IVPB 1000 mg/200 mL premix  Status:  Discontinued     1,000 mg 200 mL/hr over 60 Minutes Intravenous STAT 10/20/15 1441 10/20/15 1450   10/20/15 1100  vancomycin (VANCOCIN) IVPB 1000 mg/200 mL premix     1,000 mg 200 mL/hr over 60 Minutes Intravenous  Once 10/20/15 1053 10/20/15 1203   10/20/15 1100  piperacillin-tazobactam (ZOSYN) IVPB 3.375 g     3.375 g 12.5 mL/hr over 240 Minutes Intravenous  Once 10/20/15 1053 10/20/15 1139      Current Facility-Administered Medications  Medication Dose Route Frequency Provider Last Rate Last Dose  . acetaminophen (TYLENOL) suppository 650 mg  650 mg Rectal Q6H PRN Fritzi Mandes, MD   650 mg at  10/30/15 1817  . antiseptic oral rinse (CPC / CETYLPYRIDINIUM CHLORIDE 0.05%) solution 7 mL  7 mL Mouth Rinse q12n4p Loletha Grayer, MD   7 mL at 11/02/15 1600  . bisacodyl (DULCOLAX) suppository 10 mg  10 mg Rectal Daily PRN Colleen Can, MD      . ceFEPIme (MAXIPIME) 2 g in dextrose 5 % 50 mL IVPB  2 g Intravenous 3 times per day Charlett Nose, RPH   2 g at 11/03/15 8469  . chlorhexidine (PERIDEX) 0.12 % solution 15 mL  15 mL Mouth Rinse BID Loletha Grayer, MD   15 mL at 11/02/15 2338  . dextrose 5 % solution   Intravenous Continuous Munsoor Lateef, MD 150 mL/hr at 11/03/15 0801    . heparin injection 5,000 Units  5,000 Units Subcutaneous Q12H Anders Simmonds, MD   5,000 Units at 11/02/15 2338  . insulin aspart (novoLOG) injection 0-5 Units  0-5 Units Subcutaneous QHS Loletha Grayer, MD   0 Units at 11/02/15 2307  .  insulin aspart (novoLOG) injection 0-9 Units  0-9 Units Subcutaneous TID WC Loletha Grayer, MD   3 Units at 11/03/15 0801  . insulin glargine (LANTUS) injection 15 Units  15 Units Subcutaneous Daily Loletha Grayer, MD   15 Units at 11/02/15 1405  . iohexol (OMNIPAQUE) 240 MG/ML injection 50 mL  50 mL Oral Once PRN Lavonia Dana, MD      . ipratropium-albuterol (DUONEB) 0.5-2.5 (3) MG/3ML nebulizer solution 3 mL  3 mL Nebulization Q6H Tanda Rockers, MD   3 mL at 11/03/15 0806  . labetalol (NORMODYNE,TRANDATE) injection 10 mg  10 mg Intravenous Q2H PRN Lance Coon, MD   10 mg at 10/31/15 6295  . LORazepam (ATIVAN) injection 0.5 mg  0.5 mg Intravenous Q4H PRN Colleen Can, MD   0.5 mg at 11/02/15 2308  . morphine 2 MG/ML injection 2 mg  2 mg Intravenous Q4H PRN Colleen Can, MD   2 mg at 11/02/15 1935  . norepinephrine (LEVOPHED) 16 mg in dextrose 5 % 250 mL (0.064 mg/mL) infusion  0-70 mcg/min Intravenous Titrated Anders Simmonds, MD   Stopped at 10/25/15 2259  . ondansetron (ZOFRAN) injection 4 mg  4 mg Intravenous Q6H PRN Colleen Can, MD      . pantoprazole (PROTONIX) injection 40 mg  40 mg Intravenous Q24H Colleen Can, MD   40 mg at 11/02/15 1730  . potassium chloride 10 mEq in 100 mL IVPB  10 mEq Intravenous Q1 Hr x 6 Richard Baldwin, MD      . prochlorperazine (COMPAZINE) suppository 25 mg  25 mg Rectal Q8H PRN Colleen Can, MD      . sodium chloride flush (NS) 0.9 % injection 10-40 mL  10-40 mL Intracatheter PRN Laverle Hobby, MD      . vasopressin (PITRESSIN) 40 Units in sodium chloride 0.9 % 250 mL (0.16 Units/mL) infusion  0.03 Units/min Intravenous Continuous Rush Farmer, MD   Stopped at 10/23/15 0950     Objective: Vital signs in last 24 hours: Temp:  [98.6 F (37 C)-100.2 F (37.9 C)] 98.6 F (37 C) (02/07 0800) Pulse Rate:  [77-108] 80 (02/07 0800) Resp:  [18-38] 22 (02/07 0800) BP: (100-167)/(36-75) 122/57 mmHg (02/07  0800) SpO2:  [93 %-100 %] 100 % (02/07 0800) FiO2 (%):  [35 %] 35 % (02/06 2039) Weight:  [264 lb 1.8 oz (119.8 kg)] 264 lb 1.8 oz (119.8 kg) (02/07 0500)  Intake/Output from  previous day: 02/06 0701 - 02/07 0700 In: 3108.8 [I.V.:3008.8; IV Piggyback:100] Out: 5885 [Urine:1605] Intake/Output this shift: Total I/O In: 127.1 [I.V.:127.1] Out: -    Physical Exam  General: Alert and oriented to hospital, No acute distress Cardiovascular: Regular rate and rhythm Lungs: Regular rate and effort Abdomen: Soft, nontender, nondistended, no abdominal masses Neurologic: Grossly intact GU: Foley catheter in place-urine clear Ext: Bilateral UE edema  Lab Results:   Recent Labs  11/02/15 0509  WBC 33.5*  HGB 9.3*  HCT 29.1*  PLT 203   BMET  Recent Labs  11/02/15 0509 11/03/15 0539  NA 150* 146*  K 3.5 2.8*  CL 119* 114*  CO2 25 29  GLUCOSE 184* 255*  BUN 30* 28*  CREATININE 0.98 1.06*  CALCIUM 7.7* 7.5*   PT/INR No results for input(s): LABPROT, INR in the last 72 hours. ABG  Recent Labs  11/01/15 1139  PHART 7.47*  HCO3 28.4*    Studies/Results: Dg Chest 1 View  11/01/2015  CLINICAL DATA:  70 year old female with acute shortness of Breath, found unresponsive at the end of January. Sepsis. Initial encounter. EXAM: CHEST 1 VIEW COMPARISON:  10/31/2015 and earlier. FINDINGS: Portable AP semi upright view at 1204 hours. Large body habitus. Bilateral IJ central lines appear stable. No endotracheal tube or enteric tube. Since 10/20/2015 bilateral small pleural effusions have developed but are stable to mildly improved since yesterday, and improved since 10/30/2015. No pneumothorax. Stable pulmonary vascularity without overt edema. Stable cardiac size and mediastinal contours. No air bronchograms identified to suggest consolidation. IMPRESSION: Bilateral pleural effusions appear small, and somewhat improved since 10/30/2015. Electronically Signed   By: Genevie Ann M.D.   On:  11/01/2015 13:48   US Renal  11/02/2015  CLINICAL DATA:  Followup for hydronephrosis. EXAM: RENAL / URINARY TRACT ULTRASOUND COMPLETE COMPARISON:  CT, 10/27/2015.  Renal ultrasound, 10/21/2015. FINDINGS: Right Kidney: Length: 12.5 cm. Normal parenchymal echogenicity. Mild hydronephrosis similar to the prior ultrasound and CT. No mass or stone. Left Kidney: Length: 12.1 cm. Echogenicity within normal limits. No mass or hydronephrosis visualized. Bladder: Foley catheter in place. Bladder still mildly distended. No bladder mass or stone. IMPRESSION: 1. Mild right hydronephrosis similar to the prior exams. 2. No left hydronephrosis.  No renal masses or stones. Electronically Signed   By: Lajean Manes M.D.   On: 11/02/2015 15:35     Assessment: s/p Procedure(s): CYSTOSCOPY WITH STENT PLACEMENT  Plan: Sepsis-patient recovering but recovery has been somewhat slow. F/u urine culture was negative. Pt still has cognitive deficits that are being evaluated and gangrenous foot/toes.   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 Could have been part of the inciting event. Patient now stable with normal kidney function and urine output.   Plan for cysto, R stent today for maximal urinary drainage to expedite recovery   LOS: 14 days    Hollice Espy 11/03/2015

## 2015-11-03 NOTE — Progress Notes (Signed)
Patient ID: April [name], female   DOB: 24-Nov-1945, 70 y.o.   MRN: [id number] Patient ID: April [name], female   DOB: [birth year], 70 y.o.   MRN: [id number] [facility] PROGRESS NOTE  April [name] [id number]:[id number] DOB: 02/17/1946 DOA: 10/20/2015 PCP: Albina Billet, MD  HPI/Subjective: Patient answers some yes or no questions. Some stridor with breathing. Short shallow breaths. The patient does not complain of any shortness of breath or cough.  Objective: Filed Vitals:   11/03/15 1000 11/03/15 1100  BP:  124/54  Pulse: 85 77  Temp: 98.6 F (37 C) 98.2 F (36.8 C)  Resp:  22    Filed Weights   11/01/15 0500 11/02/15 0400 11/03/15 0500  Weight: 121 kg (266 lb 12.1 oz) 120.8 kg (266 lb 5.1 oz) 119.8 kg (264 lb 1.8 oz)    ROS: Review of Systems  Constitutional: Positive for fever. Negative for chills.  Eyes: Negative for blurred vision.  Respiratory: Negative for cough and shortness of breath.   Cardiovascular: Negative for chest pain.  Gastrointestinal: Negative for nausea, vomiting, abdominal pain, diarrhea and constipation.  Genitourinary: Negative for dysuria.  Musculoskeletal: Negative for joint pain.  Neurological: Negative for dizziness and headaches.     Exam: Physical Exam  HENT:  Nose: No mucosal edema.  Mouth/Throat: No oropharyngeal exudate or posterior oropharyngeal edema.  Eyes: Conjunctivae, EOM and lids are normal. Pupils are equal, round, and reactive to light.  Neck: No JVD present. Carotid bruit is not present. No edema present. No thyroid mass and no thyromegaly present.  Cardiovascular: S1 normal and S2 normal.  Exam reveals no gallop.   No murmur heard. Pulses:      Dorsalis pedis pulses are 2+ on the right side, and 2+ on the left side.  Respiratory: She has decreased breath sounds in the right middle field, the right lower field, the left middle field and the left lower field. She has no wheezes. She has no rhonchi. She has rales in the right  lower field and the left lower field.  Upper airway stridor  GI: Soft. Bowel sounds are normal. There is no tenderness.  Musculoskeletal:       Right ankle: She exhibits swelling.       Left ankle: She exhibits swelling.  Lymphadenopathy:    She has no cervical adenopathy.  Neurological: She is alert.  Just barely able to lift arms up off the bed.  Skin: Skin is warm. Nails show no clubbing.  Right leg demarcated halfway up the lower leg. Gangrene all the toes and right foot. Left foot demarcated and left toes gangrenous.  Psychiatric: She has a normal mood and affect.      Data Reviewed: Basic Metabolic Panel:  Recent Labs Lab 10/28/15 0443  10/30/15 0431  10/31/15 0649  10/31/15 2124 11/01/15 0455 11/01/15 1300 11/02/15 0509 11/03/15 0539  NA 152*  < > 157*  < >  --   < > 157* 156* 151* 150* 146*  K 3.9  < > 3.7  < >  --   < > 3.4* 3.3* 3.4* 3.5 2.8*  CL 119*  < > 121*  < >  --   < > 122* 123* 121* 119* 114*  CO2 29  < > 31  < >  --   < > '31 30 27 25 29  '$ GLUCOSE 209*  < > 171*  < >  --   < > 247* 144* 185* 184* 255*  BUN 71*  < >  54*  < >  --   < > 40* 39* 34* 30* 28*  CREATININE 1.60*  < > 1.33*  < >  --   < > 1.22* 1.09* 1.12* 0.98 1.06*  CALCIUM 7.7*  < > 8.1*  < >  --   < > 7.8* 7.6* 7.6* 7.7* 7.5*  MG 2.3  --  2.1  --  2.0  --   --  2.0  --   --  1.8  PHOS  --   --  3.3  --   --   --   --  3.2  --   --  3.6  < > = values in this interval not displayed. Liver Function Tests:  Recent Labs Lab 11/02/15 0509 11/03/15 0539  AST 20 20  ALT 29 26  ALKPHOS 80 87  BILITOT 1.0 0.7  PROT 5.8* 5.4*  ALBUMIN 1.7* 1.6*    Recent Labs Lab 10/31/15 0649  AMMONIA 18   CBC:  Recent Labs Lab 10/28/15 0443 10/29/15 0421 10/30/15 0431 10/31/15 0500 11/02/15 0509  WBC 26.2* 31.0* 29.4* 28.4* 33.5*  HGB 9.7* 9.4* 10.0* 9.9* 9.3*  HCT 29.9* 29.0* 30.8* 30.6* 29.1*  MCV 93.0 92.1 94.1 93.7 94.1  PLT 109* 132* 181 211 203   BNP (last 3 results)  Recent Labs   10/31/15 0649  BNP 268.0*     CBG:  Recent Labs Lab 11/02/15 1403 11/02/15 1626 11/02/15 2124 11/03/15 0719 11/03/15 1119  GLUCAP 185* 185* 183* 230* 218*    Recent Results (from the past 240 hour(s))  Urine culture     Status: None   Collection Time: 10/28/15  2:36 PM  Result Value Ref Range Status   Specimen Description URINE, RANDOM  Final   Special Requests NONE  Final   Culture NO GROWTH 2 DAYS  Final   Report Status 10/30/2015 FINAL  Final     Studies: Dg Chest 1 View  11/01/2015  CLINICAL DATA:  70 year old female with acute shortness of Breath, found unresponsive at the end of January. Sepsis. Initial encounter. EXAM: CHEST 1 VIEW COMPARISON:  10/31/2015 and earlier. FINDINGS: Portable AP semi upright view at 1204 hours. Large body habitus. Bilateral IJ central lines appear stable. No endotracheal tube or enteric tube. Since 10/20/2015 bilateral small pleural effusions have developed but are stable to mildly improved since yesterday, and improved since 10/30/2015. No pneumothorax. Stable pulmonary vascularity without overt edema. Stable cardiac size and mediastinal contours. No air bronchograms identified to suggest consolidation. IMPRESSION: Bilateral pleural effusions appear small, and somewhat improved since 10/30/2015. Electronically Signed   By: Genevie Ann M.D.   On: 11/01/2015 13:48   US Renal  11/02/2015  CLINICAL DATA:  Followup for hydronephrosis. EXAM: RENAL / URINARY TRACT ULTRASOUND COMPLETE COMPARISON:  CT, 10/27/2015.  Renal ultrasound, 10/21/2015. FINDINGS: Right Kidney: Length: 12.5 cm. Normal parenchymal echogenicity. Mild hydronephrosis similar to the prior ultrasound and CT. No mass or stone. Left Kidney: Length: 12.1 cm. Echogenicity within normal limits. No mass or hydronephrosis visualized. Bladder: Foley catheter in place. Bladder still mildly distended. No bladder mass or stone. IMPRESSION: 1. Mild right hydronephrosis similar to the prior exams. 2. No left  hydronephrosis.  No renal masses or stones. Electronically Signed   By: Lajean Manes M.D.   On: 11/02/2015 15:35    Scheduled Meds: . antiseptic oral rinse  7 mL Mouth Rinse q12n4p  . ceFEPime (MAXIPIME) IV  2 g Intravenous 3 times per day  .  chlorhexidine  15 mL Mouth Rinse BID  . heparin subcutaneous  5,000 Units Subcutaneous Q12H  . insulin aspart  0-5 Units Subcutaneous QHS  . insulin aspart  0-9 Units Subcutaneous TID WC  . insulin glargine  15 Units Subcutaneous Daily  . ipratropium-albuterol  3 mL Nebulization Q6H  . pantoprazole (PROTONIX) IV  40 mg Intravenous Q24H  . potassium chloride  10 mEq Intravenous Q1 Hr x 6   Continuous Infusions: . dextrose 150 mL/hr at 11/03/15 0801  . norepinephrine (LEVOPHED) Adult infusion Stopped (10/25/15 2259)  . vasopressin (PITRESSIN) infusion - *FOR SHOCK* Stopped (10/23/15 0950)    Assessment/Plan:  1. Septic shock due to Proteus bacteremia. Sputum culture also positive for Enterobacter pneumonia. Patient is on cefepime. Patient still spiking low-grade fevers and has gangrene on toes at this point. Off pressors at this point. Appreciate infectious disease consultation.  Patient still has leukocytosis. 2. Acute renal failure. This has improved likely due to ATN in the setting of sepsis and rhabdomyolysis. Patient was initially on CRRT and then HD and now off. Creatinine improved and dialysis catheter removed 3. Stridor yesterday which has improved. Now breathing more comfortably on nasal cannula. 4. Atrial fibrillation with rapid ventricular response. Heart rate controlled. 5. Type 2 diabetes- patient to be switched to lantus and slinding scale. 6. Elevated liver function tests and jaundice- likely secondary shock liver. iver function tests have normalized. 7. Acute rhabdomyolysis- CPKs have trended better 8. 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 Platelet count has recovered. 9. Acute respiratory failure-  on nasal cannula. 10. Severe hypernatremia- on D5W. Sodium still high today at 150 11. Acute encephalopathy. Patient now answering questions. Currently nothing by mouth for procedure but hopefully can go back on diet afterwards. 12. Gangrene bilateral lower extremities. May end up needing amputations in the future. 48. Right hydronephrosis on previous sonogram. urology to place a stent today. 14. Hypomagnesemia and hypokalemia- electrolyte protocol in the ICU.  Code Status:     Code Status Orders        Start     Ordered   10/30/15 1511  Do not attempt resuscitation (DNR)   Continuous    Question Answer Comment  In the event of cardiac or respiratory ARREST Do not call a "code blue"   In the event of cardiac or respiratory ARREST Do not perform Intubation, CPR, defibrillation or ACLS   In the event of cardiac or respiratory ARREST Use medication by any route, position, wound care, and other measures to relive pain and suffering. May use oxygen, suction and manual treatment of airway obstruction as needed for comfort.   Comments DNR/ DNI      10/30/15 1510    Code Status History    Date Active Date Inactive Code Status Order ID Comments User Context   10/30/2015  2:18 PM 10/30/2015  3:10 PM DNR [id number]  Colleen Can, MD Inpatient   10/20/2015  5:14 PM 10/30/2015  2:18 PM Full Code [id number]  Fritzi Mandes, MD Inpatient     Disposition Plan: To be determined  Consultants:  Nephrology  Neurology  Critical care  Vascular surgery  Urology  Infectious disease  Antibiotics:  Cefepime  Time spent: 25 minutes.   Loletha Grayer  [facility]

## 2015-11-03 NOTE — Care Management (Signed)
Patient has been extubated and has developed stridor.  Placed on humified trach collar mask and nebs.  Remains on IVF for hypernatremia.  Kidney  function normal.  Urology is going to place urinary stent today for hydronephrosis in hopes of increasing urinary output.  WBC remains high.  Left message for attending regarding possible need for consults for neuro (address cognitive deficits), vascular for gangrenous toes, and ENT if stridor continues

## 2015-11-04 ENCOUNTER — Encounter
Admission: EM | Disposition: A | Discharge status: Skilled Nursing Facility | Payer: Self-pay | Source: Home / Self Care | Attending: Specialist

## 2015-11-04 ENCOUNTER — Encounter: Payer: Self-pay | Admitting: *Deleted

## 2015-11-04 ENCOUNTER — Inpatient Hospital Stay: Payer: Medicare HMO

## 2015-11-04 LAB — BASIC METABOLIC PANEL
ANION GAP: 6 (ref 5–15)
BUN: 23 mg/dL — AB (ref 6–20)
CO2: 24 mmol/L (ref 22–32)
Calcium: 7.4 mg/dL — ABNORMAL LOW (ref 8.9–10.3)
Chloride: 111 mmol/L (ref 101–111)
Creatinine, Ser: 0.98 mg/dL (ref 0.44–1.00)
GFR calc Af Amer: >60 mL/min (ref >60)
GFR, EST NON AFRICAN AMERICAN: 58 mL/min — AB (ref >60)
Glucose, Bld: 192 mg/dL — ABNORMAL HIGH (ref 65–99)
POTASSIUM: 2.9 mmol/L — AB (ref 3.5–5.1)
SODIUM: 141 mmol/L (ref 135–145)

## 2015-11-04 LAB — MAGNESIUM: MAGNESIUM: 2 mg/dL (ref 1.7–2.4)

## 2015-11-04 LAB — GLUCOSE, CAPILLARY
GLUCOSE-CAPILLARY: 147 mg/dL — AB (ref 65–99)
GLUCOSE-CAPILLARY: 142 mg/dL — AB (ref 65–99)
Glucose-Capillary: 155 mg/dL — ABNORMAL HIGH (ref 65–99)
Glucose-Capillary: 111 mg/dL — ABNORMAL HIGH (ref 65–99)

## 2015-11-04 LAB — CBC
HCT: 25.7 % — ABNORMAL LOW (ref 35.0–47.0)
Hemoglobin: 8.5 g/dL — ABNORMAL LOW (ref 12.0–16.0)
MCH: 30 pg (ref 26.0–34.0)
MCHC: 33 g/dL (ref 32.0–36.0)
MCV: 91.1 fL (ref 80.0–100.0)
PLATELETS: 187 10*3/uL (ref 150–440)
RBC: 2.82 MIL/uL — AB (ref 3.80–5.20)
RDW: 14.4 % (ref 11.5–14.5)
WBC: 18.4 10*3/uL — AB (ref 3.6–11.0)

## 2015-11-04 LAB — POTASSIUM: POTASSIUM: 3.3 mmol/L — AB (ref 3.5–5.1)

## 2015-11-04 LAB — PHOSPHORUS: Phosphorus: 2.8 mg/dL (ref 2.5–4.6)

## 2015-11-04 SURGERY — CYSTOSCOPY, WITH STENT INSERTION
Anesthesia: Choice | Laterality: Right

## 2015-11-04 MED ORDER — CEFTRIAXONE SODIUM 2 G IJ SOLR
2.0000 g | INTRAMUSCULAR | Status: DC
  Administered 2015-11-04 – 2015-11-05 (×2): 2 g via INTRAVENOUS
  Filled 2015-11-04 (×3): qty 2

## 2015-11-04 MED ORDER — POTASSIUM CHLORIDE 10 MEQ/100ML IV SOLN
10.0000 meq | INTRAVENOUS | Status: AC
Start: 2015-11-05 — End: 2015-11-05
  Administered 2015-11-05 (×2): 10 meq via INTRAVENOUS
  Filled 2015-11-04 (×2): qty 100

## 2015-11-04 MED ORDER — CETYLPYRIDINIUM CHLORIDE 0.05 % MT LIQD
7.0000 mL | Freq: Two times a day (BID) | OROMUCOSAL | Status: DC
  Administered 2015-11-04 – 2015-11-09 (×9): 7 mL via OROMUCOSAL

## 2015-11-04 MED ORDER — ENOXAPARIN SODIUM 40 MG/0.4ML ~~LOC~~ SOLN
40.0000 mg | Freq: Two times a day (BID) | SUBCUTANEOUS | Status: DC
  Administered 2015-11-04 – 2015-11-09 (×10): 40 mg via SUBCUTANEOUS
  Filled 2015-11-04 (×10): qty 0.4

## 2015-11-04 MED ORDER — POTASSIUM CHLORIDE CRYS ER 20 MEQ PO TBCR
40.0000 meq | EXTENDED_RELEASE_TABLET | Freq: Once | ORAL | Status: DC
  Filled 2015-11-04: qty 2

## 2015-11-04 MED ORDER — POTASSIUM CHLORIDE 10 MEQ/50ML IV SOLN
10.0000 meq | INTRAVENOUS | Status: DC
  Administered 2015-11-04 (×3): 10 meq via INTRAVENOUS
  Filled 2015-11-04 (×8): qty 50

## 2015-11-04 NOTE — Progress Notes (Signed)
Pt tx per MD MD order, pt VSS, pt verbalized understanding of tx, family at [facility], report called to receiving RN, all questions answered

## 2015-11-04 NOTE — Progress Notes (Signed)
Speech Language Pathology Treatment: Dysphagia  Patient Details Name: April [name] MRN: [id number] DOB: 1946/04/10 Today's Date: 11/04/2015 Time: 0930-1030 SLP Time Calculation (min) (ACUTE ONLY): 60 min  Assessment / Plan / Recommendation Clinical Impression  Pt appeared to tolerate trials of thin liquids via cup only and purees w/ no immediate, overt s/s of aspiration noted. Pt exhibited no overt coughing but audible swallows and a delayed cough was noted x1 post trials of thin liquids; no decline in O2 sats during po's. Noted min. Increased respiratory effort and RR to the upper 20's w/ trials - rest breaks given every 2-3 boluses to which pt responded well. During the oral phase, pt exhibited appropriate bolus management and transit time w/ puree trials. No trials of solids were assessed at this eval. Pt appears at min. increased risk for aspiration sec. to her baseline respiratory effort and fatigue. Rec. a conservative Dys. 1 diet w/ thin liquids via cup (NO Straws) w/ aspiration precautions; meds in puree; feeding assistance at meals. NSG updated.     HPI HPI: Pt is a 70 y.o. female with no past medical history comes to the emergency room after she was found lying on the floor minimally responsive. Patient had not shown up to work for 2 days and coworkers checked on her and they found her lying on the floor. Down time not known. She was reportedly severely swollen at baseline by coworker. Pt was emergently intubated upon arrival to the ED. She has since been extubated on Feb. 2 but has required BiPAP support intermittently and has not been able to participate in a BSE. There is some question that pt is not at her baseline mental status as well. Pt continues to have respiratory issues requiring increased O2 support per RT and NSG. She had stents placed in the kidneys yesterday per chart notes. At rest, pt exhibits min. increased respiratory effort w/ exertion: O2 sats 94%, RR low 20's. Pt followed  basic commands; conversed about self and preferences. Mild cough but more productive at times.       SLP Plan  Goals updated     Recommendations  Diet recommendations: Dysphagia 1 (puree);Thin liquid Liquids provided via: Cup;No straw Medication Administration: Crushed with puree Supervision: Staff to assist with self feeding;Full supervision/cueing for compensatory strategies Compensations: Minimize environmental distractions;Slow rate;Small sips/bites;Follow solids with liquid (rest breaks during po's) Postural Changes and/or Swallow Maneuvers: Seated upright 90 degrees             General recommendations:  (TBD) Oral Care Recommendations: Oral care BID;Staff/trained caregiver to provide oral care Follow up Recommendations: Skilled Nursing facility (TBD) Plan: Goals updated     [place], MS, CCC-SLP  Watson,Katherine 11/04/2015, 11:11 AM

## 2015-11-04 NOTE — Progress Notes (Signed)
Patient ID: April [name], female   DOB: Sep 12, 1946, 70 y.o.   MRN: [id number] [facility] PROGRESS NOTE  April [name] PXT:[id number] DOB: 01/30/1946 DOA: 10/20/2015 PCP: Albina Billet, MD  HPI/Subjective: Patient feels okay. She never offers any complaints. Still having low-grade fever  Objective: Filed Vitals:   11/04/15 1200 11/04/15 1300  BP: 118/62 139/52  Pulse: 85 89  Temp: 99.3 F (37.4 C) 99.5 F (37.5 C)  Resp: 25 25    Filed Weights   11/02/15 0400 11/03/15 0500 11/04/15 0500  Weight: 120.8 kg (266 lb 5.1 oz) 119.8 kg (264 lb 1.8 oz) 130 kg (286 lb 9.6 oz)    ROS: Review of Systems  Constitutional: Positive for fever.  Eyes: Negative for blurred vision.  Respiratory: Negative for cough and shortness of breath.   Cardiovascular: Negative for chest pain.  Gastrointestinal: Negative for nausea, vomiting, abdominal pain, diarrhea and constipation.  Genitourinary: Negative for dysuria.  Musculoskeletal: Negative for joint pain.  Neurological: Negative for dizziness and headaches.     Exam: Physical Exam  HENT:  Nose: No mucosal edema.  Mouth/Throat: No oropharyngeal exudate or posterior oropharyngeal edema.  Eyes: Conjunctivae, EOM and lids are normal. Pupils are equal, round, and reactive to light.  Neck: No JVD present. Carotid bruit is not present. No edema present. No thyroid mass and no thyromegaly present.  Cardiovascular: S1 normal and S2 normal.  Exam reveals no gallop.   No murmur heard. Pulses:      Dorsalis pedis pulses are 2+ on the right side, and 2+ on the left side.  Respiratory: She has decreased breath sounds in the right lower field and the left lower field. She has no wheezes. She has no rhonchi. She has rales in the right lower field and the left lower field.  GI: Soft. Bowel sounds are normal. There is no tenderness.  Musculoskeletal:       Right ankle: She exhibits swelling.       Left ankle: She exhibits swelling.   Lymphadenopathy:    She has no cervical adenopathy.  Neurological: She is alert.  Just barely able to lift arms up off the bed.  Skin: Skin is warm. Nails show no clubbing.  Right leg demarcated halfway up the lower leg. Gangrene all the toes and right foot. Left foot demarcated and left toes gangrenous.  Psychiatric: She has a normal mood and affect.      Data Reviewed: Basic Metabolic Panel:  Recent Labs Lab 10/30/15 0431  10/31/15 0649  11/01/15 0455 11/01/15 1300 11/02/15 0509 11/03/15 0539 11/03/15 1245 11/03/15 1715 11/04/15 0533  NA 157*  < >  --   < > 156* 151* 150* 146*  --   --  141  K 3.7  < >  --   < > 3.3* 3.4* 3.5 2.8* 3.3* 3.4* 2.9*  CL 121*  < >  --   < > 123* 121* 119* 114*  --   --  111  CO2 31  < >  --   < > '30 27 25 29  '$ --   --  24  GLUCOSE 171*  < >  --   < > 144* 185* 184* 255*  --   --  192*  BUN 54*  < >  --   < > 39* 34* 30* 28*  --   --  23*  CREATININE 1.33*  < >  --   < > 1.09* 1.12* 0.98 1.06*  --   --  0.98  CALCIUM 8.1*  < >  --   < > 7.6* 7.6* 7.7* 7.5*  --   --  7.4*  MG 2.1  --  2.0  --  2.0  --   --  1.8  --   --  2.0  PHOS 3.3  --   --   --  3.2  --   --  3.6  --   --  2.8  < > = values in this interval not displayed. Liver Function Tests:  Recent Labs Lab 11/02/15 0509 11/03/15 0539  AST 20 20  ALT 29 26  ALKPHOS 80 87  BILITOT 1.0 0.7  PROT 5.8* 5.4*  ALBUMIN 1.7* 1.6*    Recent Labs Lab 10/31/15 0649  AMMONIA 18   CBC:  Recent Labs Lab 10/29/15 0421 10/30/15 0431 10/31/15 0500 11/02/15 0509 11/04/15 0533  WBC 31.0* 29.4* 28.4* 33.5* 18.4*  HGB 9.4* 10.0* 9.9* 9.3* 8.5*  HCT 29.0* 30.8* 30.6* 29.1* 25.7*  MCV 92.1 94.1 93.7 94.1 91.1  PLT 132* 181 211 203 187   BNP (last 3 results)  Recent Labs  10/31/15 0649  BNP 268.0*     CBG:  Recent Labs Lab 11/03/15 1119 11/03/15 1552 11/03/15 2124 11/04/15 0800 11/04/15 1118  GLUCAP 218* 171* 155* 155* 147*    Recent Results (from the past 240  hour(s))  Urine culture     Status: None   Collection Time: 10/28/15  2:36 PM  Result Value Ref Range Status   Specimen Description URINE, RANDOM  Final   Special Requests NONE  Final   Culture NO GROWTH 2 DAYS  Final   Report Status 10/30/2015 FINAL  Final     Studies: US Renal  11/02/2015  CLINICAL DATA:  Followup for hydronephrosis. EXAM: RENAL / URINARY TRACT ULTRASOUND COMPLETE COMPARISON:  CT, 10/27/2015.  Renal ultrasound, 10/21/2015. FINDINGS: Right Kidney: Length: 12.5 cm. Normal parenchymal echogenicity. Mild hydronephrosis similar to the prior ultrasound and CT. No mass or stone. Left Kidney: Length: 12.1 cm. Echogenicity within normal limits. No mass or hydronephrosis visualized. Bladder: Foley catheter in place. Bladder still mildly distended. No bladder mass or stone. IMPRESSION: 1. Mild right hydronephrosis similar to the prior exams. 2. No left hydronephrosis.  No renal masses or stones. Electronically Signed   By: Lajean Manes M.D.   On: 11/02/2015 15:35   Dg Chest Port 1 View  11/04/2015  CLINICAL DATA:  PICC line insertion. EXAM: PORTABLE CHEST 1 VIEW COMPARISON:  Chest x-ray dated 11/01/2015. FINDINGS: Left-sided central line in place with tip stable in position at the upper margin of the SVC. New right-sided PICC line now in place with tip well-positioned in the lower SVC. Cardiomediastinal silhouette is stable in size and configuration. Central pulmonary vascular congestion and bilateral interstitial edema persists, but decreased compared to the previous exam. Opacities at each lung base most likely representing a combination of small pleural effusions and atelectasis. IMPRESSION: 1. Right-sided PICC line placement with tip well-positioned in the lower SVC, near the expected level of the cavoatrial junction. 2. Left-sided central line stable in position with tip at the upper margin of the SVC. 3. Persistent central pulmonary vascular congestion and bilateral interstitial edema,  but improved compared to the previous exam suggesting improved fluid status. 4. Probable small bilateral pleural effusions and bibasilar atelectasis. Electronically Signed   By: Franki Cabot M.D.   On: 11/04/2015 14:37    Scheduled Meds: . antiseptic oral rinse  7 mL Mouth  Rinse q12n4p  . ceFEPime (MAXIPIME) IV  2 g Intravenous 3 times per day  . chlorhexidine  15 mL Mouth Rinse BID  . enoxaparin (LOVENOX) injection  40 mg Subcutaneous Q12H  . insulin aspart  0-5 Units Subcutaneous QHS  . insulin aspart  0-9 Units Subcutaneous TID WC  . insulin glargine  15 Units Subcutaneous Daily  . ipratropium-albuterol  3 mL Nebulization Q6H  . potassium chloride  40 mEq Oral Once    Assessment/Plan:  1. Septic shock due to Proteus bacteremia. Sputum culture also positive for Enterobacter pneumonia. Patient is on cefepime. Patient still spiking low-grade fevers and has gangrene on toes at this point. Off pressors at this point. Appreciate infectious disease consultation.  Patient still has leukocytosis. 2. Acute renal failure. This has improved likely due to ATN in the setting of sepsis and rhabdomyolysis. Improved to normal range. Dialysis catheter removed. 3. Atrial fibrillation with rapid ventricular response. Heart rate controlled. 4. Type 2 diabetes- patient to be switched to lantus and slinding scale. 5. Elevated liver function tests and jaundice- likely secondary shock liver. Liver function tests have normalized. 6. Acute rhabdomyolysis- CPKs have trended better 7. 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 Platelet count has recovered. 8. Acute respiratory failure- on nasal cannula. 9. Severe hypernatremia- sodium has improved. 10. Acute encephalopathy. Patient now answering questions.  11. Gangrene bilateral lower extremities. May end up needing amputations in the future. 12. Right hydronephrosis on previous sonogram. urology placed a stent  yesterday 13. Hypomagnesemia and hypokalemia- electrolytes have been being replaced on a daily basis 14. Weakness- we'll get physical therapy evaluation. Spoke with care manager about possible LTAC referral but the patient will need 21 days in the hospital first. 15. Transfer out of the ICU  Code Status:     Code Status Orders        Start     Ordered   10/30/15 1511  Do not attempt resuscitation (DNR)   Continuous    Question Answer Comment  In the event of cardiac or respiratory ARREST Do not call a "code blue"   In the event of cardiac or respiratory ARREST Do not perform Intubation, CPR, defibrillation or ACLS   In the event of cardiac or respiratory ARREST Use medication by any route, position, wound care, and other measures to relive pain and suffering. May use oxygen, suction and manual treatment of airway obstruction as needed for comfort.   Comments DNR/ DNI      10/30/15 1510    Code Status History    Date Active Date Inactive Code Status Order ID Comments User Context   10/30/2015  2:18 PM 10/30/2015  3:10 PM DNR [id number]  Colleen Can, MD Inpatient   10/20/2015  5:14 PM 10/30/2015  2:18 PM Full Code [id number]  Fritzi Mandes, MD Inpatient     Disposition Plan: To be determined  Consultants:  Nephrology  Neurology  Critical care  Vascular surgery  Urology  Infectious disease  Antibiotics:  Cefepime  Time spent: 22 minutes.   Loletha Grayer  [facility]

## 2015-11-04 NOTE — Progress Notes (Addendum)
[place] NOTE  Pharmacy Consult for Cefepime Dosing, Electrolyte Management, Constipation Prevention Indication: HD/ Proteus Bacteremia     Allergies  Allergen Reactions  . Prednisone Anaphylaxis    Patient Measurements: Height: '5\' 7"'$  (170.2 cm) Weight: 286 lb 9.6 oz (130 kg) IBW/kg (Calculated) : 61.6   Vital Signs: Temp: 99.1 F (37.3 C) (02/08 0900) Temp Source: Core (Comment) (02/08 0400) BP: 135/63 mmHg (02/08 0800) Pulse Rate: 87 (02/08 0900) Intake/Output from previous day: 02/07 0701 - 02/08 0700 In: 2724.6 [I.V.:2074.6; IV Piggyback:650] Out: 2690 [Urine:2290; Stool:400] Intake/Output from this shift:   Vent settings for last 24 hours:    Labs:  Recent Labs  11/02/15 0509 11/03/15 0539 11/04/15 0533  WBC 33.5*  --  18.4*  HGB 9.3*  --  8.5*  HCT 29.1*  --  25.7*  PLT 203  --  187  CREATININE 0.98 1.06* 0.98  MG  --  1.8 2.0  PHOS  --  3.6 2.8  ALBUMIN 1.7* 1.6*  --   PROT 5.8* 5.4*  --   AST 20 20  --   ALT 29 26  --   ALKPHOS 80 87  --   BILITOT 1.0 0.7  --    Estimated Creatinine Clearance: 76.1 mL/min (by C-G formula based on Cr of 0.98).   Recent Labs  11/03/15 1552 11/03/15 2124 11/04/15 0800  GLUCAP 171* 155* 155*    Microbiology: Recent Results (from the past 720 hour(s))  Urine culture     Status: None   Collection Time: 10/20/15  9:57 AM  Result Value Ref Range Status   Specimen Description URINE, RANDOM  Final   Special Requests NONE  Final   Culture >=100,000 COLONIES/mL PROTEUS MIRABILIS  Final   Report Status 10/22/2015 FINAL  Final   Organism ID, Bacteria PROTEUS MIRABILIS  Final      Susceptibility   Proteus mirabilis - MIC*    AMPICILLIN <=2 SENSITIVE Sensitive     GENTAMICIN <=1 SENSITIVE Sensitive     CEFTRIAXONE Value in next row Sensitive      SENSITIVE<=1    CIPROFLOXACIN Value in next row Sensitive      SENSITIVE<=0.25    IMIPENEM Value in next row Sensitive      SENSITIVE2     NITROFURANTOIN Value in next row Resistant      RESISTANT128    TRIMETH/SULFA Value in next row Sensitive      SENSITIVE<=20    PIP/TAZO Value in next row Sensitive      SENSITIVE<=4    AMPICILLIN/SULBACTAM Value in next row Sensitive      SENSITIVE<=2    * >=100,000 COLONIES/mL PROTEUS MIRABILIS  Culture, blood (routine x 2)     Status: None   Collection Time: 10/20/15  9:57 AM  Result Value Ref Range Status   Specimen Description BLOOD RIGHT WRIST  Final   Special Requests   Final    BOTTLES DRAWN AEROBIC AND ANAEROBIC ANA 1ML AER 4ML   Culture  Setup Time   Final    GRAM NEGATIVE RODS IN BOTH AEROBIC AND ANAEROBIC BOTTLES CRITICAL RESULT CALLED TO, READ BACK BY AND VERIFIED WITH: April April [name] ON 10/21/15 AT 0005 [facility] CONFIRMED BY [name]    Culture   Final    PROTEUS MIRABILIS IN BOTH AEROBIC AND ANAEROBIC BOTTLES    Report Status 10/22/2015 FINAL  Final   Organism ID, Bacteria PROTEUS MIRABILIS  Final      Susceptibility  Proteus mirabilis - MIC*    AMPICILLIN/SULBACTAM Value in next row Sensitive      SENSITIVE<=2    PIP/TAZO Value in next row Sensitive      SENSITIVE<=4    CEFTRIAXONE Value in next row Sensitive      SENSITIVE<=1    IMIPENEM Value in next row Sensitive      SENSITIVE2    GENTAMICIN Value in next row Sensitive      SENSITIVE<=1    CIPROFLOXACIN Value in next row Sensitive      SENSITIVE<=0.25    * PROTEUS MIRABILIS  Culture, blood (routine x 2)     Status: None   Collection Time: 10/20/15  9:57 AM  Result Value Ref Range Status   Specimen Description BLOOD LEFT HAND  Final   Special Requests   Final    BOTTLES DRAWN AEROBIC AND ANAEROBIC AER 3ML ANA 2ML   Culture  Setup Time   Final    GRAM NEGATIVE RODS IN BOTH AEROBIC AND ANAEROBIC BOTTLES CRITICAL VALUE NOTED.  VALUE IS CONSISTENT WITH PREVIOUSLY REPORTED AND CALLED VALUE.    Culture   Final    PROTEUS MIRABILIS IN BOTH AEROBIC AND ANAEROBIC BOTTLES    Report Status 10/22/2015 FINAL  Final    Organism ID, Bacteria PROTEUS MIRABILIS  Final      Susceptibility   Proteus mirabilis - MIC*    AMPICILLIN Value in next row Sensitive      SENSITIVE<=2    PIP/TAZO Value in next row Sensitive      SENSITIVE<=4    CEFTAZIDIME Value in next row Sensitive      SENSITIVE<=1    CEFTRIAXONE Value in next row Sensitive      SENSITIVE<=1    CEFEPIME Value in next row Sensitive      SENSITIVE<=1    IMIPENEM Value in next row Sensitive      SENSITIVE2    GENTAMICIN Value in next row Sensitive      SENSITIVE<=1    CIPROFLOXACIN Value in next row Sensitive      SENSITIVE<=0.25    * PROTEUS MIRABILIS  Blood Culture ID Panel (Reflexed)     Status: Abnormal   Collection Time: 10/20/15  9:57 AM  Result Value Ref Range Status   Enterococcus species NOT DETECTED NOT DETECTED Final   Listeria monocytogenes NOT DETECTED NOT DETECTED Final   Staphylococcus species NOT DETECTED NOT DETECTED Final   Staphylococcus aureus NOT DETECTED NOT DETECTED Final   Streptococcus species NOT DETECTED NOT DETECTED Final   Streptococcus agalactiae NOT DETECTED NOT DETECTED Final   Streptococcus pneumoniae NOT DETECTED NOT DETECTED Final   Streptococcus pyogenes NOT DETECTED NOT DETECTED Final   Acinetobacter baumannii NOT DETECTED NOT DETECTED Final   Enterobacteriaceae species DETECTED (A) NOT DETECTED Final    Comment: CRITICAL RESULT CALLED TO, READ BACK BY AND VERIFIED WITH: April [name] ON 10/21/15 AT 0005 [name]    Enterobacter cloacae complex NOT DETECTED NOT DETECTED Final   Escherichia coli NOT DETECTED NOT DETECTED Final   Klebsiella oxytoca NOT DETECTED NOT DETECTED Final   Klebsiella pneumoniae NOT DETECTED NOT DETECTED Final   Proteus species (A) NOT DETECTED Final    CRITICAL RESULT CALLED TO, READ BACK BY AND VERIFIED WITH:    Comment: April [name] ON 10/21/15 AT 0005 [name]   Serratia marcescens NOT DETECTED NOT DETECTED Final   Haemophilus influenzae NOT DETECTED NOT DETECTED Final   Neisseria  meningitidis NOT DETECTED NOT DETECTED Final  Pseudomonas aeruginosa NOT DETECTED NOT DETECTED Final   Candida albicans NOT DETECTED NOT DETECTED Final   Candida glabrata NOT DETECTED NOT DETECTED Final   Candida krusei NOT DETECTED NOT DETECTED Final   Candida parapsilosis NOT DETECTED NOT DETECTED Final   Candida tropicalis NOT DETECTED NOT DETECTED Final   Carbapenem resistance NOT DETECTED NOT DETECTED Final   Methicillin resistance NOT DETECTED NOT DETECTED Final   Vancomycin resistance NOT DETECTED NOT DETECTED Final  MRSA PCR Screening     Status: None   Collection Time: 10/20/15  7:41 PM  Result Value Ref Range Status   MRSA by PCR NEGATIVE NEGATIVE Final    Comment:        The GeneXpert MRSA Assay (FDA approved for NASAL specimens only), is one component of a comprehensive MRSA colonization surveillance program. It is not intended to diagnose MRSA infection nor to guide or monitor treatment for MRSA infections.   CULTURE, BLOOD (ROUTINE X 2) w Reflex to PCR ID Panel     Status: None   Collection Time: 10/22/15 11:39 AM  Result Value Ref Range Status   Specimen Description BLOOD LEFT HAND  Final   Special Requests BOTTLES DRAWN AEROBIC AND ANAEROBIC 1CC  Final   Culture NO GROWTH 5 DAYS  Final   Report Status 10/27/2015 FINAL  Final  Culture, expectorated sputum-assessment     Status: None   Collection Time: 10/22/15 12:00 PM  Result Value Ref Range Status   Specimen Description SPUTUM  Final   Special Requests Normal  Final   Sputum evaluation THIS SPECIMEN IS ACCEPTABLE FOR SPUTUM CULTURE  Final   Report Status 10/22/2015 FINAL  Final  Culture, respiratory (NON-Expectorated)     Status: None   Collection Time: 10/22/15 12:00 PM  Result Value Ref Range Status   Specimen Description SPUTUM  Final   Special Requests Normal Reflexed from [id number]  Final   Gram Stain   Final    FAIR SPECIMEN - 70-80% WBCS FEW WBC SEEN RARE YEAST RARE GRAM NEGATIVE RODS    Culture  LIGHT GROWTH ENTEROBACTER AEROGENES  Final   Report Status 10/24/2015 FINAL  Final   Organism ID, Bacteria ENTEROBACTER AEROGENES  Final      Susceptibility   Enterobacter aerogenes - MIC*    CEFAZOLIN >=64 RESISTANT Resistant     CEFEPIME <=1 SENSITIVE Sensitive     CEFTAZIDIME <=1 SENSITIVE Sensitive     CEFTRIAXONE <=1 SENSITIVE Sensitive     CIPROFLOXACIN <=0.25 SENSITIVE Sensitive     GENTAMICIN <=1 SENSITIVE Sensitive     IMIPENEM 2 SENSITIVE Sensitive     TRIMETH/SULFA <=20 SENSITIVE Sensitive     PIP/TAZO <=4 SENSITIVE Sensitive     * LIGHT GROWTH ENTEROBACTER AEROGENES  Urine culture     Status: None   Collection Time: 10/22/15 12:45 PM  Result Value Ref Range Status   Specimen Description URINE, RANDOM  Final   Special Requests NONE  Final   Culture NO GROWTH 2 DAYS  Final   Report Status 10/24/2015 FINAL  Final  Urine culture     Status: None   Collection Time: 10/28/15  2:36 PM  Result Value Ref Range Status   Specimen Description URINE, RANDOM  Final   Special Requests NONE  Final   Culture NO GROWTH 2 DAYS  Final   Report Status 10/30/2015 FINAL  Final    Medications:  Scheduled:  . antiseptic oral rinse  7 mL Mouth Rinse q12n4p  . ceFEPime (  MAXIPIME) IV  2 g Intravenous 3 times per day  . chlorhexidine  15 mL Mouth Rinse BID  . enoxaparin (LOVENOX) injection  40 mg Subcutaneous Q12H  . insulin aspart  0-5 Units Subcutaneous QHS  . insulin aspart  0-9 Units Subcutaneous TID WC  . insulin glargine  15 Units Subcutaneous Daily  . ipratropium-albuterol  3 mL Nebulization Q6H  . potassium chloride  40 mEq Oral Once   Infusions:     Assessment: 70 y/o F with septic shock from Proteus UTI. Patient with ARF recently transitioned from CRRT to HD not currently requiring HD.    Plan:  1. Will continue cefepime dosing  2g IV q8 hours. ID plans for transition to oral therapy with Cipro.   2. Electrolytes: Potassium low at 2.9 this AM and replaced with 4 runs of  KCl 10 meq and 40 meq KCl po. Will recheck potasium at 1800.   2/8 PM K+ 3.4. KCl 10 mEq IV x 2 doses ordered. BMP scheduled in AM.  3. Constipation: Patient with last BM 2/7. No medications scheduled at this time. Pharmacy will continue to monitor and adjust per consult.   Pharmacy will continue to monitor and adjust per consult.    April [name], PharmD Clinical Pharmacist   11/04/2015

## 2015-11-04 NOTE — Care Management (Signed)
Patient is more alert today and her friend Evlyn Clines is at bed side. Juliann Pulse works with with patient.  Primary nurse and attending says even though patient getting more clear cognitively,  patient does still has periods of confusion. Juliann Pulse says that patient's family wish for all communication to go through her so she can relay medical information to family. Informed Juliann Pulse that  at present- all care updates should be given to [facility]- patient's cousin) unless patient remains mentally clear to give permission for updates to be given to Evlyn Clines [phone])  Juliann Pulse says that care team should not be talking with Deneen Harts because he is 70 years old and has dementia.  [place] (cousin- son of Kennith Center) and he states that his dad does not always understand what is being said because he can not hear well.  Updated Rock on plan of care: anticipate transfer out of icu and anticipate that patient will require skilled nursing placement post discharge.  Informed Rock that at present, patient does not meet criteria for ltac.  Patient is on nasal cannula and respiratory status is stabilizing, had the urinary stent 2/7 and white count is down.  He asks "what is being done about her gangrene?"  Discussed that patient will likely require amputation.  Physical therapy consult is now  pending.  Discussed the bed search process with Rock and the need for approval by [facility]

## 2015-11-04 NOTE — Progress Notes (Signed)
Nutrition Follow-up   INTERVENTION:   Medical Food Supplement Therapy: recommend addition of Mighty Shakes, Magic Cup TID on meal trays   NUTRITION DIAGNOSIS:   Inadequate oral intake related to acute illness as evidenced by NPO status. Being addressed as diet advanced, supplements   GOAL:   Provide needs based on ASPEN/SCCM guidelines   MONITOR:    (Energy Intake, Pulmonary, Digestive System, Electrolyte/Renal Profile, Glucose Profile)  REASON FOR ASSESSMENT:   Consult Enteral/tube feeding initiation and management  ASSESSMENT:     Diet Order:  DIET - DYS 1 Room service appropriate?: Yes with Assist; Fluid consistency:: Thin   Energy Intake: diet advanced today s/p SLP exam, no recorded po intake yet  Skin:  Reviewed, no issues  Electrolyte and Renal Profile:  Recent Labs Lab 11/01/15 0455  11/02/15 0509 11/03/15 0539 11/03/15 1245 11/03/15 1715 11/04/15 0533  BUN 39*  < > 30* 28*  --   --  23*  CREATININE 1.09*  < > 0.98 1.06*  --   --  0.98  NA 156*  < > 150* 146*  --   --  141  K 3.3*  < > 3.5 2.8* 3.3* 3.4* 2.9*  MG 2.0  --   --  1.8  --   --  2.0  PHOS 3.2  --   --  3.6  --   --  2.8  < > = values in this interval not displayed. Glucose Profile:  Recent Labs  11/03/15 1552 11/03/15 2124 11/04/15 0800  GLUCAP 171* 155* 155*   Meds: ss novolog, lantus, potassium chloride, magnesium sulfate   Height:   Ht Readings from Last 1 Encounters:  11/02/15 '5\' 7"'$  (1.702 m)    Weight:   Wt Readings from Last 1 Encounters:  11/04/15 286 lb 9.6 oz (130 kg)   Filed Weights   11/02/15 0400 11/03/15 0500 11/04/15 0500  Weight: 266 lb 5.1 oz (120.8 kg) 264 lb 1.8 oz (119.8 kg) 286 lb 9.6 oz (130 kg)    BMI:  Body mass index is 44.88 kg/(m^2).  Estimated Nutritional Needs:   Kcal:  BEE 1167 kcals (IF 1.1-1.2, AF 1.2) 1540-1680 kcals/d. (Using IBW of 61kg)  Protein:  (1.0-1.2 g/kg) 61-73 g/d  Fluid:  1525-1830 (25-43m/kg)   EDUCATION NEEDS:    No education needs identified at this time  HMonte Grande RD, LDN [phone]  ([phone]/On-Call Pager

## 2015-11-04 NOTE — Progress Notes (Signed)
Pharmacy Antibiotic Note  April [name] is a 70 y.o. female admitted on 10/20/2015 with enterobacter bacteremia.  Pharmacy has been consulted for ceftriaxone dosing.  Plan: This is currently day #16 of antibiotics. Patient has been on cefepime 2 g IV q 8 hours. ID is following and patient is being switched to ceftriaxone today based on sensitivities.   Ceftriaxone 2 g IV daily  Height: '5\' 7"'$  (170.2 cm) Weight: 286 lb 9.6 oz (130 kg) IBW/kg (Calculated) : 61.6  Temp (24hrs), Avg:98.9 F (37.2 C), Min:98.2 F (36.8 C), Max:99.5 F (37.5 C)   Recent Labs Lab 10/29/15 0421  10/30/15 0431 10/31/15 0500  11/01/15 0455 11/01/15 1300 11/02/15 0509 11/03/15 0539 11/04/15 0533  WBC 31.0*  --  29.4* 28.4*  --   --   --  33.5*  --  18.4*  CREATININE 1.40*  < > 1.33* 1.25*  < > 1.09* 1.12* 0.98 1.06* 0.98  < > = values in this interval not displayed.  Estimated Creatinine Clearance: 76.1 mL/min (by C-G formula based on Cr of 0.98).    Allergies  Allergen Reactions  . Prednisone Anaphylaxis   Antimicrobials this admission: ceftriaxone 2/8 >>  cefepime 2/1 >> 2/8 Ceftriaxone 1/30 >> 1/31 Meropenem 1/27 >> 1/29  Microbiology results: 1/26 BCx: enterobacter aerogenes 1/24 BCx: proteus 2/1 UCx: no growth 2 days  1/24 UCx: proteus 1/24  MRSA PCR: negative  Thank you for allowing pharmacy to be a part of this patient's care.  Lenis Noon, PharmD Clinical Pharmacist 11/04/2015 6:24 PM

## 2015-11-04 NOTE — Plan of Care (Signed)
Problem: ICU Phase Progression Outcomes Goal: Initial discharge plan identified Outcome: Completed/Met Date Met:  11/04/15 CM is talking with family

## 2015-11-04 NOTE — Progress Notes (Signed)
Chaplain rounded in the unit and provided a compassionate presence and support to the patient. Said silent prayer. Minerva Fester [phone]

## 2015-11-04 NOTE — Progress Notes (Addendum)
[place] INFECTIOUS DISEASE PROGRESS NOTE Date of Admission:  10/20/2015     ID: April [name] is a 70 y.o. female with urosepsis  Active Problems:   Sepsis ([place])   Acute renal failure (HCC)   Altered mental status   NSTEMI (non-ST elevated myocardial infarction) ([place])   Sepsis due to urinary tract infection ([place])   Acute renal failure (ARF) ([place])   Hydronephrosis   Endotracheally intubated   Respiratory distress   Arterial hypotension   Respiratory failure ([place])   Proteus septicemia (HCC)   Subjective: S/p uretheral stent. No new complaints  ROS  Eleven systems are reviewed and negative except per hpi  Medications:  Antibiotics Given (last 72 hours)    Date/Time Action Medication Dose Rate   11/01/15 2240 Given   ceFEPIme (MAXIPIME) 2 g in dextrose 5 % 50 mL IVPB 2 g 100 mL/hr   11/02/15 1132 Given   ceFEPIme (MAXIPIME) 2 g in dextrose 5 % 50 mL IVPB 2 g 100 mL/hr   11/02/15 2338 Given   ceFEPIme (MAXIPIME) 2 g in dextrose 5 % 50 mL IVPB 2 g 100 mL/hr   11/03/15 0552 Given   ceFEPIme (MAXIPIME) 2 g in dextrose 5 % 50 mL IVPB 2 g 100 mL/hr   11/03/15 2222 Given   ceFEPIme (MAXIPIME) 2 g in dextrose 5 % 50 mL IVPB 2 g 100 mL/hr   11/04/15 7408 Given   ceFEPIme (MAXIPIME) 2 g in dextrose 5 % 50 mL IVPB 2 g 100 mL/hr   11/04/15 1400 Given   ceFEPIme (MAXIPIME) 2 g in dextrose 5 % 50 mL IVPB 2 g 100 mL/hr     . antiseptic oral rinse  7 mL Mouth Rinse BID  . ceFEPime (MAXIPIME) IV  2 g Intravenous 3 times per day  . enoxaparin (LOVENOX) injection  40 mg Subcutaneous Q12H  . insulin aspart  0-5 Units Subcutaneous QHS  . insulin aspart  0-9 Units Subcutaneous TID WC  . insulin glargine  15 Units Subcutaneous Daily  . ipratropium-albuterol  3 mL Nebulization Q6H  . potassium chloride  40 mEq Oral Once    Objective: Vital signs in last 24 hours: Temp:  [98.1 F (36.7 C)-99.5 F (37.5 C)] 99 F (37.2 C) (02/08 1500) Pulse Rate:  [72-89] 78 (02/08 1500) Resp:   [18-27] 25 (02/08 1300) BP: (69-140)/(43-65) 139/52 mmHg (02/08 1300) SpO2:  [90 %-100 %] 90 % (02/08 1500) Weight:  [130 kg (286 lb 9.6 oz)] 130 kg (286 lb 9.6 oz) (02/08 0500) Constitutional: oriented to person, place, and time. Obese, slowed mentationHENT: [place]/AT, PERRLA, no scleral icterus Mouth/Throat: Oropharynx is clear and dry . No oropharyngeal exudate.  Cardiovascular: Normal rate, regular rhythm and normal heart sounds.  No murmur heard.  Pulmonary/Chest:bil rhonhci  Neck = supple, no nuchal rigidity Abdominal: Soft. Bowel sounds are normal. exhibits no distension. There is no tenderness.  Lymphadenopathy: no cervical adenopathy. No axillary adenopathy Neurological: alert and oriented to person, place, and time.  Skin: gangrene bil toes  Psychiatric: a normal mood and affect. behavior is normal.   Lab Results  Recent Labs  11/02/15 0509 11/03/15 0539  11/03/15 1715 11/04/15 0533  WBC 33.5*  --   --   --  18.4*  HGB 9.3*  --   --   --  8.5*  HCT 29.1*  --   --   --  25.7*  NA 150* 146*  --   --  141  K 3.5  2.8*  < > 3.4* 2.9*  CL 119* 114*  --   --  111  CO2 25 29  --   --  24  BUN 30* 28*  --   --  23*  CREATININE 0.98 1.06*  --   --  0.98  < > = values in this interval not displayed.  Microbiology: Results for orders placed or performed during the hospital encounter of 10/20/15  Urine culture     Status: None   Collection Time: 10/20/15  9:57 AM  Result Value Ref Range Status   Specimen Description URINE, RANDOM  Final   Special Requests NONE  Final   Culture >=100,000 COLONIES/mL PROTEUS MIRABILIS  Final   Report Status 10/22/2015 FINAL  Final   Organism ID, Bacteria PROTEUS MIRABILIS  Final      Susceptibility   Proteus mirabilis - MIC*    AMPICILLIN <=2 SENSITIVE Sensitive     GENTAMICIN <=1 SENSITIVE Sensitive     CEFTRIAXONE Value in next row Sensitive      SENSITIVE<=1    CIPROFLOXACIN Value in next row Sensitive      SENSITIVE<=0.25     IMIPENEM Value in next row Sensitive      SENSITIVE2    NITROFURANTOIN Value in next row Resistant      RESISTANT128    TRIMETH/SULFA Value in next row Sensitive      SENSITIVE<=20    PIP/TAZO Value in next row Sensitive      SENSITIVE<=4    AMPICILLIN/SULBACTAM Value in next row Sensitive      SENSITIVE<=2    * >=100,000 COLONIES/mL PROTEUS MIRABILIS  Culture, blood (routine x 2)     Status: None   Collection Time: 10/20/15  9:57 AM  Result Value Ref Range Status   Specimen Description BLOOD RIGHT WRIST  Final   Special Requests   Final    BOTTLES DRAWN AEROBIC AND ANAEROBIC ANA 1ML AER 4ML   Culture  Setup Time   Final    GRAM NEGATIVE RODS IN BOTH AEROBIC AND ANAEROBIC BOTTLES CRITICAL RESULT CALLED TO, READ BACK BY AND VERIFIED WITH: MATT MCBANE ON 10/21/15 AT 0005 [facility] CONFIRMED BY [place]    Culture   Final    PROTEUS MIRABILIS IN BOTH AEROBIC AND ANAEROBIC BOTTLES    Report Status 10/22/2015 FINAL  Final   Organism ID, Bacteria PROTEUS MIRABILIS  Final      Susceptibility   Proteus mirabilis - MIC*    AMPICILLIN/SULBACTAM Value in next row Sensitive      SENSITIVE<=2    PIP/TAZO Value in next row Sensitive      SENSITIVE<=4    CEFTRIAXONE Value in next row Sensitive      SENSITIVE<=1    IMIPENEM Value in next row Sensitive      SENSITIVE2    GENTAMICIN Value in next row Sensitive      SENSITIVE<=1    CIPROFLOXACIN Value in next row Sensitive      SENSITIVE<=0.25    * PROTEUS MIRABILIS  Culture, blood (routine x 2)     Status: None   Collection Time: 10/20/15  9:57 AM  Result Value Ref Range Status   Specimen Description BLOOD LEFT HAND  Final   Special Requests   Final    BOTTLES DRAWN AEROBIC AND ANAEROBIC AER 3ML ANA 2ML   Culture  Setup Time   Final    GRAM NEGATIVE RODS IN BOTH AEROBIC AND ANAEROBIC BOTTLES CRITICAL VALUE NOTED.  VALUE IS  CONSISTENT WITH PREVIOUSLY REPORTED AND CALLED VALUE.    Culture   Final    PROTEUS MIRABILIS IN BOTH AEROBIC AND  ANAEROBIC BOTTLES    Report Status 10/22/2015 FINAL  Final   Organism ID, Bacteria PROTEUS MIRABILIS  Final      Susceptibility   Proteus mirabilis - MIC*    AMPICILLIN Value in next row Sensitive      SENSITIVE<=2    PIP/TAZO Value in next row Sensitive      SENSITIVE<=4    CEFTAZIDIME Value in next row Sensitive      SENSITIVE<=1    CEFTRIAXONE Value in next row Sensitive      SENSITIVE<=1    CEFEPIME Value in next row Sensitive      SENSITIVE<=1    IMIPENEM Value in next row Sensitive      SENSITIVE2    GENTAMICIN Value in next row Sensitive      SENSITIVE<=1    CIPROFLOXACIN Value in next row Sensitive      SENSITIVE<=0.25    * PROTEUS MIRABILIS  Blood Culture ID Panel (Reflexed)     Status: Abnormal   Collection Time: 10/20/15  9:57 AM  Result Value Ref Range Status   Enterococcus species NOT DETECTED NOT DETECTED Final   Listeria monocytogenes NOT DETECTED NOT DETECTED Final   Staphylococcus species NOT DETECTED NOT DETECTED Final   Staphylococcus aureus NOT DETECTED NOT DETECTED Final   Streptococcus species NOT DETECTED NOT DETECTED Final   Streptococcus agalactiae NOT DETECTED NOT DETECTED Final   Streptococcus pneumoniae NOT DETECTED NOT DETECTED Final   Streptococcus pyogenes NOT DETECTED NOT DETECTED Final   Acinetobacter baumannii NOT DETECTED NOT DETECTED Final   Enterobacteriaceae species DETECTED (A) NOT DETECTED Final    Comment: CRITICAL RESULT CALLED TO, READ BACK BY AND VERIFIED WITH: MATT MCBANE ON 10/21/15 AT 0005 [place]    Enterobacter cloacae complex NOT DETECTED NOT DETECTED Final   Escherichia coli NOT DETECTED NOT DETECTED Final   Klebsiella oxytoca NOT DETECTED NOT DETECTED Final   Klebsiella pneumoniae NOT DETECTED NOT DETECTED Final   Proteus species (A) NOT DETECTED Final    CRITICAL RESULT CALLED TO, READ BACK BY AND VERIFIED WITH:    Comment: MATT MCBANE ON 10/21/15 AT 0005 [place]   Serratia marcescens NOT DETECTED NOT DETECTED Final    Haemophilus influenzae NOT DETECTED NOT DETECTED Final   Neisseria meningitidis NOT DETECTED NOT DETECTED Final   Pseudomonas aeruginosa NOT DETECTED NOT DETECTED Final   Candida albicans NOT DETECTED NOT DETECTED Final   Candida glabrata NOT DETECTED NOT DETECTED Final   Candida krusei NOT DETECTED NOT DETECTED Final   Candida parapsilosis NOT DETECTED NOT DETECTED Final   Candida tropicalis NOT DETECTED NOT DETECTED Final   Carbapenem resistance NOT DETECTED NOT DETECTED Final   Methicillin resistance NOT DETECTED NOT DETECTED Final   Vancomycin resistance NOT DETECTED NOT DETECTED Final  MRSA PCR Screening     Status: None   Collection Time: 10/20/15  7:41 PM  Result Value Ref Range Status   MRSA by PCR NEGATIVE NEGATIVE Final    Comment:        The GeneXpert MRSA Assay (FDA approved for NASAL specimens only), is one component of a comprehensive MRSA colonization surveillance program. It is not intended to diagnose MRSA infection nor to guide or monitor treatment for MRSA infections.   CULTURE, BLOOD (ROUTINE X 2) w Reflex to PCR ID Panel     Status: None   Collection  Time: 10/22/15 11:39 AM  Result Value Ref Range Status   Specimen Description BLOOD LEFT HAND  Final   Special Requests BOTTLES DRAWN AEROBIC AND ANAEROBIC 1CC  Final   Culture NO GROWTH 5 DAYS  Final   Report Status 10/27/2015 FINAL  Final  Culture, expectorated sputum-assessment     Status: None   Collection Time: 10/22/15 12:00 PM  Result Value Ref Range Status   Specimen Description SPUTUM  Final   Special Requests Normal  Final   Sputum evaluation THIS SPECIMEN IS ACCEPTABLE FOR SPUTUM CULTURE  Final   Report Status 10/22/2015 FINAL  Final  Culture, respiratory (NON-Expectorated)     Status: None   Collection Time: 10/22/15 12:00 PM  Result Value Ref Range Status   Specimen Description SPUTUM  Final   Special Requests Normal Reflexed from [id number]  Final   Gram Stain   Final    FAIR SPECIMEN - 70-80%  WBCS FEW WBC SEEN RARE YEAST RARE GRAM NEGATIVE RODS    Culture LIGHT GROWTH ENTEROBACTER AEROGENES  Final   Report Status 10/24/2015 FINAL  Final   Organism ID, Bacteria ENTEROBACTER AEROGENES  Final      Susceptibility   Enterobacter aerogenes - MIC*    CEFAZOLIN >=64 RESISTANT Resistant     CEFEPIME <=1 SENSITIVE Sensitive     CEFTAZIDIME <=1 SENSITIVE Sensitive     CEFTRIAXONE <=1 SENSITIVE Sensitive     CIPROFLOXACIN <=0.25 SENSITIVE Sensitive     GENTAMICIN <=1 SENSITIVE Sensitive     IMIPENEM 2 SENSITIVE Sensitive     TRIMETH/SULFA <=20 SENSITIVE Sensitive     PIP/TAZO <=4 SENSITIVE Sensitive     * LIGHT GROWTH ENTEROBACTER AEROGENES  Urine culture     Status: None   Collection Time: 10/22/15 12:45 PM  Result Value Ref Range Status   Specimen Description URINE, RANDOM  Final   Special Requests NONE  Final   Culture NO GROWTH 2 DAYS  Final   Report Status 10/24/2015 FINAL  Final  Urine culture     Status: None   Collection Time: 10/28/15  2:36 PM  Result Value Ref Range Status   Specimen Description URINE, RANDOM  Final   Special Requests NONE  Final   Culture NO GROWTH 2 DAYS  Final   Report Status 10/30/2015 FINAL  Final     Studies/Results: Dg Chest Port 1 View  11/04/2015  CLINICAL DATA:  PICC line insertion. EXAM: PORTABLE CHEST 1 VIEW COMPARISON:  Chest x-ray dated 11/01/2015. FINDINGS: Left-sided central line in place with tip stable in position at the upper margin of the SVC. New right-sided PICC line now in place with tip well-positioned in the lower SVC. Cardiomediastinal silhouette is stable in size and configuration. Central pulmonary vascular congestion and bilateral interstitial edema persists, but decreased compared to the previous exam. Opacities at each lung base most likely representing a combination of small pleural effusions and atelectasis. IMPRESSION: 1. Right-sided PICC line placement with tip well-positioned in the lower SVC, near the expected level  of the cavoatrial junction. 2. Left-sided central line stable in position with tip at the upper margin of the SVC. 3. Persistent central pulmonary vascular congestion and bilateral interstitial edema, but improved compared to the previous exam suggesting improved fluid status. 4. Probable small bilateral pleural effusions and bibasilar atelectasis. Electronically Signed   By: Franki Cabot M.D.   On: 11/04/2015 14:37    Assessment/Plan: April [name] is a 70 y.o. female found down and admitted with  ARF,  urosepsis with proteus (blood and urine), acute resp failure with sputum cxs growing enterobacter.  Clinically improving but has residual bil LE gangrene, elevated WBC and noted to have R hydro on renal US. Repeat UA neg and ucx neg. CXR with no consolidation.  Currently day 16 abx. Last fever 101 on 2/3. WBC remains elevated but down from 33->18.  Had ureteral stent place 2/7.  Recommendations Can change to IV ceftraixone As she improves can transiition to oral therapy with bactrim WBC and low grade temps likely due to the gangrene ? 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 Will follow with you.  [place], [place]   11/04/2015, 4:36 PM

## 2015-11-04 NOTE — Progress Notes (Signed)
[facility]  ROUNDING NOTE   Subjective:  Had right ureteral stent placement yesterday. Na down to 141. K low at 2.9 this AM.  Cr currently 0.98  Objective:  Vital signs in last 24 hours:  Temp:  [98.1 F (36.7 C)-99 F (37.2 C)] 99 F (37.2 C) (02/08 0700) Pulse Rate:  [72-88] 87 (02/08 0700) Resp:  [18-26] 26 (02/08 0700) BP: (112-143)/(45-65) 134/65 mmHg (02/08 0600) SpO2:  [93 %-100 %] 93 % (02/08 0700) Weight:  [130 kg (286 lb 9.6 oz)] 130 kg (286 lb 9.6 oz) (02/08 0500)  Weight change: 10.2 kg (22 lb 7.8 oz) Filed Weights   11/02/15 0400 11/03/15 0500 11/04/15 0500  Weight: 120.8 kg (266 lb 5.1 oz) 119.8 kg (264 lb 1.8 oz) 130 kg (286 lb 9.6 oz)    Intake/Output: I/O last 3 completed shifts: In: 3424.6 [I.V.:2674.6; IV Piggyback:750] Out: 3795 [Urine:3395; Stool:400]   Intake/Output this shift:     Physical Exam: General: No acute distress  Head: [place]/AT hard of hearing OM dry  Eyes: Eyes open   Neck: supple  Lungs:  Scattered rhonchi, normal effort  Heart: Regular, no rub   Abdomen:  Soft, nontender, obese  Extremities: 3+ peripheral edema. +anasarca  Neurologic:  alert, able to follow simple commands   Skin: Feet in soft support, wounds noted b/l LE's       Basic Metabolic Panel:  Recent Labs Lab 10/30/15 0431  10/31/15 0649  11/01/15 0455 11/01/15 1300 11/02/15 0509 11/03/15 0539 11/03/15 1245 11/03/15 1715 11/04/15 0533  NA 157*  < >  --   < > 156* 151* 150* 146*  --   --  141  K 3.7  < >  --   < > 3.3* 3.4* 3.5 2.8* 3.3* 3.4* 2.9*  CL 121*  < >  --   < > 123* 121* 119* 114*  --   --  111  CO2 31  < >  --   < > '30 27 25 29  '$ --   --  24  GLUCOSE 171*  < >  --   < > 144* 185* 184* 255*  --   --  192*  BUN 54*  < >  --   < > 39* 34* 30* 28*  --   --  23*  CREATININE 1.33*  < >  --   < > 1.09* 1.12* 0.98 1.06*  --   --  0.98  CALCIUM 8.1*  < >  --   < > 7.6* 7.6* 7.7* 7.5*  --   --  7.4*  MG 2.1  --  2.0  --  2.0  --   --  1.8   --   --  2.0  PHOS 3.3  --   --   --  3.2  --   --  3.6  --   --  2.8  < > = values in this interval not displayed.  Liver Function Tests:  Recent Labs Lab 11/02/15 0509 11/03/15 0539  AST 20 20  ALT 29 26  ALKPHOS 80 87  BILITOT 1.0 0.7  PROT 5.8* 5.4*  ALBUMIN 1.7* 1.6*   No results for input(s): LIPASE, AMYLASE in the last 168 hours.  Recent Labs Lab 10/31/15 0649  AMMONIA 18    CBC:  Recent Labs Lab 10/29/15 0421 10/30/15 0431 10/31/15 0500 11/02/15 0509 11/04/15 0533  WBC 31.0* 29.4* 28.4* 33.5* 18.4*  HGB 9.4* 10.0* 9.9* 9.3* 8.5*  HCT  29.0* 30.8* 30.6* 29.1* 25.7*  MCV 92.1 94.1 93.7 94.1 91.1  PLT 132* 181 211 203 187    Cardiac Enzymes: No results for input(s): CKTOTAL, CKMB, CKMBINDEX, TROPONINI in the last 168 hours.  BNP: Invalid input(s): POCBNP  CBG:  Recent Labs Lab 11/03/15 0719 11/03/15 1119 11/03/15 1552 11/03/15 2124 11/04/15 0800  GLUCAP 230* 218* 171* 155* 155*    Microbiology: Results for orders placed or performed during the hospital encounter of 10/20/15  Urine culture     Status: None   Collection Time: 10/20/15  9:57 AM  Result Value Ref Range Status   Specimen Description URINE, RANDOM  Final   Special Requests NONE  Final   Culture >=100,000 COLONIES/mL PROTEUS MIRABILIS  Final   Report Status 10/22/2015 FINAL  Final   Organism ID, Bacteria PROTEUS MIRABILIS  Final      Susceptibility   Proteus mirabilis - MIC*    AMPICILLIN <=2 SENSITIVE Sensitive     GENTAMICIN <=1 SENSITIVE Sensitive     CEFTRIAXONE Value in next row Sensitive      SENSITIVE<=1    CIPROFLOXACIN Value in next row Sensitive      SENSITIVE<=0.25    IMIPENEM Value in next row Sensitive      SENSITIVE2    NITROFURANTOIN Value in next row Resistant      RESISTANT128    TRIMETH/SULFA Value in next row Sensitive      SENSITIVE<=20    PIP/TAZO Value in next row Sensitive      SENSITIVE<=4    AMPICILLIN/SULBACTAM Value in next row Sensitive       SENSITIVE<=2    * >=100,000 COLONIES/mL PROTEUS MIRABILIS  Culture, blood (routine x 2)     Status: None   Collection Time: 10/20/15  9:57 AM  Result Value Ref Range Status   Specimen Description BLOOD RIGHT WRIST  Final   Special Requests   Final    BOTTLES DRAWN AEROBIC AND ANAEROBIC ANA 1ML AER 4ML   Culture  Setup Time   Final    GRAM NEGATIVE RODS IN BOTH AEROBIC AND ANAEROBIC BOTTLES CRITICAL RESULT CALLED TO, READ BACK BY AND VERIFIED WITH: MATT MCBANE ON 10/21/15 AT 0005 [facility] CONFIRMED BY [place]    Culture   Final    PROTEUS MIRABILIS IN BOTH AEROBIC AND ANAEROBIC BOTTLES    Report Status 10/22/2015 FINAL  Final   Organism ID, Bacteria PROTEUS MIRABILIS  Final      Susceptibility   Proteus mirabilis - MIC*    AMPICILLIN/SULBACTAM Value in next row Sensitive      SENSITIVE<=2    PIP/TAZO Value in next row Sensitive      SENSITIVE<=4    CEFTRIAXONE Value in next row Sensitive      SENSITIVE<=1    IMIPENEM Value in next row Sensitive      SENSITIVE2    GENTAMICIN Value in next row Sensitive      SENSITIVE<=1    CIPROFLOXACIN Value in next row Sensitive      SENSITIVE<=0.25    * PROTEUS MIRABILIS  Culture, blood (routine x 2)     Status: None   Collection Time: 10/20/15  9:57 AM  Result Value Ref Range Status   Specimen Description BLOOD LEFT HAND  Final   Special Requests   Final    BOTTLES DRAWN AEROBIC AND ANAEROBIC AER 3ML ANA 2ML   Culture  Setup Time   Final    GRAM NEGATIVE RODS IN BOTH AEROBIC AND ANAEROBIC  BOTTLES CRITICAL VALUE NOTED.  VALUE IS CONSISTENT WITH PREVIOUSLY REPORTED AND CALLED VALUE.    Culture   Final    PROTEUS MIRABILIS IN BOTH AEROBIC AND ANAEROBIC BOTTLES    Report Status 10/22/2015 FINAL  Final   Organism ID, Bacteria PROTEUS MIRABILIS  Final      Susceptibility   Proteus mirabilis - MIC*    AMPICILLIN Value in next row Sensitive      SENSITIVE<=2    PIP/TAZO Value in next row Sensitive      SENSITIVE<=4    CEFTAZIDIME Value in  next row Sensitive      SENSITIVE<=1    CEFTRIAXONE Value in next row Sensitive      SENSITIVE<=1    CEFEPIME Value in next row Sensitive      SENSITIVE<=1    IMIPENEM Value in next row Sensitive      SENSITIVE2    GENTAMICIN Value in next row Sensitive      SENSITIVE<=1    CIPROFLOXACIN Value in next row Sensitive      SENSITIVE<=0.25    * PROTEUS MIRABILIS  Blood Culture ID Panel (Reflexed)     Status: Abnormal   Collection Time: 10/20/15  9:57 AM  Result Value Ref Range Status   Enterococcus species NOT DETECTED NOT DETECTED Final   Listeria monocytogenes NOT DETECTED NOT DETECTED Final   Staphylococcus species NOT DETECTED NOT DETECTED Final   Staphylococcus aureus NOT DETECTED NOT DETECTED Final   Streptococcus species NOT DETECTED NOT DETECTED Final   Streptococcus agalactiae NOT DETECTED NOT DETECTED Final   Streptococcus pneumoniae NOT DETECTED NOT DETECTED Final   Streptococcus pyogenes NOT DETECTED NOT DETECTED Final   Acinetobacter baumannii NOT DETECTED NOT DETECTED Final   Enterobacteriaceae species DETECTED (A) NOT DETECTED Final    Comment: CRITICAL RESULT CALLED TO, READ BACK BY AND VERIFIED WITH: MATT MCBANE ON 10/21/15 AT 0005 [place]    Enterobacter cloacae complex NOT DETECTED NOT DETECTED Final   Escherichia coli NOT DETECTED NOT DETECTED Final   Klebsiella oxytoca NOT DETECTED NOT DETECTED Final   Klebsiella pneumoniae NOT DETECTED NOT DETECTED Final   Proteus species (A) NOT DETECTED Final    CRITICAL RESULT CALLED TO, READ BACK BY AND VERIFIED WITH:    Comment: MATT MCBANE ON 10/21/15 AT 0005 [place]   Serratia marcescens NOT DETECTED NOT DETECTED Final   Haemophilus influenzae NOT DETECTED NOT DETECTED Final   Neisseria meningitidis NOT DETECTED NOT DETECTED Final   Pseudomonas aeruginosa NOT DETECTED NOT DETECTED Final   Candida albicans NOT DETECTED NOT DETECTED Final   Candida glabrata NOT DETECTED NOT DETECTED Final   Candida krusei NOT DETECTED NOT  DETECTED Final   Candida parapsilosis NOT DETECTED NOT DETECTED Final   Candida tropicalis NOT DETECTED NOT DETECTED Final   Carbapenem resistance NOT DETECTED NOT DETECTED Final   Methicillin resistance NOT DETECTED NOT DETECTED Final   Vancomycin resistance NOT DETECTED NOT DETECTED Final  MRSA PCR Screening     Status: None   Collection Time: 10/20/15  7:41 PM  Result Value Ref Range Status   MRSA by PCR NEGATIVE NEGATIVE Final    Comment:        The GeneXpert MRSA Assay (FDA approved for NASAL specimens only), is one component of a comprehensive MRSA colonization surveillance program. It is not intended to diagnose MRSA infection nor to guide or monitor treatment for MRSA infections.   CULTURE, BLOOD (ROUTINE X 2) w Reflex to PCR ID Panel  Status: None   Collection Time: 10/22/15 11:39 AM  Result Value Ref Range Status   Specimen Description BLOOD LEFT HAND  Final   Special Requests BOTTLES DRAWN AEROBIC AND ANAEROBIC 1CC  Final   Culture NO GROWTH 5 DAYS  Final   Report Status 10/27/2015 FINAL  Final  Culture, expectorated sputum-assessment     Status: None   Collection Time: 10/22/15 12:00 PM  Result Value Ref Range Status   Specimen Description SPUTUM  Final   Special Requests Normal  Final   Sputum evaluation THIS SPECIMEN IS ACCEPTABLE FOR SPUTUM CULTURE  Final   Report Status 10/22/2015 FINAL  Final  Culture, respiratory (NON-Expectorated)     Status: None   Collection Time: 10/22/15 12:00 PM  Result Value Ref Range Status   Specimen Description SPUTUM  Final   Special Requests Normal Reflexed from [id number]  Final   Gram Stain   Final    FAIR SPECIMEN - 70-80% WBCS FEW WBC SEEN RARE YEAST RARE GRAM NEGATIVE RODS    Culture LIGHT GROWTH ENTEROBACTER AEROGENES  Final   Report Status 10/24/2015 FINAL  Final   Organism ID, Bacteria ENTEROBACTER AEROGENES  Final      Susceptibility   Enterobacter aerogenes - MIC*    CEFAZOLIN >=64 RESISTANT Resistant      CEFEPIME <=1 SENSITIVE Sensitive     CEFTAZIDIME <=1 SENSITIVE Sensitive     CEFTRIAXONE <=1 SENSITIVE Sensitive     CIPROFLOXACIN <=0.25 SENSITIVE Sensitive     GENTAMICIN <=1 SENSITIVE Sensitive     IMIPENEM 2 SENSITIVE Sensitive     TRIMETH/SULFA <=20 SENSITIVE Sensitive     PIP/TAZO <=4 SENSITIVE Sensitive     * LIGHT GROWTH ENTEROBACTER AEROGENES  Urine culture     Status: None   Collection Time: 10/22/15 12:45 PM  Result Value Ref Range Status   Specimen Description URINE, RANDOM  Final   Special Requests NONE  Final   Culture NO GROWTH 2 DAYS  Final   Report Status 10/24/2015 FINAL  Final  Urine culture     Status: None   Collection Time: 10/28/15  2:36 PM  Result Value Ref Range Status   Specimen Description URINE, RANDOM  Final   Special Requests NONE  Final   Culture NO GROWTH 2 DAYS  Final   Report Status 10/30/2015 FINAL  Final    Coagulation Studies: No results for input(s): LABPROT, INR in the last 72 hours.  Urinalysis: No results for input(s): COLORURINE, LABSPEC, PHURINE, GLUCOSEU, HGBUR, BILIRUBINUR, KETONESUR, PROTEINUR, UROBILINOGEN, NITRITE, LEUKOCYTESUR in the last 72 hours.  Invalid input(s): APPERANCEUR    Imaging: US Renal  11/02/2015  CLINICAL DATA:  Followup for hydronephrosis. EXAM: RENAL / URINARY TRACT ULTRASOUND COMPLETE COMPARISON:  CT, 10/27/2015.  Renal ultrasound, 10/21/2015. FINDINGS: Right Kidney: Length: 12.5 cm. Normal parenchymal echogenicity. Mild hydronephrosis similar to the prior ultrasound and CT. No mass or stone. Left Kidney: Length: 12.1 cm. Echogenicity within normal limits. No mass or hydronephrosis visualized. Bladder: Foley catheter in place. Bladder still mildly distended. No bladder mass or stone. IMPRESSION: 1. Mild right hydronephrosis similar to the prior exams. 2. No left hydronephrosis.  No renal masses or stones. Electronically Signed   By: Lajean Manes M.D.   On: 11/02/2015 15:35     Medications:   . dextrose 75  mL/hr at 11/04/15 0255   . antiseptic oral rinse  7 mL Mouth Rinse q12n4p  . ceFEPime (MAXIPIME) IV  2 g Intravenous 3 times per day  .  chlorhexidine  15 mL Mouth Rinse BID  . heparin subcutaneous  5,000 Units Subcutaneous Q12H  . insulin aspart  0-5 Units Subcutaneous QHS  . insulin aspart  0-9 Units Subcutaneous TID WC  . insulin glargine  15 Units Subcutaneous Daily  . ipratropium-albuterol  3 mL Nebulization Q6H  . potassium chloride  10 mEq Intravenous Q1H   [DISCONTINUED] acetaminophen **OR** acetaminophen, bisacodyl, iohexol, morphine injection, ondansetron (ZOFRAN) IV, prochlorperazine, sodium chloride flush  Assessment/ Plan:  [name] is a 70 y.o. white female with DM (A1c 7.7%),  who was admitted to [facility] on 10/20/2015  1. Acute renal failure with metabolic acidosis:  - Unknown baseline creatinine. Admit Cr 3.01 Required CRRT from 1/24 to 1/27. Then intermittent hemodialysis 1/28. Acute renal failure from sepsis leading to ATN. Serologic testing negative 10/21/15 - Cr now down to 0.98, BUN still slightly high, continue to monitor.  2. Hypernatremia - from post ATN diuresis - Na down to 141, on D5W, if able to take adequate PO may stop, but continue until this is determined.  3. Hypokalemia: post ATN diuresis.  - K quite low at 2.9, being given aggressive repeletion today.  4. Sepsis/hypotension secondary to urinary tract infection: proteus - continue cefepime.  5. Anasarca - still with considerable anasarca, hopefully will improve as her mobility and nutrition imrpve.   6. 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  S/p right ureteral stent placement 11/03/15 by Dr. Erlene Quan.  7. Acute resp failure - Extubated February 2, doing well off vent.   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    LOS: [place], April [name] 2/8/20178:07 AM

## 2015-11-04 NOTE — Plan of Care (Signed)
Problem: SLP Dysphagia Goals Goal: Misc Dysphagia Goal Pt will safely tolerate po diet of least restrictive consistency w/ no overt s/s of aspiration noted by Staff/pt/family x3 sessions.    

## 2015-11-04 NOTE — Progress Notes (Signed)
Urology Consult Follow Up  Subjective: Postop issues. Urine clear and Foley bag today. Good urine output.  Anti-infectives: Anti-infectives    Start     Dose/Rate Route Frequency Ordered Stop   11/02/15 2200  ceFEPIme (MAXIPIME) 2 g in dextrose 5 % 50 mL IVPB    Comments:  Pharmacy please adjust dosing as needed.   2 g 100 mL/hr over 30 Minutes Intravenous 3 times per day 11/02/15 1257     10/28/15 2200  ceFEPIme (MAXIPIME) 2 g in dextrose 5 % 50 mL IVPB  Status:  Discontinued    Comments:  Pharmacy please adjust dosing as needed.   2 g 100 mL/hr over 30 Minutes Intravenous Every 12 hours 10/28/15 1420 11/02/15 1257   10/28/15 1000  ceFEPIme (MAXIPIME) 1 g in dextrose 5 % 50 mL IVPB  Status:  Discontinued    Comments:  Pharmacy please adjust dosing as needed.   1 g 100 mL/hr over 30 Minutes Intravenous Every 12 hours 10/28/15 0845 10/28/15 1420   10/26/15 1800  cefTRIAXone (ROCEPHIN) 2 g in dextrose 5 % 50 mL IVPB  Status:  Discontinued     2 g 100 mL/hr over 30 Minutes Intravenous Every 24 hours 10/26/15 1118 10/28/15 1132   10/25/15 1800  meropenem (MERREM) 500 mg in sodium chloride 0.9 % 50 mL IVPB  Status:  Discontinued     500 mg 100 mL/hr over 30 Minutes Intravenous Every 24 hours 10/24/15 1025 10/26/15 1050   10/23/15 1230  meropenem (MERREM) 1 g in sodium chloride 0.9 % 100 mL IVPB  Status:  Discontinued     1 g 200 mL/hr over 30 Minutes Intravenous Every 12 hours 10/23/15 1140 10/24/15 1025   10/22/15 1800  cefTRIAXone (ROCEPHIN) 2 g in dextrose 5 % 50 mL IVPB  Status:  Discontinued     2 g 100 mL/hr over 30 Minutes Intravenous Every 24 hours 10/21/15 1115 10/23/15 1136   10/21/15 1600  cefTRIAXone (ROCEPHIN) 2 g in dextrose 5 % 50 mL IVPB     2 g 100 mL/hr over 30 Minutes Intravenous  Once 10/21/15 1115 10/21/15 1714   10/20/15 1900  piperacillin-tazobactam (ZOSYN) IVPB 3.375 g  Status:  Discontinued     3.375 g 12.5 mL/hr over 240 Minutes Intravenous 3 times per day  10/20/15 1445 10/21/15 1051   10/20/15 1500  vancomycin (VANCOCIN) 1,500 mg in sodium chloride 0.9 % 500 mL IVPB     1,500 mg 250 mL/hr over 120 Minutes Intravenous STAT 10/20/15 1450 10/20/15 1710   10/20/15 1445  vancomycin (VANCOCIN) IVPB 1000 mg/200 mL premix  Status:  Discontinued     1,000 mg 200 mL/hr over 60 Minutes Intravenous STAT 10/20/15 1441 10/20/15 1450   10/20/15 1100  vancomycin (VANCOCIN) IVPB 1000 mg/200 mL premix     1,000 mg 200 mL/hr over 60 Minutes Intravenous  Once 10/20/15 1053 10/20/15 1203   10/20/15 1100  piperacillin-tazobactam (ZOSYN) IVPB 3.375 g     3.375 g 12.5 mL/hr over 240 Minutes Intravenous  Once 10/20/15 1053 10/20/15 1139      Current Facility-Administered Medications  Medication Dose Route Frequency Provider Last Rate Last Dose  . acetaminophen (TYLENOL) suppository 650 mg  650 mg Rectal Q6H PRN Fritzi Mandes, MD   650 mg at 10/30/15 1817  . antiseptic oral rinse (CPC / CETYLPYRIDINIUM CHLORIDE 0.05%) solution 7 mL  7 mL Mouth Rinse q12n4p Loletha Grayer, MD   7 mL at 11/03/15 1129  . bisacodyl (DULCOLAX)  suppository 10 mg  10 mg Rectal Daily PRN Colleen Can, MD      . ceFEPIme (MAXIPIME) 2 g in dextrose 5 % 50 mL IVPB  2 g Intravenous 3 times per day Charlett Nose, RPH   2 g at 11/04/15 5284  . chlorhexidine (PERIDEX) 0.12 % solution 15 mL  15 mL Mouth Rinse BID Loletha Grayer, MD   15 mL at 11/03/15 2222  . enoxaparin (LOVENOX) injection 40 mg  40 mg Subcutaneous Q12H Wilhelmina Mcardle, MD      . insulin aspart (novoLOG) injection 0-5 Units  0-5 Units Subcutaneous QHS Loletha Grayer, MD   0 Units at 11/02/15 2307  . insulin aspart (novoLOG) injection 0-9 Units  0-9 Units Subcutaneous TID WC Loletha Grayer, MD   2 Units at 11/04/15 [phone]  . insulin glargine (LANTUS) injection 15 Units  15 Units Subcutaneous Daily Loletha Grayer, MD   15 Units at 11/03/15 0913  . iohexol (OMNIPAQUE) 240 MG/ML injection 50 mL  50 mL Oral Once PRN Lavonia Dana, MD      . ipratropium-albuterol (DUONEB) 0.5-2.5 (3) MG/3ML nebulizer solution 3 mL  3 mL Nebulization Q6H Tanda Rockers, MD   3 mL at 11/04/15 0824  . morphine 2 MG/ML injection 2 mg  2 mg Intravenous Q4H PRN Colleen Can, MD   2 mg at 11/02/15 1935  . ondansetron (ZOFRAN) injection 4 mg  4 mg Intravenous Q6H PRN Colleen Can, MD      . potassium chloride SA (K-DUR,KLOR-CON) CR tablet 40 mEq  40 mEq Oral Once Wilhelmina Mcardle, MD      . prochlorperazine (COMPAZINE) suppository 25 mg  25 mg Rectal Q8H PRN Colleen Can, MD      . sodium chloride flush (NS) 0.9 % injection 10-40 mL  10-40 mL Intracatheter PRN Laverle Hobby, MD         Objective: Vital signs in last 24 hours: Temp:  [98.1 F (36.7 C)-99.1 F (37.3 C)] 99.1 F (37.3 C) (02/08 0900) Pulse Rate:  [72-88] 87 (02/08 0900) Resp:  [18-27] 27 (02/08 0900) BP: (112-143)/(45-65) 135/63 mmHg (02/08 0800) SpO2:  [93 %-100 %] 94 % (02/08 0900) Weight:  [286 lb 9.6 oz (130 kg)] 286 lb 9.6 oz (130 kg) (02/08 0500)  Intake/Output from previous day: 02/07 0701 - 02/08 0700 In: 2724.6 [I.V.:2074.6; IV Piggyback:650] Out: 2690 [Urine:2290; Stool:400] Intake/Output this shift:     Physical Exam  Physical Exam  General: Alert and oriented to hospital, No acute distress.  Diffuse severe anasarca. Abdomen: Soft, nontender, nondistended, no abdominal masses Neurologic: Grossly intact GU: Foley catheter in place-urine clear  Lab Results:   Recent Labs  11/02/15 0509 11/04/15 0533  WBC 33.5* 18.4*  HGB 9.3* 8.5*  HCT 29.1* 25.7*  PLT 203 187   BMET  Recent Labs  11/03/15 0539  11/03/15 1715 11/04/15 0533  NA 146*  --   --  141  K 2.8*  < > 3.4* 2.9*  CL 114*  --   --  111  CO2 29  --   --  24  GLUCOSE 255*  --   --  192*  BUN 28*  --   --  23*  CREATININE 1.06*  --   --  0.98  CALCIUM 7.5*  --   --  7.4*  < > = values in this interval not displayed.  Studies/Results: US  Renal  11/02/2015  CLINICAL DATA:  Followup  for hydronephrosis. EXAM: RENAL / URINARY TRACT ULTRASOUND COMPLETE COMPARISON:  CT, 10/27/2015.  Renal ultrasound, 10/21/2015. FINDINGS: Right Kidney: Length: 12.5 cm. Normal parenchymal echogenicity. Mild hydronephrosis similar to the prior ultrasound and CT. No mass or stone. Left Kidney: Length: 12.1 cm. Echogenicity within normal limits. No mass or hydronephrosis visualized. Bladder: Foley catheter in place. Bladder still mildly distended. No bladder mass or stone. IMPRESSION: 1. Mild right hydronephrosis similar to the prior exams. 2. No left hydronephrosis.  No renal masses or stones. Electronically Signed   By: Lajean Manes M.D.   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

## 2015-11-04 NOTE — Progress Notes (Addendum)
PCCM PROGRESS NOTE  No distress. No new complaints. + F/C  Filed Vitals:   11/04/15 0700 11/04/15 0745 11/04/15 0800 11/04/15 0900  BP: 122/57  135/63   Pulse: 87 82 81 87  Temp: 99 F (37.2 C) 99.1 F (37.3 C) 99.1 F (37.3 C) 99.1 F (37.3 C)  TempSrc:      Resp: '26 25 25 27  '$ Height:      Weight:      SpO2: 93% 96% 95% 94%  2 lpm [place]  NAD HEENT WNL. No stridor JVP not well visualized Coarse BS, no wheezes Obese, soft, NT, +BS B gangrenous feet  BMP Latest Ref Rng 11/04/2015 11/03/2015 11/03/2015  Glucose 65 - 99 mg/dL 192(H) - -  BUN 6 - 20 mg/dL 23(H) - -  Creatinine 0.44 - 1.00 mg/dL 0.98 - -  Sodium 135 - 145 mmol/L 141 - -  Potassium 3.5 - 5.1 mmol/L 2.9(LL) 3.4(L) 3.3(L)  Chloride 101 - 111 mmol/L 111 - -  CO2 22 - 32 mmol/L 24 - -  Calcium 8.9 - 10.3 mg/dL 7.4(L) - -    CBC Latest Ref Rng 11/04/2015 11/02/2015 10/31/2015  WBC 3.6 - 11.0 K/uL 18.4(H) 33.5(H) 28.4(H)  Hemoglobin 12.0 - 16.0 g/dL 8.5(L) 9.3(L) 9.9(L)  Hematocrit 35.0 - 47.0 % 25.7(L) 29.1(L) 30.6(L)  Platelets 150 - 440 K/uL 187 203 211    CXR: no new film  Results for orders placed or performed during the hospital encounter of 10/20/15  Urine culture     Status: None   Collection Time: 10/20/15  9:57 AM  Result Value Ref Range Status   Specimen Description URINE, RANDOM  Final   Special Requests NONE  Final   Culture >=100,000 COLONIES/mL PROTEUS MIRABILIS  Final   Report Status 10/22/2015 FINAL  Final   Organism ID, Bacteria PROTEUS MIRABILIS  Final      Susceptibility   Proteus mirabilis - MIC*    AMPICILLIN <=2 SENSITIVE Sensitive     GENTAMICIN <=1 SENSITIVE Sensitive     CEFTRIAXONE Value in next row Sensitive      SENSITIVE<=1    CIPROFLOXACIN Value in next row Sensitive      SENSITIVE<=0.25    IMIPENEM Value in next row Sensitive      SENSITIVE2    NITROFURANTOIN Value in next row Resistant      RESISTANT128    TRIMETH/SULFA Value in next row Sensitive      SENSITIVE<=20     PIP/TAZO Value in next row Sensitive      SENSITIVE<=4    AMPICILLIN/SULBACTAM Value in next row Sensitive      SENSITIVE<=2    * >=100,000 COLONIES/mL PROTEUS MIRABILIS  Culture, blood (routine x 2)     Status: None   Collection Time: 10/20/15  9:57 AM  Result Value Ref Range Status   Specimen Description BLOOD RIGHT WRIST  Final   Special Requests   Final    BOTTLES DRAWN AEROBIC AND ANAEROBIC ANA 1ML AER 4ML   Culture  Setup Time   Final    GRAM NEGATIVE RODS IN BOTH AEROBIC AND ANAEROBIC BOTTLES CRITICAL RESULT CALLED TO, READ BACK BY AND VERIFIED WITH: MATT MCBANE ON 10/21/15 AT 0005 [facility] CONFIRMED BY [place]    Culture   Final    PROTEUS MIRABILIS IN BOTH AEROBIC AND ANAEROBIC BOTTLES    Report Status 10/22/2015 FINAL  Final   Organism ID, Bacteria PROTEUS MIRABILIS  Final      Susceptibility  Proteus mirabilis - MIC*    AMPICILLIN/SULBACTAM Value in next row Sensitive      SENSITIVE<=2    PIP/TAZO Value in next row Sensitive      SENSITIVE<=4    CEFTRIAXONE Value in next row Sensitive      SENSITIVE<=1    IMIPENEM Value in next row Sensitive      SENSITIVE2    GENTAMICIN Value in next row Sensitive      SENSITIVE<=1    CIPROFLOXACIN Value in next row Sensitive      SENSITIVE<=0.25    * PROTEUS MIRABILIS  Culture, blood (routine x 2)     Status: None   Collection Time: 10/20/15  9:57 AM  Result Value Ref Range Status   Specimen Description BLOOD LEFT HAND  Final   Special Requests   Final    BOTTLES DRAWN AEROBIC AND ANAEROBIC AER 3ML ANA 2ML   Culture  Setup Time   Final    GRAM NEGATIVE RODS IN BOTH AEROBIC AND ANAEROBIC BOTTLES CRITICAL VALUE NOTED.  VALUE IS CONSISTENT WITH PREVIOUSLY REPORTED AND CALLED VALUE.    Culture   Final    PROTEUS MIRABILIS IN BOTH AEROBIC AND ANAEROBIC BOTTLES    Report Status 10/22/2015 FINAL  Final   Organism ID, Bacteria PROTEUS MIRABILIS  Final      Susceptibility   Proteus mirabilis - MIC*    AMPICILLIN Value in next row  Sensitive      SENSITIVE<=2    PIP/TAZO Value in next row Sensitive      SENSITIVE<=4    CEFTAZIDIME Value in next row Sensitive      SENSITIVE<=1    CEFTRIAXONE Value in next row Sensitive      SENSITIVE<=1    CEFEPIME Value in next row Sensitive      SENSITIVE<=1    IMIPENEM Value in next row Sensitive      SENSITIVE2    GENTAMICIN Value in next row Sensitive      SENSITIVE<=1    CIPROFLOXACIN Value in next row Sensitive      SENSITIVE<=0.25    * PROTEUS MIRABILIS  Blood Culture ID Panel (Reflexed)     Status: Abnormal   Collection Time: 10/20/15  9:57 AM  Result Value Ref Range Status   Enterococcus species NOT DETECTED NOT DETECTED Final   Listeria monocytogenes NOT DETECTED NOT DETECTED Final   Staphylococcus species NOT DETECTED NOT DETECTED Final   Staphylococcus aureus NOT DETECTED NOT DETECTED Final   Streptococcus species NOT DETECTED NOT DETECTED Final   Streptococcus agalactiae NOT DETECTED NOT DETECTED Final   Streptococcus pneumoniae NOT DETECTED NOT DETECTED Final   Streptococcus pyogenes NOT DETECTED NOT DETECTED Final   Acinetobacter baumannii NOT DETECTED NOT DETECTED Final   Enterobacteriaceae species DETECTED (A) NOT DETECTED Final    Comment: CRITICAL RESULT CALLED TO, READ BACK BY AND VERIFIED WITH: MATT MCBANE ON 10/21/15 AT 0005 [place]    Enterobacter cloacae complex NOT DETECTED NOT DETECTED Final   Escherichia coli NOT DETECTED NOT DETECTED Final   Klebsiella oxytoca NOT DETECTED NOT DETECTED Final   Klebsiella pneumoniae NOT DETECTED NOT DETECTED Final   Proteus species (A) NOT DETECTED Final    CRITICAL RESULT CALLED TO, READ BACK BY AND VERIFIED WITH:    Comment: MATT MCBANE ON 10/21/15 AT 0005 [place]   Serratia marcescens NOT DETECTED NOT DETECTED Final   Haemophilus influenzae NOT DETECTED NOT DETECTED Final   Neisseria meningitidis NOT DETECTED NOT DETECTED Final   Pseudomonas  aeruginosa NOT DETECTED NOT DETECTED Final   Candida albicans NOT  DETECTED NOT DETECTED Final   Candida glabrata NOT DETECTED NOT DETECTED Final   Candida krusei NOT DETECTED NOT DETECTED Final   Candida parapsilosis NOT DETECTED NOT DETECTED Final   Candida tropicalis NOT DETECTED NOT DETECTED Final   Carbapenem resistance NOT DETECTED NOT DETECTED Final   Methicillin resistance NOT DETECTED NOT DETECTED Final   Vancomycin resistance NOT DETECTED NOT DETECTED Final  MRSA PCR Screening     Status: None   Collection Time: 10/20/15  7:41 PM  Result Value Ref Range Status   MRSA by PCR NEGATIVE NEGATIVE Final    Comment:        The GeneXpert MRSA Assay (FDA approved for NASAL specimens only), is one component of a comprehensive MRSA colonization surveillance program. It is not intended to diagnose MRSA infection nor to guide or monitor treatment for MRSA infections.   CULTURE, BLOOD (ROUTINE X 2) w Reflex to PCR ID Panel     Status: None   Collection Time: 10/22/15 11:39 AM  Result Value Ref Range Status   Specimen Description BLOOD LEFT HAND  Final   Special Requests BOTTLES DRAWN AEROBIC AND ANAEROBIC 1CC  Final   Culture NO GROWTH 5 DAYS  Final   Report Status 10/27/2015 FINAL  Final  Culture, expectorated sputum-assessment     Status: None   Collection Time: 10/22/15 12:00 PM  Result Value Ref Range Status   Specimen Description SPUTUM  Final   Special Requests Normal  Final   Sputum evaluation THIS SPECIMEN IS ACCEPTABLE FOR SPUTUM CULTURE  Final   Report Status 10/22/2015 FINAL  Final  Culture, respiratory (NON-Expectorated)     Status: None   Collection Time: 10/22/15 12:00 PM  Result Value Ref Range Status   Specimen Description SPUTUM  Final   Special Requests Normal Reflexed from [id number]  Final   Gram Stain   Final    FAIR SPECIMEN - 70-80% WBCS FEW WBC SEEN RARE YEAST RARE GRAM NEGATIVE RODS    Culture LIGHT GROWTH ENTEROBACTER AEROGENES  Final   Report Status 10/24/2015 FINAL  Final   Organism ID, Bacteria ENTEROBACTER  AEROGENES  Final      Susceptibility   Enterobacter aerogenes - MIC*    CEFAZOLIN >=64 RESISTANT Resistant     CEFEPIME <=1 SENSITIVE Sensitive     CEFTAZIDIME <=1 SENSITIVE Sensitive     CEFTRIAXONE <=1 SENSITIVE Sensitive     CIPROFLOXACIN <=0.25 SENSITIVE Sensitive     GENTAMICIN <=1 SENSITIVE Sensitive     IMIPENEM 2 SENSITIVE Sensitive     TRIMETH/SULFA <=20 SENSITIVE Sensitive     PIP/TAZO <=4 SENSITIVE Sensitive     * LIGHT GROWTH ENTEROBACTER AEROGENES  Urine culture     Status: None   Collection Time: 10/22/15 12:45 PM  Result Value Ref Range Status   Specimen Description URINE, RANDOM  Final   Special Requests NONE  Final   Culture NO GROWTH 2 DAYS  Final   Report Status 10/24/2015 FINAL  Final  Urine culture     Status: None   Collection Time: 10/28/15  2:36 PM  Result Value Ref Range Status   Specimen Description URINE, RANDOM  Final   Special Requests NONE  Final   Culture NO GROWTH 2 DAYS  Final   Report Status 10/30/2015 FINAL  Final   Anti-infectives    Start     Dose/Rate Route Frequency Ordered Stop   11/02/15 2200  ceFEPIme (MAXIPIME) 2 g in dextrose 5 % 50 mL IVPB    Comments:  Pharmacy please adjust dosing as needed.   2 g 100 mL/hr over 30 Minutes Intravenous 3 times per day 11/02/15 1257     10/28/15 2200  ceFEPIme (MAXIPIME) 2 g in dextrose 5 % 50 mL IVPB  Status:  Discontinued    Comments:  Pharmacy please adjust dosing as needed.   2 g 100 mL/hr over 30 Minutes Intravenous Every 12 hours 10/28/15 1420 11/02/15 1257   10/28/15 1000  ceFEPIme (MAXIPIME) 1 g in dextrose 5 % 50 mL IVPB  Status:  Discontinued    Comments:  Pharmacy please adjust dosing as needed.   1 g 100 mL/hr over 30 Minutes Intravenous Every 12 hours 10/28/15 0845 10/28/15 1420   10/26/15 1800  cefTRIAXone (ROCEPHIN) 2 g in dextrose 5 % 50 mL IVPB  Status:  Discontinued     2 g 100 mL/hr over 30 Minutes Intravenous Every 24 hours 10/26/15 1118 10/28/15 1132   10/25/15 1800   meropenem (MERREM) 500 mg in sodium chloride 0.9 % 50 mL IVPB  Status:  Discontinued     500 mg 100 mL/hr over 30 Minutes Intravenous Every 24 hours 10/24/15 1025 10/26/15 1050   10/23/15 1230  meropenem (MERREM) 1 g in sodium chloride 0.9 % 100 mL IVPB  Status:  Discontinued     1 g 200 mL/hr over 30 Minutes Intravenous Every 12 hours 10/23/15 1140 10/24/15 1025   10/22/15 1800  cefTRIAXone (ROCEPHIN) 2 g in dextrose 5 % 50 mL IVPB  Status:  Discontinued     2 g 100 mL/hr over 30 Minutes Intravenous Every 24 hours 10/21/15 1115 10/23/15 1136   10/21/15 1600  cefTRIAXone (ROCEPHIN) 2 g in dextrose 5 % 50 mL IVPB     2 g 100 mL/hr over 30 Minutes Intravenous  Once 10/21/15 1115 10/21/15 1714   10/20/15 1900  piperacillin-tazobactam (ZOSYN) IVPB 3.375 g  Status:  Discontinued     3.375 g 12.5 mL/hr over 240 Minutes Intravenous 3 times per day 10/20/15 1445 10/21/15 1051   10/20/15 1500  vancomycin (VANCOCIN) 1,500 mg in sodium chloride 0.9 % 500 mL IVPB     1,500 mg 250 mL/hr over 120 Minutes Intravenous STAT 10/20/15 1450 10/20/15 1710   10/20/15 1445  vancomycin (VANCOCIN) IVPB 1000 mg/200 mL premix  Status:  Discontinued     1,000 mg 200 mL/hr over 60 Minutes Intravenous STAT 10/20/15 1441 10/20/15 1450   10/20/15 1100  vancomycin (VANCOCIN) IVPB 1000 mg/200 mL premix     1,000 mg 200 mL/hr over 60 Minutes Intravenous  Once 10/20/15 1053 10/20/15 1203   10/20/15 1100  piperacillin-tazobactam (ZOSYN) IVPB 3.375 g     3.375 g 12.5 mL/hr over 240 Minutes Intravenous  Once 10/20/15 1053 10/20/15 1139       IMPRESSION: VDRF - resolved Stridor - resolved Hypoxic respiratory failure - improving Septic shock - resolved Bilateral gangrenous feet - will likely need amputations AKI - Cr has stabilized. Probably a component of CKD R hydronephrosis, s/p ureteral stent placement 02/07 Severe hypernatremia - resolved Hypokalemia - repletion ordered Protein-calorie  malnutrition Hyperglycemia - probably DM2 not previously diagnosed. Improved control Proteus UTI and bacteremia Persistent leukocytosis - improving after ureteral stent placement Mild anemia without acute blood loss   PLAN/REC: Cont supplemental O2 to maintain SpO2 > 92% Will need Dr Lucky Cowboy to see again in the next few days re: gangrenous feet Monitor  BMET intermittently Monitor I/Os Correct electrolytes as indicated  D5W infusion stopped 02/08 DVT px: SQ heparin Monitor CBC intermittently Transfuse per usual guidelines Abx per ID service Cont to promote nutrition Cont Lantus and SSI   PCCM will sign off. Please call if we can be of further assistance   Merton Border, MD PCCM service Mobile [phone] Pager [phone]

## 2015-11-04 NOTE — Progress Notes (Signed)
[facility] Electrolyte Replacement Protocol  Patient Name: April [name] DOB: Jan 29, 1946 MRN: [id number]  Date of Service  11/04/2015   HPI/Events of Note    Recent Labs Lab 10/30/15 0431  10/31/15 0649  11/01/15 0455 11/01/15 1300 11/02/15 0509 11/03/15 0539  11/03/15 1715 11/04/15 0533  NA 157*  < >  --   < > 156* 151* 150* 146*  --   --  141  K 3.7  < >  --   < > 3.3* 3.4* 3.5 2.8*  < > 3.4* 2.9*  CL 121*  < >  --   < > 123* 121* 119* 114*  --   --  111  CO2 31  < >  --   < > '30 27 25 29  ' --   --  24  GLUCOSE 171*  < >  --   < > 144* 185* 184* 255*  --   --  192*  BUN 54*  < >  --   < > 39* 34* 30* 28*  --   --  23*  CREATININE 1.33*  < >  --   < > 1.09* 1.12* 0.98 1.06*  --   --  0.98  CALCIUM 8.1*  < >  --   < > 7.6* 7.6* 7.7* 7.5*  --   --  7.4*  MG 2.1  --  2.0  --  2.0  --   --  1.8  --   --  2.0  PHOS 3.3  --   --   --  3.2  --   --  3.6  --   --  2.8  < > = values in this interval not displayed.  Estimated Creatinine Clearance: 76.1 mL/min (by C-G formula based on Cr of 0.98).  Intake/Output      02/07 0701 - 02/08 0700   I.V. (mL/kg) 1174.6 (9)   IV Piggyback 650   Total Intake(mL/kg) 1824.6 (14)   Urine (mL/kg/hr) 2290 (0.7)   Stool 400 (0.1)   Total Output 2690   Net -865.4        - I/O DETAILED x24h    Total I/O In: 100 [IV Piggyback:100] Out: 1100 [Urine:1100] - I/O THIS SHIFT    ASSESSMENT   eICURN Interventions  Electrolyte protocol criteria met.  Replace per protocol.  MD notified   ASSESSMENT: MAJOR ELECTROLYTE    Lorene Dy 11/04/2015, 6:44 AM

## 2015-11-05 LAB — CBC
HEMATOCRIT: 28 % — AB (ref 35.0–47.0)
Hemoglobin: 9.3 g/dL — ABNORMAL LOW (ref 12.0–16.0)
MCH: 30.2 pg (ref 26.0–34.0)
MCHC: 33.1 g/dL (ref 32.0–36.0)
MCV: 91.3 fL (ref 80.0–100.0)
Platelets: 216 10*3/uL (ref 150–440)
RBC: 3.07 MIL/uL — ABNORMAL LOW (ref 3.80–5.20)
RDW: 14.2 % (ref 11.5–14.5)
WBC: 19.4 10*3/uL — ABNORMAL HIGH (ref 3.6–11.0)

## 2015-11-05 LAB — GLUCOSE, CAPILLARY
GLUCOSE-CAPILLARY: 107 mg/dL — AB (ref 65–99)
Glucose-Capillary: 245 mg/dL — ABNORMAL HIGH (ref 65–99)
Glucose-Capillary: 163 mg/dL — ABNORMAL HIGH (ref 65–99)
Glucose-Capillary: 163 mg/dL — ABNORMAL HIGH (ref 65–99)

## 2015-11-05 LAB — BASIC METABOLIC PANEL
Anion gap: 3 — ABNORMAL LOW (ref 5–15)
BUN: 22 mg/dL — AB (ref 6–20)
CHLORIDE: 115 mmol/L — AB (ref 101–111)
CO2: 28 mmol/L (ref 22–32)
CREATININE: 0.92 mg/dL (ref 0.44–1.00)
Calcium: 7.8 mg/dL — ABNORMAL LOW (ref 8.9–10.3)
GFR calc Af Amer: >60 mL/min (ref >60)
GFR calc non Af Amer: >60 mL/min (ref >60)
Glucose, Bld: 129 mg/dL — ABNORMAL HIGH (ref 65–99)
Potassium: 3.6 mmol/L (ref 3.5–5.1)
SODIUM: 146 mmol/L — AB (ref 135–145)

## 2015-11-05 LAB — MAGNESIUM: Magnesium: 1.8 mg/dL (ref 1.7–2.4)

## 2015-11-05 MED ORDER — POTASSIUM CHLORIDE 20 MEQ PO PACK
20.0000 meq | PACK | Freq: Once | ORAL | Status: AC
  Administered 2015-11-05: 20 meq via ORAL
  Filled 2015-11-05: qty 1

## 2015-11-05 MED ORDER — DEXTROSE 5 % IV SOLN
INTRAVENOUS | Status: DC
  Administered 2015-11-05: 12:00:00 via INTRAVENOUS

## 2015-11-05 MED ORDER — FERROUS SULFATE 325 (65 FE) MG PO TABS
325.0000 mg | ORAL_TABLET | Freq: Two times a day (BID) | ORAL | Status: DC
  Administered 2015-11-05 – 2015-11-09 (×7): 325 mg via ORAL
  Filled 2015-11-05 (×8): qty 1

## 2015-11-05 NOTE — NC FL2 (Signed)
[place] LEVEL OF CARE SCREENING TOOL     IDENTIFICATION  Patient Name: WRIGLEY WINBORNE Birthdate: [birth year] Sex: female Admission Date (Current Location): 10/20/2015  [facility] and [place] Number:  Engineering geologist and Address:  [facility], [address] [place], [place] [address]      Provider Number: [id number]  Attending Physician Name and Address:  Loletha Grayer, MD  Relative Name and Phone Number:       Current Level of Care: Hospital Recommended Level of Care: [place] Prior Approval Number:    Date Approved/Denied:   PASRR Number:    Discharge Plan: SNF    Current Diagnoses: Patient Active Problem List   Diagnosis Date Noted  . Respiratory failure ([place])   . Proteus septicemia ([place])   . Acute renal failure (ARF) ([place])   . Hydronephrosis   . Endotracheally intubated   . Respiratory distress   . Arterial hypotension   . Sepsis ([place]) 10/20/2015  . Acute renal failure ([place])   . Altered mental status   . NSTEMI (non-ST elevated myocardial infarction) ([place])   . Sepsis due to urinary tract infection ([facility])     Orientation RESPIRATION BLADDER Height & Weight     Self, Place  Normal Continent Weight: 267 lb 14.4 oz (121.519 kg) Height:  '5\' 7"'$  (170.2 cm)  BEHAVIORAL SYMPTOMS/MOOD NEUROLOGICAL BOWEL NUTRITION STATUS   (none)  (none) Continent Diet  AMBULATORY STATUS COMMUNICATION OF NEEDS Skin   Total Care Verbally                         Personal Care Assistance Level of Assistance  Bathing, Dressing Bathing Assistance: Maximum assistance   Dressing Assistance: Maximum assistance     Functional Limitations Info             SPECIAL CARE FACTORS FREQUENCY  PT (By licensed PT)                    Contractures Contractures Info: Not present    Additional Factors Info                  Current Medications (11/05/2015):  This is the current hospital active medication  list Current Facility-Administered Medications  Medication Dose Route Frequency Provider Last Rate Last Dose  . acetaminophen (TYLENOL) suppository 650 mg  650 mg Rectal Q6H PRN Fritzi Mandes, MD   650 mg at 10/30/15 1817  . antiseptic oral rinse (CPC / CETYLPYRIDINIUM CHLORIDE 0.05%) solution 7 mL  7 mL Mouth Rinse BID Loletha Grayer, MD   7 mL at 11/05/15 1030  . bisacodyl (DULCOLAX) suppository 10 mg  10 mg Rectal Daily PRN Colleen Can, MD      . cefTRIAXone (ROCEPHIN) 2 g in dextrose 5 % 50 mL IVPB  2 g Intravenous Q24H Lenis Noon, RPH   2 g at 11/04/15 1858  . enoxaparin (LOVENOX) injection 40 mg  40 mg Subcutaneous Q12H Wilhelmina Mcardle, MD   40 mg at 11/05/15 1030  . insulin aspart (novoLOG) injection 0-5 Units  0-5 Units Subcutaneous QHS Loletha Grayer, MD   0 Units at 11/02/15 2307  . insulin aspart (novoLOG) injection 0-9 Units  0-9 Units Subcutaneous TID WC Loletha Grayer, MD   3 Units at 11/05/15 1147  . insulin glargine (LANTUS) injection 15 Units  15 Units Subcutaneous Daily Loletha Grayer, MD   15 Units at 11/05/15 1031  .  iohexol (OMNIPAQUE) 240 MG/ML injection 50 mL  50 mL Oral Once PRN Lavonia Dana, MD      . ipratropium-albuterol (DUONEB) 0.5-2.5 (3) MG/3ML nebulizer solution 3 mL  3 mL Nebulization Q6H Tanda Rockers, MD   3 mL at 11/05/15 1339  . morphine 2 MG/ML injection 2 mg  2 mg Intravenous Q4H PRN Colleen Can, MD   2 mg at 11/02/15 1935  . ondansetron (ZOFRAN) injection 4 mg  4 mg Intravenous Q6H PRN Colleen Can, MD      . prochlorperazine (COMPAZINE) suppository 25 mg  25 mg Rectal Q8H PRN Colleen Can, MD      . sodium chloride flush (NS) 0.9 % injection 10-40 mL  10-40 mL Intracatheter PRN Laverle Hobby, MD         Discharge Medications: Please see discharge summary for a list of discharge medications.  Relevant Imaging Results:  Relevant Lab Results:   Additional Information SS: [id number]  Shela Leff,  LCSW

## 2015-11-05 NOTE — Progress Notes (Signed)
Speech Language Pathology Treatment: Dysphagia  Patient Details Name: April [name] MRN: [id number] DOB: 1946/04/24 Today's Date: 11/05/2015 Time: 1002-1040 SLP Time Calculation (min) (ACUTE ONLY): 38 min  Assessment / Plan / Recommendation Clinical Impression  Pt appeared to tolerate trials of thin liquids via cup and purees w/ no immediate, overt s/s of aspiration noted. Pt exhibited no overt coughing but audible swallows and a delayed throat clear post belching was noted x1 post trials of thin liquids; no decline in O2 sats during po's. Noted min. increased respiratory effort and RR which responded well to rest breaks given every few boluses. No deficits were noted during the oral phase. No trials of solids were assessed at this session. Pt appears at min. increased risk for aspiration sec. to her baseline respiratory effort and fatigue but this is reduced following rec'd diet w/ aspiration precautions; thin liquids via cup (NO Straws); meds in puree; feeding assistance at meals.    HPI HPI: Pt is a 70 y.o. female with no past medical history comes to the emergency room after she was found lying on the floor minimally responsive. Patient had not shown up to work for 2 days and coworkers checked on her and they found her lying on the floor. Down time not known. She was reportedly severely swollen at baseline by coworker. Pt was emergently intubated upon arrival to the ED. She has since been extubated on Feb. 2 but has required BiPAP support intermittently and has not been able to participate in a BSE. There is some question that pt is not at her baseline mental status as well. Pt continues to have respiratory issues requiring increased O2 support per RT and NSG. She had stents placed in the kidneys yesterday per chart notes. At rest, pt exhibits min. increased respiratory effort w/ exertion: O2 sats 94%, RR low 20's. Pt followed basic commands; conversed about self and preferences. Mild cough but more  productive at times. Pt is tolerating her current dysphagia 1 diet w/ thin liquids per report; no overt s/s of aspiration have been noted. Pt is able to follow along w/ general conversation today; some confusion noted in some exchanges.       SLP Plan  Continue with current plan of care     Recommendations  Diet recommendations: Dysphagia 1 (puree);Thin liquid Liquids provided via: Cup;No straw Medication Administration: Crushed with puree Supervision: Staff to assist with self feeding;Full supervision/cueing for compensatory strategies Compensations: Minimize environmental distractions;Slow rate;Small sips/bites;Follow solids with liquid Postural Changes and/or Swallow Maneuvers: Seated upright 90 degrees             Oral Care Recommendations: Oral care BID;Staff/trained caregiver to provide oral care Follow up Recommendations: Skilled Nursing facility Plan: Continue with current plan of care     [place], [place], CCC-SLP  April [name] 11/05/2015, 4:30 PM

## 2015-11-05 NOTE — Care Management Important Message (Signed)
Important Message  Patient Details  Name: April [name] MRN: [id number] Date of Birth: 1946/06/29   Medicare Important Message Given:  Yes    Beau Fanny, RN 11/05/2015, 9:23 AM

## 2015-11-05 NOTE — Evaluation (Signed)
Physical Therapy Evaluation Patient Details Name: April [name] MRN: [id number] DOB: 1945-10-02 Today's Date: 11/05/2015   History of Present Illness  presented to ER after being found unresponsive for unknown period of time (missed work for two days); admitted with septic shock seconary to acute renal failure, NSTEMI.  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  Clinical Impression  Upon evaluation, patient alert and oriented to basic information; follows simple commands and demonstrates good motivation to participate with session as able.  Patient with significant functional weakness and global deconditioning, requiring act assist from therapist for movement of any/all extremities.  As result, currently dependent for all functional activities (including self-care, repositioning) and unable to tolerate activities beyond bed-level UE/LE therex.   Patient also noted with significant vascular changes to LEs (likely requiring amputation in future per notes); will need to clarify WBing/mobility restrictions with vascular as patient appropriate for additional mobility attempts. Would benefit from skilled PT to address above deficits and promote optimal return to PLOF; recommend transition to STR upon discharge from acute hospitalization.  Anticipate prolonged recovery time given multiple system involvement at this time.     Follow Up Recommendations SNF    Equipment Recommendations       Recommendations for Other Services OT consult     Precautions / Restrictions Precautions Precautions: Fall Restrictions Weight Bearing Restrictions: No       Mobility  Bed Mobility               General bed mobility comments: dep for all functional activity at this time due to generalized weakness and deconditioning  Transfers                 General transfer comment: unable to tolerate at this time due to significant fatigue with simple, supine activities  Ambulation/Gait             General Gait Details: unable to tolerate at this time due to significant fatigue with simple, supine activities  Stairs            Wheelchair Mobility    Modified Rankin (Stroke Patients Only)       Balance                                             Pertinent Vitals/Pain Pain Assessment: No/denies pain    Home Living Family/patient expects to be discharged to:: Private residence Living Arrangements: Alone   Type of Home: House Home Access: Stairs to enter Entrance Stairs-Rails: Right Entrance Stairs-Number of Steps: 2 Home Layout: One level Home Equipment: None      Prior Function Level of Independence: Independent         Comments: Indep with household and community mobility without assist device; + driving; working part-time at [employer]        Extremity/Trunk Assessment   Upper Extremity Assessment: Generalized weakness (Bilat UEs grossly edematous, strength 2+ to 3-/5; globally weak and deconditioned.  Act assist ROM grossly WFL)           Lower Extremity Assessment: Generalized weakness (Bilat LEs  generally 2+/2, except bilat ankles 3-/5.  Generally edematous with significant vascular/gangrenous changes to distal LEs.)         Communication   Communication: No difficulties  Cognition Arousal/Alertness: Awake/alert Behavior During Therapy: WFL for tasks assessed/performed Overall Cognitive Status: Within Functional Limits for tasks assessed                      General Comments      Exercises Other Exercises Other Exercises: Bilat UE supine  therex, 1x8, act assist ROM: shoulder flex/ext/abduct/adduct, elbow flex/ext, forearm pronation/supination and gross grasp/release.  Bilat UEs elevated on pillows for edema control; requested OT consult per RN to address profound weakness and ADL deficits. Other Exercises: Bilat LE supine therex, 1x5, act assist ROM: ankle PF/DF, heel slides and hip abduct/adduct.      Assessment/Plan    PT Assessment Patient needs continued PT services  PT Diagnosis Difficulty walking;Generalized weakness   PT Problem List Decreased strength;Decreased range of motion;Decreased activity tolerance;Decreased balance;Decreased mobility;Decreased knowledge of use of DME;Decreased safety awareness;Decreased knowledge of precautions;Cardiopulmonary status limiting activity;Obesity;Decreased skin integrity  PT Treatment Interventions DME instruction;Gait training;Stair training;Functional mobility training;Therapeutic activities;Therapeutic exercise;Balance training;Patient/family education   PT Goals (Current goals can be found in the Care Plan section) Acute Rehab PT Goals Patient Stated Goal: "to do what I can" PT Goal Formulation: With patient Time For Goal Achievement: 11/19/15 Potential to Achieve Goals: Fair Additional Goals Additional Goal #1: Assess and establish goals for OOB and mobility as appropriate.    Frequency Min 2X/week   Barriers to discharge Decreased caregiver support;Inaccessible home environment      Co-evaluation               End of Session   Activity Tolerance: Patient limited by fatigue Patient left: in bed;with call bell/phone within reach;with nursing/sitter in room (nursing at bedside for hygiene after incontinent episode) Nurse Communication: Mobility status         Time: 1610-9604 PT Time Calculation (min) (ACUTE ONLY): 21 min   Charges:   PT Evaluation $PT Eval High Complexity: 1 Procedure PT Treatments $Therapeutic Exercise: 8-22 mins   PT G Codes:        Letishia Elliott H. Owens Shark, PT, DPT, NCS 11/05/2015, 1:30 PM [phone]

## 2015-11-05 NOTE — Clinical Social Work Note (Signed)
Clinical Social Work Assessment  Patient Details  Name: April [name] MRN: [id number] Date of Birth: 1946/08/11  Date of referral:  11/05/15               Reason for consult:  Facility Placement                Permission sought to share information with:  Facility Art therapist granted to share information::  Yes, Verbal Permission Granted (verbal permission by patient's cousin: April [name]: [phone])  Name::        Agency::     Relationship::     Contact Information:     Housing/Transportation Living arrangements for the past 2 months:  Single Family Home Source of Information:  Other (Comment Required) (cousin) Patient Interpreter Needed:  None Criminal Activity/Legal Involvement Pertinent to Current Situation/Hospitalization:  No - Comment as needed Significant Relationships:   (cousin) Lives with:  Self Do you feel safe going back to the place where you live?    Need for family participation in patient care:     Care giving concerns:  Patient resides alone and is not able to care for herself.   Social Worker assessment / plan:  Patient transferred from ICU to [facility] last evening. Patient has been evaluated by PT and they are recommending STR at discharge. Patient is intermittently confused. CSW has spoken to patient's cousin: April [name] today and introduced myself and explained the role of CSW. April [name] is aware that STR will need to be pursued. Bedsearch to be initiated.  Employment status:    Insurance informationEducational psychologist PT Recommendations:  [place] / Referral to community resources:  [place]  Patient/Family's Response to care:  Patient's cousin expressed appreciation for CSW assistance.  Patient/Family's Understanding of and Emotional Response to Diagnosis, Current Treatment, and Prognosis:  Patient's cousin did not express much emotion during conversation but was very polite and  pleasant.  Emotional Assessment Appearance:    Attitude/Demeanor/Rapport:    Affect (typically observed):    Orientation:  Oriented to Self Alcohol / Substance use:  Not Applicable Psych involvement (Current and /or in the community):  No (Comment)  Discharge Needs  Concerns to be addressed:  Care Coordination Readmission within the last 30 days:  No Current discharge risk:  None Barriers to Discharge:  No Barriers Identified   Shela Leff, LCSW 11/05/2015, 2:39 PM

## 2015-11-05 NOTE — Progress Notes (Signed)
[facility]  Daily Progress Note   Subjective  -   Very deconditioned. Laying quietly in bed. Gangrenous changes to both feet as below. He denies significant pain. Fever curve is down after decompression of her urosepsis. White cell count down as well. Overall slowly improving.  Objective Filed Vitals:   11/05/15 1232 11/05/15 1320 11/05/15 1339 11/05/15 1345  BP: 146/67     Pulse: 88 93    Temp: 98.2 F (36.8 C)     TempSrc: Oral     Resp: 20     Height:      Weight:      SpO2: 98% 93% 97% 92%    Intake/Output Summary (Last 24 hours) at 11/05/15 1536 Last data filed at 11/05/15 1300  Gross per 24 hour  Intake    945 ml  Output   1300 ml  Net   -355 ml    PULM  CTAB CV  RRR VASC  Has gangrenous changes to both feet.  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 The tops of the foot likely have irreversible ischemia with modeling up around the ankle bilaterally.   Laboratory CBC    Component Value Date/Time   WBC 19.4* 11/05/2015 0500   HGB 9.3* 11/05/2015 0500   HCT 28.0* 11/05/2015 0500   PLT 216 11/05/2015 0500    BMET    Component Value Date/Time   NA 146* 11/05/2015 0500   K 3.6 11/05/2015 0500   CL 115* 11/05/2015 0500   CO2 28 11/05/2015 0500   GLUCOSE 129* 11/05/2015 0500   BUN 22* 11/05/2015 0500   CREATININE 0.92 11/05/2015 0500   CALCIUM 7.8* 11/05/2015 0500   GFRNONAA >60 11/05/2015 0500   GFRAA >60 11/05/2015 0500    Assessment/Planning: S/p MSOF  Now on the floor and recovering, but very weak Had urosepsis.  Now resolving after decompression   Has gangrenous changes to both feet.  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 The tops of the foot likely have irreversible ischemia with modeling up around the ankle bilaterally. She has significant ulcerations bilaterally as well. Her perfusion is now intact, and this was more from a hypotension, vasopressor, multisystem organ failure scenario than true peripheral arterial  disease.  I discussed with her at length that she will have to have a minimum transmetatarsal amputations and more likely would be below knee amputations bilaterally. I do not think the tissue around her ankle and proximal foot is viable, but it is not entirely dead and I would recommend more time to allow this to demarcate to see if enough tissue is salvageable to keep this at a transmetatarsal level bilaterally. This would be a major difference and morbidity over bilateral below-knee amputations. As long as her pain is not severe which she says it is not, and as long as there are no signs of myoglobin anemia, renal failure, or sepsis from her legs. This may take several weeks. I discussed that I will follow her loosely while we are in the hospital, but if more severe need for immediate amputation arises please contact me. I can plan to see her back in 2-3 weeks in the office. Allowing demarcation gives her the best chance of tissue salvage as much as possible.    DEW,JASON  11/05/2015, 3:36 PM

## 2015-11-05 NOTE — Progress Notes (Signed)
[place] [facility] Electrolyte [facility] Indication: hypokalemia    Allergies  Allergen Reactions  . Prednisone Anaphylaxis    Patient Measurements: Height: '5\' 7"'$  (170.2 cm) Weight: 267 lb 14.4 oz (121.519 kg) IBW/kg (Calculated) : 61.6   Vital Signs: Temp: 98 F (36.7 C) (02/09 0605) Temp Source: Oral (02/09 0605) BP: 157/62 mmHg (02/09 0605) Pulse Rate: 91 (02/09 0605) Intake/Output from previous day: 02/08 0701 - 02/09 0700 In: 465 [IV Piggyback:255] Out: 900 [Urine:900] Intake/Output from this shift: Total I/O In: -  Out: 400 [Urine:400] Vent settings for last 24 hours:    Labs:  Recent Labs  11/03/15 0539 11/04/15 0533 11/05/15 0500  WBC  --  18.4* 19.4*  HGB  --  8.5* 9.3*  HCT  --  25.7* 28.0*  PLT  --  187 216  CREATININE 1.06* 0.98 0.92  MG 1.8 2.0 1.8  PHOS 3.6 2.8  --   ALBUMIN 1.6*  --   --   PROT 5.4*  --   --   AST 20  --   --   ALT 26  --   --   ALKPHOS 87  --   --   BILITOT 0.7  --   --    Estimated Creatinine Clearance: 78 mL/min (by C-G formula based on Cr of 0.92).   Assessment: 70 y/o F with ARF recently transitioned from CRRT to HD not currently requiring HD.   2/8: Potassium low at 2.9 this AM and replaced with 4 runs of KCl 10 meq and 40 meq KCl po. Will recheck potasium at 1800. --> only got 3 runs, no PO  2/8 PM K+ 3.3. KCl 10 mEq IV x 2 doses ordered. BMP scheduled in AM.  2/9 K 3.6, Mg 1.8  Plan:  K WNL but has been low the past few days. CrCl 78 ml/min. Will order KCl 20 mEq PO x1. BMET in AM to f/u.   Pharmacy will continue to monitor and adjust per consult.     Rayna Sexton, PharmD, BCPS Clinical Pharmacist 11/05/2015 9:17 AM

## 2015-11-05 NOTE — Progress Notes (Signed)
11/05/2015  16:00  Nurse tech Lurline Idol called me into pt's room.  Charge nurse Desmond Lope was at the bedside as well.  When performing pericare, nurse tech discovered presence of blood.  There was no blood in the foley catheter tubing.  It was possibly 50cc of blood noted.  We were unable to determine if the source was rectal or vaginal at this time.  Notified Dr. Leslye Peer.  He advised no further interventions at this time, but we will cnotinue to monitor and assess for further bleeding until tomorrow.  Dola Argyle, RN

## 2015-11-05 NOTE — Evaluation (Signed)
Occupational Therapy Evaluation Patient Details Name: April [name] MRN: [id number] DOB: [birth year] Today's Date: 11/05/2015    History of Present Illness 70 year old female presented to [facility] ED after being found unresponsive for unknown period of time (missed work for two days); admitted with septic shock secondary to acute renal failure, NSTEMI.  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   Clinical Impression   This patient is a 70 year old female who came to [facility]  with the above history. She lives alone and works. She had been independent with all activities of daily living and functional mobility. She shows significant deficits in range of motion, strength, mobility, hand edema, and activities of daily living. She would benefit from Occupational Therapy for ADL/functional mobility training, upper extremity restoration, and hand edema control.     Follow Up Recommendations  SNF    Equipment Recommendations       Recommendations for Other Services       Precautions / Restrictions Precautions Precautions: Fall Restrictions Weight Bearing Restrictions: No      Mobility Bed Mobility               General bed mobility comments: Patient dependent for mobility.   Transfers                     Balance                                            ADL                                         General ADL Comments: Had been independent and working. She now is dependent for dressing, bathing, toile ting and feeding. Earlier she had  attempted to drink (her report) and spilled all over her.      Vision     Perception     Praxis      Pertinent Vitals/Pain Pain Assessment: No/denies pain     Hand Dominance     Extremity/Trunk Assessment Upper Extremity Assessment Upper Extremity Assessment: Generalized weakness;RUE deficits/detail;LUE deficits/detail RUE Deficits / Details: Shoulder flexion 0-40o 2-/5 external rotation 0-45o 2-/5, elbow flexion 0-10o 2-/5, elbow extension full gravity eliminated  2/5, supination to neutral 2-/5, pronation full 2-/5, wrist extension trace wrist flexion 2-/5 grip 1 lbs hand motion 20%. sensation intact for light touch, sharp and temp. unable to do 9 hole peg test. LUE Deficits / Details: Shoulder flexion trace elbow flexion 2-/5, elbow extension 2/5, pronation 2-/5, wrist extension, 2-/5 sensation intact for light touch, sharp, and temp.grip is 0 lbs.    Lower Extremity Assessment Lower Extremity Assessment: Defer to PT evaluation       Communication Communication Communication: No difficulties   Cognition Arousal/Alertness: Lethargic Behavior During Therapy: WFL for tasks assessed/performed Overall Cognitive Status: Within Functional Limits for tasks assessed  General Comments       Exercises  Mobilization of fingers both hands. Anti edema massage to both hands. Stretching of fingers in flexion and extension.   Shoulder Instructions      Home Living Family/patient expects to be discharged to:: Private residence Living Arrangements: Alone   Type of Home: House Home Access: Stairs to enter [employer] of Steps: 2 Entrance Stairs-Rails: Right Home Layout: One level               Home Equipment: None          Prior Functioning/Environment Level of Independence: Independent        Comments: Indep with household and community mobility without assist device; + driving; working part-time at [employer]    OT Diagnosis:  Generalized weakness   OT Problem List: Decreased strength;Decreased range of motion;Decreased activity tolerance;Impaired balance (sitting and/or standing);Decreased coordination;Decreased knowledge of use of DME or AE;Impaired UE functional use;Increased edema   OT Treatment/Interventions: Self-care/ADL training;Therapeutic exercise;DME and/or AE instruction;Manual therapy;Therapeutic activities    OT Goals(Current goals can be found in the care plan section) Acute Rehab OT Goals Patient Stated Goal: to try OT Goal Formulation: With patient Time For Goal Achievement: 11/19/15 Potential to Achieve Goals: Good  OT Frequency: Min 1X/week   Barriers to D/C:            Co-evaluation              End of Session Equipment Utilized During Treatment:  (test kit)  Activity Tolerance: Patient limited by lethargy Patient left:     Time: 1402-1430 OT Time Calculation (min): 28 min Charges:  OT General Charges $OT Visit: 1 Procedure OT Evaluation $OT Eval High Complexity: 1 Procedure OT Treatments $Therapeutic Activity: 8-22 mins G-Codes:    Myrene Galas, MS/OTR/L  11/05/2015, 3:45 PM

## 2015-11-05 NOTE — Progress Notes (Signed)
[facility]  ROUNDING NOTE   Subjective:  Cr the same at 0.9 at the moment. K up to 3.6. Na higher today at 146 however.   Objective:  Vital signs in last 24 hours:  Temp:  [98 F (36.7 C)-99.5 F (37.5 C)] 98 F (36.7 C) (02/09 0605) Pulse Rate:  [78-91] 91 (02/09 0605) Resp:  [20-25] 20 (02/09 0605) BP: (118-157)/(52-62) 157/62 mmHg (02/09 0605) SpO2:  [90 %-98 %] 98 % (02/09 0759) Weight:  [121.519 kg (267 lb 14.4 oz)] 121.519 kg (267 lb 14.4 oz) (02/09 0500)  Weight change: -8.481 kg (-18 lb 11.2 oz) Filed Weights   11/03/15 0500 11/04/15 0500 11/05/15 0500  Weight: 119.8 kg (264 lb 1.8 oz) 130 kg (286 lb 9.6 oz) 121.519 kg (267 lb 14.4 oz)    Intake/Output: I/O last 3 completed shifts: In: 1607 [I.V.:900; Other:210; IV Piggyback:355] Out: 2000 [Urine:2000]   Intake/Output this shift:  Total I/O In: 480 [P.O.:480] Out: 400 [Urine:400]  Physical Exam: General: No acute distress  Head: [place]/AT hard of hearing OM dry  Eyes: Eyes open   Neck: supple  Lungs:  Scattered rhonchi, normal effort  Heart: Regular, no rub   Abdomen:  Soft, nontender, obese  Extremities: 3+ peripheral edema. +anasarca  Neurologic:  alert, able to follow simple commands   Skin: Feet in soft support, wounds noted b/l LE's       Basic Metabolic Panel:  Recent Labs Lab 10/30/15 0431  10/31/15 0649  11/01/15 0455 11/01/15 1300 11/02/15 0509 11/03/15 0539 11/03/15 1245 11/03/15 1715 11/04/15 0533 11/04/15 2247 11/05/15 0500  NA 157*  < >  --   < > 156* 151* 150* 146*  --   --  141  --  146*  K 3.7  < >  --   < > 3.3* 3.4* 3.5 2.8* 3.3* 3.4* 2.9* 3.3* 3.6  CL 121*  < >  --   < > 123* 121* 119* 114*  --   --  111  --  115*  CO2 31  < >  --   < > '30 27 25 29  '$ --   --  24  --  28  GLUCOSE 171*  < >  --   < > 144* 185* 184* 255*  --   --  192*  --  129*  BUN 54*  < >  --   < > 39* 34* 30* 28*  --   --  23*  --  22*  CREATININE 1.33*  < >  --   < > 1.09* 1.12* 0.98 1.06*   --   --  0.98  --  0.92  CALCIUM 8.1*  < >  --   < > 7.6* 7.6* 7.7* 7.5*  --   --  7.4*  --  7.8*  MG 2.1  --  2.0  --  2.0  --   --  1.8  --   --  2.0  --  1.8  PHOS 3.3  --   --   --  3.2  --   --  3.6  --   --  2.8  --   --   < > = values in this interval not displayed.  Liver Function Tests:  Recent Labs Lab 11/02/15 0509 11/03/15 0539  AST 20 20  ALT 29 26  ALKPHOS 80 87  BILITOT 1.0 0.7  PROT 5.8* 5.4*  ALBUMIN 1.7* 1.6*   No results for input(s): LIPASE,  AMYLASE in the last 168 hours.  Recent Labs Lab 10/31/15 0649  AMMONIA 18    CBC:  Recent Labs Lab 10/30/15 0431 10/31/15 0500 11/02/15 0509 11/04/15 0533 11/05/15 0500  WBC 29.4* 28.4* 33.5* 18.4* 19.4*  HGB 10.0* 9.9* 9.3* 8.5* 9.3*  HCT 30.8* 30.6* 29.1* 25.7* 28.0*  MCV 94.1 93.7 94.1 91.1 91.3  PLT 181 211 203 187 216    Cardiac Enzymes: No results for input(s): CKTOTAL, CKMB, CKMBINDEX, TROPONINI in the last 168 hours.  BNP: Invalid input(s): POCBNP  CBG:  Recent Labs Lab 11/04/15 0800 11/04/15 1118 11/04/15 1544 11/04/15 2207 11/05/15 0727  GLUCAP 155* 147* 142* 111* 107*    Microbiology: Results for orders placed or performed during the hospital encounter of 10/20/15  Urine culture     Status: None   Collection Time: 10/20/15  9:57 AM  Result Value Ref Range Status   Specimen Description URINE, RANDOM  Final   Special Requests NONE  Final   Culture >=100,000 COLONIES/mL PROTEUS MIRABILIS  Final   Report Status 10/22/2015 FINAL  Final   Organism ID, Bacteria PROTEUS MIRABILIS  Final      Susceptibility   Proteus mirabilis - MIC*    AMPICILLIN <=2 SENSITIVE Sensitive     GENTAMICIN <=1 SENSITIVE Sensitive     CEFTRIAXONE Value in next row Sensitive      SENSITIVE<=1    CIPROFLOXACIN Value in next row Sensitive      SENSITIVE<=0.25    IMIPENEM Value in next row Sensitive      SENSITIVE2    NITROFURANTOIN Value in next row Resistant      RESISTANT128    TRIMETH/SULFA Value  in next row Sensitive      SENSITIVE<=20    PIP/TAZO Value in next row Sensitive      SENSITIVE<=4    AMPICILLIN/SULBACTAM Value in next row Sensitive      SENSITIVE<=2    * >=100,000 COLONIES/mL PROTEUS MIRABILIS  Culture, blood (routine x 2)     Status: None   Collection Time: 10/20/15  9:57 AM  Result Value Ref Range Status   Specimen Description BLOOD RIGHT WRIST  Final   Special Requests   Final    BOTTLES DRAWN AEROBIC AND ANAEROBIC ANA 1ML AER 4ML   Culture  Setup Time   Final    GRAM NEGATIVE RODS IN BOTH AEROBIC AND ANAEROBIC BOTTLES CRITICAL RESULT CALLED TO, READ BACK BY AND VERIFIED WITH: MATT MCBANE ON 10/21/15 AT 0005 [facility] CONFIRMED BY [place]    Culture   Final    PROTEUS MIRABILIS IN BOTH AEROBIC AND ANAEROBIC BOTTLES    Report Status 10/22/2015 FINAL  Final   Organism ID, Bacteria PROTEUS MIRABILIS  Final      Susceptibility   Proteus mirabilis - MIC*    AMPICILLIN/SULBACTAM Value in next row Sensitive      SENSITIVE<=2    PIP/TAZO Value in next row Sensitive      SENSITIVE<=4    CEFTRIAXONE Value in next row Sensitive      SENSITIVE<=1    IMIPENEM Value in next row Sensitive      SENSITIVE2    GENTAMICIN Value in next row Sensitive      SENSITIVE<=1    CIPROFLOXACIN Value in next row Sensitive      SENSITIVE<=0.25    * PROTEUS MIRABILIS  Culture, blood (routine x 2)     Status: None   Collection Time: 10/20/15  9:57 AM  Result Value Ref Range  Status   Specimen Description BLOOD LEFT HAND  Final   Special Requests   Final    BOTTLES DRAWN AEROBIC AND ANAEROBIC AER 3ML ANA 2ML   Culture  Setup Time   Final    GRAM NEGATIVE RODS IN BOTH AEROBIC AND ANAEROBIC BOTTLES CRITICAL VALUE NOTED.  VALUE IS CONSISTENT WITH PREVIOUSLY REPORTED AND CALLED VALUE.    Culture   Final    PROTEUS MIRABILIS IN BOTH AEROBIC AND ANAEROBIC BOTTLES    Report Status 10/22/2015 FINAL  Final   Organism ID, Bacteria PROTEUS MIRABILIS  Final      Susceptibility   Proteus  mirabilis - MIC*    AMPICILLIN Value in next row Sensitive      SENSITIVE<=2    PIP/TAZO Value in next row Sensitive      SENSITIVE<=4    CEFTAZIDIME Value in next row Sensitive      SENSITIVE<=1    CEFTRIAXONE Value in next row Sensitive      SENSITIVE<=1    CEFEPIME Value in next row Sensitive      SENSITIVE<=1    IMIPENEM Value in next row Sensitive      SENSITIVE2    GENTAMICIN Value in next row Sensitive      SENSITIVE<=1    CIPROFLOXACIN Value in next row Sensitive      SENSITIVE<=0.25    * PROTEUS MIRABILIS  Blood Culture ID Panel (Reflexed)     Status: Abnormal   Collection Time: 10/20/15  9:57 AM  Result Value Ref Range Status   Enterococcus species NOT DETECTED NOT DETECTED Final   Listeria monocytogenes NOT DETECTED NOT DETECTED Final   Staphylococcus species NOT DETECTED NOT DETECTED Final   Staphylococcus aureus NOT DETECTED NOT DETECTED Final   Streptococcus species NOT DETECTED NOT DETECTED Final   Streptococcus agalactiae NOT DETECTED NOT DETECTED Final   Streptococcus pneumoniae NOT DETECTED NOT DETECTED Final   Streptococcus pyogenes NOT DETECTED NOT DETECTED Final   Acinetobacter baumannii NOT DETECTED NOT DETECTED Final   Enterobacteriaceae species DETECTED (A) NOT DETECTED Final    Comment: CRITICAL RESULT CALLED TO, READ BACK BY AND VERIFIED WITH: MATT MCBANE ON 10/21/15 AT 0005 [place]    Enterobacter cloacae complex NOT DETECTED NOT DETECTED Final   Escherichia coli NOT DETECTED NOT DETECTED Final   Klebsiella oxytoca NOT DETECTED NOT DETECTED Final   Klebsiella pneumoniae NOT DETECTED NOT DETECTED Final   Proteus species (A) NOT DETECTED Final    CRITICAL RESULT CALLED TO, READ BACK BY AND VERIFIED WITH:    Comment: MATT MCBANE ON 10/21/15 AT 0005 [place]   Serratia marcescens NOT DETECTED NOT DETECTED Final   Haemophilus influenzae NOT DETECTED NOT DETECTED Final   Neisseria meningitidis NOT DETECTED NOT DETECTED Final   Pseudomonas aeruginosa NOT DETECTED  NOT DETECTED Final   Candida albicans NOT DETECTED NOT DETECTED Final   Candida glabrata NOT DETECTED NOT DETECTED Final   Candida krusei NOT DETECTED NOT DETECTED Final   Candida parapsilosis NOT DETECTED NOT DETECTED Final   Candida tropicalis NOT DETECTED NOT DETECTED Final   Carbapenem resistance NOT DETECTED NOT DETECTED Final   Methicillin resistance NOT DETECTED NOT DETECTED Final   Vancomycin resistance NOT DETECTED NOT DETECTED Final  MRSA PCR Screening     Status: None   Collection Time: 10/20/15  7:41 PM  Result Value Ref Range Status   MRSA by PCR NEGATIVE NEGATIVE Final    Comment:        The GeneXpert MRSA  Assay (FDA approved for NASAL specimens only), is one component of a comprehensive MRSA colonization surveillance program. It is not intended to diagnose MRSA infection nor to guide or monitor treatment for MRSA infections.   CULTURE, BLOOD (ROUTINE X 2) w Reflex to PCR ID Panel     Status: None   Collection Time: 10/22/15 11:39 AM  Result Value Ref Range Status   Specimen Description BLOOD LEFT HAND  Final   Special Requests BOTTLES DRAWN AEROBIC AND ANAEROBIC 1CC  Final   Culture NO GROWTH 5 DAYS  Final   Report Status 10/27/2015 FINAL  Final  Culture, expectorated sputum-assessment     Status: None   Collection Time: 10/22/15 12:00 PM  Result Value Ref Range Status   Specimen Description SPUTUM  Final   Special Requests Normal  Final   Sputum evaluation THIS SPECIMEN IS ACCEPTABLE FOR SPUTUM CULTURE  Final   Report Status 10/22/2015 FINAL  Final  Culture, respiratory (NON-Expectorated)     Status: None   Collection Time: 10/22/15 12:00 PM  Result Value Ref Range Status   Specimen Description SPUTUM  Final   Special Requests Normal Reflexed from [id number]  Final   Gram Stain   Final    FAIR SPECIMEN - 70-80% WBCS FEW WBC SEEN RARE YEAST RARE GRAM NEGATIVE RODS    Culture LIGHT GROWTH ENTEROBACTER AEROGENES  Final   Report Status 10/24/2015 FINAL  Final    Organism ID, Bacteria ENTEROBACTER AEROGENES  Final      Susceptibility   Enterobacter aerogenes - MIC*    CEFAZOLIN >=64 RESISTANT Resistant     CEFEPIME <=1 SENSITIVE Sensitive     CEFTAZIDIME <=1 SENSITIVE Sensitive     CEFTRIAXONE <=1 SENSITIVE Sensitive     CIPROFLOXACIN <=0.25 SENSITIVE Sensitive     GENTAMICIN <=1 SENSITIVE Sensitive     IMIPENEM 2 SENSITIVE Sensitive     TRIMETH/SULFA <=20 SENSITIVE Sensitive     PIP/TAZO <=4 SENSITIVE Sensitive     * LIGHT GROWTH ENTEROBACTER AEROGENES  Urine culture     Status: None   Collection Time: 10/22/15 12:45 PM  Result Value Ref Range Status   Specimen Description URINE, RANDOM  Final   Special Requests NONE  Final   Culture NO GROWTH 2 DAYS  Final   Report Status 10/24/2015 FINAL  Final  Urine culture     Status: None   Collection Time: 10/28/15  2:36 PM  Result Value Ref Range Status   Specimen Description URINE, RANDOM  Final   Special Requests NONE  Final   Culture NO GROWTH 2 DAYS  Final   Report Status 10/30/2015 FINAL  Final    Coagulation Studies: No results for input(s): LABPROT, INR in the last 72 hours.  Urinalysis: No results for input(s): COLORURINE, LABSPEC, PHURINE, GLUCOSEU, HGBUR, BILIRUBINUR, KETONESUR, PROTEINUR, UROBILINOGEN, NITRITE, LEUKOCYTESUR in the last 72 hours.  Invalid input(s): APPERANCEUR    Imaging: Dg Chest Port 1 View  11/04/2015  CLINICAL DATA:  PICC line insertion. EXAM: PORTABLE CHEST 1 VIEW COMPARISON:  Chest x-ray dated 11/01/2015. FINDINGS: Left-sided central line in place with tip stable in position at the upper margin of the SVC. New right-sided PICC line now in place with tip well-positioned in the lower SVC. Cardiomediastinal silhouette is stable in size and configuration. Central pulmonary vascular congestion and bilateral interstitial edema persists, but decreased compared to the previous exam. Opacities at each lung base most likely representing a combination of small pleural  effusions and atelectasis. IMPRESSION:  1. Right-sided PICC line placement with tip well-positioned in the lower SVC, near the expected level of the cavoatrial junction. 2. Left-sided central line stable in position with tip at the upper margin of the SVC. 3. Persistent central pulmonary vascular congestion and bilateral interstitial edema, but improved compared to the previous exam suggesting improved fluid status. 4. Probable small bilateral pleural effusions and bibasilar atelectasis. Electronically Signed   By: Franki Cabot M.D.   On: 11/04/2015 14:37     Medications:     . antiseptic oral rinse  7 mL Mouth Rinse BID  . cefTRIAXone (ROCEPHIN)  IV  2 g Intravenous Q24H  . enoxaparin (LOVENOX) injection  40 mg Subcutaneous Q12H  . insulin aspart  0-5 Units Subcutaneous QHS  . insulin aspart  0-9 Units Subcutaneous TID WC  . insulin glargine  15 Units Subcutaneous Daily  . ipratropium-albuterol  3 mL Nebulization Q6H   [DISCONTINUED] acetaminophen **OR** acetaminophen, bisacodyl, iohexol, morphine injection, ondansetron (ZOFRAN) IV, prochlorperazine, sodium chloride flush  Assessment/ Plan:  April [name] is a 70 y.o. white female with DM (A1c 7.7%),  who was admitted to [facility] on 10/20/2015  1. Acute renal failure with metabolic acidosis:  - Unknown baseline creatinine. Admit Cr 3.01 Required CRRT from 1/24 to 1/27. Then intermittent hemodialysis 1/28. Acute renal failure from sepsis leading to ATN. Serologic testing negative 10/21/15 - Renal function remains stable, Cr 0.92, this appears to be her baseline.  2. Hypernatremia - from post ATN diuresis - Na fluctuating, up to 146, did well with thin liquids this AM, will restart low dose D5W at 50cc/hr.   3. Hypokalemia: post ATN diuresis.  - given aggressive repletion yesterday, now improved.  4. Sepsis/hypotension secondary to urinary tract infection: proteus - switched to ceftriaxone.  5. Anasarca - will likely be slow to  improve, should improve as she moves more and her nutrition improves.   6. 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  S/p right ureteral stent placement 11/03/15 by Dr. Erlene Quan.  7. Acute resp failure - Extubated February 2, doing well off vent.   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    LOS: [place], Sylis Ketchum 2/9/201711:15 AM

## 2015-11-05 NOTE — Progress Notes (Signed)
Patient ID: April [name], female   DOB: April 17, 1946, 70 y.o.   MRN: [id number] [facility] PROGRESS NOTE  April [name]:[id number] DOB: 01/05/1946 DOA: 10/20/2015 PCP: Albina Billet, MD  HPI/Subjective: Patient answers all yes or no questions. As per friend at the bedside she was telling her details of where to find things in her house. And she thinks her mental status is much better. I asked the patient about her CODE STATUS and she says she doesn't know. Patient never offers any complaints.  Objective: Filed Vitals:   11/05/15 1232 11/05/15 1320  BP: 146/67   Pulse: 88 93  Temp: 98.2 F (36.8 C)   Resp: 20     Filed Weights   11/03/15 0500 11/04/15 0500 11/05/15 0500  Weight: 119.8 kg (264 lb 1.8 oz) 130 kg (286 lb 9.6 oz) 121.519 kg (267 lb 14.4 oz)    ROS: Review of Systems  Constitutional: Negative for fever.  Eyes: Negative for blurred vision.  Respiratory: Negative for cough and shortness of breath.   Cardiovascular: Negative for chest pain.  Gastrointestinal: Negative for nausea, vomiting, abdominal pain, diarrhea and constipation.  Genitourinary: Negative for dysuria.  Musculoskeletal: Negative for joint pain.  Neurological: Negative for dizziness and headaches.     Exam: Physical Exam  HENT:  Nose: No mucosal edema.  Mouth/Throat: No oropharyngeal exudate or posterior oropharyngeal edema.  Eyes: Conjunctivae, EOM and lids are normal. Pupils are equal, round, and reactive to light.  Neck: No JVD present. Carotid bruit is not present. No edema present. No thyroid mass and no thyromegaly present.  Cardiovascular: S1 normal and S2 normal.  Exam reveals no gallop.   No murmur heard. Pulses:      Dorsalis pedis pulses are 2+ on the right side, and 2+ on the left side.  Respiratory: She has decreased breath sounds in the right lower field and the left lower field. She has no wheezes. She has no rhonchi. She has rales in the right lower field and the left  lower field.  GI: Soft. Bowel sounds are normal. There is no tenderness.  Musculoskeletal:       Right ankle: She exhibits swelling.       Left ankle: She exhibits swelling.  Lymphadenopathy:    She has no cervical adenopathy.  Neurological: She is alert.  Patient able to lift her arms up off the bed little bit better.  Skin: Skin is warm. Nails show no clubbing.  Right leg demarcated halfway up the lower leg. Gangrene all the toes and right foot. Left foot demarcated and left toes gangrenous.  Psychiatric: She has a normal mood and affect.      Data Reviewed: Basic Metabolic Panel:  Recent Labs Lab 10/30/15 0431  10/31/15 0649  11/01/15 0455 11/01/15 1300 11/02/15 0509 11/03/15 0539 11/03/15 1245 11/03/15 1715 11/04/15 0533 11/04/15 2247 11/05/15 0500  NA 157*  < >  --   < > 156* 151* 150* 146*  --   --  141  --  146*  K 3.7  < >  --   < > 3.3* 3.4* 3.5 2.8* 3.3* 3.4* 2.9* 3.3* 3.6  CL 121*  < >  --   < > 123* 121* 119* 114*  --   --  111  --  115*  CO2 31  < >  --   < > '30 27 25 29  '$ --   --  24  --  28  GLUCOSE 171*  < >  --   < >  144* 185* 184* 255*  --   --  192*  --  129*  BUN 54*  < >  --   < > 39* 34* 30* 28*  --   --  23*  --  22*  CREATININE 1.33*  < >  --   < > 1.09* 1.12* 0.98 1.06*  --   --  0.98  --  0.92  CALCIUM 8.1*  < >  --   < > 7.6* 7.6* 7.7* 7.5*  --   --  7.4*  --  7.8*  MG 2.1  --  2.0  --  2.0  --   --  1.8  --   --  2.0  --  1.8  PHOS 3.3  --   --   --  3.2  --   --  3.6  --   --  2.8  --   --   < > = values in this interval not displayed. Liver Function Tests:  Recent Labs Lab 11/02/15 0509 11/03/15 0539  AST 20 20  ALT 29 26  ALKPHOS 80 87  BILITOT 1.0 0.7  PROT 5.8* 5.4*  ALBUMIN 1.7* 1.6*    Recent Labs Lab 10/31/15 0649  AMMONIA 18   CBC:  Recent Labs Lab 10/30/15 0431 10/31/15 0500 11/02/15 0509 11/04/15 0533 11/05/15 0500  WBC 29.4* 28.4* 33.5* 18.4* 19.4*  HGB 10.0* 9.9* 9.3* 8.5* 9.3*  HCT 30.8* 30.6* 29.1*  25.7* 28.0*  MCV 94.1 93.7 94.1 91.1 91.3  PLT 181 211 203 187 216   BNP (last 3 results)  Recent Labs  10/31/15 0649  BNP 268.0*     CBG:  Recent Labs Lab 11/04/15 1118 11/04/15 1544 11/04/15 2207 11/05/15 0727 11/05/15 1121  GLUCAP 147* 142* 111* 107* 245*    Recent Results (from the past 240 hour(s))  Urine culture     Status: None   Collection Time: 10/28/15  2:36 PM  Result Value Ref Range Status   Specimen Description URINE, RANDOM  Final   Special Requests NONE  Final   Culture NO GROWTH 2 DAYS  Final   Report Status 10/30/2015 FINAL  Final     Studies: Dg Chest Port 1 View  11/04/2015  CLINICAL DATA:  PICC line insertion. EXAM: PORTABLE CHEST 1 VIEW COMPARISON:  Chest x-ray dated 11/01/2015. FINDINGS: Left-sided central line in place with tip stable in position at the upper margin of the SVC. New right-sided PICC line now in place with tip well-positioned in the lower SVC. Cardiomediastinal silhouette is stable in size and configuration. Central pulmonary vascular congestion and bilateral interstitial edema persists, but decreased compared to the previous exam. Opacities at each lung base most likely representing a combination of small pleural effusions and atelectasis. IMPRESSION: 1. Right-sided PICC line placement with tip well-positioned in the lower SVC, near the expected level of the cavoatrial junction. 2. Left-sided central line stable in position with tip at the upper margin of the SVC. 3. Persistent central pulmonary vascular congestion and bilateral interstitial edema, but improved compared to the previous exam suggesting improved fluid status. 4. Probable small bilateral pleural effusions and bibasilar atelectasis. Electronically Signed   By: Franki Cabot M.D.   On: 11/04/2015 14:37    Scheduled Meds: . antiseptic oral rinse  7 mL Mouth Rinse BID  . cefTRIAXone (ROCEPHIN)  IV  2 g Intravenous Q24H  . enoxaparin (LOVENOX) injection  40 mg Subcutaneous Q12H   . ferrous sulfate  325 mg  Oral BID WC  . insulin aspart  0-5 Units Subcutaneous QHS  . insulin aspart  0-9 Units Subcutaneous TID WC  . insulin glargine  15 Units Subcutaneous Daily  . ipratropium-albuterol  3 mL Nebulization Q6H    Assessment/Plan:  1. Septic shock due to Proteus bacteremia. Sputum culture also positive for Enterobacter pneumonia. Patient is on Rocephin. Patient still has leukocytosis. Patient's temperature curve has gotten better with stent placed in the ureter. 2. Acute renal failure. This has improved likely due to ATN in the setting of sepsis and rhabdomyolysis. Improved to normal range. Dialysis catheter removed. 3. Atrial fibrillation with rapid ventricular response. Heart rate controlled. 4. Type 2 diabetes- patient to be switched to lantus and slinding scale. 5. Elevated liver function tests and jaundice- likely secondary shock liver. Liver function tests have normalized. 6. Acute rhabdomyolysis- CPKs have trended better 7. 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 Platelet count has recovered. 8. Acute respiratory failure- on nasal cannula. 9. Severe hypernatremia- sodium has improved. I will stop IV fluids today. 10. Acute encephalopathy. Patient now answering questions.  11. Gangrene bilateral lower extremities. Dr. do vascular surgery recommended following up in the office in 2-3 weeks to allow the gangrene to demarcate. Likely will need bilateral lower extremity amputations below the knee 12. Right hydronephrosis on previous sonogram. urology placed a stent. 13. Hypomagnesemia and hypokalemia- electrolytes have been being replaced on a daily basis 14. Weakness- we'll get physical therapy evaluation. Spoke with care manager about possible LTAC referral but the patient will need 21 days in the hospital first. 15. Patient on dysphagia diet  Code Status:     Code Status Orders        Start     Ordered   10/30/15 1511  Do not  attempt resuscitation (DNR)   Continuous    Question Answer Comment  In the event of cardiac or respiratory ARREST Do not call a "code blue"   In the event of cardiac or respiratory ARREST Do not perform Intubation, CPR, defibrillation or ACLS   In the event of cardiac or respiratory ARREST Use medication by any route, position, wound care, and other measures to relive pain and suffering. May use oxygen, suction and manual treatment of airway obstruction as needed for comfort.   Comments DNR/ DNI      10/30/15 1510    Code Status History    Date Active Date Inactive Code Status Order ID Comments User Context   10/30/2015  2:18 PM 10/30/2015  3:10 PM DNR [id number]  Colleen Can, MD Inpatient   10/20/2015  5:14 PM 10/30/2015  2:18 PM Full Code [id number]  Fritzi Mandes, MD Inpatient     Disposition Plan: To be determined  Consultants:  Nephrology  Neurology  Critical care  Vascular surgery  Urology  Infectious disease  Antibiotics:  Rocephin  Time spent: 25 minutes. As per patient okay to talk in front of friend who is present at the bedside.   Loletha Grayer  [facility]

## 2015-11-06 LAB — BASIC METABOLIC PANEL
Anion gap: 3 — ABNORMAL LOW (ref 5–15)
BUN: 22 mg/dL — AB (ref 6–20)
CHLORIDE: 115 mmol/L — AB (ref 101–111)
CO2: 26 mmol/L (ref 22–32)
CREATININE: 0.93 mg/dL (ref 0.44–1.00)
Calcium: 7.9 mg/dL — ABNORMAL LOW (ref 8.9–10.3)
GFR calc Af Amer: >60 mL/min (ref >60)
GFR calc non Af Amer: >60 mL/min (ref >60)
Glucose, Bld: 144 mg/dL — ABNORMAL HIGH (ref 65–99)
Potassium: 3.3 mmol/L — ABNORMAL LOW (ref 3.5–5.1)
Sodium: 144 mmol/L (ref 135–145)

## 2015-11-06 LAB — CBC
HCT: 27.9 % — ABNORMAL LOW (ref 35.0–47.0)
Hemoglobin: 9.1 g/dL — ABNORMAL LOW (ref 12.0–16.0)
MCH: 30 pg (ref 26.0–34.0)
MCHC: 32.6 g/dL (ref 32.0–36.0)
MCV: 91.8 fL (ref 80.0–100.0)
Platelets: 211 10*3/uL (ref 150–440)
RBC: 3.04 MIL/uL — ABNORMAL LOW (ref 3.80–5.20)
RDW: 14.4 % (ref 11.5–14.5)
WBC: 16.6 10*3/uL — ABNORMAL HIGH (ref 3.6–11.0)

## 2015-11-06 LAB — GLUCOSE, CAPILLARY
GLUCOSE-CAPILLARY: 184 mg/dL — AB (ref 65–99)
Glucose-Capillary: 130 mg/dL — ABNORMAL HIGH (ref 65–99)
Glucose-Capillary: 177 mg/dL — ABNORMAL HIGH (ref 65–99)
Glucose-Capillary: 165 mg/dL — ABNORMAL HIGH (ref 65–99)

## 2015-11-06 MED ORDER — POTASSIUM CHLORIDE 20 MEQ PO PACK
40.0000 meq | PACK | Freq: Once | ORAL | Status: AC
  Administered 2015-11-06: 40 meq via ORAL
  Filled 2015-11-06: qty 2

## 2015-11-06 NOTE — Care Management (Signed)
Called and confirmed insurance covered for SNF through patients [facility] plan.  Per [facility] rep patient does have coverage for SNF placement.  Days 0-21 are covered at 100%.  Days 21-100 will have a co pay $160 per day, with a max out of pocket of $5,900. Once the max out of pocket is met coverage will be 100%. Information relayed to CSW

## 2015-11-06 NOTE — Clinical Social Work Note (Signed)
Patient has had two bed offers. They have been extended to patient's cousin: April [name] and he has chosen [facility]. [facility] notified and they are beginning prior auth. Shela Leff MSW,LCSW [phone]

## 2015-11-06 NOTE — Progress Notes (Signed)
Changed some of the bilateral lower extremity foam dressings per order stating, change every 3 days. Some drsgs left in place due to patient's blisters peeling off with drsg removal - RN discerns better to leave some of the drsgs in place. Pt states she did not want all drsgs changed.

## 2015-11-06 NOTE — Progress Notes (Signed)
Pt noted to have bleeding around foley catheter prior to BM. Pt's BM was brown with no blood noted. MD acknowledged. Order to d/c foley.

## 2015-11-06 NOTE — Progress Notes (Signed)
Nutrition Follow-up     INTERVENTION:  Meals and snacks: noted SLP upgraded diet today to dysphagia 2.  Cater to pt preferences Medical Nutrition Supplement: Continue magic cup and mightyshake for added nutrition   NUTRITION DIAGNOSIS:   Inadequate oral intake related to acute illness as evidenced by NPO status, improving as on solid food diet    GOAL:   Provide needs based on ASPEN/SCCM guidelines    MONITOR:    (Energy Intake, Pulmonary, Digestive System, Electrolyte/Renal Profile, Glucose Profile)  REASON FOR ASSESSMENT:   Consult Enteral/tube feeding initiation and management  ASSESSMENT:      Current Nutrition: bites of egg salad and chicken salad today for lunch.  Took few sips of supplements and bite or two of magic cup.    Gastrointestinal Profile: Last BM: 2/9   Scheduled Medications:  . antiseptic oral rinse  7 mL Mouth Rinse BID  . cefTRIAXone (ROCEPHIN)  IV  2 g Intravenous Q24H  . enoxaparin (LOVENOX) injection  40 mg Subcutaneous Q12H  . ferrous sulfate  325 mg Oral BID WC  . insulin aspart  0-5 Units Subcutaneous QHS  . insulin aspart  0-9 Units Subcutaneous TID WC  . insulin glargine  15 Units Subcutaneous Daily  . ipratropium-albuterol  3 mL Nebulization Q6H       Electrolyte/Renal Profile and Glucose Profile:   Recent Labs Lab 11/01/15 0455  11/03/15 0539  11/04/15 0533 11/04/15 2247 11/05/15 0500 11/06/15 0529  NA 156*  < > 146*  --  141  --  146* 144  K 3.3*  < > 2.8*  < > 2.9* 3.3* 3.6 3.3*  CL 123*  < > 114*  --  111  --  115* 115*  CO2 30  < > 29  --  24  --  28 26  BUN 39*  < > 28*  --  23*  --  22* 22*  CREATININE 1.09*  < > 1.06*  --  0.98  --  0.92 0.93  CALCIUM 7.6*  < > 7.5*  --  7.4*  --  7.8* 7.9*  MG 2.0  --  1.8  --  2.0  --  1.8  --   PHOS 3.2  --  3.6  --  2.8  --   --   --   GLUCOSE 144*  < > 255*  --  192*  --  129* 144*  < > = values in this interval not displayed. Protein Profile:  Recent Labs Lab  11/02/15 0509 11/03/15 0539  ALBUMIN 1.7* 1.6*      Weight Trend since Admission: Filed Weights   11/04/15 0500 11/05/15 0500 11/06/15 0500  Weight: 286 lb 9.6 oz (130 kg) 267 lb 14.4 oz (121.519 kg) 269 lb 1.6 oz (122.063 kg)       Diet Order:  DIET DYS 2 Room service appropriate?: Yes with Assist; Fluid consistency:: Thin  Skin:  Reviewed, no issues   Height:   Ht Readings from Last 1 Encounters:  11/02/15 '5\' 7"'$  (1.702 m)    Weight:   Wt Readings from Last 1 Encounters:  11/06/15 269 lb 1.6 oz (122.063 kg)    Ideal Body Weight:     BMI:  Body mass index is 42.14 kg/(m^2).  Estimated Nutritional Needs:   Kcal:  BEE 1167 kcals (IF 1.1-1.2, AF 1.2) 1540-1680 kcals/d. (Using IBW of 61kg)  Protein:  (1.0-1.2 g/kg) 61-73 g/d  Fluid:  1525-1830 (25-20m/kg)   EDUCATION NEEDS:  No education needs identified at this time  [place]. Zenia Resides, [place], [place] (pager) Weekend/On-Call pager [phone])

## 2015-11-06 NOTE — Progress Notes (Signed)
Pharmacy Antibiotic Note  April [name] is a 70 y.o. female admitted on 10/20/2015 with enterobacter bacteremia.  Pharmacy has been consulted for ceftriaxone dosing.  Plan: This is currently day #18 of antibiotics. Patient has been on cefepime 2 g IV q 8 hours. ID is following and patient is being switched to ceftriaxone today based on sensitivities.   Ceftriaxone 2 g IV daily  Height: '5\' 7"'$  (170.2 cm) Weight: 269 lb 1.6 oz (122.063 kg) IBW/kg (Calculated) : 61.6  Temp (24hrs), Avg:98.1 F (36.7 C), Min:98 F (36.7 C), Max:98.2 F (36.8 C)   Recent Labs Lab 10/31/15 0500  11/02/15 0509 11/03/15 0539 11/04/15 0533 11/05/15 0500 11/06/15 0529  WBC 28.4*  --  33.5*  --  18.4* 19.4* 16.6*  CREATININE 1.25*  < > 0.98 1.06* 0.98 0.92 0.93  < > = values in this interval not displayed.  Estimated Creatinine Clearance: 77.3 mL/min (by C-G formula based on Cr of 0.93).    Allergies  Allergen Reactions  . Prednisone Anaphylaxis   Antimicrobials this admission: ceftriaxone 2/8 >>  cefepime 2/1 >> 2/8 Ceftriaxone 1/30 >> 1/31 Meropenem 1/27 >> 1/29  Microbiology results: 1/26 BCx: enterobacter aerogenes 1/24 BCx: proteus 2/1 UCx: no growth 2 days  1/24 UCx: proteus 1/24  MRSA PCR: negative  Thank you for allowing pharmacy to be a part of this patient's care.  Larene Beach, PharmD Clinical Pharmacist 11/06/2015 9:24 AM

## 2015-11-06 NOTE — Progress Notes (Signed)
[facility]  ROUNDING NOTE   Subjective:  Patient slowly improving. Sodium down to 144. Still has some thirst. Renal function has stabilized. Excellent urine output of 2.9 L.  Objective:  Vital signs in last 24 hours:  Temp:  [97.9 F (36.6 C)-98.2 F (36.8 C)] 97.9 F (36.6 C) (02/10 1301) Pulse Rate:  [86-95] 93 (02/10 1301) Resp:  [20-26] 21 (02/10 1301) BP: (151-156)/(51-61) 156/61 mmHg (02/10 1301) SpO2:  [92 %-95 %] 95 % (02/10 1301) Weight:  [122.063 kg (269 lb 1.6 oz)] 122.063 kg (269 lb 1.6 oz) (02/10 0500)  Weight change: 0.544 kg (1 lb 3.2 oz) Filed Weights   11/04/15 0500 11/05/15 0500 11/06/15 0500  Weight: 130 kg (286 lb 9.6 oz) 121.519 kg (267 lb 14.4 oz) 122.063 kg (269 lb 1.6 oz)    Intake/Output: I/O last 3 completed shifts: In: 945 [P.O.:480; Other:210; IV Piggyback:255] Out: 2900 [Urine:2900]   Intake/Output this shift:  Total I/O In: -  Out: 750 [Urine:750]  Physical Exam: General: No acute distress  Head: [place]/AT hard of hearing OM dry  Eyes: Eyes open   Neck: supple  Lungs:  Scattered rhonchi, normal effort  Heart: Regular, no rub   Abdomen:  Soft, nontender, obese  Extremities: 3+ peripheral edema. +anasarca  Neurologic:  alert, able to follow simple commands   Skin: Feet in soft support, wounds noted b/l LE's       Basic Metabolic Panel:  Recent Labs Lab 10/31/15 0649  11/01/15 0455  11/02/15 0509 11/03/15 0539  11/03/15 1715 11/04/15 0533 11/04/15 2247 11/05/15 0500 11/06/15 0529  NA  --   < > 156*  < > 150* 146*  --   --  141  --  146* 144  K  --   < > 3.3*  < > 3.5 2.8*  < > 3.4* 2.9* 3.3* 3.6 3.3*  CL  --   < > 123*  < > 119* 114*  --   --  111  --  115* 115*  CO2  --   < > 30  < > 25 29  --   --  24  --  28 26  GLUCOSE  --   < > 144*  < > 184* 255*  --   --  192*  --  129* 144*  BUN  --   < > 39*  < > 30* 28*  --   --  23*  --  22* 22*  CREATININE  --   < > 1.09*  < > 0.98 1.06*  --   --  0.98  --  0.92  0.93  CALCIUM  --   < > 7.6*  < > 7.7* 7.5*  --   --  7.4*  --  7.8* 7.9*  MG 2.0  --  2.0  --   --  1.8  --   --  2.0  --  1.8  --   PHOS  --   --  3.2  --   --  3.6  --   --  2.8  --   --   --   < > = values in this interval not displayed.  Liver Function Tests:  Recent Labs Lab 11/02/15 0509 11/03/15 0539  AST 20 20  ALT 29 26  ALKPHOS 80 87  BILITOT 1.0 0.7  PROT 5.8* 5.4*  ALBUMIN 1.7* 1.6*   No results for input(s): LIPASE, AMYLASE in the last 168 hours.  Recent Labs  Lab 10/31/15 0649  AMMONIA 18    CBC:  Recent Labs Lab 10/31/15 0500 11/02/15 0509 11/04/15 0533 11/05/15 0500 11/06/15 0529  WBC 28.4* 33.5* 18.4* 19.4* 16.6*  HGB 9.9* 9.3* 8.5* 9.3* 9.1*  HCT 30.6* 29.1* 25.7* 28.0* 27.9*  MCV 93.7 94.1 91.1 91.3 91.8  PLT 211 203 187 216 211    Cardiac Enzymes: No results for input(s): CKTOTAL, CKMB, CKMBINDEX, TROPONINI in the last 168 hours.  BNP: Invalid input(s): POCBNP  CBG:  Recent Labs Lab 11/05/15 1121 11/05/15 1652 11/05/15 2111 11/06/15 0731 11/06/15 1125  GLUCAP 245* 163* 163* 130* 177*    Microbiology: Results for orders placed or performed during the hospital encounter of 10/20/15  Urine culture     Status: None   Collection Time: 10/20/15  9:57 AM  Result Value Ref Range Status   Specimen Description URINE, RANDOM  Final   Special Requests NONE  Final   Culture >=100,000 COLONIES/mL PROTEUS MIRABILIS  Final   Report Status 10/22/2015 FINAL  Final   Organism ID, Bacteria PROTEUS MIRABILIS  Final      Susceptibility   Proteus mirabilis - MIC*    AMPICILLIN <=2 SENSITIVE Sensitive     GENTAMICIN <=1 SENSITIVE Sensitive     CEFTRIAXONE Value in next row Sensitive      SENSITIVE<=1    CIPROFLOXACIN Value in next row Sensitive      SENSITIVE<=0.25    IMIPENEM Value in next row Sensitive      SENSITIVE2    NITROFURANTOIN Value in next row Resistant      RESISTANT128    TRIMETH/SULFA Value in next row Sensitive       SENSITIVE<=20    PIP/TAZO Value in next row Sensitive      SENSITIVE<=4    AMPICILLIN/SULBACTAM Value in next row Sensitive      SENSITIVE<=2    * >=100,000 COLONIES/mL PROTEUS MIRABILIS  Culture, blood (routine x 2)     Status: None   Collection Time: 10/20/15  9:57 AM  Result Value Ref Range Status   Specimen Description BLOOD RIGHT WRIST  Final   Special Requests   Final    BOTTLES DRAWN AEROBIC AND ANAEROBIC ANA 1ML AER 4ML   Culture  Setup Time   Final    GRAM NEGATIVE RODS IN BOTH AEROBIC AND ANAEROBIC BOTTLES CRITICAL RESULT CALLED TO, READ BACK BY AND VERIFIED WITH: MATT MCBANE ON 10/21/15 AT 0005 [facility] CONFIRMED BY [place]    Culture   Final    PROTEUS MIRABILIS IN BOTH AEROBIC AND ANAEROBIC BOTTLES    Report Status 10/22/2015 FINAL  Final   Organism ID, Bacteria PROTEUS MIRABILIS  Final      Susceptibility   Proteus mirabilis - MIC*    AMPICILLIN/SULBACTAM Value in next row Sensitive      SENSITIVE<=2    PIP/TAZO Value in next row Sensitive      SENSITIVE<=4    CEFTRIAXONE Value in next row Sensitive      SENSITIVE<=1    IMIPENEM Value in next row Sensitive      SENSITIVE2    GENTAMICIN Value in next row Sensitive      SENSITIVE<=1    CIPROFLOXACIN Value in next row Sensitive      SENSITIVE<=0.25    * PROTEUS MIRABILIS  Culture, blood (routine x 2)     Status: None   Collection Time: 10/20/15  9:57 AM  Result Value Ref Range Status   Specimen Description BLOOD LEFT HAND  Final   Special Requests   Final    BOTTLES DRAWN AEROBIC AND ANAEROBIC AER 3ML ANA 2ML   Culture  Setup Time   Final    GRAM NEGATIVE RODS IN BOTH AEROBIC AND ANAEROBIC BOTTLES CRITICAL VALUE NOTED.  VALUE IS CONSISTENT WITH PREVIOUSLY REPORTED AND CALLED VALUE.    Culture   Final    PROTEUS MIRABILIS IN BOTH AEROBIC AND ANAEROBIC BOTTLES    Report Status 10/22/2015 FINAL  Final   Organism ID, Bacteria PROTEUS MIRABILIS  Final      Susceptibility   Proteus mirabilis - MIC*    AMPICILLIN  Value in next row Sensitive      SENSITIVE<=2    PIP/TAZO Value in next row Sensitive      SENSITIVE<=4    CEFTAZIDIME Value in next row Sensitive      SENSITIVE<=1    CEFTRIAXONE Value in next row Sensitive      SENSITIVE<=1    CEFEPIME Value in next row Sensitive      SENSITIVE<=1    IMIPENEM Value in next row Sensitive      SENSITIVE2    GENTAMICIN Value in next row Sensitive      SENSITIVE<=1    CIPROFLOXACIN Value in next row Sensitive      SENSITIVE<=0.25    * PROTEUS MIRABILIS  Blood Culture ID Panel (Reflexed)     Status: Abnormal   Collection Time: 10/20/15  9:57 AM  Result Value Ref Range Status   Enterococcus species NOT DETECTED NOT DETECTED Final   Listeria monocytogenes NOT DETECTED NOT DETECTED Final   Staphylococcus species NOT DETECTED NOT DETECTED Final   Staphylococcus aureus NOT DETECTED NOT DETECTED Final   Streptococcus species NOT DETECTED NOT DETECTED Final   Streptococcus agalactiae NOT DETECTED NOT DETECTED Final   Streptococcus pneumoniae NOT DETECTED NOT DETECTED Final   Streptococcus pyogenes NOT DETECTED NOT DETECTED Final   Acinetobacter baumannii NOT DETECTED NOT DETECTED Final   Enterobacteriaceae species DETECTED (A) NOT DETECTED Final    Comment: CRITICAL RESULT CALLED TO, READ BACK BY AND VERIFIED WITH: MATT MCBANE ON 10/21/15 AT 0005 [place]    Enterobacter cloacae complex NOT DETECTED NOT DETECTED Final   Escherichia coli NOT DETECTED NOT DETECTED Final   Klebsiella oxytoca NOT DETECTED NOT DETECTED Final   Klebsiella pneumoniae NOT DETECTED NOT DETECTED Final   Proteus species (A) NOT DETECTED Final    CRITICAL RESULT CALLED TO, READ BACK BY AND VERIFIED WITH:    Comment: MATT MCBANE ON 10/21/15 AT 0005 [place]   Serratia marcescens NOT DETECTED NOT DETECTED Final   Haemophilus influenzae NOT DETECTED NOT DETECTED Final   Neisseria meningitidis NOT DETECTED NOT DETECTED Final   Pseudomonas aeruginosa NOT DETECTED NOT DETECTED Final   Candida  albicans NOT DETECTED NOT DETECTED Final   Candida glabrata NOT DETECTED NOT DETECTED Final   Candida krusei NOT DETECTED NOT DETECTED Final   Candida parapsilosis NOT DETECTED NOT DETECTED Final   Candida tropicalis NOT DETECTED NOT DETECTED Final   Carbapenem resistance NOT DETECTED NOT DETECTED Final   Methicillin resistance NOT DETECTED NOT DETECTED Final   Vancomycin resistance NOT DETECTED NOT DETECTED Final  MRSA PCR Screening     Status: None   Collection Time: 10/20/15  7:41 PM  Result Value Ref Range Status   MRSA by PCR NEGATIVE NEGATIVE Final    Comment:        The GeneXpert MRSA Assay (FDA approved for NASAL specimens only), is one  component of a comprehensive MRSA colonization surveillance program. It is not intended to diagnose MRSA infection nor to guide or monitor treatment for MRSA infections.   CULTURE, BLOOD (ROUTINE X 2) w Reflex to PCR ID Panel     Status: None   Collection Time: 10/22/15 11:39 AM  Result Value Ref Range Status   Specimen Description BLOOD LEFT HAND  Final   Special Requests BOTTLES DRAWN AEROBIC AND ANAEROBIC 1CC  Final   Culture NO GROWTH 5 DAYS  Final   Report Status 10/27/2015 FINAL  Final  Culture, expectorated sputum-assessment     Status: None   Collection Time: 10/22/15 12:00 PM  Result Value Ref Range Status   Specimen Description SPUTUM  Final   Special Requests Normal  Final   Sputum evaluation THIS SPECIMEN IS ACCEPTABLE FOR SPUTUM CULTURE  Final   Report Status 10/22/2015 FINAL  Final  Culture, respiratory (NON-Expectorated)     Status: None   Collection Time: 10/22/15 12:00 PM  Result Value Ref Range Status   Specimen Description SPUTUM  Final   Special Requests Normal Reflexed from [id number]  Final   Gram Stain   Final    FAIR SPECIMEN - 70-80% WBCS FEW WBC SEEN RARE YEAST RARE GRAM NEGATIVE RODS    Culture LIGHT GROWTH ENTEROBACTER AEROGENES  Final   Report Status 10/24/2015 FINAL  Final   Organism ID, Bacteria  ENTEROBACTER AEROGENES  Final      Susceptibility   Enterobacter aerogenes - MIC*    CEFAZOLIN >=64 RESISTANT Resistant     CEFEPIME <=1 SENSITIVE Sensitive     CEFTAZIDIME <=1 SENSITIVE Sensitive     CEFTRIAXONE <=1 SENSITIVE Sensitive     CIPROFLOXACIN <=0.25 SENSITIVE Sensitive     GENTAMICIN <=1 SENSITIVE Sensitive     IMIPENEM 2 SENSITIVE Sensitive     TRIMETH/SULFA <=20 SENSITIVE Sensitive     PIP/TAZO <=4 SENSITIVE Sensitive     * LIGHT GROWTH ENTEROBACTER AEROGENES  Urine culture     Status: None   Collection Time: 10/22/15 12:45 PM  Result Value Ref Range Status   Specimen Description URINE, RANDOM  Final   Special Requests NONE  Final   Culture NO GROWTH 2 DAYS  Final   Report Status 10/24/2015 FINAL  Final  Urine culture     Status: None   Collection Time: 10/28/15  2:36 PM  Result Value Ref Range Status   Specimen Description URINE, RANDOM  Final   Special Requests NONE  Final   Culture NO GROWTH 2 DAYS  Final   Report Status 10/30/2015 FINAL  Final    Coagulation Studies: No results for input(s): LABPROT, INR in the last 72 hours.  Urinalysis: No results for input(s): COLORURINE, LABSPEC, PHURINE, GLUCOSEU, HGBUR, BILIRUBINUR, KETONESUR, PROTEINUR, UROBILINOGEN, NITRITE, LEUKOCYTESUR in the last 72 hours.  Invalid input(s): APPERANCEUR    Imaging: No results found.   Medications:     . antiseptic oral rinse  7 mL Mouth Rinse BID  . enoxaparin (LOVENOX) injection  40 mg Subcutaneous Q12H  . ferrous sulfate  325 mg Oral BID WC  . insulin aspart  0-5 Units Subcutaneous QHS  . insulin aspart  0-9 Units Subcutaneous TID WC  . insulin glargine  15 Units Subcutaneous Daily  . ipratropium-albuterol  3 mL Nebulization Q6H   [DISCONTINUED] acetaminophen **OR** acetaminophen, bisacodyl, iohexol, morphine injection, ondansetron (ZOFRAN) IV, prochlorperazine, sodium chloride flush  Assessment/ Plan:  [name] is a 70 y.o. white female with  DM (A1c  7.7%),  who was admitted to [facility] on 10/20/2015  1. Acute renal failure with metabolic acidosis:  - Unknown baseline creatinine. Admit Cr 3.01 Required CRRT from 1/24 to 1/27. Then intermittent hemodialysis 1/28. Acute renal failure from sepsis leading to ATN. Serologic testing negative 10/21/15 - It appears renal function is stabilized. Creatinine currently 0.9. Avoid nephrotoxins as possible.  2. Hypernatremia - from post ATN diuresis - Sodium down to 144.  Now off of D5 W.  3. Hypokalemia: post ATN diuresis.  - Potassium 3.3 today. Patient received potassium chloride today.  4. Sepsis/hypotension secondary to urinary tract infection: proteus - Patient has completed antibiotic therapy.  5. Anasarca - Still with considerable edema. As urine output improves her edema should improve as well..   6. 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  S/p right ureteral stent placement 11/03/15 by Dr. Erlene Quan.  7. Acute resp failure - Extubated February 2, doing well off vent.   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    LOS: 17 April [name] 2/10/20173:30 PM

## 2015-11-06 NOTE — Progress Notes (Signed)
Palliative Care Update  I have seen pt early on during her hospital stay.  My reason for keeping her on the consult list is that we should talk to her about her own wishes about code status at some point. She is improving, but it still may be a bit early to ask her about this matter.    I will see her early next week and explore the appropriateness of timing of this type of conversation.  See my earlier notes for further details. Apparently, she may go to [facility] for rehab.    Colleen Can, MD

## 2015-11-06 NOTE — Progress Notes (Signed)
[place] at [place] NAME: April [name]    MR#:  [id number]  DATE OF BIRTH:  [birth year]  SUBJECTIVE:   No acute events overnight. Afebrile, hemodynamically stable. Follows commands and taking PO well.    REVIEW OF SYSTEMS:   Review of Systems  Constitutional: Negative for fever and chills.  HENT: Negative for congestion and tinnitus.   Eyes: Negative for blurred vision and double vision.  Respiratory: Negative for cough, shortness of breath and wheezing.   Cardiovascular: Negative for chest pain, orthopnea and PND.  Gastrointestinal: Negative for nausea, vomiting, abdominal pain and diarrhea.  Genitourinary: Negative for dysuria and hematuria.  Neurological: Negative for dizziness, sensory change and focal weakness.  All other systems reviewed and are negative.   Tolerating Diet: Dysphagia 2  Tolerating PT: eval noted.   DRUG ALLERGIES:   Allergies  Allergen Reactions  . Prednisone Anaphylaxis    VITALS:  Blood pressure 156/61, pulse 93, temperature 97.9 F (36.6 C), temperature source Oral, resp. rate 21, height '5\' 7"'$  (1.702 m), weight 122.063 kg (269 lb 1.6 oz), SpO2 95 %.  PHYSICAL EXAMINATION:   Physical Exam   GENERAL:  70 y.o.-year-old patient lying in the bed in NAD and follows commands and answers questions appropriately.  EYES: Pupils equal, round, reactive to light, accomodation. (-) scleral icterus.  HEENT: Head atraumatic, normocephalic. Moist Oral Mucosa NECK:  Supple, no jugular venous distention. No thyroid enlargement, no tenderness.  LUNGS: Good air entry bilaterally, no rales or rhonchi or wheezes. He is accessory muscles. CARDIOVASCULAR: S1, S2 normal. No murmurs, rubs, or gallops.  ABDOMEN: Soft, nontender, nondistended. Bowel sounds present. No organomegaly or mass. EXTREMITIES: Gangrenous toes bilaterally, +1 edema bilaterally.  NEUROLOGIC: Globally weak, follows simple commands.  PSYCHIATRIC:  Alert and awake oriented 3, good affect  SKIN: Skin blisters noted on the lower extremities bilaterally, cyanotic appearing and gangrenous toes bilaterally.   LABORATORY PANEL:  CBC  Recent Labs Lab 11/06/15 0529  WBC 16.6*  HGB 9.1*  HCT 27.9*  PLT 211    Chemistries   Recent Labs Lab 11/03/15 0539  11/05/15 0500 11/06/15 0529  NA 146*  < > 146* 144  K 2.8*  < > 3.6 3.3*  CL 114*  < > 115* 115*  CO2 29  < > 28 26  GLUCOSE 255*  < > 129* 144*  BUN 28*  < > 22* 22*  CREATININE 1.06*  < > 0.92 0.93  CALCIUM 7.5*  < > 7.8* 7.9*  MG 1.8  < > 1.8  --   AST 20  --   --   --   ALT 26  --   --   --   ALKPHOS 87  --   --   --   BILITOT 0.7  --   --   --   < > = values in this interval not displayed. Cardiac Enzymes No results for input(s): TROPONINI in the last 168 hours. RADIOLOGY:  No results found. ASSESSMENT AND PLAN:   70 year old female presented to the emergency room after she was found lying on the floor with minimal responsiveness.  1. Severe Septic shock, hypovolemic shock: due to proteus bacteremia, from UTI - finished 18 days of IV abx therapy and appreciate ID input and no need for further abx presently.  - afebrile, hemodynamically stable.    2. Acute renal failure: - is due to ATN in the setting of sepsis and rhabdomyolysis.  -  was on CRRT but it was stopped due to thrombocytopenia.  Had short course of HD 1/28 - Urine output is good and Cr. Back to baseline.   - pt. Was noted to have RIGHT hydronephrosis question UPJ obstruction on CT Scan abd/pelvis on admission and repeat CT abd/pelvis 1/31 showing the same. S/p ureteral stent placement by Dr. Erlene Quan on 11/03/15    3. Severe leukocytosis: Likely due to underlying sepsis and improving.   4. Atrial fibrillation with RVR - rate controlled now.  Off amio now.  - Echo showing normal LV function w/ EF of 55%.    5. DM-2 , new onset - A17 was 7.7.  Cont. Lantus, SSI and follow BS which are stable so far.     6. Acute rhabdomyolysis secondary to patient being found down on the floor:  - CPK's have trended down. Resolved.   7. 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 Was + but improving.  - plt count normal now. No acute bleeding. .   8. Hypokalemia -  Resolved w/ supplementation.   9. Hypernatremia - sodium normalized now and pt. Is off D5W  10.  Gangrenous Toes b/l - this is due to severe hypotension and septic shock from her admission. -Appreciate vascular surgery input. Patient likely will need bilateral transmetatarsal versus bilateral below the knee amputations. Await for further demarcation and this is to be done as an outpatient down the road. -We'll likely need to be an empiric antibiotics and discussed with vascular surgery and will discharge on Augmentin.   CODE STATUS: Full  DVT Prophylaxis: Lovenox  TOTAL critical TIME TAKING CARE OF THIS PATIENT: 30 minutes.   Note: This dictation was prepared with Dragon dictation along with smaller phrase technology. Any transcriptional errors that result from this process are unintentional.  Henreitta Leber M.D on 11/06/2015 at 3:56 PM  Between 7am to 6pm - Pager - [phone]  After 6pm go to [email or web address] - password [email or web address]  Office  [phone]  CC: Primary care physician; Albina Billet, MD

## 2015-11-06 NOTE — Progress Notes (Signed)
[facility] INFECTIOUS DISEASE PROGRESS NOTE Date of Admission:  10/20/2015     ID: TRISHIA CUTHRELL is a 70 y.o. female with urosepsis  Active Problems:   Sepsis ([place])   Acute renal failure (HCC)   Altered mental status   NSTEMI (non-ST elevated myocardial infarction) ([place])   Sepsis due to urinary tract infection ([place])   Acute renal failure (ARF) ([place])   Hydronephrosis   Endotracheally intubated   Respiratory distress   Arterial hypotension   Respiratory failure (HCC)   Proteus septicemia (HCC)   Subjective: Out of unit. No fevers.   ROS  Eleven systems are reviewed and negative except per hpi  Medications:  Antibiotics Given (last 72 hours)    Date/Time Action Medication Dose Rate   11/03/15 2222 Given   ceFEPIme (MAXIPIME) 2 g in dextrose 5 % 50 mL IVPB 2 g 100 mL/hr   11/04/15 0512 Given   ceFEPIme (MAXIPIME) 2 g in dextrose 5 % 50 mL IVPB 2 g 100 mL/hr   11/04/15 1400 Given   ceFEPIme (MAXIPIME) 2 g in dextrose 5 % 50 mL IVPB 2 g 100 mL/hr   11/04/15 1858 Given   cefTRIAXone (ROCEPHIN) 2 g in dextrose 5 % 50 mL IVPB 2 g 100 mL/hr   11/05/15 1742 Given   cefTRIAXone (ROCEPHIN) 2 g in dextrose 5 % 50 mL IVPB 2 g 100 mL/hr     . antiseptic oral rinse  7 mL Mouth Rinse BID  . cefTRIAXone (ROCEPHIN)  IV  2 g Intravenous Q24H  . enoxaparin (LOVENOX) injection  40 mg Subcutaneous Q12H  . ferrous sulfate  325 mg Oral BID WC  . insulin aspart  0-5 Units Subcutaneous QHS  . insulin aspart  0-9 Units Subcutaneous TID WC  . insulin glargine  15 Units Subcutaneous Daily  . ipratropium-albuterol  3 mL Nebulization Q6H    Objective: Vital signs in last 24 hours: Temp:  [97.9 F (36.6 C)-98.2 F (36.8 C)] 97.9 F (36.6 C) (02/10 1301) Pulse Rate:  [86-95] 93 (02/10 1301) Resp:  [20-26] 21 (02/10 1301) BP: (151-156)/(51-61) 156/61 mmHg (02/10 1301) SpO2:  [92 %-95 %] 95 % (02/10 1301) Weight:  [122.063 kg (269 lb 1.6 oz)] 122.063 kg (269 lb 1.6 oz) (02/10  0500) Constitutional: oriented to person, place, and time. Obese, slowed mentationHENT: [place]/AT, PERRLA, no scleral icterus Mouth/Throat: Oropharynx is clear and dry . No oropharyngeal exudate.  Cardiovascular: Normal rate, regular rhythm and normal heart sounds.  No murmur heard.  Pulmonary/Chest:bil rhonhci  Neck = supple, no nuchal rigidity Abdominal: Soft. Bowel sounds are normal. exhibits no distension. There is no tenderness.  Lymphadenopathy: no cervical adenopathy. No axillary adenopathy Neurological: alert and oriented to person, place, and time.  Skin: gangrene bil toes  Psychiatric: a normal mood and affect. behavior is normal.   Lab Results  Recent Labs  11/05/15 0500 11/06/15 0529  WBC 19.4* 16.6*  HGB 9.3* 9.1*  HCT 28.0* 27.9*  NA 146* 144  K 3.6 3.3*  CL 115* 115*  CO2 28 26  BUN 22* 22*  CREATININE 0.92 0.93    Microbiology: Results for orders placed or performed during the hospital encounter of 10/20/15  Urine culture     Status: None   Collection Time: 10/20/15  9:57 AM  Result Value Ref Range Status   Specimen Description URINE, RANDOM  Final   Special Requests NONE  Final   Culture >=100,000 COLONIES/mL PROTEUS MIRABILIS  Final   Report Status  10/22/2015 FINAL  Final   Organism ID, Bacteria PROTEUS MIRABILIS  Final      Susceptibility   Proteus mirabilis - MIC*    AMPICILLIN <=2 SENSITIVE Sensitive     GENTAMICIN <=1 SENSITIVE Sensitive     CEFTRIAXONE Value in next row Sensitive      SENSITIVE<=1    CIPROFLOXACIN Value in next row Sensitive      SENSITIVE<=0.25    IMIPENEM Value in next row Sensitive      SENSITIVE2    NITROFURANTOIN Value in next row Resistant      RESISTANT128    TRIMETH/SULFA Value in next row Sensitive      SENSITIVE<=20    PIP/TAZO Value in next row Sensitive      SENSITIVE<=4    AMPICILLIN/SULBACTAM Value in next row Sensitive      SENSITIVE<=2    * >=100,000 COLONIES/mL PROTEUS MIRABILIS  Culture, blood  (routine x 2)     Status: None   Collection Time: 10/20/15  9:57 AM  Result Value Ref Range Status   Specimen Description BLOOD RIGHT WRIST  Final   Special Requests   Final    BOTTLES DRAWN AEROBIC AND ANAEROBIC ANA 1ML AER 4ML   Culture  Setup Time   Final    GRAM NEGATIVE RODS IN BOTH AEROBIC AND ANAEROBIC BOTTLES CRITICAL RESULT CALLED TO, READ BACK BY AND VERIFIED WITH: MATT MCBANE ON 10/21/15 AT 0005 [facility] CONFIRMED BY [facility]    Culture   Final    PROTEUS MIRABILIS IN BOTH AEROBIC AND ANAEROBIC BOTTLES    Report Status 10/22/2015 FINAL  Final   Organism ID, Bacteria PROTEUS MIRABILIS  Final      Susceptibility   Proteus mirabilis - MIC*    AMPICILLIN/SULBACTAM Value in next row Sensitive      SENSITIVE<=2    PIP/TAZO Value in next row Sensitive      SENSITIVE<=4    CEFTRIAXONE Value in next row Sensitive      SENSITIVE<=1    IMIPENEM Value in next row Sensitive      SENSITIVE2    GENTAMICIN Value in next row Sensitive      SENSITIVE<=1    CIPROFLOXACIN Value in next row Sensitive      SENSITIVE<=0.25    * PROTEUS MIRABILIS  Culture, blood (routine x 2)     Status: None   Collection Time: 10/20/15  9:57 AM  Result Value Ref Range Status   Specimen Description BLOOD LEFT HAND  Final   Special Requests   Final    BOTTLES DRAWN AEROBIC AND ANAEROBIC AER 3ML ANA 2ML   Culture  Setup Time   Final    GRAM NEGATIVE RODS IN BOTH AEROBIC AND ANAEROBIC BOTTLES CRITICAL VALUE NOTED.  VALUE IS CONSISTENT WITH PREVIOUSLY REPORTED AND CALLED VALUE.    Culture   Final    PROTEUS MIRABILIS IN BOTH AEROBIC AND ANAEROBIC BOTTLES    Report Status 10/22/2015 FINAL  Final   Organism ID, Bacteria PROTEUS MIRABILIS  Final      Susceptibility   Proteus mirabilis - MIC*    AMPICILLIN Value in next row Sensitive      SENSITIVE<=2    PIP/TAZO Value in next row Sensitive      SENSITIVE<=4    CEFTAZIDIME Value in next row Sensitive      SENSITIVE<=1    CEFTRIAXONE Value in next row  Sensitive      SENSITIVE<=1    CEFEPIME Value in next row Sensitive  SENSITIVE<=1    IMIPENEM Value in next row Sensitive      SENSITIVE2    GENTAMICIN Value in next row Sensitive      SENSITIVE<=1    CIPROFLOXACIN Value in next row Sensitive      SENSITIVE<=0.25    * PROTEUS MIRABILIS  Blood Culture ID Panel (Reflexed)     Status: Abnormal   Collection Time: 10/20/15  9:57 AM  Result Value Ref Range Status   Enterococcus species NOT DETECTED NOT DETECTED Final   Listeria monocytogenes NOT DETECTED NOT DETECTED Final   Staphylococcus species NOT DETECTED NOT DETECTED Final   Staphylococcus aureus NOT DETECTED NOT DETECTED Final   Streptococcus species NOT DETECTED NOT DETECTED Final   Streptococcus agalactiae NOT DETECTED NOT DETECTED Final   Streptococcus pneumoniae NOT DETECTED NOT DETECTED Final   Streptococcus pyogenes NOT DETECTED NOT DETECTED Final   Acinetobacter baumannii NOT DETECTED NOT DETECTED Final   Enterobacteriaceae species DETECTED (A) NOT DETECTED Final    Comment: CRITICAL RESULT CALLED TO, READ BACK BY AND VERIFIED WITH: MATT MCBANE ON 10/21/15 AT 0005 [place]    Enterobacter cloacae complex NOT DETECTED NOT DETECTED Final   Escherichia coli NOT DETECTED NOT DETECTED Final   Klebsiella oxytoca NOT DETECTED NOT DETECTED Final   Klebsiella pneumoniae NOT DETECTED NOT DETECTED Final   Proteus species (A) NOT DETECTED Final    CRITICAL RESULT CALLED TO, READ BACK BY AND VERIFIED WITH:    Comment: MATT MCBANE ON 10/21/15 AT 0005 [place]   Serratia marcescens NOT DETECTED NOT DETECTED Final   Haemophilus influenzae NOT DETECTED NOT DETECTED Final   Neisseria meningitidis NOT DETECTED NOT DETECTED Final   Pseudomonas aeruginosa NOT DETECTED NOT DETECTED Final   Candida albicans NOT DETECTED NOT DETECTED Final   Candida glabrata NOT DETECTED NOT DETECTED Final   Candida krusei NOT DETECTED NOT DETECTED Final   Candida parapsilosis NOT DETECTED NOT DETECTED Final    Candida tropicalis NOT DETECTED NOT DETECTED Final   Carbapenem resistance NOT DETECTED NOT DETECTED Final   Methicillin resistance NOT DETECTED NOT DETECTED Final   Vancomycin resistance NOT DETECTED NOT DETECTED Final  MRSA PCR Screening     Status: None   Collection Time: 10/20/15  7:41 PM  Result Value Ref Range Status   MRSA by PCR NEGATIVE NEGATIVE Final    Comment:        The GeneXpert MRSA Assay (FDA approved for NASAL specimens only), is one component of a comprehensive MRSA colonization surveillance program. It is not intended to diagnose MRSA infection nor to guide or monitor treatment for MRSA infections.   CULTURE, BLOOD (ROUTINE X 2) w Reflex to PCR ID Panel     Status: None   Collection Time: 10/22/15 11:39 AM  Result Value Ref Range Status   Specimen Description BLOOD LEFT HAND  Final   Special Requests BOTTLES DRAWN AEROBIC AND ANAEROBIC 1CC  Final   Culture NO GROWTH 5 DAYS  Final   Report Status 10/27/2015 FINAL  Final  Culture, expectorated sputum-assessment     Status: None   Collection Time: 10/22/15 12:00 PM  Result Value Ref Range Status   Specimen Description SPUTUM  Final   Special Requests Normal  Final   Sputum evaluation THIS SPECIMEN IS ACCEPTABLE FOR SPUTUM CULTURE  Final   Report Status 10/22/2015 FINAL  Final  Culture, respiratory (NON-Expectorated)     Status: None   Collection Time: 10/22/15 12:00 PM  Result Value Ref Range Status  Specimen Description SPUTUM  Final   Special Requests Normal Reflexed from [id number]  Final   Gram Stain   Final    FAIR SPECIMEN - 70-80% WBCS FEW WBC SEEN RARE YEAST RARE GRAM NEGATIVE RODS    Culture LIGHT GROWTH ENTEROBACTER AEROGENES  Final   Report Status 10/24/2015 FINAL  Final   Organism ID, Bacteria ENTEROBACTER AEROGENES  Final      Susceptibility   Enterobacter aerogenes - MIC*    CEFAZOLIN >=64 RESISTANT Resistant     CEFEPIME <=1 SENSITIVE Sensitive     CEFTAZIDIME <=1 SENSITIVE Sensitive      CEFTRIAXONE <=1 SENSITIVE Sensitive     CIPROFLOXACIN <=0.25 SENSITIVE Sensitive     GENTAMICIN <=1 SENSITIVE Sensitive     IMIPENEM 2 SENSITIVE Sensitive     TRIMETH/SULFA <=20 SENSITIVE Sensitive     PIP/TAZO <=4 SENSITIVE Sensitive     * LIGHT GROWTH ENTEROBACTER AEROGENES  Urine culture     Status: None   Collection Time: 10/22/15 12:45 PM  Result Value Ref Range Status   Specimen Description URINE, RANDOM  Final   Special Requests NONE  Final   Culture NO GROWTH 2 DAYS  Final   Report Status 10/24/2015 FINAL  Final  Urine culture     Status: None   Collection Time: 10/28/15  2:36 PM  Result Value Ref Range Status   Specimen Description URINE, RANDOM  Final   Special Requests NONE  Final   Culture NO GROWTH 2 DAYS  Final   Report Status 10/30/2015 FINAL  Final     Studies/Results: Dg Chest Port 1 View  11/04/2015  CLINICAL DATA:  PICC line insertion. EXAM: PORTABLE CHEST 1 VIEW COMPARISON:  Chest x-ray dated 11/01/2015. FINDINGS: Left-sided central line in place with tip stable in position at the upper margin of the SVC. New right-sided PICC line now in place with tip well-positioned in the lower SVC. Cardiomediastinal silhouette is stable in size and configuration. Central pulmonary vascular congestion and bilateral interstitial edema persists, but decreased compared to the previous exam. Opacities at each lung base most likely representing a combination of small pleural effusions and atelectasis. IMPRESSION: 1. Right-sided PICC line placement with tip well-positioned in the lower SVC, near the expected level of the cavoatrial junction. 2. Left-sided central line stable in position with tip at the upper margin of the SVC. 3. Persistent central pulmonary vascular congestion and bilateral interstitial edema, but improved compared to the previous exam suggesting improved fluid status. 4. Probable small bilateral pleural effusions and bibasilar atelectasis. Electronically Signed   By: Franki Cabot M.D.   On: 11/04/2015 14:37    Assessment/Plan: ZAIDEE RION is a 70 y.o. female found down and admitted with ARF,  urosepsis with proteus (blood and urine), acute resp failure with sputum cxs growing enterobacter.  Clinically improving but has residual bil LE gangrene, elevated WBC and noted to have R hydro on renal US. Repeat UA neg and ucx neg. CXR with no consolidation.  Currently day 18  abx. Last fever 101 on 2/3. WBC remains elevated but down from 33->16  Had ureteral stent place 2/7.  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  Can use doxy if needed if Dr  Dew suggests for the dry gangrene.   Thank you very much for the consult. Will follow with you.  [place], [place]   11/06/2015, 2:08 PM

## 2015-11-06 NOTE — Progress Notes (Signed)
Speech Language Pathology Treatment: Dysphagia  Patient Details Name: April [name] MRN: [id number] DOB: [birth year] Today's Date: 11/06/2015 Time: 1225-1310 SLP Time Calculation (min) (ACUTE ONLY): 45 min  Assessment / Plan / Recommendation Clinical Impression  Pt appears to adequately tolerate trials of thin liquids and soft solids of a Dys. 2 diet w/ no overt s/s of aspiration noted and no significant oral phase deficits notes; pt required min. Increased oral phase time for thorough mastication but cleared orally b/t trials. Thin liquids were consumed via cup and straw w/ no overt s/s of aspiration noted this session. Pt continues to need rest breaks b/t trials to lessen exertion and increased WOB/fatigue which can increase risk for aspiration. Pt continues to demo. UE weakness and requests to be fed; this can increase risk for aspiration. Rec. OT consult. ST will continue to f/u w/ toleration of diet and trials to upgrade as appropriate. Pt agreed. NSG updated.    HPI HPI: Pt is a 70 y.o. female with no past medical history comes to the emergency room after she was found lying on the floor minimally responsive. Patient had not shown up to work for 2 days and coworkers checked on her and they found her lying on the floor. Down time not known. She was reportedly severely swollen at baseline by coworker. Pt was emergently intubated upon arrival to the ED. She has since been extubated on Feb. 2 but has required BiPAP support intermittently and has not been able to participate in a BSE. There is some question that pt is not at her baseline mental status as well. Pt continues to have respiratory issues requiring increased O2 support per RT and NSG. She had stents placed in the kidneys yesterday per chart notes. At rest, pt exhibits min. increased respiratory effort w/ exertion: O2 sats 94%, RR low 20's. Pt followed basic commands; conversed about self and preferences. Mild cough but more productive at times.  Pt is tolerating her current dysphagia 1 diet w/ thin liquids per report; no overt s/s of aspiration have been noted. Pt is able to follow along w/ general conversation today; some confusion noted in some exchanges.       SLP Plan  Continue with current plan of care     Recommendations  Diet recommendations: Dysphagia 2 (fine chop);Thin liquid Liquids provided via: Cup;Straw Medication Administration: Crushed with puree Supervision: Staff to assist with self feeding;Full supervision/cueing for compensatory strategies Compensations: Minimize environmental distractions;Slow rate;Small sips/bites (rest breaks) Postural Changes and/or Swallow Maneuvers: Seated upright 90 degrees             General recommendations: OT consult (UE weakness) Oral Care Recommendations: Oral care BID;Staff/trained caregiver to provide oral care Follow up Recommendations: Skilled Nursing facility Plan: Continue with current plan of care     Solomon, Roy, CCC-SLP  Watson,Katherine 11/06/2015, 5:59 PM

## 2015-11-06 NOTE — Progress Notes (Signed)
[facility] for Electrolyte Management Indication: hypokalemia    Allergies  Allergen Reactions  . Prednisone Anaphylaxis    Patient Measurements: Height: '5\' 7"'$  (170.2 cm) Weight: 269 lb 1.6 oz (122.063 kg) IBW/kg (Calculated) : 61.6   Vital Signs: Temp: 98 F (36.7 C) (02/10 0521) Temp Source: Oral (02/10 0521) BP: 152/51 mmHg (02/10 0521) Pulse Rate: 86 (02/10 0521) Intake/Output from previous day: 02/09 0701 - 02/10 0700 In: 480 [P.O.:480] Out: 2000 [Urine:2000] Intake/Output from this shift:   Vent settings for last 24 hours:    Labs:  Recent Labs  11/04/15 0533 11/05/15 0500 11/06/15 0529  WBC 18.4* 19.4* 16.6*  HGB 8.5* 9.3* 9.1*  HCT 25.7* 28.0* 27.9*  PLT 187 216 211  CREATININE 0.98 0.92 0.93  MG 2.0 1.8  --   PHOS 2.8  --   --    Estimated Creatinine Clearance: 77.3 mL/min (by C-G formula based on Cr of 0.93).   Assessment: 70 y/o F with ARF recently transitioned from CRRT to HD not currently requiring HD.   2/8: Potassium low at 2.9 this AM and replaced with 4 runs of KCl 10 meq and 40 meq KCl po. Will recheck potasium at 1800. --> only got 3 runs, no PO  2/8 PM K+ 3.3. KCl 10 mEq IV x 2 doses ordered. BMP scheduled in AM.  2/9 K 3.6, Mg 1.8 2/10: K: 3.3   Plan:  K WNL but has been low the past few days. Will order KCl 40 mEq PO x1. BMET in AM to f/u.   Pharmacy will continue to monitor and adjust per consult.     Larene Beach, PharmD BCPS Clinical Pharmacist 11/06/2015 9:16 AM

## 2015-11-07 LAB — BASIC METABOLIC PANEL
Anion gap: 7 (ref 5–15)
BUN: 20 mg/dL (ref 6–20)
CHLORIDE: 111 mmol/L (ref 101–111)
CO2: 24 mmol/L (ref 22–32)
CREATININE: 0.86 mg/dL (ref 0.44–1.00)
Calcium: 7.6 mg/dL — ABNORMAL LOW (ref 8.9–10.3)
GFR calc Af Amer: >60 mL/min (ref >60)
Glucose, Bld: 143 mg/dL — ABNORMAL HIGH (ref 65–99)
Potassium: 3.3 mmol/L — ABNORMAL LOW (ref 3.5–5.1)
SODIUM: 142 mmol/L (ref 135–145)

## 2015-11-07 LAB — MAGNESIUM: Magnesium: 1.6 mg/dL — ABNORMAL LOW (ref 1.7–2.4)

## 2015-11-07 LAB — CBC
HCT: 26.5 % — ABNORMAL LOW (ref 35.0–47.0)
Hemoglobin: 8.8 g/dL — ABNORMAL LOW (ref 12.0–16.0)
MCH: 30.4 pg (ref 26.0–34.0)
MCHC: 33.2 g/dL (ref 32.0–36.0)
MCV: 91.7 fL (ref 80.0–100.0)
PLATELETS: 207 10*3/uL (ref 150–440)
RBC: 2.88 MIL/uL — ABNORMAL LOW (ref 3.80–5.20)
RDW: 14.5 % (ref 11.5–14.5)
WBC: 14.9 10*3/uL — ABNORMAL HIGH (ref 3.6–11.0)

## 2015-11-07 LAB — GLUCOSE, CAPILLARY
GLUCOSE-CAPILLARY: 117 mg/dL — AB (ref 65–99)
GLUCOSE-CAPILLARY: 210 mg/dL — AB (ref 65–99)
Glucose-Capillary: 149 mg/dL — ABNORMAL HIGH (ref 65–99)
Glucose-Capillary: 164 mg/dL — ABNORMAL HIGH (ref 65–99)
Glucose-Capillary: 167 mg/dL — ABNORMAL HIGH (ref 65–99)

## 2015-11-07 MED ORDER — POTASSIUM CHLORIDE 20 MEQ PO PACK
40.0000 meq | PACK | Freq: Two times a day (BID) | ORAL | Status: AC
  Administered 2015-11-07 (×2): 40 meq via ORAL
  Filled 2015-11-07 (×2): qty 2

## 2015-11-07 MED ORDER — MAGNESIUM SULFATE 2 GM/50ML IV SOLN
2.0000 g | Freq: Once | INTRAVENOUS | Status: AC
  Administered 2015-11-07: 2 g via INTRAVENOUS
  Filled 2015-11-07: qty 50

## 2015-11-07 MED ORDER — POTASSIUM CHLORIDE 20 MEQ PO PACK: 20.0000 meq | PACK | Freq: Every day | ORAL | Status: DC

## 2015-11-07 NOTE — Progress Notes (Signed)
[facility] for Electrolyte Management Indication: hypokalemia    Allergies  Allergen Reactions  . Prednisone Anaphylaxis    Patient Measurements: Height: '5\' 7"'$  (170.2 cm) Weight: 262 lb 4 oz (118.956 kg) IBW/kg (Calculated) : 61.6   Vital Signs: Temp: 98 F (36.7 C) (02/11 0455) Temp Source: Oral (02/11 0455) BP: 139/56 mmHg (02/11 0455) Pulse Rate: 80 (02/11 0455) Intake/Output from previous day: 02/10 0701 - 02/11 0700 In: 476 [P.O.:476] Out: 750 [Urine:750] Intake/Output from this shift: Total I/O In: 60 [P.O.:60] Out: 0  Vent settings for last 24 hours:    Labs:  Recent Labs  11/05/15 0500 11/06/15 0529 11/07/15 0500  WBC 19.4* 16.6* 14.9*  HGB 9.3* 9.1* 8.8*  HCT 28.0* 27.9* 26.5*  PLT 216 211 207  CREATININE 0.92 0.93 0.86  MG 1.8  --  1.6*   Estimated Creatinine Clearance: 82.5 mL/min (by C-G formula based on Cr of 0.86).    Lab Results  Component Value Date   K 3.3* 11/07/2015   Mag= 1.6   11/07/15   Assessment: 70 y/o F with ARF recently transitioned from CRRT to HD not currently requiring HD.   2/8: Potassium low at 2.9 this AM and replaced with 4 runs of KCl 10 meq and 40 meq KCl po. Will recheck potasium at 1800. --> only got 3 runs, no PO  2/8 PM K+ 3.3. KCl 10 mEq IV x 2 doses ordered. BMP scheduled in AM.  2/9 K 3.6, Mg 1.8 2/10: K: 3.3   2/11 K 3.3  Mag 1.6  Plan:  Will order Magnesium sulfate 2 gram IV x1. Will order KCl 40 mEq PO x2.  Will then order Klor-Con 80mq po Daily (Potassium has been replaced 4 out of last 5 days). BMET in AM to f/u.   Pharmacy will continue to monitor and adjust per consult.     KChinita GreenlandPharmD Clinical Pharmacist 11/07/2015 9:02 AM

## 2015-11-07 NOTE — Progress Notes (Signed)
[facility] [place] NAME: April [name]    MR#:  [id number]  DATE OF BIRTH:  08-27-1946  SUBJECTIVE:   No acute events overnight. Afebrile, hemodynamically stable. Follows commands and taking PO well.    REVIEW OF SYSTEMS:   Review of Systems  Constitutional: Negative for fever and chills.  HENT: Negative for congestion and tinnitus.   Eyes: Negative for blurred vision and double vision.  Respiratory: Negative for cough, shortness of breath and wheezing.   Cardiovascular: Negative for chest pain, orthopnea and PND.  Gastrointestinal: Negative for nausea, vomiting, abdominal pain and diarrhea.  Genitourinary: Negative for dysuria and hematuria.  Neurological: Negative for dizziness, sensory change and focal weakness.  All other systems reviewed and are negative.   Tolerating Diet: Dysphagia 2  Tolerating PT: eval noted.   DRUG ALLERGIES:   Allergies  Allergen Reactions  . Prednisone Anaphylaxis    VITALS:  Blood pressure 139/56, pulse 80, temperature 98 F (36.7 C), temperature source Oral, resp. rate 16, height '5\' 7"'$  (1.702 m), weight 118.956 kg (262 lb 4 oz), SpO2 93 %.  PHYSICAL EXAMINATION:   Physical Exam   GENERAL:  70 y.o.-year-old April [name] lying in the bed in NAD and follows commands and answers questions appropriately.  EYES: Pupils equal, round, reactive to light, accomodation. (-) scleral icterus.  HEENT: Head atraumatic, normocephalic. Moist Oral Mucosa NECK:  Supple, no jugular venous distention. No thyroid enlargement, no tenderness.  LUNGS: Good air entry bilaterally, no rales or rhonchi or wheezes. No use of accessory muscles. CARDIOVASCULAR: S1, S2 normal. No murmurs, rubs, or gallops.  ABDOMEN: Soft, nontender, nondistended. Bowel sounds present. No organomegaly or mass. EXTREMITIES: Gangrenous toes bilaterally, +2 edema bilaterally.  NEUROLOGIC: Globally weak, follows simple commands.  PSYCHIATRIC:  Alert and awake oriented 3, good affect  SKIN: Skin blisters noted on the lower extremities bilaterally, cyanotic appearing and gangrenous toes bilaterally.   LABORATORY PANEL:  CBC  Recent Labs Lab 11/07/15 0500  WBC 14.9*  HGB 8.8*  HCT 26.5*  PLT 207    Chemistries   Recent Labs Lab 11/03/15 0539  11/07/15 0500  NA 146*  < > 142  K 2.8*  < > 3.3*  CL 114*  < > 111  CO2 29  < > 24  GLUCOSE 255*  < > 143*  BUN 28*  < > 20  CREATININE 1.06*  < > 0.86  CALCIUM 7.5*  < > 7.6*  MG 1.8  < > 1.6*  AST 20  --   --   ALT 26  --   --   ALKPHOS 87  --   --   BILITOT 0.7  --   --   < > = values in this interval not displayed. Cardiac Enzymes No results for input(s): TROPONINI in the last 168 hours. RADIOLOGY:  No results found. ASSESSMENT AND PLAN:   70 year old female presented to the emergency room after she was found lying on the floor with minimal responsiveness.  1. Severe Septic shock, hypovolemic shock: due to proteus bacteremia, from UTI - finished 18 days of IV abx therapy and appreciate ID input and no need for further abx presently.  - afebrile, hemodynamically stable.    2. Acute renal failure: - was due to ATN in the setting of sepsis and rhabdomyolysis.  - was on CRRT but it was stopped due to thrombocytopenia.  Had short course of HD 1/28 - Urine output is good and  Cr. Back to baseline.   - pt. Was noted to have RIGHT hydronephrosis question UPJ obstruction on CT Scan abd/pelvis on admission and repeat CT abd/pelvis 1/31 showing the same. S/p ureteral stent placement by Dr. Erlene Quan on 11/03/15.    3. Severe leukocytosis: Likely due to underlying sepsis and improving.   4. Atrial fibrillation with RVR - rate controlled now.  Off amio now.  - Echo showing normal LV function w/ EF of 55%.    5. DM-2 , new onset - A17 was 7.7.  Cont. Lantus, SSI and follow BS which are stable so far.    6. Acute rhabdomyolysis secondary to April [name] being found down on the  floor:  - CPK's have trended down. Resolved.   7. 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 Was + but improving.  - plt count normal now. No acute bleeding. .   8. Hypokalemia -  Cont. To supplement and will repeat level in a.m.   9. Hypernatremia - sodium normalized now and pt. Is off D5W  10.  Gangrenous Toes b/l - this is due to severe hypotension and septic shock from her admission. -Appreciate vascular surgery input. April [name] likely will need bilateral transmetatarsal versus bilateral below the knee amputations. Await for further demarcation and this is to be done as an outpatient down the road. -We'll likely need to be an empiric antibiotics and discussed with vascular surgery and will discharge on Augmentin.  Likely d/c to STR next week.    CODE STATUS: Full  DVT Prophylaxis: Lovenox  TOTAL critical TIME TAKING CARE OF THIS April [name]: 25 minutes.   Note: This dictation was prepared with Dragon dictation along with smaller phrase technology. Any transcriptional errors that result from this process are unintentional.  Henreitta Leber M.D on 11/07/2015 at 2:50 PM  Between 7am to 6pm - Pager - [phone]  After 6pm go to [email or web address] - password [email or web address] [facility]  Office  [phone]  CC: Primary care physician; Albina Billet, MD

## 2015-11-07 NOTE — Progress Notes (Signed)
[facility]  ROUNDING NOTE   Subjective:  Patient doing well at the moment. Serum sodium normalized at 142. Renal function also is normal.   Objective:  Vital signs in last 24 hours:  Temp:  [97.9 F (36.6 C)-98 F (36.7 C)] 98 F (36.7 C) (02/11 0455) Pulse Rate:  [80-96] 80 (02/11 0455) Resp:  [16-21] 16 (02/11 0455) BP: (139-156)/(56-65) 139/56 mmHg (02/11 0455) SpO2:  [92 %-95 %] 93 % (02/11 0728) Weight:  [118.956 kg (262 lb 4 oz)] 118.956 kg (262 lb 4 oz) (02/11 0601)  Weight change: -3.107 kg (-6 lb 13.6 oz) Filed Weights   11/05/15 0500 11/06/15 0500 11/07/15 0601  Weight: 121.519 kg (267 lb 14.4 oz) 122.063 kg (269 lb 1.6 oz) 118.956 kg (262 lb 4 oz)    Intake/Output: I/O last 3 completed shifts: In: 76 [P.O.:476] Out: 2426 [Urine:1650]   Intake/Output this shift:  Total I/O In: 60 [P.O.:60] Out: 0   Physical Exam: General: No acute distress  Head: [place]/AT hard of hearing OM moist  Eyes: Eyes open   Neck: supple  Lungs:  Scattered rhonchi, normal effort  Heart: Regular, no rub   Abdomen:  Soft, nontender, obese  Extremities: 3+ peripheral edema. +anasarca  Neurologic:  alert, able to follow simple commands   Skin: Feet in soft support, wounds noted b/l LE's       Basic Metabolic Panel:  Recent Labs Lab 11/01/15 0455  11/03/15 0539  11/04/15 0533 11/04/15 2247 11/05/15 0500 11/06/15 0529 11/07/15 0500  NA 156*  < > 146*  --  141  --  146* 144 142  K 3.3*  < > 2.8*  < > 2.9* 3.3* 3.6 3.3* 3.3*  CL 123*  < > 114*  --  111  --  115* 115* 111  CO2 30  < > 29  --  24  --  '28 26 24  '$ GLUCOSE 144*  < > 255*  --  192*  --  129* 144* 143*  BUN 39*  < > 28*  --  23*  --  22* 22* 20  CREATININE 1.09*  < > 1.06*  --  0.98  --  0.92 0.93 0.86  CALCIUM 7.6*  < > 7.5*  --  7.4*  --  7.8* 7.9* 7.6*  MG 2.0  --  1.8  --  2.0  --  1.8  --  1.6*  PHOS 3.2  --  3.6  --  2.8  --   --   --   --   < > = values in this interval not  displayed.  Liver Function Tests:  Recent Labs Lab 11/02/15 0509 11/03/15 0539  AST 20 20  ALT 29 26  ALKPHOS 80 87  BILITOT 1.0 0.7  PROT 5.8* 5.4*  ALBUMIN 1.7* 1.6*   No results for input(s): LIPASE, AMYLASE in the last 168 hours. No results for input(s): AMMONIA in the last 168 hours.  CBC:  Recent Labs Lab 11/02/15 0509 11/04/15 0533 11/05/15 0500 11/06/15 0529 11/07/15 0500  WBC 33.5* 18.4* 19.4* 16.6* 14.9*  HGB 9.3* 8.5* 9.3* 9.1* 8.8*  HCT 29.1* 25.7* 28.0* 27.9* 26.5*  MCV 94.1 91.1 91.3 91.8 91.7  PLT 203 187 216 211 207    Cardiac Enzymes: No results for input(s): CKTOTAL, CKMB, CKMBINDEX, TROPONINI in the last 168 hours.  BNP: Invalid input(s): POCBNP  CBG:  Recent Labs Lab 11/06/15 0731 11/06/15 1125 11/06/15 1653 11/06/15 2206 11/07/15 0745  GLUCAP 130*  177* 165* 184* 117*    Microbiology: Results for orders placed or performed during the hospital encounter of 10/20/15  Urine culture     Status: None   Collection Time: 10/20/15  9:57 AM  Result Value Ref Range Status   Specimen Description URINE, RANDOM  Final   Special Requests NONE  Final   Culture >=100,000 COLONIES/mL PROTEUS MIRABILIS  Final   Report Status 10/22/2015 FINAL  Final   Organism ID, Bacteria PROTEUS MIRABILIS  Final      Susceptibility   Proteus mirabilis - MIC*    AMPICILLIN <=2 SENSITIVE Sensitive     GENTAMICIN <=1 SENSITIVE Sensitive     CEFTRIAXONE Value in next row Sensitive      SENSITIVE<=1    CIPROFLOXACIN Value in next row Sensitive      SENSITIVE<=0.25    IMIPENEM Value in next row Sensitive      SENSITIVE2    NITROFURANTOIN Value in next row Resistant      RESISTANT128    TRIMETH/SULFA Value in next row Sensitive      SENSITIVE<=20    PIP/TAZO Value in next row Sensitive      SENSITIVE<=4    AMPICILLIN/SULBACTAM Value in next row Sensitive      SENSITIVE<=2    * >=100,000 COLONIES/mL PROTEUS MIRABILIS  Culture, blood (routine x 2)     Status:  None   Collection Time: 10/20/15  9:57 AM  Result Value Ref Range Status   Specimen Description BLOOD RIGHT WRIST  Final   Special Requests   Final    BOTTLES DRAWN AEROBIC AND ANAEROBIC ANA 1ML AER 4ML   Culture  Setup Time   Final    GRAM NEGATIVE RODS IN BOTH AEROBIC AND ANAEROBIC BOTTLES CRITICAL RESULT CALLED TO, READ BACK BY AND VERIFIED WITH: MATT MCBANE ON 10/21/15 AT 0005 [facility] CONFIRMED BY [place]    Culture   Final    PROTEUS MIRABILIS IN BOTH AEROBIC AND ANAEROBIC BOTTLES    Report Status 10/22/2015 FINAL  Final   Organism ID, Bacteria PROTEUS MIRABILIS  Final      Susceptibility   Proteus mirabilis - MIC*    AMPICILLIN/SULBACTAM Value in next row Sensitive      SENSITIVE<=2    PIP/TAZO Value in next row Sensitive      SENSITIVE<=4    CEFTRIAXONE Value in next row Sensitive      SENSITIVE<=1    IMIPENEM Value in next row Sensitive      SENSITIVE2    GENTAMICIN Value in next row Sensitive      SENSITIVE<=1    CIPROFLOXACIN Value in next row Sensitive      SENSITIVE<=0.25    * PROTEUS MIRABILIS  Culture, blood (routine x 2)     Status: None   Collection Time: 10/20/15  9:57 AM  Result Value Ref Range Status   Specimen Description BLOOD LEFT HAND  Final   Special Requests   Final    BOTTLES DRAWN AEROBIC AND ANAEROBIC AER 3ML ANA 2ML   Culture  Setup Time   Final    GRAM NEGATIVE RODS IN BOTH AEROBIC AND ANAEROBIC BOTTLES CRITICAL VALUE NOTED.  VALUE IS CONSISTENT WITH PREVIOUSLY REPORTED AND CALLED VALUE.    Culture   Final    PROTEUS MIRABILIS IN BOTH AEROBIC AND ANAEROBIC BOTTLES    Report Status 10/22/2015 FINAL  Final   Organism ID, Bacteria PROTEUS MIRABILIS  Final      Susceptibility   Proteus mirabilis -  MIC*    AMPICILLIN Value in next row Sensitive      SENSITIVE<=2    PIP/TAZO Value in next row Sensitive      SENSITIVE<=4    CEFTAZIDIME Value in next row Sensitive      SENSITIVE<=1    CEFTRIAXONE Value in next row Sensitive      SENSITIVE<=1     CEFEPIME Value in next row Sensitive      SENSITIVE<=1    IMIPENEM Value in next row Sensitive      SENSITIVE2    GENTAMICIN Value in next row Sensitive      SENSITIVE<=1    CIPROFLOXACIN Value in next row Sensitive      SENSITIVE<=0.25    * PROTEUS MIRABILIS  Blood Culture ID Panel (Reflexed)     Status: Abnormal   Collection Time: 10/20/15  9:57 AM  Result Value Ref Range Status   Enterococcus species NOT DETECTED NOT DETECTED Final   Listeria monocytogenes NOT DETECTED NOT DETECTED Final   Staphylococcus species NOT DETECTED NOT DETECTED Final   Staphylococcus aureus NOT DETECTED NOT DETECTED Final   Streptococcus species NOT DETECTED NOT DETECTED Final   Streptococcus agalactiae NOT DETECTED NOT DETECTED Final   Streptococcus pneumoniae NOT DETECTED NOT DETECTED Final   Streptococcus pyogenes NOT DETECTED NOT DETECTED Final   Acinetobacter baumannii NOT DETECTED NOT DETECTED Final   Enterobacteriaceae species DETECTED (A) NOT DETECTED Final    Comment: CRITICAL RESULT CALLED TO, READ BACK BY AND VERIFIED WITH: MATT MCBANE ON 10/21/15 AT 0005 [place]    Enterobacter cloacae complex NOT DETECTED NOT DETECTED Final   Escherichia coli NOT DETECTED NOT DETECTED Final   Klebsiella oxytoca NOT DETECTED NOT DETECTED Final   Klebsiella pneumoniae NOT DETECTED NOT DETECTED Final   Proteus species (A) NOT DETECTED Final    CRITICAL RESULT CALLED TO, READ BACK BY AND VERIFIED WITH:    Comment: MATT MCBANE ON 10/21/15 AT 0005 [place]   Serratia marcescens NOT DETECTED NOT DETECTED Final   Haemophilus influenzae NOT DETECTED NOT DETECTED Final   Neisseria meningitidis NOT DETECTED NOT DETECTED Final   Pseudomonas aeruginosa NOT DETECTED NOT DETECTED Final   Candida albicans NOT DETECTED NOT DETECTED Final   Candida glabrata NOT DETECTED NOT DETECTED Final   Candida krusei NOT DETECTED NOT DETECTED Final   Candida parapsilosis NOT DETECTED NOT DETECTED Final   Candida tropicalis NOT DETECTED NOT  DETECTED Final   Carbapenem resistance NOT DETECTED NOT DETECTED Final   Methicillin resistance NOT DETECTED NOT DETECTED Final   Vancomycin resistance NOT DETECTED NOT DETECTED Final  MRSA PCR Screening     Status: None   Collection Time: 10/20/15  7:41 PM  Result Value Ref Range Status   MRSA by PCR NEGATIVE NEGATIVE Final    Comment:        The GeneXpert MRSA Assay (FDA approved for NASAL specimens only), is one component of a comprehensive MRSA colonization surveillance program. It is not intended to diagnose MRSA infection nor to guide or monitor treatment for MRSA infections.   CULTURE, BLOOD (ROUTINE X 2) w Reflex to PCR ID Panel     Status: None   Collection Time: 10/22/15 11:39 AM  Result Value Ref Range Status   Specimen Description BLOOD LEFT HAND  Final   Special Requests BOTTLES DRAWN AEROBIC AND ANAEROBIC 1CC  Final   Culture NO GROWTH 5 DAYS  Final   Report Status 10/27/2015 FINAL  Final  Culture, expectorated sputum-assessment  Status: None   Collection Time: 10/22/15 12:00 PM  Result Value Ref Range Status   Specimen Description SPUTUM  Final   Special Requests Normal  Final   Sputum evaluation THIS SPECIMEN IS ACCEPTABLE FOR SPUTUM CULTURE  Final   Report Status 10/22/2015 FINAL  Final  Culture, respiratory (NON-Expectorated)     Status: None   Collection Time: 10/22/15 12:00 PM  Result Value Ref Range Status   Specimen Description SPUTUM  Final   Special Requests Normal Reflexed from [id number]  Final   Gram Stain   Final    FAIR SPECIMEN - 70-80% WBCS FEW WBC SEEN RARE YEAST RARE GRAM NEGATIVE RODS    Culture LIGHT GROWTH ENTEROBACTER AEROGENES  Final   Report Status 10/24/2015 FINAL  Final   Organism ID, Bacteria ENTEROBACTER AEROGENES  Final      Susceptibility   Enterobacter aerogenes - MIC*    CEFAZOLIN >=64 RESISTANT Resistant     CEFEPIME <=1 SENSITIVE Sensitive     CEFTAZIDIME <=1 SENSITIVE Sensitive     CEFTRIAXONE <=1 SENSITIVE Sensitive      CIPROFLOXACIN <=0.25 SENSITIVE Sensitive     GENTAMICIN <=1 SENSITIVE Sensitive     IMIPENEM 2 SENSITIVE Sensitive     TRIMETH/SULFA <=20 SENSITIVE Sensitive     PIP/TAZO <=4 SENSITIVE Sensitive     * LIGHT GROWTH ENTEROBACTER AEROGENES  Urine culture     Status: None   Collection Time: 10/22/15 12:45 PM  Result Value Ref Range Status   Specimen Description URINE, RANDOM  Final   Special Requests NONE  Final   Culture NO GROWTH 2 DAYS  Final   Report Status 10/24/2015 FINAL  Final  Urine culture     Status: None   Collection Time: 10/28/15  2:36 PM  Result Value Ref Range Status   Specimen Description URINE, RANDOM  Final   Special Requests NONE  Final   Culture NO GROWTH 2 DAYS  Final   Report Status 10/30/2015 FINAL  Final    Coagulation Studies: No results for input(s): LABPROT, INR in the last 72 hours.  Urinalysis: No results for input(s): COLORURINE, LABSPEC, PHURINE, GLUCOSEU, HGBUR, BILIRUBINUR, KETONESUR, PROTEINUR, UROBILINOGEN, NITRITE, LEUKOCYTESUR in the last 72 hours.  Invalid input(s): APPERANCEUR    Imaging: No results found.   Medications:     . antiseptic oral rinse  7 mL Mouth Rinse BID  . enoxaparin (LOVENOX) injection  40 mg Subcutaneous Q12H  . ferrous sulfate  325 mg Oral BID WC  . insulin aspart  0-5 Units Subcutaneous QHS  . insulin aspart  0-9 Units Subcutaneous TID WC  . insulin glargine  15 Units Subcutaneous Daily  . ipratropium-albuterol  3 mL Nebulization Q6H  . magnesium sulfate 1 - 4 g bolus IVPB  2 g Intravenous Once  . [START ON 11/08/2015] potassium chloride  20 mEq Oral Daily  . potassium chloride  40 mEq Oral BID   [DISCONTINUED] acetaminophen **OR** acetaminophen, bisacodyl, iohexol, morphine injection, ondansetron (ZOFRAN) IV, prochlorperazine, sodium chloride flush  Assessment/ Plan:  [name] is a 70 y.o. white female with DM (A1c 7.7%),  who was admitted to [facility] on 10/20/2015  1. Acute renal failure with  metabolic acidosis:  - Unknown baseline creatinine. Admit Cr 3.01 Required CRRT from 1/24 to 1/27. Then intermittent hemodialysis 1/28. Acute renal failure from sepsis leading to ATN. Serologic testing negative 10/21/15 - Renal function now normalized. 4. At time patient required CRRT and also underwent right ureteral stent  placement.  2. Hypernatremia - from post ATN diuresis - Now resolved. Continue to monitor serum sodium periodically.  3. Hypokalemia: post ATN diuresis.  - Potassium low again at 3.3.  Patient continues on oral magnesium repletion and is also receiving magnesium.  4. Sepsis/hypotension secondary to urinary tract infection: proteus - Patient has completed antibiotic therapy.  5. Anasarca - Patient should begin mobilizing fluid now that her renal function has improved.  6. 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  S/p right ureteral stent placement 11/03/15 by Dr. Erlene Quan.  7. 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  Platelets normal at 207,000.  9.  No further renal input, will sign off, please call back with any questions.     LOS: [place] 2/11/20179:54 AM

## 2015-11-08 LAB — GLUCOSE, CAPILLARY
GLUCOSE-CAPILLARY: 105 mg/dL — AB (ref 65–99)
GLUCOSE-CAPILLARY: 151 mg/dL — AB (ref 65–99)
GLUCOSE-CAPILLARY: 132 mg/dL — AB (ref 65–99)
GLUCOSE-CAPILLARY: 139 mg/dL — AB (ref 65–99)

## 2015-11-08 LAB — BASIC METABOLIC PANEL
ANION GAP: 5 (ref 5–15)
BUN: 16 mg/dL (ref 6–20)
CALCIUM: 7.9 mg/dL — AB (ref 8.9–10.3)
CHLORIDE: 110 mmol/L (ref 101–111)
CO2: 27 mmol/L (ref 22–32)
Creatinine, Ser: 0.75 mg/dL (ref 0.44–1.00)
GFR calc non Af Amer: >60 mL/min (ref >60)
GLUCOSE: 124 mg/dL — AB (ref 65–99)
POTASSIUM: 3.3 mmol/L — AB (ref 3.5–5.1)
Sodium: 142 mmol/L (ref 135–145)

## 2015-11-08 LAB — MAGNESIUM: Magnesium: 1.7 mg/dL (ref 1.7–2.4)

## 2015-11-08 MED ORDER — MAGNESIUM SULFATE 4 GM/100ML IV SOLN
4.0000 g | Freq: Once | INTRAVENOUS | Status: AC
  Administered 2015-11-08: 4 g via INTRAVENOUS
  Filled 2015-11-08: qty 100

## 2015-11-08 MED ORDER — POTASSIUM CHLORIDE 20 MEQ PO PACK
40.0000 meq | PACK | Freq: Every day | ORAL | Status: DC
  Administered 2015-11-08 – 2015-11-09 (×2): 40 meq via ORAL
  Filled 2015-11-08 (×2): qty 2

## 2015-11-08 MED ORDER — IPRATROPIUM-ALBUTEROL 0.5-2.5 (3) MG/3ML IN SOLN: 3.0000 mL | Freq: Four times a day (QID) | RESPIRATORY_TRACT | Status: DC | PRN

## 2015-11-08 NOTE — Progress Notes (Signed)
[place] for Electrolyte Management Indication: hypokalemia    Allergies  Allergen Reactions  . Prednisone Anaphylaxis    Patient Measurements: Height: '5\' 7"'$  (170.2 cm) Weight: 262 lb 4 oz (118.956 kg) IBW/kg (Calculated) : 61.6   Vital Signs: Temp: 98 F (36.7 C) (02/12 0550) Temp Source: Oral (02/12 0550) BP: 145/66 mmHg (02/12 0550) Pulse Rate: 85 (02/12 0550) Intake/Output from previous day: 02/11 0701 - 02/12 0700 In: 980 [P.O.:980] Out: 0  Intake/Output from this shift:   Vent settings for last 24 hours:    Labs:  Recent Labs  11/06/15 0529 11/07/15 0500 11/08/15 0458  WBC 16.6* 14.9*  --   HGB 9.1* 8.8*  --   HCT 27.9* 26.5*  --   PLT 211 207  --   CREATININE 0.93 0.86 0.75  MG  --  1.6* 1.7   Estimated Creatinine Clearance: 88.6 mL/min (by C-G formula based on Cr of 0.75).    Lab Results  Component Value Date   K 3.3* 11/08/2015   Mag= 1.6   11/07/15   Assessment: 70 y/o F with ARF recently transitioned from CRRT to HD not currently requiring HD.  2/12: Scr 0.75- renal fxn normalized  2/8: Potassium low at 2.9 this AM and replaced with 4 runs of KCl 10 meq and 40 meq KCl po. Will recheck potasium at 1800. --> only got 3 runs, no PO  2/8 PM K+ 3.3. KCl 10 mEq IV x 2 doses ordered. BMP scheduled in AM.  2/9 K 3.6, Mg 1.8 2/10: K: 3.3    Plan:  Will order Magnesium sulfate 2 gram IV x1. Will order KCl 40 mEq PO x2.  Will then order Klor-Con 47mq po Daily (Potassium has been replaced 4 out of last 5 days). BMET in AM to f/u.   2/12: K=3.3  Mag=1.7.  Will give Magnesium 4 gram IV x1. Will increase KCL PO to 40 meq daily. F/u labs in am.  Pharmacy will continue to monitor and adjust per consult.     KChinita GreenlandPharmD Clinical Pharmacist 11/08/2015 9:31 AM

## 2015-11-08 NOTE — Progress Notes (Signed)
[place] [facility] [place] NAME: April [name]    MR#:  [id number]  DATE OF BIRTH:  12/04/1945  SUBJECTIVE:   No complaints presently.  Afebrile.      REVIEW OF SYSTEMS:   Review of Systems  Constitutional: Negative for fever and chills.  HENT: Negative for congestion and tinnitus.   Eyes: Negative for blurred vision and double vision.  Respiratory: Negative for cough, shortness of breath and wheezing.   Cardiovascular: Negative for chest pain, orthopnea and PND.  Gastrointestinal: Negative for nausea, vomiting, abdominal pain and diarrhea.  Genitourinary: Negative for dysuria and hematuria.  Neurological: Negative for dizziness, sensory change and focal weakness.  All other systems reviewed and are negative.   Tolerating Diet: Dysphagia 2  Tolerating PT: eval noted.   DRUG ALLERGIES:   Allergies  Allergen Reactions  . Prednisone Anaphylaxis    VITALS:  Blood pressure 134/50, pulse 86, temperature 98.4 F (36.9 C), temperature source Oral, resp. rate 24, height '5\' 7"'$  (1.702 m), weight 118.956 kg (262 lb 4 oz), SpO2 97 %.  PHYSICAL EXAMINATION:   Physical Exam   GENERAL:  70 y.o.-year-old obese patient lying in the bed in NAD and follows commands and answers questions appropriately.  EYES: Pupils equal, round, reactive to light, accomodation. (-) scleral icterus.  HEENT: Head atraumatic, normocephalic. Moist Oral Mucosa NECK:  Supple, no jugular venous distention. No thyroid enlargement, no tenderness.  LUNGS: Good air entry bilaterally, no rales or rhonchi or wheezes. No use of accessory muscles. CARDIOVASCULAR: S1, S2 normal. No murmurs, rubs, or gallops.  ABDOMEN: Soft, nontender, nondistended. Bowel sounds present. No organomegaly or mass. EXTREMITIES: Gangrenous toes bilaterally, +2 edema bilaterally.  Blisters on Lower ext b/l covered in bandages.  NEUROLOGIC: Globally weak, follows simple commands.  PSYCHIATRIC: Alert  and awake oriented 3, good affect  SKIN: Skin blisters noted on the lower extremities bilaterally, cyanotic appearing and gangrenous toes bilaterally.   LABORATORY PANEL:  CBC  Recent Labs Lab 11/07/15 0500  WBC 14.9*  HGB 8.8*  HCT 26.5*  PLT 207    Chemistries   Recent Labs Lab 11/03/15 0539  11/08/15 0458  NA 146*  < > 142  K 2.8*  < > 3.3*  CL 114*  < > 110  CO2 29  < > 27  GLUCOSE 255*  < > 124*  BUN 28*  < > 16  CREATININE 1.06*  < > 0.75  CALCIUM 7.5*  < > 7.9*  MG 1.8  < > 1.7  AST 20  --   --   ALT 26  --   --   ALKPHOS 87  --   --   BILITOT 0.7  --   --   < > = values in this interval not displayed. Cardiac Enzymes No results for input(s): TROPONINI in the last 168 hours. RADIOLOGY:  No results found. ASSESSMENT AND PLAN:   70 year old female presented to the emergency room after she was found lying on the floor with minimal responsiveness.  1. Severe Septic shock, hypovolemic shock: due to proteus bacteremia, from UTI - finished 18 days of IV abx therapy and appreciate ID input and no need for further abx presently.  - afebrile, hemodynamically stable.    2. Acute renal failure: - was due to ATN in the setting of sepsis and rhabdomyolysis.  - was on CRRT but it was stopped due to thrombocytopenia.  Had short course of HD 1/28 - Urine  output is good and Cr. Back to baseline.   - pt. 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 She is S/p ureteral stent placement by Dr. Erlene Quan on 11/03/15.  No acute issue presently.   3. Severe leukocytosis: Likely due to underlying sepsis and improving.   4. Atrial fibrillation with RVR - rate controlled now.  Off amio now.  - Echo showing normal LV function w/ EF of 55%.    5. DM-2 , new onset - A17 was 7.7.  Cont. Lantus, SSI and follow BS which are stable so far.    6. Acute rhabdomyolysis secondary to patient  being found down on the floor:  - CPK's have trended down. Resolved.   7. 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 Was + but improving.  - plt count normal now. No acute bleeding. .   8. Hypokalemia -  Appreciate pharmacy help in supplementing potassium and will cont. To monitor.  - check Mg. Level in a.m.    9. Hypernatremia - sodium normalized now and pt. Is off D5W  10.  Gangrenous Toes b/l - this is due to severe hypotension and septic shock from her admission. -Appreciate vascular surgery input. Patient likely will need bilateral transmetatarsal versus bilateral below the knee amputations. Await for further demarcation and this is to be done as an outpatient down the road. -We'll likely need to be an empiric antibiotics and discussed with vascular surgery and will discharge on Augmentin.  Likely d/c to STR next week.    CODE STATUS: Full  DVT Prophylaxis: Lovenox  TOTAL critical TIME TAKING CARE OF THIS PATIENT: 25 minutes.   Note: This dictation was prepared with Dragon dictation along with smaller phrase technology. Any transcriptional errors that result from this process are unintentional.  Henreitta Leber M.D on 11/08/2015 at 12:52 PM  Between 7am to 6pm - Pager - [phone]  After 6pm go to [email or web address] - password [email or web address] [place] [facility]  Office  [phone]  CC: Primary care physician; Albina Billet, MD

## 2015-11-09 LAB — BASIC METABOLIC PANEL
Anion gap: 3 — ABNORMAL LOW (ref 5–15)
BUN: 14 mg/dL (ref 6–20)
CALCIUM: 7.8 mg/dL — AB (ref 8.9–10.3)
CO2: 28 mmol/L (ref 22–32)
CREATININE: 0.73 mg/dL (ref 0.44–1.00)
Chloride: 109 mmol/L (ref 101–111)
GFR calc non Af Amer: >60 mL/min (ref >60)
Glucose, Bld: 154 mg/dL — ABNORMAL HIGH (ref 65–99)
Potassium: 3.3 mmol/L — ABNORMAL LOW (ref 3.5–5.1)
SODIUM: 140 mmol/L (ref 135–145)

## 2015-11-09 LAB — GLUCOSE, CAPILLARY
GLUCOSE-CAPILLARY: 123 mg/dL — AB (ref 65–99)
Glucose-Capillary: 142 mg/dL — ABNORMAL HIGH (ref 65–99)

## 2015-11-09 LAB — PHOSPHORUS: PHOSPHORUS: 3.5 mg/dL (ref 2.5–4.6)

## 2015-11-09 LAB — MAGNESIUM: MAGNESIUM: 1.9 mg/dL (ref 1.7–2.4)

## 2015-11-09 MED ORDER — INSULIN ASPART 100 UNIT/ML ~~LOC~~ SOLN: 0.0000 [IU] | Freq: Every day | SUBCUTANEOUS | Status: DC

## 2015-11-09 MED ORDER — DOXYCYCLINE MONOHYDRATE 100 MG PO TABS: 100.0000 mg | ORAL_TABLET | Freq: Two times a day (BID) | ORAL | Status: AC

## 2015-11-09 MED ORDER — INSULIN ASPART 100 UNIT/ML ~~LOC~~ SOLN: 0.0000 [IU] | Freq: Three times a day (TID) | SUBCUTANEOUS | Status: DC

## 2015-11-09 MED ORDER — POTASSIUM CHLORIDE ER 10 MEQ PO TBCR: 10.0000 meq | EXTENDED_RELEASE_TABLET | Freq: Every day | ORAL | Status: DC

## 2015-11-09 MED ORDER — BISACODYL 10 MG RE SUPP: 10.0000 mg | Freq: Every day | RECTAL | Status: DC | PRN

## 2015-11-09 MED ORDER — FUROSEMIDE 20 MG PO TABS: 40.0000 mg | ORAL_TABLET | Freq: Every day | ORAL | Status: DC

## 2015-11-09 MED ORDER — IPRATROPIUM-ALBUTEROL 0.5-2.5 (3) MG/3ML IN SOLN: 3.0000 mL | Freq: Four times a day (QID) | RESPIRATORY_TRACT | Status: DC | PRN

## 2015-11-09 MED ORDER — POTASSIUM CHLORIDE 20 MEQ PO PACK: 40.0000 meq | PACK | Freq: Once | ORAL | Status: DC

## 2015-11-09 MED ORDER — METOPROLOL TARTRATE 25 MG PO TABS: 25.0000 mg | ORAL_TABLET | Freq: Two times a day (BID) | ORAL | Status: DC

## 2015-11-09 MED ORDER — INSULIN GLARGINE 100 UNIT/ML ~~LOC~~ SOLN: 15.0000 [IU] | Freq: Every day | SUBCUTANEOUS | Status: DC

## 2015-11-09 NOTE — Progress Notes (Signed)
[place] for Electrolyte Management Indication: hypokalemia    Allergies  Allergen Reactions  . Prednisone Anaphylaxis    Patient Measurements: Height: '5\' 7"'$  (170.2 cm) Weight: 263 lb 6.4 oz (119.477 kg) IBW/kg (Calculated) : 61.6   Vital Signs: Temp: 98.6 F (37 C) (02/13 0810) Temp Source: Oral (02/13 0810) BP: 159/63 mmHg (02/13 0810) Pulse Rate: 82 (02/13 0810) Intake/Output from previous day: 02/12 0701 - 02/13 0700 In: 970 [P.O.:970] Out: -  Intake/Output from this shift:   Vent settings for last 24 hours:    Labs:  Recent Labs  11/07/15 0500 11/08/15 0458 11/09/15 0506  WBC 14.9*  --   --   HGB 8.8*  --   --   HCT 26.5*  --   --   PLT 207  --   --   CREATININE 0.86 0.75 0.73  MG 1.6* 1.7 1.9  PHOS  --   --  3.5   Estimated Creatinine Clearance: 88.8 mL/min (by C-G formula based on Cr of 0.73).    Lab Results  Component Value Date   K 3.3* 11/09/2015   Mag= 1.6   11/07/15   Assessment: 70 y/o F with ARF recently transitioned from CRRT to HD not currently requiring HD.  2/13: Scr 0.73- renal fxn has normalized  2/8: Potassium low at 2.9 this AM and replaced with 4 runs of KCl 10 meq and 40 meq KCl po. Will recheck potasium at 1800. --> only got 3 runs, no PO  2/8 PM K+ 3.3. KCl 10 mEq IV x 2 doses ordered. BMP scheduled in AM.  2/9 K 3.6, Mg 1.8 2/10: K: 3.3  2/11: K 3.3  Mag 1.6.  Magnesium sulfate 2 gram IV x1. KCl 40 mEq PO x2.   2/12: K=3.3  Mag=1.7.  Will give Magnesium 4 gram IV x1. KCL PO 40 meq daily.   2/13: K 3.3, Mag 1.9, Phos 3.5 - WNL except low K   Plan:  Current orders for KCl 40 mEq PO daily at 1000.   Will add another KCl 40 mEq PO x1 at 1400 BMET in AM to f/u K  Pharmacy will continue to monitor and adjust per consult.     Rayna Sexton, PharmD, BCPS Clinical Pharmacist 11/09/2015 8:46 AM

## 2015-11-09 NOTE — Discharge Summary (Signed)
[facility] [place] NAME: April [name]    MR#:  [id number]  DATE OF BIRTH:  Mar 09, 1946  DATE OF ADMISSION:  10/20/2015 ADMITTING PHYSICIAN: Fritzi Mandes, MD  DATE OF DISCHARGE: 11/09/2015  PRIMARY CARE PHYSICIAN: Albina Billet, MD    ADMISSION DIAGNOSIS:  NSTEMI (non-ST elevated myocardial infarction) ([place]) [I21.4] Sepsis due to urinary tract infection ([place]) [A41.9, N39.0] Acute renal failure, unspecified acute renal failure type ([place]) [N17.9] Altered mental status, unspecified altered mental status type [R41.82]  DISCHARGE DIAGNOSIS:  Active Problems:   Sepsis ([place])   Acute renal failure (HCC)   Altered mental status   NSTEMI (non-ST elevated myocardial infarction) ([place])   Sepsis due to urinary tract infection ([place])   Acute renal failure (ARF) (HCC)   Hydronephrosis   Endotracheally intubated   Respiratory distress   Arterial hypotension   Respiratory failure (HCC)   Proteus septicemia ([place])   SECONDARY DIAGNOSIS:  History reviewed. No pertinent past medical history.  HOSPITAL COURSE:   70 year old female presented to the emergency room after she was found lying on the floor unresponsive and presented to the hospital in septic shock.   1. Severe Septic shock-patient presented to the hospital in severe sepsis with hypovolemic and septic shock. Patient was admitted to the intensive care unit and started on aggressive therapy with IV vasopressors, IV fluids and also broad-spectrum IV antibiotics with vancomycin and Zosyn. -The source of patient's sepsis was a Proteus urinary tract infection with septicemia and bacteremia. -An infectious disease consult was obtained and once the patient's blood cultures were known patient was narrowed down to IV ceftriaxone. Patient received a total of 18 days of IV antibiotic therapy and is currently off antibiotics and is clinically doing well. She is afebrile and hemodynamically  stable. -Patient was also noted to have sputum culture positive for Enterobacter but this has also been adequately treated with IV antibiotics as mentioned above.  2. Acute renal failure-this was a result of patient's severe sepsis and hypotension. Patient developed acute renal necrosis in the setting of sepsis and rhabdomyolysis. Given her hemodynamic instability and her worsening creatinine a nephrology consult was obtained. Patient was started on continuous hemodialysis initially. The CRRT was stopped as patient developed thrombocytopenia which was thought to be the result of the heparin being used during CRRT. -Patient was transitioned off CRRT and then given a short course of hemodialysis. -Patient has not been extubated and is clinically doing well and her renal function is back to baseline. She is also producing good urine and nephrology has signed off the case presently.    3. Right-sided hydronephrosis-this was noted on the CT scan of the abdomen and pelvis on admission with right-sided UPJ obstruction. Since the source of her sepsis was urinary a repeat CT scan was done which showed similar hydronephrosis. Urology consult was obtained and patient underwent a ureteral stent to the right on 11/03/2015. She is clinically stable from that standpoint with a recent renal ultrasound showing resolving hydronephrosis.   4. Severe leukocytosis: Patient had significant leukocytosis as a result of her sepsis. -This has since then improved and resolved now.  5. Atrial fibrillation with RVR - patient developed atrial fibrillation with rapid ventricular response is result of her underlying sepsis and multiorgan failure. She was started on amiodarone drip and then weaned off of it to oral amiodarone. She is now converted to sinus rhythm and remained stable. -At present she will be discharged on oral beta  blockers. Her echocardiogram showed normal ejection fraction of 55-60%.  6. DM-2-this was new onset  for the patient while in the hospital. Her hemoglobin A1c was noted to be as high as 7.7. -While she was in the ICU she was maintained on a hypoglycemic protocol. Now she is being discharged on Lantus and sliding scale insulin and her blood sugars have remained stable on that.  7. Acute rhabdomyolysis secondary to patient being found down on the floor for a prolonged period of time. -After aggressive therapy patient's CPKs have trended down and this has not resolved.  8. Thrombocytopenia - patient developed low platelet count while she was getting CRRT. The CRRT was stopped. A heparin-induced antibody assay was sent out. It turned out to be positive. Her platelet count has now improved since heparin has been stopped. She has no evidence of acute bleeding presently. - her counts can be further followed as an outpatient.    8. Hypokalemia/Hypernatremia - pt. Had significant electrolyte shifts during her prolonged hospital course and they have now been corrected and stable upon discharge.  - she was on D5W for there hypernatremia but is taking PO well and sodium has normalized.   9. Gangrenous Toes b/l - this was due to severe hypotension and septic shock from her admission. -Appreciate vascular surgery input. Patient likely will need bilateral transmetatarsal versus bilateral below the knee amputations. Vascular surgery wants to wait for further demarcation and this is to be done as an outpatient within the next few weeks.  She is empirically being discharged on Oral Doxycyline and will follow up with Dr. Leotis Pain as an outpatient.   Patient is now being discharged to skilled nursing facility for ongoing care.  DISCHARGE CONDITIONS:   Stable  CONSULTS OBTAINED:  Treatment Team:  Algernon Huxley, MD Catarina Hartshorn, MD Adrian Prows, MD Festus Aloe, MD  DRUG ALLERGIES:   Allergies  Allergen Reactions  . Prednisone Anaphylaxis    DISCHARGE MEDICATIONS:   Current Discharge  Medication List    START taking these medications   Details  bisacodyl (DULCOLAX) 10 MG suppository Place 1 suppository (10 mg total) rectally daily as needed for moderate constipation. Qty: 12 suppository, Refills: 0    doxycycline (ADOXA) 100 MG tablet Take 1 tablet (100 mg total) by mouth 2 (two) times daily.    furosemide (LASIX) 20 MG tablet Take 2 tablets (40 mg total) by mouth daily. Qty: 30 tablet    !! insulin aspart (NOVOLOG) 100 UNIT/ML injection Inject 0-5 Units into the skin at bedtime. Qty: 10 mL, Refills: 11    !! insulin aspart (NOVOLOG) 100 UNIT/ML injection Inject 0-9 Units into the skin 3 (three) times daily with meals. Qty: 10 mL, Refills: 11    insulin glargine (LANTUS) 100 UNIT/ML injection Inject 0.15 mLs (15 Units total) into the skin daily. Qty: 10 mL, Refills: 11    ipratropium-albuterol (DUONEB) 0.5-2.5 (3) MG/3ML SOLN Take 3 mLs by nebulization every 6 (six) hours as needed. Qty: 360 mL    metoprolol tartrate (LOPRESSOR) 25 MG tablet Take 1 tablet (25 mg total) by mouth 2 (two) times daily.    potassium chloride (K-DUR) 10 MEQ tablet Take 1 tablet (10 mEq total) by mouth daily.     !! - Potential duplicate medications found. Please discuss with provider.       DISCHARGE INSTRUCTIONS:   DIET:  Cardiac diet and Diabetic diet Dysphagia 2 with thin liquids and aspiration precautions.   DISCHARGE CONDITION:  Stable  ACTIVITY:  Activity as tolerated  OXYGEN:  Home Oxygen: No.   Oxygen Delivery: room air  DISCHARGE LOCATION:  nursing home   If you experience worsening of your admission symptoms, develop shortness of breath, life threatening emergency, suicidal or homicidal thoughts you must seek medical attention immediately by calling 911 or calling your MD immediately  if symptoms less severe.  You Must read complete instructions/literature along with all the possible adverse reactions/side effects for all the Medicines you take and that  have been prescribed to you. Take any new Medicines after you have completely understood and accpet all the possible adverse reactions/side effects.   Please note  You were cared for by a hospitalist during your hospital stay. If you have any questions about your discharge medications or the care you received while you were in the hospital after you are discharged, you can call the unit and asked to speak with the hospitalist on call if the hospitalist that took care of you is not available. Once you are discharged, your primary care physician will handle any further medical issues. Please note that NO REFILLS for any discharge medications will be authorized once you are discharged, as it is imperative that you return to your primary care physician (or establish a relationship with a primary care physician if you do not have one) for your aftercare needs so that they can reassess your need for medications and monitor your lab values.     Today   She is sitting up in bed in no apparent distress. No complaints presently. No chest pain, abdominal pain, nausea, vomiting.  VITAL SIGNS:  Blood pressure 159/63, pulse 82, temperature 98.6 F (37 C), temperature source Oral, resp. rate 22, height '5\' 7"'$  (1.702 m), weight 119.477 kg (263 lb 6.4 oz), SpO2 93 %.  I/O:   Intake/Output Summary (Last 24 hours) at 11/09/15 1124 Last data filed at 11/09/15 0933  Gross per 24 hour  Intake    970 ml  Output      0 ml  Net    970 ml    PHYSICAL EXAMINATION:   GENERAL: 70 y.o.-year-old obese patient lying in the bed in NAD and follows commands and answers questions appropriately.  EYES: Pupils equal, round, reactive to light, accomodation. (-) scleral icterus.  HEENT: Head atraumatic, normocephalic. Moist Oral Mucosa NECK: Supple, no jugular venous distention. No thyroid enlargement, no tenderness.  LUNGS: Good air entry bilaterally, no rales or rhonchi or wheezes. No use of accessory  muscles. CARDIOVASCULAR: S1, S2 normal. No murmurs, rubs, or gallops.  ABDOMEN: Soft, nontender, nondistended. Bowel sounds present. No organomegaly or mass. EXTREMITIES: Gangrenous toes bilaterally, +2 edema bilaterally. Blisters on Lower ext b/l covered in bandages.  NEUROLOGIC: Globally weak, follows simple commands.  PSYCHIATRIC: Alert and awake oriented 3, good affect  SKIN: Skin blisters noted on the lower extremities bilaterally, cyanotic appearing and gangrenous toes bilaterally.   DATA REVIEW:   CBC  Recent Labs Lab 11/07/15 0500  WBC 14.9*  HGB 8.8*  HCT 26.5*  PLT 207    Chemistries   Recent Labs Lab 11/03/15 0539  11/09/15 0506  NA 146*  < > 140  K 2.8*  < > 3.3*  CL 114*  < > 109  CO2 29  < > 28  GLUCOSE 255*  < > 154*  BUN 28*  < > 14  CREATININE 1.06*  < > 0.73  CALCIUM 7.5*  < > 7.8*  MG 1.8  < > 1.9  AST 20  --   --   ALT 26  --   --   ALKPHOS 87  --   --   BILITOT 0.7  --   --   < > = values in this interval not displayed.  Cardiac Enzymes No results for input(s): TROPONINI in the last 168 hours.  Microbiology Results  Results for orders placed or performed during the hospital encounter of 10/20/15  Urine culture     Status: None   Collection Time: 10/20/15  9:57 AM  Result Value Ref Range Status   Specimen Description URINE, RANDOM  Final   Special Requests NONE  Final   Culture >=100,000 COLONIES/mL PROTEUS MIRABILIS  Final   Report Status 10/22/2015 FINAL  Final   Organism ID, Bacteria PROTEUS MIRABILIS  Final      Susceptibility   Proteus mirabilis - MIC*    AMPICILLIN <=2 SENSITIVE Sensitive     GENTAMICIN <=1 SENSITIVE Sensitive     CEFTRIAXONE Value in next row Sensitive      SENSITIVE<=1    CIPROFLOXACIN Value in next row Sensitive      SENSITIVE<=0.25    IMIPENEM Value in next row Sensitive      SENSITIVE2    NITROFURANTOIN Value in next row Resistant      RESISTANT128    TRIMETH/SULFA Value in next row Sensitive       SENSITIVE<=20    PIP/TAZO Value in next row Sensitive      SENSITIVE<=4    AMPICILLIN/SULBACTAM Value in next row Sensitive      SENSITIVE<=2    * >=100,000 COLONIES/mL PROTEUS MIRABILIS  Culture, blood (routine x 2)     Status: None   Collection Time: 10/20/15  9:57 AM  Result Value Ref Range Status   Specimen Description BLOOD RIGHT WRIST  Final   Special Requests   Final    BOTTLES DRAWN AEROBIC AND ANAEROBIC ANA 1ML AER 4ML   Culture  Setup Time   Final    GRAM NEGATIVE RODS IN BOTH AEROBIC AND ANAEROBIC BOTTLES CRITICAL RESULT CALLED TO, READ BACK BY AND VERIFIED WITH: MATT MCBANE ON 10/21/15 AT 0005 [facility] CONFIRMED BY [place]    Culture   Final    PROTEUS MIRABILIS IN BOTH AEROBIC AND ANAEROBIC BOTTLES    Report Status 10/22/2015 FINAL  Final   Organism ID, Bacteria PROTEUS MIRABILIS  Final      Susceptibility   Proteus mirabilis - MIC*    AMPICILLIN/SULBACTAM Value in next row Sensitive      SENSITIVE<=2    PIP/TAZO Value in next row Sensitive      SENSITIVE<=4    CEFTRIAXONE Value in next row Sensitive      SENSITIVE<=1    IMIPENEM Value in next row Sensitive      SENSITIVE2    GENTAMICIN Value in next row Sensitive      SENSITIVE<=1    CIPROFLOXACIN Value in next row Sensitive      SENSITIVE<=0.25    * PROTEUS MIRABILIS  Culture, blood (routine x 2)     Status: None   Collection Time: 10/20/15  9:57 AM  Result Value Ref Range Status   Specimen Description BLOOD LEFT HAND  Final   Special Requests   Final    BOTTLES DRAWN AEROBIC AND ANAEROBIC AER 3ML ANA 2ML   Culture  Setup Time   Final    GRAM NEGATIVE RODS IN BOTH AEROBIC AND ANAEROBIC BOTTLES CRITICAL VALUE NOTED.  VALUE IS  CONSISTENT WITH PREVIOUSLY REPORTED AND CALLED VALUE.    Culture   Final    PROTEUS MIRABILIS IN BOTH AEROBIC AND ANAEROBIC BOTTLES    Report Status 10/22/2015 FINAL  Final   Organism ID, Bacteria PROTEUS MIRABILIS  Final      Susceptibility   Proteus mirabilis - MIC*    AMPICILLIN  Value in next row Sensitive      SENSITIVE<=2    PIP/TAZO Value in next row Sensitive      SENSITIVE<=4    CEFTAZIDIME Value in next row Sensitive      SENSITIVE<=1    CEFTRIAXONE Value in next row Sensitive      SENSITIVE<=1    CEFEPIME Value in next row Sensitive      SENSITIVE<=1    IMIPENEM Value in next row Sensitive      SENSITIVE2    GENTAMICIN Value in next row Sensitive      SENSITIVE<=1    CIPROFLOXACIN Value in next row Sensitive      SENSITIVE<=0.25    * PROTEUS MIRABILIS  Blood Culture ID Panel (Reflexed)     Status: Abnormal   Collection Time: 10/20/15  9:57 AM  Result Value Ref Range Status   Enterococcus species NOT DETECTED NOT DETECTED Final   Listeria monocytogenes NOT DETECTED NOT DETECTED Final   Staphylococcus species NOT DETECTED NOT DETECTED Final   Staphylococcus aureus NOT DETECTED NOT DETECTED Final   Streptococcus species NOT DETECTED NOT DETECTED Final   Streptococcus agalactiae NOT DETECTED NOT DETECTED Final   Streptococcus pneumoniae NOT DETECTED NOT DETECTED Final   Streptococcus pyogenes NOT DETECTED NOT DETECTED Final   Acinetobacter baumannii NOT DETECTED NOT DETECTED Final   Enterobacteriaceae species DETECTED (A) NOT DETECTED Final    Comment: CRITICAL RESULT CALLED TO, READ BACK BY AND VERIFIED WITH: MATT MCBANE ON 10/21/15 AT 0005 [place]    Enterobacter cloacae complex NOT DETECTED NOT DETECTED Final   Escherichia coli NOT DETECTED NOT DETECTED Final   Klebsiella oxytoca NOT DETECTED NOT DETECTED Final   Klebsiella pneumoniae NOT DETECTED NOT DETECTED Final   Proteus species (A) NOT DETECTED Final    CRITICAL RESULT CALLED TO, READ BACK BY AND VERIFIED WITH:    Comment: MATT MCBANE ON 10/21/15 AT 0005 [place]   Serratia marcescens NOT DETECTED NOT DETECTED Final   Haemophilus influenzae NOT DETECTED NOT DETECTED Final   Neisseria meningitidis NOT DETECTED NOT DETECTED Final   Pseudomonas aeruginosa NOT DETECTED NOT DETECTED Final   Candida  albicans NOT DETECTED NOT DETECTED Final   Candida glabrata NOT DETECTED NOT DETECTED Final   Candida krusei NOT DETECTED NOT DETECTED Final   Candida parapsilosis NOT DETECTED NOT DETECTED Final   Candida tropicalis NOT DETECTED NOT DETECTED Final   Carbapenem resistance NOT DETECTED NOT DETECTED Final   Methicillin resistance NOT DETECTED NOT DETECTED Final   Vancomycin resistance NOT DETECTED NOT DETECTED Final  MRSA PCR Screening     Status: None   Collection Time: 10/20/15  7:41 PM  Result Value Ref Range Status   MRSA by PCR NEGATIVE NEGATIVE Final    Comment:        The GeneXpert MRSA Assay (FDA approved for NASAL specimens only), is one component of a comprehensive MRSA colonization surveillance program. It is not intended to diagnose MRSA infection nor to guide or monitor treatment for MRSA infections.   CULTURE, BLOOD (ROUTINE X 2) w Reflex to PCR ID Panel     Status: None   Collection  Time: 10/22/15 11:39 AM  Result Value Ref Range Status   Specimen Description BLOOD LEFT HAND  Final   Special Requests BOTTLES DRAWN AEROBIC AND ANAEROBIC 1CC  Final   Culture NO GROWTH 5 DAYS  Final   Report Status 10/27/2015 FINAL  Final  Culture, expectorated sputum-assessment     Status: None   Collection Time: 10/22/15 12:00 PM  Result Value Ref Range Status   Specimen Description SPUTUM  Final   Special Requests Normal  Final   Sputum evaluation THIS SPECIMEN IS ACCEPTABLE FOR SPUTUM CULTURE  Final   Report Status 10/22/2015 FINAL  Final  Culture, respiratory (NON-Expectorated)     Status: None   Collection Time: 10/22/15 12:00 PM  Result Value Ref Range Status   Specimen Description SPUTUM  Final   Special Requests Normal Reflexed from [id number]  Final   Gram Stain   Final    FAIR SPECIMEN - 70-80% WBCS FEW WBC SEEN RARE YEAST RARE GRAM NEGATIVE RODS    Culture LIGHT GROWTH ENTEROBACTER AEROGENES  Final   Report Status 10/24/2015 FINAL  Final   Organism ID, Bacteria  ENTEROBACTER AEROGENES  Final      Susceptibility   Enterobacter aerogenes - MIC*    CEFAZOLIN >=64 RESISTANT Resistant     CEFEPIME <=1 SENSITIVE Sensitive     CEFTAZIDIME <=1 SENSITIVE Sensitive     CEFTRIAXONE <=1 SENSITIVE Sensitive     CIPROFLOXACIN <=0.25 SENSITIVE Sensitive     GENTAMICIN <=1 SENSITIVE Sensitive     IMIPENEM 2 SENSITIVE Sensitive     TRIMETH/SULFA <=20 SENSITIVE Sensitive     PIP/TAZO <=4 SENSITIVE Sensitive     * LIGHT GROWTH ENTEROBACTER AEROGENES  Urine culture     Status: None   Collection Time: 10/22/15 12:45 PM  Result Value Ref Range Status   Specimen Description URINE, RANDOM  Final   Special Requests NONE  Final   Culture NO GROWTH 2 DAYS  Final   Report Status 10/24/2015 FINAL  Final  Urine culture     Status: None   Collection Time: 10/28/15  2:36 PM  Result Value Ref Range Status   Specimen Description URINE, RANDOM  Final   Special Requests NONE  Final   Culture NO GROWTH 2 DAYS  Final   Report Status 10/30/2015 FINAL  Final    RADIOLOGY:  No results found.    Management plans discussed with the patient, family and they are in agreement.  CODE STATUS:     Code Status Orders        Start     Ordered   10/31/15 2214  Full code   Continuous     10/31/15 2213    Code Status History    Date Active Date Inactive Code Status Order ID Comments User Context   10/30/2015  3:10 PM 10/31/2015 10:13 PM DNR [id number]  Colleen Can, MD Inpatient   10/30/2015  2:18 PM 10/30/2015  3:10 PM DNR [id number]  Colleen Can, MD Inpatient   10/20/2015  5:14 PM 10/30/2015  2:18 PM Full Code [id number]  Fritzi Mandes, MD Inpatient      TOTAL TIME TAKING CARE OF THIS PATIENT: 45 minutes.    Henreitta Leber M.D on 11/09/2015 at 11:24 AM  Between 7am to 6pm - Pager - [phone]  After 6pm go to [email or web address] - password [email or web address] [facility] [facility]  Office  [phone]  CC: Primary care physician; Albina Billet, MD

## 2015-11-09 NOTE — Care Management Important Message (Signed)
Important Message  Patient Details  Name: April [name] MRN: [id number] Date of Birth: 04-Sep-1946   Medicare Important Message Given:  Yes    Juliann Pulse A Dante Roudebush 11/09/2015, 11:14 AM

## 2015-11-09 NOTE — Clinical Social Work Note (Signed)
Patient to discharge today to [facility]. CSW has spoken to patient and to patient's cousin, [place]. They are in agreement with transfer today. Nurse to call report. Discharge information sent to [facility]. Patient to transport via EMS. Shela Leff MSW,LCSW [phone]

## 2015-11-09 NOTE — Progress Notes (Signed)
11/09/2015 2:48 PM  BP 148/61 mmHg  Pulse 85  Temp(Src) 97.9 F (36.6 C) (Oral)  Resp 24  Ht '5\' 7"'$  (1.702 m)  Wt 119.477 kg (263 lb 6.4 oz)  BMI 41.24 kg/m2  SpO2 95% Patient discharged to [place] per MD orders. Report called to Janett Billow, RN at facility.  PICC removed per policy, pressure applied, dressing dry and intact.  Belongings sent with patient. Discharged via stretcher escorted by EMS.  Almedia Balls, RN

## 2015-11-09 NOTE — Clinical Social Work Placement (Signed)
   CLINICAL SOCIAL WORK PLACEMENT  NOTE  Date:  11/09/2015  Patient Details  Name: April [name] MRN: [id number] Date of Birth: [birth year]  Clinical Social Work is seeking post-discharge placement for this patient at the [place] level of care (*CSW will initial, date and re-position this form in  chart as items are completed):  Yes   Patient/family provided with [place] Work Department's list of facilities offering this level of care within the geographic area requested by the patient (or if unable, by the patient's family).  Yes   Patient/family informed of their freedom to choose among providers that offer the needed level of care, that participate in Medicare, Medicaid or managed care program needed by the patient, have an available bed and are willing to accept the patient.  Yes   Patient/family informed of [facility]'s ownership interest in [facility] and [facility], as well as of the fact that they are under no obligation to receive care at these facilities.  PASRR submitted to EDS on 11/05/15     PASRR number received on 11/05/15     Existing PASRR number confirmed on       FL2 transmitted to all facilities in geographic area requested by pt/family on 11/05/15     FL2 transmitted to all facilities within larger geographic area on       Patient informed that his/her managed care company has contracts with or will negotiate with certain facilities, including the following:        Yes   Patient/family informed of bed offers received.  Patient chooses bed at  [facility])     Physician recommends and patient chooses bed at  [facility])    Patient to be transferred to  [facility]) on 11/09/15.  Patient to be transferred to facility by  (EMS)     Patient family notified on 11/09/15 of transfer.  Name of family member notified:  [facility]     PHYSICIAN       Additional Comment:    _______________________________________________ Shela Leff, LCSW 11/09/2015, 12:16 PM

## 2015-12-03 ENCOUNTER — Inpatient Hospital Stay
Admission: EM | Admit: 2015-12-03 | Discharge: 2015-12-10 | DRG: 813 | Disposition: A | Discharge status: Skilled Nursing Facility | Payer: Medicare HMO | Source: Skilled Nursing Facility | Attending: Internal Medicine | Admitting: Internal Medicine

## 2015-12-03 ENCOUNTER — Encounter: Payer: Self-pay | Admitting: Emergency Medicine

## 2015-12-03 DIAGNOSIS — I252 Old myocardial infarction: Secondary | ICD-10-CM

## 2015-12-03 DIAGNOSIS — D696 Thrombocytopenia, unspecified: Secondary | ICD-10-CM | POA: Diagnosis not present

## 2015-12-03 DIAGNOSIS — Z6835 Body mass index (BMI) 35.0-35.9, adult: Secondary | ICD-10-CM | POA: Diagnosis not present

## 2015-12-03 DIAGNOSIS — N3001 Acute cystitis with hematuria: Secondary | ICD-10-CM | POA: Diagnosis present

## 2015-12-03 DIAGNOSIS — D693 Immune thrombocytopenic purpura: Secondary | ICD-10-CM | POA: Diagnosis present

## 2015-12-03 DIAGNOSIS — D649 Anemia, unspecified: Secondary | ICD-10-CM | POA: Diagnosis present

## 2015-12-03 DIAGNOSIS — I10 Essential (primary) hypertension: Secondary | ICD-10-CM | POA: Diagnosis present

## 2015-12-03 DIAGNOSIS — N938 Other specified abnormal uterine and vaginal bleeding: Secondary | ICD-10-CM | POA: Diagnosis not present

## 2015-12-03 DIAGNOSIS — X58XXXA Exposure to other specified factors, initial encounter: Secondary | ICD-10-CM | POA: Diagnosis present

## 2015-12-03 DIAGNOSIS — A4159 Other Gram-negative sepsis: Secondary | ICD-10-CM | POA: Diagnosis present

## 2015-12-03 DIAGNOSIS — N179 Acute kidney failure, unspecified: Secondary | ICD-10-CM | POA: Diagnosis not present

## 2015-12-03 DIAGNOSIS — D72829 Elevated white blood cell count, unspecified: Secondary | ICD-10-CM | POA: Diagnosis present

## 2015-12-03 DIAGNOSIS — M25559 Pain in unspecified hip: Secondary | ICD-10-CM | POA: Diagnosis not present

## 2015-12-03 DIAGNOSIS — R7881 Bacteremia: Secondary | ICD-10-CM | POA: Diagnosis not present

## 2015-12-03 DIAGNOSIS — N135 Crossing vessel and stricture of ureter without hydronephrosis: Secondary | ICD-10-CM | POA: Diagnosis not present

## 2015-12-03 DIAGNOSIS — E119 Type 2 diabetes mellitus without complications: Secondary | ICD-10-CM | POA: Diagnosis not present

## 2015-12-03 DIAGNOSIS — Z7401 Bed confinement status: Secondary | ICD-10-CM | POA: Diagnosis not present

## 2015-12-03 DIAGNOSIS — E669 Obesity, unspecified: Secondary | ICD-10-CM | POA: Diagnosis present

## 2015-12-03 DIAGNOSIS — Y929 Unspecified place or not applicable: Secondary | ICD-10-CM | POA: Diagnosis not present

## 2015-12-03 DIAGNOSIS — B964 Proteus (mirabilis) (morganii) as the cause of diseases classified elsewhere: Secondary | ICD-10-CM | POA: Diagnosis present

## 2015-12-03 DIAGNOSIS — M549 Dorsalgia, unspecified: Secondary | ICD-10-CM | POA: Diagnosis present

## 2015-12-03 DIAGNOSIS — E1152 Type 2 diabetes mellitus with diabetic peripheral angiopathy with gangrene: Secondary | ICD-10-CM | POA: Diagnosis present

## 2015-12-03 DIAGNOSIS — K5900 Constipation, unspecified: Secondary | ICD-10-CM | POA: Diagnosis not present

## 2015-12-03 DIAGNOSIS — Z794 Long term (current) use of insulin: Secondary | ICD-10-CM

## 2015-12-03 DIAGNOSIS — I480 Paroxysmal atrial fibrillation: Secondary | ICD-10-CM | POA: Diagnosis present

## 2015-12-03 DIAGNOSIS — I2510 Atherosclerotic heart disease of native coronary artery without angina pectoris: Secondary | ICD-10-CM | POA: Diagnosis present

## 2015-12-03 DIAGNOSIS — Z8701 Personal history of pneumonia (recurrent): Secondary | ICD-10-CM | POA: Diagnosis not present

## 2015-12-03 HISTORY — DX: Type 2 diabetes mellitus without complications: E11.9

## 2015-12-03 HISTORY — DX: Disorder of kidney and ureter, unspecified: N28.9

## 2015-12-03 HISTORY — DX: Acute kidney failure, unspecified: N17.9

## 2015-12-03 HISTORY — DX: Non-ST elevation (NSTEMI) myocardial infarction: I21.4

## 2015-12-03 HISTORY — DX: Paroxysmal atrial fibrillation: I48.0

## 2015-12-03 LAB — BASIC METABOLIC PANEL
Anion gap: 5 (ref 5–15)
BUN: 26 mg/dL — AB (ref 6–20)
CALCIUM: 8.6 mg/dL — AB (ref 8.9–10.3)
CO2: 33 mmol/L — AB (ref 22–32)
CREATININE: 0.88 mg/dL (ref 0.44–1.00)
Chloride: 95 mmol/L — ABNORMAL LOW (ref 101–111)
GFR calc non Af Amer: >60 mL/min (ref >60)
GLUCOSE: 102 mg/dL — AB (ref 65–99)
Potassium: 3.8 mmol/L (ref 3.5–5.1)
Sodium: 133 mmol/L — ABNORMAL LOW (ref 135–145)

## 2015-12-03 LAB — CBC WITH DIFFERENTIAL/PLATELET
Basophils Absolute: 0.1 10*3/uL (ref 0–0.1)
Eosinophils Absolute: 0.3 10*3/uL (ref 0–0.7)
Eosinophils Relative: 2 %
HEMATOCRIT: 31.6 % — AB (ref 35.0–47.0)
Hemoglobin: 10.5 g/dL — ABNORMAL LOW (ref 12.0–16.0)
Lymphocytes Relative: 10 %
Lymphs Abs: 1.6 10*3/uL (ref 1.0–3.6)
MCH: 29.9 pg (ref 26.0–34.0)
MCHC: 33.1 g/dL (ref 32.0–36.0)
MCV: 90.4 fL (ref 80.0–100.0)
MONO ABS: 1.1 10*3/uL — AB (ref 0.2–0.9)
NEUTROS ABS: 13.6 10*3/uL — AB (ref 1.4–6.5)
Neutrophils Relative %: 81 %
Platelets: 5 10*3/uL — CL (ref 150–440)
RBC: 3.5 MIL/uL — ABNORMAL LOW (ref 3.80–5.20)
RDW: 15.4 % — AB (ref 11.5–14.5)
WBC: 16.7 10*3/uL — ABNORMAL HIGH (ref 3.6–11.0)

## 2015-12-03 LAB — PROTIME-INR
INR: 1.14
Prothrombin Time: 14.8 seconds (ref 11.4–15.0)

## 2015-12-03 LAB — APTT: aPTT: 34 seconds (ref 24–36)

## 2015-12-03 MED ORDER — OXYCODONE-ACETAMINOPHEN 5-325 MG PO TABS
1.0000 | ORAL_TABLET | Freq: Once | ORAL | Status: AC
  Administered 2015-12-03: 1 via ORAL
  Filled 2015-12-03: qty 1

## 2015-12-03 MED ORDER — CYCLOBENZAPRINE HCL 10 MG PO TABS
5.0000 mg | ORAL_TABLET | Freq: Once | ORAL | Status: AC
  Administered 2015-12-03: 5 mg via ORAL
  Filled 2015-12-03: qty 1

## 2015-12-03 NOTE — ED Notes (Signed)
Pt arrived via EMS from [facility] for complaints of low platelets. Pt states had labwork today and was told her platelet count is 10. Facility requested evaluation.

## 2015-12-03 NOTE — H&P (Signed)
[facility] [place] NAME: April [name]    MR#:  [id number]  DATE OF BIRTH:  1946-08-11  DATE OF ADMISSION:  12/03/2015  PRIMARY CARE PHYSICIAN: Albina Billet, MD   REQUESTING/REFERRING PHYSICIAN: Archie Balboa, M.D.  CHIEF COMPLAINT:   Chief Complaint  Patient presents with  . low platelets     HISTORY OF PRESENT ILLNESS:  April [name]  is a 70 y.o. female who presents with some spontaneous bleeding. Patient was recently admitted here for a long stay with septic shock and multiorgan failure. She was discharged to the nursing facility. There she was noted over the last 3-4 days to have a couple of prolonged nosebleeds, as well as some bleeding from her lips. She had blood work done and was found to have significantly low platelets. She was sent to the ED for further evaluation, and platelet count here was corroborated to be low at around 5000. Of note, the patient also has a leukocytosis with a blood cell count of 16.7 and somewhat of a bandemia on differential. However, she is not complaining of any significant infectious symptoms and vital signs are stable. She does report having some back pain which she relates to muscular strain and spasm, which she feels may be due to her recent participation in physical therapy.  Hospitals were called for admission for severe thrombocytopenia.  PAST MEDICAL HISTORY:   Past Medical History  Diagnosis Date  . Renal disorder   . NSTEMI (non-ST elevated myocardial infarction) ([place])   . Acute renal failure (ARF) ([place])   . Type 2 diabetes mellitus ([place])     PAST SURGICAL HISTORY:   Past Surgical History  Procedure Laterality Date  . Cystoscopy with stent placement Right 11/03/2015    Procedure: CYSTOSCOPY WITH STENT PLACEMENT;  Surgeon: Hollice Espy, MD;  Location: [facility] ORS;  Service: Urology;  Laterality: Right;  . Tonsillectomy      SOCIAL HISTORY:   Social History  Substance Use Topics  . Smoking  status: Never Smoker   . Smokeless tobacco: Not on file  . Alcohol Use: No    FAMILY HISTORY:   Family History  Problem Relation Age of Onset  . Diabetes Father   . Stroke Maternal Grandmother   . Heart disease Father     DRUG ALLERGIES:   Allergies  Allergen Reactions  . Prednisone Anaphylaxis    MEDICATIONS AT HOME:   Prior to Admission medications   Medication Sig Start Date End Date Taking? Authorizing Provider  acetaminophen (TYLENOL) 500 MG tablet Take 1,000 mg by mouth every 8 (eight) hours as needed for moderate pain.   Yes Historical Provider, MD  bisacodyl (DULCOLAX) 10 MG suppository Place 1 suppository (10 mg total) rectally daily as needed for moderate constipation. 11/09/15  Yes Henreitta Leber, MD  diclofenac sodium (VOLTAREN) 1 % GEL Apply 4 g topically 4 (four) times daily.   Yes Historical Provider, MD  furosemide (LASIX) 20 MG tablet Take 2 tablets (40 mg total) by mouth daily. 11/09/15  Yes Henreitta Leber, MD  insulin glargine (LANTUS) 100 UNIT/ML injection Inject 0.15 mLs (15 Units total) into the skin daily. Patient taking differently: Inject 15 Units into the skin at bedtime.  11/09/15  Yes Henreitta Leber, MD  ipratropium-albuterol (DUONEB) 0.5-2.5 (3) MG/3ML SOLN Take 3 mLs by nebulization every 6 (six) hours as needed. 11/09/15  Yes Henreitta Leber, MD  metoprolol tartrate (LOPRESSOR) 25 MG tablet Take 1 tablet (  25 mg total) by mouth 2 (two) times daily. 11/09/15  Yes Henreitta Leber, MD  potassium chloride (K-DUR) 10 MEQ tablet Take 1 tablet (10 mEq total) by mouth daily. 11/09/15  Yes Henreitta Leber, MD  traZODone (DESYREL) 50 MG tablet Take 50 mg by mouth at bedtime.   Yes Historical Provider, MD    REVIEW OF SYSTEMS:  Review of Systems  Constitutional: Negative for fever, chills, weight loss and malaise/fatigue.  HENT: Negative for ear pain, hearing loss and tinnitus.   Eyes: Negative for blurred vision, double vision, pain and redness.   Respiratory: Negative for cough, hemoptysis and shortness of breath.   Cardiovascular: Negative for chest pain, palpitations, orthopnea and leg swelling.  Gastrointestinal: Negative for nausea, vomiting, abdominal pain, diarrhea and constipation.  Genitourinary: Negative for dysuria, frequency and hematuria.  Musculoskeletal: Positive for back pain. Negative for joint pain and neck pain.  Skin:       No acne, rash, or lesions  Neurological: Negative for dizziness, tremors, focal weakness and weakness.  Endo/Heme/Allergies: Negative for polydipsia. Bruises/bleeds easily.  Psychiatric/Behavioral: Negative for depression. The patient is not nervous/anxious and does not have insomnia.      VITAL SIGNS:   Filed Vitals:   12/03/15 2216 12/03/15 2300 12/03/15 2330 12/04/15 0033  BP: 130/60 108/90 135/67 122/87  Pulse: 85 92 99 100  Temp:      TempSrc:      Resp: 16   16  Height:      Weight:      SpO2: 93% 90% 90% 92%   Wt Readings from Last 3 Encounters:  12/03/15 113.853 kg (251 lb)  11/09/15 119.477 kg (263 lb 6.4 oz)    PHYSICAL EXAMINATION:  Physical Exam  Vitals reviewed. Constitutional: She is oriented to person, place, and time. She appears well-developed and well-nourished. No distress.  HENT:  Head: Normocephalic and atraumatic.  Mouth/Throat: Oropharynx is clear and moist.  Eyes: Conjunctivae and EOM are normal. Pupils are equal, round, and reactive to light. No scleral icterus.  Neck: Normal range of motion. Neck supple. No JVD present. No thyromegaly present.  Cardiovascular: Normal rate, regular rhythm and intact distal pulses.  Exam reveals no gallop and no friction rub.   No murmur heard. Respiratory: Effort normal and breath sounds normal. No respiratory distress. She has no wheezes. She has no rales.  GI: Soft. Bowel sounds are normal. She exhibits no distension. There is no tenderness.  Musculoskeletal: Normal range of motion. She exhibits no edema.  No  arthritis, no gout  Lymphadenopathy:    She has no cervical adenopathy.  Neurological: She is alert and oriented to person, place, and time. No cranial nerve deficit.  No dysarthria, no aphasia  Skin: Skin is warm and dry. No rash noted. No erythema.  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 Minimal scattered ecchymosis.  Psychiatric: She has a normal mood and affect. Her behavior is normal. Judgment and thought content normal.    LABORATORY PANEL:   CBC  Recent Labs Lab 12/03/15 1736  WBC 16.7*  HGB 10.5*  HCT 31.6*  PLT 5*   ------------------------------------------------------------------------------------------------------------------  Chemistries   Recent Labs Lab 12/03/15 1736  NA 133*  K 3.8  CL 95*  CO2 33*  GLUCOSE 102*  BUN 26*  CREATININE 0.88  CALCIUM 8.6*   ------------------------------------------------------------------------------------------------------------------  Cardiac Enzymes No results for input(s): TROPONINI in the last 168 hours. ------------------------------------------------------------------------------------------------------------------  RADIOLOGY:  No results found.  EKG:   Orders placed  or performed during the hospital encounter of 10/20/15  . ED EKG  . ED EKG    IMPRESSION AND PLAN:  Principal Problem:   Thrombocytopenia ([place]) - unclear etiology at this time. 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 The patient's recent hospital stay and infection. However, we will consult hematology for their further recommendations in evaluating and treating this patient. 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  Patient has a listed allergy of anaphylaxis to prednisone. Unclear back story around this listing. Patient states that just prior to her last hospitalization she was given prednisone and some antibiotic in the outpatient setting for URI symptoms.  It was after taking these medicines that she states that she had "anaphylaxis."  However, the circumstances surrounding her admission to the prior hospital were that she was found down in her apartment for an unknown period of time by friends after missing work for 2 days. It is unclear what may cause her unresponsiveness and severe illness. Whether or not she had anaphylaxis from one of the medications, or simply had progression of her severe sepsis is at this time undetermined. However, when chart review and conversation with pharmacy it seems that she received several doses of hydrocortisone after admission to the hospital and tolerated this without any incident. Given these facts it seems unlikely that she has a true allergy to steroids. If she proves not to have an allergic reaction to steroids, it will likely be important to identify which antibiotic she took and potentially review whether or not she may be allergic to that antibiotic.  Active Problems:   Leukocytosis - unclear cause at this time. We will get a chest x-ray, send urine and blood cultures to try and elicit if she has any symptoms of ongoing infection. If these remain negative and her back pain persists significantly, it also might be worthwhile to image her back for any seated infection. If she develops fever or any other SIRS symptoms she will likely need initiation of antibiotics.   Type 2 diabetes mellitus (HCC) - carb modified diet with sliding scale insulin and corresponding glucose checks   Paroxysmal atrial fibrillation (HCC) - continue home rate controlling medications   Back pain - treated symptomatically as muscle strain/spasm for now  All the records are reviewed and case discussed with ED provider. Management plans discussed with the patient and/or family.  DVT PROPHYLAXIS: None as patient's platelet count is 5000 and she has dry gangrene of bilateral lower extremities preventing lower extremity mechanical DVT  prophylaxis.  GI PROPHYLAXIS: None  ADMISSION STATUS: Inpatient  CODE STATUS: Full Code Status History    Date Active Date Inactive Code Status Order ID Comments User Context   10/31/2015 10:13 PM 11/09/2015  6:03 PM Full Code [id number]  Colleen Can, MD Inpatient   10/30/2015  3:10 PM 10/31/2015 10:13 PM DNR [id number]  Colleen Can, MD Inpatient   10/30/2015  2:18 PM 10/30/2015  3:10 PM DNR [id number]  Colleen Can, MD Inpatient   10/20/2015  5:14 PM 10/30/2015  2:18 PM Full Code [id number]  Fritzi Mandes, MD Inpatient     TOTAL TIME TAKING CARE OF THIS PATIENT: 55 minutes.    Kelli Robeck Wheeler 12/04/2015, 12:49 AM  [facility]  Office  [phone]  CC: Primary care physician; Albina Billet, MD

## 2015-12-03 NOTE — ED Provider Notes (Signed)
[facility] Emergency Department Provider Note    ____________________________________________  Time seen: ~1745  I have reviewed the triage vital signs and the nursing notes.   HISTORY  Chief Complaint Low platelet count  History limited by: Not Limited   HPI April [name] is a 70 y.o. female who presents to the emergency department today from [place] because of concerns for low platelet count noted on blood work today. Patient is not sure why she had blood work checked today. She did state however that she was told her platelet count was 10. She has noticed a small amount of recent nosebleeds. She was recently hospitalized for pneumonia and sepsis.The patient denies any recent fevers, chest pain or shortness of breath.    No past medical history on file.  Patient Active Problem List   Diagnosis Date Noted  . Respiratory failure ([place])   . Proteus septicemia ([place])   . Acute renal failure (ARF) ([place])   . Hydronephrosis   . Endotracheally intubated   . Respiratory distress   . Arterial hypotension   . Sepsis ([place]) 10/20/2015  . Acute renal failure ([place])   . Altered mental status   . NSTEMI (non-ST elevated myocardial infarction) ([place])   . Sepsis due to urinary tract infection [facility])     Past Surgical History  Procedure Laterality Date  . Cystoscopy with stent placement Right 11/03/2015    Procedure: CYSTOSCOPY WITH STENT PLACEMENT;  Surgeon: Hollice Espy, MD;  Location: [facility] ORS;  Service: Urology;  Laterality: Right;    Current Outpatient Rx  Name  Route  Sig  Dispense  Refill  . bisacodyl (DULCOLAX) 10 MG suppository   Rectal   Place 1 suppository (10 mg total) rectally daily as needed for moderate constipation.   12 suppository   0   . furosemide (LASIX) 20 MG tablet   Oral   Take 2 tablets (40 mg total) by mouth daily.   30 tablet      . insulin aspart (NOVOLOG) 100 UNIT/ML injection   Subcutaneous   Inject 0-5 Units into  the skin at bedtime.   10 mL   11   . insulin aspart (NOVOLOG) 100 UNIT/ML injection   Subcutaneous   Inject 0-9 Units into the skin 3 (three) times daily with meals.   10 mL   11   . insulin glargine (LANTUS) 100 UNIT/ML injection   Subcutaneous   Inject 0.15 mLs (15 Units total) into the skin daily.   10 mL   11   . ipratropium-albuterol (DUONEB) 0.5-2.5 (3) MG/3ML SOLN   Nebulization   Take 3 mLs by nebulization every 6 (six) hours as needed.   360 mL      . metoprolol tartrate (LOPRESSOR) 25 MG tablet   Oral   Take 1 tablet (25 mg total) by mouth 2 (two) times daily.         . potassium chloride (K-DUR) 10 MEQ tablet   Oral   Take 1 tablet (10 mEq total) by mouth daily.           Allergies Prednisone  No family history on file.  Social History Social History  Substance Use Topics  . Smoking status: Never Smoker   . Smokeless tobacco: Not on file  . Alcohol Use: Not on file  Currently resident of [place] house  Review of Systems  Constitutional: Negative for fever. Cardiovascular: Negative for chest pain. Respiratory: Negative for shortness of breath. Gastrointestinal: Negative for abdominal  pain, vomiting and diarrhea. Neurological: Negative for headaches, focal weakness or numbness.   10-point ROS otherwise negative.  ____________________________________________   PHYSICAL EXAM:  VITAL SIGNS:   97.7 F (36.5 C)  73  18   120/57 mmHg  93 %    Constitutional: Alert and oriented.  Eyes: Conjunctivae are normal. PERRL. Normal extraocular movements. ENT   Head: Normocephalic and atraumatic.   Nose: No congestion/rhinnorhea.   Mouth/Throat: Mucous membranes are moist.   Neck: No stridor. Hematological/Lymphatic/Immunilogical: No cervical lymphadenopathy. Cardiovascular: Normal rate, regular rhythm.  No murmurs, rubs, or gallops. Respiratory: Normal respiratory effort without tachypnea nor retractions. Breath sounds are clear  and equal bilaterally. No wheezes/rales/rhonchi. Gastrointestinal: Soft and nontender. No distention.  Genitourinary: Deferred Musculoskeletal: Normal range of motion in all extremities. No joint effusions.  Bilateral necrotic toes.  Neurologic:  Normal speech and language. No gross focal neurologic deficits are appreciated.  Skin:  Skin is warm. Multiple ecchymosis over extremities.  Psychiatric: Mood and affect are normal. Speech and behavior are normal. Patient exhibits appropriate insight and judgment.  ____________________________________________    LABS (pertinent positives/negatives)  Labs Reviewed  CBC WITH DIFFERENTIAL/PLATELET - Abnormal; Notable for the following:    WBC 16.7 (*)    RBC 3.50 (*)    Hemoglobin 10.5 (*)    HCT 31.6 (*)    RDW 15.4 (*)    Platelets 5 (*)    Neutro Abs 13.6 (*)    Monocytes Absolute 1.1 (*)    All other components within normal limits  BASIC METABOLIC PANEL - Abnormal; Notable for the following:    Sodium 133 (*)    Chloride 95 (*)    CO2 33 (*)    Glucose, Bld 102 (*)    BUN 26 (*)    Calcium 8.6 (*)    All other components within normal limits  CULTURE, BLOOD (ROUTINE X 2)  CULTURE, BLOOD (ROUTINE X 2)  PROTIME-INR  APTT  ANTINUCLEAR ANTIBODIES, IFA  IGG, IGA, IGM     ____________________________________________   EKG  None  ____________________________________________    RADIOLOGY  None   ____________________________________________   PROCEDURES  Procedure(s) performed: None  Critical Care performed: No  ____________________________________________   INITIAL IMPRESSION / ASSESSMENT AND PLAN / ED COURSE  Pertinent labs & imaging results that were available during my care of the patient were reviewed by me and considered in my medical decision making (see chart for details).  Patient presented to the emergency department from living facility today because of concerns for low platelets. On exam patient  did have diffuse ecchymosis. Platelets 5 today. It appears that the patient was quite thrombocytopenic during her previous admission however at that time patient was septic. Patient does not appear to be septic today although she does have a mild elevation of her white blood cell count. Will plan on admission to the hospitalist service for further workup and evaluation.  ____________________________________________   FINAL CLINICAL IMPRESSION(S) / ED DIAGNOSES  Final diagnoses:  Thrombocytopenia ([place])     Nance Pear, MD 12/03/15 2348

## 2015-12-03 NOTE — ED Notes (Signed)
EDT Beverlee Nims and Myra in room assisting pt on bedpan.

## 2015-12-04 ENCOUNTER — Inpatient Hospital Stay: Payer: Medicare HMO

## 2015-12-04 ENCOUNTER — Encounter: Payer: Self-pay | Admitting: Internal Medicine

## 2015-12-04 DIAGNOSIS — I480 Paroxysmal atrial fibrillation: Secondary | ICD-10-CM

## 2015-12-04 DIAGNOSIS — N179 Acute kidney failure, unspecified: Secondary | ICD-10-CM

## 2015-12-04 DIAGNOSIS — Z79899 Other long term (current) drug therapy: Secondary | ICD-10-CM

## 2015-12-04 DIAGNOSIS — M549 Dorsalgia, unspecified: Secondary | ICD-10-CM | POA: Diagnosis present

## 2015-12-04 DIAGNOSIS — E119 Type 2 diabetes mellitus without complications: Secondary | ICD-10-CM

## 2015-12-04 DIAGNOSIS — I252 Old myocardial infarction: Secondary | ICD-10-CM

## 2015-12-04 DIAGNOSIS — D696 Thrombocytopenia, unspecified: Secondary | ICD-10-CM

## 2015-12-04 LAB — BLOOD CULTURE ID PANEL (REFLEXED)
ACINETOBACTER BAUMANNII: NOT DETECTED
CANDIDA GLABRATA: NOT DETECTED
CANDIDA KRUSEI: NOT DETECTED
CANDIDA PARAPSILOSIS: NOT DETECTED
Candida albicans: NOT DETECTED
Candida tropicalis: NOT DETECTED
Carbapenem resistance: NOT DETECTED
ESCHERICHIA COLI: NOT DETECTED
Enterobacter cloacae complex: NOT DETECTED
Enterobacteriaceae species: DETECTED — AB
Enterococcus species: NOT DETECTED
Haemophilus influenzae: NOT DETECTED
KLEBSIELLA OXYTOCA: NOT DETECTED
KLEBSIELLA PNEUMONIAE: NOT DETECTED
LISTERIA MONOCYTOGENES: NOT DETECTED
Methicillin resistance: NOT DETECTED
NEISSERIA MENINGITIDIS: NOT DETECTED
Proteus species: DETECTED — AB
Pseudomonas aeruginosa: NOT DETECTED
SERRATIA MARCESCENS: NOT DETECTED
STREPTOCOCCUS AGALACTIAE: NOT DETECTED
STREPTOCOCCUS SPECIES: NOT DETECTED
Staphylococcus aureus (BCID): NOT DETECTED
Staphylococcus species: NOT DETECTED
Streptococcus pneumoniae: NOT DETECTED
Streptococcus pyogenes: NOT DETECTED
Vancomycin resistance: NOT DETECTED

## 2015-12-04 LAB — URINALYSIS COMPLETE WITH MICROSCOPIC (ARMC ONLY)
BILIRUBIN URINE: NEGATIVE
GLUCOSE, UA: NEGATIVE mg/dL
KETONES UR: NEGATIVE mg/dL
NITRITE: NEGATIVE
PROTEIN: 100 mg/dL — AB
SPECIFIC GRAVITY, URINE: 1.008 (ref 1.005–1.030)
pH: 8 (ref 5.0–8.0)

## 2015-12-04 LAB — CBC
HEMATOCRIT: 29.7 % — AB (ref 35.0–47.0)
HEMOGLOBIN: 10 g/dL — AB (ref 12.0–16.0)
MCH: 29.9 pg (ref 26.0–34.0)
MCHC: 33.6 g/dL (ref 32.0–36.0)
MCV: 89 fL (ref 80.0–100.0)
Platelets: <5 10*3/uL — CL (ref 150–440)
RBC: 3.33 MIL/uL — ABNORMAL LOW (ref 3.80–5.20)
RDW: 15.3 % — AB (ref 11.5–14.5)
WBC: 19.3 10*3/uL — ABNORMAL HIGH (ref 3.6–11.0)

## 2015-12-04 LAB — GLUCOSE, CAPILLARY
GLUCOSE-CAPILLARY: 107 mg/dL — AB (ref 65–99)
GLUCOSE-CAPILLARY: 181 mg/dL — AB (ref 65–99)
GLUCOSE-CAPILLARY: 309 mg/dL — AB (ref 65–99)
GLUCOSE-CAPILLARY: 237 mg/dL — AB (ref 65–99)
GLUCOSE-CAPILLARY: 275 mg/dL — AB (ref 65–99)
Glucose-Capillary: 224 mg/dL — ABNORMAL HIGH (ref 65–99)

## 2015-12-04 LAB — BASIC METABOLIC PANEL
Anion gap: 9 (ref 5–15)
BUN: 23 mg/dL — AB (ref 6–20)
CALCIUM: 8.5 mg/dL — AB (ref 8.9–10.3)
CHLORIDE: 96 mmol/L — AB (ref 101–111)
CO2: 27 mmol/L (ref 22–32)
CREATININE: 0.86 mg/dL (ref 0.44–1.00)
GFR calc non Af Amer: >60 mL/min (ref >60)
GLUCOSE: 171 mg/dL — AB (ref 65–99)
Potassium: 4.3 mmol/L (ref 3.5–5.1)
Sodium: 132 mmol/L — ABNORMAL LOW (ref 135–145)

## 2015-12-04 LAB — ABO/RH: ABO/RH(D): A POS

## 2015-12-04 LAB — MRSA PCR SCREENING: MRSA by PCR: POSITIVE — AB

## 2015-12-04 MED ORDER — VANCOMYCIN HCL IN DEXTROSE 1-5 GM/200ML-% IV SOLN
1000.0000 mg | Freq: Two times a day (BID) | INTRAVENOUS | Status: DC
  Administered 2015-12-04 – 2015-12-06 (×4): 1000 mg via INTRAVENOUS
  Filled 2015-12-04 (×5): qty 200

## 2015-12-04 MED ORDER — DEXTROSE 5 % IV SOLN
2.0000 g | INTRAVENOUS | Status: DC
  Administered 2015-12-04 – 2015-12-09 (×6): 2 g via INTRAVENOUS
  Filled 2015-12-04 (×7): qty 2

## 2015-12-04 MED ORDER — CETYLPYRIDINIUM CHLORIDE 0.05 % MT LIQD
7.0000 mL | Freq: Two times a day (BID) | OROMUCOSAL | Status: DC
  Administered 2015-12-04 – 2015-12-10 (×13): 7 mL via OROMUCOSAL

## 2015-12-04 MED ORDER — IMMUNE GLOBULIN (HUMAN) 10 GM/100ML IV SOLN: 1.0000 g/kg | INTRAVENOUS | Status: DC

## 2015-12-04 MED ORDER — ONDANSETRON HCL 4 MG PO TABS: 4.0000 mg | ORAL_TABLET | Freq: Four times a day (QID) | ORAL | Status: DC | PRN

## 2015-12-04 MED ORDER — BISACODYL 5 MG PO TBEC
10.0000 mg | DELAYED_RELEASE_TABLET | Freq: Every day | ORAL | Status: DC | PRN
  Administered 2015-12-10: 10 mg via ORAL
  Filled 2015-12-04: qty 2

## 2015-12-04 MED ORDER — QUETIAPINE FUMARATE 25 MG PO TABS
25.0000 mg | ORAL_TABLET | Freq: Every day | ORAL | Status: DC
  Administered 2015-12-08 – 2015-12-09 (×2): 25 mg via ORAL
  Filled 2015-12-04 (×5): qty 1

## 2015-12-04 MED ORDER — ACETAMINOPHEN 650 MG RE SUPP: 650.0000 mg | Freq: Four times a day (QID) | RECTAL | Status: DC | PRN

## 2015-12-04 MED ORDER — METHYLPREDNISOLONE SODIUM SUCC 125 MG IJ SOLR
60.0000 mg | Freq: Four times a day (QID) | INTRAMUSCULAR | Status: DC
  Administered 2015-12-04 (×3): 60 mg via INTRAVENOUS
  Filled 2015-12-04 (×3): qty 2

## 2015-12-04 MED ORDER — DEXTROSE 5 % IV SOLN
2.0000 g | Freq: Once | INTRAVENOUS | Status: AC
  Administered 2015-12-04: 2 g via INTRAVENOUS
  Filled 2015-12-04: qty 2

## 2015-12-04 MED ORDER — SODIUM CHLORIDE 0.9% FLUSH
3.0000 mL | Freq: Two times a day (BID) | INTRAVENOUS | Status: DC
  Administered 2015-12-04 – 2015-12-10 (×14): 3 mL via INTRAVENOUS

## 2015-12-04 MED ORDER — CEFTRIAXONE SODIUM 2 G IJ SOLR
2.0000 g | INTRAMUSCULAR | Status: DC
Start: 2015-12-05 — End: 2015-12-04

## 2015-12-04 MED ORDER — OXYCODONE HCL 5 MG PO TABS
5.0000 mg | ORAL_TABLET | Freq: Four times a day (QID) | ORAL | Status: DC | PRN
  Administered 2015-12-05 – 2015-12-10 (×6): 5 mg via ORAL
  Filled 2015-12-04 (×6): qty 1

## 2015-12-04 MED ORDER — MORPHINE SULFATE (PF) 2 MG/ML IV SOLN
2.0000 mg | INTRAVENOUS | Status: DC | PRN
  Administered 2015-12-04 – 2015-12-09 (×10): 2 mg via INTRAVENOUS
  Filled 2015-12-04 (×11): qty 1

## 2015-12-04 MED ORDER — SODIUM CHLORIDE 0.9 % IV SOLN
3.0000 g | Freq: Four times a day (QID) | INTRAVENOUS | Status: DC
  Administered 2015-12-04 (×3): 3 g via INTRAVENOUS
  Filled 2015-12-04 (×6): qty 3

## 2015-12-04 MED ORDER — DEXAMETHASONE 4 MG PO TABS
20.0000 mg | ORAL_TABLET | Freq: Every day | ORAL | Status: DC
  Administered 2015-12-04 – 2015-12-06 (×3): 20 mg via ORAL
  Filled 2015-12-04 (×3): qty 5

## 2015-12-04 MED ORDER — SODIUM CHLORIDE 0.9 % IV SOLN
Freq: Once | INTRAVENOUS | Status: AC
Start: 2015-12-04 — End: 2015-12-04
  Administered 2015-12-04: 09:00:00 via INTRAVENOUS

## 2015-12-04 MED ORDER — CHLORHEXIDINE GLUCONATE CLOTH 2 % EX PADS
6.0000 | MEDICATED_PAD | Freq: Every day | CUTANEOUS | Status: AC
  Administered 2015-12-06 – 2015-12-08 (×3): 6 via TOPICAL

## 2015-12-04 MED ORDER — VANCOMYCIN HCL IN DEXTROSE 1-5 GM/200ML-% IV SOLN
1000.0000 mg | Freq: Once | INTRAVENOUS | Status: AC
Start: 2015-12-04 — End: 2015-12-04
  Administered 2015-12-04: 1000 mg via INTRAVENOUS
  Filled 2015-12-04: qty 200

## 2015-12-04 MED ORDER — MUPIROCIN 2 % EX OINT
1.0000 "application " | TOPICAL_OINTMENT | Freq: Two times a day (BID) | CUTANEOUS | Status: AC
  Administered 2015-12-04 – 2015-12-08 (×10): 1 via NASAL
  Filled 2015-12-04: qty 22

## 2015-12-04 MED ORDER — IMMUNE GLOBULIN (HUMAN) 10 GM/100ML IV SOLN
1.0000 g/kg | INTRAVENOUS | Status: AC
  Administered 2015-12-04 – 2015-12-05 (×2): 105 g via INTRAVENOUS
  Filled 2015-12-04 (×2): qty 1050

## 2015-12-04 MED ORDER — METOPROLOL TARTRATE 25 MG PO TABS
25.0000 mg | ORAL_TABLET | Freq: Two times a day (BID) | ORAL | Status: DC
  Administered 2015-12-04 – 2015-12-10 (×14): 25 mg via ORAL
  Filled 2015-12-04 (×14): qty 1

## 2015-12-04 MED ORDER — ONDANSETRON HCL 4 MG/2ML IJ SOLN
4.0000 mg | Freq: Four times a day (QID) | INTRAMUSCULAR | Status: DC | PRN
  Filled 2015-12-04: qty 2

## 2015-12-04 MED ORDER — ACETAMINOPHEN 325 MG PO TABS
650.0000 mg | ORAL_TABLET | Freq: Four times a day (QID) | ORAL | Status: DC | PRN
  Filled 2015-12-04: qty 2

## 2015-12-04 MED ORDER — INSULIN ASPART 100 UNIT/ML ~~LOC~~ SOLN
0.0000 [IU] | Freq: Three times a day (TID) | SUBCUTANEOUS | Status: DC
  Administered 2015-12-04: 2 [IU] via SUBCUTANEOUS
  Administered 2015-12-04: 3 [IU] via SUBCUTANEOUS
  Administered 2015-12-04: 7 [IU] via SUBCUTANEOUS
  Administered 2015-12-05: 5 [IU] via SUBCUTANEOUS
  Administered 2015-12-05 (×2): 3 [IU] via SUBCUTANEOUS
  Administered 2015-12-06: 5 [IU] via SUBCUTANEOUS
  Administered 2015-12-06: 3 [IU] via SUBCUTANEOUS
  Administered 2015-12-06: 5 [IU] via SUBCUTANEOUS
  Administered 2015-12-07 (×2): 3 [IU] via SUBCUTANEOUS
  Administered 2015-12-07: 5 [IU] via SUBCUTANEOUS
  Administered 2015-12-08 – 2015-12-09 (×3): 1 [IU] via SUBCUTANEOUS
  Administered 2015-12-09: 2 [IU] via SUBCUTANEOUS
  Administered 2015-12-09 – 2015-12-10 (×3): 1 [IU] via SUBCUTANEOUS
  Filled 2015-12-04: qty 1
  Filled 2015-12-04 (×2): qty 2
  Filled 2015-12-04 (×3): qty 1
  Filled 2015-12-04: qty 3
  Filled 2015-12-04: qty 5
  Filled 2015-12-04: qty 1
  Filled 2015-12-04: qty 5
  Filled 2015-12-04: qty 3
  Filled 2015-12-04: qty 1
  Filled 2015-12-04 (×2): qty 3
  Filled 2015-12-04: qty 5
  Filled 2015-12-04: qty 3
  Filled 2015-12-04: qty 5
  Filled 2015-12-04: qty 7
  Filled 2015-12-04: qty 3

## 2015-12-04 NOTE — Progress Notes (Signed)
UA resulted likely UTI, possible pyelonephritis given pt back pain.  Will start rocephin.  Culture pending  Jacqulyn [facility] 12/04/2015, 4:13 AM

## 2015-12-04 NOTE — Progress Notes (Signed)
Patient arrived to the unit this evening, stated her back was still hurting from physical therapy this week.  Vital signs stable, patient is alert and oriented.  Skin assessed, toes on both feet are necrotic, patient says she visits a vascular center to have the wounds dressed.  Patient had not reaction to IV solumedrol.  Now resting comfortably.  Will continue to monitor.

## 2015-12-04 NOTE — Care Management (Signed)
Patient admitted to icu stepdown for severe thrombocytopenia for close monitoring.   Her platelets are < 5. She presents from  [facility].  CSW is aware

## 2015-12-04 NOTE — Progress Notes (Signed)
Dr Leslye Peer notified for platelets of 1,000 K/uL. Pt is not actively bleeding. No new orders at this time.

## 2015-12-04 NOTE — ED Notes (Signed)
Hospitalist at bedside 

## 2015-12-04 NOTE — Progress Notes (Signed)
Spoke with pharmacist and per pharmacy okay to double rate of immunogloulin up to 779m/ hr. This was started in ICU and upon transfer to the floor rate was at 61.631mhr. At this time rate is now at 12047mr. No issues at this time, will continue to monitor.

## 2015-12-04 NOTE — Progress Notes (Addendum)
Pharmacy Antibiotic Follow-up Note  April [name] is a 70 y.o. year-old female admitted on 12/03/2015.  The patient is currently on day 1 of Vancomycin for bacteremia.    Assessment/Plan: After discussion with Dr. Jannifer Franklin, antibiotics will be changed from Unasyn to Ceftriaxone 2 gm IV Q24H. . MD will keep Vancomycin for now til condition improves.   Temp (24hrs), Avg:97.8 F (36.6 C), Min:96.4 F (35.8 C), Max:98.8 F (37.1 C)   Recent Labs Lab 12/03/15 1736 12/04/15 0351  WBC 16.7* 19.3*    Recent Labs Lab 12/03/15 1736 12/04/15 0351  CREATININE 0.88 0.86   Estimated Creatinine Clearance: 76 mL/min (by C-G formula based on Cr of 0.86).    Allergies  Allergen Reactions  . Prednisone Anaphylaxis    Antimicrobials this admission:   >> Vancomycin 1 gm IV Q8H   Unasyn 3 gm IV Q6H  >> Ceftriaxone 2 gm IV X Q24H.   Levels/dose changes this admission:   Microbiology results:  BCx:  Anaerobic bottle X 2  Growing Proteus species (KPC)  UCx:    Sputum:    MRSA PCR:   Thank you for allowing pharmacy to be a part of this patient's care.  Laurie Lovejoy D PharmD 12/04/2015 8:57 PM

## 2015-12-04 NOTE — Clinical Social Work Note (Signed)
Clinical Social Work Assessment  Patient Details  Name: April [name] MRN: [id number] Date of Birth: July 28, 1946  Date of referral:  12/04/15               Reason for consult:  Discharge Planning                Permission sought to share information with:    Permission granted to share information::  No  Name::        Agency::     Relationship::     Contact Information:     Housing/Transportation Living arrangements for the past 2 months:  [place] (Patient has been at [facility] sicne Feb 2017 ) Source of Information:  Patient Patient Interpreter Needed:  None Criminal Activity/Legal Involvement Pertinent to Current Situation/Hospitalization:  No - Comment as needed Significant Relationships:  Other Family Members Lives with:  Facility Resident Do you feel safe going back to the place where you live?  Yes Need for family participation in patient care:  No (Coment)  Care giving concerns:  Patient reports that it "Takes [facility] staff to long to tend to my needs".    Social Worker assessment / plan:  CSW was consulted by RN due to patient reporting possibly wanting to look at other STR options. CSW met with patient at bedside. CSW explained her role. Per patient she is from [facility] STR. She reports that she will return but would like to "explore" other SNF options. Requested CSW send her STR referral to SNFs in [facility] Reports that she feels that [facility] does not respond to her needs fast enough and that her room is "nosiey". CSW suggested that she spoke to staff about her concerns. Patient reports that she "does not want to burn bridges with [facility] because if I can't go anywhere else I'd like to return". Patient reports that [facility] social worker is assisting her with her Medicaid application. She stated "I'm a sick lady and I need attentive care". Verbal permissions granted to speak to [facility] about discharge plans. Verbal permission granted to send SNF referral to [facility].  CSW spoke to [facility] worker, international aid at [facility]. Per Helene Kelp patient has been there since around Nov 10, 2015. She reports that patient is in her "copay days". Reports patient can return at discharge.   FL2/ PASRR faxed to SNFs in [place] Awaiting bed offers. CSW will continue to follow and assist.   Employment status:  Retired Forensic scientist:  Medicare PT Recommendations:  Not assessed at this time Information / Referral to community resources:  [place]  Patient/Family's Response to care:  Patient is in agreement to return to [facility] at discharge but would like to "explore" other SNF options.   Patient/Family's Understanding of and Emotional Response to Diagnosis, Current Treatment, and Prognosis:  Patient reports that she understands CSW's role and is appreciative of her assistance.   Emotional Assessment Appearance:  Appears stated age Attitude/Demeanor/Rapport:   (None) Affect (typically observed):  Accepting, Calm, Pleasant Orientation:  Oriented to Self, Oriented to Place, Oriented to  Time, Oriented to Situation Alcohol / Substance use:  Not Applicable Psych involvement (Current and /or in the community):  No (Comment)  Discharge Needs  Concerns to be addressed:  Discharge Planning Concerns Readmission within the last 30 days:  No Current discharge risk:  Chronically ill Barriers to Discharge:  Continued Medical Work up   [employer], LCSW 12/04/2015, 2:49 PM

## 2015-12-04 NOTE — Progress Notes (Signed)
Patient ID: April [name], female   DOB: 20-Jun-1946, 70 y.o.   MRN: [id number] [facility] PROGRESS NOTE  April [name] JJK:[id number] DOB: Jul 25, 1946 DOA: 12/03/2015 PCP: Albina Billet, MD  HPI/Subjective: Patient is having some bleeding from her lips. Patient with some cough and constipation. Patient has hip pain and has not slept in 4 night secondary to the pain.  Objective: Filed Vitals:   12/04/15 0500 12/04/15 0600  BP: 128/66 125/64  Pulse: 78 81  Temp:    Resp: 22 23    Filed Weights   12/03/15 1733 12/04/15 0100  Weight: 113.853 kg (251 lb) 102.7 kg (226 lb 6.6 oz)    ROS: Review of Systems  Constitutional: Negative for fever and chills.  Eyes: Negative for blurred vision.  Respiratory: Positive for cough. Negative for shortness of breath.   Cardiovascular: Negative for chest pain.  Gastrointestinal: Positive for abdominal pain and constipation. Negative for nausea, vomiting and diarrhea.  Genitourinary: Negative for dysuria.  Musculoskeletal: Negative for joint pain.  Neurological: Positive for weakness. Negative for dizziness and headaches.  Endo/Heme/Allergies: Bruises/bleeds easily.   Exam: Physical Exam  Constitutional: She is oriented to person, place, and time.  HENT:  Nose: No mucosal edema.  Mouth/Throat: No oropharyngeal exudate or posterior oropharyngeal edema.  Bleeding from the lips. Petechiae the back of the mouth  Eyes: Conjunctivae, EOM and lids are normal. Pupils are equal, round, and reactive to light.  Neck: No JVD present. Carotid bruit is not present. No edema present. No thyroid mass and no thyromegaly present.  Cardiovascular: S1 normal and S2 normal.  Exam reveals no gallop.   Murmur heard.  Systolic murmur is present with a grade of 2/6  Pulses:      Dorsalis pedis pulses are 2+ on the right side, and 2+ on the left side.  Respiratory: No respiratory distress. She has decreased breath sounds in the right lower field and the  left lower field. She has no wheezes. She has no rhonchi. She has no rales.  GI: Soft. Bowel sounds are normal. There is no tenderness.  Musculoskeletal:       Right ankle: She exhibits swelling.       Left ankle: She exhibits swelling.  Lymphadenopathy:    She has no cervical adenopathy.  Neurological: She is alert and oriented to person, place, and time. No cranial nerve deficit.  Skin: Skin is warm. Nails show no clubbing.  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 Some hazy discoloration on the legs but no clear point of demarcation. Some bruising from the site near the ankle on the right leg. Petechiae seen on her legs.  Psychiatric: She has a normal mood and affect.      Data Reviewed: Basic Metabolic Panel:  Recent Labs Lab 12/03/15 1736 12/04/15 0351  NA 133* 132*  K 3.8 4.3  CL 95* 96*  CO2 33* 27  GLUCOSE 102* 171*  BUN 26* 23*  CREATININE 0.88 0.86  CALCIUM 8.6* 8.5*   CBC:  Recent Labs Lab 12/03/15 1736 12/04/15 0351  WBC 16.7* 19.3*  NEUTROABS 13.6*  --   HGB 10.5* 10.0*  HCT 31.6* 29.7*  MCV 90.4 89.0  PLT 5* <5*    CBG:  Recent Labs Lab 12/04/15 0119 12/04/15 0756  GLUCAP 107* 181*    Recent Results (from the past 240 hour(s))  MRSA PCR Screening     Status: Abnormal  Collection Time: 12/04/15  1:11 AM  Result Value Ref Range Status   MRSA by PCR POSITIVE (A) NEGATIVE Final    Comment:        The GeneXpert MRSA Assay (FDA approved for NASAL specimens only), is one component of a comprehensive MRSA colonization surveillance program. It is not intended to diagnose MRSA infection nor to guide or monitor treatment for MRSA infections. CRITICAL RESULT CALLED TO, READ BACK BY AND VERIFIED WITH: Tia Masker AT 4008 ON 12/04/15 BY VAB      Studies: Dg Chest Port 1 View  12/04/2015  CLINICAL DATA:  Leukocytosis.  Low platelets. EXAM: PORTABLE CHEST 1 VIEW COMPARISON:  11/04/2015 FINDINGS:  Lung volumes are low. Cardiomediastinal contours are unchanged allowing for differences in technique. Mild bibasilar atelectasis. No confluent airspace disease. No large pleural effusion. No pneumothorax. Old left rib fractures. IMPRESSION: Hypoventilatory chest with mild bibasilar atelectasis Electronically Signed   By: Jeb Levering M.D.   On: 12/04/2015 01:13    Scheduled Meds: . sodium chloride   Intravenous Once  . Chlorhexidine Gluconate Cloth  6 each Topical Q0600  . insulin aspart  0-9 Units Subcutaneous TID WC  . methylPREDNISolone (SOLU-MEDROL) injection  60 mg Intravenous Q6H  . metoprolol tartrate  25 mg Oral BID  . mupirocin ointment  1 application Nasal BID  . sodium chloride flush  3 mL Intravenous Q12H    Assessment/Plan:  1. 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 Last admission the patient did have heparin-induced thrombocytopenia also which had recovered. Oncology consultation to review peripheral smear and make recommendations for possible IVIG. Continue steroids at this point. Order platelet transfusion. Patient having some bleeding from her lips and her platelet count is 1000. Benefits and risks of transfusion of platelets explained. 2. 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 Change antibiotics to Unasyn and vancomycin for now. Patient did have a recent hospitalization of septic shock and survived. Patient is seeing Dr. dew as outpatient for the gangrene of the lower extremities and he recommended waiting further prior to any amputations. 3. Essential hypertension, history of atrial fibrillation. Continue metoprolol 4. Type 2 diabetes on sliding scale  Code Status:     Code Status Orders        Start     Ordered   12/04/15 0101  Full code   Continuous     12/04/15 0100    Code Status History    Date Active Date Inactive Code Status Order ID  Comments User Context   10/31/2015 10:13 PM 11/09/2015  6:03 PM Full Code [id number]  Colleen Can, MD Inpatient   10/30/2015  3:10 PM 10/31/2015 10:13 PM DNR [id number]  Colleen Can, MD Inpatient   10/30/2015  2:18 PM 10/30/2015  3:10 PM DNR [id number]  Colleen Can, MD Inpatient   10/20/2015  5:14 PM 10/30/2015  2:18 PM Full Code [id number]  Fritzi Mandes, MD Inpatient     Disposition Plan: To be determined  Consultants:  Oncology  Antibiotics:  Vancomycin  Unasyn  Time spent: 32 minutes critical care time  Loletha Grayer  [facility]

## 2015-12-04 NOTE — ED Notes (Signed)
Port chest x ray completed.

## 2015-12-04 NOTE — Consult Note (Signed)
Pharmacy ICU Daily Progress Note  KA is a 69yo admitted 12/03/15 for cough, constipation, flank pain, and severe thrombocytopenia.  Active pharmacy consults: Vancomycin and unasyn dosing and management.  Plan: 1. Initiate vancomycin 1g IV q12hrs with stacked dosing. Will check first trough prior to the 4th dose at 1430 on 3/11.  AdjBW CrCl: 76 ml/min Ke= 0.067 hrs-1 T1/2= 10.3 hrs Vd= 71.8 L Expected Cpk= 25, Ctr= 12.15  2. Initiate Unsayn 3g IV q6hrs. Continue to follow patient's renal function.  Infectious:  Antibiotics: Vancomycin 1g IV q12hr; 3/10>> Unasyn 3g IV q6hr; 3/10>> Ceftriaxone 2g IV x1 (3/9) WBC 19.3 (16.7) Afebrile Culture Results: MRSA PCR (+) 3/9 UCx pending 3/9 BCx x2 pending  Electrolytes:  Potassium: 4.3 Magnesium: N/a Phosphorus: N/a Supplementation plans: None  Pulmonary:  BD: None Ventilator Status: No O2 Sat/FiO2: 93 on RA  Current steroids: methylprednisolone '60mg'$  IV q6hr  GI:  Constipation PPx: None Feeding status: Carb modified, thin liquids LBM: None SUP: None  Insulin:  SSI use in 24hrs: 2 units Current Insulin orders: SSI TID WC Last 3 CBGs: 171, 102  DVT PPx: Mechanical (ITP/thrombocytopenia)  Sedation+Pain:  RASS goal? None GCS 15 Last pain score: 5 Opioid use in last 24 hrs: '4mg'$  morphine  Pressors: None MAP goal:   Medication education/counseling required? No

## 2015-12-04 NOTE — ED Notes (Signed)
Called floor to let them know pt on the way up

## 2015-12-04 NOTE — Progress Notes (Signed)
Per Dr. Jannifer Franklin and pharmacist okay to use solumedrol despite allergy listed as prednisone.  Tolerated steroids before.  Will monitor very closely after administration.

## 2015-12-04 NOTE — Progress Notes (Signed)
Pharmacy Antibiotic Note  April [name] is a 70 y.o. female admitted on 12/03/2015 with UTI.  Pharmacy has been consulted for ceftriaxone dosing dosing.  Plan: Ceftriaxone 2 grams q 24 hours ordered.  Height: '5\' 7"'$  (170.2 cm) Weight: 226 lb 6.6 oz (102.7 kg) IBW/kg (Calculated) : 61.6  Temp (24hrs), Avg:98.3 F (36.8 C), Min:97.7 F (36.5 C), Max:98.8 F (37.1 C)   Recent Labs Lab 12/03/15 1736 12/04/15 0351  WBC 16.7*  --   CREATININE 0.88 0.86    Estimated Creatinine Clearance: 76 mL/min (by C-G formula based on Cr of 0.86).    Allergies  Allergen Reactions  . Prednisone Anaphylaxis    Antimicrobials this admission:   >>    >>  Dose adjustments this admission:   Microbiology results: 3/9 BCx: pending 3/9 UCx: pending  3/10 MRSA PCR: (+)   3/9 UA: LE(+) NO2(-) WBC TNTC  Thank you for allowing pharmacy to be a part of this patient's care.  Alicja Everitt S 12/04/2015 4:33 AM

## 2015-12-04 NOTE — NC FL2 (Signed)
[place] LEVEL OF CARE SCREENING TOOL     IDENTIFICATION  Patient Name: April [name] Birthdate: 30-Oct-1945 Sex: female Admission Date (Current Location): 12/03/2015  [place] and [place] Number:  Engineering geologist and Address:  [facility], [address] [place], [place] [address]      Provider Number: [id number]  Attending Physician Name and Address:  Loletha Grayer, MD  Relative Name and Phone Number:       Current Level of Care: Hospital Recommended Level of Care: [place] Prior Approval Number:    Date Approved/Denied:   PASRR Number:  [id number])  Discharge Plan: SNF    Current Diagnoses: Patient Active Problem List   Diagnosis Date Noted  . Paroxysmal atrial fibrillation ([place]) 12/04/2015  . Back pain 12/04/2015  . Thrombocytopenia ([place]) 12/03/2015  . Leukocytosis 12/03/2015  . Type 2 diabetes mellitus ([place]) 12/03/2015  . Respiratory failure ([place])   . Proteus septicemia ([place])   . Acute renal failure (ARF) ([place])   . Hydronephrosis   . Endotracheally intubated   . Respiratory distress   . Arterial hypotension   . Sepsis ([place]) 10/20/2015  . Acute renal failure ([place])   . Altered mental status   . NSTEMI (non-ST elevated myocardial infarction) ([place])   . Sepsis due to urinary tract infection (HCC)     Orientation RESPIRATION BLADDER Height & Weight     Self, Time, Situation, Place  O2 (Nasal Cannula 2 (L/min) ) Continent Weight: 226 lb 6.6 oz (102.7 kg) Height:  '5\' 7"'$  (170.2 cm)  BEHAVIORAL SYMPTOMS/MOOD NEUROLOGICAL BOWEL NUTRITION STATUS   (None)  (None ) Continent Diet (Carb Modified )  AMBULATORY STATUS COMMUNICATION OF NEEDS Skin   Extensive Assist Verbally Other (Comment) (Black necrotic tissue covers all 5 times on both feet and goes on top of each foot. )                       Personal Care Assistance Level of Assistance  Bathing, Feeding, Dressing Bathing Assistance: Limited  assistance Feeding assistance: Independent Dressing Assistance: Limited assistance     Functional Limitations Info  Sight, Hearing, Speech Sight Info: Adequate Hearing Info: Adequate Speech Info: Adequate    SPECIAL CARE FACTORS FREQUENCY  PT (By licensed PT)     PT Frequency:  (5)              Contractures      Additional Factors Info  Allergies, Code Status, Insulin Sliding Scale Code Status Info:  (Full Cod) Allergies Info:  (Prednisone)   Insulin Sliding Scale Info:  (insulin aspart (novoLOG) injection 0-9 Units 0-9 Units, Subcutaneous, 3 times daily with meals )       Current Medications (12/04/2015):  This is the current hospital active medication list Current Facility-Administered Medications  Medication Dose Route Frequency Provider Last Rate Last Dose  . acetaminophen (TYLENOL) tablet 650 mg  650 mg Oral Q6H PRN Lance Coon, MD       Or  . acetaminophen (TYLENOL) suppository 650 mg  650 mg Rectal Q6H PRN Lance Coon, MD      . Ampicillin-Sulbactam (UNASYN) 3 g in sodium chloride 0.9 % 100 mL IVPB  3 g Intravenous Q6H Vena Rua, RPH   3 g at 12/04/15 0958  . antiseptic oral rinse (CPC / CETYLPYRIDINIUM CHLORIDE 0.05%) solution 7 mL  7 mL Mouth Rinse BID Loletha Grayer, MD   7 mL at 12/04/15 1000  .  bisacodyl (DULCOLAX) EC tablet 10 mg  10 mg Oral Daily PRN Flora Lipps, MD      . Chlorhexidine Gluconate Cloth 2 % PADS 6 each  6 each Topical Q0600 Lance Coon, MD   6 each at 12/04/15 0600  . dexamethasone (DECADRON) tablet 20 mg  20 mg Oral Daily Cammie Sickle, MD      . Immune Globulin 10% (OCTAGAM) IV infusion 105 g  1 g/kg Intravenous Q24H Cammie Sickle, MD      . insulin aspart (novoLOG) injection 0-9 Units  0-9 Units Subcutaneous TID WC Lance Coon, MD   7 Units at 12/04/15 1219  . metoprolol tartrate (LOPRESSOR) tablet 25 mg  25 mg Oral BID Lance Coon, MD   25 mg at 12/04/15 0909  . morphine 2 MG/ML injection 2 mg  2 mg  Intravenous Q4H PRN Lance Coon, MD   2 mg at 12/04/15 0807  . mupirocin ointment (BACTROBAN) 2 % 1 application  1 application Nasal BID Lance Coon, MD   1 application at 81/15/72 [phone]  . ondansetron (ZOFRAN) tablet 4 mg  4 mg Oral Q6H PRN Lance Coon, MD       Or  . ondansetron [facility]) injection 4 mg  4 mg Intravenous Q6H PRN Lance Coon, MD      . oxyCODONE (Oxy IR/ROXICODONE) immediate release tablet 5 mg  5 mg Oral Q6H PRN Loletha Grayer, MD      . QUEtiapine (SEROQUEL) tablet 25 mg  25 mg Oral QHS Loletha Grayer, MD      . sodium chloride flush (NS) 0.9 % injection 3 mL  3 mL Intravenous Q12H Lance Coon, MD   3 mL at 12/04/15 1000  . vancomycin (VANCOCIN) IVPB 1000 mg/200 mL premix  1,000 mg Intravenous Q12H Vena Rua, [facility]         Discharge Medications: Please see discharge summary for a list of discharge medications.  Relevant Imaging Results:  Relevant Lab Results:   Additional Information  (SSN [id number])  April Quarry [name], LCSW

## 2015-12-04 NOTE — ED Notes (Signed)
ICU notified pt being transferred at this time.

## 2015-12-04 NOTE — Consult Note (Signed)
[place] CONSULT NOTE  Patient Care Team: Albina Billet, MD as PCP - General (Internal Medicine)  CHIEF COMPLAINTS/PURPOSE OF CONSULTATION: Severe thrombocytopenia  HISTORY OF PRESENTING ILLNESS:  April [name] 70 y.o.  female Caucasian patient- was recently discharged from the hospital in middle of February when she presented with multiorgan dysfunction severe sepsis- with acute renal failure/altered mental status/respiratory failure- and finally nursing home. That hospital stay was also complicated by thrombocytopenia ? HIT; heparin was stopped. She was not treated with DTI/ N not discharged on any anti-coagulation.  Patient states that she has not been recently on any Lovenox or heparin at the nursing home. She also denies being on any antibiotics. She noted to have nosebleeds or gum bleeding in the last few days/ that led to blood checked in the nursing home- that showed severe thrombus cytopenia. She is currently admitted to the ICU for further workup.  Patient denies any active bleeding at this time. Otherwise denies any bleeding from the rectum or vaginal bleeding. Her appetite is fair. No new shortness of breath or chest pain. No nausea or vomiting.   Patient is bedbound given her gangrenous feet; needing possible amputation.  ROS: A complete 10 point review of system is done which is negative except mentioned above in history of present illness  MEDICAL HISTORY:  Past Medical History  Diagnosis Date  . Renal disorder   . NSTEMI (non-ST elevated myocardial infarction) ([place])   . Acute renal failure (ARF) ([place])   . Type 2 diabetes mellitus ([place])   . Paroxysmal atrial fibrillation (HCC)     SURGICAL HISTORY: Past Surgical History  Procedure Laterality Date  . Cystoscopy with stent placement Right 11/03/2015    Procedure: CYSTOSCOPY WITH STENT PLACEMENT;  Surgeon: Hollice Espy, MD;  Location: [facility] ORS;  Service: Urology;  Laterality: Right;  . Tonsillectomy       SOCIAL HISTORY: Social History   Social History  . Marital Status: Single    Spouse Name: N/A  . Number of Children: N/A  . Years of Education: N/A   Occupational History  . Not on file.   Social History Main Topics  . Smoking status: Never Smoker   . Smokeless tobacco: Not on file  . Alcohol Use: No  . Drug Use: No  . Sexual Activity: Not on file   Other Topics Concern  . Not on file   Social History Narrative    FAMILY HISTORY: Family History  Problem Relation Age of Onset  . Diabetes Father   . Stroke Maternal Grandmother   . Heart disease Father     ALLERGIES:  is allergic to prednisone.  MEDICATIONS:  Current Facility-Administered Medications  Medication Dose Route Frequency Provider Last Rate Last Dose  . acetaminophen (TYLENOL) tablet 650 mg  650 mg Oral Q6H PRN Lance Coon, MD       Or  . acetaminophen (TYLENOL) suppository 650 mg  650 mg Rectal Q6H PRN Lance Coon, MD      . Ampicillin-Sulbactam (UNASYN) 3 g in sodium chloride 0.9 % 100 mL IVPB  3 g Intravenous Q6H Vena Rua, RPH   3 g at 12/04/15 0958  . antiseptic oral rinse (CPC / CETYLPYRIDINIUM CHLORIDE 0.05%) solution 7 mL  7 mL Mouth Rinse BID Loletha Grayer, MD   7 mL at 12/04/15 1000  . Chlorhexidine Gluconate Cloth 2 % PADS 6 each  6 each Topical Q0600 Lance Coon, MD   6 each at 12/04/15 0600  .  insulin aspart (novoLOG) injection 0-9 Units  0-9 Units Subcutaneous TID WC Lance Coon, MD   7 Units at 12/04/15 1219  . methylPREDNISolone sodium succinate (SOLU-MEDROL) 125 mg/2 mL injection 60 mg  60 mg Intravenous Q6H Lance Coon, MD   60 mg at 12/04/15 1219  . metoprolol tartrate (LOPRESSOR) tablet 25 mg  25 mg Oral BID Lance Coon, MD   25 mg at 12/04/15 0909  . morphine 2 MG/ML injection 2 mg  2 mg Intravenous Q4H PRN Lance Coon, MD   2 mg at 12/04/15 0807  . mupirocin ointment (BACTROBAN) 2 % 1 application  1 application Nasal BID Lance Coon, MD   1 application at 16/10/96  [phone]  . ondansetron (ZOFRAN) tablet 4 mg  4 mg Oral Q6H PRN Lance Coon, MD       Or  . ondansetron [facility]) injection 4 mg  4 mg Intravenous Q6H PRN Lance Coon, MD      . oxyCODONE (Oxy IR/ROXICODONE) immediate release tablet 5 mg  5 mg Oral Q6H PRN Loletha Grayer, MD      . QUEtiapine (SEROQUEL) tablet 25 mg  25 mg Oral QHS Loletha Grayer, MD      . sodium chloride flush (NS) 0.9 % injection 3 mL  3 mL Intravenous Q12H Lance Coon, MD   3 mL at 12/04/15 1000  . vancomycin (VANCOCIN) IVPB 1000 mg/200 mL premix  1,000 mg Intravenous Q12H Vena Rua, RPH          .  PHYSICAL EXAMINATION:   Filed Vitals:   12/04/15 1200 12/04/15 1335  BP: 128/74 133/69  Pulse:  80  Temp: 97.4 F (36.3 C) 96.4 F (35.8 C)  Resp:  24   Filed Weights   12/03/15 1733 12/04/15 0100  Weight: 251 lb (113.853 kg) 226 lb 6.6 oz (102.7 kg)    GENERAL: Well-nourished well-developed; Alert, no distress and comfortable. She is accompanied by friends. EYES: no pallor or icterus OROPHARYNX: no thrush or ulceration; good dentition; oral mucosa shows petechiae/bleeding [clotted blood] NECK: supple, no masses felt LYMPH:  no palpable lymphadenopathy in the cervical, axillary or inguinal regions LUNGS: Decreased air entry bilaterally  No wheeze or crackles HEART/CVS: regular rate & rhythm and no murmurs; bilateral gangrenous extremities noted ABDOMEN: abdomen soft, non-tender and normal bowel sounds Musculoskeletal:no cyanosis of digits and no clubbing  PSYCH: alert & oriented x 3 with fluent speech NEURO: no focal motor/sensory deficits SKIN:  Multiple ecchymosis noted. [place]  LABORATORY DATA:  I have reviewed the data as listed Lab Results  Component Value Date   WBC 19.3* 12/04/2015   HGB 10.0* 12/04/2015   HCT 29.7* 12/04/2015   MCV 89.0 12/04/2015   PLT <5* 12/04/2015    Recent Labs  10/26/15 0400  11/02/15 0509 11/03/15 0539  11/09/15 0506 12/03/15 1736 12/04/15 0351  NA  145  < > 150* 146*  < > 140 133* 132*  K 3.2*  < > 3.5 2.8*  < > 3.3* 3.8 4.3  CL 110  < > 119* 114*  < > 109 95* 96*  CO2 29  < > 25 29  < > 28 33* 27  GLUCOSE 232*  < > 184* 255*  < > 154* 102* 171*  BUN 89*  < > 30* 28*  < > 14 26* 23*  CREATININE 2.11*  < > 0.98 1.06*  < > 0.73 0.88 0.86  CALCIUM 7.5*  < > 7.7* 7.5*  < > 7.8* 8.6*  8.5*  GFRNONAA 23*  < > 58* 52*  < > >60 >60 >60  GFRAA 26*  < > >60 >60  < > >60 >60 >60  PROT 5.3*  --  5.8* 5.4*  --   --   --   --   ALBUMIN 1.7*  --  1.7* 1.6*  --   --   --   --   AST 92*  --  20 20  --   --   --   --   ALT 174*  --  29 26  --   --   --   --   ALKPHOS 188*  --  80 87  --   --   --   --   BILITOT 2.1*  --  1.0 0.7  --   --   --   --   < > = values in this interval not displayed.  RADIOGRAPHIC STUDIES: I have personally reviewed the radiological images as listed and agreed with the findings in the report. Dg Chest Port 1 View  12/04/2015  CLINICAL DATA:  Leukocytosis.  Low platelets. EXAM: PORTABLE CHEST 1 VIEW COMPARISON:  11/04/2015 FINDINGS: Lung volumes are low. Cardiomediastinal contours are unchanged allowing for differences in technique. Mild bibasilar atelectasis. No confluent airspace disease. No large pleural effusion. No pneumothorax. Old left rib fractures. IMPRESSION: Hypoventilatory chest with mild bibasilar atelectasis Electronically Signed   By: Jeb Levering M.D.   On: 12/04/2015 01:13   Dg Chest Port 1 View  11/04/2015  CLINICAL DATA:  PICC line insertion. EXAM: PORTABLE CHEST 1 VIEW COMPARISON:  Chest x-ray dated 11/01/2015. FINDINGS: Left-sided central line in place with tip stable in position at the upper margin of the SVC. New right-sided PICC line now in place with tip well-positioned in the lower SVC. Cardiomediastinal silhouette is stable in size and configuration. Central pulmonary vascular congestion and bilateral interstitial edema persists, but decreased compared to the previous exam. Opacities at each lung  base most likely representing a combination of small pleural effusions and atelectasis. IMPRESSION: 1. Right-sided PICC line placement with tip well-positioned in the lower SVC, near the expected level of the cavoatrial junction. 2. Left-sided central line stable in position with tip at the upper margin of the SVC. 3. Persistent central pulmonary vascular congestion and bilateral interstitial edema, but improved compared to the previous exam suggesting improved fluid status. 4. Probable small bilateral pleural effusions and bibasilar atelectasis. Electronically Signed   By: Franki Cabot M.D.   On: 11/04/2015 14:37    ASSESSMENT & PLAN:   # Severe thrombocytopenia symptomatic bleeding. Unclear etiology. ITP versus medication induced. Patient has not been on any recent antibiotics. 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 Heparin-induced thrombocytopenia- does not cause such severe thrombocytopenia; and also should not cause bleeding.  # I would treat the patient with steroids [start dexamethasone 20 mg] by mouth daily 4 days/ and also IVIG. Check a peripheral smear. Agree with platelet transfusion given the symptomatic bleeding.  # The above plan of care was discussed with the hospitalist Dr. Leslye Peer. Also, discussed with the patient in detail.  Thank you Dr.Wieting for allowing me to participate in the care of your pleasant patient. Please do not hesitate to contact me with questions or concerns in the interim.      Cammie Sickle, MD 12/04/2015 1:42 PM

## 2015-12-04 NOTE — Consult Note (Signed)
WOC wound consult note Reason for Consult:Gangrene present to all toes and plantar surface of the feet near the toes bilaterally.  Some hazy discoloration on the legs but no clear point of demarcation. Some bruising from the site near the ankle on the right leg. Petechiae seen on her legs.   Wound type:Dry gangrene Pressure Ulcer POA: N/A Measurement:Generalized dry gangrene to toes and feet.  Wound IHW:[id number], dry and intact Drainage (amount, consistency, odor) None Periwound:Intact Dressing procedure/placement/frequency:Dry dressings to bilateral feet and toes.  Kerlix between each toe.  Change daily.  Will not follow at this time.  Please re-consult if needed.  Domenic Moras RN BSN [place] Pager [phone]

## 2015-12-04 NOTE — Progress Notes (Signed)
°   12/04/15 0700  Clinical Encounter Type  Visited With Patient  Visit Type Initial  Referral From Nurse  Consult/Referral To Chaplain  Spiritual Encounters  Spiritual Needs Other (Comment)  Patient requested AD documents.  She stated that she would prefer to look through them later because she was tired. [place] Ext [phone]

## 2015-12-05 LAB — CBC
HCT: 25.8 % — ABNORMAL LOW (ref 35.0–47.0)
HEMOGLOBIN: 8.6 g/dL — AB (ref 12.0–16.0)
MCH: 30.4 pg (ref 26.0–34.0)
MCHC: 33.3 g/dL (ref 32.0–36.0)
MCV: 91.1 fL (ref 80.0–100.0)
PLATELETS: 10 10*3/uL — AB (ref 150–440)
RBC: 2.84 MIL/uL — AB (ref 3.80–5.20)
RDW: 15.5 % — ABNORMAL HIGH (ref 11.5–14.5)
WBC: 29 10*3/uL — AB (ref 3.6–11.0)

## 2015-12-05 LAB — VANCOMYCIN, TROUGH: VANCOMYCIN TR: 16 ug/mL (ref 10–20)

## 2015-12-05 LAB — BASIC METABOLIC PANEL
Anion gap: 8 (ref 5–15)
BUN: 21 mg/dL — AB (ref 6–20)
CHLORIDE: 98 mmol/L — AB (ref 101–111)
CO2: 25 mmol/L (ref 22–32)
CREATININE: 0.75 mg/dL (ref 0.44–1.00)
Calcium: 8.5 mg/dL — ABNORMAL LOW (ref 8.9–10.3)
Glucose, Bld: 295 mg/dL — ABNORMAL HIGH (ref 65–99)
POTASSIUM: 3.9 mmol/L (ref 3.5–5.1)
SODIUM: 131 mmol/L — AB (ref 135–145)

## 2015-12-05 LAB — CBC WITH DIFFERENTIAL/PLATELET
BLASTS: 0 %
Band Neutrophils: 0 %
Basophils Absolute: 0 10*3/uL (ref 0–0.1)
Basophils Relative: 0 %
EOS ABS: 0 10*3/uL (ref 0–0.7)
Eosinophils Relative: 0 %
HCT: 24.6 % — ABNORMAL LOW (ref 35.0–47.0)
HEMOGLOBIN: 8.2 g/dL — AB (ref 12.0–16.0)
LYMPHS ABS: 1.5 10*3/uL (ref 1.0–3.6)
LYMPHS PCT: 7 %
MCH: 29.9 pg (ref 26.0–34.0)
MCHC: 33.3 g/dL (ref 32.0–36.0)
MCV: 90 fL (ref 80.0–100.0)
MONO ABS: 1.3 10*3/uL — AB (ref 0.2–0.9)
Metamyelocytes Relative: 0 %
Monocytes Relative: 6 %
Myelocytes: 2 %
NEUTROS PCT: 85 %
Neutro Abs: 19.3 10*3/uL — ABNORMAL HIGH (ref 1.4–6.5)
Other: 0 %
PROMYELOCYTES ABS: 0 %
RBC: 2.73 MIL/uL — ABNORMAL LOW (ref 3.80–5.20)
RDW: 15.1 % — ABNORMAL HIGH (ref 11.5–14.5)
WBC: 22.1 10*3/uL — ABNORMAL HIGH (ref 3.6–11.0)
nRBC: 0 /100 WBC

## 2015-12-05 LAB — GLUCOSE, CAPILLARY
GLUCOSE-CAPILLARY: 246 mg/dL — AB (ref 65–99)
GLUCOSE-CAPILLARY: 266 mg/dL — AB (ref 65–99)
GLUCOSE-CAPILLARY: 248 mg/dL — AB (ref 65–99)
Glucose-Capillary: 279 mg/dL — ABNORMAL HIGH (ref 65–99)

## 2015-12-05 MED ORDER — SODIUM CHLORIDE 0.9% FLUSH: 3.0000 mL | INTRAVENOUS | Status: DC | PRN

## 2015-12-05 MED ORDER — SODIUM CHLORIDE 0.9 % IV SOLN: Freq: Once | INTRAVENOUS | Status: DC

## 2015-12-05 MED ORDER — DIPHENHYDRAMINE HCL 25 MG PO CAPS
25.0000 mg | ORAL_CAPSULE | Freq: Once | ORAL | Status: AC
  Administered 2015-12-05: 25 mg via ORAL
  Filled 2015-12-05: qty 1

## 2015-12-05 MED ORDER — HEPARIN SOD (PORK) LOCK FLUSH 100 UNIT/ML IV SOLN
250.0000 [IU] | INTRAVENOUS | Status: DC | PRN
  Filled 2015-12-05: qty 3

## 2015-12-05 MED ORDER — SODIUM CHLORIDE 0.9 % IV SOLN
250.0000 mL | Freq: Once | INTRAVENOUS | Status: AC
Start: 2015-12-05 — End: 2015-12-05
  Administered 2015-12-05: 250 mL via INTRAVENOUS

## 2015-12-05 MED ORDER — HEPARIN SOD (PORK) LOCK FLUSH 100 UNIT/ML IV SOLN
500.0000 [IU] | Freq: Every day | INTRAVENOUS | Status: DC | PRN
  Filled 2015-12-05: qty 5

## 2015-12-05 MED ORDER — SODIUM CHLORIDE 0.9% FLUSH: 10.0000 mL | INTRAVENOUS | Status: DC | PRN

## 2015-12-05 MED ORDER — ACETAMINOPHEN 325 MG PO TABS
650.0000 mg | ORAL_TABLET | Freq: Once | ORAL | Status: AC
  Administered 2015-12-05: 650 mg via ORAL

## 2015-12-05 NOTE — Progress Notes (Signed)
Patient ID: April [name], female   DOB: 1946-09-12, 70 y.o.   MRN: [id number] [facility] PROGRESS NOTE  April [name] QIO:[id number] DOB: Feb 26, 1946 DOA: 12/03/2015 PCP: Albina Billet, MD  HPI/Subjective: Patient is feeling better. She reports that she had a bowel movement yesterday. No further bleeding noted.    Objective: Filed Vitals:   12/05/15 0530 12/05/15 0830  BP: 128/57 138/68  Pulse: 66 71  Temp: 97.4 F (36.3 C)   Resp: 14     Filed Weights   12/03/15 1733 12/04/15 0100  Weight: 113.853 kg (251 lb) 102.7 kg (226 lb 6.6 oz)    ROS: Review of Systems  Constitutional: Negative for fever and chills.  Eyes: Negative for blurred vision.  Respiratory: Positive for cough. Negative for shortness of breath.   Cardiovascular: Negative for chest pain.  Gastrointestinal: Constipation result. Negative for nausea, vomiting and diarrhea.  Genitourinary: Negative for dysuria.  Musculoskeletal: Negative for joint pain.  Neurological: Positive for weakness. Negative for dizziness and headaches.  Endo/Heme/Allergies: Bruises/bleeds easily.   Exam: Physical Exam  Constitutional: She is oriented to person, place, and time.  HENT:  Nose: No mucosal edema.  Mouth/Throat: No oropharyngeal exudate or posterior oropharyngeal edema.  Bleeding from the lips. Petechiae the back of the mouth  Eyes: Conjunctivae, EOM and lids are normal. Pupils are equal, round, and reactive to light.  Neck: No JVD present. Carotid bruit is not present. No edema present. No thyroid mass and no thyromegaly present.  Cardiovascular: S1 normal and S2 normal.  Exam reveals no gallop.   Murmur present  Systolic murmur is present with a grade of 2/6  Pulses:      Dorsalis pedis pulses are 2+ on the right side, and 2+ on the left side.  Respiratory: Diminished breath sounds in bilateral lower lung fields.  GI: Soft. Bowel sounds are normal. There is no tenderness.  Musculoskeletal:       Right  ankle: She exhibits swelling.       Left ankle: She exhibits swelling.  Lymphadenopathy:    She has no cervical adenopathy.  Neurological: She is alert and oriented to person, place, and time. No cranial nerve deficit.  Skin: Skin is warm. Nails show no clubbing.  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  Psychiatric: She has a normal mood and affect.      Data Reviewed: Basic Metabolic Panel:  Recent Labs Lab 12/03/15 1736 12/04/15 0351 12/05/15 0520  NA 133* 132* 131*  K 3.8 4.3 3.9  CL 95* 96* 98*  CO2 33* 27 25  GLUCOSE 102* 171* 295*  BUN 26* 23* 21*  CREATININE 0.88 0.86 0.75  CALCIUM 8.6* 8.5* 8.5*   CBC:  Recent Labs Lab 12/03/15 1736 12/04/15 0351 12/05/15 0520  WBC 16.7* 19.3* 22.1*  NEUTROABS 13.6*  --  19.3*  HGB 10.5* 10.0* 8.2*  HCT 31.6* 29.7* 24.6*  MCV 90.4 89.0 90.0  PLT 5* <5* <5*    CBG:  Recent Labs Lab 12/04/15 1213 12/04/15 1608 12/04/15 1801 12/04/15 2130 12/05/15 0805  GLUCAP 309* 237* 224* 275* 246*    Recent Results (from the past 240 hour(s))  Culture, blood (Routine X 2) w Reflex to ID Panel     Status: None (Preliminary result)   Collection Time: 12/03/15 12:00 AM  Result Value Ref Range Status   Specimen Description BLOOD LEFT ARM  Final   Special Requests BOTTLES  DRAWN AEROBIC AND ANAEROBIC 5ML  Final   Culture  Setup Time   Final    GRAM NEGATIVE RODS IN BOTH AEROBIC AND ANAEROBIC BOTTLES CRITICAL RESULT CALLED TO, READ BACK BY AND VERIFIED WITH: JASON ROBBINS AT 2035 12/04/15 MLZ     Culture   Final    PROTEUS SPECIES IN BOTH AEROBIC AND ANAEROBIC BOTTLES CONFIRMED BY RW IDENTIFICATION TO FOLLOW    Report Status PENDING  Incomplete  Blood Culture ID Panel (Reflexed)     Status: Abnormal   Collection Time: 12/03/15 12:00 AM  Result Value Ref Range Status   Enterococcus species NOT DETECTED NOT DETECTED Final   Vancomycin resistance NOT DETECTED NOT DETECTED Final    Listeria monocytogenes NOT DETECTED NOT DETECTED Final   Staphylococcus species NOT DETECTED NOT DETECTED Final   Staphylococcus aureus NOT DETECTED NOT DETECTED Final   Methicillin resistance NOT DETECTED NOT DETECTED Final   Streptococcus species NOT DETECTED NOT DETECTED Final   Streptococcus agalactiae NOT DETECTED NOT DETECTED Final   Streptococcus pneumoniae NOT DETECTED NOT DETECTED Final   Streptococcus pyogenes NOT DETECTED NOT DETECTED Final   Acinetobacter baumannii NOT DETECTED NOT DETECTED Final   Enterobacteriaceae species DETECTED (A) NOT DETECTED Final    Comment: CALLED TO JASON ROBBINS AT 2035 12/04/15 MLZ    Enterobacter cloacae complex NOT DETECTED NOT DETECTED Final   Escherichia coli NOT DETECTED NOT DETECTED Final   Klebsiella oxytoca NOT DETECTED NOT DETECTED Final   Klebsiella pneumoniae NOT DETECTED NOT DETECTED Final   Proteus species DETECTED (A) NOT DETECTED Final    Comment: CALLED TO JASON ROBBINS AT 2035 12/04/15 MLZ    Serratia marcescens NOT DETECTED NOT DETECTED Final   Carbapenem resistance NOT DETECTED NOT DETECTED Final   Haemophilus influenzae NOT DETECTED NOT DETECTED Final   Neisseria meningitidis NOT DETECTED NOT DETECTED Final   Pseudomonas aeruginosa NOT DETECTED NOT DETECTED Final   Candida albicans NOT DETECTED NOT DETECTED Final   Candida glabrata NOT DETECTED NOT DETECTED Final   Candida krusei NOT DETECTED NOT DETECTED Final   Candida parapsilosis NOT DETECTED NOT DETECTED Final   Candida tropicalis NOT DETECTED NOT DETECTED Final  Culture, blood (Routine X 2) w Reflex to ID Panel     Status: None (Preliminary result)   Collection Time: 12/03/15 11:45 PM  Result Value Ref Range Status   Specimen Description BLOOD LEFT ARM  Final   Special Requests   Final    BOTTLES DRAWN AEROBIC AND ANAEROBIC 10MLAEROBIC, 5MLANAEROBIC   Culture  Setup Time   Final    GRAM NEGATIVE RODS IN BOTH AEROBIC AND ANAEROBIC BOTTLES CRITICAL RESULT CALLED TO,  READ BACK BY AND VERIFIED WITH: JASON ROBBINS AT 2035 12/04/15 MLZ     Culture   Final    GRAM NEGATIVE RODS IN BOTH AEROBIC AND ANAEROBIC BOTTLES IDENTIFICATION TO FOLLOW CONFIRMED BY RW     Report Status PENDING  Incomplete  MRSA PCR Screening     Status: Abnormal   Collection Time: 12/04/15  1:11 AM  Result Value Ref Range Status   MRSA by PCR POSITIVE (A) NEGATIVE Final    Comment:        The GeneXpert MRSA Assay (FDA approved for NASAL specimens only), is one component of a comprehensive MRSA colonization surveillance program. It is not intended to diagnose MRSA infection nor to guide or monitor treatment for MRSA infections. CRITICAL RESULT CALLED TO, READ BACK BY AND VERIFIED WITH: MICHELLE WILLIAMS AT 5784 ON  12/04/15 BY VAB      Studies: Dg Chest Port 1 View  12/04/2015  CLINICAL DATA:  Leukocytosis.  Low platelets. EXAM: PORTABLE CHEST 1 VIEW COMPARISON:  11/04/2015 FINDINGS: Lung volumes are low. Cardiomediastinal contours are unchanged allowing for differences in technique. Mild bibasilar atelectasis. No confluent airspace disease. No large pleural effusion. No pneumothorax. Old left rib fractures. IMPRESSION: Hypoventilatory chest with mild bibasilar atelectasis Electronically Signed   By: Jeb Levering M.D.   On: 12/04/2015 01:13    Scheduled Meds: . sodium chloride  250 mL Intravenous Once  . acetaminophen  650 mg Oral Once  . antiseptic oral rinse  7 mL Mouth Rinse BID  . cefTRIAXone (ROCEPHIN)  IV  2 g Intravenous Q24H  . Chlorhexidine Gluconate Cloth  6 each Topical Q0600  . dexamethasone  20 mg Oral Daily  . diphenhydrAMINE  25 mg Oral Once  . IMMUNE GLOBULIN 10% (HUMAN) IV - For Fluid Restriction Only  1 g/kg Intravenous Q24H  . insulin aspart  0-9 Units Subcutaneous TID WC  . metoprolol tartrate  25 mg Oral BID  . mupirocin ointment  1 application Nasal BID  . QUEtiapine  25 mg Oral QHS  . sodium chloride flush  3 mL Intravenous Q12H  . vancomycin   1,000 mg Intravenous Q12H    Assessment/Plan:  1. 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 Continue IVIG and Decadron. Plan for platelet transfusion.  2. 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  Patient currently on IV vancomycin and ceftriaxone. Cultures from blood shows Proteus species. WBC elevated. This also could be related to gangrene of the toes.  3. Essential hypertension blood pressure control continue metoprolol  4. Type 2 diabetes elevated due to Decadron. Continue to monitor may need some Lantus  5. History of atrial fibrillation continue metoprolol no evidence elevated heart rate discontinue telemetry  Code Status:     Code Status Orders        Start     Ordered   12/04/15 0101  Full code   Continuous     12/04/15 0100    Code Status History    Date Active Date Inactive Code Status Order ID Comments User Context   10/31/2015 10:13 PM 11/09/2015  6:03 PM Full Code [id number]  Colleen Can, MD Inpatient   10/30/2015  3:10 PM 10/31/2015 10:13 PM DNR [id number]  Colleen Can, MD Inpatient   10/30/2015  2:18 PM 10/30/2015  3:10 PM DNR [id number]  Colleen Can, MD Inpatient   10/20/2015  5:14 PM 10/30/2015  2:18 PM Full Code [id number]  Fritzi Mandes, MD Inpatient     Disposition Plan: To be determined  Consultants:  Oncology  Antibiotics:  Vancomycin  Unasyn  Time spent: [place], [place] [facility]

## 2015-12-05 NOTE — Progress Notes (Signed)
Talked to [facility] ,Pharmacist , the patient needs to have  total dose of 105 grams needed to be infused. At this time one bottle (20 G is infusing).

## 2015-12-05 NOTE — Progress Notes (Signed)
Second bottle of immune globulin (10 G) is infusing. We will continue to monitor. Patient alert,oriented and tolerated well with 1 unit platelet transfusion . She will get a second unit of platelet tonight per MD. (oncologist/hematologist)

## 2015-12-05 NOTE — Progress Notes (Addendum)
April [name]   DOB:03/14/1946   UX#:[id number]    Subjective: Denies any nosebleeds overnight. Denies any gum bleeding. Complains of being"cold".   ROS: Positive for constipation; no diarrhea. No chest pain or cough.  Objective:  Filed Vitals:   12/05/15 0530 12/05/15 0830  BP: 128/57 138/68  Pulse: 66 71  Temp: 97.4 F (36.3 C)   Resp: 14      Intake/Output Summary (Last 24 hours) at 12/05/15 0902 Last data filed at 12/05/15 0000  Gross per 24 hour  Intake 1077.7 ml  Output   1450 ml  Net -372.3 ml    GENERAL: Well-nourished well-developed; Alert, no distress and comfortable. She is alone. EYES: no pallor or icterus OROPHARYNX: no thrush or ulceration; good dentition; oral mucosa shows petechiae/bleeding [clotted blood] NECK: supple, no masses felt LYMPH: no palpable lymphadenopathy in the cervical, axillary or inguinal regions LUNGS: Decreased air entry bilaterally No wheeze or crackles HEART/CVS: regular rate & rhythm and no murmurs; bilateral gangrenous extremities noted ABDOMEN: abdomen soft, non-tender and normal bowel sounds Musculoskeletal:no cyanosis of digits and no clubbing  PSYCH: alert & oriented x 3 with fluent speech NEURO: no focal motor/sensory deficits SKIN: Multiple ecchymosis noted.   Labs:  Lab Results  Component Value Date   WBC 22.1* 12/05/2015   HGB 8.2* 12/05/2015   HCT 24.6* 12/05/2015   MCV 90.0 12/05/2015   PLT <5* 12/05/2015   NEUTROABS 19.3* 12/05/2015    Lab Results  Component Value Date   NA 132* 12/04/2015   K 4.3 12/04/2015   CL 96* 12/04/2015   CO2 27 12/04/2015    Studies:  Dg Chest Port 1 View  12/04/2015  CLINICAL DATA:  Leukocytosis.  Low platelets. EXAM: PORTABLE CHEST 1 VIEW COMPARISON:  11/04/2015 FINDINGS: Lung volumes are low. Cardiomediastinal contours are unchanged allowing for differences in technique. Mild bibasilar atelectasis. No confluent airspace disease. No large pleural effusion. No pneumothorax. Old  left rib fractures. IMPRESSION: Hypoventilatory chest with mild bibasilar atelectasis Electronically Signed   By: Jeb Levering M.D.   On: 12/04/2015 01:13    Assessment & Plan:   # Severe thrombocytopenia symptomatic bleeding. Unclear etiology. ITP versus medication induced. NO improvement s/p platelet transfusion 3/10. 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 Heparin-induced thrombocytopenia- does not cause such severe thrombocytopenia; and also should not cause bleeding.  # Continue steroids [3/10-dexamethasone 20 mg] by mouth daily 4 days; s/p IVIG [1st dose- 3/10; today one more dose]. Transfuse 1 more unit of platelets today.   # Bacteremia- on Anti-biotics per primary team.  #  Discussed with the nurse; we'll plan to get CBC 2 hours post platelet transfusion today.  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 I suspect at previous admission patient did not have HI T.

## 2015-12-06 DIAGNOSIS — R7881 Bacteremia: Secondary | ICD-10-CM

## 2015-12-06 LAB — GLUCOSE, CAPILLARY
GLUCOSE-CAPILLARY: 231 mg/dL — AB (ref 65–99)
GLUCOSE-CAPILLARY: 218 mg/dL — AB (ref 65–99)
Glucose-Capillary: 269 mg/dL — ABNORMAL HIGH (ref 65–99)
Glucose-Capillary: 269 mg/dL — ABNORMAL HIGH (ref 65–99)

## 2015-12-06 LAB — URINE CULTURE

## 2015-12-06 LAB — CBC
HCT: 23 % — ABNORMAL LOW (ref 35.0–47.0)
HEMOGLOBIN: 7.7 g/dL — AB (ref 12.0–16.0)
MCH: 29.8 pg (ref 26.0–34.0)
MCHC: 33.3 g/dL (ref 32.0–36.0)
MCV: 89.7 fL (ref 80.0–100.0)
Platelets: 44 10*3/uL — ABNORMAL LOW (ref 150–440)
RBC: 2.57 MIL/uL — AB (ref 3.80–5.20)
RDW: 15.4 % — ABNORMAL HIGH (ref 11.5–14.5)
WBC: 22.8 10*3/uL — ABNORMAL HIGH (ref 3.6–11.0)

## 2015-12-06 LAB — PREPARE PLATELET PHERESIS
UNIT DIVISION: 0
Unit division: 0
Unit division: 0

## 2015-12-06 LAB — BASIC METABOLIC PANEL
Anion gap: 8 (ref 5–15)
BUN: 24 mg/dL — AB (ref 6–20)
CHLORIDE: 99 mmol/L — AB (ref 101–111)
CO2: 25 mmol/L (ref 22–32)
Calcium: 8.4 mg/dL — ABNORMAL LOW (ref 8.9–10.3)
Creatinine, Ser: 0.72 mg/dL (ref 0.44–1.00)
GFR calc Af Amer: >60 mL/min (ref >60)
GFR calc non Af Amer: >60 mL/min (ref >60)
GLUCOSE: 279 mg/dL — AB (ref 65–99)
POTASSIUM: 3.8 mmol/L (ref 3.5–5.1)
Sodium: 132 mmol/L — ABNORMAL LOW (ref 135–145)

## 2015-12-06 LAB — CULTURE, BLOOD (ROUTINE X 2)

## 2015-12-06 LAB — VANCOMYCIN, TROUGH: Vancomycin Tr: 19 ug/mL (ref 10–20)

## 2015-12-06 NOTE — Progress Notes (Addendum)
Patient ID: April [name], female   DOB: 21-Jan-1946, 70 y.o.   MRN: [id number] [facility] PROGRESS NOTE  April [name]:[id number] DOB: June 06, 1946 DOA: 12/03/2015 PCP: Albina Billet, MD  HPI/Subjective: No further bleeding noted. Patient received platelet count yesterday and has been started on IVIG platelet count improved hemoglobin trending down. Complains of back pain  Objective: Filed Vitals:   12/06/15 0101 12/06/15 0429  BP: 145/70 158/87  Pulse: 69 59  Temp: 97.5 F (36.4 C) 97.5 F (36.4 C)  Resp: 14 20    Filed Weights   12/03/15 1733 12/04/15 0100  Weight: 113.853 kg (251 lb) 102.7 kg (226 lb 6.6 oz)    ROS: Review of Systems  Constitutional: Negative for fever and chills.  Eyes: Negative for blurred vision.  Respiratory: Positive for cough. Negative for shortness of breath.   Cardiovascular: Negative for chest pain.  Gastrointestinal: Constipation result. Negative for nausea, vomiting and diarrhea.  Genitourinary: Negative for dysuria.  Musculoskeletal: Negative for joint pain.  Neurological: Positive for weakness. Negative for dizziness and headaches.  Endo/Heme/Allergies: Bruises/bleeds easily.   Exam: Physical Exam  Constitutional: She is oriented to person, place, and time.  HENT:  Nose: No mucosal edema.  Mouth/Throat: No oropharyngeal exudate or posterior oropharyngeal edema.  Eyes: Conjunctivae, EOM and lids are normal. Pupils are equal, round, and reactive to light.  Neck: No JVD present. Carotid bruit is not present. No edema present. No thyroid mass and no thyromegaly present.  Cardiovascular: S1 normal and S2 normal.  Exam reveals no gallop.   Murmur present  Systolic murmur is present with a grade of 2/6  Pulses:      Dorsalis pedis pulses are 2+ on the right side, and 2+ on the left side.  Respiratory: Diminished breath sounds in bilateral lower lung fields.  GI: Soft. Bowel sounds are normal. There is no tenderness.   Musculoskeletal:       Right ankle: She exhibits swelling.       Left ankle: She exhibits swelling.  Lymphadenopathy:    She has no cervical adenopathy.  Neurological: She is alert and oriented to person, place, and time. No cranial nerve deficit.  Skin: Skin is warm. Nails show no clubbing.  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  Psychiatric: She has a normal mood and affect.      Data Reviewed: Basic Metabolic Panel:  Recent Labs Lab 12/03/15 1736 12/04/15 0351 12/05/15 0520 12/06/15 0500  NA 133* 132* 131* 132*  K 3.8 4.3 3.9 3.8  CL 95* 96* 98* 99*  CO2 33* '27 25 25  '$ GLUCOSE 102* 171* 295* 279*  BUN 26* 23* 21* 24*  CREATININE 0.88 0.86 0.75 0.72  CALCIUM 8.6* 8.5* 8.5* 8.4*   CBC:  Recent Labs Lab 12/03/15 1736 12/04/15 0351 12/05/15 0520 12/05/15 1748 12/06/15 0500  WBC 16.7* 19.3* 22.1* 29.0* 22.8*  NEUTROABS 13.6*  --  19.3*  --   --   HGB 10.5* 10.0* 8.2* 8.6* 7.7*  HCT 31.6* 29.7* 24.6* 25.8* 23.0*  MCV 90.4 89.0 90.0 91.1 89.7  PLT 5* <5* <5* 10* 44*    CBG:  Recent Labs Lab 12/05/15 0805 12/05/15 1150 12/05/15 1639 12/05/15 2136 12/06/15 0634  GLUCAP 246* 266* 248* 279* 269*    Recent Results (from the past 240 hour(s))  Culture, blood (Routine X 2) w Reflex to ID Panel     Status: None  Collection Time: 12/03/15 12:00 AM  Result Value Ref Range Status   Specimen Description BLOOD LEFT ARM  Final   Special Requests BOTTLES DRAWN AEROBIC AND ANAEROBIC 5ML  Final   Culture  Setup Time   Final    GRAM NEGATIVE RODS IN BOTH AEROBIC AND ANAEROBIC BOTTLES CRITICAL RESULT CALLED TO, READ BACK BY AND VERIFIED WITH: JASON ROBBINS AT 2035 12/04/15 MLZ     Culture   Final    PROTEUS MIRABILIS IN BOTH AEROBIC AND ANAEROBIC BOTTLES CONFIRMED BY RW    Report Status 12/06/2015 FINAL  Final   Organism ID, Bacteria PROTEUS MIRABILIS  Final      Susceptibility   Proteus mirabilis - MIC*     AMPICILLIN <=2 SENSITIVE Sensitive     CEFAZOLIN <=4 SENSITIVE Sensitive     CEFEPIME <=1 SENSITIVE Sensitive     CEFTAZIDIME <=1 SENSITIVE Sensitive     CEFTRIAXONE <=1 SENSITIVE Sensitive     CIPROFLOXACIN <=0.25 SENSITIVE Sensitive     GENTAMICIN <=1 SENSITIVE Sensitive     IMIPENEM 2 SENSITIVE Sensitive     TRIMETH/SULFA <=20 SENSITIVE Sensitive     AMPICILLIN/SULBACTAM <=2 SENSITIVE Sensitive     PIP/TAZO <=4 SENSITIVE Sensitive     * PROTEUS MIRABILIS  Blood Culture ID Panel (Reflexed)     Status: Abnormal   Collection Time: 12/03/15 12:00 AM  Result Value Ref Range Status   Enterococcus species NOT DETECTED NOT DETECTED Final   Vancomycin resistance NOT DETECTED NOT DETECTED Final   Listeria monocytogenes NOT DETECTED NOT DETECTED Final   Staphylococcus species NOT DETECTED NOT DETECTED Final   Staphylococcus aureus NOT DETECTED NOT DETECTED Final   Methicillin resistance NOT DETECTED NOT DETECTED Final   Streptococcus species NOT DETECTED NOT DETECTED Final   Streptococcus agalactiae NOT DETECTED NOT DETECTED Final   Streptococcus pneumoniae NOT DETECTED NOT DETECTED Final   Streptococcus pyogenes NOT DETECTED NOT DETECTED Final   Acinetobacter baumannii NOT DETECTED NOT DETECTED Final   Enterobacteriaceae species DETECTED (A) NOT DETECTED Final    Comment: CALLED TO JASON ROBBINS AT 2035 12/04/15 MLZ    Enterobacter cloacae complex NOT DETECTED NOT DETECTED Final   Escherichia coli NOT DETECTED NOT DETECTED Final   Klebsiella oxytoca NOT DETECTED NOT DETECTED Final   Klebsiella pneumoniae NOT DETECTED NOT DETECTED Final   Proteus species DETECTED (A) NOT DETECTED Final    Comment: CALLED TO JASON ROBBINS AT 2035 12/04/15 MLZ    Serratia marcescens NOT DETECTED NOT DETECTED Final   Carbapenem resistance NOT DETECTED NOT DETECTED Final   Haemophilus influenzae NOT DETECTED NOT DETECTED Final   Neisseria meningitidis NOT DETECTED NOT DETECTED Final   Pseudomonas aeruginosa  NOT DETECTED NOT DETECTED Final   Candida albicans NOT DETECTED NOT DETECTED Final   Candida glabrata NOT DETECTED NOT DETECTED Final   Candida krusei NOT DETECTED NOT DETECTED Final   Candida parapsilosis NOT DETECTED NOT DETECTED Final   Candida tropicalis NOT DETECTED NOT DETECTED Final  Urine culture     Status: None   Collection Time: 12/03/15  8:53 PM  Result Value Ref Range Status   Specimen Description URINE, RANDOM  Final   Special Requests NONE  Final   Culture >=100,000 COLONIES/mL PROTEUS MIRABILIS  Final   Report Status 12/06/2015 FINAL  Final   Organism ID, Bacteria PROTEUS MIRABILIS  Final      Susceptibility   Proteus mirabilis - MIC*    AMPICILLIN <=2 SENSITIVE Sensitive     CEFAZOLIN <=  4 SENSITIVE Sensitive     CEFTRIAXONE <=1 SENSITIVE Sensitive     CIPROFLOXACIN <=0.25 SENSITIVE Sensitive     GENTAMICIN <=1 SENSITIVE Sensitive     IMIPENEM 2 SENSITIVE Sensitive     NITROFURANTOIN 128 RESISTANT Resistant     TRIMETH/SULFA <=20 SENSITIVE Sensitive     AMPICILLIN/SULBACTAM <=2 SENSITIVE Sensitive     PIP/TAZO <=4 SENSITIVE Sensitive     * >=100,000 COLONIES/mL PROTEUS MIRABILIS  Culture, blood (Routine X 2) w Reflex to ID Panel     Status: None   Collection Time: 12/03/15 11:45 PM  Result Value Ref Range Status   Specimen Description BLOOD LEFT ARM  Final   Special Requests   Final    BOTTLES DRAWN AEROBIC AND ANAEROBIC 10MLAEROBIC, 5MLANAEROBIC   Culture  Setup Time   Final    GRAM NEGATIVE RODS IN BOTH AEROBIC AND ANAEROBIC BOTTLES CRITICAL RESULT CALLED TO, READ BACK BY AND VERIFIED WITH: JASON ROBBINS AT 2035 12/04/15 MLZ     Culture   Final    PROTEUS MIRABILIS IN BOTH AEROBIC AND ANAEROBIC BOTTLES CONFIRMED BY RW     Report Status 12/06/2015 FINAL  Final   Organism ID, Bacteria PROTEUS MIRABILIS  Final      Susceptibility   Proteus mirabilis - MIC*    AMPICILLIN <=2 SENSITIVE Sensitive     CEFAZOLIN <=4 SENSITIVE Sensitive     CEFEPIME <=1  SENSITIVE Sensitive     CEFTAZIDIME <=1 SENSITIVE Sensitive     CEFTRIAXONE <=1 SENSITIVE Sensitive     CIPROFLOXACIN <=0.25 SENSITIVE Sensitive     GENTAMICIN <=1 SENSITIVE Sensitive     IMIPENEM 2 SENSITIVE Sensitive     TRIMETH/SULFA <=20 SENSITIVE Sensitive     AMPICILLIN/SULBACTAM <=2 SENSITIVE Sensitive     PIP/TAZO <=4 SENSITIVE Sensitive     * PROTEUS MIRABILIS  MRSA PCR Screening     Status: Abnormal   Collection Time: 12/04/15  1:11 AM  Result Value Ref Range Status   MRSA by PCR POSITIVE (A) NEGATIVE Final    Comment:        The GeneXpert MRSA Assay (FDA approved for NASAL specimens only), is one component of a comprehensive MRSA colonization surveillance program. It is not intended to diagnose MRSA infection nor to guide or monitor treatment for MRSA infections. CRITICAL RESULT CALLED TO, READ BACK BY AND VERIFIED WITH: MICHELLE WILLIAMS AT 8756 ON 12/04/15 BY VAB      Studies: No results found.  Scheduled Meds: . sodium chloride   Intravenous Once  . antiseptic oral rinse  7 mL Mouth Rinse BID  . cefTRIAXone (ROCEPHIN)  IV  2 g Intravenous Q24H  . Chlorhexidine Gluconate Cloth  6 each Topical Q0600  . dexamethasone  20 mg Oral Daily  . insulin aspart  0-9 Units Subcutaneous TID WC  . metoprolol tartrate  25 mg Oral BID  . mupirocin ointment  1 application Nasal BID  . QUEtiapine  25 mg Oral QHS  . sodium chloride flush  3 mL Intravenous Q12H  . vancomycin  1,000 mg Intravenous Q12H    Assessment/Plan:  1. 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 Continue IVIG and Decadron. Improved patient will be on prednisone taper  2. 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  Patient currently on IV vancomycin and ceftriaxone. Cultures from blood shows Proteus species. WBC elevated. This also could be related to  gangrene of the toes. Stop  vancomycin  3. Essential hypertension blood pressure control continue metoprolol  4. Type 2 diabetes elevated due to Decadron. Continue to monitor start low-dose Lantus  5. History of atrial fibrillation continue metoprolol no evidence elevated heart rate discontinue telemetry  Code Status:     Code Status Orders        Start     Ordered   12/04/15 0101  Full code   Continuous     12/04/15 0100    Code Status History    Date Active Date Inactive Code Status Order ID Comments User Context   10/31/2015 10:13 PM 11/09/2015  6:03 PM Full Code [id number]  Colleen Can, MD Inpatient   10/30/2015  3:10 PM 10/31/2015 10:13 PM DNR [id number]  Colleen Can, MD Inpatient   10/30/2015  2:18 PM 10/30/2015  3:10 PM DNR [id number]  Colleen Can, MD Inpatient   10/20/2015  5:14 PM 10/30/2015  2:18 PM Full Code [id number]  Fritzi Mandes, MD Inpatient     Disposition Plan: To be determined  Consultants:  Oncology  Antibiotics:  Vancomycin  Unasyn  Time spent: [place], [place] [facility]

## 2015-12-06 NOTE — Progress Notes (Signed)
April [name]   DOB:[birth year]   WP#:[id number]    Subjective: Denies any nosebleeds overnight. Denies any gum bleeding. Platelet transfusion x2 yesterday; pinkish urine noted.   ROS: Had bowel movement this AM; No chest pain or cough.  Objective:  Filed Vitals:   12/06/15 0101 12/06/15 0429  BP: 145/70 158/87  Pulse: 69 59  Temp: 97.5 F (36.4 C) 97.5 F (36.4 C)  Resp: 14 20     Intake/Output Summary (Last 24 hours) at 12/06/15 1005 Last data filed at 12/06/15 0935  Gross per 24 hour  Intake 438.37 ml  Output   2100 ml  Net -1661.63 ml    GENERAL: Well-nourished well-developed; Alert, no distress and comfortable. She is alone. EYES: no pallor or icterus OROPHARYNX: no thrush or ulceration; good dentition; oral mucosa shows petechiae [improving] NECK: supple, no masses felt LYMPH: no palpable lymphadenopathy in the cervical, axillary or inguinal regions LUNGS: Decreased air entry bilaterally No wheeze or crackles HEART/CVS: regular rate & rhythm and no murmurs; bilateral gangrenous extremities noted ABDOMEN: abdomen soft, non-tender and normal bowel sounds Musculoskeletal:no cyanosis of digits and no clubbing  PSYCH: alert & oriented x 3 with fluent speech NEURO: no focal motor/sensory deficits SKIN: Multiple ecchymosis noted.   Labs:  Lab Results  Component Value Date   WBC 22.8* 12/06/2015   HGB 7.7* 12/06/2015   HCT 23.0* 12/06/2015   MCV 89.7 12/06/2015   PLT 44* 12/06/2015   NEUTROABS 19.3* 12/05/2015    Lab Results  Component Value Date   NA 132* 12/06/2015   K 3.8 12/06/2015   CL 99* 12/06/2015   CO2 25 12/06/2015    Studies:  No results found.  Assessment & Plan:   # Severe thrombocytopenia symptomatic bleeding. Unclear etiology. 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 Heparin-induced thrombocytopenia- does not cause such severe thrombocytopenia; and also should not cause bleeding; [ LAST Admission-JAN 2017 Serotonin release  assay was negative; although HIT panel was mildly positive]  # Continue steroids [3/10-dexamethasone 20 mg] by mouth daily 4 days; s/p IVIG [x2- finished 3/11]. S/p 2 units of platelets yesterday;  Today platelets- 44. Will continue dex '20mg'$  for now;  # Bacteremia- on Anti-biotics per primary team.  # Discussed with Dr.Patel.   Cammie Sickle, MD 12/06/2015  10:05 AM

## 2015-12-07 ENCOUNTER — Encounter
Admission: RE | Admit: 2015-12-07 | Discharge: 2015-12-07 | Disposition: A | Discharge status: Home / Self Care | Payer: Medicare HMO | Source: Ambulatory Visit | Attending: Internal Medicine | Admitting: Internal Medicine

## 2015-12-07 DIAGNOSIS — Z794 Long term (current) use of insulin: Secondary | ICD-10-CM | POA: Insufficient documentation

## 2015-12-07 DIAGNOSIS — D696 Thrombocytopenia, unspecified: Secondary | ICD-10-CM | POA: Insufficient documentation

## 2015-12-07 DIAGNOSIS — E119 Type 2 diabetes mellitus without complications: Secondary | ICD-10-CM | POA: Insufficient documentation

## 2015-12-07 LAB — CBC WITH DIFFERENTIAL/PLATELET
BAND NEUTROPHILS: 0 %
BASOS PCT: 0 %
Basophils Absolute: 0 10*3/uL (ref 0–0.1)
Blasts: 0 %
EOS ABS: 0 10*3/uL (ref 0–0.7)
EOS PCT: 0 %
HCT: 26.8 % — ABNORMAL LOW (ref 35.0–47.0)
HEMOGLOBIN: 9.1 g/dL — AB (ref 12.0–16.0)
LYMPHS ABS: 1.8 10*3/uL (ref 1.0–3.6)
Lymphocytes Relative: 7 %
MCH: 30.4 pg (ref 26.0–34.0)
MCHC: 33.9 g/dL (ref 32.0–36.0)
MCV: 89.6 fL (ref 80.0–100.0)
METAMYELOCYTES PCT: 0 %
MONO ABS: 1.3 10*3/uL — AB (ref 0.2–0.9)
MYELOCYTES: 0 %
Monocytes Relative: 5 %
NRBC: 0 /100{WBCs}
Neutro Abs: 22.5 10*3/uL — ABNORMAL HIGH (ref 1.4–6.5)
Neutrophils Relative %: 88 %
Other: 0 %
PLATELETS: 66 10*3/uL — AB (ref 150–440)
PROMYELOCYTES ABS: 0 %
RBC: 2.99 MIL/uL — ABNORMAL LOW (ref 3.80–5.20)
RDW: 15.5 % — ABNORMAL HIGH (ref 11.5–14.5)
WBC: 25.6 10*3/uL — ABNORMAL HIGH (ref 3.6–11.0)

## 2015-12-07 LAB — IGG, IGA, IGM
IGA: 528 mg/dL — AB (ref 87–352)
IGM, SERUM: 170 mg/dL (ref 26–217)
IgG (Immunoglobin G), Serum: 1423 mg/dL (ref 700–1600)

## 2015-12-07 LAB — FANA STAINING PATTERNS: Homogeneous Pattern: 1:80 {titer}

## 2015-12-07 LAB — GLUCOSE, CAPILLARY
GLUCOSE-CAPILLARY: 241 mg/dL — AB (ref 65–99)
GLUCOSE-CAPILLARY: 271 mg/dL — AB (ref 65–99)
GLUCOSE-CAPILLARY: 248 mg/dL — AB (ref 65–99)
GLUCOSE-CAPILLARY: 191 mg/dL — AB (ref 65–99)

## 2015-12-07 LAB — ANTINUCLEAR ANTIBODIES, IFA: ANA Ab, IFA: POSITIVE — AB

## 2015-12-07 NOTE — Progress Notes (Signed)
Inpatient Diabetes Program Recommendations  AACE/ADA: New Consensus Statement on Inpatient Glycemic Control (2015)  Target Ranges:  Prepandial:   less than 140 mg/dL      Peak postprandial:   less than 180 mg/dL (1-2 hours)      Critically ill patients:  140 - 180 mg/dL   Review of Glycemic Control:  Results for LEONARDA, LEIS (MRN [id number]) as of 12/07/2015 11:54  Ref. Range 12/06/2015 06:34 12/06/2015 11:12 12/06/2015 16:31 12/06/2015 21:07 12/07/2015 07:58  Glucose-Capillary Latest Ref Range: 65-99 mg/dL 269 (H) 269 (H) 231 (H) 218 (H) 241 (H)   Diabetes history: Type 2 diabetes Outpatient Diabetes medications: Lantus 15 units q HS. Current orders for Inpatient glycemic control:  Novolog sensitive tid with meals  Inpatient Diabetes Program Recommendations:    May consider restarting Lantus 15 units q HS while patient is in the hospital.  Thanks, Adah Perl, RN, BC-ADM Inpatient Diabetes Coordinator Pager [phone] (8a-5p)

## 2015-12-07 NOTE — Care Management Important Message (Signed)
Important Message  Patient Details  Name: April [name] MRN: [id number] Date of Birth: December 05, 1945   Medicare Important Message Given:  Yes    Beverly Sessions, RN 12/07/2015, 9:49 AM

## 2015-12-07 NOTE — Clinical Social Work Note (Signed)
Patient has had two additional bed offers: [facility] and [facility]. CSW has extended these offers to patient and she is going to decide by this afternoon. Patient tells CSW that she was not unhappy with [facility] but that the PT had her use weights with her exercises and that this caused soreness in her hips and she does not like using weights. CSW attempted to explain that this may be the recommendation from any of the PT's at any of the facilities she may go to. Shela Leff MSW,LCSW [phone]

## 2015-12-07 NOTE — Progress Notes (Signed)
April [name]   DOB:02-21-1946   JS#:[id number]    Subjective: Had slept well.  Denies any nosebleeds overnight. Denies any gum bleeding. Platelet transfusion x2 [3/11].   ROS:  No chest pain or cough.  Objective:  Filed Vitals:   12/07/15 0408 12/07/15 0603  BP: 169/75 167/72  Pulse: 58 58  Temp: 97.6 F (36.4 C) 97.5 F (36.4 C)  Resp: 20 20     Intake/Output Summary (Last 24 hours) at 12/07/15 0828 Last data filed at 12/07/15 0718  Gross per 24 hour  Intake    170 ml  Output   3400 ml  Net  -3230 ml    GENERAL: Well-nourished well-developed; Alert, no distress and comfortable. She is alone. EYES: no pallor or icterus OROPHARYNX: no thrush or ulceration; good dentition; oral mucosa shows petechiae [improving] NECK: supple, no masses felt LYMPH: no palpable lymphadenopathy in the cervical, axillary or inguinal regions LUNGS: Decreased air entry bilaterally No wheeze or crackles HEART/CVS: regular rate & rhythm and no murmurs; bilateral gangrenous extremities noted ABDOMEN: abdomen soft, non-tender and normal bowel sounds Musculoskeletal:no cyanosis of digits and no clubbing  PSYCH: alert & oriented x 3 with fluent speech NEURO: no focal motor/sensory deficits SKIN: Multiple ecchymosis noted.   Labs:  Lab Results  Component Value Date   WBC 25.6* 12/07/2015   HGB 9.1* 12/07/2015   HCT 26.8* 12/07/2015   MCV 89.6 12/07/2015   PLT 66* 12/07/2015   NEUTROABS PENDING 12/07/2015    Lab Results  Component Value Date   NA 132* 12/06/2015   K 3.8 12/06/2015   CL 99* 12/06/2015   CO2 25 12/06/2015    Studies:  No results found.  Assessment & Plan:   # Severe thrombocytopenia symptomatic bleeding. Unclear etiology. 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 Heparin-induced thrombocytopenia- does not cause such severe thrombocytopenia; and also should not cause bleeding; [ LAST Admission-JAN 2017 Serotonin release assay was negative; although HIT  panel was mildly positive]  # Steroids [3/10-dexamethasone 20 mg] by mouth daily 3 days; s/p IVIG [x2- finished 3/11]. S/p 2 units of platelets on 3/11. Platelets- 3/12- 44; today- platelets- 66. HOLD further steroids [especially in view of bacteremia]; and as platelets are going up. Dexamethasone is discontinued today. Will monitor platelets.   # Bacteremia- on Anti-biotics per primary team.   Cammie Sickle, MD 12/07/2015  8:28 AM

## 2015-12-07 NOTE — Progress Notes (Addendum)
Patient ID: April [name], female   DOB: 1945/11/02, 70 y.o.   MRN: [id number] [facility] PROGRESS NOTE  April [name] KDT:[id number] DOB: 1946-04-18 DOA: 12/03/2015 PCP: Albina Billet, MD  HPI/Subjective:  Patient was noted to have some passage of clots and bright red blood per per vagina per nurse. Patient continues to complain of some constipation. No chest pain or shortness of breath   Objective: Filed Vitals:   12/07/15 0603 12/07/15 1013  BP: 167/72 143/74  Pulse: 58 67  Temp: 97.5 F (36.4 C)   Resp: 20     Filed Weights   12/03/15 1733 12/04/15 0100  Weight: 113.853 kg (251 lb) 102.7 kg (226 lb 6.6 oz)    ROS: Review of Systems  Constitutional: Negative for fever and chills.  Eyes: Negative for blurred vision.  Respiratory: Positive for cough. Negative for shortness of breath.   Cardiovascular: Negative for chest pain.  Gastrointestinal: Constipationt. Negative for nausea, vomiting and diarrhea.  Genitourinary: Negative for dysuria.  Musculoskeletal: Negative for joint pain.  Neurological: Positive for weakness. Negative for dizziness and headaches.  Endo/Heme/Allergies: Bruises/bleeds easily.   Exam: Physical Exam  Constitutional: She is oriented to person, place, and time.  HENT:  Nose: No mucosal edema.  Mouth/Throat: No oropharyngeal exudate or posterior oropharyngeal edema.  Eyes: Conjunctivae, EOM and lids are normal. Pupils are equal, round, and reactive to light.  Neck: No JVD present. Carotid bruit is not present. No edema present. No thyroid mass and no thyromegaly present.  Cardiovascular: S1 normal and S2 normal.  Exam reveals no gallop.   Murmur present  Systolic murmur is present with a grade of 2/6  Pulses:      Dorsalis pedis pulses are 2+ on the right side, and 2+ on the left side.  Respiratory: Diminished breath sounds in bilateral lower lung fields.  GI: Soft. Bowel sounds are normal. There is no tenderness.  Musculoskeletal:        Right ankle: She exhibits swelling.       Left ankle: She exhibits swelling.  Lymphadenopathy:    She has no cervical adenopathy.  Neurological: She is alert and oriented to person, place, and time. No cranial nerve deficit.  Skin: Skin is warm. Nails show no clubbing.  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  Psychiatric: She has a normal mood and affect.      Data Reviewed: Basic Metabolic Panel:  Recent Labs Lab 12/03/15 1736 12/04/15 0351 12/05/15 0520 12/06/15 0500  NA 133* 132* 131* 132*  K 3.8 4.3 3.9 3.8  CL 95* 96* 98* 99*  CO2 33* '27 25 25  '$ GLUCOSE 102* 171* 295* 279*  BUN 26* 23* 21* 24*  CREATININE 0.88 0.86 0.75 0.72  CALCIUM 8.6* 8.5* 8.5* 8.4*   CBC:  Recent Labs Lab 12/03/15 1736 12/04/15 0351 12/05/15 0520 12/05/15 1748 12/06/15 0500 12/07/15 0750  WBC 16.7* 19.3* 22.1* 29.0* 22.8* 25.6*  NEUTROABS 13.6*  --  19.3*  --   --  22.5*  HGB 10.5* 10.0* 8.2* 8.6* 7.7* 9.1*  HCT 31.6* 29.7* 24.6* 25.8* 23.0* 26.8*  MCV 90.4 89.0 90.0 91.1 89.7 89.6  PLT 5* <5* <5* 10* 44* 66*    CBG:  Recent Labs Lab 12/06/15 1112 12/06/15 1631 12/06/15 2107 12/07/15 0758 12/07/15 1208  GLUCAP 269* 231* 218* 241* 271*    Recent Results (from the past 240 hour(s))  Culture, blood (Routine X  2) w Reflex to ID Panel     Status: None   Collection Time: 12/03/15 12:00 AM  Result Value Ref Range Status   Specimen Description BLOOD LEFT ARM  Final   Special Requests BOTTLES DRAWN AEROBIC AND ANAEROBIC 5ML  Final   Culture  Setup Time   Final    GRAM NEGATIVE RODS IN BOTH AEROBIC AND ANAEROBIC BOTTLES CRITICAL RESULT CALLED TO, READ BACK BY AND VERIFIED WITH: JASON ROBBINS AT 2035 12/04/15 MLZ     Culture   Final    PROTEUS MIRABILIS IN BOTH AEROBIC AND ANAEROBIC BOTTLES CONFIRMED BY RW    Report Status 12/06/2015 FINAL  Final   Organism ID, Bacteria PROTEUS MIRABILIS  Final      Susceptibility   Proteus  mirabilis - MIC*    AMPICILLIN <=2 SENSITIVE Sensitive     CEFAZOLIN <=4 SENSITIVE Sensitive     CEFEPIME <=1 SENSITIVE Sensitive     CEFTAZIDIME <=1 SENSITIVE Sensitive     CEFTRIAXONE <=1 SENSITIVE Sensitive     CIPROFLOXACIN <=0.25 SENSITIVE Sensitive     GENTAMICIN <=1 SENSITIVE Sensitive     IMIPENEM 2 SENSITIVE Sensitive     TRIMETH/SULFA <=20 SENSITIVE Sensitive     AMPICILLIN/SULBACTAM <=2 SENSITIVE Sensitive     PIP/TAZO <=4 SENSITIVE Sensitive     * PROTEUS MIRABILIS  Blood Culture ID Panel (Reflexed)     Status: Abnormal   Collection Time: 12/03/15 12:00 AM  Result Value Ref Range Status   Enterococcus species NOT DETECTED NOT DETECTED Final   Vancomycin resistance NOT DETECTED NOT DETECTED Final   Listeria monocytogenes NOT DETECTED NOT DETECTED Final   Staphylococcus species NOT DETECTED NOT DETECTED Final   Staphylococcus aureus NOT DETECTED NOT DETECTED Final   Methicillin resistance NOT DETECTED NOT DETECTED Final   Streptococcus species NOT DETECTED NOT DETECTED Final   Streptococcus agalactiae NOT DETECTED NOT DETECTED Final   Streptococcus pneumoniae NOT DETECTED NOT DETECTED Final   Streptococcus pyogenes NOT DETECTED NOT DETECTED Final   Acinetobacter baumannii NOT DETECTED NOT DETECTED Final   Enterobacteriaceae species DETECTED (A) NOT DETECTED Final    Comment: CALLED TO JASON ROBBINS AT 2035 12/04/15 MLZ    Enterobacter cloacae complex NOT DETECTED NOT DETECTED Final   Escherichia coli NOT DETECTED NOT DETECTED Final   Klebsiella oxytoca NOT DETECTED NOT DETECTED Final   Klebsiella pneumoniae NOT DETECTED NOT DETECTED Final   Proteus species DETECTED (A) NOT DETECTED Final    Comment: CALLED TO JASON ROBBINS AT 2035 12/04/15 MLZ    Serratia marcescens NOT DETECTED NOT DETECTED Final   Carbapenem resistance NOT DETECTED NOT DETECTED Final   Haemophilus influenzae NOT DETECTED NOT DETECTED Final   Neisseria meningitidis NOT DETECTED NOT DETECTED Final    Pseudomonas aeruginosa NOT DETECTED NOT DETECTED Final   Candida albicans NOT DETECTED NOT DETECTED Final   Candida glabrata NOT DETECTED NOT DETECTED Final   Candida krusei NOT DETECTED NOT DETECTED Final   Candida parapsilosis NOT DETECTED NOT DETECTED Final   Candida tropicalis NOT DETECTED NOT DETECTED Final  Urine culture     Status: None   Collection Time: 12/03/15  8:53 PM  Result Value Ref Range Status   Specimen Description URINE, RANDOM  Final   Special Requests NONE  Final   Culture >=100,000 COLONIES/mL PROTEUS MIRABILIS  Final   Report Status 12/06/2015 FINAL  Final   Organism ID, Bacteria PROTEUS MIRABILIS  Final      Susceptibility   Proteus mirabilis -  MIC*    AMPICILLIN <=2 SENSITIVE Sensitive     CEFAZOLIN <=4 SENSITIVE Sensitive     CEFTRIAXONE <=1 SENSITIVE Sensitive     CIPROFLOXACIN <=0.25 SENSITIVE Sensitive     GENTAMICIN <=1 SENSITIVE Sensitive     IMIPENEM 2 SENSITIVE Sensitive     NITROFURANTOIN 128 RESISTANT Resistant     TRIMETH/SULFA <=20 SENSITIVE Sensitive     AMPICILLIN/SULBACTAM <=2 SENSITIVE Sensitive     PIP/TAZO <=4 SENSITIVE Sensitive     * >=100,000 COLONIES/mL PROTEUS MIRABILIS  Culture, blood (Routine X 2) w Reflex to ID Panel     Status: None   Collection Time: 12/03/15 11:45 PM  Result Value Ref Range Status   Specimen Description BLOOD LEFT ARM  Final   Special Requests   Final    BOTTLES DRAWN AEROBIC AND ANAEROBIC 10MLAEROBIC, 5MLANAEROBIC   Culture  Setup Time   Final    GRAM NEGATIVE RODS IN BOTH AEROBIC AND ANAEROBIC BOTTLES CRITICAL RESULT CALLED TO, READ BACK BY AND VERIFIED WITH: JASON ROBBINS AT 2035 12/04/15 MLZ     Culture   Final    PROTEUS MIRABILIS IN BOTH AEROBIC AND ANAEROBIC BOTTLES CONFIRMED BY RW     Report Status 12/06/2015 FINAL  Final   Organism ID, Bacteria PROTEUS MIRABILIS  Final      Susceptibility   Proteus mirabilis - MIC*    AMPICILLIN <=2 SENSITIVE Sensitive     CEFAZOLIN <=4 SENSITIVE Sensitive      CEFEPIME <=1 SENSITIVE Sensitive     CEFTAZIDIME <=1 SENSITIVE Sensitive     CEFTRIAXONE <=1 SENSITIVE Sensitive     CIPROFLOXACIN <=0.25 SENSITIVE Sensitive     GENTAMICIN <=1 SENSITIVE Sensitive     IMIPENEM 2 SENSITIVE Sensitive     TRIMETH/SULFA <=20 SENSITIVE Sensitive     AMPICILLIN/SULBACTAM <=2 SENSITIVE Sensitive     PIP/TAZO <=4 SENSITIVE Sensitive     * PROTEUS MIRABILIS  MRSA PCR Screening     Status: Abnormal   Collection Time: 12/04/15  1:11 AM  Result Value Ref Range Status   MRSA by PCR POSITIVE (A) NEGATIVE Final    Comment:        The GeneXpert MRSA Assay (FDA approved for NASAL specimens only), is one component of a comprehensive MRSA colonization surveillance program. It is not intended to diagnose MRSA infection nor to guide or monitor treatment for MRSA infections. CRITICAL RESULT CALLED TO, READ BACK BY AND VERIFIED WITH: MICHELLE WILLIAMS AT 4098 ON 12/04/15 BY VAB      Studies: No results found.  Scheduled Meds: . sodium chloride   Intravenous Once  . antiseptic oral rinse  7 mL Mouth Rinse BID  . cefTRIAXone (ROCEPHIN)  IV  2 g Intravenous Q24H  . Chlorhexidine Gluconate Cloth  6 each Topical Q0600  . insulin aspart  0-9 Units Subcutaneous TID WC  . metoprolol tartrate  25 mg Oral BID  . mupirocin ointment  1 application Nasal BID  . QUEtiapine  25 mg Oral QHS  . sodium chloride flush  3 mL Intravenous Q12H    Assessment/Plan:  1. 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 IV Solu-Medrol has been discontinued has finished course of IVIG her platelet counts are improving 2. 2. Sepsis due to Proteus source urinary tract infection continue ceftriaxone and will load of 14 days of antibiotics  3. Essential hypertension blood pressure control continue metoprolol  4. Type 2 diabetes elevated due to Decadron. Continue to monitor start low-dose Lantus  5. History of atrial fibrillation continue metoprolol  6. Anemia possibly anemia of  chronic disease continue to monitor 7. Bleeding from her vaginal canal at this point will monitor her for time being in light of her severe thrombocytopenia recently. Could be old blood. If persists will need GYN evaluation  Code Status:     Code Status Orders        Start     Ordered   12/04/15 0101  Full code   Continuous     12/04/15 0100    Code Status History    Date Active Date Inactive Code Status Order ID Comments User Context   10/31/2015 10:13 PM 11/09/2015  6:03 PM Full Code [id number]  Colleen Can, MD Inpatient   10/30/2015  3:10 PM 10/31/2015 10:13 PM DNR [id number]  Colleen Can, MD Inpatient   10/30/2015  2:18 PM 10/30/2015  3:10 PM DNR [id number]  Colleen Can, MD Inpatient   10/20/2015  5:14 PM 10/30/2015  2:18 PM Full Code [id number]  Fritzi Mandes, MD Inpatient     Disposition Plan: To be determined  Consultants:  Oncology  Antibiotics:  Vancomycin  Unasyn  Time spent: 25 minutes   [place], [place] [facility]

## 2015-12-07 NOTE — Progress Notes (Signed)
Notified Dr. Posey Pronto that patient is still experiencing blood in the urine and it appeared that she had blood clots coming from her vagina. No new orders received will continue to monitor.

## 2015-12-08 ENCOUNTER — Telehealth: Payer: Self-pay | Admitting: Urology

## 2015-12-08 ENCOUNTER — Ambulatory Visit: Payer: Self-pay | Admitting: Urology

## 2015-12-08 ENCOUNTER — Encounter: Payer: Self-pay | Admitting: Urology

## 2015-12-08 LAB — CBC WITH DIFFERENTIAL/PLATELET
BASOS ABS: 0 10*3/uL (ref 0–0.1)
BLASTS: 0 %
Band Neutrophils: 0 %
Basophils Relative: 0 %
EOS PCT: 0 %
Eosinophils Absolute: 0 10*3/uL (ref 0–0.7)
HCT: 27.9 % — ABNORMAL LOW (ref 35.0–47.0)
Hemoglobin: 9.6 g/dL — ABNORMAL LOW (ref 12.0–16.0)
LYMPHS ABS: 1.8 10*3/uL (ref 1.0–3.6)
Lymphocytes Relative: 7 %
MCH: 30.4 pg (ref 26.0–34.0)
MCHC: 34.4 g/dL (ref 32.0–36.0)
MCV: 88.4 fL (ref 80.0–100.0)
METAMYELOCYTES PCT: 2 %
MONOS PCT: 9 %
MYELOCYTES: 2 %
Monocytes Absolute: 2.3 10*3/uL — ABNORMAL HIGH (ref 0.2–0.9)
NEUTROS PCT: 80 %
NRBC: 1 /100{WBCs} — AB
Neutro Abs: 21.8 10*3/uL — ABNORMAL HIGH (ref 1.4–6.5)
Other: 0 %
Platelets: 82 10*3/uL — ABNORMAL LOW (ref 150–440)
Promyelocytes Absolute: 0 %
RBC: 3.15 MIL/uL — AB (ref 3.80–5.20)
RDW: 15.2 % — AB (ref 11.5–14.5)
WBC: 25.9 10*3/uL — AB (ref 3.6–11.0)

## 2015-12-08 LAB — BASIC METABOLIC PANEL
Anion gap: 6 (ref 5–15)
BUN: 29 mg/dL — AB (ref 6–20)
CALCIUM: 8.5 mg/dL — AB (ref 8.9–10.3)
CHLORIDE: 99 mmol/L — AB (ref 101–111)
CO2: 26 mmol/L (ref 22–32)
CREATININE: 0.62 mg/dL (ref 0.44–1.00)
GFR calc non Af Amer: >60 mL/min (ref >60)
Glucose, Bld: 160 mg/dL — ABNORMAL HIGH (ref 65–99)
Potassium: 3.9 mmol/L (ref 3.5–5.1)
SODIUM: 131 mmol/L — AB (ref 135–145)

## 2015-12-08 LAB — GLUCOSE, CAPILLARY
GLUCOSE-CAPILLARY: 127 mg/dL — AB (ref 65–99)
GLUCOSE-CAPILLARY: 110 mg/dL — AB (ref 65–99)
GLUCOSE-CAPILLARY: 145 mg/dL — AB (ref 65–99)
Glucose-Capillary: 128 mg/dL — ABNORMAL HIGH (ref 65–99)

## 2015-12-08 LAB — PATHOLOGIST SMEAR REVIEW: PATH REVIEW: ADEQUATE

## 2015-12-08 NOTE — Clinical Social Work Note (Signed)
Patient has had additional bed offers and she has chosen [facility]. PT information fowarded to [facility] for them to work on prior [place].  Shela Leff MSW,LCSW [phone]

## 2015-12-08 NOTE — Plan of Care (Signed)
Problem: Activity: Goal: Risk for activity intolerance will decrease Outcome: Not Progressing Patient on bedrest.

## 2015-12-08 NOTE — Progress Notes (Addendum)
Patient ID: April [name], female   DOB: 01-21-1946, 70 y.o.   MRN: [id number] [facility] PROGRESS NOTE  April [name] QQI:[id number] DOB: [birth year] DOA: 12/03/2015 PCP: Albina Billet, MD  HPI/Subjective:  No further bleeding noted. Patient's WBC count continues to be elevated of fever Objective: Filed Vitals:   12/08/15 0413 12/08/15 1231  BP: 157/76 135/73  Pulse: 59 65  Temp: 97.5 F (36.4 C) 97.5 F (36.4 C)  Resp: 20 16    Filed Weights   12/03/15 1733 12/04/15 0100  Weight: 113.853 kg (251 lb) 102.7 kg (226 lb 6.6 oz)    ROS: Review of Systems  Constitutional: Negative for fever and chills.  Eyes: Negative for blurred vision.  Respiratory: Positive for cough. Negative for shortness of breath.   Cardiovascular: Negative for chest pain.  Gastrointestinal: Constipationt. Negative for nausea, vomiting and diarrhea.  Genitourinary: Negative for dysuria.  Musculoskeletal: Negative for joint pain.  Neurological: Positive for weakness. Negative for dizziness and headaches.  Endo/Heme/Allergies: Bruises/bleeds easily.   Exam: Physical Exam  Constitutional: She is oriented to person, place, and time.  HENT:  Nose: No mucosal edema.  Mouth/Throat: No oropharyngeal exudate or posterior oropharyngeal edema.  Eyes: Conjunctivae, EOM and lids are normal. Pupils are equal, round, and reactive to light.  Neck: No JVD present. Carotid bruit is not present. No edema present. No thyroid mass and no thyromegaly present.  Cardiovascular: S1 normal and S2 normal.  Exam reveals no gallop.   Murmur present  Systolic murmur is present with a grade of 2/6  Pulses:      Dorsalis pedis pulses are 2+ on the right side, and 2+ on the left side.  Respiratory: Diminished breath sounds in bilateral lower lung fields.  GI: Soft. Bowel sounds are normal. There is no tenderness.  Musculoskeletal:       Right ankle: She exhibits swelling.       Left ankle: She exhibits swelling.   Lymphadenopathy:    She has no cervical adenopathy.  Neurological: She is alert and oriented to person, place, and time. No cranial nerve deficit.  Skin: Skin is warm. Nails show no clubbing.  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  Psychiatric: She has a normal mood and affect.      Data Reviewed: Basic Metabolic Panel:  Recent Labs Lab 12/03/15 1736 12/04/15 0351 12/05/15 0520 12/06/15 0500 12/08/15 0400  NA 133* 132* 131* 132* 131*  K 3.8 4.3 3.9 3.8 3.9  CL 95* 96* 98* 99* 99*  CO2 33* '27 25 25 26  '$ GLUCOSE 102* 171* 295* 279* 160*  BUN 26* 23* 21* 24* 29*  CREATININE 0.88 0.86 0.75 0.72 0.62  CALCIUM 8.6* 8.5* 8.5* 8.4* 8.5*   CBC:  Recent Labs Lab 12/03/15 1736  12/05/15 0520 12/05/15 1748 12/06/15 0500 12/07/15 0750 12/08/15 0400  WBC 16.7*  < > 22.1* 29.0* 22.8* 25.6* 25.9*  NEUTROABS 13.6*  --  19.3*  --   --  22.5* 21.8*  HGB 10.5*  < > 8.2* 8.6* 7.7* 9.1* 9.6*  HCT 31.6*  < > 24.6* 25.8* 23.0* 26.8* 27.9*  MCV 90.4  < > 90.0 91.1 89.7 89.6 88.4  PLT 5*  < > <5* 10* 44* 66* 82*  < > = values in this interval not displayed.  CBG:  Recent Labs Lab 12/07/15 1208 12/07/15 1700 12/07/15 2118 12/08/15 0738 12/08/15 1117  GLUCAP 271* 248* 191*  127* 128*    Recent Results (from the past 240 hour(s))  Culture, blood (Routine X 2) w Reflex to ID Panel     Status: None   Collection Time: 12/03/15 12:00 AM  Result Value Ref Range Status   Specimen Description BLOOD LEFT ARM  Final   Special Requests BOTTLES DRAWN AEROBIC AND ANAEROBIC 5ML  Final   Culture  Setup Time   Final    GRAM NEGATIVE RODS IN BOTH AEROBIC AND ANAEROBIC BOTTLES CRITICAL RESULT CALLED TO, READ BACK BY AND VERIFIED WITH: JASON ROBBINS AT 2035 12/04/15 MLZ     Culture   Final    PROTEUS MIRABILIS IN BOTH AEROBIC AND ANAEROBIC BOTTLES CONFIRMED BY RW    Report Status 12/06/2015 FINAL  Final   Organism ID, Bacteria PROTEUS MIRABILIS   Final      Susceptibility   Proteus mirabilis - MIC*    AMPICILLIN <=2 SENSITIVE Sensitive     CEFAZOLIN <=4 SENSITIVE Sensitive     CEFEPIME <=1 SENSITIVE Sensitive     CEFTAZIDIME <=1 SENSITIVE Sensitive     CEFTRIAXONE <=1 SENSITIVE Sensitive     CIPROFLOXACIN <=0.25 SENSITIVE Sensitive     GENTAMICIN <=1 SENSITIVE Sensitive     IMIPENEM 2 SENSITIVE Sensitive     TRIMETH/SULFA <=20 SENSITIVE Sensitive     AMPICILLIN/SULBACTAM <=2 SENSITIVE Sensitive     PIP/TAZO <=4 SENSITIVE Sensitive     * PROTEUS MIRABILIS  Blood Culture ID Panel (Reflexed)     Status: Abnormal   Collection Time: 12/03/15 12:00 AM  Result Value Ref Range Status   Enterococcus species NOT DETECTED NOT DETECTED Final   Vancomycin resistance NOT DETECTED NOT DETECTED Final   Listeria monocytogenes NOT DETECTED NOT DETECTED Final   Staphylococcus species NOT DETECTED NOT DETECTED Final   Staphylococcus aureus NOT DETECTED NOT DETECTED Final   Methicillin resistance NOT DETECTED NOT DETECTED Final   Streptococcus species NOT DETECTED NOT DETECTED Final   Streptococcus agalactiae NOT DETECTED NOT DETECTED Final   Streptococcus pneumoniae NOT DETECTED NOT DETECTED Final   Streptococcus pyogenes NOT DETECTED NOT DETECTED Final   Acinetobacter baumannii NOT DETECTED NOT DETECTED Final   Enterobacteriaceae species DETECTED (A) NOT DETECTED Final    Comment: CALLED TO JASON ROBBINS AT 2035 12/04/15 MLZ    Enterobacter cloacae complex NOT DETECTED NOT DETECTED Final   Escherichia coli NOT DETECTED NOT DETECTED Final   Klebsiella oxytoca NOT DETECTED NOT DETECTED Final   Klebsiella pneumoniae NOT DETECTED NOT DETECTED Final   Proteus species DETECTED (A) NOT DETECTED Final    Comment: CALLED TO JASON ROBBINS AT 2035 12/04/15 MLZ    Serratia marcescens NOT DETECTED NOT DETECTED Final   Carbapenem resistance NOT DETECTED NOT DETECTED Final   Haemophilus influenzae NOT DETECTED NOT DETECTED Final   Neisseria meningitidis  NOT DETECTED NOT DETECTED Final   Pseudomonas aeruginosa NOT DETECTED NOT DETECTED Final   Candida albicans NOT DETECTED NOT DETECTED Final   Candida glabrata NOT DETECTED NOT DETECTED Final   Candida krusei NOT DETECTED NOT DETECTED Final   Candida parapsilosis NOT DETECTED NOT DETECTED Final   Candida tropicalis NOT DETECTED NOT DETECTED Final  Urine culture     Status: None   Collection Time: 12/03/15  8:53 PM  Result Value Ref Range Status   Specimen Description URINE, RANDOM  Final   Special Requests NONE  Final   Culture >=100,000 COLONIES/mL PROTEUS MIRABILIS  Final   Report Status 12/06/2015 FINAL  Final  Organism ID, Bacteria PROTEUS MIRABILIS  Final      Susceptibility   Proteus mirabilis - MIC*    AMPICILLIN <=2 SENSITIVE Sensitive     CEFAZOLIN <=4 SENSITIVE Sensitive     CEFTRIAXONE <=1 SENSITIVE Sensitive     CIPROFLOXACIN <=0.25 SENSITIVE Sensitive     GENTAMICIN <=1 SENSITIVE Sensitive     IMIPENEM 2 SENSITIVE Sensitive     NITROFURANTOIN 128 RESISTANT Resistant     TRIMETH/SULFA <=20 SENSITIVE Sensitive     AMPICILLIN/SULBACTAM <=2 SENSITIVE Sensitive     PIP/TAZO <=4 SENSITIVE Sensitive     * >=100,000 COLONIES/mL PROTEUS MIRABILIS  Culture, blood (Routine X 2) w Reflex to ID Panel     Status: None   Collection Time: 12/03/15 11:45 PM  Result Value Ref Range Status   Specimen Description BLOOD LEFT ARM  Final   Special Requests   Final    BOTTLES DRAWN AEROBIC AND ANAEROBIC 10MLAEROBIC, 5MLANAEROBIC   Culture  Setup Time   Final    GRAM NEGATIVE RODS IN BOTH AEROBIC AND ANAEROBIC BOTTLES CRITICAL RESULT CALLED TO, READ BACK BY AND VERIFIED WITH: JASON ROBBINS AT 2035 12/04/15 MLZ     Culture   Final    PROTEUS MIRABILIS IN BOTH AEROBIC AND ANAEROBIC BOTTLES CONFIRMED BY RW     Report Status 12/06/2015 FINAL  Final   Organism ID, Bacteria PROTEUS MIRABILIS  Final      Susceptibility   Proteus mirabilis - MIC*    AMPICILLIN <=2 SENSITIVE Sensitive      CEFAZOLIN <=4 SENSITIVE Sensitive     CEFEPIME <=1 SENSITIVE Sensitive     CEFTAZIDIME <=1 SENSITIVE Sensitive     CEFTRIAXONE <=1 SENSITIVE Sensitive     CIPROFLOXACIN <=0.25 SENSITIVE Sensitive     GENTAMICIN <=1 SENSITIVE Sensitive     IMIPENEM 2 SENSITIVE Sensitive     TRIMETH/SULFA <=20 SENSITIVE Sensitive     AMPICILLIN/SULBACTAM <=2 SENSITIVE Sensitive     PIP/TAZO <=4 SENSITIVE Sensitive     * PROTEUS MIRABILIS  MRSA PCR Screening     Status: Abnormal   Collection Time: 12/04/15  1:11 AM  Result Value Ref Range Status   MRSA by PCR POSITIVE (A) NEGATIVE Final    Comment:        The GeneXpert MRSA Assay (FDA approved for NASAL specimens only), is one component of a comprehensive MRSA colonization surveillance program. It is not intended to diagnose MRSA infection nor to guide or monitor treatment for MRSA infections. CRITICAL RESULT CALLED TO, READ BACK BY AND VERIFIED WITH: MICHELLE WILLIAMS AT 0241 ON 12/04/15 BY VAB   CULTURE, BLOOD (ROUTINE X 2) w Reflex to PCR ID Panel     Status: None (Preliminary result)   Collection Time: 12/07/15  9:52 AM  Result Value Ref Range Status   Specimen Description BLOOD LEFT ARM  Final   Special Requests   Final    BOTTLES DRAWN AEROBIC AND ANAEROBIC AER 4ML ANA 2ML   Culture NO GROWTH 1 DAY  Final   Report Status PENDING  Incomplete  CULTURE, BLOOD (ROUTINE X 2) w Reflex to PCR ID Panel     Status: None (Preliminary result)   Collection Time: 12/07/15  9:58 AM  Result Value Ref Range Status   Specimen Description BLOOD RIGHT HAND  Final   Special Requests   Final    BOTTLES DRAWN AEROBIC AND ANAEROBIC AER 5ML ANA 2ML   Culture NO GROWTH 1 DAY  Final   Report  Status PENDING  Incomplete     Studies: No results found.  Scheduled Meds: . sodium chloride   Intravenous Once  . antiseptic oral rinse  7 mL Mouth Rinse BID  . cefTRIAXone (ROCEPHIN)  IV  2 g Intravenous Q24H  . Chlorhexidine Gluconate Cloth  6 each Topical Q0600   . insulin aspart  0-9 Units Subcutaneous TID WC  . metoprolol tartrate  25 mg Oral BID  . QUEtiapine  25 mg Oral QHS  . sodium chloride flush  3 mL Intravenous Q12H    Assessment/Plan:  1. Severe thrombocytopenia.  2. 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 3. Sepsis due to Proteus source urinary tract infection continue ceftriaxone WBC continues to be elevated. I'll ask infectious disease opinion and his blood cultures were negative 4. Essential hypertension blood pressure control continue metoprolol  5. Type 2 diabetes elevated due to Decadron. Continue to monitor blood sugar now showed drop with stopping of the steroids  6. . History of atrial fibrillation continue metoprolol  7. Anemia possibly anemia of chronic disease continue to monitor 8. Bleeding from her vaginal canal at this point will monitor her for time being in light of her severe thrombocytopenia recently. Could be old blood. If persists will need GYN evaluation  Code Status:     Code Status Orders        Start     Ordered   12/04/15 0101  Full code   Continuous     12/04/15 0100    Code Status History    Date Active Date Inactive Code Status Order ID Comments User Context   10/31/2015 10:13 PM 11/09/2015  6:03 PM Full Code [id number]  Colleen Can, MD Inpatient   10/30/2015  3:10 PM 10/31/2015 10:13 PM DNR [id number]  Colleen Can, MD Inpatient   10/30/2015  2:18 PM 10/30/2015  3:10 PM DNR [id number]  Colleen Can, MD Inpatient   10/20/2015  5:14 PM 10/30/2015  2:18 PM Full Code [id number]  Fritzi Mandes, MD Inpatient     Disposition Plan: To be determined  Consultants:  Oncology  Antibiotics:  Vancomycin  Unasyn  Time spent: 25 minutes   [place], [place] [facility]

## 2015-12-08 NOTE — Telephone Encounter (Signed)
Patient unable to follow up today due to current hospitalization. Please reschedule for into weeks and let her know.  Hollice Espy, MD

## 2015-12-09 ENCOUNTER — Inpatient Hospital Stay: Payer: Medicare HMO

## 2015-12-09 LAB — CBC WITH DIFFERENTIAL/PLATELET
Band Neutrophils: 4 %
Basophils Absolute: 0 10*3/uL (ref 0–0.1)
Basophils Relative: 0 %
Blasts: 0 %
EOS PCT: 1 %
Eosinophils Absolute: 0.2 10*3/uL (ref 0–0.7)
HEMATOCRIT: 29.7 % — AB (ref 35.0–47.0)
HEMOGLOBIN: 10.2 g/dL — AB (ref 12.0–16.0)
LYMPHS PCT: 7 %
Lymphs Abs: 1.5 10*3/uL (ref 1.0–3.6)
MCH: 30.3 pg (ref 26.0–34.0)
MCHC: 34.2 g/dL (ref 32.0–36.0)
MCV: 88.7 fL (ref 80.0–100.0)
MYELOCYTES: 2 %
Metamyelocytes Relative: 3 %
Monocytes Absolute: 1.5 10*3/uL — ABNORMAL HIGH (ref 0.2–0.9)
Monocytes Relative: 7 %
NEUTROS PCT: 76 %
NRBC: 0 /100{WBCs}
Neutro Abs: 18.5 10*3/uL — ABNORMAL HIGH (ref 1.4–6.5)
Other: 0 %
PROMYELOCYTES ABS: 0 %
Platelets: 86 10*3/uL — ABNORMAL LOW (ref 150–440)
RBC: 3.35 MIL/uL — AB (ref 3.80–5.20)
RDW: 15.6 % — ABNORMAL HIGH (ref 11.5–14.5)
WBC: 21.7 10*3/uL — AB (ref 3.6–11.0)

## 2015-12-09 LAB — GLUCOSE, CAPILLARY
GLUCOSE-CAPILLARY: 155 mg/dL — AB (ref 65–99)
GLUCOSE-CAPILLARY: 128 mg/dL — AB (ref 65–99)
Glucose-Capillary: 135 mg/dL — ABNORMAL HIGH (ref 65–99)
Glucose-Capillary: 140 mg/dL — ABNORMAL HIGH (ref 65–99)

## 2015-12-09 MED ORDER — DOCUSATE SODIUM 100 MG PO CAPS
100.0000 mg | ORAL_CAPSULE | Freq: Two times a day (BID) | ORAL | Status: DC
  Administered 2015-12-09 – 2015-12-10 (×3): 100 mg via ORAL
  Filled 2015-12-09 (×3): qty 1

## 2015-12-09 MED ORDER — SENNA 8.6 MG PO TABS
2.0000 | ORAL_TABLET | Freq: Every day | ORAL | Status: DC
  Administered 2015-12-09 – 2015-12-10 (×2): 17.2 mg via ORAL
  Filled 2015-12-09 (×2): qty 2

## 2015-12-09 MED ORDER — POLYETHYLENE GLYCOL 3350 17 G PO PACK
17.0000 g | PACK | Freq: Every day | ORAL | Status: DC
  Administered 2015-12-09 – 2015-12-10 (×2): 17 g via ORAL
  Filled 2015-12-09 (×2): qty 1

## 2015-12-09 MED ORDER — LACTULOSE 10 GM/15ML PO SOLN
30.0000 g | Freq: Two times a day (BID) | ORAL | Status: DC
  Administered 2015-12-09 (×2): 30 g via ORAL
  Filled 2015-12-09 (×4): qty 60

## 2015-12-09 NOTE — Progress Notes (Addendum)
Patient ID: April [name], female   DOB: 1946/07/25, 70 y.o.   MRN: [id number] [facility] PROGRESS NOTE  April [name] [id number]:[id number] DOB: 08-08-1946 DOA: 12/03/2015 PCP: Albina Billet, MD  HPI/Subjective:  Patient's WBC count is little lower. Complains of back pain and constipation Objective: Filed Vitals:   12/09/15 0456 12/09/15 0921  BP: 127/71 116/64  Pulse: 80 91  Temp: 97.5 F (36.4 C)   Resp: 19     Filed Weights   12/03/15 1733 12/04/15 0100  Weight: 113.853 kg (251 lb) 102.7 kg (226 lb 6.6 oz)    ROS: Review of Systems  Constitutional: Negative for fever and chills.  Eyes: Negative for blurred vision.  Respiratory: Positive for cough. Negative for shortness of breath.   Cardiovascular: Negative for chest pain.  Gastrointestinal: Constipationt. Negative for nausea, vomiting and diarrhea.  Genitourinary: Negative for dysuria.  Musculoskeletal: Negative for joint pain.  Neurological: Positive for weakness. Negative for dizziness and headaches.  Endo/Heme/Allergies: Easy bruising and bleeding  Exam: Physical Exam  Constitutional: She is oriented to person, place, and time.  HENT:  Nose: No mucosal edema.  Mouth/Throat: No oropharyngeal exudate or posterior oropharyngeal edema.  Eyes: Conjunctivae, EOM and lids are normal. Pupils are equal, round, and reactive to light.  Neck: No JVD present. Carotid bruit is not present. No edema present. No thyroid mass and no thyromegaly present.  Cardiovascular: S1 normal and S2 normal.  Exam reveals no gallop.   Murmur present  Systolic murmur is present with a grade of 2/6  Pulses:      Dorsalis pedis pulses are 2+ on the right side, and 2+ on the left side.  Respiratory: Diminished breath sounds in bilateral lower lung fields.  GI: Soft. Bowel sounds are normal. There is no tenderness.  Musculoskeletal:       Right ankle: She exhibits swelling.       Left ankle: She exhibits swelling.  Lymphadenopathy:     She has no cervical adenopathy.  Neurological: She is alert and oriented to person, place, and time. No cranial nerve deficit.  Skin: Skin is warm. Nails show no clubbing.  Gangrene on all of her toes both feet  Psychiatric: She has a normal mood and affect.      Data Reviewed: Basic Metabolic Panel:  Recent Labs Lab 12/03/15 1736 12/04/15 0351 12/05/15 0520 12/06/15 0500 12/08/15 0400  NA 133* 132* 131* 132* 131*  K 3.8 4.3 3.9 3.8 3.9  CL 95* 96* 98* 99* 99*  CO2 33* '27 25 25 26  '$ GLUCOSE 102* 171* 295* 279* 160*  BUN 26* 23* 21* 24* 29*  CREATININE 0.88 0.86 0.75 0.72 0.62  CALCIUM 8.6* 8.5* 8.5* 8.4* 8.5*   CBC:  Recent Labs Lab 12/03/15 1736  12/05/15 0520 12/05/15 1748 12/06/15 0500 12/07/15 0750 12/08/15 0400 12/09/15 0431  WBC 16.7*  < > 22.1* 29.0* 22.8* 25.6* 25.9* 21.7*  NEUTROABS 13.6*  --  19.3*  --   --  22.5* 21.8* 18.5*  HGB 10.5*  < > 8.2* 8.6* 7.7* 9.1* 9.6* 10.2*  HCT 31.6*  < > 24.6* 25.8* 23.0* 26.8* 27.9* 29.7*  MCV 90.4  < > 90.0 91.1 89.7 89.6 88.4 88.7  PLT 5*  < > <5* 10* 44* 66* 82* 86*  < > = values in this interval not displayed.  CBG:  Recent Labs Lab 12/08/15 0738 12/08/15 1117 12/08/15 1616 12/08/15 2136 12/09/15 0723  GLUCAP 127* 128* 110* 145* 135*  Recent Results (from the past 240 hour(s))  Culture, blood (Routine X 2) w Reflex to ID Panel     Status: None   Collection Time: 12/03/15 12:00 AM  Result Value Ref Range Status   Specimen Description BLOOD LEFT ARM  Final   Special Requests BOTTLES DRAWN AEROBIC AND ANAEROBIC 5ML  Final   Culture  Setup Time   Final    GRAM NEGATIVE RODS IN BOTH AEROBIC AND ANAEROBIC BOTTLES CRITICAL RESULT CALLED TO, READ BACK BY AND VERIFIED WITH: JASON ROBBINS AT 2035 12/04/15 MLZ     Culture   Final    PROTEUS MIRABILIS IN BOTH AEROBIC AND ANAEROBIC BOTTLES CONFIRMED BY RW    Report Status 12/06/2015 FINAL  Final   Organism ID, Bacteria PROTEUS MIRABILIS  Final       Susceptibility   Proteus mirabilis - MIC*    AMPICILLIN <=2 SENSITIVE Sensitive     CEFAZOLIN <=4 SENSITIVE Sensitive     CEFEPIME <=1 SENSITIVE Sensitive     CEFTAZIDIME <=1 SENSITIVE Sensitive     CEFTRIAXONE <=1 SENSITIVE Sensitive     CIPROFLOXACIN <=0.25 SENSITIVE Sensitive     GENTAMICIN <=1 SENSITIVE Sensitive     IMIPENEM 2 SENSITIVE Sensitive     TRIMETH/SULFA <=20 SENSITIVE Sensitive     AMPICILLIN/SULBACTAM <=2 SENSITIVE Sensitive     PIP/TAZO <=4 SENSITIVE Sensitive     * PROTEUS MIRABILIS  Blood Culture ID Panel (Reflexed)     Status: Abnormal   Collection Time: 12/03/15 12:00 AM  Result Value Ref Range Status   Enterococcus species NOT DETECTED NOT DETECTED Final   Vancomycin resistance NOT DETECTED NOT DETECTED Final   Listeria monocytogenes NOT DETECTED NOT DETECTED Final   Staphylococcus species NOT DETECTED NOT DETECTED Final   Staphylococcus aureus NOT DETECTED NOT DETECTED Final   Methicillin resistance NOT DETECTED NOT DETECTED Final   Streptococcus species NOT DETECTED NOT DETECTED Final   Streptococcus agalactiae NOT DETECTED NOT DETECTED Final   Streptococcus pneumoniae NOT DETECTED NOT DETECTED Final   Streptococcus pyogenes NOT DETECTED NOT DETECTED Final   Acinetobacter baumannii NOT DETECTED NOT DETECTED Final   Enterobacteriaceae species DETECTED (A) NOT DETECTED Final    Comment: CALLED TO JASON ROBBINS AT 2035 12/04/15 MLZ    Enterobacter cloacae complex NOT DETECTED NOT DETECTED Final   Escherichia coli NOT DETECTED NOT DETECTED Final   Klebsiella oxytoca NOT DETECTED NOT DETECTED Final   Klebsiella pneumoniae NOT DETECTED NOT DETECTED Final   Proteus species DETECTED (A) NOT DETECTED Final    Comment: CALLED TO JASON ROBBINS AT 2035 12/04/15 MLZ    Serratia marcescens NOT DETECTED NOT DETECTED Final   Carbapenem resistance NOT DETECTED NOT DETECTED Final   Haemophilus influenzae NOT DETECTED NOT DETECTED Final   Neisseria meningitidis NOT DETECTED  NOT DETECTED Final   Pseudomonas aeruginosa NOT DETECTED NOT DETECTED Final   Candida albicans NOT DETECTED NOT DETECTED Final   Candida glabrata NOT DETECTED NOT DETECTED Final   Candida krusei NOT DETECTED NOT DETECTED Final   Candida parapsilosis NOT DETECTED NOT DETECTED Final   Candida tropicalis NOT DETECTED NOT DETECTED Final  Urine culture     Status: None   Collection Time: 12/03/15  8:53 PM  Result Value Ref Range Status   Specimen Description URINE, RANDOM  Final   Special Requests NONE  Final   Culture >=100,000 COLONIES/mL PROTEUS MIRABILIS  Final   Report Status 12/06/2015 FINAL  Final   Organism ID, Bacteria PROTEUS MIRABILIS  Final      Susceptibility   Proteus mirabilis - MIC*    AMPICILLIN <=2 SENSITIVE Sensitive     CEFAZOLIN <=4 SENSITIVE Sensitive     CEFTRIAXONE <=1 SENSITIVE Sensitive     CIPROFLOXACIN <=0.25 SENSITIVE Sensitive     GENTAMICIN <=1 SENSITIVE Sensitive     IMIPENEM 2 SENSITIVE Sensitive     NITROFURANTOIN 128 RESISTANT Resistant     TRIMETH/SULFA <=20 SENSITIVE Sensitive     AMPICILLIN/SULBACTAM <=2 SENSITIVE Sensitive     PIP/TAZO <=4 SENSITIVE Sensitive     * >=100,000 COLONIES/mL PROTEUS MIRABILIS  Culture, blood (Routine X 2) w Reflex to ID Panel     Status: None   Collection Time: 12/03/15 11:45 PM  Result Value Ref Range Status   Specimen Description BLOOD LEFT ARM  Final   Special Requests   Final    BOTTLES DRAWN AEROBIC AND ANAEROBIC 10MLAEROBIC, 5MLANAEROBIC   Culture  Setup Time   Final    GRAM NEGATIVE RODS IN BOTH AEROBIC AND ANAEROBIC BOTTLES CRITICAL RESULT CALLED TO, READ BACK BY AND VERIFIED WITH: JASON ROBBINS AT 2035 12/04/15 MLZ     Culture   Final    PROTEUS MIRABILIS IN BOTH AEROBIC AND ANAEROBIC BOTTLES CONFIRMED BY RW     Report Status 12/06/2015 FINAL  Final   Organism ID, Bacteria PROTEUS MIRABILIS  Final      Susceptibility   Proteus mirabilis - MIC*    AMPICILLIN <=2 SENSITIVE Sensitive     CEFAZOLIN <=4  SENSITIVE Sensitive     CEFEPIME <=1 SENSITIVE Sensitive     CEFTAZIDIME <=1 SENSITIVE Sensitive     CEFTRIAXONE <=1 SENSITIVE Sensitive     CIPROFLOXACIN <=0.25 SENSITIVE Sensitive     GENTAMICIN <=1 SENSITIVE Sensitive     IMIPENEM 2 SENSITIVE Sensitive     TRIMETH/SULFA <=20 SENSITIVE Sensitive     AMPICILLIN/SULBACTAM <=2 SENSITIVE Sensitive     PIP/TAZO <=4 SENSITIVE Sensitive     * PROTEUS MIRABILIS  MRSA PCR Screening     Status: Abnormal   Collection Time: 12/04/15  1:11 AM  Result Value Ref Range Status   MRSA by PCR POSITIVE (A) NEGATIVE Final    Comment:        The GeneXpert MRSA Assay (FDA approved for NASAL specimens only), is one component of a comprehensive MRSA colonization surveillance program. It is not intended to diagnose MRSA infection nor to guide or monitor treatment for MRSA infections. CRITICAL RESULT CALLED TO, READ BACK BY AND VERIFIED WITH: MICHELLE WILLIAMS AT 0241 ON 12/04/15 BY VAB   CULTURE, BLOOD (ROUTINE X 2) w Reflex to PCR ID Panel     Status: None (Preliminary result)   Collection Time: 12/07/15  9:52 AM  Result Value Ref Range Status   Specimen Description BLOOD LEFT ARM  Final   Special Requests   Final    BOTTLES DRAWN AEROBIC AND ANAEROBIC AER 4ML ANA 2ML   Culture NO GROWTH 2 DAYS  Final   Report Status PENDING  Incomplete  CULTURE, BLOOD (ROUTINE X 2) w Reflex to PCR ID Panel     Status: None (Preliminary result)   Collection Time: 12/07/15  9:58 AM  Result Value Ref Range Status   Specimen Description BLOOD RIGHT HAND  Final   Special Requests   Final    BOTTLES DRAWN AEROBIC AND ANAEROBIC AER 5ML ANA 2ML   Culture NO GROWTH 2 DAYS  Final   Report Status PENDING  Incomplete  Studies: No results found.  Scheduled Meds: . sodium chloride   Intravenous Once  . antiseptic oral rinse  7 mL Mouth Rinse BID  . cefTRIAXone (ROCEPHIN)  IV  2 g Intravenous Q24H  . docusate sodium  100 mg Oral BID  . insulin aspart  0-9 Units  Subcutaneous TID WC  . lactulose  30 g Oral BID  . metoprolol tartrate  25 mg Oral BID  . polyethylene glycol  17 g Oral Daily  . QUEtiapine  25 mg Oral QHS  . senna  2 tablet Oral Daily  . sodium chloride flush  3 mL Intravenous Q12H    Assessment/Plan:  1. Severe thrombocytopenia. 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 Platelet count stable 2. Sepsis due to Proteus source urinary tract infection continue ceftriaxone WBC count continues to be elevated. Id consult pending recent blood cultures negative and WBC is trending down tomorrow 3 Essential hypertension continue metoprolo  4. Constipation I will start patient on MiraLAX and lactulose and Senokot if that doesn't work we'll give her mag citrate 5. Type 2 diabetes elevated due to Decadron.  blood sugar trending down  6. . History of atrial fibrillation continue metoprolol  7. Anemia possibly anemia of chronic disease continue to monitor 8. Bleeding from her vaginal canal no further bleeding  Disposal if patient's WBC count is trending down tomorrow will discharge   Code Status:     Code Status Orders        Start     Ordered   12/04/15 0101  Full code   Continuous     12/04/15 0100    Code Status History    Date Active Date Inactive Code Status Order ID Comments User Context   10/31/2015 10:13 PM 11/09/2015  6:03 PM Full Code [id number]  Colleen Can, MD Inpatient   10/30/2015  3:10 PM 10/31/2015 10:13 PM DNR [id number]  Colleen Can, MD Inpatient   10/30/2015  2:18 PM 10/30/2015  3:10 PM DNR [id number]  Colleen Can, MD Inpatient   10/20/2015  5:14 PM 10/30/2015  2:18 PM Full Code [id number]  Fritzi Mandes, MD Inpatient     Disposition Plan: To be determined  Consultants:  Oncology  Antibiotics:  Vancomycin  Unasyn  Time spent: 20 minutes   [place], [place] [facility]

## 2015-12-09 NOTE — Consult Note (Signed)
[facility] Infectious Disease     Reason for Consult: leukocytosis    Referring Physician: Serita Grit Date of Admission:  12/03/2015   Principal Problem:   Thrombocytopenia (HCC) Active Problems:   Leukocytosis   Type 2 diabetes mellitus (HCC)   Paroxysmal atrial fibrillation (HCC)   Back pain   HPI: April [name] is a 70 y.o. female with persistent leukocytosis. She had admission in Feb after she was  found down and admitted with ARF, urosepsis with proteus (blood and urine), and acute resp failure with sputum cxs growing enterobacter. 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 She was readmitted with severe TCP with nosebleeds. WBC remains elevated.  [place] grew Proteus in 2 sets and ucx as well.  She has had some dysuria and continues to have L back pain - she relates it to PT recently Some blood in the urine  Past Medical History  Diagnosis Date  . Renal disorder   . NSTEMI (non-ST elevated myocardial infarction) ([place])   . Acute renal failure (ARF) ([place])   . Type 2 diabetes mellitus ([place])   . Paroxysmal atrial fibrillation [facility])    Past Surgical History  Procedure Laterality Date  . Cystoscopy with stent placement Right 11/03/2015    Procedure: CYSTOSCOPY WITH STENT PLACEMENT;  Surgeon: Hollice Espy, MD;  Location: [facility] ORS;  Service: Urology;  Laterality: Right;  . Tonsillectomy     Social History  Substance Use Topics  . Smoking status: Never Smoker   . Smokeless tobacco: None  . Alcohol Use: No   Family History  Problem Relation Age of Onset  . Diabetes Father   . Stroke Maternal Grandmother   . Heart disease Father     Allergies: No Active Allergies  Current antibiotics: Antibiotics Given (last 72 hours)    Date/Time Action Medication Dose Rate   12/06/15 2230 Given   cefTRIAXone (ROCEPHIN) 2 g in dextrose 5 % 50 mL IVPB 2 g 100 mL/hr   12/07/15 2302 Given   cefTRIAXone (ROCEPHIN) 2 g in dextrose  5 % 50 mL IVPB 2 g 100 mL/hr   12/08/15 2250 Given   cefTRIAXone (ROCEPHIN) 2 g in dextrose 5 % 50 mL IVPB 2 g 100 mL/hr      MEDICATIONS: . sodium chloride   Intravenous Once  . antiseptic oral rinse  7 mL Mouth Rinse BID  . cefTRIAXone (ROCEPHIN)  IV  2 g Intravenous Q24H  . docusate sodium  100 mg Oral BID  . insulin aspart  0-9 Units Subcutaneous TID WC  . lactulose  30 g Oral BID  . metoprolol tartrate  25 mg Oral BID  . polyethylene glycol  17 g Oral Daily  . QUEtiapine  25 mg Oral QHS  . senna  2 tablet Oral Daily  . sodium chloride flush  3 mL Intravenous Q12H    Review of Systems - 11 systems reviewed and negative per HPI   OBJECTIVE: Temp:  [97.5 F (36.4 C)-97.7 F (36.5 C)] 97.5 F (36.4 C) (03/15 0456) Pulse Rate:  [74-91] 91 (03/15 0921) Resp:  [19] 19 (03/15 0456) BP: (116-127)/(64-71) 116/64 mmHg (03/15 0921) SpO2:  [96 %-97 %] 96 % (03/15 0456) Physical Exam  Constitutional:  oriented to person, place, and time. appears well-developed and well-nourished. No distress. obese HENT: [place]/AT, PERRLA, no scleral icterus Mouth/Throat: Oropharynx is clear and moist. No oropharyngeal exudate.  Cardiovascular: Normal rate, regular rhythm  and normal heart sounds. Pulmonary/Chest: Effort normal and breath sounds normal. No respiratory distress.  has no wheezes.  Neck = supple, no nuchal rigidity Abdominal: Soft. Bowel sounds are normal.  exhibits no distension. There is no tenderness.  Lymphadenopathy: no cervical adenopathy. No axillary adenopathy Neurological: alert and oriented to person, place, and time.  Skin: bil toes with dry gangrene Psychiatric: a normal mood and affect.  behavior is normal.   LABS: Results for orders placed or performed during the hospital encounter of 12/03/15 (from the past 48 hour(s))  Glucose, capillary     Status: Abnormal   Collection Time: 12/07/15  5:00 PM  Result Value Ref Range   Glucose-Capillary 248 (H) 65 - 99 mg/dL   Glucose, capillary     Status: Abnormal   Collection Time: 12/07/15  9:18 PM  Result Value Ref Range   Glucose-Capillary 191 (H) 65 - 99 mg/dL   Comment 1 Notify RN   CBC with Differential     Status: Abnormal   Collection Time: 12/08/15  4:00 AM  Result Value Ref Range   WBC 25.9 (H) 3.6 - 11.0 K/uL   RBC 3.15 (L) 3.80 - 5.20 MIL/uL   Hemoglobin 9.6 (L) 12.0 - 16.0 g/dL   HCT 27.9 (L) 35.0 - 47.0 %   MCV 88.4 80.0 - 100.0 fL   MCH 30.4 26.0 - 34.0 pg   MCHC 34.4 32.0 - 36.0 g/dL   RDW 15.2 (H) 11.5 - 14.5 %   Platelets 82 (L) 150 - 440 K/uL   Neutrophils Relative % 80 %   Lymphocytes Relative 7 %   Monocytes Relative 9 %   Eosinophils Relative 0 %   Basophils Relative 0 %   Band Neutrophils 0 %   Metamyelocytes Relative 2 %   Myelocytes 2 %   Promyelocytes Absolute 0 %   Blasts 0 %   nRBC 1 (H) 0 /100 WBC   Other 0 %   Neutro Abs 21.8 (H) 1.4 - 6.5 K/uL   Lymphs Abs 1.8 1.0 - 3.6 K/uL   Monocytes Absolute 2.3 (H) 0.2 - 0.9 K/uL   Eosinophils Absolute 0.0 0 - 0.7 K/uL   Basophils Absolute 0.0 0 - 0.1 K/uL   RBC Morphology MIXED RBC POPULATION    WBC Morphology HYPERSEGMENTED NEUT    Smear Review LARGE PLATELETS PRESENT     Comment: PATH REVIEW HAS BEEN ORDERED ON THIS ACCESSION NUMBER  Basic metabolic panel     Status: Abnormal   Collection Time: 12/08/15  4:00 AM  Result Value Ref Range   Sodium 131 (L) 135 - 145 mmol/L   Potassium 3.9 3.5 - 5.1 mmol/L   Chloride 99 (L) 101 - 111 mmol/L   CO2 26 22 - 32 mmol/L   Glucose, Bld 160 (H) 65 - 99 mg/dL   BUN 29 (H) 6 - 20 mg/dL   Creatinine, Ser 0.62 0.44 - 1.00 mg/dL   Calcium 8.5 (L) 8.9 - 10.3 mg/dL   GFR calc non Af Amer >60 >60 mL/min   GFR calc Af Amer >60 >60 mL/min    Comment: (NOTE) The eGFR has been calculated using the CKD EPI equation. This calculation has not been validated in all clinical situations. eGFR's persistently <60 mL/min signify possible Chronic Kidney Disease.    Anion gap 6 5 - 15   Pathologist smear review     Status: None   Collection Time: 12/08/15  4:00 AM  Result Value Ref Range  Path Review Peripheral blood smear is adequate for evaluation.     Comment: Mild normocytic anemia with anisocytosis and rare circulating forms. Leukocytosis with absolute neutrophila and slight left shift, absolute monocytosis. Decreased platelets with variation in size, normal granularity. Findings are most consistent with reactive changes. Patient has bacteremia and recent steroid therapy. Reviewed by Dellia Nims Rubinas, M.D.   Glucose, capillary     Status: Abnormal   Collection Time: 12/08/15  7:38 AM  Result Value Ref Range   Glucose-Capillary 127 (H) 65 - 99 mg/dL   Comment 1 Notify RN   Glucose, capillary     Status: Abnormal   Collection Time: 12/08/15 11:17 AM  Result Value Ref Range   Glucose-Capillary 128 (H) 65 - 99 mg/dL   Comment 1 Notify RN   Glucose, capillary     Status: Abnormal   Collection Time: 12/08/15  4:16 PM  Result Value Ref Range   Glucose-Capillary 110 (H) 65 - 99 mg/dL   Comment 1 Notify RN   Glucose, capillary     Status: Abnormal   Collection Time: 12/08/15  9:36 PM  Result Value Ref Range   Glucose-Capillary 145 (H) 65 - 99 mg/dL   Comment 1 Notify RN   CBC with Differential     Status: Abnormal   Collection Time: 12/09/15  4:31 AM  Result Value Ref Range   WBC 21.7 (H) 3.6 - 11.0 K/uL   RBC 3.35 (L) 3.80 - 5.20 MIL/uL   Hemoglobin 10.2 (L) 12.0 - 16.0 g/dL   HCT 29.7 (L) 35.0 - 47.0 %   MCV 88.7 80.0 - 100.0 fL   MCH 30.3 26.0 - 34.0 pg   MCHC 34.2 32.0 - 36.0 g/dL   RDW 15.6 (H) 11.5 - 14.5 %   Platelets 86 (L) 150 - 440 K/uL   Neutrophils Relative % 76 %   Lymphocytes Relative 7 %   Monocytes Relative 7 %   Eosinophils Relative 1 %   Basophils Relative 0 %   Band Neutrophils 4 %   Metamyelocytes Relative 3 %   Myelocytes 2 %   Promyelocytes Absolute 0 %   Blasts 0 %   nRBC 0 0 /100 WBC   Other 0 %   Neutro Abs 18.5 (H) 1.4 -  6.5 K/uL   Lymphs Abs 1.5 1.0 - 3.6 K/uL   Monocytes Absolute 1.5 (H) 0.2 - 0.9 K/uL   Eosinophils Absolute 0.2 0 - 0.7 K/uL   Basophils Absolute 0.0 0 - 0.1 K/uL   RBC Morphology MIXED RBC POPULATION    WBC Morphology HYPERSEGMENTED NEUT    Smear Review LARGE PLATELETS PRESENT   Glucose, capillary     Status: Abnormal   Collection Time: 12/09/15  7:23 AM  Result Value Ref Range   Glucose-Capillary 135 (H) 65 - 99 mg/dL   Comment 1 Notify RN   Glucose, capillary     Status: Abnormal   Collection Time: 12/09/15 11:25 AM  Result Value Ref Range   Glucose-Capillary 155 (H) 65 - 99 mg/dL   Comment 1 Notify RN    No components found for: ESR, C REACTIVE PROTEIN MICRO: Recent Results (from the past 720 hour(s))  Culture, blood (Routine X 2) w Reflex to ID Panel     Status: None   Collection Time: 12/03/15 12:00 AM  Result Value Ref Range Status   Specimen Description BLOOD LEFT ARM  Final   Special Requests BOTTLES DRAWN AEROBIC AND ANAEROBIC 5ML  Final  Culture  Setup Time   Final    GRAM NEGATIVE RODS IN BOTH AEROBIC AND ANAEROBIC BOTTLES CRITICAL RESULT CALLED TO, READ BACK BY AND VERIFIED WITH: JASON ROBBINS AT 2035 12/04/15 MLZ     Culture   Final    PROTEUS MIRABILIS IN BOTH AEROBIC AND ANAEROBIC BOTTLES CONFIRMED BY RW    Report Status 12/06/2015 FINAL  Final   Organism ID, Bacteria PROTEUS MIRABILIS  Final      Susceptibility   Proteus mirabilis - MIC*    AMPICILLIN <=2 SENSITIVE Sensitive     CEFAZOLIN <=4 SENSITIVE Sensitive     CEFEPIME <=1 SENSITIVE Sensitive     CEFTAZIDIME <=1 SENSITIVE Sensitive     CEFTRIAXONE <=1 SENSITIVE Sensitive     CIPROFLOXACIN <=0.25 SENSITIVE Sensitive     GENTAMICIN <=1 SENSITIVE Sensitive     IMIPENEM 2 SENSITIVE Sensitive     TRIMETH/SULFA <=20 SENSITIVE Sensitive     AMPICILLIN/SULBACTAM <=2 SENSITIVE Sensitive     PIP/TAZO <=4 SENSITIVE Sensitive     * PROTEUS MIRABILIS  Blood Culture ID Panel (Reflexed)     Status:  Abnormal   Collection Time: 12/03/15 12:00 AM  Result Value Ref Range Status   Enterococcus species NOT DETECTED NOT DETECTED Final   Vancomycin resistance NOT DETECTED NOT DETECTED Final   Listeria monocytogenes NOT DETECTED NOT DETECTED Final   Staphylococcus species NOT DETECTED NOT DETECTED Final   Staphylococcus aureus NOT DETECTED NOT DETECTED Final   Methicillin resistance NOT DETECTED NOT DETECTED Final   Streptococcus species NOT DETECTED NOT DETECTED Final   Streptococcus agalactiae NOT DETECTED NOT DETECTED Final   Streptococcus pneumoniae NOT DETECTED NOT DETECTED Final   Streptococcus pyogenes NOT DETECTED NOT DETECTED Final   Acinetobacter baumannii NOT DETECTED NOT DETECTED Final   Enterobacteriaceae species DETECTED (A) NOT DETECTED Final    Comment: CALLED TO JASON ROBBINS AT 2035 12/04/15 MLZ    Enterobacter cloacae complex NOT DETECTED NOT DETECTED Final   Escherichia coli NOT DETECTED NOT DETECTED Final   Klebsiella oxytoca NOT DETECTED NOT DETECTED Final   Klebsiella pneumoniae NOT DETECTED NOT DETECTED Final   Proteus species DETECTED (A) NOT DETECTED Final    Comment: CALLED TO JASON ROBBINS AT 2035 12/04/15 MLZ    Serratia marcescens NOT DETECTED NOT DETECTED Final   Carbapenem resistance NOT DETECTED NOT DETECTED Final   Haemophilus influenzae NOT DETECTED NOT DETECTED Final   Neisseria meningitidis NOT DETECTED NOT DETECTED Final   Pseudomonas aeruginosa NOT DETECTED NOT DETECTED Final   Candida albicans NOT DETECTED NOT DETECTED Final   Candida glabrata NOT DETECTED NOT DETECTED Final   Candida krusei NOT DETECTED NOT DETECTED Final   Candida parapsilosis NOT DETECTED NOT DETECTED Final   Candida tropicalis NOT DETECTED NOT DETECTED Final  Urine culture     Status: None   Collection Time: 12/03/15  8:53 PM  Result Value Ref Range Status   Specimen Description URINE, RANDOM  Final   Special Requests NONE  Final   Culture >=100,000 COLONIES/mL PROTEUS  MIRABILIS  Final   Report Status 12/06/2015 FINAL  Final   Organism ID, Bacteria PROTEUS MIRABILIS  Final      Susceptibility   Proteus mirabilis - MIC*    AMPICILLIN <=2 SENSITIVE Sensitive     CEFAZOLIN <=4 SENSITIVE Sensitive     CEFTRIAXONE <=1 SENSITIVE Sensitive     CIPROFLOXACIN <=0.25 SENSITIVE Sensitive     GENTAMICIN <=1 SENSITIVE Sensitive     IMIPENEM 2 SENSITIVE  Sensitive     NITROFURANTOIN 128 RESISTANT Resistant     TRIMETH/SULFA <=20 SENSITIVE Sensitive     AMPICILLIN/SULBACTAM <=2 SENSITIVE Sensitive     PIP/TAZO <=4 SENSITIVE Sensitive     * >=100,000 COLONIES/mL PROTEUS MIRABILIS  Culture, blood (Routine X 2) w Reflex to ID Panel     Status: None   Collection Time: 12/03/15 11:45 PM  Result Value Ref Range Status   Specimen Description BLOOD LEFT ARM  Final   Special Requests   Final    BOTTLES DRAWN AEROBIC AND ANAEROBIC 10MLAEROBIC, 5MLANAEROBIC   Culture  Setup Time   Final    GRAM NEGATIVE RODS IN BOTH AEROBIC AND ANAEROBIC BOTTLES CRITICAL RESULT CALLED TO, READ BACK BY AND VERIFIED WITH: JASON ROBBINS AT 2035 12/04/15 MLZ     Culture   Final    PROTEUS MIRABILIS IN BOTH AEROBIC AND ANAEROBIC BOTTLES CONFIRMED BY RW     Report Status 12/06/2015 FINAL  Final   Organism ID, Bacteria PROTEUS MIRABILIS  Final      Susceptibility   Proteus mirabilis - MIC*    AMPICILLIN <=2 SENSITIVE Sensitive     CEFAZOLIN <=4 SENSITIVE Sensitive     CEFEPIME <=1 SENSITIVE Sensitive     CEFTAZIDIME <=1 SENSITIVE Sensitive     CEFTRIAXONE <=1 SENSITIVE Sensitive     CIPROFLOXACIN <=0.25 SENSITIVE Sensitive     GENTAMICIN <=1 SENSITIVE Sensitive     IMIPENEM 2 SENSITIVE Sensitive     TRIMETH/SULFA <=20 SENSITIVE Sensitive     AMPICILLIN/SULBACTAM <=2 SENSITIVE Sensitive     PIP/TAZO <=4 SENSITIVE Sensitive     * PROTEUS MIRABILIS  MRSA PCR Screening     Status: Abnormal   Collection Time: 12/04/15  1:11 AM  Result Value Ref Range Status   MRSA by PCR POSITIVE (A)  NEGATIVE Final    Comment:        The GeneXpert MRSA Assay (FDA approved for NASAL specimens only), is one component of a comprehensive MRSA colonization surveillance program. It is not intended to diagnose MRSA infection nor to guide or monitor treatment for MRSA infections. CRITICAL RESULT CALLED TO, READ BACK BY AND VERIFIED WITH: MICHELLE WILLIAMS AT 5364 ON 12/04/15 BY VAB   CULTURE, BLOOD (ROUTINE X 2) w Reflex to PCR ID Panel     Status: None (Preliminary result)   Collection Time: 12/07/15  9:52 AM  Result Value Ref Range Status   Specimen Description BLOOD LEFT ARM  Final   Special Requests   Final    BOTTLES DRAWN AEROBIC AND ANAEROBIC AER 4ML ANA 2ML   Culture NO GROWTH 2 DAYS  Final   Report Status PENDING  Incomplete  CULTURE, BLOOD (ROUTINE X 2) w Reflex to PCR ID Panel     Status: None (Preliminary result)   Collection Time: 12/07/15  9:58 AM  Result Value Ref Range Status   Specimen Description BLOOD RIGHT HAND  Final   Special Requests   Final    BOTTLES DRAWN AEROBIC AND ANAEROBIC AER 5ML ANA 2ML   Culture NO GROWTH 2 DAYS  Final   Report Status PENDING  Incomplete    IMAGING: Dg Chest Port 1 View  12/04/2015  CLINICAL DATA:  Leukocytosis.  Low platelets. EXAM: PORTABLE CHEST 1 VIEW COMPARISON:  11/04/2015 FINDINGS: Lung volumes are low. Cardiomediastinal contours are unchanged allowing for differences in technique. Mild bibasilar atelectasis. No confluent airspace disease. No large pleural effusion. No pneumothorax. Old left rib fractures. IMPRESSION: Hypoventilatory chest  with mild bibasilar atelectasis Electronically Signed   By: Jeb Levering M.D.   On: 12/04/2015 01:13    Assessment:   ANTONEA GAUT is a 70 y.o. 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 Improving. Also has bil dry gangrene.  Recommendations Continue ceftriaxone Needs imaging of kidneys to eval for  recurrent hydro. I have consulted urology. Thank you very much for allowing me to participate in the care of this patient. Please call with questions.   Cheral Marker. Ola Spurr, MD

## 2015-12-09 NOTE — Progress Notes (Signed)
Initial Nutrition Assessment     INTERVENTION:  Meals and snacks: Cater to pt preferences   NUTRITION DIAGNOSIS:    (none at this time) related to   as evidenced by  .    GOAL:   Patient will meet greater than or equal to 90% of their needs    MONITOR:    (Energy intake, Digestive system)  REASON FOR ASSESSMENT:   LOS    ASSESSMENT:      Pt admitted with thrombocytopenia, sepsis UTI  Past Medical History  Diagnosis Date  . Renal disorder   . NSTEMI (non-ST elevated myocardial infarction) ([place])   . Acute renal failure (ARF) ([place])   . Type 2 diabetes mellitus ([place])   . Paroxysmal atrial fibrillation (HCC)     Current Nutrition: tolerating 60-80% of meals  Food/Nutrition-Related History: Pt reports normal intake prior to admission   Scheduled Medications:  . sodium chloride   Intravenous Once  . antiseptic oral rinse  7 mL Mouth Rinse BID  . cefTRIAXone (ROCEPHIN)  IV  2 g Intravenous Q24H  . docusate sodium  100 mg Oral BID  . insulin aspart  0-9 Units Subcutaneous TID WC  . lactulose  30 g Oral BID  . metoprolol tartrate  25 mg Oral BID  . polyethylene glycol  17 g Oral Daily  . QUEtiapine  25 mg Oral QHS  . senna  2 tablet Oral Daily  . sodium chloride flush  3 mL Intravenous Q12H         Electrolyte/Renal Profile and Glucose Profile:   Recent Labs Lab 12/05/15 0520 12/06/15 0500 12/08/15 0400  NA 131* 132* 131*  K 3.9 3.8 3.9  CL 98* 99* 99*  CO2 '25 25 26  '$ BUN 21* 24* 29*  CREATININE 0.75 0.72 0.62  CALCIUM 8.5* 8.4* 8.5*  GLUCOSE 295* 279* 160*    Gastrointestinal Profile: Last BM:3/11     Weight Change: stable wt prior to admission    Diet Order:  Diet Carb Modified Fluid consistency:: Thin; Room service appropriate?: Yes  Skin:   reviewed   Height:   Ht Readings from Last 1 Encounters:  12/04/15 '5\' 7"'$  (1.702 m)    Weight:   Wt Readings from Last 1 Encounters:  12/04/15 226 lb 6.6 oz (102.7 kg)    Ideal  Body Weight:     BMI:  Body mass index is 35.45 kg/(m^2).   EDUCATION NEEDS:   No education needs identified at this time  LOW Care Level  Henry Demeritt B. Zenia Resides, [place], [place] (pager) Weekend/On-Call pager [phone])

## 2015-12-10 DIAGNOSIS — N135 Crossing vessel and stricture of ureter without hydronephrosis: Secondary | ICD-10-CM

## 2015-12-10 LAB — CBC WITH DIFFERENTIAL/PLATELET
Basophils Absolute: 0 10*3/uL (ref 0–0.1)
Basophils Relative: 0 %
Eosinophils Absolute: 0.3 10*3/uL (ref 0–0.7)
Eosinophils Relative: 1 %
HEMATOCRIT: 29.4 % — AB (ref 35.0–47.0)
HEMOGLOBIN: 10.1 g/dL — AB (ref 12.0–16.0)
LYMPHS ABS: 1.8 10*3/uL (ref 1.0–3.6)
Lymphocytes Relative: 9 %
MCH: 30.5 pg (ref 26.0–34.0)
MCHC: 34.2 g/dL (ref 32.0–36.0)
MCV: 89.2 fL (ref 80.0–100.0)
MONO ABS: 1.7 10*3/uL — AB (ref 0.2–0.9)
MONOS PCT: 8 %
NEUTROS ABS: 17.8 10*3/uL — AB (ref 1.4–6.5)
NEUTROS PCT: 82 %
Platelets: 123 10*3/uL — ABNORMAL LOW (ref 150–440)
RBC: 3.3 MIL/uL — ABNORMAL LOW (ref 3.80–5.20)
RDW: 16 % — ABNORMAL HIGH (ref 11.5–14.5)
WBC: 21.8 10*3/uL — ABNORMAL HIGH (ref 3.6–11.0)

## 2015-12-10 LAB — GLUCOSE, CAPILLARY
GLUCOSE-CAPILLARY: 131 mg/dL — AB (ref 65–99)
Glucose-Capillary: 138 mg/dL — ABNORMAL HIGH (ref 65–99)

## 2015-12-10 MED ORDER — QUETIAPINE FUMARATE 25 MG PO TABS: 25.0000 mg | ORAL_TABLET | Freq: Every day | ORAL | Status: DC

## 2015-12-10 MED ORDER — TAMSULOSIN HCL 0.4 MG PO CAPS: 0.4000 mg | ORAL_CAPSULE | Freq: Every day | ORAL | Status: DC

## 2015-12-10 MED ORDER — DOCUSATE SODIUM 100 MG PO CAPS: 100.0000 mg | ORAL_CAPSULE | Freq: Two times a day (BID) | ORAL | Status: DC

## 2015-12-10 MED ORDER — TAMSULOSIN HCL 0.4 MG PO CAPS
0.4000 mg | ORAL_CAPSULE | Freq: Every day | ORAL | Status: DC
  Administered 2015-12-10: 0.4 mg via ORAL
  Filled 2015-12-10 (×2): qty 1

## 2015-12-10 MED ORDER — FLEET ENEMA 7-19 GM/118ML RE ENEM
1.0000 | ENEMA | Freq: Once | RECTAL | Status: AC
  Administered 2015-12-10: 1 via RECTAL

## 2015-12-10 MED ORDER — ONDANSETRON HCL 4 MG PO TABS: 4.0000 mg | ORAL_TABLET | Freq: Four times a day (QID) | ORAL | Status: DC | PRN

## 2015-12-10 MED ORDER — CETYLPYRIDINIUM CHLORIDE 0.05 % MT LIQD: 7.0000 mL | Freq: Two times a day (BID) | OROMUCOSAL | Status: DC

## 2015-12-10 MED ORDER — POLYETHYLENE GLYCOL 3350 17 G PO PACK: 17.0000 g | PACK | Freq: Every day | ORAL | Status: DC

## 2015-12-10 MED ORDER — CIPROFLOXACIN HCL 500 MG PO TABS: 500.0000 mg | ORAL_TABLET | Freq: Two times a day (BID) | ORAL | Status: AC

## 2015-12-10 MED ORDER — OXYCODONE HCL 5 MG PO TABS: 5.0000 mg | ORAL_TABLET | Freq: Four times a day (QID) | ORAL | Status: DC | PRN

## 2015-12-10 MED ORDER — CIPROFLOXACIN HCL 500 MG PO TABS: 500.0000 mg | ORAL_TABLET | Freq: Two times a day (BID) | ORAL | Status: DC

## 2015-12-10 NOTE — Telephone Encounter (Signed)
They called today and spoke with April [name], she was told that the patient is at [facility] health care. The patient is not ambulatory and they will not send anyone with her to help her. They are going to send her to [facility] for follow up unless you don't want that? Please advise  Thanks  michelle

## 2015-12-10 NOTE — Progress Notes (Signed)
[place] INFECTIOUS DISEASE PROGRESS NOTE Date of Admission:  12/03/2015     ID: April [name] is a 70 y.o. female with urosepsis  Principal Problem:   Thrombocytopenia (HCC) Active Problems:   Leukocytosis   Type 2 diabetes mellitus (HCC)   Paroxysmal atrial fibrillation (HCC)   Back pain   UPJ (ureteropelvic junction) obstruction   Subjective: No fevers, less pain   ROS  Eleven systems are reviewed and negative except per hpi  Medications:  Antibiotics Given (last 72 hours)    Date/Time Action Medication Dose Rate   12/07/15 2302 Given   cefTRIAXone (ROCEPHIN) 2 g in dextrose 5 % 50 mL IVPB 2 g 100 mL/hr   12/08/15 2250 Given   cefTRIAXone (ROCEPHIN) 2 g in dextrose 5 % 50 mL IVPB 2 g 100 mL/hr   12/09/15 2155 Given   cefTRIAXone (ROCEPHIN) 2 g in dextrose 5 % 50 mL IVPB 2 g 100 mL/hr     . sodium chloride   Intravenous Once  . antiseptic oral rinse  7 mL Mouth Rinse BID  . cefTRIAXone (ROCEPHIN)  IV  2 g Intravenous Q24H  . docusate sodium  100 mg Oral BID  . insulin aspart  0-9 Units Subcutaneous TID WC  . lactulose  30 g Oral BID  . metoprolol tartrate  25 mg Oral BID  . polyethylene glycol  17 g Oral Daily  . QUEtiapine  25 mg Oral QHS  . senna  2 tablet Oral Daily  . sodium chloride flush  3 mL Intravenous Q12H    Objective: Vital signs in last 24 hours: Temp:  [97.8 F (36.6 C)-97.9 F (36.6 C)] 97.8 F (36.6 C) (03/16 0503) Pulse Rate:  [80-82] 80 (03/16 0503) Resp:  [20] 20 (03/16 0503) BP: (122-126)/(58) 122/58 mmHg (03/16 0503) SpO2:  [98 %] 98 % (03/16 0503) Constitutional: oriented to person, place, and time. appears well-developed and well-nourished. No distress. obese HENT: [place]/AT, PERRLA, no scleral icterus Mouth/Throat: Oropharynx is clear and moist. No oropharyngeal exudate.  Cardiovascular: Normal rate, regular rhythm and normal heart sounds. Pulmonary/Chest: Effort normal and breath sounds normal. No respiratory distress. has no  wheezes.  Neck = supple, no nuchal rigidity Abdominal: Soft. Bowel sounds are normal. exhibits no distension. There is no tenderness.  Lymphadenopathy: no cervical adenopathy. No axillary adenopathy Neurological: alert and oriented to person, place, and time.  Skin: bil toes with dry gangrene Psychiatric: a normal mood and affect. behavior is normal  Lab Results  Recent Labs  12/08/15 0400 12/09/15 0431 12/10/15 0449  WBC 25.9* 21.7* 21.8*  HGB 9.6* 10.2* 10.1*  HCT 27.9* 29.7* 29.4*  NA 131*  --   --   K 3.9  --   --   CL 99*  --   --   CO2 26  --   --   BUN 29*  --   --   CREATININE 0.62  --   --     Microbiology: Results for orders placed or performed during the hospital encounter of 12/03/15  Culture, blood (Routine X 2) w Reflex to ID Panel     Status: None   Collection Time: 12/03/15 12:00 AM  Result Value Ref Range Status   Specimen Description BLOOD LEFT ARM  Final   Special Requests BOTTLES DRAWN AEROBIC AND ANAEROBIC 5ML  Final   Culture  Setup Time   Final    GRAM NEGATIVE RODS IN BOTH AEROBIC AND ANAEROBIC BOTTLES CRITICAL RESULT CALLED TO, READ  BACK BY AND VERIFIED WITH: JASON ROBBINS AT 2035 12/04/15 MLZ     Culture   Final    PROTEUS MIRABILIS IN BOTH AEROBIC AND ANAEROBIC BOTTLES CONFIRMED BY RW    Report Status 12/06/2015 FINAL  Final   Organism ID, Bacteria PROTEUS MIRABILIS  Final      Susceptibility   Proteus mirabilis - MIC*    AMPICILLIN <=2 SENSITIVE Sensitive     CEFAZOLIN <=4 SENSITIVE Sensitive     CEFEPIME <=1 SENSITIVE Sensitive     CEFTAZIDIME <=1 SENSITIVE Sensitive     CEFTRIAXONE <=1 SENSITIVE Sensitive     CIPROFLOXACIN <=0.25 SENSITIVE Sensitive     GENTAMICIN <=1 SENSITIVE Sensitive     IMIPENEM 2 SENSITIVE Sensitive     TRIMETH/SULFA <=20 SENSITIVE Sensitive     AMPICILLIN/SULBACTAM <=2 SENSITIVE Sensitive     PIP/TAZO <=4 SENSITIVE Sensitive     * PROTEUS MIRABILIS  Blood Culture ID Panel (Reflexed)     Status:  Abnormal   Collection Time: 12/03/15 12:00 AM  Result Value Ref Range Status   Enterococcus species NOT DETECTED NOT DETECTED Final   Vancomycin resistance NOT DETECTED NOT DETECTED Final   Listeria monocytogenes NOT DETECTED NOT DETECTED Final   Staphylococcus species NOT DETECTED NOT DETECTED Final   Staphylococcus aureus NOT DETECTED NOT DETECTED Final   Methicillin resistance NOT DETECTED NOT DETECTED Final   Streptococcus species NOT DETECTED NOT DETECTED Final   Streptococcus agalactiae NOT DETECTED NOT DETECTED Final   Streptococcus pneumoniae NOT DETECTED NOT DETECTED Final   Streptococcus pyogenes NOT DETECTED NOT DETECTED Final   Acinetobacter baumannii NOT DETECTED NOT DETECTED Final   Enterobacteriaceae species DETECTED (A) NOT DETECTED Final    Comment: CALLED TO JASON ROBBINS AT 2035 12/04/15 MLZ    Enterobacter cloacae complex NOT DETECTED NOT DETECTED Final   Escherichia coli NOT DETECTED NOT DETECTED Final   Klebsiella oxytoca NOT DETECTED NOT DETECTED Final   Klebsiella pneumoniae NOT DETECTED NOT DETECTED Final   Proteus species DETECTED (A) NOT DETECTED Final    Comment: CALLED TO JASON ROBBINS AT 2035 12/04/15 MLZ    Serratia marcescens NOT DETECTED NOT DETECTED Final   Carbapenem resistance NOT DETECTED NOT DETECTED Final   Haemophilus influenzae NOT DETECTED NOT DETECTED Final   Neisseria meningitidis NOT DETECTED NOT DETECTED Final   Pseudomonas aeruginosa NOT DETECTED NOT DETECTED Final   Candida albicans NOT DETECTED NOT DETECTED Final   Candida glabrata NOT DETECTED NOT DETECTED Final   Candida krusei NOT DETECTED NOT DETECTED Final   Candida parapsilosis NOT DETECTED NOT DETECTED Final   Candida tropicalis NOT DETECTED NOT DETECTED Final  Urine culture     Status: None   Collection Time: 12/03/15  8:53 PM  Result Value Ref Range Status   Specimen Description URINE, RANDOM  Final   Special Requests NONE  Final   Culture >=100,000 COLONIES/mL PROTEUS  MIRABILIS  Final   Report Status 12/06/2015 FINAL  Final   Organism ID, Bacteria PROTEUS MIRABILIS  Final      Susceptibility   Proteus mirabilis - MIC*    AMPICILLIN <=2 SENSITIVE Sensitive     CEFAZOLIN <=4 SENSITIVE Sensitive     CEFTRIAXONE <=1 SENSITIVE Sensitive     CIPROFLOXACIN <=0.25 SENSITIVE Sensitive     GENTAMICIN <=1 SENSITIVE Sensitive     IMIPENEM 2 SENSITIVE Sensitive     NITROFURANTOIN 128 RESISTANT Resistant     TRIMETH/SULFA <=20 SENSITIVE Sensitive     AMPICILLIN/SULBACTAM <=2 SENSITIVE  Sensitive     PIP/TAZO <=4 SENSITIVE Sensitive     * >=100,000 COLONIES/mL PROTEUS MIRABILIS  Culture, blood (Routine X 2) w Reflex to ID Panel     Status: None   Collection Time: 12/03/15 11:45 PM  Result Value Ref Range Status   Specimen Description BLOOD LEFT ARM  Final   Special Requests   Final    BOTTLES DRAWN AEROBIC AND ANAEROBIC 10MLAEROBIC, 5MLANAEROBIC   Culture  Setup Time   Final    GRAM NEGATIVE RODS IN BOTH AEROBIC AND ANAEROBIC BOTTLES CRITICAL RESULT CALLED TO, READ BACK BY AND VERIFIED WITH: JASON ROBBINS AT 2035 12/04/15 MLZ     Culture   Final    PROTEUS MIRABILIS IN BOTH AEROBIC AND ANAEROBIC BOTTLES CONFIRMED BY RW     Report Status 12/06/2015 FINAL  Final   Organism ID, Bacteria PROTEUS MIRABILIS  Final      Susceptibility   Proteus mirabilis - MIC*    AMPICILLIN <=2 SENSITIVE Sensitive     CEFAZOLIN <=4 SENSITIVE Sensitive     CEFEPIME <=1 SENSITIVE Sensitive     CEFTAZIDIME <=1 SENSITIVE Sensitive     CEFTRIAXONE <=1 SENSITIVE Sensitive     CIPROFLOXACIN <=0.25 SENSITIVE Sensitive     GENTAMICIN <=1 SENSITIVE Sensitive     IMIPENEM 2 SENSITIVE Sensitive     TRIMETH/SULFA <=20 SENSITIVE Sensitive     AMPICILLIN/SULBACTAM <=2 SENSITIVE Sensitive     PIP/TAZO <=4 SENSITIVE Sensitive     * PROTEUS MIRABILIS  MRSA PCR Screening     Status: Abnormal   Collection Time: 12/04/15  1:11 AM  Result Value Ref Range Status   MRSA by PCR POSITIVE (A)  NEGATIVE Final    Comment:        The GeneXpert MRSA Assay (FDA approved for NASAL specimens only), is one component of a comprehensive MRSA colonization surveillance program. It is not intended to diagnose MRSA infection nor to guide or monitor treatment for MRSA infections. CRITICAL RESULT CALLED TO, READ BACK BY AND VERIFIED WITH: MICHELLE WILLIAMS AT 1287 ON 12/04/15 BY VAB   CULTURE, BLOOD (ROUTINE X 2) w Reflex to PCR ID Panel     Status: None (Preliminary result)   Collection Time: 12/07/15  9:52 AM  Result Value Ref Range Status   Specimen Description BLOOD LEFT ARM  Final   Special Requests   Final    BOTTLES DRAWN AEROBIC AND ANAEROBIC AER 4ML ANA 2ML   Culture NO GROWTH 3 DAYS  Final   Report Status PENDING  Incomplete  CULTURE, BLOOD (ROUTINE X 2) w Reflex to PCR ID Panel     Status: None (Preliminary result)   Collection Time: 12/07/15  9:58 AM  Result Value Ref Range Status   Specimen Description BLOOD RIGHT HAND  Final   Special Requests   Final    BOTTLES DRAWN AEROBIC AND ANAEROBIC AER 5ML ANA 2ML   Culture NO GROWTH 3 DAYS  Final   Report Status PENDING  Incomplete    Studies/Results: Dg Abd 1 View  12/09/2015  CLINICAL DATA:  Persistent leukocytosis.  Urosepsis. EXAM: ABDOMEN - 1 VIEW COMPARISON:  CT 10/27/2015 FINDINGS: Double-J ureteral stent in place on the right, grossly well positioned. Bowel gas pattern is normal without evidence of ileus or obstruction. Moderate amount of fecal matter throughout the colon. No abnormal calcifications or significant bony findings. IMPRESSION: Double-J ureteral stent on the right. Large amount of fecal matter. No other notable finding. Electronically Signed  By: Nelson Chimes M.D.   On: 12/09/2015 17:11    Assessment/Plan: April [name] is a 70 y.o. 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 Improving. Also has bil dry gangrene. Received  steroids only one day  and IVIG for TCP.  Urology has seen and suggests no further imaging.   Recommendations Change ceftriaxone to cipro 500 bid for a 21 day course.   She will need to see urology before this is stopped as with the stent in place her infection is likely to recur once abx are stopped.   No need to see me in followup unless urology requests.  The persistent WBC is likely also related to her dry gangrene and she will need to fu with vascular. Thank you very much for the consult.  I will sign off.   Tyler, [place]   12/10/2015, 9:52 AM

## 2015-12-10 NOTE — Evaluation (Addendum)
Physical Therapy Evaluation Patient Details Name: April [name] MRN: [id number] DOB: 1945/10/12 Today's Date: 12/10/2015   History of Present Illness  Pt admitted for complaints of spontaneous bleeding from vagina and lips. Pt now with thrombocytopenia. Pt with history of DM and recent cystoscopy with stent placement on 2/7. Pt with recent admission for septic shock and multi organ failure and has been at [facility].  Clinical Impression  Pt is a pleasant 70 year old female who was admitted for thrombocytopenia. Pt supine in bed upon arrival with feet uncovered. Pt with necrotic black gangrene toes with ankle/leg wrappings. Unsure of WB precautions at this time. She reports she has been unable to walk or stand on feet in over a week. Previously, she was ambulatory. Pt in severe pain at this time, limiting mobility attempts, however pt able to participate in bed mobility with assistance for donning/doffing bed pan. Pt demonstrates strength/pain/mobility/endurance deficits and is currently not at baseline level.  Would benefit from skilled PT to address above deficits and promote optimal return to PLOF.      Follow Up Recommendations SNF    Equipment Recommendations       Recommendations for Other Services       Precautions / Restrictions Precautions Precautions: Fall Restrictions Weight Bearing Restrictions: No      Mobility  Bed Mobility Overal bed mobility: Needs Assistance Bed Mobility: Rolling Rolling: Min assist         General bed mobility comments: able to perform rolling using bed rails and min assist from therapist. Pt unable to attempt EOB transistion as she needs to use bed pan and she is complaining of severe vagina pain  Transfers                 General transfer comment: unable at this time  Ambulation/Gait                Stairs            Wheelchair Mobility    Modified Rankin (Stroke Patients Only)       Balance                                              Pertinent Vitals/Pain Pain Assessment: Faces Faces Pain Scale: Hurts even more Pain Location: genital area Pain Descriptors / Indicators: Shooting;Sharp Pain Intervention(s): Limited activity within patient's tolerance    Home Living Family/patient expects to be discharged to:: Private residence Living Arrangements: Alone   Type of Home: House Home Access: Stairs to enter Entrance Stairs-Rails: Right Entrance Stairs-Number of Steps: 2 Home Layout: One level Home Equipment: None Additional Comments: is now at SNF     Prior Function Level of Independence: Needs assistance         Comments: at rehab, states she was standing and taking a few steps with therapy using rw     Hand Dominance        Extremity/Trunk Assessment   Upper Extremity Assessment: Generalized weakness (grossly 4/5)           Lower Extremity Assessment: Generalized weakness (grossly 3+/5)         Communication   Communication: No difficulties  Cognition Arousal/Alertness: Awake/alert Behavior During Therapy: WFL for tasks assessed/performed Overall Cognitive Status: Within Functional Limits for tasks assessed  General Comments      Exercises Other Exercises Other Exercises: Rolling to B sides performed in order for bed pan placement. Pt not able to bridge for assist.       Assessment/Plan    PT Assessment Patient needs continued PT services  PT Diagnosis Difficulty walking;Generalized weakness;Acute pain   PT Problem List Decreased strength;Decreased activity tolerance;Decreased balance;Decreased mobility;Pain  PT Treatment Interventions Gait training;Therapeutic exercise;Therapeutic activities;DME instruction   PT Goals (Current goals can be found in the Care Plan section) Acute Rehab PT Goals Patient Stated Goal: to get stronger PT Goal Formulation: With patient Time For Goal Achievement: 12/24/15 Potential  to Achieve Goals: Good    Frequency Min 2X/week   Barriers to discharge        Co-evaluation               End of Session   Activity Tolerance: Patient limited by pain Patient left: in bed;with bed alarm set;with call bell/phone within reach Nurse Communication: Mobility status         Time: 1610-9604 PT Time Calculation (min) (ACUTE ONLY): 14 min   Charges:   PT Evaluation $PT Eval Moderate Complexity: 1 Procedure PT Treatments $Therapeutic Activity: 8-22 mins   PT G Codes:        April [name] December 31, 2015, 1:34 PM  Greggory Stallion, PT, DPT [phone]

## 2015-12-10 NOTE — Progress Notes (Signed)
Report called to shirley, Therapist, sports at [place].

## 2015-12-10 NOTE — Discharge Instructions (Signed)
°  DIET:  Cardiac diet  DISCHARGE CONDITION:  Stable  ACTIVITY:  Activity as tolerated  OXYGEN:  Home Oxygen: 2l oxygen continues  Oxygen Delivery: portable oxgyen  DISCHARGE LOCATION:  nursing home    ADDITIONAL DISCHARGE INSTRUCTION:Dry dressings to bilateral feet and toes.  Kerlix between each toe.  Change daily.   If you experience worsening of your admission symptoms, develop shortness of breath, life threatening emergency, suicidal or homicidal thoughts you must seek medical attention immediately by calling 911 or calling your MD immediately  if symptoms less severe.  You Must read complete instructions/literature along with all the possible adverse reactions/side effects for all the Medicines you take and that have been prescribed to you. Take any new Medicines after you have completely understood and accpet all the possible adverse reactions/side effects.   Please note  You were cared for by a hospitalist during your hospital stay. If you have any questions about your discharge medications or the care you received while you were in the hospital after you are discharged, you can call the unit and asked to speak with the hospitalist on call if the hospitalist that took care of you is not available. Once you are discharged, your primary care physician will handle any further medical issues. Please note that NO REFILLS for any discharge medications will be authorized once you are discharged, as it is imperative that you return to your primary care physician (or establish a relationship with a primary care physician if you do not have one) for your aftercare needs so that they can reassess your need for medications and monitor your lab values.

## 2015-12-10 NOTE — Discharge Summary (Signed)
April [name], 70 y.o., DOB [birth year], MRN [id number]. 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 female who presents with some spontaneous bleeding. Patient was recently admitted here for a long stay with septic shock and multiorgan failure. She was discharged to the nursing facility. There she was noted  have a couple of prolonged nosebleeds, as well as some bleeding from her lips. She had blood work done and was found to have significantly low platelets. She was sent to the ED for further evaluation, and platelet count here was corroborated to be low at around 5000. Patient was admitted for further evaluation or treatment. She received transfusion for platelet. She was seen by hematology and treated for ITP with IV steroids and IVIG. With those therapies her platelet count started to improve drastically. She has no further bleeding noted. Leukocytosis patient was noted to have Proteus sepsis. And was treated with ceftriaxone. Her WBC count continued to stay elevated therefore infectious disease consult was obtained. Repeat blood cultures were negative. Patient was recommended to be continued on oral Cipro. Also another reason for her leukocytosis is her necrotic toes which she was noticed to have during her previous hospitalization. Patient has opted not to have amputation and therefore only supportive care is being provided for the  necrotic toes. Also during her previous hospitalization she had a stent laced therefore a urology consult was obtained. KUB was checked there was no abnormality noted. There recommended outpatient follow-up with the removal of the stent. The CBC check in the next 4 few days. To follow up on her  platelet count.             Consults  ID, and urology  Significant Tests:  See full reports for all details    Dg Abd 1 View  12/09/2015  CLINICAL DATA:  Persistent leukocytosis.  Urosepsis. EXAM: ABDOMEN - 1 VIEW COMPARISON:  CT 10/27/2015 FINDINGS: Double-J ureteral stent in place on the right, grossly well positioned. Bowel gas pattern is normal without evidence of ileus or obstruction. Moderate amount of fecal matter throughout the colon. No abnormal calcifications or significant bony findings. IMPRESSION: Double-J ureteral stent on the right. Large amount of fecal matter. No other notable finding. Electronically Signed   By: Nelson Chimes M.D.   On: 12/09/2015 17:11   Dg Chest Port 1 View  12/04/2015  CLINICAL DATA:  Leukocytosis.  Low platelets. EXAM: PORTABLE CHEST 1 VIEW COMPARISON:  11/04/2015 FINDINGS: Lung volumes are low. Cardiomediastinal contours are unchanged allowing for differences in technique. Mild bibasilar atelectasis. No confluent airspace disease. No large pleural effusion. No pneumothorax. Old left rib fractures. IMPRESSION: Hypoventilatory chest with mild bibasilar atelectasis Electronically Signed   By: Jeb Levering M.D.   On: 12/04/2015 01:13       Today   Subjective:   April [name]  C/o constipation, no cp or sob Objective:   Blood pressure 122/58, pulse 80, temperature 97.8 F (36.6  C), temperature source Oral, resp. rate 20, height '5\' 7"'$  (1.702 m), weight 102.7 kg (226 lb 6.6 oz), SpO2 98 %.  .  Intake/Output Summary (Last 24 hours) at 12/10/15 1321 Last data filed at 12/10/15 1218  Gross per 24 hour  Intake    409 ml  Output   1300 ml  Net   -891 ml     Exam VITAL SIGNS: Blood pressure 122/58, pulse 80, temperature 97.8 F (36.6 C), temperature source Oral, resp. rate 20, height '5\' 7"'$  (1.702 m), weight 102.7 kg (226 lb 6.6 oz), SpO2 98 %.  GENERAL:  70 y.o.-year-old patient lying in the bed with no acute distress.  EYES: Pupils equal, round, reactive to light and accommodation. No scleral icterus. Extraocular muscles intact.  HEENT: Head atraumatic, normocephalic. Oropharynx and nasopharynx clear.  NECK:  Supple, no jugular venous distention. No thyroid enlargement, no tenderness.  LUNGS: Normal breath sounds bilaterally, no wheezing, rales,rhonchi or crepitation. No use of accessory muscles of respiration.  CARDIOVASCULAR: S1, S2 normal. No murmurs, rubs, or gallops.  ABDOMEN: Soft, nontender, nondistended. Bowel sounds present. No organomegaly or mass.  EXTREMITIES: No pedal edema, cyanosis, or clubbing. B/l necrotic toes  NEUROLOGIC: Cranial nerves II through XII are intact. Muscle strength 5/5 in all extremities. Sensation intact. Gait not checked.  PSYCHIATRIC: The patient is alert and oriented x 3.  SKIN: No obvious rash, lesion, or ulcer.   Data Review     CBC w Diff: Lab Results  Component Value Date   WBC 21.8* 12/10/2015   HGB 10.1* 12/10/2015   HCT 29.4* 12/10/2015   PLT 123* 12/10/2015   LYMPHOPCT 9 12/10/2015   BANDSPCT 4 12/09/2015   MONOPCT 8 12/10/2015   EOSPCT 1 12/10/2015   BASOPCT 0 12/10/2015   CMP: Lab Results  Component Value Date   NA 131* 12/08/2015   K 3.9 12/08/2015   CL 99* 12/08/2015   CO2 26 12/08/2015   BUN 29* 12/08/2015   CREATININE 0.62 12/08/2015   PROT 5.4* 11/03/2015   ALBUMIN 1.6* 11/03/2015   BILITOT 0.7 11/03/2015   ALKPHOS 87 11/03/2015   AST 20 11/03/2015   ALT 26 11/03/2015  .  Micro Results Recent Results (from the past 240 hour(s))  Culture, blood (Routine X 2) w Reflex to ID Panel     Status: None   Collection Time: 12/03/15 12:00 AM  Result Value Ref Range Status    Specimen Description BLOOD LEFT ARM  Final   Special Requests BOTTLES DRAWN AEROBIC AND ANAEROBIC 5ML  Final   Culture  Setup Time   Final    GRAM NEGATIVE RODS IN BOTH AEROBIC AND ANAEROBIC BOTTLES CRITICAL RESULT CALLED TO, READ BACK BY AND VERIFIED WITH: JASON ROBBINS AT 2035 12/04/15 MLZ     Culture   Final    PROTEUS MIRABILIS IN BOTH AEROBIC AND ANAEROBIC BOTTLES CONFIRMED BY RW    Report Status 12/06/2015 FINAL  Final   Organism ID, Bacteria PROTEUS MIRABILIS  Final      Susceptibility   Proteus mirabilis - MIC*    AMPICILLIN <=2 SENSITIVE Sensitive     CEFAZOLIN <=4 SENSITIVE Sensitive     CEFEPIME <=1 SENSITIVE Sensitive     CEFTAZIDIME <=1 SENSITIVE Sensitive     CEFTRIAXONE <=1 SENSITIVE Sensitive     CIPROFLOXACIN <=0.25 SENSITIVE Sensitive     GENTAMICIN <=1 SENSITIVE Sensitive     IMIPENEM 2 SENSITIVE Sensitive     TRIMETH/SULFA <=20 SENSITIVE Sensitive  AMPICILLIN/SULBACTAM <=2 SENSITIVE Sensitive     PIP/TAZO <=4 SENSITIVE Sensitive     * PROTEUS MIRABILIS  Blood Culture ID Panel (Reflexed)     Status: Abnormal   Collection Time: 12/03/15 12:00 AM  Result Value Ref Range Status   Enterococcus species NOT DETECTED NOT DETECTED Final   Vancomycin resistance NOT DETECTED NOT DETECTED Final   Listeria monocytogenes NOT DETECTED NOT DETECTED Final   Staphylococcus species NOT DETECTED NOT DETECTED Final   Staphylococcus aureus NOT DETECTED NOT DETECTED Final   Methicillin resistance NOT DETECTED NOT DETECTED Final   Streptococcus species NOT DETECTED NOT DETECTED Final   Streptococcus agalactiae NOT DETECTED NOT DETECTED Final   Streptococcus pneumoniae NOT DETECTED NOT DETECTED Final   Streptococcus pyogenes NOT DETECTED NOT DETECTED Final   Acinetobacter baumannii NOT DETECTED NOT DETECTED Final   Enterobacteriaceae species DETECTED (A) NOT DETECTED Final    Comment: CALLED TO JASON ROBBINS AT 2035 12/04/15 MLZ    Enterobacter cloacae complex NOT DETECTED  NOT DETECTED Final   Escherichia coli NOT DETECTED NOT DETECTED Final   Klebsiella oxytoca NOT DETECTED NOT DETECTED Final   Klebsiella pneumoniae NOT DETECTED NOT DETECTED Final   Proteus species DETECTED (A) NOT DETECTED Final    Comment: CALLED TO JASON ROBBINS AT 2035 12/04/15 MLZ    Serratia marcescens NOT DETECTED NOT DETECTED Final   Carbapenem resistance NOT DETECTED NOT DETECTED Final   Haemophilus influenzae NOT DETECTED NOT DETECTED Final   Neisseria meningitidis NOT DETECTED NOT DETECTED Final   Pseudomonas aeruginosa NOT DETECTED NOT DETECTED Final   Candida albicans NOT DETECTED NOT DETECTED Final   Candida glabrata NOT DETECTED NOT DETECTED Final   Candida krusei NOT DETECTED NOT DETECTED Final   Candida parapsilosis NOT DETECTED NOT DETECTED Final   Candida tropicalis NOT DETECTED NOT DETECTED Final  Urine culture     Status: None   Collection Time: 12/03/15  8:53 PM  Result Value Ref Range Status   Specimen Description URINE, RANDOM  Final   Special Requests NONE  Final   Culture >=100,000 COLONIES/mL PROTEUS MIRABILIS  Final   Report Status 12/06/2015 FINAL  Final   Organism ID, Bacteria PROTEUS MIRABILIS  Final      Susceptibility   Proteus mirabilis - MIC*    AMPICILLIN <=2 SENSITIVE Sensitive     CEFAZOLIN <=4 SENSITIVE Sensitive     CEFTRIAXONE <=1 SENSITIVE Sensitive     CIPROFLOXACIN <=0.25 SENSITIVE Sensitive     GENTAMICIN <=1 SENSITIVE Sensitive     IMIPENEM 2 SENSITIVE Sensitive     NITROFURANTOIN 128 RESISTANT Resistant     TRIMETH/SULFA <=20 SENSITIVE Sensitive     AMPICILLIN/SULBACTAM <=2 SENSITIVE Sensitive     PIP/TAZO <=4 SENSITIVE Sensitive     * >=100,000 COLONIES/mL PROTEUS MIRABILIS  Culture, blood (Routine X 2) w Reflex to ID Panel     Status: None   Collection Time: 12/03/15 11:45 PM  Result Value Ref Range Status   Specimen Description BLOOD LEFT ARM  Final   Special Requests   Final    BOTTLES DRAWN AEROBIC AND ANAEROBIC 10MLAEROBIC,  5MLANAEROBIC   Culture  Setup Time   Final    GRAM NEGATIVE RODS IN BOTH AEROBIC AND ANAEROBIC BOTTLES CRITICAL RESULT CALLED TO, READ BACK BY AND VERIFIED WITH: JASON ROBBINS AT 2035 12/04/15 MLZ     Culture   Final    PROTEUS MIRABILIS IN BOTH AEROBIC AND ANAEROBIC BOTTLES CONFIRMED BY RW     Report  Status 12/06/2015 FINAL  Final   Organism ID, Bacteria PROTEUS MIRABILIS  Final      Susceptibility   Proteus mirabilis - MIC*    AMPICILLIN <=2 SENSITIVE Sensitive     CEFAZOLIN <=4 SENSITIVE Sensitive     CEFEPIME <=1 SENSITIVE Sensitive     CEFTAZIDIME <=1 SENSITIVE Sensitive     CEFTRIAXONE <=1 SENSITIVE Sensitive     CIPROFLOXACIN <=0.25 SENSITIVE Sensitive     GENTAMICIN <=1 SENSITIVE Sensitive     IMIPENEM 2 SENSITIVE Sensitive     TRIMETH/SULFA <=20 SENSITIVE Sensitive     AMPICILLIN/SULBACTAM <=2 SENSITIVE Sensitive     PIP/TAZO <=4 SENSITIVE Sensitive     * PROTEUS MIRABILIS  MRSA PCR Screening     Status: Abnormal   Collection Time: 12/04/15  1:11 AM  Result Value Ref Range Status   MRSA by PCR POSITIVE (A) NEGATIVE Final    Comment:        The GeneXpert MRSA Assay (FDA approved for NASAL specimens only), is one component of a comprehensive MRSA colonization surveillance program. It is not intended to diagnose MRSA infection nor to guide or monitor treatment for MRSA infections. CRITICAL RESULT CALLED TO, READ BACK BY AND VERIFIED WITH: MICHELLE WILLIAMS AT 7619 ON 12/04/15 BY VAB   CULTURE, BLOOD (ROUTINE X 2) w Reflex to PCR ID Panel     Status: None (Preliminary result)   Collection Time: 12/07/15  9:52 AM  Result Value Ref Range Status   Specimen Description BLOOD LEFT ARM  Final   Special Requests   Final    BOTTLES DRAWN AEROBIC AND ANAEROBIC AER 4ML ANA 2ML   Culture NO GROWTH 3 DAYS  Final   Report Status PENDING  Incomplete  CULTURE, BLOOD (ROUTINE X 2) w Reflex to PCR ID Panel     Status: None (Preliminary result)   Collection Time: 12/07/15  9:58  AM  Result Value Ref Range Status   Specimen Description BLOOD RIGHT HAND  Final   Special Requests   Final    BOTTLES DRAWN AEROBIC AND ANAEROBIC AER 5ML ANA 2ML   Culture NO GROWTH 3 DAYS  Final   Report Status PENDING  Incomplete        Code Status Orders        Start     Ordered   12/04/15 0101  Full code   Continuous     12/04/15 0100    Code Status History    Date Active Date Inactive Code Status Order ID Comments User Context   10/31/2015 10:13 PM 11/09/2015  6:03 PM Full Code [id number]  Colleen Can, MD Inpatient   10/30/2015  3:10 PM 10/31/2015 10:13 PM DNR [id number]  Colleen Can, MD Inpatient   10/30/2015  2:18 PM 10/30/2015  3:10 PM DNR [id number]  Colleen Can, MD Inpatient   10/20/2015  5:14 PM 10/30/2015  2:18 PM Full Code [id number]  Fritzi Mandes, MD Inpatient          Follow-up Information    Follow up with [facility].   Specialty:  [place] information:   [address] [place] [phone]      Follow up with Hollice Espy, MD In 10 days.   Specialty:  Urology   Contact information:   [place] [address] [phone]       Follow up with DEW,JASON, MD In 2 weeks.   Specialties:  Vascular Surgery, Radiology, Interventional Cardiology   Why:  gangrene toe   Contact information:   [place] [address] [phone]       Discharge Medications     Medication List    STOP taking these medications        furosemide 20 MG tablet  Commonly known as:  LASIX     potassium chloride 10 MEQ tablet  Commonly known as:  K-DUR      TAKE these medications        acetaminophen 500 MG tablet  Commonly known as:  TYLENOL  Take 1,000 mg by mouth every 8 (eight) hours as needed for moderate pain.     antiseptic oral rinse 0.05 % Liqd solution  Commonly known as:  CPC / CETYLPYRIDINIUM CHLORIDE 0.05%  7 mLs by Mouth  Rinse route 2 (two) times daily.     bisacodyl 10 MG suppository  Commonly known as:  DULCOLAX  Place 1 suppository (10 mg total) rectally daily as needed for moderate constipation.     ciprofloxacin 500 MG tablet  Commonly known as:  CIPRO  Take 1 tablet (500 mg total) by mouth 2 (two) times daily.     diclofenac sodium 1 % Gel  Commonly known as:  VOLTAREN  Apply 4 g topically 4 (four) times daily.     docusate sodium 100 MG capsule  Commonly known as:  COLACE  Take 1 capsule (100 mg total) by mouth 2 (two) times daily.     insulin glargine 100 UNIT/ML injection  Commonly known as:  LANTUS  Inject 0.15 mLs (15 Units total) into the skin daily.     ipratropium-albuterol 0.5-2.5 (3) MG/3ML Soln  Commonly known as:  DUONEB  Take 3 mLs by nebulization every 6 (six) hours as needed.     metoprolol tartrate 25 MG tablet  Commonly known as:  LOPRESSOR  Take 1 tablet (25 mg total) by mouth 2 (two) times daily.     ondansetron 4 MG tablet  Commonly known as:  ZOFRAN  Take 1 tablet (4 mg total) by mouth every 6 (six) hours as needed for nausea.     oxyCODONE 5 MG immediate release tablet  Commonly known as:  Oxy IR/ROXICODONE  Take 1 tablet (5 mg total) by mouth every 6 (six) hours as needed for moderate pain or severe pain.     polyethylene glycol packet  Commonly known as:  MIRALAX / GLYCOLAX  Take 17 g by mouth daily.     QUEtiapine 25 MG tablet  Commonly known as:  SEROQUEL  Take 1 tablet (25 mg total) by mouth at bedtime.     tamsulosin 0.4 MG Caps capsule  Commonly known as:  FLOMAX  Take 1 capsule (0.4 mg total) by mouth daily.     traZODone 50 MG tablet  Commonly known as:  DESYREL  Take 50 mg by mouth at bedtime.           Total Time in preparing paper work, data evaluation and todays exam - 35 minutes  Dustin Flock M.D on 12/10/2015 at 1:21 PM  [facility]   Office  [phone]

## 2015-12-10 NOTE — Clinical Social Work Note (Signed)
[employer] Josem Kaufmann has been received by [facility]. Discharge information sent and nurse to call report. Patient is alert and oriented X4. Patient to transport via EMS. Shela Leff MSW,LCSW [phone]

## 2015-12-10 NOTE — Progress Notes (Signed)
Patient refused and educated about lactulose. Enema given but patient unable  to hold it inside rectum  for few minutes. Wanted  Bedpan promptly. Some amount of enema came right out.

## 2015-12-10 NOTE — Progress Notes (Signed)
EMS called.

## 2015-12-10 NOTE — Consult Note (Addendum)
Urology Consult  I have been asked to see the patient by Dr. Ola Spurr for evaluation and management of right hydonephrosis  Chief Complaint: right flank pain  History of Present Illness: April [name] is a 70 y.o. 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  Stent placement was technically very difficult due to her habitus and severe anasarca at the time. Retrogrades were unable to be performed. Her hospital course was prolonged and complicated by bilateral lower extremity gangrene.  Unfortunately, she was readmitted with severe ITP, thrombocytopenia. She has received IVIG and prednisone during this hospitalization. She was also noted to have recurrent urinary tract infection again growing Proteus in her urine.  Her renal function has been normal.   She does complain of right flank pain which was exacerbated by working with physical therapy. She does not currently have a Foley catheter in her bladder.  KUB confirms stent in good position.  Past Medical History  Diagnosis Date  . Renal disorder   . NSTEMI (non-ST elevated myocardial infarction) ([place])   . Acute renal failure (ARF) ([place])   . Type 2 diabetes mellitus ([place])   . Paroxysmal atrial fibrillation [facility])     Past Surgical History  Procedure Laterality Date  . Cystoscopy with stent placement Right 11/03/2015    Procedure: CYSTOSCOPY WITH STENT PLACEMENT;  Surgeon: Hollice Espy, MD;  Location: [facility] ORS;  Service: Urology;  Laterality: Right;  . Tonsillectomy      Home Medications:    Medication List    ASK your doctor about these medications        acetaminophen 500 MG tablet  Commonly known as:  TYLENOL  Take 1,000 mg by mouth every 8 (eight) hours as needed for moderate pain.     bisacodyl 10 MG suppository  Commonly known as:  DULCOLAX  Place 1  suppository (10 mg total) rectally daily as needed for moderate constipation.     diclofenac sodium 1 % Gel  Commonly known as:  VOLTAREN  Apply 4 g topically 4 (four) times daily.     furosemide 20 MG tablet  Commonly known as:  LASIX  Take 2 tablets (40 mg total) by mouth daily.     insulin glargine 100 UNIT/ML injection  Commonly known as:  LANTUS  Inject 0.15 mLs (15 Units total) into the skin daily.     ipratropium-albuterol 0.5-2.5 (3) MG/3ML Soln  Commonly known as:  DUONEB  Take 3 mLs by nebulization every 6 (six) hours as needed.     metoprolol tartrate 25 MG tablet  Commonly known as:  LOPRESSOR  Take 1 tablet (25 mg total) by mouth 2 (two) times daily.     potassium chloride 10 MEQ tablet  Commonly known as:  K-DUR  Take 1 tablet (10 mEq total) by mouth daily.     traZODone 50 MG tablet  Commonly known as:  DESYREL  Take 50 mg by mouth at bedtime.        Allergies: No Active Allergies  Family History  Problem Relation Age of Onset  . Diabetes Father   . Stroke Maternal Grandmother   . Heart disease Father     Social History:  reports that she has never smoked. She does not have any smokeless tobacco history on file. She reports that she does not drink alcohol or use illicit drugs.  ROS: A complete review of systems was performed.  Today, she complains of constipation, right flank pain, and an episode of vaginal bleeding.  All other systems are negative except for pertinent findings as noted.  Physical Exam:  Vital signs in last 24 hours: Temp:  [97.8 F (36.6 C)-97.9 F (36.6 C)] 97.8 F (36.6 C) (03/16 0503) Pulse Rate:  [80-91] 80 (03/16 0503) Resp:  [20] 20 (03/16 0503) BP: (116-126)/(58-64) 122/58 mmHg (03/16 0503) SpO2:  [98 %] 98 % (03/16 0503) Constitutional:  Alert and oriented, No acute distress HEENT: [place] AT, moist mucus membranes.  Trachea midline, no masses Cardiovascular: Regular rate and rhythm. Respiratory: Normal respiratory  effort.  No respiratory distress. GI: Abdomen is soft, nontender, nondistended, no abdominal masses.  Obese. GU: No CVA tenderness.   Skin: No rashes, bruises or suspicious lesions Neurologic: Grossly intact, no focal deficits, moving all 4 extremities Psychiatric: Normal mood and affect Ext: Bilateral gangrene of toes   Laboratory Data:   Recent Labs  12/08/15 0400 12/09/15 0431 12/10/15 0449  WBC 25.9* 21.7* 21.8*  HGB 9.6* 10.2* 10.1*  HCT 27.9* 29.7* 29.4*    Recent Labs  12/08/15 0400  NA 131*  K 3.9  CL 99*  CO2 26  GLUCOSE 160*  BUN 29*  CREATININE 0.62  CALCIUM 8.5*   No results for input(s): LABPT, INR in the last 72 hours. No results for input(s): LABURIN in the last 72 hours. Results for orders placed or performed during the hospital encounter of 12/03/15  Culture, blood (Routine X 2) w Reflex to ID Panel     Status: None   Collection Time: 12/03/15 12:00 AM  Result Value Ref Range Status   Specimen Description BLOOD LEFT ARM  Final   Special Requests BOTTLES DRAWN AEROBIC AND ANAEROBIC 5ML  Final   Culture  Setup Time   Final    GRAM NEGATIVE RODS IN BOTH AEROBIC AND ANAEROBIC BOTTLES CRITICAL RESULT CALLED TO, READ BACK BY AND VERIFIED WITH: JASON ROBBINS AT 2035 12/04/15 MLZ     Culture   Final    PROTEUS MIRABILIS IN BOTH AEROBIC AND ANAEROBIC BOTTLES CONFIRMED BY RW    Report Status 12/06/2015 FINAL  Final   Organism ID, Bacteria PROTEUS MIRABILIS  Final      Susceptibility   Proteus mirabilis - MIC*    AMPICILLIN <=2 SENSITIVE Sensitive     CEFAZOLIN <=4 SENSITIVE Sensitive     CEFEPIME <=1 SENSITIVE Sensitive     CEFTAZIDIME <=1 SENSITIVE Sensitive     CEFTRIAXONE <=1 SENSITIVE Sensitive     CIPROFLOXACIN <=0.25 SENSITIVE Sensitive     GENTAMICIN <=1 SENSITIVE Sensitive     IMIPENEM 2 SENSITIVE Sensitive     TRIMETH/SULFA <=20 SENSITIVE Sensitive     AMPICILLIN/SULBACTAM <=2 SENSITIVE Sensitive     PIP/TAZO <=4 SENSITIVE Sensitive      * PROTEUS MIRABILIS  Blood Culture ID Panel (Reflexed)     Status: Abnormal   Collection Time: 12/03/15 12:00 AM  Result Value Ref Range Status   Enterococcus species NOT DETECTED NOT DETECTED Final   Vancomycin resistance NOT DETECTED NOT DETECTED Final   Listeria monocytogenes NOT DETECTED NOT DETECTED Final   Staphylococcus species NOT DETECTED NOT DETECTED Final   Staphylococcus aureus NOT DETECTED NOT DETECTED Final   Methicillin resistance NOT DETECTED NOT DETECTED Final   Streptococcus species NOT DETECTED NOT DETECTED Final   Streptococcus agalactiae NOT DETECTED NOT DETECTED Final   Streptococcus pneumoniae NOT DETECTED NOT  DETECTED Final   Streptococcus pyogenes NOT DETECTED NOT DETECTED Final   Acinetobacter baumannii NOT DETECTED NOT DETECTED Final   Enterobacteriaceae species DETECTED (A) NOT DETECTED Final    Comment: CALLED TO JASON ROBBINS AT 2035 12/04/15 MLZ    Enterobacter cloacae complex NOT DETECTED NOT DETECTED Final   Escherichia coli NOT DETECTED NOT DETECTED Final   Klebsiella oxytoca NOT DETECTED NOT DETECTED Final   Klebsiella pneumoniae NOT DETECTED NOT DETECTED Final   Proteus species DETECTED (A) NOT DETECTED Final    Comment: CALLED TO JASON ROBBINS AT 2035 12/04/15 MLZ    Serratia marcescens NOT DETECTED NOT DETECTED Final   Carbapenem resistance NOT DETECTED NOT DETECTED Final   Haemophilus influenzae NOT DETECTED NOT DETECTED Final   Neisseria meningitidis NOT DETECTED NOT DETECTED Final   Pseudomonas aeruginosa NOT DETECTED NOT DETECTED Final   Candida albicans NOT DETECTED NOT DETECTED Final   Candida glabrata NOT DETECTED NOT DETECTED Final   Candida krusei NOT DETECTED NOT DETECTED Final   Candida parapsilosis NOT DETECTED NOT DETECTED Final   Candida tropicalis NOT DETECTED NOT DETECTED Final  Urine culture     Status: None   Collection Time: 12/03/15  8:53 PM  Result Value Ref Range Status   Specimen Description URINE, RANDOM  Final   Special  Requests NONE  Final   Culture >=100,000 COLONIES/mL PROTEUS MIRABILIS  Final   Report Status 12/06/2015 FINAL  Final   Organism ID, Bacteria PROTEUS MIRABILIS  Final      Susceptibility   Proteus mirabilis - MIC*    AMPICILLIN <=2 SENSITIVE Sensitive     CEFAZOLIN <=4 SENSITIVE Sensitive     CEFTRIAXONE <=1 SENSITIVE Sensitive     CIPROFLOXACIN <=0.25 SENSITIVE Sensitive     GENTAMICIN <=1 SENSITIVE Sensitive     IMIPENEM 2 SENSITIVE Sensitive     NITROFURANTOIN 128 RESISTANT Resistant     TRIMETH/SULFA <=20 SENSITIVE Sensitive     AMPICILLIN/SULBACTAM <=2 SENSITIVE Sensitive     PIP/TAZO <=4 SENSITIVE Sensitive     * >=100,000 COLONIES/mL PROTEUS MIRABILIS  Culture, blood (Routine X 2) w Reflex to ID Panel     Status: None   Collection Time: 12/03/15 11:45 PM  Result Value Ref Range Status   Specimen Description BLOOD LEFT ARM  Final   Special Requests   Final    BOTTLES DRAWN AEROBIC AND ANAEROBIC 10MLAEROBIC, 5MLANAEROBIC   Culture  Setup Time   Final    GRAM NEGATIVE RODS IN BOTH AEROBIC AND ANAEROBIC BOTTLES CRITICAL RESULT CALLED TO, READ BACK BY AND VERIFIED WITH: JASON ROBBINS AT 2035 12/04/15 MLZ     Culture   Final    PROTEUS MIRABILIS IN BOTH AEROBIC AND ANAEROBIC BOTTLES CONFIRMED BY RW     Report Status 12/06/2015 FINAL  Final   Organism ID, Bacteria PROTEUS MIRABILIS  Final      Susceptibility   Proteus mirabilis - MIC*    AMPICILLIN <=2 SENSITIVE Sensitive     CEFAZOLIN <=4 SENSITIVE Sensitive     CEFEPIME <=1 SENSITIVE Sensitive     CEFTAZIDIME <=1 SENSITIVE Sensitive     CEFTRIAXONE <=1 SENSITIVE Sensitive     CIPROFLOXACIN <=0.25 SENSITIVE Sensitive     GENTAMICIN <=1 SENSITIVE Sensitive     IMIPENEM 2 SENSITIVE Sensitive     TRIMETH/SULFA <=20 SENSITIVE Sensitive     AMPICILLIN/SULBACTAM <=2 SENSITIVE Sensitive     PIP/TAZO <=4 SENSITIVE Sensitive     * PROTEUS MIRABILIS  MRSA PCR Screening  Status: Abnormal   Collection Time: 12/04/15  1:11 AM    Result Value Ref Range Status   MRSA by PCR POSITIVE (A) NEGATIVE Final    Comment:        The GeneXpert MRSA Assay (FDA approved for NASAL specimens only), is one component of a comprehensive MRSA colonization surveillance program. It is not intended to diagnose MRSA infection nor to guide or monitor treatment for MRSA infections. CRITICAL RESULT CALLED TO, READ BACK BY AND VERIFIED WITH: MICHELLE WILLIAMS AT 0241 ON 12/04/15 BY VAB   CULTURE, BLOOD (ROUTINE X 2) w Reflex to PCR ID Panel     Status: None (Preliminary result)   Collection Time: 12/07/15  9:52 AM  Result Value Ref Range Status   Specimen Description BLOOD LEFT ARM  Final   Special Requests   Final    BOTTLES DRAWN AEROBIC AND ANAEROBIC AER 4ML ANA 2ML   Culture NO GROWTH 3 DAYS  Final   Report Status PENDING  Incomplete  CULTURE, BLOOD (ROUTINE X 2) w Reflex to PCR ID Panel     Status: None (Preliminary result)   Collection Time: 12/07/15  9:58 AM  Result Value Ref Range Status   Specimen Description BLOOD RIGHT HAND  Final   Special Requests   Final    BOTTLES DRAWN AEROBIC AND ANAEROBIC AER 5ML ANA 2ML   Culture NO GROWTH 3 DAYS  Final   Report Status PENDING  Incomplete     Radiologic Imaging: Dg Abd 1 View  12/09/2015  CLINICAL DATA:  Persistent leukocytosis.  Urosepsis. EXAM: ABDOMEN - 1 VIEW COMPARISON:  CT 10/27/2015 FINDINGS: Double-J ureteral stent in place on the right, grossly well positioned. Bowel gas pattern is normal without evidence of ileus or obstruction. Moderate amount of fecal matter throughout the colon. No abnormal calcifications or significant bony findings. IMPRESSION: Double-J ureteral stent on the right. Large amount of fecal matter. No other notable finding. Electronically Signed   By: Nelson Chimes M.D.   On: 12/09/2015 17:11    Impression/Assessment:   1. Proteus UTI- . Proteus UTI, on IV antibiotics (ceftriaxone), ID following  2. 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 S/p right ureteral stent placement. Stent in good position on KUB.  3. Right flank pain- secondary to ureteral stent/ possibly pyelonephritis  4. Constipation   Plan:  -outpatient f/u to discuss stent removal vs. Stent exchange -agree with ID/ IV abx -no further imaging of kidneys needed at this time as clinically improving with abx and stented -consider addition of flomax to help with stent pain as needed -aggressive bowel regimen for consiptation  12/10/2015, 8:58 AM  Hollice Espy,  MD  Thank you for involving me in this patient's care.  Please page with any further questions or concerns.

## 2015-12-11 DIAGNOSIS — E119 Type 2 diabetes mellitus without complications: Secondary | ICD-10-CM | POA: Diagnosis not present

## 2015-12-11 DIAGNOSIS — D696 Thrombocytopenia, unspecified: Secondary | ICD-10-CM | POA: Diagnosis present

## 2015-12-11 DIAGNOSIS — Z794 Long term (current) use of insulin: Secondary | ICD-10-CM | POA: Diagnosis present

## 2015-12-11 LAB — GLUCOSE, CAPILLARY
GLUCOSE-CAPILLARY: 201 mg/dL — AB (ref 65–99)
GLUCOSE-CAPILLARY: 133 mg/dL — AB (ref 65–99)
Glucose-Capillary: 142 mg/dL — ABNORMAL HIGH (ref 65–99)
Glucose-Capillary: 147 mg/dL — ABNORMAL HIGH (ref 65–99)

## 2015-12-12 DIAGNOSIS — E119 Type 2 diabetes mellitus without complications: Secondary | ICD-10-CM | POA: Diagnosis not present

## 2015-12-12 LAB — CULTURE, BLOOD (ROUTINE X 2)
Culture: NO GROWTH
Culture: NO GROWTH

## 2015-12-12 LAB — GLUCOSE, CAPILLARY: GLUCOSE-CAPILLARY: 183 mg/dL — AB (ref 65–99)

## 2015-12-12 NOTE — Telephone Encounter (Signed)
We will see this patient.  No need to send to [facility].  Please arrange follow up with me.    Hollice Espy, MD

## 2015-12-13 LAB — GLUCOSE, CAPILLARY
GLUCOSE-CAPILLARY: 154 mg/dL — AB (ref 65–99)
GLUCOSE-CAPILLARY: 167 mg/dL — AB (ref 65–99)
GLUCOSE-CAPILLARY: 165 mg/dL — AB (ref 65–99)
GLUCOSE-CAPILLARY: 155 mg/dL — AB (ref 65–99)
Glucose-Capillary: 124 mg/dL — ABNORMAL HIGH (ref 65–99)
Glucose-Capillary: 197 mg/dL — ABNORMAL HIGH (ref 65–99)
Glucose-Capillary: 160 mg/dL — ABNORMAL HIGH (ref 65–99)
Glucose-Capillary: 122 mg/dL — ABNORMAL HIGH (ref 65–99)

## 2015-12-14 DIAGNOSIS — E119 Type 2 diabetes mellitus without complications: Secondary | ICD-10-CM | POA: Diagnosis not present

## 2015-12-14 LAB — CBC WITH DIFFERENTIAL/PLATELET
BASOS PCT: 1 %
Basophils Absolute: 0.1 10*3/uL (ref 0–0.1)
EOS ABS: 0.2 10*3/uL (ref 0–0.7)
Eosinophils Relative: 1 %
HCT: 28 % — ABNORMAL LOW (ref 35.0–47.0)
HEMOGLOBIN: 9.3 g/dL — AB (ref 12.0–16.0)
Lymphocytes Relative: 13 %
Lymphs Abs: 2.1 10*3/uL (ref 1.0–3.6)
MCH: 30.1 pg (ref 26.0–34.0)
MCHC: 33.3 g/dL (ref 32.0–36.0)
MCV: 90.3 fL (ref 80.0–100.0)
Monocytes Absolute: 2 10*3/uL — ABNORMAL HIGH (ref 0.2–0.9)
Monocytes Relative: 12 %
NEUTROS PCT: 73 %
Neutro Abs: 12.2 10*3/uL — ABNORMAL HIGH (ref 1.4–6.5)
Platelets: 367 10*3/uL (ref 150–440)
RBC: 3.11 MIL/uL — AB (ref 3.80–5.20)
RDW: 16.6 % — ABNORMAL HIGH (ref 11.5–14.5)
WBC: 16.6 10*3/uL — AB (ref 3.6–11.0)

## 2015-12-15 DIAGNOSIS — E119 Type 2 diabetes mellitus without complications: Secondary | ICD-10-CM | POA: Diagnosis not present

## 2015-12-15 LAB — GLUCOSE, CAPILLARY
GLUCOSE-CAPILLARY: 110 mg/dL — AB (ref 65–99)
GLUCOSE-CAPILLARY: 148 mg/dL — AB (ref 65–99)
GLUCOSE-CAPILLARY: 113 mg/dL — AB (ref 65–99)
Glucose-Capillary: 176 mg/dL — ABNORMAL HIGH (ref 65–99)
Glucose-Capillary: 120 mg/dL — ABNORMAL HIGH (ref 65–99)

## 2015-12-16 ENCOUNTER — Encounter: Payer: Self-pay | Admitting: Urology

## 2015-12-16 ENCOUNTER — Ambulatory Visit (INDEPENDENT_AMBULATORY_CARE_PROVIDER_SITE_OTHER): Payer: Medicare HMO | Admitting: Urology

## 2015-12-16 VITALS — BP 108/72 | HR 91 | Ht 67.0 in | Wt 220.0 lb

## 2015-12-16 DIAGNOSIS — N1330 Unspecified hydronephrosis: Secondary | ICD-10-CM

## 2015-12-16 DIAGNOSIS — A419 Sepsis, unspecified organism: Secondary | ICD-10-CM

## 2015-12-16 DIAGNOSIS — N390 Urinary tract infection, site not specified: Secondary | ICD-10-CM

## 2015-12-16 DIAGNOSIS — N200 Calculus of kidney: Secondary | ICD-10-CM | POA: Diagnosis not present

## 2015-12-16 LAB — GLUCOSE, CAPILLARY
GLUCOSE-CAPILLARY: 115 mg/dL — AB (ref 65–99)
GLUCOSE-CAPILLARY: 88 mg/dL (ref 65–99)
Glucose-Capillary: 160 mg/dL — ABNORMAL HIGH (ref 65–99)
Glucose-Capillary: 150 mg/dL — ABNORMAL HIGH (ref 65–99)
Glucose-Capillary: 190 mg/dL — ABNORMAL HIGH (ref 65–99)
Glucose-Capillary: 144 mg/dL — ABNORMAL HIGH (ref 65–99)

## 2015-12-16 NOTE — Progress Notes (Signed)
12/16/2015 5:40 PM   Melene Plan Oct 05, 1945 [id number]  Referring provider: Albina Billet, MD [address]   [place], [place] [address]  Chief Complaint  Patient presents with  . UPJ    New Patient    HPI: 70 year old female with complex history recently found down admitted for severe urosepsis (Proteus) in the ICU on pressors. 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  Note, stent placement was extremely technically difficult due to her habitus and severe anasarca and retrograde pyelogram circumflex unable to be performed.  She was recently readmitted with severe ITP, thrombocytopenia and recurrent Proteus urinary tract infection. She was ultimately discharged on a prolonged course of ciprofloxacin.  She returns to clinic to discuss management of her stent. She does endorse some flank pain especially with physical activity and working with physical therapy.  His voiding without difficulty.   Upon further questioning today, patient reports that she had no history of right flank pain previously. She is never been told that her right kidney was swollen. Her renal function is otherwise normal.   PMH: Past Medical History  Diagnosis Date  . Renal disorder   . NSTEMI (non-ST elevated myocardial infarction) ([place])   . Acute renal failure (ARF) ([place])   . Type 2 diabetes mellitus ([place])   . Paroxysmal atrial fibrillation [facility])     Surgical History: Past Surgical History  Procedure Laterality Date  . Cystoscopy with stent placement Right 11/03/2015    Procedure: CYSTOSCOPY WITH STENT PLACEMENT;  Surgeon: Hollice Espy, MD;  Location: [facility] ORS;  Service: Urology;  Laterality: Right;  . Tonsillectomy      Home Medications:    Medication List       This list is accurate as  of: 12/16/15  5:40 PM.  Always use your most recent med list.               acetaminophen 500 MG tablet  Commonly known as:  TYLENOL  Take 1,000 mg by mouth every 8 (eight) hours as needed for moderate pain. Reported on 12/16/2015     antiseptic oral rinse 0.05 % Liqd solution  Commonly known as:  CPC / CETYLPYRIDINIUM CHLORIDE 0.05%  7 mLs by Mouth Rinse route 2 (two) times daily.     bisacodyl 10 MG suppository  Commonly known as:  DULCOLAX  Place 1 suppository (10 mg total) rectally daily as needed for moderate constipation.     ciprofloxacin 500 MG tablet  Commonly known as:  CIPRO  Take 1 tablet (500 mg total) by mouth 2 (two) times daily.     diclofenac sodium 1 % Gel  Commonly known as:  VOLTAREN  Apply 4 g topically 4 (four) times daily.     docusate sodium 100 MG capsule  Commonly known as:  COLACE  Take 1 capsule (100 mg total) by mouth 2 (two) times daily.     insulin glargine 100 UNIT/ML injection  Commonly known as:  LANTUS  Inject 0.15 mLs (15 Units total) into the skin daily.     ipratropium-albuterol 0.5-2.5 (3) MG/3ML Soln  Commonly known as:  DUONEB  Take 3 mLs by nebulization every 6 (six) hours as needed.     metoprolol tartrate 25 MG tablet  Commonly known as:  LOPRESSOR  Take 1 tablet (25 mg total) by  mouth 2 (two) times daily.     ondansetron 4 MG tablet  Commonly known as:  ZOFRAN  Take 1 tablet (4 mg total) by mouth every 6 (six) hours as needed for nausea.     oxyCODONE 5 MG immediate release tablet  Commonly known as:  Oxy IR/ROXICODONE  Take 1 tablet (5 mg total) by mouth every 6 (six) hours as needed for moderate pain or severe pain.     polyethylene glycol packet  Commonly known as:  MIRALAX / GLYCOLAX  Take 17 g by mouth daily.     QUEtiapine 25 MG tablet  Commonly known as:  SEROQUEL  Take 1 tablet (25 mg total) by mouth at bedtime.     tamsulosin 0.4 MG Caps capsule  Commonly known as:  FLOMAX  Take 1 capsule (0.4 mg total) by  mouth daily.     traZODone 50 MG tablet  Commonly known as:  DESYREL  Take 50 mg by mouth at bedtime.        Allergies:  Allergies  Allergen Reactions  . Prednisone Anaphylaxis    Family History: Family History  Problem Relation Age of Onset  . Diabetes Father   . Stroke Maternal Grandmother   . Heart disease Father     Social History:  reports that she has never smoked. She does not have any smokeless tobacco history on file. She reports that she does not drink alcohol or use illicit drugs.  ROS: UROLOGY Frequent Urination?: No Hard to postpone urination?: No Burning/pain with urination?: No Get up at night to urinate?: No Leakage of urine?: No Urine stream starts and stops?: No Trouble starting stream?: No Do you have to strain to urinate?: No Blood in urine?: No Urinary tract infection?: No Sexually transmitted disease?: No Injury to kidneys or bladder?: No Painful intercourse?: No Weak stream?: No Currently pregnant?: No Vaginal bleeding?: No Last menstrual period?: n  Gastrointestinal Nausea?: No Vomiting?: No Indigestion/heartburn?: No Diarrhea?: No Constipation?: No  Constitutional Fever: No Night sweats?: No Weight loss?: No Fatigue?: No  Skin Skin rash/lesions?: No Itching?: No  Eyes Blurred vision?: No Double vision?: No  Ears/Nose/Throat Sore throat?: No Sinus problems?: No  Hematologic/Lymphatic Swollen glands?: No Easy bruising?: No  Cardiovascular Leg swelling?: No Chest pain?: No  Respiratory Cough?: No Shortness of breath?: No  Endocrine Excessive thirst?: No  Musculoskeletal Back pain?: No Joint pain?: No  Neurological Headaches?: No Dizziness?: No  Psychologic Depression?: No Anxiety?: No  Physical Exam: BP 108/72 mmHg  Pulse 91  Ht '5\' 7"'$  (1.702 m)  Wt 220 lb (99.791 kg)  BMI 34.45 kg/m2  Constitutional:  Alert and oriented, No acute distress.  Somewhat ill-appearing. In wheelchair. Accompanied by  family member. HEENT: [place] AT, moist mucus membranes.  Trachea midline, no masses. Cardiovascular: + Total necrosis bilaterally.  Bilateral lower extremity edema/anasarca noted. Respiratory: Normal respiratory effort, no increased work of breathing. GI: Abdomen is soft, nontender, nondistended, no abdominal masses GU: No CVA tenderness.  Skin: No rashes, bruises or suspicious lesions. Lymph: No cervical or inguinal adenopathy. Neurologic: Grossly intact, no focal deficits, moving all 4 extremities. Psychiatric: Normal mood and affect.  Laboratory Data: Lab Results  Component Value Date   WBC 16.6* 12/14/2015   HGB 9.3* 12/14/2015   HCT 28.0* 12/14/2015   MCV 90.3 12/14/2015   PLT 367 12/14/2015    Lab Results  Component Value Date   CREATININE 0.62 12/08/2015    Lab Results  Component Value Date  HGBA1C 7.7* 10/21/2015    Pertinent Imaging: Study Result     CLINICAL DATA: Sepsis.  EXAM: CT ABDOMEN AND PELVIS WITHOUT CONTRAST  TECHNIQUE: Multidetector CT imaging of the abdomen and pelvis was performed following the standard protocol without IV contrast.  COMPARISON: CT scan of October 20, 2015.  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 Mild bilateral pleural effusions are noted with adjacent atelectasis. No gallstones are noted. No focal abnormality is noted in the liver, spleen or pancreas on these unenhanced images. Nasogastric tube tip is seen in proximal stomach. Adrenal glands appear normal. Left kidney and ureter appear normal. Moderate right hydronephrosis is again noted with nonobstructive calculus seen in upper pole calyx. 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 Atherosclerosis of abdominal aorta is noted without aneurysm formation. There is no evidence of bowel obstruction. The appendix appears normal. There is no evidence of bowel obstruction. Urinary bladder is decompressed secondary  to Foley catheter. Stable left ovarian cyst is noted. Uterus and right ovary appear normal. No significant adenopathy is noted. Mild anasarca is noted.  IMPRESSION: Atherosclerosis of abdominal aorta without aneurysm formation.  Nonobstructive right renal calculus is again noted. 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  Mild anasarca is noted.  Mild bilateral pleural effusions are noted with adjacent atelectasis.   Electronically Signed  By: Marijo Conception, M.D.  On: 10/27/2015 17:09    CT scan reviewed again today personally and  with the patient.  Assessment & Plan:    1. Hydronephrosis, right CT abdomen pelvis upon initial presentation without contrast was reviewed again today. She does have mild right hydronephrosis but in reality, the quality of the study is poor and without contrast it is difficult to clearly identify the etiology of her hydronephrosis. In the setting of urosepsis, this could be related to pyelonephritis, UPJ obstruction, ureteral stricture, extrinsic compression, or intraluminal obstruction of the proximal ureter.    Stent placed in the setting of sepsis to rule out obstruction as an underlying contributing factor. Unable to perform a retrograde pyelogram due to technical difficulties at the time.  I explained to the patient that ideally, I'd like to get her stent out as this is likely concerning to her recurrent episodes of Proteus urinary tract infection.  We discussed proceeding back to the operating room at which time we could perform a diagnostic vital retrograde pyelogram to further workup the underlying etiology of her right mild hydronephrosis and possibly proceed with diagnostic intervention/ treatment as needed.  Alternatively, we could remove her stent in the office and proceed with CT urogram after the stent is removed with the understanding if pathology is  identified, she will need further workup potentially in the OR. She preferred to have the stent removed in the office followed by CT urogram.   We will try to remove her stent while she is still on her antibiotic course to reduce the risk of recurrent sepsis.\  2. Sepsis due to urinary tract infection ([place]) On Cipro for recurrent Proteus urinary tract infection following profound sepsis.  3. Right nephrolithiasis Punctate right kidney stone on CT scan, asymptomatic. No intervention recommended at this time. We'll continue to follow.   Return for first availible for cystoscopy/ stent removal.  Hollice Espy, MD  [facility] [address], [place], [place] [address] [phone]  I spent 25 min with this patient of which greater than 50% was spent in  counseling and coordination of care with the patient.

## 2015-12-17 DIAGNOSIS — E119 Type 2 diabetes mellitus without complications: Secondary | ICD-10-CM | POA: Diagnosis not present

## 2015-12-17 LAB — GLUCOSE, CAPILLARY
GLUCOSE-CAPILLARY: 101 mg/dL — AB (ref 65–99)
GLUCOSE-CAPILLARY: 217 mg/dL — AB (ref 65–99)
Glucose-Capillary: 165 mg/dL — ABNORMAL HIGH (ref 65–99)
Glucose-Capillary: 177 mg/dL — ABNORMAL HIGH (ref 65–99)
Glucose-Capillary: 176 mg/dL — ABNORMAL HIGH (ref 65–99)

## 2015-12-18 DIAGNOSIS — E119 Type 2 diabetes mellitus without complications: Secondary | ICD-10-CM | POA: Diagnosis not present

## 2015-12-18 LAB — GLUCOSE, CAPILLARY
GLUCOSE-CAPILLARY: 95 mg/dL (ref 65–99)
GLUCOSE-CAPILLARY: 117 mg/dL — AB (ref 65–99)
GLUCOSE-CAPILLARY: 135 mg/dL — AB (ref 65–99)

## 2015-12-19 LAB — GLUCOSE, CAPILLARY
GLUCOSE-CAPILLARY: 155 mg/dL — AB (ref 65–99)
GLUCOSE-CAPILLARY: 95 mg/dL (ref 65–99)
GLUCOSE-CAPILLARY: 114 mg/dL — AB (ref 65–99)
GLUCOSE-CAPILLARY: 139 mg/dL — AB (ref 65–99)
Glucose-Capillary: 141 mg/dL — ABNORMAL HIGH (ref 65–99)

## 2015-12-21 ENCOUNTER — Telehealth: Payer: Self-pay | Admitting: Urology

## 2015-12-21 DIAGNOSIS — E119 Type 2 diabetes mellitus without complications: Secondary | ICD-10-CM | POA: Diagnosis not present

## 2015-12-21 LAB — GLUCOSE, CAPILLARY
GLUCOSE-CAPILLARY: 92 mg/dL (ref 65–99)
GLUCOSE-CAPILLARY: 99 mg/dL (ref 65–99)
GLUCOSE-CAPILLARY: 96 mg/dL (ref 65–99)
GLUCOSE-CAPILLARY: 154 mg/dL — AB (ref 65–99)
Glucose-Capillary: 107 mg/dL — ABNORMAL HIGH (ref 65–99)
Glucose-Capillary: 151 mg/dL — ABNORMAL HIGH (ref 65–99)
Glucose-Capillary: 148 mg/dL — ABNORMAL HIGH (ref 65–99)

## 2015-12-21 NOTE — Telephone Encounter (Signed)
Patient called and wanted to move her cysto stent removal up and so i am trying to get it moved to this Wednesday at 10:00 her driver is calling me back in the morning to confirm that she will be able to bring her. The patient says that the stent is poking her and hurting her so bad she can't stand it. She wants pain meds. I explained that it is 4:55 and that you are not in the office and that someone would have to come and pick up a script even if you did write one. Her original appt was for 12/30/15. I told her that it would probably be tomorrow before we could get back to her about the pain meds unless you checked this and were able to call her tonight. If not i can let her know something tomorrow when i talk to her driver.   Thanks,  Sharyn Lull

## 2015-12-22 DIAGNOSIS — E119 Type 2 diabetes mellitus without complications: Secondary | ICD-10-CM | POA: Diagnosis not present

## 2015-12-22 LAB — GLUCOSE, CAPILLARY
GLUCOSE-CAPILLARY: 92 mg/dL (ref 65–99)
GLUCOSE-CAPILLARY: 183 mg/dL — AB (ref 65–99)
GLUCOSE-CAPILLARY: 109 mg/dL — AB (ref 65–99)
Glucose-Capillary: 109 mg/dL — ABNORMAL HIGH (ref 65–99)

## 2015-12-23 ENCOUNTER — Encounter: Payer: Self-pay | Admitting: Urology

## 2015-12-23 ENCOUNTER — Ambulatory Visit (INDEPENDENT_AMBULATORY_CARE_PROVIDER_SITE_OTHER): Payer: Medicare HMO | Admitting: Urology

## 2015-12-23 VITALS — BP 105/70 | HR 68 | Ht 67.0 in | Wt 218.0 lb

## 2015-12-23 DIAGNOSIS — A419 Sepsis, unspecified organism: Secondary | ICD-10-CM | POA: Diagnosis not present

## 2015-12-23 DIAGNOSIS — N135 Crossing vessel and stricture of ureter without hydronephrosis: Secondary | ICD-10-CM | POA: Diagnosis not present

## 2015-12-23 DIAGNOSIS — N390 Urinary tract infection, site not specified: Secondary | ICD-10-CM | POA: Diagnosis not present

## 2015-12-23 LAB — GLUCOSE, CAPILLARY
GLUCOSE-CAPILLARY: 126 mg/dL — AB (ref 65–99)
GLUCOSE-CAPILLARY: 148 mg/dL — AB (ref 65–99)
GLUCOSE-CAPILLARY: 103 mg/dL — AB (ref 65–99)

## 2015-12-23 MED ORDER — CEFTRIAXONE SODIUM 1 G IJ SOLR
1.0000 g | Freq: Once | INTRAMUSCULAR | Status: AC
  Administered 2015-12-23: 1 g via INTRAMUSCULAR

## 2015-12-23 NOTE — Progress Notes (Signed)
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 She was recently hospitalized for profound gram negative rod sepsis and his didn't treated with prolonged course of antibiotics which she continues today. She has elected to have her stent removed which was placed emergently at the time of severe illness.  She returned to the office today for cystoscopy, right stent removal.   Blood pressure 105/70, pulse 68, height '5\' 7"'$  (1.702 m), weight 218 lb (98.884 kg).   Cystoscopy/ Stent removal procedure  Patient identification was confirmed, informed consent was obtained, and patient was prepped using Betadine solution.  Lidocaine jelly was administered per urethral meatus.      Preoperative abx where received prior to procedure (ceftriaxone in addition to her oral antibiotics).  Procedure: - Flexible cystoscope introduced, without any difficulty.   - Thorough search of the bladder revealed:    normal urethral meatus  Stent seen emanating from right ureteral orifice, grasped with stent graspers, and removed in entirety.     Moderate debris in bladder limiting visualization.  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  Post stent removal warning signs reviewed, we will go to the emergency room in setting of fever.

## 2015-12-24 DIAGNOSIS — E119 Type 2 diabetes mellitus without complications: Secondary | ICD-10-CM | POA: Diagnosis not present

## 2015-12-24 LAB — GLUCOSE, CAPILLARY
GLUCOSE-CAPILLARY: 123 mg/dL — AB (ref 65–99)
GLUCOSE-CAPILLARY: 82 mg/dL (ref 65–99)
GLUCOSE-CAPILLARY: 155 mg/dL — AB (ref 65–99)
Glucose-Capillary: 172 mg/dL — ABNORMAL HIGH (ref 65–99)
Glucose-Capillary: 124 mg/dL — ABNORMAL HIGH (ref 65–99)

## 2015-12-25 DIAGNOSIS — E119 Type 2 diabetes mellitus without complications: Secondary | ICD-10-CM | POA: Diagnosis not present

## 2015-12-25 LAB — GLUCOSE, CAPILLARY
GLUCOSE-CAPILLARY: 155 mg/dL — AB (ref 65–99)
Glucose-Capillary: 86 mg/dL (ref 65–99)
Glucose-Capillary: 108 mg/dL — ABNORMAL HIGH (ref 65–99)
Glucose-Capillary: 114 mg/dL — ABNORMAL HIGH (ref 65–99)

## 2015-12-26 ENCOUNTER — Encounter
Admission: RE | Admit: 2015-12-26 | Discharge: 2015-12-26 | Disposition: A | Discharge status: Home / Self Care | Payer: Medicare HMO | Source: Ambulatory Visit | Attending: Internal Medicine | Admitting: Internal Medicine

## 2015-12-26 DIAGNOSIS — E119 Type 2 diabetes mellitus without complications: Secondary | ICD-10-CM | POA: Diagnosis present

## 2015-12-27 LAB — GLUCOSE, CAPILLARY
GLUCOSE-CAPILLARY: 92 mg/dL (ref 65–99)
GLUCOSE-CAPILLARY: 85 mg/dL (ref 65–99)
Glucose-Capillary: 159 mg/dL — ABNORMAL HIGH (ref 65–99)
Glucose-Capillary: 125 mg/dL — ABNORMAL HIGH (ref 65–99)
Glucose-Capillary: 118 mg/dL — ABNORMAL HIGH (ref 65–99)
Glucose-Capillary: 81 mg/dL (ref 65–99)
Glucose-Capillary: 111 mg/dL — ABNORMAL HIGH (ref 65–99)
Glucose-Capillary: 102 mg/dL — ABNORMAL HIGH (ref 65–99)

## 2015-12-28 DIAGNOSIS — E119 Type 2 diabetes mellitus without complications: Secondary | ICD-10-CM | POA: Diagnosis not present

## 2015-12-28 LAB — GLUCOSE, CAPILLARY
GLUCOSE-CAPILLARY: 125 mg/dL — AB (ref 65–99)
Glucose-Capillary: 79 mg/dL (ref 65–99)
Glucose-Capillary: 120 mg/dL — ABNORMAL HIGH (ref 65–99)
Glucose-Capillary: 127 mg/dL — ABNORMAL HIGH (ref 65–99)

## 2015-12-29 DIAGNOSIS — E119 Type 2 diabetes mellitus without complications: Secondary | ICD-10-CM | POA: Diagnosis not present

## 2015-12-29 LAB — GLUCOSE, CAPILLARY
GLUCOSE-CAPILLARY: 92 mg/dL (ref 65–99)
GLUCOSE-CAPILLARY: 118 mg/dL — AB (ref 65–99)
GLUCOSE-CAPILLARY: 98 mg/dL (ref 65–99)
Glucose-Capillary: 110 mg/dL — ABNORMAL HIGH (ref 65–99)

## 2015-12-30 ENCOUNTER — Other Ambulatory Visit: Payer: Medicare HMO | Admitting: Urology

## 2015-12-30 DIAGNOSIS — E119 Type 2 diabetes mellitus without complications: Secondary | ICD-10-CM | POA: Diagnosis not present

## 2015-12-30 LAB — GLUCOSE, CAPILLARY
GLUCOSE-CAPILLARY: 132 mg/dL — AB (ref 65–99)
GLUCOSE-CAPILLARY: 127 mg/dL — AB (ref 65–99)
Glucose-Capillary: 87 mg/dL (ref 65–99)
Glucose-Capillary: 167 mg/dL — ABNORMAL HIGH (ref 65–99)

## 2015-12-31 DIAGNOSIS — E119 Type 2 diabetes mellitus without complications: Secondary | ICD-10-CM | POA: Diagnosis not present

## 2015-12-31 LAB — GLUCOSE, CAPILLARY
GLUCOSE-CAPILLARY: 114 mg/dL — AB (ref 65–99)
GLUCOSE-CAPILLARY: 112 mg/dL — AB (ref 65–99)
Glucose-Capillary: 101 mg/dL — ABNORMAL HIGH (ref 65–99)
Glucose-Capillary: 133 mg/dL — ABNORMAL HIGH (ref 65–99)

## 2016-01-01 DIAGNOSIS — E119 Type 2 diabetes mellitus without complications: Secondary | ICD-10-CM | POA: Diagnosis not present

## 2016-01-01 LAB — GLUCOSE, CAPILLARY: GLUCOSE-CAPILLARY: 85 mg/dL (ref 65–99)

## 2016-01-02 ENCOUNTER — Emergency Department
Admission: EM | Admit: 2016-01-02 | Discharge: 2016-01-03 | Disposition: A | Discharge status: Home / Self Care | Payer: Medicare HMO | Attending: Emergency Medicine | Admitting: Emergency Medicine

## 2016-01-02 DIAGNOSIS — L97519 Non-pressure chronic ulcer of other part of right foot with unspecified severity: Secondary | ICD-10-CM | POA: Diagnosis not present

## 2016-01-02 DIAGNOSIS — N390 Urinary tract infection, site not specified: Secondary | ICD-10-CM

## 2016-01-02 DIAGNOSIS — E1152 Type 2 diabetes mellitus with diabetic peripheral angiopathy with gangrene: Secondary | ICD-10-CM | POA: Diagnosis not present

## 2016-01-02 DIAGNOSIS — I480 Paroxysmal atrial fibrillation: Secondary | ICD-10-CM | POA: Diagnosis not present

## 2016-01-02 DIAGNOSIS — J9690 Respiratory failure, unspecified, unspecified whether with hypoxia or hypercapnia: Secondary | ICD-10-CM | POA: Diagnosis not present

## 2016-01-02 DIAGNOSIS — E1129 Type 2 diabetes mellitus with other diabetic kidney complication: Secondary | ICD-10-CM | POA: Diagnosis not present

## 2016-01-02 DIAGNOSIS — Z794 Long term (current) use of insulin: Secondary | ICD-10-CM | POA: Insufficient documentation

## 2016-01-02 DIAGNOSIS — Z79899 Other long term (current) drug therapy: Secondary | ICD-10-CM | POA: Insufficient documentation

## 2016-01-02 DIAGNOSIS — D696 Thrombocytopenia, unspecified: Secondary | ICD-10-CM | POA: Insufficient documentation

## 2016-01-02 DIAGNOSIS — I96 Gangrene, not elsewhere classified: Secondary | ICD-10-CM

## 2016-01-02 DIAGNOSIS — E11621 Type 2 diabetes mellitus with foot ulcer: Secondary | ICD-10-CM | POA: Diagnosis present

## 2016-01-02 DIAGNOSIS — I252 Old myocardial infarction: Secondary | ICD-10-CM | POA: Diagnosis not present

## 2016-01-02 LAB — COMPREHENSIVE METABOLIC PANEL
ALK PHOS: 124 U/L (ref 38–126)
ALT: 11 U/L — ABNORMAL LOW (ref 14–54)
ANION GAP: 6 (ref 5–15)
AST: 18 U/L (ref 15–41)
Albumin: 3 g/dL — ABNORMAL LOW (ref 3.5–5.0)
BUN: 8 mg/dL (ref 6–20)
CALCIUM: 9 mg/dL (ref 8.9–10.3)
CO2: 25 mmol/L (ref 22–32)
Chloride: 104 mmol/L (ref 101–111)
Creatinine, Ser: 0.66 mg/dL (ref 0.44–1.00)
Glucose, Bld: 105 mg/dL — ABNORMAL HIGH (ref 65–99)
Potassium: 3.8 mmol/L (ref 3.5–5.1)
SODIUM: 135 mmol/L (ref 135–145)
Total Bilirubin: 0.2 mg/dL — ABNORMAL LOW (ref 0.3–1.2)
Total Protein: 7.3 g/dL (ref 6.5–8.1)

## 2016-01-02 LAB — CBC WITH DIFFERENTIAL/PLATELET
Basophils Absolute: 0.1 10*3/uL (ref 0–0.1)
Basophils Relative: 1 %
Eosinophils Absolute: 0.5 10*3/uL (ref 0–0.7)
HCT: 31.9 % — ABNORMAL LOW (ref 35.0–47.0)
HEMOGLOBIN: 10.6 g/dL — AB (ref 12.0–16.0)
LYMPHS ABS: 2.8 10*3/uL (ref 1.0–3.6)
Lymphocytes Relative: 15 %
MCH: 30.3 pg (ref 26.0–34.0)
MCHC: 33.2 g/dL (ref 32.0–36.0)
MCV: 91.4 fL (ref 80.0–100.0)
Monocytes Absolute: 1.6 10*3/uL — ABNORMAL HIGH (ref 0.2–0.9)
Neutro Abs: 14.2 10*3/uL — ABNORMAL HIGH (ref 1.4–6.5)
Neutrophils Relative %: 73 %
PLATELETS: 424 10*3/uL (ref 150–440)
RBC: 3.49 MIL/uL — ABNORMAL LOW (ref 3.80–5.20)
RDW: 16 % — ABNORMAL HIGH (ref 11.5–14.5)
WBC: 19.3 10*3/uL — ABNORMAL HIGH (ref 3.6–11.0)

## 2016-01-02 LAB — URINALYSIS COMPLETE WITH MICROSCOPIC (ARMC ONLY)
Bacteria, UA: NONE SEEN
Bilirubin Urine: NEGATIVE
Glucose, UA: NEGATIVE mg/dL
HGB URINE DIPSTICK: NEGATIVE
KETONES UR: NEGATIVE mg/dL
NITRITE: NEGATIVE
PROTEIN: NEGATIVE mg/dL
SPECIFIC GRAVITY, URINE: 1.006 (ref 1.005–1.030)
pH: 6 (ref 5.0–8.0)

## 2016-01-02 MED ORDER — DEXTROSE 5 % IV SOLN
2.0000 g | Freq: Once | INTRAVENOUS | Status: AC
  Administered 2016-01-02: 2 g via INTRAVENOUS
  Filled 2016-01-02: qty 2

## 2016-01-02 MED ORDER — SULFAMETHOXAZOLE-TRIMETHOPRIM 800-160 MG PO TABS: 1.0000 | ORAL_TABLET | Freq: Two times a day (BID) | ORAL | Status: DC

## 2016-01-02 NOTE — Discharge Instructions (Signed)
Urinary Tract Infection Urinary tract infections (UTIs) can develop anywhere along your urinary tract. Your urinary tract is your body's drainage system for removing wastes and extra water. Your urinary tract includes two kidneys, two ureters, a bladder, and a urethra. Your kidneys are a pair of bean-shaped organs. Each kidney is about the size of your fist. They are located below your ribs, one on each side of your spine. CAUSES Infections are caused by microbes, which are microscopic organisms, including fungi, viruses, and bacteria. These organisms are so small that they can only be seen through a microscope. Bacteria are the microbes that most commonly cause UTIs. SYMPTOMS  Symptoms of UTIs may vary by age and gender of the patient and by the location of the infection. Symptoms in young women typically include a frequent and intense urge to urinate and a painful, burning feeling in the bladder or urethra during urination. Older women and men are more likely to be tired, shaky, and weak and have muscle aches and abdominal pain. A fever may mean the infection is in your kidneys. Other symptoms of a kidney infection include pain in your back or sides below the ribs, nausea, and vomiting. DIAGNOSIS To diagnose a UTI, your caregiver will ask you about your symptoms. Your caregiver will also ask you to provide a urine sample. The urine sample will be tested for bacteria and white blood cells. White blood cells are made by your body to help fight infection. TREATMENT  Typically, UTIs can be treated with medication. Because most UTIs are caused by a bacterial infection, they usually can be treated with the use of antibiotics. The choice of antibiotic and length of treatment depend on your symptoms and the type of bacteria causing your infection. HOME CARE INSTRUCTIONS  If you were prescribed antibiotics, take them exactly as your caregiver instructs you. Finish the medication even if you feel better after  you have only taken some of the medication.  Drink enough water and fluids to keep your urine clear or pale yellow.  Avoid caffeine, tea, and carbonated beverages. They tend to irritate your bladder.  Empty your bladder often. Avoid holding urine for long periods of time.  Empty your bladder before and after sexual intercourse.  After a bowel movement, women should cleanse from front to back. Use each tissue only once. SEEK MEDICAL CARE IF:   You have back pain.  You develop a fever.  Your symptoms do not begin to resolve within 3 days. SEEK IMMEDIATE MEDICAL CARE IF:   You have severe back pain or lower abdominal pain.  You develop chills.  You have nausea or vomiting.  You have continued burning or discomfort with urination. MAKE SURE YOU:   Understand these instructions.  Will watch your condition.  Will get help right away if you are not doing well or get worse.   This information is not intended to replace advice given to you by your health care provider. Make sure you discuss any questions you have with your health care provider.   Document Released: 06/22/2005 Document Revised: 06/03/2015 Document Reviewed: 10/21/2011 Elsevier Interactive Patient Education 2016 [employer]  Gangrene Gangrene is a medical condition caused by a lack of blood supply to an area of your body. Oxygen travels through your blood, so less blood means less oxygen. Without oxygen, your body tissue will start to die.  Gangrene can affect many parts of your body. Gangrene is most common on the skin (external gangrene), but it can  also affect internal parts of the body (internal gangrene).  Gangrene requires emergency treatment. CAUSES  Any condition that cuts off blood supply can cause gangrene. Common causes include:  Injuries.  Blood vessel disease.  Diabetes.  Infections. RISK FACTORS You may be at increased risk for gangrene if you have:  A serious injury.  Recent  surgery.  Diabetes.  A weak body defense system (immune system).  Narrowing of your arteries (arteriosclerosis).  An immune system disease that causes your arteries to constrict (Raynaud disease).  A history of IV drug use. SIGNS AND SYMPTOMS The signs and symptoms depend on what part of your body is affected. If you have external gangrene, you may have:  Severe pain followed by loss of feeling.  Redness and swelling.  Crackling sounds when you press on a swollen area of skin.  A wound with bad-smelling drainage.  Changes in the color of your skin. It may turn red, blue, black, or white.  Fever and chills. If you have internal gangrene, you may have:  Fever and chills.  Confusion.  Dizziness.  Severe pain.  Rapid heartbeat and breathing.  Loss of appetite.  Nausea or vomiting. DIAGNOSIS  To diagnose gangrene, your health care provider will take your medical history and do a physical exam. Tests may also be done to help in making the diagnosis. These may include:  X-rays of your blood vessels after injection of a dye (arteriogram).  Blood tests to look for an infection in your blood.  Imaging studies such as a CT scan or MRI.  Taking a swab from a draining wound to look for bacteria in the laboratory (culture).  Taking a piece of tissue (biopsy) to look for cell death in the laboratory.  A surgical procedure to look for gangrene inside your body. TREATMENT  Treatment for gangrene depends on the cause and the area of your body that is affected. Gangrene is usually treated in the hospital. It is very important to start treatment early before gangrene spreads. Treatment may include the following:  Medicine. You may get high doses of antibiotic medicine for gangrene caused by infection. The antibiotics may be given directly into a vein through an IV tube.  Surgery. Surgical options may include:  Debridement. This is surgery to remove dead tissue. You may need  to have debridement several times.  Bypass or angioplasty. This surgery improves the blood supply to an affected area.  Amputation. Sometimes it is necessary to remove a body part.  Oxygen therapy. This involves treatment in a chamber designed to provide high levels of oxygen (hyperbaric oxygen therapy). It can improve the oxygen supply to an affected area.  Supportive care. This may include:  IV fluids and nutrients.  Pain medicines.  Blood thinners to prevent blood clots. HOME CARE INSTRUCTIONS  Take medicines only as directed by your health care provider.  If you were prescribed an antibiotic medicine, finish it all even if you start to feel better.  Clean any cuts or scratches with an antiseptic.  Cover any open wounds.  Check wounds for signs of infection. Watch for redness or swelling.  Do not use any tobacco products, including cigarettes, chewing tobacco, or electronic cigarettes. If you need help quitting, ask your health care provider.  Limit alcohol intake to no more than 1 drink per day for nonpregnant women and 2 drinks per day for men. One drink equals 12 ounces of beer, 5 ounces of wine, or 1 ounces of hard liquor.  Eat a healthy diet and maintain a healthy weight.  Get some exercise on most days of the week. Ask your health care provider to suggest activities that are safe for you.  If you have diabetes or another blood vessel disease, check your feet often for signs of injury or infection.  After any surgery you have, follow your health care provider's instructions carefully.  Keep all follow-up visits as directed by your health care provider. This is important. SEEK MEDICAL CARE IF:  You have a wound or sore that is not healing.  You have color changes (white, red, blue, or black) in an area of your skin.  You have a wound or sore with smelly drainage. SEEK IMMEDIATE MEDICAL CARE IF:   You have rapidly worsening pain at the site of a skin infection  or wound.  You lose sensation at the site of a skin infection or wound.  You have unexplained:  Fever.  Chills.  Confusion.  Fainting. MAKE SURE YOU:   Understand these instructions.  Will watch your condition.  Will get help right away if you are not doing well or get worse.   This information is not intended to replace advice given to you by your health care provider. Make sure you discuss any questions you have with your health care provider.   Document Released: 07/14/2004 Document Revised: 10/03/2014 Document Reviewed: 11/06/2013 Elsevier Interactive Patient Education [employer].

## 2016-01-02 NOTE — ED Notes (Signed)
RIGHT ARM BP:  134/73 RIGHT CALF BP:  156/63 LEFT ARM BP:  148/61 LEFT CALF BP:  174/66

## 2016-01-02 NOTE — ED Provider Notes (Signed)
[facility] Emergency Department Provider Note     Time seen: ----------------------------------------- 8:16 PM on 01/02/2016 -----------------------------------------    I have reviewed the triage vital signs and the nursing notes.   HISTORY  Chief Complaint Diabetic Ulcer    HPI April [name] is a 70 y.o. female who presents to ER for bleeding from bilateral necrotic toes. 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 She arrives in an [employer], has seen [facility] in the past. She states someone has bumped into her toes a few times and they're cracked and bleeding. They're also concerned she has a urinary tract infection. She denies any fevers, chills, chest pain, shortness of breath, nausea vomiting or diarrhea.   Past Medical History  Diagnosis Date  . Renal disorder   . NSTEMI (non-ST elevated myocardial infarction) ([place])   . Acute renal failure (ARF) ([place])   . Type 2 diabetes mellitus ([place])   . Paroxysmal atrial fibrillation [facility])     Patient Active Problem List   Diagnosis Date Noted  . UPJ (ureteropelvic junction) obstruction   . Paroxysmal atrial fibrillation ([place]) 12/04/2015  . Back pain 12/04/2015  . Thrombocytopenia ([place]) 12/03/2015  . Leukocytosis 12/03/2015  . Type 2 diabetes mellitus ([place]) 12/03/2015  . Respiratory failure ([place])   . Proteus septicemia ([place])   . Acute renal failure (ARF) ([place])   . Hydronephrosis   . Endotracheally intubated   . Respiratory distress   . Arterial hypotension   . Sepsis ([place]) 10/20/2015  . Acute renal failure ([place])   . Altered mental status   . NSTEMI (non-ST elevated myocardial infarction) ([place])   . Sepsis due to urinary tract infection [facility])     Past Surgical History  Procedure Laterality Date  . Cystoscopy with stent placement Right 11/03/2015    Procedure: CYSTOSCOPY WITH STENT PLACEMENT;  Surgeon: Hollice Espy,  MD;  Location: [facility] ORS;  Service: Urology;  Laterality: Right;  . Tonsillectomy      Allergies Prednisone  Social History Social History  Substance Use Topics  . Smoking status: Never Smoker   . Smokeless tobacco: None  . Alcohol Use: No    Review of Systems Constitutional: Negative for fever. Eyes: Negative for visual changes. ENT: Negative for sore throat. Cardiovascular: Negative for chest pain. Respiratory: Negative for shortness of breath. Gastrointestinal: Negative for abdominal pain, vomiting and diarrhea. Genitourinary: Negative for dysuria. Musculoskeletal: Positive for toe pain bilaterally Skin: Negative for rash. Neurological: Negative for headaches, focal weakness or numbness.  10-point ROS otherwise negative.  ____________________________________________   PHYSICAL EXAM:  VITAL SIGNS: ED Triage Vitals  Enc Vitals Group     BP 01/02/16 2011 148/61 mmHg     Pulse Rate 01/02/16 2011 69     Resp 01/02/16 2011 20     Temp 01/02/16 2011 98.2 F (36.8 C)     Temp Source 01/02/16 2011 Oral     SpO2 01/02/16 2011 94 %     Weight 01/02/16 2011 200 lb (90.719 kg)     Height 01/02/16 2011 '5\' 7"'$  (1.702 m)     Head Cir --      Peak Flow --      Pain Score 01/02/16 2012 8     Pain Loc --      Pain Edu? --      Excl. in [place]? --     Constitutional: Alert and oriented. Well appearing and  in no distress. Eyes: Conjunctivae are normal. PERRL. Normal extraocular movements. ENT   Head: Normocephalic and atraumatic.   Nose: No congestion/rhinnorhea.   Mouth/Throat: Mucous membranes are moist.   Neck: No stridor. Cardiovascular: Normal rate, regular rhythm. Normal pulses in the right foot, measured pulses in the left. No murmurs, rubs, or gallops. Respiratory: Normal respiratory effort without tachypnea nor retractions. Breath sounds are clear and equal bilaterally. No wheezes/rales/rhonchi. Gastrointestinal: Soft and nontender. No distention. No  abdominal bruits.  Musculoskeletal: All of her toes on both feet are necrotic-appearing resembling dry gangrene, there is some skin cracking more proximal to the areas of necrosis. No bleeding is noted. Neurologic:  Normal speech and language. No gross focal neurologic deficits are appreciated.  Skin:  Bilateral black and necrotic appearing toes. Psychiatric: Mood and affect are normal. ____________________________________________  ED COURSE:  Pertinent labs & imaging results that were available during my care of the patient were reviewed by me and considered in my medical decision making (see chart for details). Patient with chronic ischemic changes to all of her toes. I do not appreciate any signs of acute infection. I will check basic labs and urinalysis. ____________________________________________    LABS (pertinent positives/negatives)  Labs Reviewed  CBC WITH DIFFERENTIAL/PLATELET - Abnormal; Notable for the following:    WBC 19.3 (*)    RBC 3.49 (*)    Hemoglobin 10.6 (*)    HCT 31.9 (*)    RDW 16.0 (*)    Neutro Abs 14.2 (*)    Monocytes Absolute 1.6 (*)    All other components within normal limits  COMPREHENSIVE METABOLIC PANEL - Abnormal; Notable for the following:    Glucose, Bld 105 (*)    Albumin 3.0 (*)    ALT 11 (*)    Total Bilirubin 0.2 (*)    All other components within normal limits  URINALYSIS COMPLETEWITH MICROSCOPIC ([facility] ONLY) - Abnormal; Notable for the following:    Color, Urine STRAW (*)    APPearance CLEAR (*)    Leukocytes, UA 1+ (*)    Squamous Epithelial / LPF 0-5 (*)    All other components within normal limits   ____________________________________________  FINAL ASSESSMENT AND PLAN  Dry gangrene, UTI, chronic leukocytosis  Plan: Patient with labs and imaging as dictated above. Patient was given 2 g of IV Rocephin to cover for previous Proteus infections and possible UTI today. She does have what appears to be a chronic leukocytosis,  likely from dry gangrene in her feet. I do not appreciate any soft tissue infection, she will be prescribed Septra to take at home to cover for skin infection and to continue treating her urine. She is stable for discharge. ABIs are surprisingly normal.   Earleen Newport, MD   Earleen Newport, MD 01/02/16 2202

## 2016-01-02 NOTE — ED Notes (Addendum)
Pt arrives to ED via [facility] from [facility] for c/o bilateral bleeding from necrotic toes. Pt also with c/o chronic LEFT hip pain. Pt arrives with feet and ankles wrapped with toes exposed (pt's toes are black and necrotic). Pt is A&O, in NAD, with respirations even, regular, and unlabored. EMS reports facility gave 1g of Tylenol to pt prior to leaving faclity.

## 2016-01-03 LAB — GLUCOSE, CAPILLARY
GLUCOSE-CAPILLARY: 103 mg/dL — AB (ref 65–99)
GLUCOSE-CAPILLARY: 112 mg/dL — AB (ref 65–99)
GLUCOSE-CAPILLARY: 86 mg/dL (ref 65–99)
GLUCOSE-CAPILLARY: 107 mg/dL — AB (ref 65–99)
Glucose-Capillary: 127 mg/dL — ABNORMAL HIGH (ref 65–99)
Glucose-Capillary: 81 mg/dL (ref 65–99)
Glucose-Capillary: 111 mg/dL — ABNORMAL HIGH (ref 65–99)
Glucose-Capillary: 98 mg/dL (ref 65–99)
Glucose-Capillary: 101 mg/dL — ABNORMAL HIGH (ref 65–99)

## 2016-01-04 DIAGNOSIS — E119 Type 2 diabetes mellitus without complications: Secondary | ICD-10-CM | POA: Diagnosis not present

## 2016-01-04 LAB — GLUCOSE, CAPILLARY
Glucose-Capillary: 93 mg/dL (ref 65–99)
Glucose-Capillary: 119 mg/dL — ABNORMAL HIGH (ref 65–99)

## 2016-01-05 ENCOUNTER — Ambulatory Visit
Admission: RE | Admit: 2016-01-05 | Discharge: 2016-01-05 | Disposition: A | Discharge status: Home / Self Care | Payer: Medicare HMO | Source: Ambulatory Visit | Attending: Urology | Admitting: Urology

## 2016-01-05 DIAGNOSIS — N135 Crossing vessel and stricture of ureter without hydronephrosis: Secondary | ICD-10-CM | POA: Insufficient documentation

## 2016-01-05 DIAGNOSIS — K5730 Diverticulosis of large intestine without perforation or abscess without bleeding: Secondary | ICD-10-CM | POA: Insufficient documentation

## 2016-01-05 DIAGNOSIS — I2510 Atherosclerotic heart disease of native coronary artery without angina pectoris: Secondary | ICD-10-CM | POA: Insufficient documentation

## 2016-01-05 DIAGNOSIS — E119 Type 2 diabetes mellitus without complications: Secondary | ICD-10-CM | POA: Diagnosis not present

## 2016-01-05 HISTORY — DX: Essential (primary) hypertension: I10

## 2016-01-05 LAB — GLUCOSE, CAPILLARY
GLUCOSE-CAPILLARY: 151 mg/dL — AB (ref 65–99)
Glucose-Capillary: 117 mg/dL — ABNORMAL HIGH (ref 65–99)
Glucose-Capillary: 119 mg/dL — ABNORMAL HIGH (ref 65–99)
Glucose-Capillary: 94 mg/dL (ref 65–99)

## 2016-01-05 MED ORDER — IOPAMIDOL (ISOVUE-300) INJECTION 61%
125.0000 mL | Freq: Once | INTRAVENOUS | Status: AC | PRN
  Administered 2016-01-05: 125 mL via INTRAVENOUS

## 2016-01-06 DIAGNOSIS — E119 Type 2 diabetes mellitus without complications: Secondary | ICD-10-CM | POA: Diagnosis not present

## 2016-01-06 LAB — URINE CULTURE: Special Requests: NORMAL

## 2016-01-06 LAB — GLUCOSE, CAPILLARY
GLUCOSE-CAPILLARY: 93 mg/dL (ref 65–99)
GLUCOSE-CAPILLARY: 107 mg/dL — AB (ref 65–99)
GLUCOSE-CAPILLARY: 132 mg/dL — AB (ref 65–99)
Glucose-Capillary: 106 mg/dL — ABNORMAL HIGH (ref 65–99)

## 2016-01-07 DIAGNOSIS — E119 Type 2 diabetes mellitus without complications: Secondary | ICD-10-CM | POA: Diagnosis not present

## 2016-01-07 LAB — GLUCOSE, CAPILLARY
Glucose-Capillary: 103 mg/dL — ABNORMAL HIGH (ref 65–99)
Glucose-Capillary: 152 mg/dL — ABNORMAL HIGH (ref 65–99)
Glucose-Capillary: 121 mg/dL — ABNORMAL HIGH (ref 65–99)
Glucose-Capillary: 137 mg/dL — ABNORMAL HIGH (ref 65–99)

## 2016-01-08 DIAGNOSIS — E119 Type 2 diabetes mellitus without complications: Secondary | ICD-10-CM | POA: Diagnosis not present

## 2016-01-08 LAB — GLUCOSE, CAPILLARY
GLUCOSE-CAPILLARY: 116 mg/dL — AB (ref 65–99)
GLUCOSE-CAPILLARY: 120 mg/dL — AB (ref 65–99)
Glucose-Capillary: 123 mg/dL — ABNORMAL HIGH (ref 65–99)
Glucose-Capillary: 111 mg/dL — ABNORMAL HIGH (ref 65–99)

## 2016-01-15 ENCOUNTER — Ambulatory Visit (INDEPENDENT_AMBULATORY_CARE_PROVIDER_SITE_OTHER): Payer: Medicare HMO | Admitting: Urology

## 2016-01-15 ENCOUNTER — Encounter: Payer: Self-pay | Admitting: Urology

## 2016-01-15 VITALS — BP 98/63 | HR 72 | Ht 67.0 in | Wt 210.0 lb

## 2016-01-15 DIAGNOSIS — N1330 Unspecified hydronephrosis: Secondary | ICD-10-CM | POA: Diagnosis not present

## 2016-01-15 DIAGNOSIS — N200 Calculus of kidney: Secondary | ICD-10-CM | POA: Diagnosis not present

## 2016-01-15 DIAGNOSIS — N390 Urinary tract infection, site not specified: Secondary | ICD-10-CM

## 2016-01-15 DIAGNOSIS — A419 Sepsis, unspecified organism: Secondary | ICD-10-CM

## 2016-01-15 DIAGNOSIS — N83202 Unspecified ovarian cyst, left side: Secondary | ICD-10-CM

## 2016-01-15 NOTE — Progress Notes (Signed)
5:08 PM  01/15/2016   Nira Retort 03/26/1946 [id number]  Referring provider: Albina Billet, MD [address]   [place], [place] [address]  Chief Complaint  Patient presents with  . Results    HPI: 70 year old female with complex history recently found down admitted for severe urosepsis (Proteus) in the ICU on pressors. 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  Note, stent placement was extremely technically difficult due to her habitus and severe anasarca and retrograde pyelogram pyelogram unable to be performed.  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 She was ultimately discharged on a prolonged course of ciprofloxacin.  Upon further questioning today, patient reports that she had no history of right flank pain previously. She is never been told that her right kidney was swollen. Her renal function is otherwise normal.  We discussed further options and she elected to have her stent removed. This was done successfully in the office on 12/23/2015.  He was seen in the emergency room on 12/02/2015 found to have another urinary tract infection (MRSA) which was treated with Cipro although it appears that this was resistent. She finished this course yesterday.  Suspect this was a contaminant based on relatively benign appearing associated UA.    Today, she denies any further flank pain. She has no urinary complaints.  She is unable to void today in the office to provide a sample.  Follow-up CT urogram performed approximately 2 weeks after the stent was removed to show a tiny 4 mm filling defect in the right upper pole collecting system likely representing clot or debris from this stent. She also has other incidental  findings including an ovarian cyst and an L2 endplate fracture which is new. She is asymptomatic from this.   PMH: Past Medical History  Diagnosis Date  . Renal disorder   . NSTEMI (non-ST elevated myocardial infarction) ([facility])   . Acute renal failure (ARF) ([facility])   . Type 2 diabetes mellitus ([facility])   . Paroxysmal atrial fibrillation ([facility])   . Hypertension     Surgical History: Past Surgical History  Procedure Laterality Date  . Cystoscopy with stent placement Right 11/03/2015    Procedure: CYSTOSCOPY WITH STENT PLACEMENT;  Surgeon: Hollice Espy, MD;  Location: [facility] ORS;  Service: Urology;  Laterality: Right;  . Tonsillectomy      Home Medications:    Medication List       This list is accurate as of: 01/15/16  5:08 PM.  Always use your most recent med list.               acetaminophen 500 MG tablet  Commonly known as:  TYLENOL  Take 1,000 mg by mouth every 8 (eight) hours as needed for moderate pain. Reported on 12/16/2015     antiseptic oral rinse 0.05 % Liqd solution  Commonly known as:  CPC / CETYLPYRIDINIUM CHLORIDE 0.05%  7 mLs by Mouth Rinse route 2 (two) times daily.     bisacodyl 10 MG suppository  Commonly known as:  DULCOLAX  Place 1 suppository (10 mg total) rectally daily as needed for moderate constipation.     diclofenac sodium 1 % Gel  Commonly known as:  VOLTAREN  Apply 4 g topically 4 (four) times daily.     docusate  sodium 100 MG capsule  Commonly known as:  COLACE  Take 1 capsule (100 mg total) by mouth 2 (two) times daily.     insulin glargine 100 UNIT/ML injection  Commonly known as:  LANTUS  Inject 0.15 mLs (15 Units total) into the skin daily.     ipratropium-albuterol 0.5-2.5 (3) MG/3ML Soln  Commonly known as:  DUONEB  Take 3 mLs by nebulization every 6 (six) hours as needed.     metoprolol tartrate 25 MG tablet  Commonly known as:  LOPRESSOR  Take 1 tablet (25 mg total) by mouth 2 (two) times daily.     ondansetron 4 MG tablet    Commonly known as:  ZOFRAN  Take 1 tablet (4 mg total) by mouth every 6 (six) hours as needed for nausea.     oxyCODONE 5 MG immediate release tablet  Commonly known as:  Oxy IR/ROXICODONE  Take 1 tablet (5 mg total) by mouth every 6 (six) hours as needed for moderate pain or severe pain.     polyethylene glycol packet  Commonly known as:  MIRALAX / GLYCOLAX  Take 17 g by mouth daily.     tamsulosin 0.4 MG Caps capsule  Commonly known as:  FLOMAX  Take 1 capsule (0.4 mg total) by mouth daily.     traZODone 50 MG tablet  Commonly known as:  DESYREL  Take 50 mg by mouth at bedtime.        Allergies:  Allergies  Allergen Reactions  . Prednisone Anaphylaxis    Family History: Family History  Problem Relation Age of Onset  . Diabetes Father   . Stroke Maternal Grandmother   . Heart disease Father     Social History:  reports that she has never smoked. She does not have any smokeless tobacco history on file. She reports that she does not drink alcohol or use illicit drugs.  ROS: UROLOGY Frequent Urination?: No Hard to postpone urination?: No Burning/pain with urination?: No Get up at night to urinate?: No Leakage of urine?: No Urine stream starts and stops?: No Trouble starting stream?: No Do you have to strain to urinate?: No Blood in urine?: No Urinary tract infection?: No Sexually transmitted disease?: No Injury to kidneys or bladder?: No Painful intercourse?: No Weak stream?: No Currently pregnant?: No Vaginal bleeding?: No Last menstrual period?: n  Gastrointestinal Nausea?: No Vomiting?: No Indigestion/heartburn?: No Diarrhea?: No Constipation?: No  Constitutional Fever: No Night sweats?: No Weight loss?: No Fatigue?: No  Skin Skin rash/lesions?: No Itching?: No  Eyes Blurred vision?: No Double vision?: No  Ears/Nose/Throat Sore throat?: No Sinus problems?: No  Hematologic/Lymphatic Swollen glands?: No Easy bruising?:  No  Cardiovascular Leg swelling?: No Chest pain?: No  Respiratory Cough?: No Shortness of breath?: No  Endocrine Excessive thirst?: No  Musculoskeletal Back pain?: No Joint pain?: No  Neurological Headaches?: No Dizziness?: No  Psychologic Depression?: No Anxiety?: No  Physical Exam: BP 98/63 mmHg  Pulse 72  Ht '5\' 7"'$  (1.702 m)  Wt 210 lb (95.255 kg)  BMI 32.88 kg/m2  Constitutional:  Alert and oriented, No acute distress.  In wheelchair. Accompanied by family member.  Overall well-being seems to be improved since last visit. HEENT: [place] AT, moist mucus membranes.  Trachea midline, no masses. Cardiovascular: Respiratory: Normal respiratory effort, no increased work of breathing. GI: Abdomen is soft, nontender, nondistended, no abdominal masses GU: No CVA tenderness.  Skin: No rashes, bruises or suspicious lesions. Neurologic: Grossly intact, no focal deficits, moving  all 4 extremities. Psychiatric: Normal mood and affect.  Laboratory Data: Lab Results  Component Value Date   WBC 19.3* 01/02/2016   HGB 10.6* 01/02/2016   HCT 31.9* 01/02/2016   MCV 91.4 01/02/2016   PLT 424 01/02/2016    Lab Results  Component Value Date   CREATININE 0.66 01/02/2016    Lab Results  Component Value Date   HGBA1C 7.7* 10/21/2015    Pertinent Imaging:  Addendum     Etheleen Mayhew, MD  Tue Jan 05, 2016 4:39:52 PM EDT     ADDENDUM REPORT: 01/05/2016 16:37 ADDENDUM: There are 2 findings on the study which were not described in the original dictation. They are as follows: 1 - There is a tiny 4 mm filling defect in the upper pole collecting system of the right kidney (image 23 of series 9). This may simply represent a small blood clot, or some debris, however, the possibility of a small urothelial neoplasm is not excluded. Consideration for cystoscopy and urine cytology is recommended. Additionally, close attention on followup studies is recommended to ensure the  resolution of this finding. 2 - New compression fracture of superior endplate of L2 with approximately 10-15 percent loss of anterior vertebral body height. These results will be called to the ordering clinician or representative by the Radiologist Assistant, and communication documented in the PACS or zVision Dashboard. Electronically Signed  By: Vinnie Langton M.D.  On: 01/05/2016 16:37     Study Result     CLINICAL DATA: 70 year old female with persistent left-sided flank pain. Recently hospitalized for sepsis, treated with ongoing course of antibiotics.  EXAM: CT ABDOMEN AND PELVIS WITHOUT AND WITH CONTRAST  TECHNIQUE: Multidetector CT imaging of the abdomen and pelvis was performed following the standard protocol before and following the bolus administration of intravenous contrast.  CONTRAST: 123m ISOVUE-300 IOPAMIDOL (ISOVUE-300) INJECTION 61%  COMPARISON: CT the abdomen and pelvis 10/27/2015.  FINDINGS: Lower chest: Atherosclerotic calcifications in the right coronary artery. Calcifications of the mitral annulus. Areas of linear scarring and/or subsegmental atelectasis in the dependent portions of the lung bases bilaterally. Trace bilateral pleural effusions.  Hepatobiliary: No definite cystic or solid hepatic lesions are identified on today's noncontrast CT examination. There is a small amount of dependent intermediate attenuation material lying in the gallbladder, potentially biliary sludge. No findings to suggest an acute cholecystitis at this time.  Pancreas: No definite pancreatic mass or peripancreatic inflammatory changes on today's noncontrast CT examination.  Spleen: Unremarkable.  Adrenals/Urinary Tract: Two tiny 2-3 mm nonobstructive calculi are present within the lower pole collecting system of the right kidney. No additional calculi are identified within the left renal collecting system, along the course of either ureter, or  within the lumen of the urinary bladder. No hydroureter. Mild fullness of the right renal collecting system is noted, without frank hydronephrosis. Tiny amount of gas in the urinary bladder non dependently. Urinary bladder is otherwise normal in appearance.  Stomach/Bowel: Normal appearance of the stomach. No pathologic dilatation of small bowel or colon. Colonic diverticulosis without surrounding inflammatory changes to suggest an acute diverticulitis at this time. Normal appendix.  Vascular/Lymphatic: Atherosclerosis throughout the abdominal and pelvic vasculature, without evidence of aneurysm or dissection. No lymphadenopathy noted in the abdomen or pelvis.  Reproductive: Uterus and right ovary is unremarkable in appearance. In the left adnexa there is a well-circumscribed 3.7 x 4.6 x 3.8 cm low-attenuation lesion favored to represent an ovarian cyst.  Other: No significant volume of ascites. No pneumoperitoneum.  Musculoskeletal:  There are no aggressive appearing lytic or blastic lesions noted in the visualized portions of the skeleton.  IMPRESSION: 1. No findings to account for the patient's history of left-sided flank pain. 2. Although the right hydronephrosis has resolved there continues to be a small amount of fullness in the right renal collecting system. Notably, the right renal pelvis is not dilated, suggesting against a UPJ obstruction. 3. Colonic diverticulosis without evidence of acute diverticulitis at this time. 4. Small amount of gas non dependently in the lumen of the urinary bladder, presumably iatrogenic. Clinical correlation is recommended to ensure recent instrumentation or catheterization for urinalysis. In the absence of such history, further evaluation with urinalysis would be recommended to exclude the possibility of urinary tract infection with gas-forming organisms. 5. 3.7 x 4.6 x 3.8 cm well-circumscribed left ovarian cystic lesion appears  slightly larger than prior examinations. Further evaluation with pelvic ultrasound is recommended at this time. This recommendation follows ACR consensus guidelines: White Paper of the ACR Incidental Findings Committee II on Adnexal Findings. J Am Coll Radiol 2013:10:675-681. 6. Atherosclerosis, including right coronary artery disease. Please note that although the presence of coronary artery calcium documents the presence of coronary artery disease, the severity of this disease and any potential stenosis cannot be assessed on this non-gated CT examination. Assessment for potential risk factor modification, dietary therapy or pharmacologic therapy may be warranted, if clinically indicated. 7. Additional incidental findings, as above.  Electronically Signed: By: Vinnie Langton M.D. On: 01/05/2016 14:24             CT scan reviewed again today personally and  with the patient.  Assessment & Plan:    1. Hydronephrosis, right Resolution of right hydronephrosis with residual fullness. Not suggestive of UPJ obstruction. Incidental 4 mm filling defect likely debris from stent. I have recommended follow-up for this either in the form of cystoscopy bilateral retrogrades were repeat CT urogram in 3 months. She would like to repeat this CT in 3 months. We did discuss that she may be going to the operating room for toe excision. If this is the case, may be able to coordinate bilateral retrogrades while she is under anesthesia. She will let us know she'll be going to the operating room anytime soon.  2. Sepsis due to urinary tract infection ([place]) Most recent urine culture with MRSA but UA not particularly suspicious.  This is likely contaminant. Asymptomatic today.  3. Right nephrolithiasis Punctate right kidney stone on CT scan, asymptomatic. No intervention recommended at this time. We'll continue to follow.  4. Ovarian cyst We will reevaluate on follow-up CT scan. If persistent, we  will order a pelvic ultrasound. Patient made aware findings.   Return in about 3 months (around 04/15/2016) for f/u repeat CT Urogram (delay only).  Hollice Espy, MD  [facility] [address], [place], [place] [address] [phone]  I spent 25 min with this patient of which greater than 50% was spent in counseling and coordination of care with the patient.

## 2016-01-20 ENCOUNTER — Other Ambulatory Visit: Payer: Medicare HMO | Admitting: Urology

## 2016-02-23 ENCOUNTER — Other Ambulatory Visit: Payer: Self-pay | Admitting: Vascular Surgery

## 2016-02-24 ENCOUNTER — Inpatient Hospital Stay: Admission: RE | Admit: 2016-02-24 | Payer: Medicare HMO | Source: Ambulatory Visit

## 2016-02-25 ENCOUNTER — Encounter
Admission: RE | Admit: 2016-02-25 | Discharge: 2016-02-25 | Disposition: A | Discharge status: Home / Self Care | Payer: Medicare HMO | Source: Ambulatory Visit | Attending: Vascular Surgery | Admitting: Vascular Surgery

## 2016-02-25 DIAGNOSIS — Z01810 Encounter for preprocedural cardiovascular examination: Secondary | ICD-10-CM | POA: Diagnosis present

## 2016-02-25 DIAGNOSIS — Z01812 Encounter for preprocedural laboratory examination: Secondary | ICD-10-CM | POA: Diagnosis present

## 2016-02-25 DIAGNOSIS — L97229 Non-pressure chronic ulcer of left calf with unspecified severity: Secondary | ICD-10-CM | POA: Insufficient documentation

## 2016-02-25 DIAGNOSIS — L97219 Non-pressure chronic ulcer of right calf with unspecified severity: Secondary | ICD-10-CM | POA: Insufficient documentation

## 2016-02-25 HISTORY — DX: Sepsis, unspecified organism: A41.9

## 2016-02-25 LAB — CBC WITH DIFFERENTIAL/PLATELET
BASOS ABS: 0.1 10*3/uL (ref 0–0.1)
Basophils Relative: 0 %
EOS ABS: 0.1 10*3/uL (ref 0–0.7)
Eosinophils Relative: 1 %
HCT: 40.4 % (ref 35.0–47.0)
HEMOGLOBIN: 13.5 g/dL (ref 12.0–16.0)
LYMPHS ABS: 1.8 10*3/uL (ref 1.0–3.6)
MCH: 29.4 pg (ref 26.0–34.0)
MCHC: 33.4 g/dL (ref 32.0–36.0)
MCV: 88.2 fL (ref 80.0–100.0)
Monocytes Absolute: 1.1 10*3/uL — ABNORMAL HIGH (ref 0.2–0.9)
Monocytes Relative: 8 %
Neutro Abs: 11 10*3/uL — ABNORMAL HIGH (ref 1.4–6.5)
Platelets: 342 10*3/uL (ref 150–440)
RBC: 4.59 MIL/uL (ref 3.80–5.20)
RDW: 15.8 % — ABNORMAL HIGH (ref 11.5–14.5)
WBC: 14.2 10*3/uL — AB (ref 3.6–11.0)

## 2016-02-25 LAB — BASIC METABOLIC PANEL
Anion gap: 8 (ref 5–15)
BUN: 15 mg/dL (ref 6–20)
CHLORIDE: 102 mmol/L (ref 101–111)
CO2: 27 mmol/L (ref 22–32)
Calcium: 9.6 mg/dL (ref 8.9–10.3)
Creatinine, Ser: 0.65 mg/dL (ref 0.44–1.00)
Glucose, Bld: 119 mg/dL — ABNORMAL HIGH (ref 65–99)
POTASSIUM: 4 mmol/L (ref 3.5–5.1)
SODIUM: 137 mmol/L (ref 135–145)

## 2016-02-25 LAB — TYPE AND SCREEN
ABO/RH(D): A POS
ANTIBODY SCREEN: NEGATIVE
Extend sample reason: TRANSFUSED

## 2016-02-25 LAB — PROTIME-INR
INR: 1.04
PROTHROMBIN TIME: 13.8 s (ref 11.4–15.0)

## 2016-02-25 LAB — APTT: APTT: 30 s (ref 24–36)

## 2016-02-25 LAB — SURGICAL PCR SCREEN
MRSA, PCR: NEGATIVE
STAPHYLOCOCCUS AUREUS: NEGATIVE

## 2016-02-25 NOTE — Patient Instructions (Signed)
  Your procedure is scheduled on: March 03, 2016 (Thursday) Report to Same Day Surgery 2nd floor [facility] To find out your arrival time please call [phone] between 1PM - 3PM on March 02, 2016 (Wednesday)  Remember: Instructions that are not followed completely may result in serious medical risk, up to and including death, or upon the discretion of your surgeon and anesthesiologist your surgery may need to be rescheduled.    _x___ 1. Do not eat food or drink liquids after midnight. No gum chewing or hard candies.     ___ 2. No Alcohol for 24 hours before or after surgery.   ____ 3. Bring all medications with you on the day of surgery if instructed.    __x__ 4. Notify your doctor if there is any change in your medical condition     (cold, fever, infections).     Do not wear jewelry, make-up, hairpins, clips or nail polish.  Do not wear lotions, powders, or perfumes. You may wear deodorant.  Do not shave 48 hours prior to surgery. Men may shave face and neck.  Do not bring valuables to the hospital.    [facility] is not responsible for any belongings or valuables.               Contacts, dentures or bridgework may not be worn into surgery.  Leave your suitcase in the car. After surgery it may be brought to your room.  For patients admitted to the hospital, discharge time is determined by your treatment team.   Patients discharged the day of surgery will not be allowed to drive home.    Please read over the following fact sheets that you were given:   [facility] Preparing for Surgery and or MRSA Information   _x___ Take these medicines the morning of surgery with A SIP OF WATER:    1. Metoprolol  2.  3.  4.  5.  6.  ____ Fleet Enema (as directed)   _x___ Use CHG Soap or sage wipes as directed on instruction sheet   ____ Use inhalers on the day of surgery and bring to hospital day of surgery  ____ Stop metformin 2 days prior to surgery    __x_ Take 1/2 of usual  insulin dose the night before surgery and none on the morning of surgery (NO INSULIN THE MORNING OF SURGERY)          __x__ Stop aspirin or coumadin, or plavix (NO ASPIRIN)  _x__ Stop Anti-inflammatories such as Advil, Aleve, Ibuprofen, Motrin, Naproxen,          Naprosyn, Goodies powders or aspirin products. Ok to take Tylenol.   ____ Stop supplements until after surgery.    ____ Bring C-Pap to the hospital.

## 2016-03-03 ENCOUNTER — Ambulatory Visit: Payer: Medicare HMO | Admitting: Anesthesiology

## 2016-03-03 ENCOUNTER — Encounter
Admission: RE | Disposition: A | Discharge status: Skilled Nursing Facility | Payer: Self-pay | Source: Ambulatory Visit | Attending: Vascular Surgery

## 2016-03-03 ENCOUNTER — Observation Stay
Admission: RE | Admit: 2016-03-03 | Discharge: 2016-03-05 | Disposition: A | Discharge status: Skilled Nursing Facility | Payer: Medicare HMO | Source: Ambulatory Visit | Attending: Vascular Surgery | Admitting: Vascular Surgery

## 2016-03-03 ENCOUNTER — Encounter: Payer: Self-pay | Admitting: Anesthesiology

## 2016-03-03 DIAGNOSIS — I10 Essential (primary) hypertension: Secondary | ICD-10-CM | POA: Diagnosis not present

## 2016-03-03 DIAGNOSIS — I252 Old myocardial infarction: Secondary | ICD-10-CM | POA: Insufficient documentation

## 2016-03-03 DIAGNOSIS — E1169 Type 2 diabetes mellitus with other specified complication: Secondary | ICD-10-CM | POA: Diagnosis not present

## 2016-03-03 DIAGNOSIS — M869 Osteomyelitis, unspecified: Secondary | ICD-10-CM | POA: Diagnosis not present

## 2016-03-03 DIAGNOSIS — Z794 Long term (current) use of insulin: Secondary | ICD-10-CM | POA: Diagnosis not present

## 2016-03-03 DIAGNOSIS — E11621 Type 2 diabetes mellitus with foot ulcer: Secondary | ICD-10-CM | POA: Diagnosis not present

## 2016-03-03 DIAGNOSIS — E1152 Type 2 diabetes mellitus with diabetic peripheral angiopathy with gangrene: Principal | ICD-10-CM | POA: Insufficient documentation

## 2016-03-03 DIAGNOSIS — Z79899 Other long term (current) drug therapy: Secondary | ICD-10-CM | POA: Diagnosis not present

## 2016-03-03 DIAGNOSIS — I96 Gangrene, not elsewhere classified: Secondary | ICD-10-CM | POA: Diagnosis present

## 2016-03-03 DIAGNOSIS — L97529 Non-pressure chronic ulcer of other part of left foot with unspecified severity: Secondary | ICD-10-CM | POA: Diagnosis not present

## 2016-03-03 DIAGNOSIS — I480 Paroxysmal atrial fibrillation: Secondary | ICD-10-CM | POA: Insufficient documentation

## 2016-03-03 DIAGNOSIS — Z833 Family history of diabetes mellitus: Secondary | ICD-10-CM | POA: Diagnosis not present

## 2016-03-03 HISTORY — PX: TRANSMETATARSAL AMPUTATION: SHX6197

## 2016-03-03 LAB — CREATININE, SERUM
CREATININE: 0.75 mg/dL (ref 0.44–1.00)
GFR calc non Af Amer: >60 mL/min (ref >60)

## 2016-03-03 LAB — GLUCOSE, CAPILLARY
GLUCOSE-CAPILLARY: 129 mg/dL — AB (ref 65–99)
GLUCOSE-CAPILLARY: 120 mg/dL — AB (ref 65–99)
GLUCOSE-CAPILLARY: 119 mg/dL — AB (ref 65–99)
GLUCOSE-CAPILLARY: 129 mg/dL — AB (ref 65–99)

## 2016-03-03 LAB — CBC
HCT: 36.8 % (ref 35.0–47.0)
HEMOGLOBIN: 12.1 g/dL (ref 12.0–16.0)
MCH: 28.8 pg (ref 26.0–34.0)
MCHC: 33 g/dL (ref 32.0–36.0)
MCV: 87.3 fL (ref 80.0–100.0)
PLATELETS: 343 10*3/uL (ref 150–440)
RBC: 4.21 MIL/uL (ref 3.80–5.20)
RDW: 15.7 % — ABNORMAL HIGH (ref 11.5–14.5)
WBC: 17.8 10*3/uL — AB (ref 3.6–11.0)

## 2016-03-03 SURGERY — AMPUTATION, FOOT, TRANSMETATARSAL
Anesthesia: General | Laterality: Bilateral | Wound class: Dirty or Infected

## 2016-03-03 MED ORDER — LIDOCAINE HCL (CARDIAC) 20 MG/ML IV SOLN
INTRAVENOUS | Status: DC | PRN
  Administered 2016-03-03: 100 mg via INTRAVENOUS

## 2016-03-03 MED ORDER — FAMOTIDINE 20 MG PO TABS
ORAL_TABLET | ORAL | Status: AC
  Administered 2016-03-03: 20 mg via ORAL
  Filled 2016-03-03: qty 1

## 2016-03-03 MED ORDER — HYDROMORPHONE HCL 1 MG/ML IJ SOLN
INTRAMUSCULAR | Status: AC
  Administered 2016-03-03: 0.5 mg via INTRAVENOUS
  Filled 2016-03-03: qty 1

## 2016-03-03 MED ORDER — HYDROCODONE-ACETAMINOPHEN 5-325 MG PO TABS
1.0000 | ORAL_TABLET | ORAL | Status: DC | PRN
  Administered 2016-03-03: 2 via ORAL
  Administered 2016-03-04: 1 via ORAL
  Administered 2016-03-04 – 2016-03-05 (×5): 2 via ORAL
  Filled 2016-03-03 (×7): qty 2

## 2016-03-03 MED ORDER — GLYCOPYRROLATE 0.2 MG/ML IJ SOLN
INTRAMUSCULAR | Status: DC | PRN
  Administered 2016-03-03: 0.2 mg via INTRAVENOUS

## 2016-03-03 MED ORDER — HYDROMORPHONE HCL 1 MG/ML IJ SOLN
0.2500 mg | INTRAMUSCULAR | Status: DC | PRN
  Administered 2016-03-03 (×6): 0.5 mg via INTRAVENOUS

## 2016-03-03 MED ORDER — MORPHINE SULFATE (PF) 2 MG/ML IV SOLN
2.0000 mg | INTRAVENOUS | Status: DC | PRN
  Administered 2016-03-03 (×2): 2 mg via INTRAVENOUS
  Filled 2016-03-03 (×2): qty 1

## 2016-03-03 MED ORDER — FENTANYL CITRATE (PF) 100 MCG/2ML IJ SOLN
25.0000 ug | INTRAMUSCULAR | Status: AC | PRN
  Administered 2016-03-03 (×6): 25 ug via INTRAVENOUS

## 2016-03-03 MED ORDER — ONDANSETRON HCL 4 MG/2ML IJ SOLN: 4.0000 mg | Freq: Once | INTRAMUSCULAR | Status: DC | PRN

## 2016-03-03 MED ORDER — LABETALOL HCL 5 MG/ML IV SOLN
INTRAVENOUS | Status: DC | PRN
  Administered 2016-03-03: 10 mg via INTRAVENOUS

## 2016-03-03 MED ORDER — TAMSULOSIN HCL 0.4 MG PO CAPS
0.4000 mg | ORAL_CAPSULE | Freq: Every day | ORAL | Status: DC
Start: 2016-03-03 — End: 2016-03-05
  Administered 2016-03-04 – 2016-03-05 (×2): 0.4 mg via ORAL
  Filled 2016-03-03 (×2): qty 1

## 2016-03-03 MED ORDER — SODIUM CHLORIDE 0.9% FLUSH
3.0000 mL | Freq: Two times a day (BID) | INTRAVENOUS | Status: DC
  Administered 2016-03-04 – 2016-03-05 (×3): 3 mL via INTRAVENOUS

## 2016-03-03 MED ORDER — HYDROMORPHONE HCL 1 MG/ML IJ SOLN
INTRAMUSCULAR | Status: DC | PRN
  Administered 2016-03-03 (×2): 1 mg via INTRAVENOUS

## 2016-03-03 MED ORDER — SODIUM CHLORIDE 0.9 % IV SOLN
INTRAVENOUS | Status: DC
  Administered 2016-03-03 (×2): via INTRAVENOUS

## 2016-03-03 MED ORDER — ACETAMINOPHEN 325 MG PO TABS
650.0000 mg | ORAL_TABLET | Freq: Four times a day (QID) | ORAL | Status: DC | PRN
  Filled 2016-03-03: qty 2

## 2016-03-03 MED ORDER — ASPIRIN EC 81 MG PO TBEC
81.0000 mg | DELAYED_RELEASE_TABLET | Freq: Every day | ORAL | Status: DC
  Administered 2016-03-05: 81 mg via ORAL
  Filled 2016-03-03 (×2): qty 1

## 2016-03-03 MED ORDER — HYDROMORPHONE HCL 1 MG/ML IJ SOLN
0.2500 mg | INTRAMUSCULAR | Status: DC | PRN
  Administered 2016-03-03: 0.5 mg via INTRAVENOUS

## 2016-03-03 MED ORDER — ACETAMINOPHEN 10 MG/ML IV SOLN
INTRAVENOUS | Status: DC | PRN
  Administered 2016-03-03: 1000 mg via INTRAVENOUS

## 2016-03-03 MED ORDER — FLEET ENEMA 7-19 GM/118ML RE ENEM: 1.0000 | ENEMA | Freq: Once | RECTAL | Status: DC | PRN

## 2016-03-03 MED ORDER — HEPARIN SODIUM (PORCINE) 5000 UNIT/ML IJ SOLN
5000.0000 [IU] | Freq: Three times a day (TID) | INTRAMUSCULAR | Status: DC
  Administered 2016-03-03 – 2016-03-05 (×5): 5000 [IU] via SUBCUTANEOUS
  Filled 2016-03-03 (×5): qty 1

## 2016-03-03 MED ORDER — PROPOFOL 10 MG/ML IV BOLUS
INTRAVENOUS | Status: DC | PRN
  Administered 2016-03-03: 150 mg via INTRAVENOUS
  Administered 2016-03-03: 30 mg via INTRAVENOUS

## 2016-03-03 MED ORDER — FAMOTIDINE 20 MG PO TABS
20.0000 mg | ORAL_TABLET | Freq: Once | ORAL | Status: AC
  Administered 2016-03-03: 20 mg via ORAL

## 2016-03-03 MED ORDER — DOCUSATE SODIUM 100 MG PO CAPS
100.0000 mg | ORAL_CAPSULE | Freq: Two times a day (BID) | ORAL | Status: DC
  Filled 2016-03-03: qty 1

## 2016-03-03 MED ORDER — INSULIN GLARGINE 100 UNIT/ML ~~LOC~~ SOLN
15.0000 [IU] | Freq: Every day | SUBCUTANEOUS | Status: DC
  Administered 2016-03-03 – 2016-03-05 (×3): 15 [IU] via SUBCUTANEOUS
  Filled 2016-03-03 (×5): qty 0.15

## 2016-03-03 MED ORDER — MIDAZOLAM HCL 2 MG/2ML IJ SOLN
INTRAMUSCULAR | Status: DC | PRN
Start: 2016-03-03 — End: 2016-03-03
  Administered 2016-03-03: 1 mg via INTRAVENOUS

## 2016-03-03 MED ORDER — ONDANSETRON HCL 4 MG PO TABS: 4.0000 mg | ORAL_TABLET | Freq: Four times a day (QID) | ORAL | Status: DC | PRN

## 2016-03-03 MED ORDER — SORBITOL 70 % SOLN
30.0000 mL | Freq: Every day | Status: DC | PRN
  Filled 2016-03-03: qty 30

## 2016-03-03 MED ORDER — FENTANYL CITRATE (PF) 100 MCG/2ML IJ SOLN
INTRAMUSCULAR | Status: AC
  Administered 2016-03-03: 25 ug via INTRAVENOUS
  Filled 2016-03-03: qty 2

## 2016-03-03 MED ORDER — DICLOFENAC SODIUM 1 % TD GEL
4.0000 g | Freq: Four times a day (QID) | TRANSDERMAL | Status: DC
  Administered 2016-03-04 – 2016-03-05 (×4): 4 g via TOPICAL
  Filled 2016-03-03: qty 100

## 2016-03-03 MED ORDER — INSULIN ASPART 100 UNIT/ML ~~LOC~~ SOLN
0.0000 [IU] | Freq: Three times a day (TID) | SUBCUTANEOUS | Status: DC
  Administered 2016-03-04 – 2016-03-05 (×5): 2 [IU] via SUBCUTANEOUS
  Filled 2016-03-03 (×5): qty 2

## 2016-03-03 MED ORDER — PHENYLEPHRINE HCL 10 MG/ML IJ SOLN
INTRAMUSCULAR | Status: DC | PRN
  Administered 2016-03-03 (×6): 100 ug via INTRAVENOUS

## 2016-03-03 MED ORDER — ONDANSETRON HCL 4 MG/2ML IJ SOLN: 4.0000 mg | Freq: Four times a day (QID) | INTRAMUSCULAR | Status: DC | PRN

## 2016-03-03 MED ORDER — CEFAZOLIN SODIUM-DEXTROSE 2-4 GM/100ML-% IV SOLN
INTRAVENOUS | Status: AC
  Administered 2016-03-03: 2 g via INTRAVENOUS
  Filled 2016-03-03: qty 100

## 2016-03-03 MED ORDER — ACETAMINOPHEN 650 MG RE SUPP: 650.0000 mg | Freq: Four times a day (QID) | RECTAL | Status: DC | PRN

## 2016-03-03 MED ORDER — OXYCODONE HCL 5 MG PO TABS
5.0000 mg | ORAL_TABLET | Freq: Four times a day (QID) | ORAL | Status: DC | PRN
  Administered 2016-03-03 – 2016-03-04 (×2): 5 mg via ORAL
  Filled 2016-03-03 (×2): qty 1

## 2016-03-03 MED ORDER — METOPROLOL TARTRATE 25 MG PO TABS
25.0000 mg | ORAL_TABLET | Freq: Two times a day (BID) | ORAL | Status: DC
  Administered 2016-03-03 – 2016-03-05 (×3): 25 mg via ORAL
  Filled 2016-03-03 (×4): qty 1

## 2016-03-03 MED ORDER — POLYETHYLENE GLYCOL 3350 17 G PO PACK
17.0000 g | PACK | Freq: Every day | ORAL | Status: DC
  Administered 2016-03-04 – 2016-03-05 (×2): 17 g via ORAL
  Filled 2016-03-03 (×2): qty 1

## 2016-03-03 MED ORDER — TRAZODONE HCL 50 MG PO TABS
50.0000 mg | ORAL_TABLET | Freq: Every day | ORAL | Status: DC
  Administered 2016-03-03 – 2016-03-04 (×2): 50 mg via ORAL
  Filled 2016-03-03 (×2): qty 1

## 2016-03-03 MED ORDER — KETAMINE HCL 50 MG/ML IJ SOLN
INTRAMUSCULAR | Status: DC | PRN
  Administered 2016-03-03: 25 mg via INTRAVENOUS

## 2016-03-03 MED ORDER — CEFAZOLIN SODIUM-DEXTROSE 2-4 GM/100ML-% IV SOLN
2.0000 g | INTRAVENOUS | Status: AC
  Administered 2016-03-03: 2 g via INTRAVENOUS

## 2016-03-03 MED ORDER — FENTANYL CITRATE (PF) 100 MCG/2ML IJ SOLN
INTRAMUSCULAR | Status: DC | PRN
  Administered 2016-03-03 (×2): 25 ug via INTRAVENOUS
  Administered 2016-03-03: 100 ug via INTRAVENOUS
  Administered 2016-03-03 (×3): 25 ug via INTRAVENOUS
  Administered 2016-03-03 (×2): 50 ug via INTRAVENOUS
  Administered 2016-03-03: 25 ug via INTRAVENOUS

## 2016-03-03 MED ORDER — ACETAMINOPHEN 500 MG PO TABS
1000.0000 mg | ORAL_TABLET | Freq: Three times a day (TID) | ORAL | Status: DC | PRN
  Administered 2016-03-05: 1000 mg via ORAL
  Filled 2016-03-03: qty 2

## 2016-03-03 MED ORDER — HYDROMORPHONE HCL 1 MG/ML IJ SOLN
1.0000 mg | INTRAMUSCULAR | Status: DC | PRN
  Administered 2016-03-03 – 2016-03-05 (×9): 1 mg via INTRAVENOUS
  Filled 2016-03-03 (×9): qty 1

## 2016-03-03 MED ORDER — ACETAMINOPHEN 10 MG/ML IV SOLN
INTRAVENOUS | Status: AC
  Filled 2016-03-03: qty 100

## 2016-03-03 MED ORDER — DOCUSATE SODIUM 100 MG PO CAPS
100.0000 mg | ORAL_CAPSULE | Freq: Two times a day (BID) | ORAL | Status: DC
  Administered 2016-03-03 – 2016-03-05 (×4): 100 mg via ORAL
  Filled 2016-03-03 (×3): qty 1

## 2016-03-03 MED ORDER — MAGNESIUM HYDROXIDE 400 MG/5ML PO SUSP: 30.0000 mL | Freq: Every day | ORAL | Status: DC | PRN

## 2016-03-03 MED ORDER — DEXTROSE-NACL 5-0.9 % IV SOLN
INTRAVENOUS | Status: DC
  Administered 2016-03-03 – 2016-03-04 (×3): via INTRAVENOUS

## 2016-03-03 SURGICAL SUPPLY — 40 items
BANDAGE ELASTIC 6 LF NS (GAUZE/BANDAGES/DRESSINGS) IMPLANT
BLADE OSCILLATING/SAGITTAL (BLADE) ×2
BLADE SW THK.38XMED LNG THN (BLADE) ×1 IMPLANT
BNDG COHESIVE 4X5 TAN STRL (GAUZE/BANDAGES/DRESSINGS) ×6 IMPLANT
BNDG GAUZE 4.5X4.1 6PLY STRL (MISCELLANEOUS) ×6 IMPLANT
BRUSH SCRUB 4% CHG (MISCELLANEOUS) ×6 IMPLANT
CANISTER SUCT 1200ML W/VALVE (MISCELLANEOUS) ×3 IMPLANT
CHLORAPREP W/TINT 26ML (MISCELLANEOUS) ×6 IMPLANT
DRAPE BILAT LIMB 76X120 89291 (MISCELLANEOUS) ×3 IMPLANT
DRAPE IMP U-DRAPE 54X76 (DRAPES) ×3 IMPLANT
ELECT CAUTERY BLADE 6.4 (BLADE) ×3 IMPLANT
ELECT REM PT RETURN 9FT ADLT (ELECTROSURGICAL) ×3
ELECTRODE REM PT RTRN 9FT ADLT (ELECTROSURGICAL) ×1 IMPLANT
GAUZE PETRO XEROFOAM 1X8 (MISCELLANEOUS) ×6 IMPLANT
GLOVE BIO SURGEON STRL SZ7 (GLOVE) ×30 IMPLANT
GLOVE INDICATOR 7.5 STRL GRN (GLOVE) IMPLANT
GOWN STRL REUS W/ TWL LRG LVL3 (GOWN DISPOSABLE) ×4 IMPLANT
GOWN STRL REUS W/ TWL XL LVL3 (GOWN DISPOSABLE) ×1 IMPLANT
GOWN STRL REUS W/TWL LRG LVL3 (GOWN DISPOSABLE) ×8
GOWN STRL REUS W/TWL XL LVL3 (GOWN DISPOSABLE) ×2
HANDLE YANKAUER SUCT BULB TIP (MISCELLANEOUS) ×6 IMPLANT
KIT RM TURNOVER STRD PROC AR (KITS) ×3 IMPLANT
LABEL OR SOLS (LABEL) ×3 IMPLANT
NS IRRIG 1000ML POUR BTL (IV SOLUTION) ×3 IMPLANT
PACK EXTREMITY ARMC (MISCELLANEOUS) ×3 IMPLANT
PAD ABD DERMACEA PRESS 5X9 (GAUZE/BANDAGES/DRESSINGS) IMPLANT
PAD PREP 24X41 OB/GYN DISP (PERSONAL CARE ITEMS) ×6 IMPLANT
SPONGE LAP 18X18 5 PK (GAUZE/BANDAGES/DRESSINGS) ×15 IMPLANT
STAPLER SKIN PROX 35W (STAPLE) IMPLANT
STOCKINETTE M/LG 89821 (MISCELLANEOUS) ×6 IMPLANT
SUT ETHILON 3-0 FS-10 30 BLK (SUTURE) ×6
SUT SILK 2 0 (SUTURE) ×2
SUT SILK 2 0 SH (SUTURE) IMPLANT
SUT SILK 2-0 18XBRD TIE 12 (SUTURE) ×1 IMPLANT
SUT SILK 3 0 (SUTURE) ×2
SUT SILK 3-0 18XBRD TIE 12 (SUTURE) ×1 IMPLANT
SUT VIC AB 0 CT1 36 (SUTURE) ×6 IMPLANT
SUT VIC AB 2-0 CT1 (SUTURE) ×6 IMPLANT
SUTURE EHLN 3-0 FS-10 30 BLK (SUTURE) ×2 IMPLANT
TOWEL OR 17X26 4PK STRL BLUE (TOWEL DISPOSABLE) ×3 IMPLANT

## 2016-03-03 NOTE — Op Note (Signed)
OPERATIVE NOTE   PROCEDURE: bilateral transmetatarsal amputation  PRE-OPERATIVE DIAGNOSIS: bilateral toe and foot gangrene  POST-OPERATIVE DIAGNOSIS: same as above  SURGEON: Leotis Pain, MD  ASSISTANT(S): Hezzie Bump, PA-C  ANESTHESIA: general  ESTIMATED BLOOD LOSS: 200 cc  FINDING(S): none  SPECIMEN(S):  bilateral transmetatarsal amputation   INDICATIONS:   Patient is a 70 y.o.female who presents with gangrene of all 10 toes and the distal forefoot bilaterally. This happened secondary to a severe systemic illness requiring pressors with prolonged hypotension. She did not have significant peripheral arterial disease, but does have diabetes mellitus..  The patient is scheduled for bilateral transmetatarsal amputation.  I discussed in depth with the patient the risks, benefits, and alternatives to this procedure.  The patient is aware that the risk of this operation included but are not limited to:  bleeding, infection, myocardial infarction, stroke, death, failure to heal amputation wound, and possible need for more proximal amputation.  The patient is aware of the risks and agrees proceed forward with the procedure.  DESCRIPTION:  After full informed written consent was obtained from the patient, the patient was brought back to the operating room, and placed supine upon the operating table.  Prior to induction, the patient received IV antibiotics.  The patient was then prepped and draped in the standard fashion for bilateral transmetatarsal amputation.  After obtaining adequate anesthesia, the patient was prepped and draped in the standard fashion.  I marked out the skin incision at the base of the toes with a fish mouth approach laterally and medially to allow a tension free closure on both feet.  I started on the right foot. An incision was created with a 15 blade and taken to the bone with electrocautery.  The toes were then dissected off of the MTP joins and freed for removal.  The  bovie and the periostial elevator were used to dissect out the transmetatarsal bones about 2-3 cm proximally and then all five of these were transected with the TPS saw.  The fist metatarsal was taken back about another cm to allow a tension free closure. Ronjours were used to remove any sharp or loose pieces of bone.  At this point, the specimen was passed off the field as right transmetatarsal amputation.  At this point, I clamped all visibly bleeding arteries and veins using a combination of suture ligation with Silk suture and electrocautery.  Bleeding continued to be controlled with electrocautery and suture ligature.  The stump was washed off with sterile normal saline and no further active bleeding was noted.  I reapproximated the anterior and posterior fascia  with interrupted stitches of 0 Vicryl.  This was completed along the entire length of anterior and posterior fascia until there were no more loose space in the fascial line. 4 figure-of-eight 0 Vicryl sutures were placed in the deep space.  I then placed a layer of 2-0 Vicryl sutures in the subcutaneous tissue. The skin was then  reapproximated with a series of mattress 3-0 Nylon sutures.  The stump was washed off and dried.  The incision was dressed with Xeroform and  then fluffs were applied.  Kerlix was wrapped around the foot. I then turned my attention to the left foot. A mirror image procedure was performed on the left foot with an incision made with a 15 blade and then taken down to the MTP joint with electrocautery. The toes were then removed and the metatarsal heads were dissected back to 3 cm and transected with TPS  saw on all 5 metatarsals. All loose pieces of bone or sharp edges were removed with Aldona Lento. The specimen was passed off and hemostasis was achieved. The stump was irrigated with normal saline and no further active bleeding was noted. We reapproximated the anterior and posterior fascia with figure-of-eight 0 Vicryl sutures and  a total of 4 sutures were placed. A layer of 2-0 Vicryl suture was then used to close subcutaneous tissue and the skin was closed with mattress 3-0 nylon sutures. The stump was washed off and dried. The incision was dressed with Xeroform and then fluffs and Kerlix were wrapped around the foot.  She was then taken to the recovery room in stable condition.  COMPLICATIONS: none  CONDITION: stable   Raechell Singleton 03/03/2016 2:00 PM

## 2016-03-03 NOTE — H&P (Signed)
  [facility] History & Physical Update  The patient was interviewed and re-examined.  The patient's previous History and Physical has been reviewed and is unchanged.  There is no change in the plan of care. For B TMAs today. We plan to proceed with the scheduled procedure. Heart RRR, no murmurs Lungs CTAB, no increased respiratory effort April [place], MD  03/03/2016, 12:24 PM

## 2016-03-03 NOTE — Anesthesia Preprocedure Evaluation (Signed)
Anesthesia Evaluation  Patient identified by MRN, date of birth, ID band Patient awake    Reviewed: Allergy & Precautions, NPO status , Patient's Chart, lab work & pertinent test results, reviewed documented beta blocker date and time   Airway Mallampati: II  TM Distance: >3 FB     Dental  (+) Chipped   Pulmonary           Cardiovascular hypertension, Pt. on medications + Past MI  + dysrhythmias Atrial Fibrillation      Neuro/Psych    GI/Hepatic   Endo/Other  diabetes, Type 2  Renal/GU Renal InsufficiencyRenal disease     Musculoskeletal   Abdominal   Peds  Hematology   Anesthesia Other Findings Obese. Afib - paroxysmal.  Reproductive/Obstetrics                             Anesthesia Physical Anesthesia Plan  ASA: III  Anesthesia Plan: General   Post-op Pain Management:    Induction: Intravenous  Airway Management Planned: Oral ETT and LMA  Additional Equipment:   Intra-op Plan:   Post-operative Plan:   Informed Consent: I have reviewed the patients History and Physical, chart, labs and discussed the procedure including the risks, benefits and alternatives for the proposed anesthesia with the patient or authorized representative who has indicated his/her understanding and acceptance.     Plan Discussed with: CRNA  Anesthesia Plan Comments:         Anesthesia Quick Evaluation

## 2016-03-03 NOTE — Progress Notes (Signed)
Per Dr. Lucky Cowboy okay to place order for moderate sliding ACHS fingersticks. No HS coverage. Also okay to place contact order for MRSA

## 2016-03-03 NOTE — Transfer of Care (Signed)
Immediate Anesthesia Transfer of Care Note  Patient: April [name]  Procedure(s) Performed: Procedure(s): TRANSMETATARSAL AMPUTATION (Bilateral)  Patient Location: PACU  Anesthesia Type:General  Level of Consciousness: awake, alert , oriented and patient cooperative  Airway & Oxygen Therapy: Patient Spontanous Breathing and Patient connected to nasal cannula oxygen  Post-op Assessment: Report given to RN and Post -op Vital signs reviewed and stable  Post vital signs: Reviewed and stable  Last Vitals:  Filed Vitals:   03/03/16 1140 03/03/16 1426  Pulse: 70   Temp: 36.8 C 36.5 C  Resp: 16     Last Pain: There were no vitals filed for this visit.       Complications: No apparent anesthesia complications

## 2016-03-03 NOTE — Anesthesia Procedure Notes (Signed)
Procedure Name: LMA Insertion Date/Time: 03/03/2016 12:46 PM Performed by: Rosaria Ferries, Iyonnah Ferrante Pre-anesthesia Checklist: Patient identified, Emergency Drugs available, Suction available and Patient being monitored Patient Re-evaluated:Patient Re-evaluated prior to inductionOxygen Delivery Method: Circle system utilized Preoxygenation: Pre-oxygenation with 100% oxygen Intubation Type: IV induction LMA: LMA inserted LMA Size: 4.0 Number of attempts: 1 Placement Confirmation: breath sounds checked- equal and bilateral Tube secured with: Tape Dental Injury: Teeth and Oropharynx as per pre-operative assessment

## 2016-03-03 NOTE — Progress Notes (Signed)
Spoke with Dr. Delana Meyer on call regarding patient's uncontrolled pain.  Order received to change morphine to hydromorphone.

## 2016-03-03 NOTE — Anesthesia Postprocedure Evaluation (Signed)
Anesthesia Post Note  Patient: April [name]  Procedure(s) Performed: Procedure(s) (LRB): TRANSMETATARSAL AMPUTATION (Bilateral)  Patient location during evaluation: PACU Anesthesia Type: General Level of consciousness: awake and alert Pain management: pain level controlled Vital Signs Assessment: post-procedure vital signs reviewed and stable Respiratory status: spontaneous breathing, nonlabored ventilation, respiratory function stable and patient connected to nasal cannula oxygen Cardiovascular status: blood pressure returned to baseline and stable Postop Assessment: no signs of nausea or vomiting Anesthetic complications: no    Last Vitals:  Filed Vitals:   03/03/16 1812 03/03/16 1845  BP: 138/67 154/69  Pulse: 95 97  Temp: 37 C 36.6 C  Resp: 18 18    Last Pain:  Filed Vitals:   03/03/16 1858  PainSc: [place]

## 2016-03-04 ENCOUNTER — Encounter: Payer: Self-pay | Admitting: Vascular Surgery

## 2016-03-04 DIAGNOSIS — E1152 Type 2 diabetes mellitus with diabetic peripheral angiopathy with gangrene: Secondary | ICD-10-CM | POA: Diagnosis not present

## 2016-03-04 LAB — GLUCOSE, CAPILLARY
GLUCOSE-CAPILLARY: 138 mg/dL — AB (ref 65–99)
GLUCOSE-CAPILLARY: 124 mg/dL — AB (ref 65–99)
GLUCOSE-CAPILLARY: 119 mg/dL — AB (ref 65–99)
Glucose-Capillary: 141 mg/dL — ABNORMAL HIGH (ref 65–99)

## 2016-03-04 LAB — BASIC METABOLIC PANEL
ANION GAP: 8 (ref 5–15)
BUN: 11 mg/dL (ref 6–20)
CALCIUM: 8.5 mg/dL — AB (ref 8.9–10.3)
CHLORIDE: 104 mmol/L (ref 101–111)
CO2: 24 mmol/L (ref 22–32)
CREATININE: 0.63 mg/dL (ref 0.44–1.00)
GFR calc non Af Amer: >60 mL/min (ref >60)
Glucose, Bld: 115 mg/dL — ABNORMAL HIGH (ref 65–99)
Potassium: 3.4 mmol/L — ABNORMAL LOW (ref 3.5–5.1)
SODIUM: 136 mmol/L (ref 135–145)

## 2016-03-04 LAB — CBC
HCT: 32.8 % — ABNORMAL LOW (ref 35.0–47.0)
HEMOGLOBIN: 10.5 g/dL — AB (ref 12.0–16.0)
MCH: 28.3 pg (ref 26.0–34.0)
MCHC: 32.1 g/dL (ref 32.0–36.0)
MCV: 88.2 fL (ref 80.0–100.0)
PLATELETS: 262 10*3/uL (ref 150–440)
RBC: 3.71 MIL/uL — AB (ref 3.80–5.20)
RDW: 15.5 % — ABNORMAL HIGH (ref 11.5–14.5)
WBC: 13.9 10*3/uL — AB (ref 3.6–11.0)

## 2016-03-04 MED ORDER — OXYCODONE HCL 5 MG PO TABS: 5.0000 mg | ORAL_TABLET | Freq: Four times a day (QID) | ORAL | Status: DC | PRN

## 2016-03-04 MED ORDER — ASPIRIN 81 MG PO TBEC: 81.0000 mg | DELAYED_RELEASE_TABLET | Freq: Every day | ORAL | Status: DC

## 2016-03-04 NOTE — Progress Notes (Signed)
     [facility]  Daily Progress Note   Subjective  - 1 Day Post-Op  [place], very emotional Needing IV pain medications  Objective Filed Vitals:   03/03/16 1845 03/03/16 2015 03/04/16 0316 03/04/16 0502  BP: 154/69 137/61 114/43   Pulse: 97 91 75   Temp: 97.8 F (36.6 C) 98 F (36.7 C) 98.2 F (36.8 C)   TempSrc: Oral Oral Oral   Resp: '18 18 19   '$ Height:      Weight:    98.975 kg (218 lb 3.2 oz)  SpO2: 96% 99% 99%     Intake/Output Summary (Last 24 hours) at 03/04/16 1336 Last data filed at 03/04/16 1332  Gross per 24 hour  Intake   2808 ml  Output    200 ml  Net   2608 ml    PULM  CTAB CV  RRR VASC  Dressings C/D/I  Laboratory CBC    Component Value Date/Time   WBC 13.9* 03/04/2016 0500   HGB 10.5* 03/04/2016 0500   HCT 32.8* 03/04/2016 0500   PLT 262 03/04/2016 0500    BMET    Component Value Date/Time   NA 136 03/04/2016 0500   K 3.4* 03/04/2016 0500   CL 104 03/04/2016 0500   CO2 24 03/04/2016 0500   GLUCOSE 115* 03/04/2016 0500   BUN 11 03/04/2016 0500   CREATININE 0.63 03/04/2016 0500   CALCIUM 8.5* 03/04/2016 0500   GFRNONAA >60 03/04/2016 0500   GFRAA >60 03/04/2016 0500    Assessment/Planning: POD #1 s/p bilateral TMAs   Plan for discharge to rehab tomorrow  Having pain requiring IV narcotics, so no discharge today but will plan for tomorrow  RTC two weeks with [place] for suture removal and wound check    DEW,JASON  03/04/2016, 1:36 PM

## 2016-03-04 NOTE — Care Management (Signed)
PT recommending SNF.  Patient to discharge 03/05/16.  Open with [facility] for RN, PT, SW and aide.   Corene Cornea with [facility] aware of admission.

## 2016-03-04 NOTE — Care Management Obs Status (Signed)
[place] NOTIFICATION   Patient Details  Name: April [name] MRN: [id number] Date of Birth: Dec 07, 1945   Medicare Observation Status Notification Given:  Yes    Beverly Sessions, RN 03/04/2016, 5:46 PM

## 2016-03-04 NOTE — Discharge Summary (Signed)
[facility]    Discharge Summary    Patient ID:  April [name] MRN: [id number] DOB/AGE: 70-Mar-1947 70 y.o.  Admit date: 03/03/2016 Discharge date: 03/04/2016 Date of Surgery: 03/03/2016 Surgeon: Surgeon(s): Algernon Huxley, MD  Admission Diagnosis: GANGRENE OF FOOT  Discharge Diagnoses:  GANGRENE OF FOOT  Secondary Diagnoses: Past Medical History  Diagnosis Date  . Renal disorder   . NSTEMI (non-ST elevated myocardial infarction) ([place])   . Acute renal failure (ARF) ([place])   . Type 2 diabetes mellitus ([place])   . Paroxysmal atrial fibrillation (HCC)   . Hypertension   . Sepsis [facility]) January 2017    [facility]    Procedure(s): TRANSMETATARSAL AMPUTATION  Discharged Condition: good  HPI:  Patient with bilateral gangrenous toes and forefoot.  Here for TMAs  Hospital Course:  April [name] is a 70 y.o. female is S/P bilateral Procedure(s): TRANSMETATARSAL AMPUTATION Extubated: in OR Physical exam: dressing C/D/I Post-op wounds C/D/I Pt. Ambulating, voiding and taking PO diet without difficulty. Pt pain controlled with PO pain meds. Labs as below Complications:none  Consults:     Significant Diagnostic Studies: CBC Lab Results  Component Value Date   WBC 13.9* 03/04/2016   HGB 10.5* 03/04/2016   HCT 32.8* 03/04/2016   MCV 88.2 03/04/2016   PLT 262 03/04/2016    BMET    Component Value Date/Time   NA 136 03/04/2016 0500   K 3.4* 03/04/2016 0500   CL 104 03/04/2016 0500   CO2 24 03/04/2016 0500   GLUCOSE 115* 03/04/2016 0500   BUN 11 03/04/2016 0500   CREATININE 0.63 03/04/2016 0500   CALCIUM 8.5* 03/04/2016 0500   GFRNONAA >60 03/04/2016 0500   GFRAA >60 03/04/2016 0500   COAG Lab Results  Component Value Date   INR 1.04 02/25/2016   INR 1.14 12/03/2015   INR 1.52 10/21/2015     Disposition:  Discharge to :inpatient rehab     Medication List    TAKE these medications        acetaminophen 500 MG tablet  Commonly  known as:  TYLENOL  Take 1,000 mg by mouth every 8 (eight) hours as needed for moderate pain. Reported on 12/16/2015     aspirin 81 MG EC tablet  Take 1 tablet (81 mg total) by mouth daily.     diclofenac sodium 1 % Gel  Commonly known as:  VOLTAREN  Apply 4 g topically 4 (four) times daily.     docusate sodium 100 MG capsule  Commonly known as:  COLACE  Take 1 capsule (100 mg total) by mouth 2 (two) times daily.     insulin glargine 100 UNIT/ML injection  Commonly known as:  LANTUS  Inject 0.15 mLs (15 Units total) into the skin daily.     metoprolol tartrate 25 MG tablet  Commonly known as:  LOPRESSOR  Take 1 tablet (25 mg total) by mouth 2 (two) times daily.     ondansetron 4 MG tablet  Commonly known as:  ZOFRAN  Take 1 tablet (4 mg total) by mouth every 6 (six) hours as needed for nausea.     oxyCODONE 5 MG immediate release tablet  Commonly known as:  Oxy IR/ROXICODONE  Take 1 tablet (5 mg total) by mouth every 6 (six) hours as needed for moderate pain or severe pain.     oxyCODONE 5 MG immediate release tablet  Commonly known as:  Oxy IR/ROXICODONE  Take 1 tablet (5 mg total) by  mouth every 6 (six) hours as needed for moderate pain or severe pain.     polyethylene glycol packet  Commonly known as:  MIRALAX / GLYCOLAX  Take 17 g by mouth daily.     tamsulosin 0.4 MG Caps capsule  Commonly known as:  FLOMAX  Take 1 capsule (0.4 mg total) by mouth daily.     traZODone 50 MG tablet  Commonly known as:  DESYREL  Take 50 mg by mouth at bedtime.       Verbal and written Discharge instructions given to the patient. Wound care per Discharge AVS     Follow-up Information    Follow up with [facility] .   Specialty:  [place] information:   [facility] [place] [phone]      Please remove dressings on Tuesday, 6/13 and replace vasoline gauze and Kerlex dressings every other  day  Signed: Leotis Pain, MD  03/04/2016, 1:41 PM

## 2016-03-04 NOTE — NC FL2 (Signed)
[place] LEVEL OF CARE SCREENING TOOL     IDENTIFICATION  Patient Name: April [name] Birthdate: 08-17-1946 Sex: female Admission Date (Current Location): 03/03/2016  [facility] and [place] Number:  Engineering geologist and Address:  [facility], [address], [place], [place] [address]      Provider Number: [id number]  Attending Physician Name and Address:  Algernon Huxley, MD  Relative Name and Phone Number:       Current Level of Care: Hospital Recommended Level of Care: [place] Prior Approval Number:    Date Approved/Denied:   PASRR Number:    Discharge Plan: SNF    Current Diagnoses: Patient Active Problem List   Diagnosis Date Noted  . Gangrene of foot ([place]) 03/03/2016  . UPJ (ureteropelvic junction) obstruction   . Paroxysmal atrial fibrillation ([place]) 12/04/2015  . Back pain 12/04/2015  . Thrombocytopenia ([place]) 12/03/2015  . Leukocytosis 12/03/2015  . Type 2 diabetes mellitus ([place]) 12/03/2015  . Respiratory failure ([place])   . Proteus septicemia ([place])   . Acute renal failure (ARF) ([place])   . Hydronephrosis   . Endotracheally intubated   . Respiratory distress   . Arterial hypotension   . Sepsis ([place]) 10/20/2015  . Acute renal failure ([place])   . Altered mental status   . NSTEMI (non-ST elevated myocardial infarction) ([place])   . Sepsis due to urinary tract infection (HCC)     Orientation RESPIRATION BLADDER Height & Weight     Self, Time, Situation, Place  O2, Normal (2 liters O2) Continent Weight: 218 lb 3.2 oz (98.975 kg) Height:  '5\' 7"'$  (170.2 cm)  BEHAVIORAL SYMPTOMS/MOOD NEUROLOGICAL BOWEL NUTRITION STATUS   (none)  (none) Continent Diet (heart healthy/carb modified)  AMBULATORY STATUS COMMUNICATION OF NEEDS Skin   Extensive Assist Verbally Surgical wounds                       Personal Care Assistance Level of Assistance  Bathing, Dressing Bathing Assistance: Limited assistance   Dressing  Assistance: Limited assistance     Functional Limitations Info             SPECIAL CARE FACTORS FREQUENCY  PT (By licensed PT)                    Contractures Contractures Info: Not present    Additional Factors Info  Code Status, Allergies Code Status Info: full Allergies Info: prednisone           Current Medications (03/04/2016):  This is the current hospital active medication list Current Facility-Administered Medications  Medication Dose Route Frequency Provider Last Rate Last Dose  . acetaminophen (TYLENOL) tablet 650 mg  650 mg Oral Q6H PRN Algernon Huxley, MD       Or  . acetaminophen (TYLENOL) suppository 650 mg  650 mg Rectal Q6H PRN Algernon Huxley, MD      . acetaminophen (TYLENOL) tablet 1,000 mg  1,000 mg Oral Q8H PRN Algernon Huxley, MD      . aspirin EC tablet 81 mg  81 mg Oral Daily Algernon Huxley, MD   81 mg at 03/03/16 1745  . dextrose 5 %-0.9 % sodium chloride infusion   Intravenous Continuous Algernon Huxley, MD 75 mL/hr at 03/04/16 [phone]    . diclofenac sodium (VOLTAREN) 1 % transdermal gel 4 g  4 g Topical QID Algernon Huxley, MD   4 g at 03/03/16  1806  . docusate sodium (COLACE) capsule 100 mg  100 mg Oral BID Algernon Huxley, MD   100 mg at 03/03/16 2126  . heparin injection 5,000 Units  5,000 Units Subcutaneous Q8H Algernon Huxley, MD   5,000 Units at 03/04/16 [phone]  . HYDROcodone-acetaminophen (NORCO/VICODIN) 5-325 MG per tablet 1-2 tablet  1-2 tablet Oral Q4H PRN Algernon Huxley, MD   1 tablet at 03/04/16 [phone]  . HYDROmorphone (DILAUDID) injection 1 mg  1 mg Intravenous Q3H PRN Katha Cabal, MD   1 mg at 03/04/16 0711  . insulin aspart (novoLOG) injection 0-15 Units  0-15 Units Subcutaneous TID WC Florene Glen, MD      . insulin glargine (LANTUS) injection 15 Units  15 Units Subcutaneous Daily Algernon Huxley, MD   15 Units at 03/03/16 1857  . magnesium hydroxide (MILK OF MAGNESIA) suspension 30 mL  30 mL Oral Daily PRN Algernon Huxley, MD      . metoprolol tartrate (LOPRESSOR)  tablet 25 mg  25 mg Oral BID Algernon Huxley, MD   25 mg at 03/03/16 2126  . ondansetron (ZOFRAN) tablet 4 mg  4 mg Oral Q6H PRN Algernon Huxley, MD       Or  . ondansetron [facility]) injection 4 mg  4 mg Intravenous Q6H PRN Algernon Huxley, MD      . ondansetron [facility]) tablet 4 mg  4 mg Oral Q6H PRN Algernon Huxley, MD      . oxyCODONE (Oxy IR/ROXICODONE) immediate release tablet 5 mg  5 mg Oral Q6H PRN Algernon Huxley, MD   5 mg at 03/04/16 0259  . polyethylene glycol (MIRALAX / GLYCOLAX) packet 17 g  17 g Oral Daily Algernon Huxley, MD   17 g at 03/03/16 1745  . sodium chloride flush (NS) 0.9 % injection 3 mL  3 mL Intravenous Q12H Algernon Huxley, MD   3 mL at 03/03/16 2127  . sodium phosphate (FLEET) 7-19 GM/118ML enema 1 enema  1 enema Rectal Once PRN Algernon Huxley, MD      . sorbitol 70 % solution 30 mL  30 mL Oral Daily PRN Algernon Huxley, MD      . tamsulosin (FLOMAX) capsule 0.4 mg  0.4 mg Oral Daily Algernon Huxley, MD   0.4 mg at 03/03/16 1745  . traZODone (DESYREL) tablet 50 mg  50 mg Oral QHS Algernon Huxley, MD   50 mg at 03/03/16 2126     Discharge Medications: Please see discharge summary for a list of discharge medications.  Relevant Imaging Results:  Relevant Lab Results:   Additional Information SS: [id number]  Shela Leff, LCSW

## 2016-03-04 NOTE — Evaluation (Signed)
Physical Therapy Evaluation Patient Details Name: April [name] MRN: [id number] DOB: 1946-08-18 Today's Date: 03/04/2016   History of Present Illness  Pt underwent bilateral transmetatarsal amputations and is POD#1 at time of evaluation. No reported post-op complications except for difficult to control pain. Pt reports 2 falls in the last 12 months  Clinical Impression  Pt is agreeable to work with physical therapy and completes all exercises and transfers as instructed. She is able to complete supine exercises with increase in bilateral LE pain. Exercises performed slowly but able to complete 10 reps of each. She requires minA+1 for bed mobility with sidelying to sitting. CGA only for sliding board transfers from bed to recliner. Pt is TTWB on bilateral LEs so she is unable to attempt squat or standing pivot transfers. Unable to ambulate at this time. Pt does report high levels of pain but appears to be well medicated prior to PT session which will be important for her participation. Pt will need SNF placement to work on bilateral UE/LE strength, sliding board transfers, and bed mobility. She is TTWB per MD order for 1 week and then can progress to FWB. Pt will benefit from skilled PT services to address deficits in strength, balance, and mobility in order to return to full function at home.     Follow Up Recommendations SNF    Equipment Recommendations  Other (comment) (TBD at SNF, may need sliding board and wheelchair. hosp bed?)    Recommendations for Other Services       Precautions / Restrictions Precautions Precautions: Fall Restrictions Weight Bearing Restrictions: Yes RLE Weight Bearing: Touchdown weight bearing LLE Weight Bearing: Touchdown weight bearing Other Position/Activity Restrictions: Per MD order, TTWB x 1 week followed by FWB      Mobility  Bed Mobility Overal bed mobility: Needs Assistance Bed Mobility: Supine to Sit     Supine to sit: Min assist     General  bed mobility comments: Pt able to move bilateral LEs off L side of bed and push up to partial sitting without assistance requires additional assistance to come fully upright to sitting. Once in sitting is stable without UE or LE support  Transfers Overall transfer level: Needs assistance Equipment used:  (Sliding board) Transfers: Lateral/Scoot Transfers          Lateral/Scoot Transfers: Min guard General transfer comment: Pt able to perform sliding board transfer from bed to recliner with CGA for safety to prevent her from sliding off front of board. Pt demonstrates good UE strength and safe sequencing. She does slide slightly forward but is able to maintain safe balance. Pt is TTWB on bilateral LE so unable to bear weight through either LE. Unable to perform squat or stand pivot transfer  Ambulation/Gait             General Gait Details: Unable, TTWB bilateral  Stairs            Wheelchair Mobility    Modified Rankin (Stroke Patients Only)       Balance Overall balance assessment: Needs assistance Sitting-balance support: No upper extremity supported Sitting balance-Leahy Scale: Good                                       Pertinent Vitals/Pain Pain Assessment: 0-10 Pain Score: 5  Pain Location: Bilateral feet. Pt reports increase in pain with movement. Reports history of chronic low back pain but  not currently Pain Descriptors / Indicators: Aching Pain Intervention(s): Limited activity within patient's tolerance;Monitored during session;Premedicated before session    Home Living Family/patient expects to be discharged to:: Private residence Living Arrangements: Alone Available Help at Discharge: Friend(s) Type of Home: House Home Access: Stairs to enter Entrance Stairs-Rails: Psychiatric nurse of Steps: [place]: One level Home Equipment: Environmental consultant - 2 wheels;Cane - single point (no shower seat, no BSC, no wheelchair, no  hospital bed)      Prior Function Level of Independence: Independent         Comments: Pt reports she was previously independent with ADLs/IADLs.      Hand Dominance   Dominant Hand: Right    Extremity/Trunk Assessment   Upper Extremity Assessment: Overall WFL for tasks assessed           Lower Extremity Assessment: Generalized weakness;RLE deficits/detail RLE Deficits / Details: Pt able to perform partial SLR due to weakness/pain bilaterally. Able to perform SAQ without assistance. Limited DF/PF due to pain bilatearlly. Unclear if weakness or pain but unable to test strength due to dressings and pain. Decreased bilateral LE sensation due to neuropathy       Communication   Communication: No difficulties  Cognition Arousal/Alertness: Awake/alert Behavior During Therapy: Restless;Anxious Overall Cognitive Status: Within Functional Limits for tasks assessed                      General Comments      Exercises General Exercises - Lower Extremity Ankle Circles/Pumps: Strengthening;Both;10 reps;Supine Quad Sets: Strengthening;Both;10 reps;Supine Gluteal Sets: Strengthening;Both;10 reps;Supine Short Arc Quad: Strengthening;Both;10 reps;Supine Hip ABduction/ADduction: Strengthening;Both;10 reps;Supine Straight Leg Raises: Strengthening;Both;10 reps;Supine      Assessment/Plan    PT Assessment Patient needs continued PT services  PT Diagnosis Generalized weakness;Acute pain   PT Problem List Decreased strength;Decreased activity tolerance;Decreased mobility;Decreased knowledge of use of DME;Decreased safety awareness;Decreased knowledge of precautions;Obesity;Pain  PT Treatment Interventions DME instruction;Gait training;Functional mobility training;Therapeutic activities;Therapeutic exercise;Balance training;Neuromuscular re-education;Cognitive remediation;Patient/family education;Manual techniques;Stair training;Other (comment) (Gait training once WB  restrictions lifted)   PT Goals (Current goals can be found in the Care Plan section) Acute Rehab PT Goals Patient Stated Goal: To be able to walk PT Goal Formulation: With patient Time For Goal Achievement: 03/18/16 Potential to Achieve Goals: Fair    Frequency 7X/week   Barriers to discharge Decreased caregiver support Lives alone    Co-evaluation               End of Session Equipment Utilized During Treatment: Other (comment);Oxygen (sliding board) Activity Tolerance: Patient limited by pain Patient left: in chair;with call bell/phone within reach;with chair alarm set;Other (comment) (Bilateral LEs elevated) Nurse Communication: Mobility status;Other (comment) (Extensive discussion with RN and aid about sliding board )    Functional Assessment Tool Used: clinical judgement Functional Limitation: Mobility: Walking and moving around Mobility: Walking and Moving Around Current Status (W2993): At least 80 percent but less than 100 percent impaired, limited or restricted Mobility: Walking and Moving Around Goal Status [phone]): At least 20 percent but less than 40 percent impaired, limited or restricted    Time: 1030-1122 PT Time Calculation (min) (ACUTE ONLY): 52 min   Charges:   PT Evaluation $PT Eval High Complexity: 1 Procedure PT Treatments $Therapeutic Exercise: 8-22 mins   PT G Codes:   PT G-Codes **NOT FOR INPATIENT CLASS** Functional Assessment Tool Used: clinical judgement Functional Limitation: Mobility: Walking and moving around Mobility: Walking and Moving Around Current Status (V8938):  At least 80 percent but less than 100 percent impaired, limited or restricted Mobility: Walking and Moving Around Goal Status [phone]): At least 20 percent but less than 40 percent impaired, limited or restricted   Phillips Grout PT, DPT   Zymir Napoli 03/04/2016, 11:36 AM

## 2016-03-04 NOTE — Clinical Social Work Note (Signed)
MD has stated patient will be ready for discharge tomorrow, thus patient will have to go on a letter of guarantee to a skilled nursing facility that accepts LOG's. The only facility that offered that will accept an LOG is [facility]. CSW informed patient of this and she stated she would not go to [facility] because they gave her MRSA. CSW informed her that the only offer for an LOG was from [facility] and that it would need to be [facility] or she could return home with home health. Patient is very histrionic and pretends to cry if she is not happy with what she is being told. CSW was able to get Johnathon at [facility] to agree to a 5 day LOG and the CSW Surveyor, [facility]) also approved the LOG. Patient to be discharged tomorrow and will need to go to [facility] or return home.  Shela Leff MSW,LCSW [phone]

## 2016-03-04 NOTE — Clinical Social Work Note (Addendum)
Clinical Social Work Assessment  Patient Details  Name: April [name] MRN: [id number] Date of Birth: 01-17-1946  Date of referral:  03/04/16               Reason for consult:  Facility Placement                Permission sought to share information with:  Facility Art therapist granted to share information::  Yes, Verbal Permission Granted  Name::        Agency::     Relationship::     Contact Information:     Housing/Transportation Living arrangements for the past 2 months:  [place], [place] of Information:  Patient Patient Interpreter Needed:  None Criminal Activity/Legal Involvement Pertinent to Current Situation/Hospitalization:  No - Comment as needed Significant Relationships:  None Lives with:  Self Do you feel safe going back to the place where you live?    Need for family participation in patient care:     Care giving concerns:  Patient lives alone and has just had amputations of her toes.   Social Worker assessment / plan:  CSW notified by MD that patient more than likely will need rehab. Patient did not arrive to medical floor until after 5. Patient has medicare humana and will require auth again and will require a PT evaluation. CSW met with patient this morning and she was on the phone and asked me to sit while she finished her conversation on the phone. When I informed her I would come back, she asked me to wait and continued talking on the phone for a few minutes then ended her conversation. Patient was sent for short term rehab after a prior admission recently. Patient is aware of the process however CSW reminded her and let her know that we would have to have PT evaluation in order to submit to her insurance to get prior auth for STR again. Patient then stated "Oh, I'm in too much pain and cannot even move. I can't do anything with PT." CSW advised she speak with her physician about this and reiterated that we would  not be able to place her for rehab if she did not participate.  Employment status:  Retired Nurse, adult PT Recommendations:  [place] / Referral to community resources:  [place]  Patient/Family's Response to care:  Patient was hesitant to want to participate in her recovery due to her level of pain.  Patient/Family's Understanding of and Emotional Response to Diagnosis, Current Treatment, and Prognosis:  Patient wanting to go to STR however, not willing to participate in PT at this time. PT will still need to attempt to evaluate and we will see if patient will be more willing at that time.  Emotional Assessment Appearance:  Appears stated age Attitude/Demeanor/Rapport:  Self-Absorbed, Complaining Affect (typically observed):  Calm, Apprehensive Orientation:  Oriented to Self, Oriented to Place, Oriented to  Time, Oriented to Situation Alcohol / Substance use:  Not Applicable Psych involvement (Current and /or in the community):  No (Comment)  Discharge Needs  Concerns to be addressed:  Care Coordination Readmission within the last 30 days:  No Current discharge risk:  None Barriers to Discharge:  No Barriers Identified   Shela Leff, LCSW 03/04/2016, 8:34 AM

## 2016-03-05 DIAGNOSIS — E1152 Type 2 diabetes mellitus with diabetic peripheral angiopathy with gangrene: Secondary | ICD-10-CM | POA: Diagnosis not present

## 2016-03-05 LAB — GLUCOSE, CAPILLARY
GLUCOSE-CAPILLARY: 138 mg/dL — AB (ref 65–99)
Glucose-Capillary: 127 mg/dL — ABNORMAL HIGH (ref 65–99)

## 2016-03-05 NOTE — Progress Notes (Signed)
Patient is being discharge to SNF in a stable condition,dsg to bil foot CDI , report given to floor nurse , pick up by EMS

## 2016-03-05 NOTE — Progress Notes (Signed)
Clinical Social Worker was informed that patient will be medically ready to discharge to [facility]. Patient is in a agreement with plan. CSW called [facility] to confirm that patient's bed is ready. Provided patient's room number 3A and number to call for report [phone] . All discharge information faxed to [facility] via [place]. Rx's added to discharge packet.   Call to patient's uncle- Kennith Center, left message to inform him patient would discharge to [facility]. RN will call report and patient will discharge to [facility] via [facility].  Ernest Pine, MSW, [place] Social Work Department [phone]

## 2016-03-05 NOTE — Clinical Social Work Placement (Signed)
   CLINICAL SOCIAL WORK PLACEMENT  NOTE  Date:  03/05/2016  Patient Details  Name: April [name] MRN: [id number] Date of Birth: [birth year]  Clinical Social Work is seeking post-discharge placement for this patient at the [place] level of care (*CSW will initial, date and re-position this form in  chart as items are completed):  No   Patient/family provided with [place] Work Department's list of facilities offering this level of care within the geographic area requested by the patient (or if unable, by the patient's family).  No   Patient/family informed of their freedom to choose among providers that offer the needed level of care, that participate in Medicare, Medicaid or managed care program needed by the patient, have an available bed and are willing to accept the patient.  No   Patient/family informed of [facility]'s ownership interest in [facility] and [facility], as well as of the fact that they are under no obligation to receive care at these facilities.  PASRR submitted to EDS on       PASRR number received on       Existing PASRR number confirmed on 03/05/16     FL2 transmitted to all facilities in geographic area requested by pt/family on 03/05/16     FL2 transmitted to all facilities within larger geographic area on       Patient informed that his/her managed care company has contracts with or will negotiate with certain facilities, including the following:        Yes   Patient/family informed of bed offers received.  Patient chooses bed at  [facility])     Physician recommends and patient chooses bed at      Patient to be transferred to  [facility]) on 03/05/16.  Patient to be transferred to facility by  (EMS)     Patient family notified on 03/05/16 of transfer.  Name of family member notified:   Kennith Center- Uncle)     PHYSICIAN       Additional Comment:    _______________________________________________ Baldemar Lenis, LCSW 03/05/2016, 9:02 AM

## 2016-03-07 LAB — SURGICAL PATHOLOGY

## 2016-03-24 IMAGING — US US RENAL
1 series · 14 of 25 positions shown · non-contrast
Comparison: CT, 10/27/2015.  Renal ultrasound, 10/21/2015.

CLINICAL DATA: Followup for hydronephrosis.

EXAM:
RENAL / URINARY TRACT ULTRASOUND COMPLETE

[Series 1: us renal · 0.29mm/px · 14 of 43 slices shown]
[im 1/43]
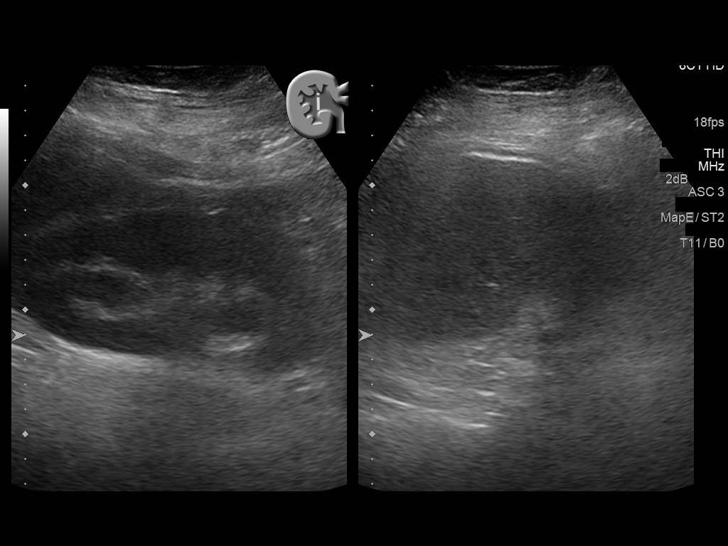
[im 4/43]
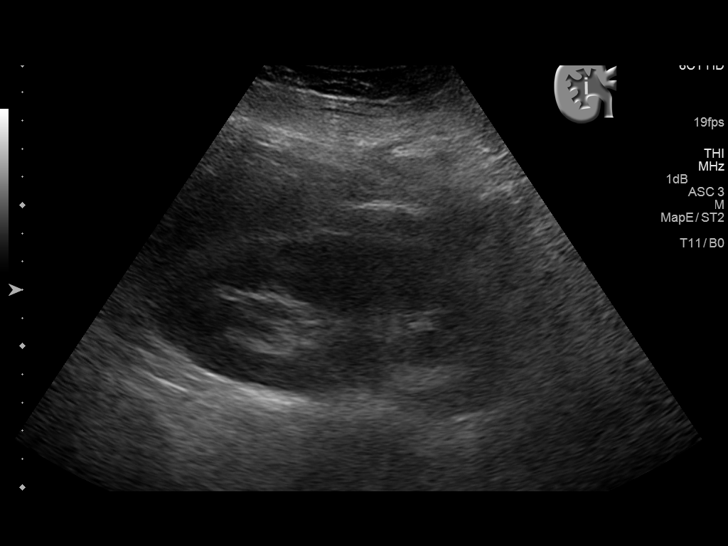
[im 8/43]
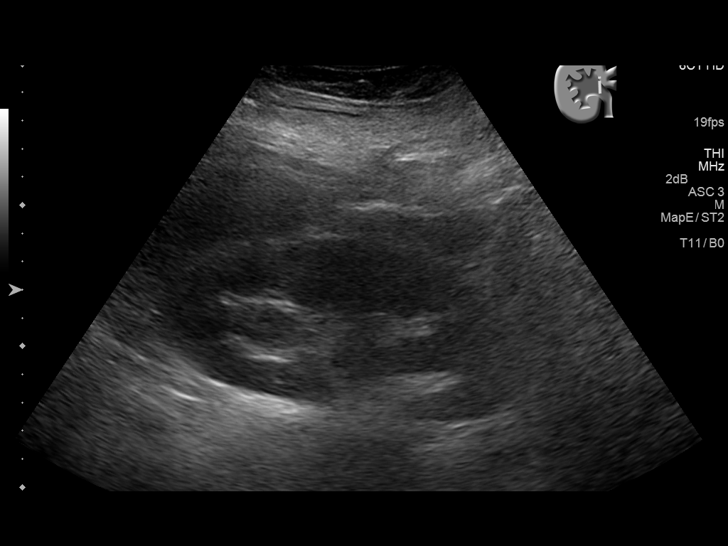
[im 11/43]
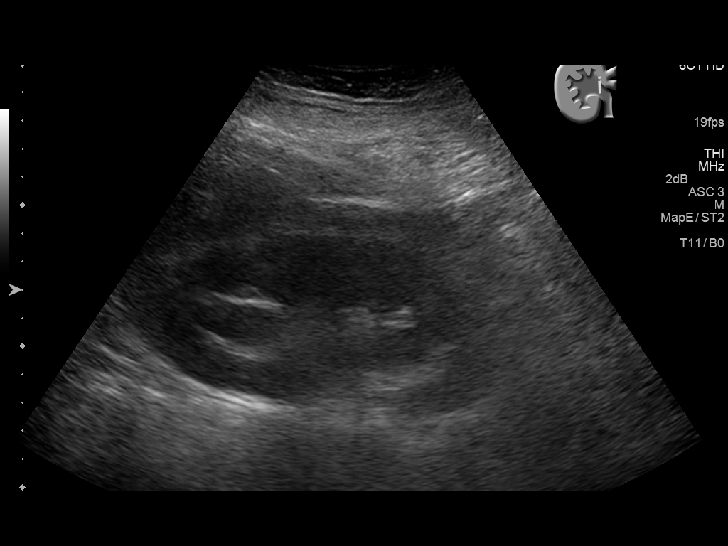
[im 15/43]
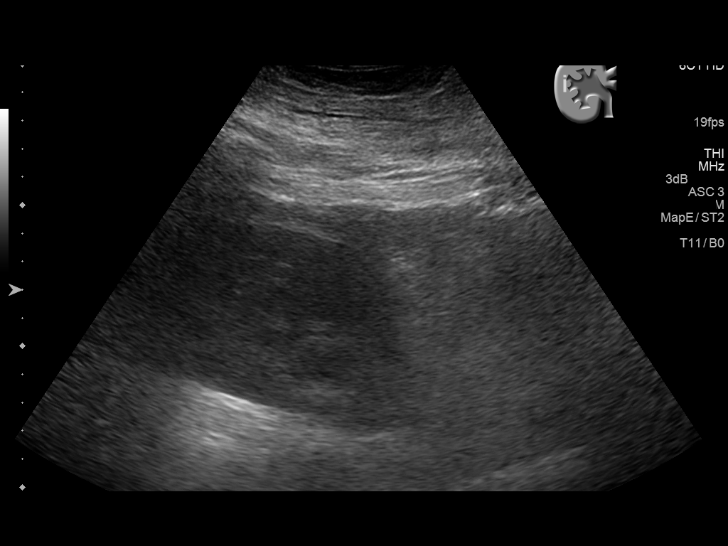
[im 16/43]
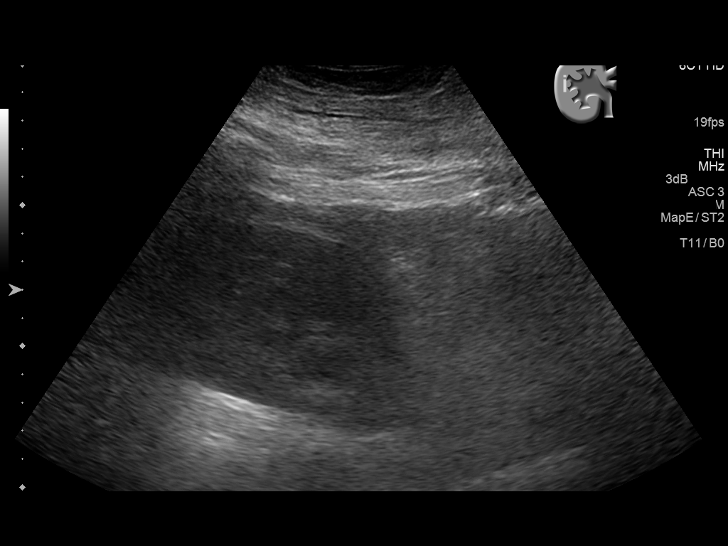
[im 20/43]
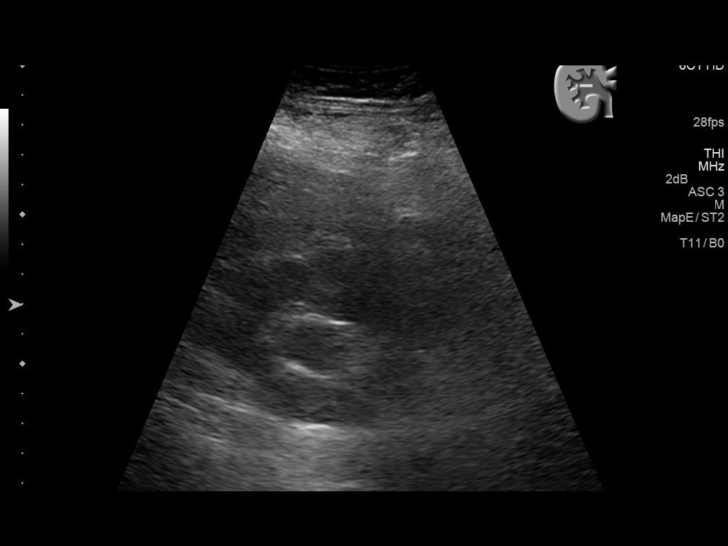
[im 23/43]
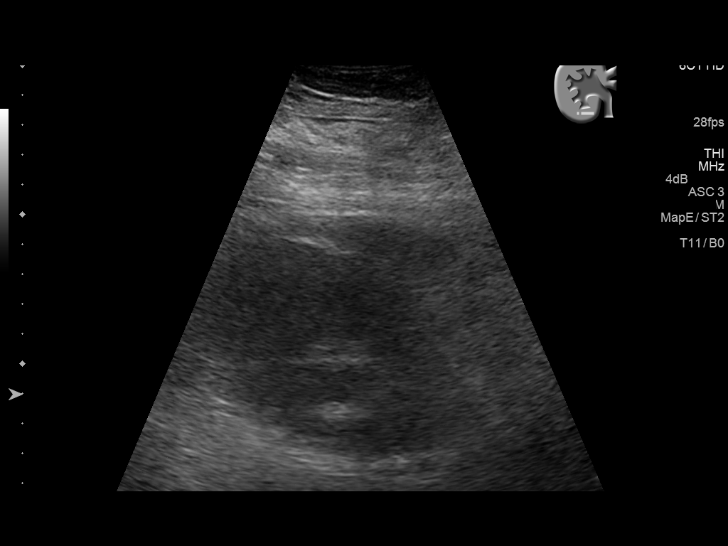
[im 27/43]
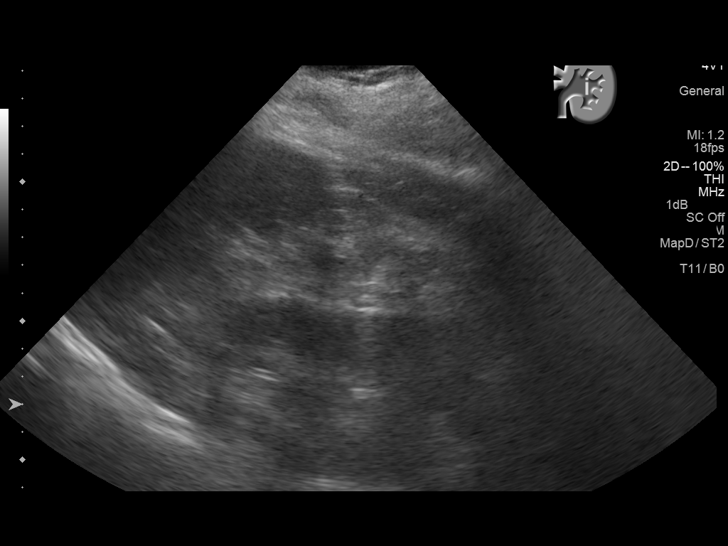
[im 29/43]
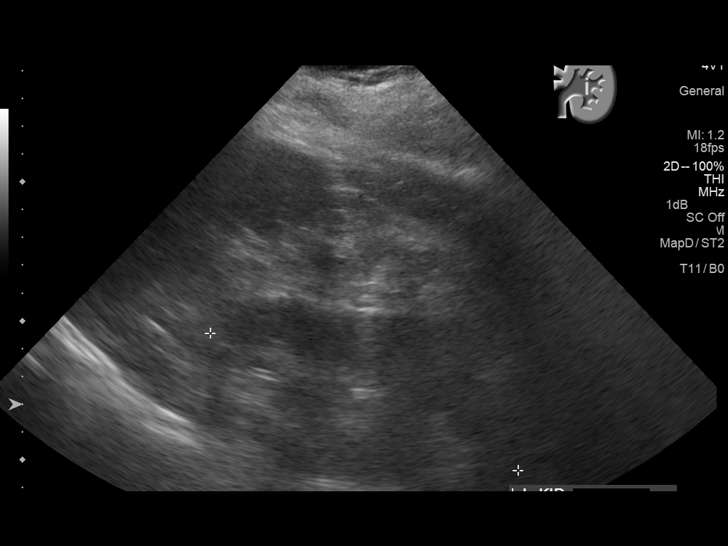
[im 32/43]
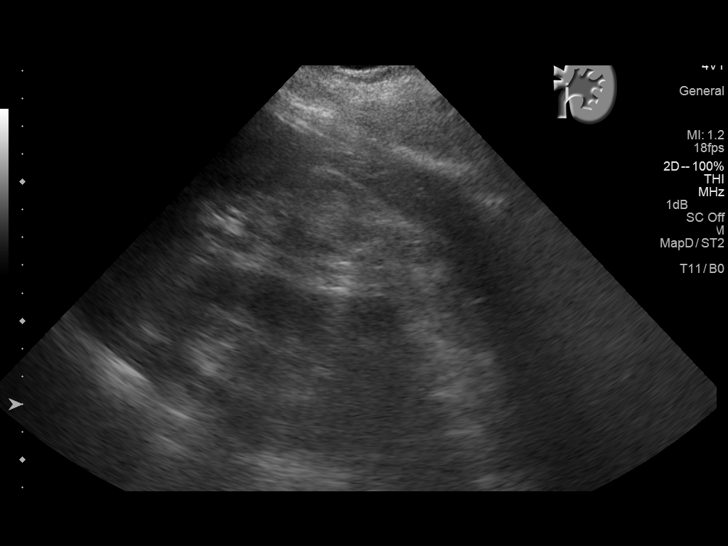
[im 36/43]
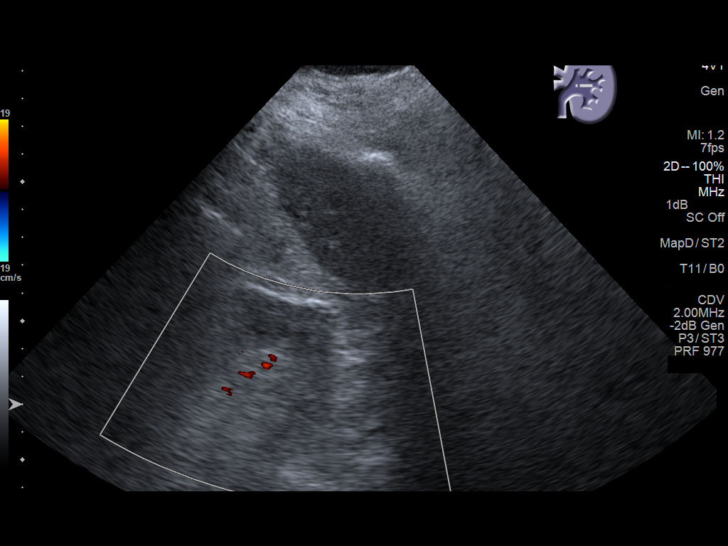
[im 39/43]
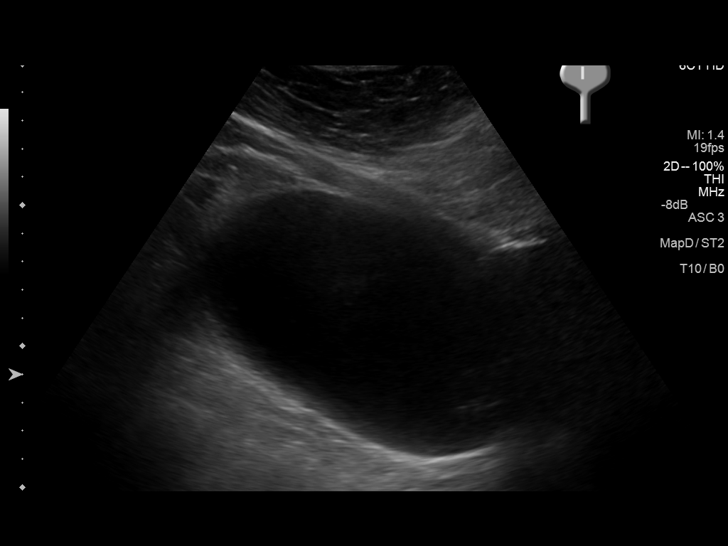
[im 43/43]
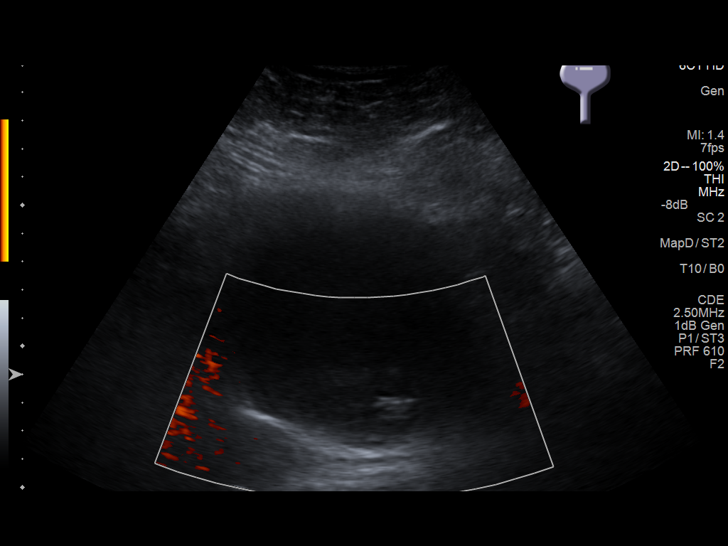

[14 of 25 positions shown; findings below may reference images not displayed]

FINDINGS: Right Kidney:

Length: 12.5 cm. Normal parenchymal echogenicity. Mild
hydronephrosis similar to the prior ultrasound and CT. No mass or
stone.

Left Kidney:

Length: 12.1 cm. Echogenicity within normal limits. No mass or
hydronephrosis visualized.

Bladder:

Foley catheter in place. Bladder still mildly distended. No bladder
mass or stone.
IMPRESSION: 1. Mild right hydronephrosis similar to the prior exams.
2. No left hydronephrosis.  No renal masses or stones.

## 2016-04-13 ENCOUNTER — Ambulatory Visit: Payer: Medicare HMO | Admitting: Urology

## 2016-06-30 ENCOUNTER — Telehealth (INDEPENDENT_AMBULATORY_CARE_PROVIDER_SITE_OTHER): Payer: Self-pay

## 2016-06-30 NOTE — Telephone Encounter (Signed)
Aldrin called wanting a verbal order for the patient to continue home health for two more weeks at twice weekly, this was given.

## 2016-06-30 NOTE — Telephone Encounter (Signed)
Alana from [facility] called stating per the patient she should be using the same type of unna boot as Dr. Lucky Cowboy does at the office. I let Alana know that we use Zinc Oxide unna boot and Coban, she stated that was what she was using.

## 2016-07-13 ENCOUNTER — Telehealth (INDEPENDENT_AMBULATORY_CARE_PROVIDER_SITE_OTHER): Payer: Self-pay

## 2016-07-13 ENCOUNTER — Ambulatory Visit (INDEPENDENT_AMBULATORY_CARE_PROVIDER_SITE_OTHER): Payer: Medicare HMO | Admitting: Vascular Surgery

## 2016-07-13 NOTE — Telephone Encounter (Signed)
Patient called wanting to know if she could come in this afternoon to have her leg wrapped, I spoke with the nurse that was at her home and the nurse stated she had wrapped her leg but she doesn't have supplies ordered and that will be taken care of tomorrow when the other nurse comes out.

## 2016-08-04 ENCOUNTER — Telehealth (INDEPENDENT_AMBULATORY_CARE_PROVIDER_SITE_OTHER): Payer: Self-pay

## 2016-08-04 NOTE — Telephone Encounter (Signed)
Aldrin from [facility] called wanting a verbal order to continue homecare. They will send over written orders also.

## 2016-08-08 ENCOUNTER — Telehealth (INDEPENDENT_AMBULATORY_CARE_PROVIDER_SITE_OTHER): Payer: Self-pay

## 2016-08-08 NOTE — Telephone Encounter (Signed)
Patient called wanting to know if the prescription for Bio-[employer] had been sent to them ? I explained that it had and they would make contact with her when insurance and PT was taken care of.

## 2016-08-16 ENCOUNTER — Telehealth (INDEPENDENT_AMBULATORY_CARE_PROVIDER_SITE_OTHER): Payer: Self-pay

## 2016-08-16 NOTE — Telephone Encounter (Signed)
Aquacell is fine.  If she is on Dilaudid, I am not sure adding Neurontin is needed.  She can try to change from Dilaudid to Neurontin, but whoever is prescribing her Dilaudid should do that.

## 2016-08-16 NOTE — Telephone Encounter (Signed)
Spoke with Drue Dun regarding what Dr. Lucky Cowboy said about the patient taking Neurontin and OT. See note below.

## 2016-08-16 NOTE — Telephone Encounter (Signed)
Kristie from [facility] called stating the patient is having phantom pains and wondered if she should be put on Neurotin. The patient takes Dilaudid and Aleve now, she also wanted to know since the patient has open sores can Aquacell be used on the open sores underneath the [employer].

## 2016-08-23 ENCOUNTER — Telehealth (INDEPENDENT_AMBULATORY_CARE_PROVIDER_SITE_OTHER): Payer: Self-pay

## 2016-08-23 NOTE — Telephone Encounter (Signed)
Occupational therapy called wanting a verbal order to give occupational therapy for 2 weeks, three times a week. She says that the patient is very weak in her upper body and shoulders

## 2016-08-23 NOTE — Telephone Encounter (Signed)
Two weeks of OT sounds like a good idea.  OK

## 2016-09-01 ENCOUNTER — Telehealth (INDEPENDENT_AMBULATORY_CARE_PROVIDER_SITE_OTHER): Payer: Self-pay

## 2016-09-01 NOTE — Telephone Encounter (Signed)
PT called stating that they would like to continue her PT for four more additional weeks, twice a week?

## 2016-10-14 ENCOUNTER — Telehealth (INDEPENDENT_AMBULATORY_CARE_PROVIDER_SITE_OTHER): Payer: Self-pay

## 2016-10-14 NOTE — Telephone Encounter (Signed)
Cristy from [facility] called stating the paitent has redness around the wounds on her legs and that she had taken off the [employer] and was using Kerlix instead with adaptic. She wanted to know if an antibiotic could be called in to the patient's pharmacy.

## 2016-10-14 NOTE — Telephone Encounter (Signed)
Attempted to call Cristy from [facility] and was unable to make contact, so I called the patient and let her know what to do and gave her instructions regarding her Unna boot and the antibiotic per Maudie Mercury.

## 2016-10-14 NOTE — Telephone Encounter (Signed)
I do not feel comfortable calling in an antibiotic without actually seeing a patient. An order was not given to stop the unna boots. I strongly recommend zinc oxide unna boots weekly with elevation heart level or higher. The nurse should see the patient over the weekend and if her skin continues to worsen she should be seen by urgent care or the ED.

## 2016-10-18 ENCOUNTER — Encounter (INDEPENDENT_AMBULATORY_CARE_PROVIDER_SITE_OTHER): Payer: Self-pay | Admitting: Vascular Surgery

## 2016-10-18 ENCOUNTER — Ambulatory Visit (INDEPENDENT_AMBULATORY_CARE_PROVIDER_SITE_OTHER): Payer: Medicare HMO | Admitting: Vascular Surgery

## 2016-10-18 VITALS — BP 143/73 | HR 91 | Resp 16 | Ht 67.0 in | Wt 232.0 lb

## 2016-10-18 DIAGNOSIS — L97202 Non-pressure chronic ulcer of unspecified calf with fat layer exposed: Secondary | ICD-10-CM | POA: Diagnosis not present

## 2016-10-18 DIAGNOSIS — E118 Type 2 diabetes mellitus with unspecified complications: Secondary | ICD-10-CM

## 2016-10-18 DIAGNOSIS — M7989 Other specified soft tissue disorders: Secondary | ICD-10-CM

## 2016-10-18 DIAGNOSIS — L97209 Non-pressure chronic ulcer of unspecified calf with unspecified severity: Secondary | ICD-10-CM | POA: Insufficient documentation

## 2016-10-18 NOTE — Assessment & Plan Note (Signed)
The patient has nonhealing ulcerations on both calf and ankle areas. I think this is likely multifactorial. We're going to do Unna boot dressings here at the office weekly. I have asked her to increase her exercise regimen and keep her legs elevated when she is not exercising. I think improvement in her glucose control and diet would be of great benefit and a referral to an endocrinologist will be performed. We will change the Unna boot weekly and I will see her back in about a month.

## 2016-10-18 NOTE — Assessment & Plan Note (Signed)
I believe she would benefit from an endocrinologist referral to try to optimize her glucose management. This may promote wound healing.

## 2016-10-18 NOTE — Progress Notes (Signed)
MRN : [id number]  April [name] is a 71 y.o. (06-16-1946) female who presents with chief complaint of  Chief Complaint  Patient presents with  . Follow-up  .  History of Present Illness: Patient returns today in follow up of LE wounds. She has been getting home health now for several weeks but the wounds have worsened. Her surgical incisions from her transmetatarsal amputations have all healed nicely. She has become more dependent and does not exercise much at all. She has also gained a fair bit of weight. She is not eating well and I am not sure how well controlled her sugars are. She does not have fevers or chills or any signs of systemic infection.  Current Outpatient Prescriptions  Medication Sig Dispense Refill  . acetaminophen (TYLENOL) 500 MG tablet Take 1,000 mg by mouth every 8 (eight) hours as needed for moderate pain. Reported on 12/16/2015    . aspirin EC 81 MG EC tablet Take 1 tablet (81 mg total) by mouth daily. 90 tablet 3  . docusate sodium (COLACE) 100 MG capsule Take 1 capsule (100 mg total) by mouth 2 (two) times daily. 10 capsule 0  . HYDROmorphone (DILAUDID) 2 MG tablet     . insulin glargine (LANTUS) 100 UNIT/ML injection Inject 0.15 mLs (15 Units total) into the skin daily. (Patient taking differently: Inject 15 Units into the skin daily. With Lunch) 10 mL 11  . metoprolol tartrate (LOPRESSOR) 25 MG tablet Take 1 tablet (25 mg total) by mouth 2 (two) times daily.    . tamsulosin (FLOMAX) 0.4 MG CAPS capsule Take 1 capsule (0.4 mg total) by mouth daily. 30 capsule   . traZODone (DESYREL) 50 MG tablet Take 50 mg by mouth at bedtime.    . diclofenac sodium (VOLTAREN) 1 % GEL Apply 4 g topically 4 (four) times daily.    . ondansetron (ZOFRAN) 4 MG tablet Take 1 tablet (4 mg total) by mouth every 6 (six) hours as needed for nausea. (Patient not taking: Reported on 03/03/2016) 20 tablet 0  . oxyCODONE (OXY IR/ROXICODONE) 5 MG immediate release tablet Take 1 tablet (5 mg total)  by mouth every 6 (six) hours as needed for moderate pain or severe pain. (Patient not taking: Reported on 10/18/2016) 30 tablet 0  . oxyCODONE (OXY IR/ROXICODONE) 5 MG immediate release tablet Take 1 tablet (5 mg total) by mouth every 6 (six) hours as needed for moderate pain or severe pain. (Patient not taking: Reported on 10/18/2016) 30 tablet 0  . polyethylene glycol (MIRALAX / GLYCOLAX) packet Take 17 g by mouth daily. (Patient not taking: Reported on 10/18/2016) 14 each 0   No current facility-administered medications for this visit.     Past Medical History:  Diagnosis Date  . Acute renal failure (ARF) ([place])   . Hypertension   . NSTEMI (non-ST elevated myocardial infarction) ([place])   . Paroxysmal atrial fibrillation (HCC)   . Renal disorder   . Sepsis [facility]) January 2017   [facility]  . Type 2 diabetes mellitus ([place])     Past Surgical History:  Procedure Laterality Date  . CYSTOSCOPY WITH STENT PLACEMENT Right 11/03/2015   Procedure: CYSTOSCOPY WITH STENT PLACEMENT;  Surgeon: Hollice Espy, MD;  Location: [facility] ORS;  Service: Urology;  Laterality: Right;  . FRACTURE SURGERY Right    Elbow  . TONSILLECTOMY    . 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 Smoking status: Never Smoker  . Smokeless tobacco: Never Used  . Alcohol use No     Family History Family History  Problem Relation Age of Onset  . Diabetes Father   . Heart disease Father   . Stroke Maternal Grandmother      Allergies  Allergen Reactions  . Prednisone Anaphylaxis     REVIEW OF SYSTEMS (Negative unless checked)  Constitutional: '[]'$ Weight loss  '[]'$ Fever  '[]'$ Chills Cardiac: '[]'$ Chest pain   '[]'$ Chest pressure   '[]'$ Palpitations   '[]'$ Shortness of breath when laying flat   '[]'$ Shortness of breath at rest   '[]'$ Shortness of breath  with exertion. Vascular:  '[]'$ Pain in legs with walking   '[]'$ Pain in legs at rest   '[]'$ Pain in legs when laying flat   '[]'$ Claudication   '[]'$ Pain in feet when walking  '[]'$ Pain in feet at rest  '[]'$ Pain in feet when laying flat   '[]'$ History of DVT   '[]'$ Phlebitis   '[x]'$ Swelling in legs   '[]'$ Varicose veins   '[x]'$ Non-healing ulcers Pulmonary:   '[]'$ Uses home oxygen   '[]'$ Productive cough   '[]'$ Hemoptysis   '[]'$ Wheeze  '[]'$ COPD   '[]'$ Asthma Neurologic:  '[]'$ Dizziness  '[]'$ Blackouts   '[]'$ Seizures   '[]'$ History of stroke   '[]'$ History of TIA  '[]'$ Aphasia   '[]'$ Temporary blindness   '[]'$ Dysphagia   '[]'$ Weakness or numbness in arms   '[]'$ Weakness or numbness in legs Musculoskeletal:  '[]'$ Arthritis   '[]'$ Joint swelling   '[]'$ Joint pain   '[]'$ Low back pain Hematologic:  '[]'$ Easy bruising  '[]'$ Easy bleeding   '[]'$ Hypercoagulable state   '[]'$ Anemic   Gastrointestinal:  '[]'$ Blood in stool   '[]'$ Vomiting blood  '[]'$ Gastroesophageal reflux/heartburn   '[]'$ Abdominal pain Genitourinary:  '[]'$ Chronic kidney disease   '[]'$ Difficult urination  '[]'$ Frequent urination  '[]'$ Burning with urination   '[]'$ Hematuria Skin:  '[]'$ Rashes   '[x]'$ Ulcers   '[x]'$ Wounds Psychological:  '[]'$ History of anxiety   '[]'$  History of major depression.  Physical Examination  BP (!) 143/73   Pulse 91   Resp 16   Ht '5\' 7"'$  (1.702 m)   Wt 232 lb (105.2 kg)   BMI 36.34 kg/m  Gen:  WD/WN, NAD Head: [place]/AT, No temporalis wasting. Ear/Nose/Throat: Hearing grossly intact, nares w/o erythema or drainage, trachea midline Eyes: Conjunctiva clear. Sclera non-icteric Neck: Supple.  No JVD.  Pulmonary:  Good air movement, no use of accessory muscles.  Cardiac: RRR, normal S1, S2 Vascular:  Vessel Right Left  Radial Palpable Palpable  Ulnar Palpable Palpable  Brachial Palpable Palpable  Carotid Palpable, without bruit Palpable, without bruit  Aorta Not palpable N/A  Femoral Palpable Palpable  Popliteal Palpable Palpable  PT Palpable Palpable  DP Palpable Palpable   Gastrointestinal: soft, non-tender/non-distended. No  guarding/reflex.  Musculoskeletal: M/S 5/5 throughout.  No deformity or atrophy. Wounds as described below. Trace edema bilaterally. Neurologic: Sensation grossly intact in extremities.  Symmetrical.  Speech is fluent.  Psychiatric: Judgment intact, Mood & affect appropriate for pt's clinical situation. Dermatologic: Multiple shallow wounds throughout both lower extremities in the calf and ankle area. Dark scabs are present. No erythema or signs of infection. Transmetatarsal amputation incisions both well-healed. Lymph : No Cervical, Axillary, or Inguinal lymphadenopathy.      Labs No results found for this or any previous visit (from the past 2160 hour(s)).  Radiology No results found.    Assessment/Plan  Type 2 diabetes mellitus ([place]) I believe she would benefit from an endocrinologist referral  to try to optimize her glucose management. This may promote wound healing.  Swelling of limb The swelling is actually reasonably well controlled, but given the continued ulceration maintaining compression is of upmost importance.  Ulcer of calf ([place]) The patient has nonhealing ulcerations on both calf and ankle areas. I think this is likely multifactorial. We're going to do Unna boot dressings here at the office weekly. I have asked her to increase her exercise regimen and keep her legs elevated when she is not exercising. I think improvement in her glucose control and diet would be of great benefit and a referral to an endocrinologist will be performed. We will change the Unna boot weekly and I will see her back in about a month.    Leotis Pain, MD  10/18/2016 3:32 PM    This note was created with Dragon medical transcription system.  Any errors from dictation are purely unintentional

## 2016-10-18 NOTE — Assessment & Plan Note (Signed)
The swelling is actually reasonably well controlled, but given the continued ulceration maintaining compression is of upmost importance.

## 2016-10-20 ENCOUNTER — Ambulatory Visit (INDEPENDENT_AMBULATORY_CARE_PROVIDER_SITE_OTHER): Payer: Self-pay | Admitting: Vascular Surgery

## 2016-10-24 ENCOUNTER — Ambulatory Visit (INDEPENDENT_AMBULATORY_CARE_PROVIDER_SITE_OTHER): Payer: Self-pay | Admitting: Vascular Surgery

## 2016-10-25 ENCOUNTER — Ambulatory Visit (INDEPENDENT_AMBULATORY_CARE_PROVIDER_SITE_OTHER): Payer: Medicare HMO | Admitting: Vascular Surgery

## 2016-10-25 ENCOUNTER — Encounter (INDEPENDENT_AMBULATORY_CARE_PROVIDER_SITE_OTHER): Payer: Self-pay

## 2016-10-25 ENCOUNTER — Ambulatory Visit (INDEPENDENT_AMBULATORY_CARE_PROVIDER_SITE_OTHER): Payer: Self-pay | Admitting: Vascular Surgery

## 2016-10-25 VITALS — BP 152/84 | HR 77 | Resp 16

## 2016-10-25 DIAGNOSIS — L97202 Non-pressure chronic ulcer of unspecified calf with fat layer exposed: Secondary | ICD-10-CM

## 2016-10-25 DIAGNOSIS — M7989 Other specified soft tissue disorders: Secondary | ICD-10-CM | POA: Diagnosis not present

## 2016-10-25 NOTE — Progress Notes (Signed)
History of Present Illness  There is no documented history at this time  Assessments & Plan   There are no diagnoses linked to this encounter.    Additional instructions  Subjective:  Patient presents with venous ulcer of the Bilateral lower extremity.    Procedure:  3 layer unna wrap was placed Bilateral lower extremity.   Plan:   Follow up in one week.  

## 2016-10-30 IMAGING — CT CT HEAD W/O CM
2 series · 16 of 30 positions shown, 20 images · non-contrast
Comparison: 10/20/2015

CLINICAL DATA: Found lying on the floor minimally responsive

EXAM:
CT HEAD WITHOUT CONTRAST
TECHNIQUE: Contiguous axial images were obtained from the base of the skull
through the vertex without intravenous contrast.

[Series 2: head wo · axial · 0.42mm/px · z∈[+274,+404]mm · 13 of 32 slices shown, 17 images]
[im 3/32  brain]
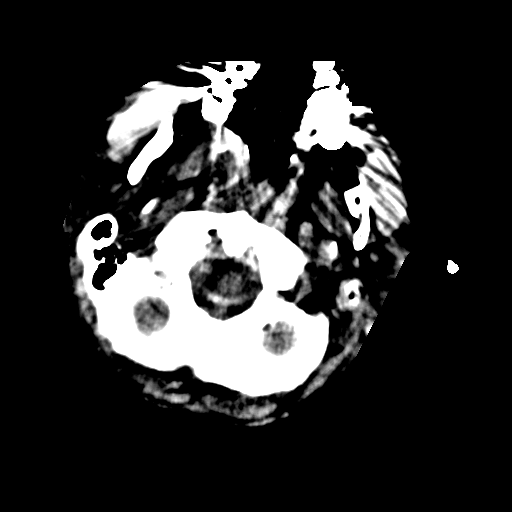
[im 3/32  bone]
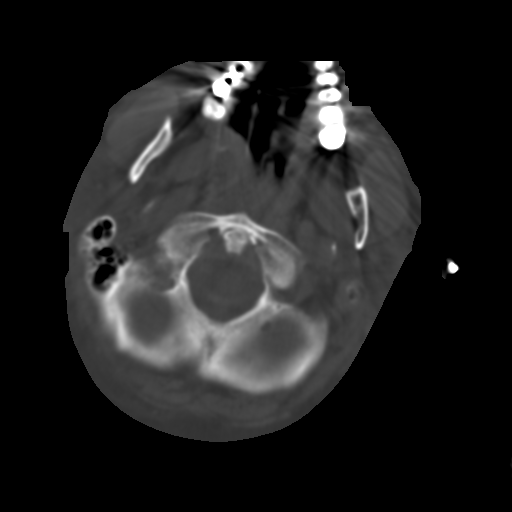
[im 5/32  brain]
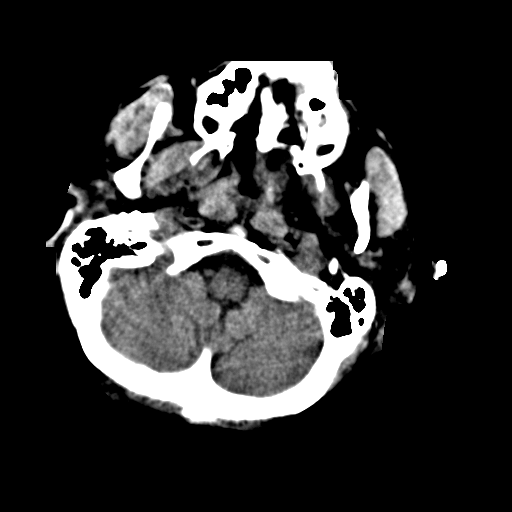
[im 7/32  brain]
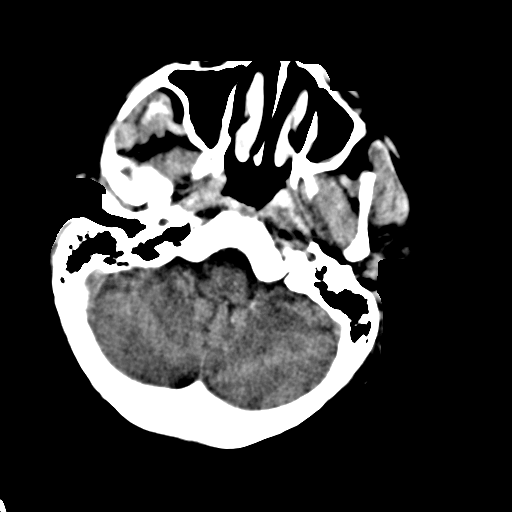
[im 9/32  brain]
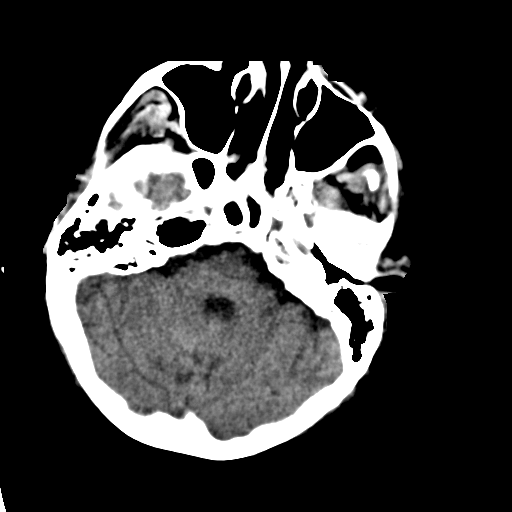
[im 12/32  brain]
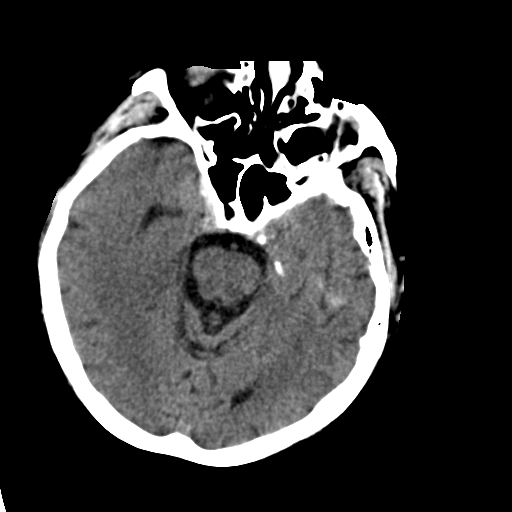
[im 12/32  bone]
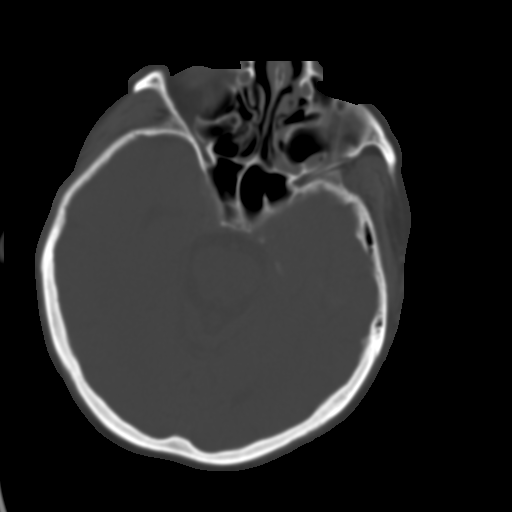
[im 14/32  brain]
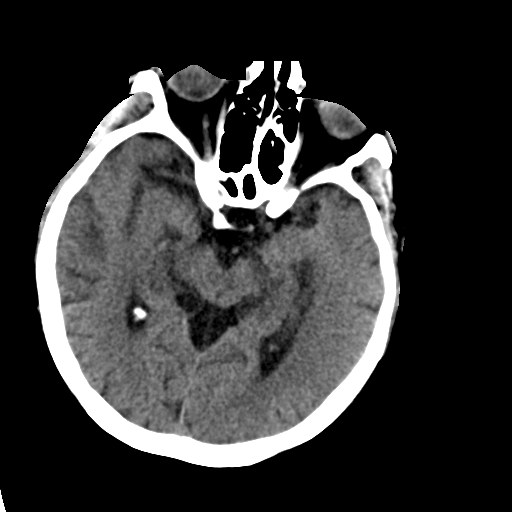
[im 16/32  brain]
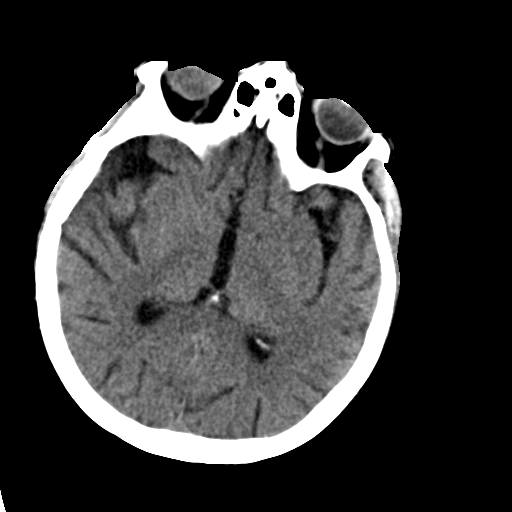
[im 18/32  brain]
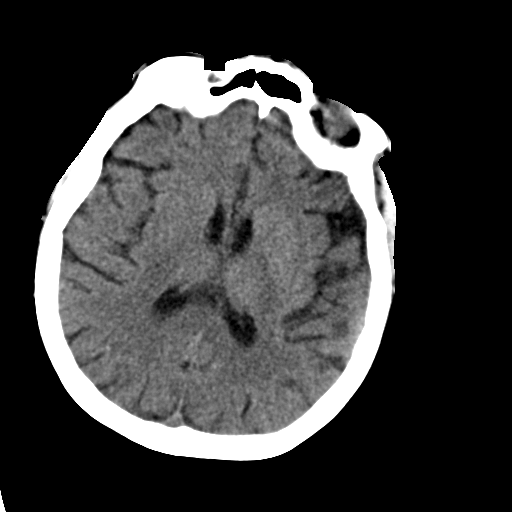
[im 20/32  brain]
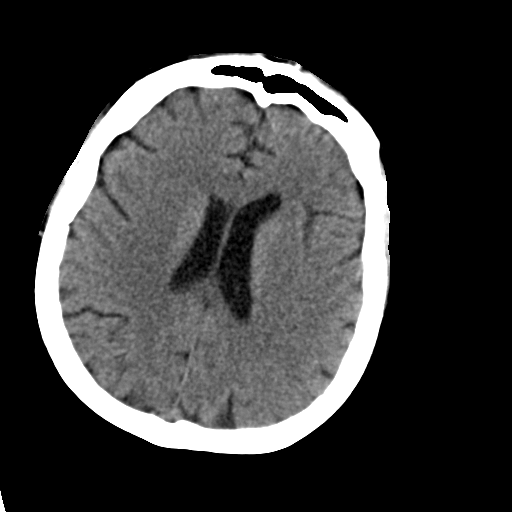
[im 20/32  bone]
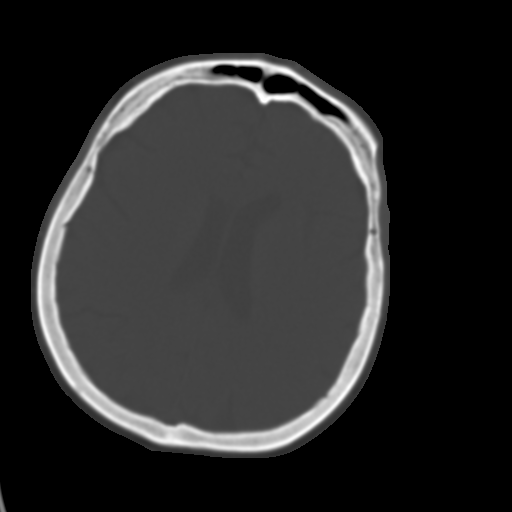
[im 23/32  brain]
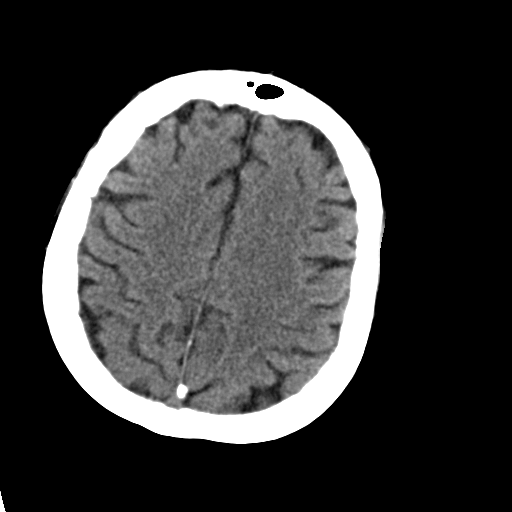
[im 25/32  brain]
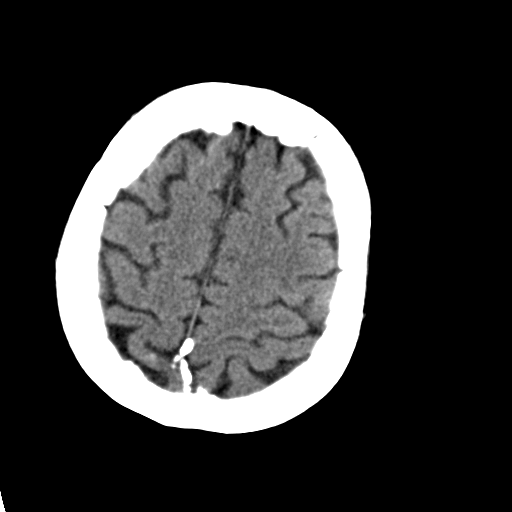
[im 27/32  brain]
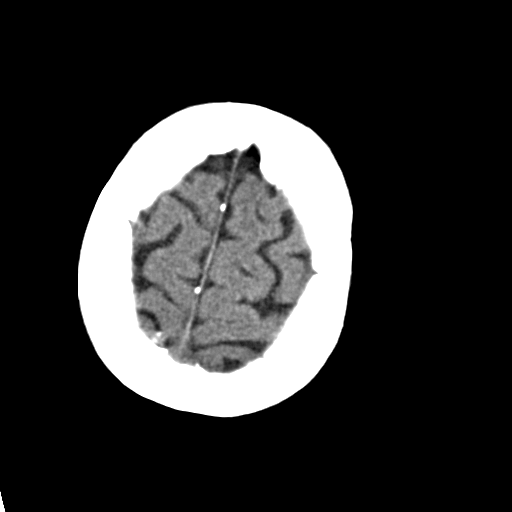
[im 29/32  brain]
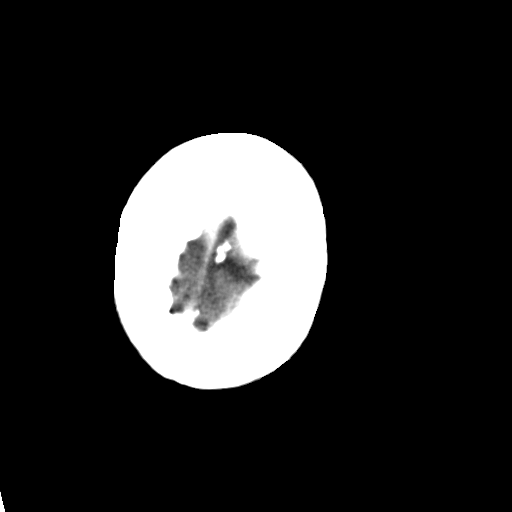
[im 29/32  bone]
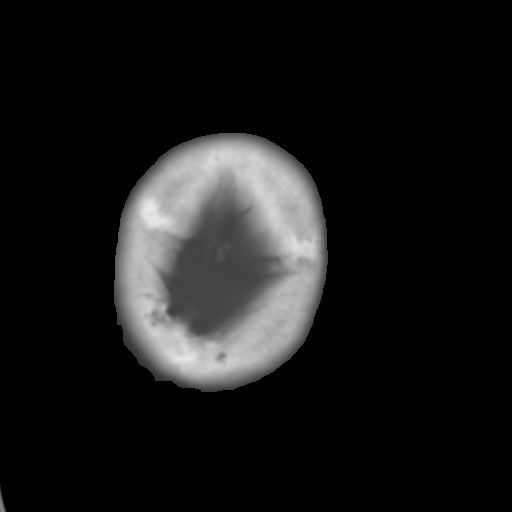

[Series 4: head wo recon · axial · 0.39mm/px · z∈[+300,+344]mm · 3 of 33 slices shown]
[im 3/33  brain]
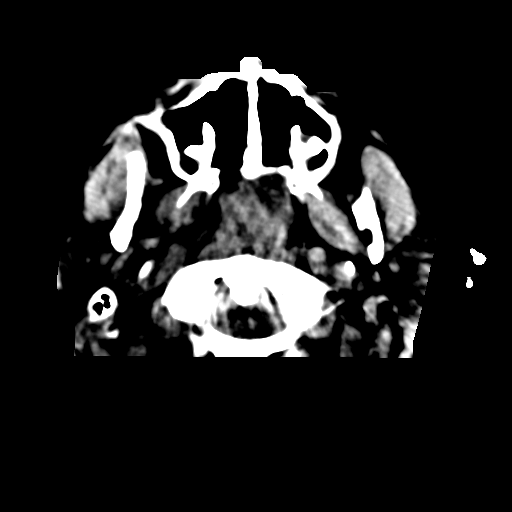
[im 7/33  brain]
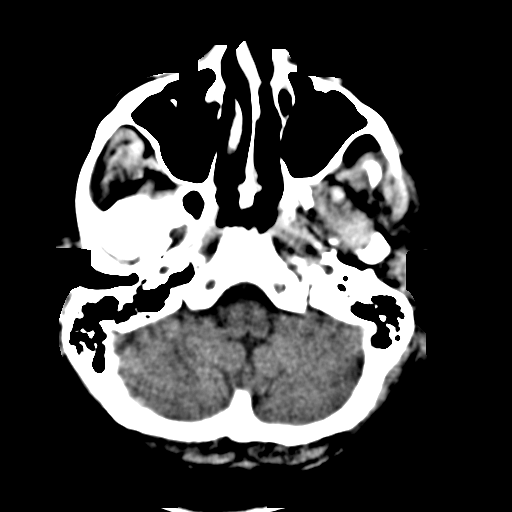
[im 12/33  brain]
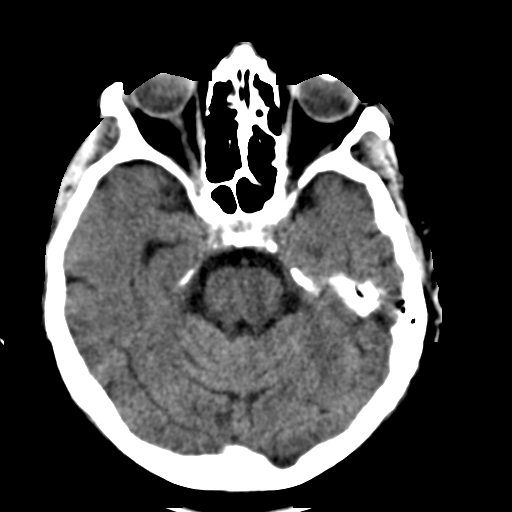

[16 of 30 positions shown; findings below may reference images not displayed]

FINDINGS: No acute cortical infarct, hemorrhage, or mass lesion ispresent.
Ventricles are of normal size. No significant extra-axial fluid
collection is present. The paranasal sinuses andmastoid air cells
are clear. The osseous skull is intact. ET tube is identified. There
is mild mucosal thickening involving the left maxillary sinus. The
mastoid air cells are clear. The calvarium is intact.
IMPRESSION: 1. No acute intracranial abnormalities identified.

## 2016-10-31 IMAGING — CR DG CHEST 1V
1 series · 1 of 1 positions shown · non-contrast
Comparison: Portable chest x-ray October 26, 2015

CLINICAL DATA: Dyspnea, sepsis, acute renal failure, respiratory
failure, intubated patient.

EXAM:
CHEST 1 VIEW

[ap]
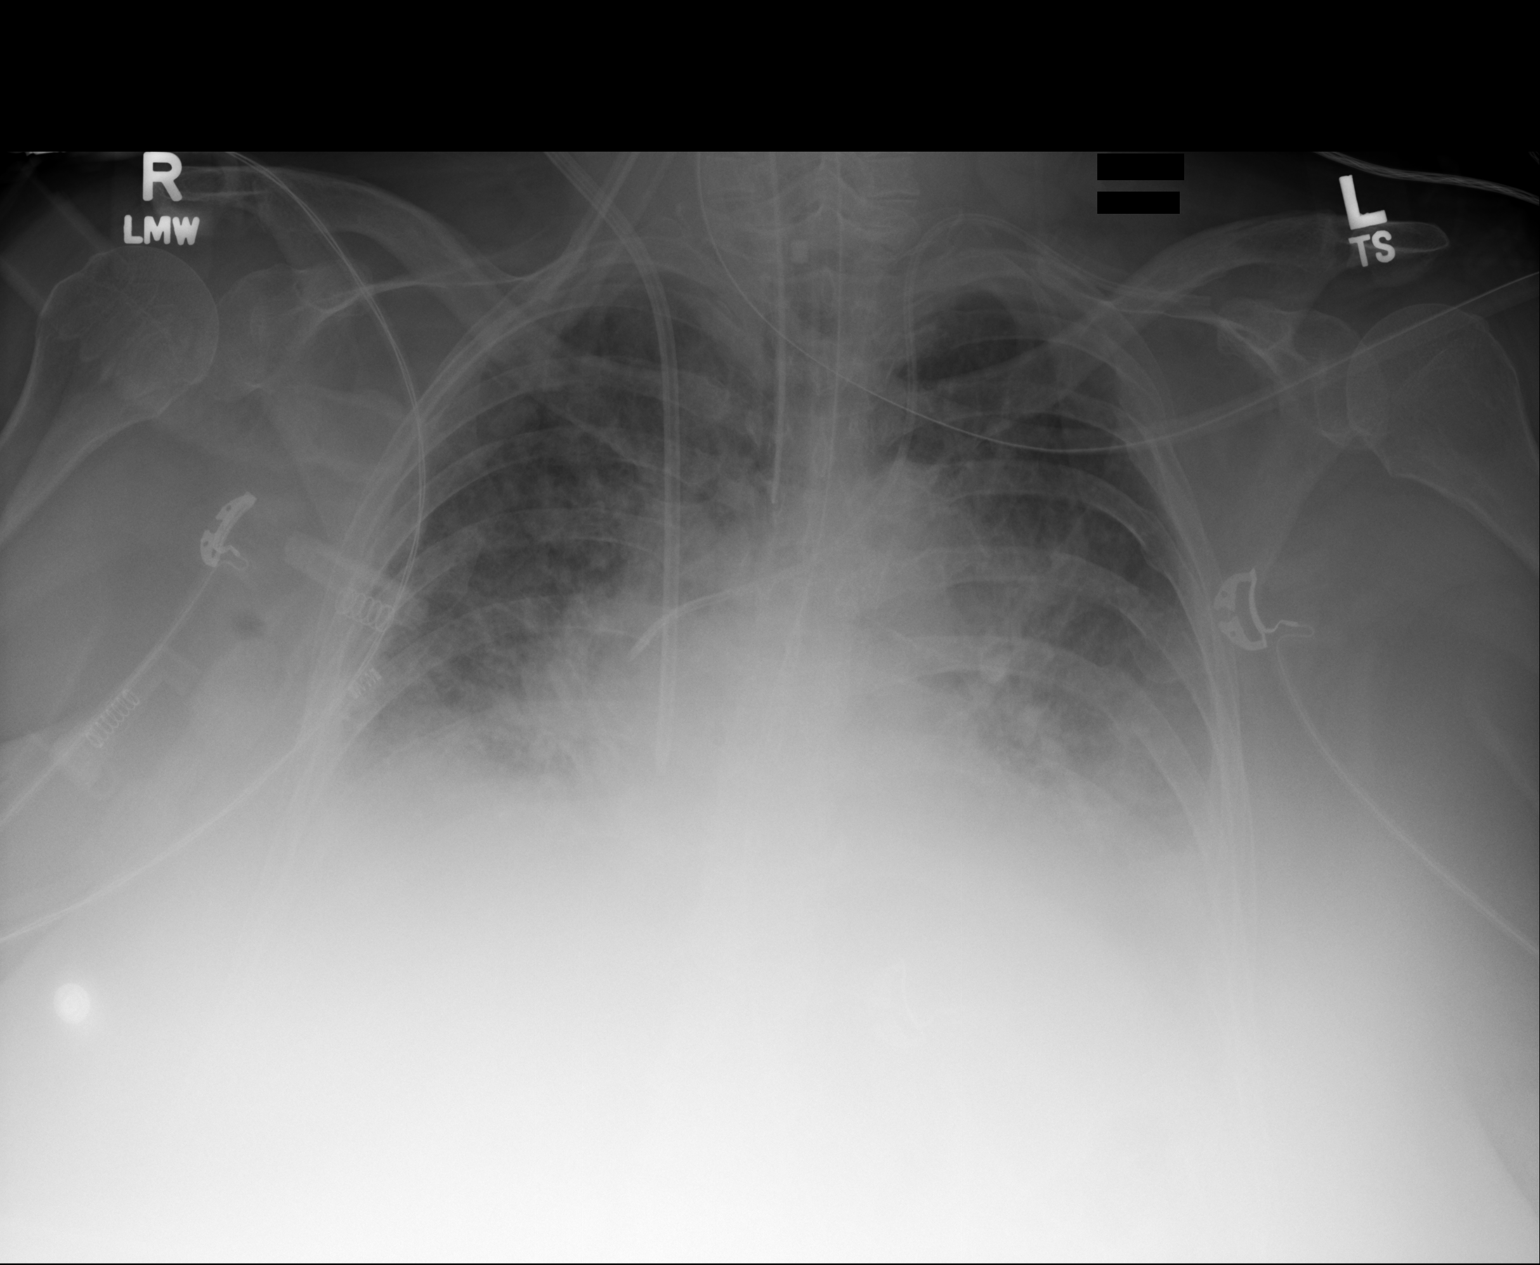

[1 of 1 positions shown; findings below may reference images not displayed]

FINDINGS: The lungs are mildly hypoinflated today. The pulmonary interstitial
markings remain increased bilaterally. The hemidiaphragms are now
obscured bilaterally. The cardiac silhouette is poorly defined but
appears mildly enlarged. The central pulmonary vascularity is
prominent. There are probable bilateral pleural effusions. The
endotracheal tube tip lies approximately 3 cm above the carina. The
right internal jugular venous catheter tip projects over the
midportion of the SVC. The left internal jugular venous catheter tip
projects over the proximal SVC. The esophagogastric tube tip
projects below the inferior margin of the image.
IMPRESSION: Worsening of pulmonary interstitial edema and bilateral pleural
effusions. This is likely secondary to CHF though bilateral
interstitial pneumonia could produce similar findings.

## 2016-11-01 ENCOUNTER — Ambulatory Visit (INDEPENDENT_AMBULATORY_CARE_PROVIDER_SITE_OTHER): Payer: Medicare HMO | Admitting: Vascular Surgery

## 2016-11-01 ENCOUNTER — Encounter (INDEPENDENT_AMBULATORY_CARE_PROVIDER_SITE_OTHER): Payer: Self-pay

## 2016-11-01 VITALS — BP 138/82 | HR 72 | Resp 16

## 2016-11-01 DIAGNOSIS — L97202 Non-pressure chronic ulcer of unspecified calf with fat layer exposed: Secondary | ICD-10-CM | POA: Diagnosis not present

## 2016-11-01 DIAGNOSIS — M7989 Other specified soft tissue disorders: Secondary | ICD-10-CM

## 2016-11-01 NOTE — Progress Notes (Signed)
History of Present Illness  There is no documented history at this time  Assessments & Plan   There are no diagnoses linked to this encounter.    Additional instructions  Subjective:  Patient presents with venous ulcer of the Bilateral lower extremity.    Procedure:  3 layer unna wrap was placed Bilateral lower extremity.   Plan:   Follow up in one week.  

## 2016-11-03 IMAGING — CR DG CHEST 1V
1 series · 1 of 1 positions shown · non-contrast
Comparison: 10/30/2015

CLINICAL DATA: Dyspnea.

EXAM:
CHEST 1 VIEW

[portable]
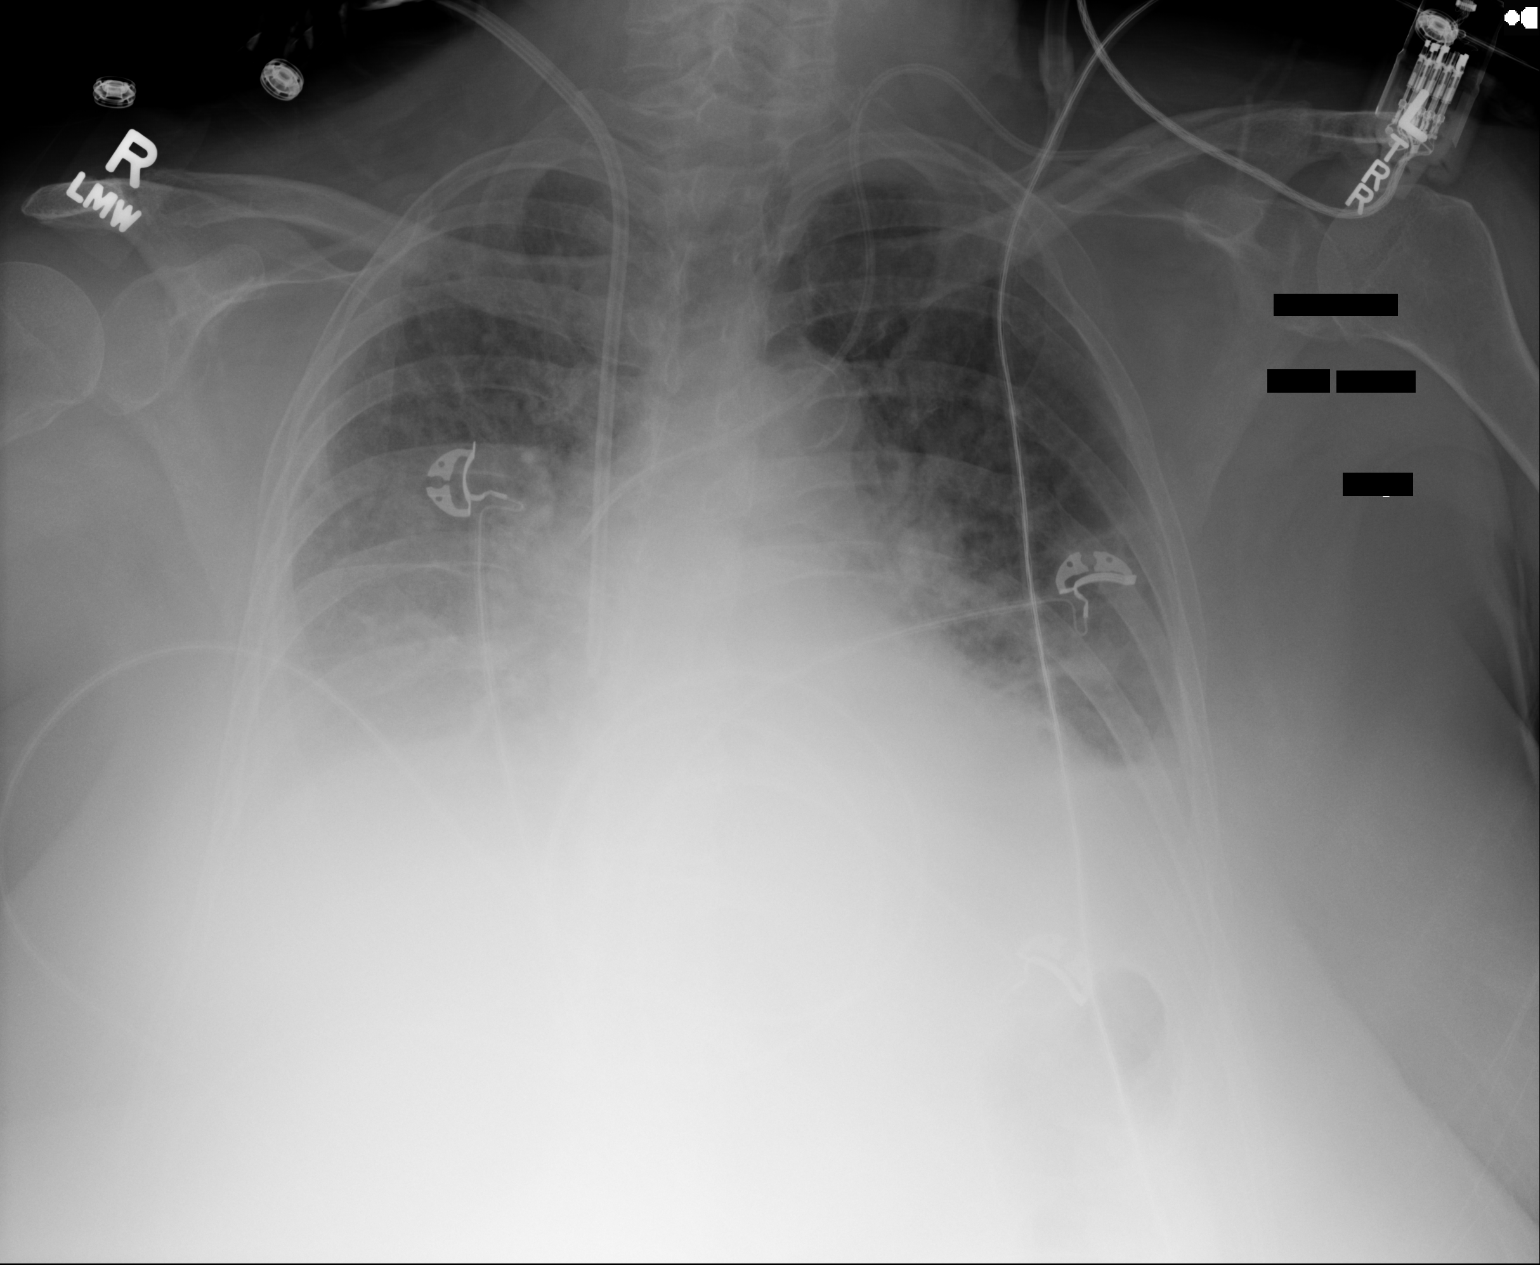

[1 of 1 positions shown; findings below may reference images not displayed]

FINDINGS: Right jugular central venous catheter terminates over the lower SVC/
cavoatrial junction, unchanged. Left jugular catheter terminates
over the mid SVC. Cardiac silhouette is partially obscured but
grossly unchanged. Thoracic aortic calcification is noted. Lung
volumes remain diminished with pulmonary vascular congestion the and
mild interstitial edema. Bibasilar opacities are unchanged and
likely reflect a combination of atelectasis and small pleural
effusions. Old left rib fractures.
IMPRESSION: Pulmonary edema, bibasilar atelectasis, and small bilateral pleural
effusions. No significant interval change.

## 2016-11-07 IMAGING — CR DG CHEST 1V PORT
1 series · 1 of 1 positions shown · non-contrast
Comparison: Chest x-ray dated 11/01/2015.

CLINICAL DATA: PICC line insertion.

EXAM:
PORTABLE CHEST 1 VIEW

[ap]
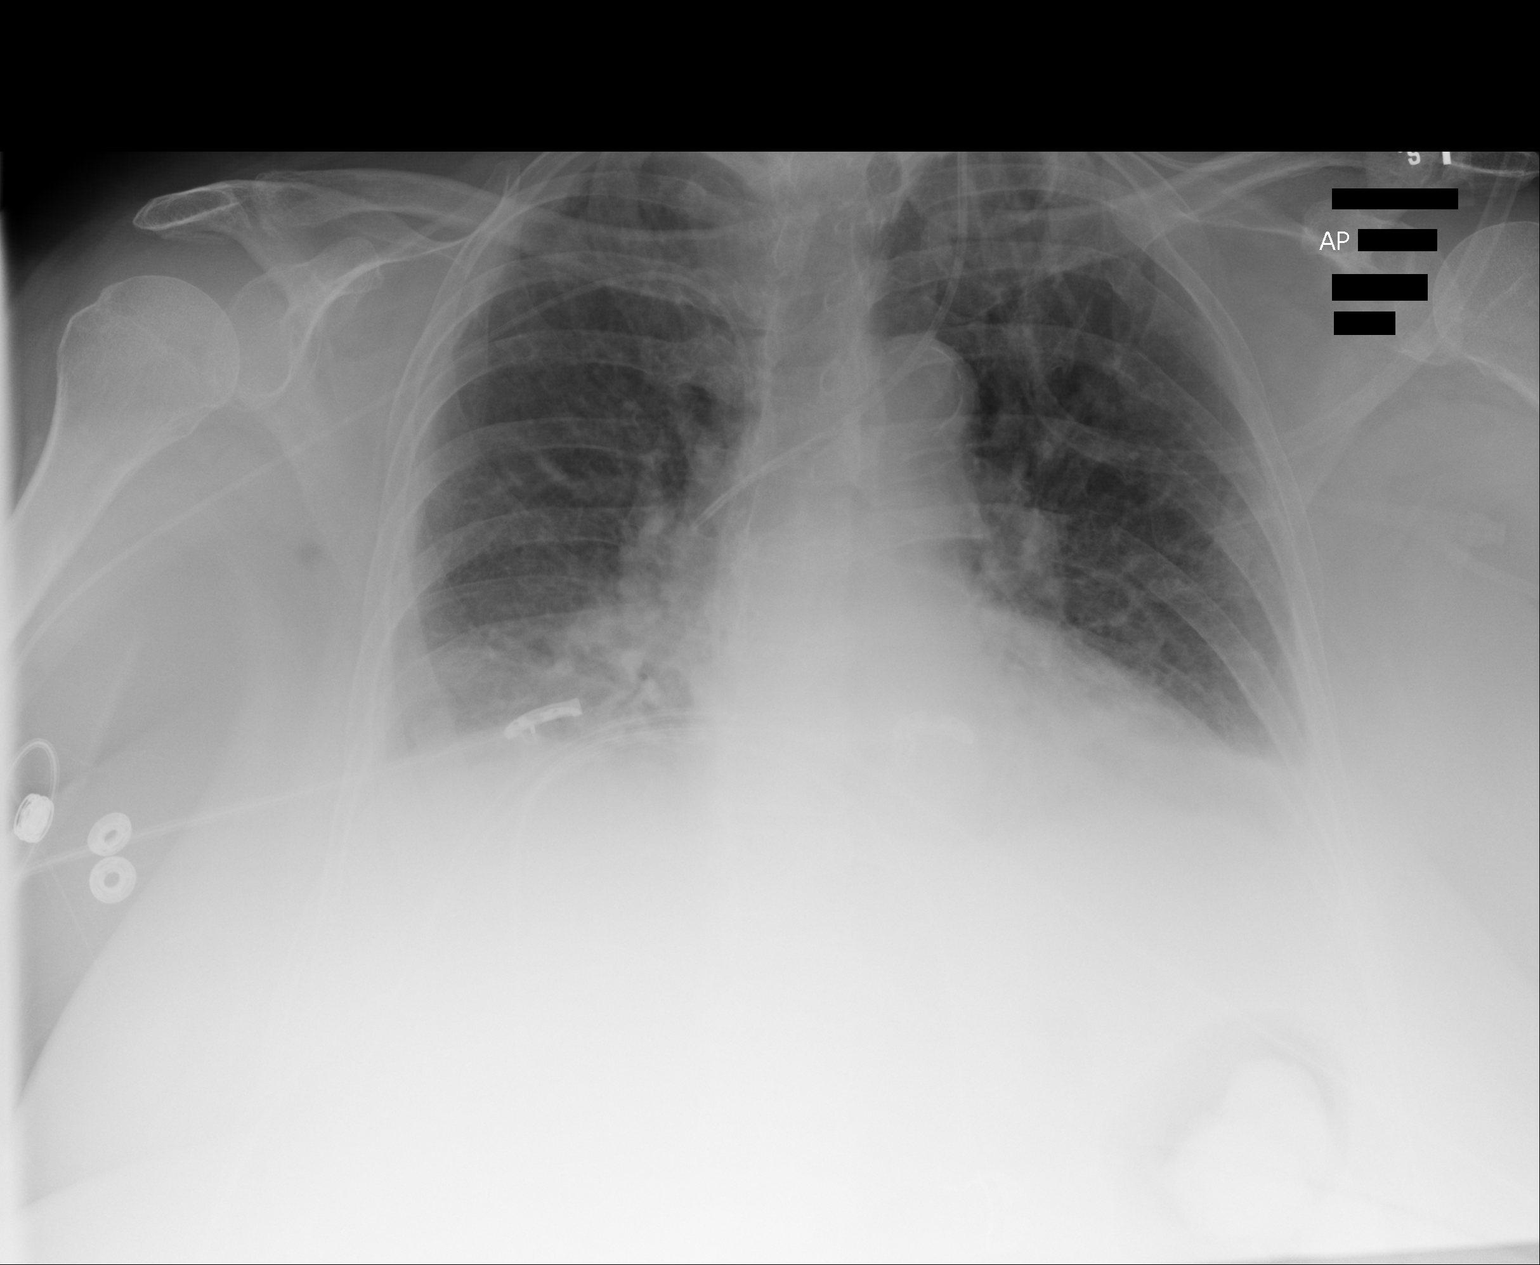

[1 of 1 positions shown; findings below may reference images not displayed]

FINDINGS: Left-sided central line in place with tip stable in position at the
upper margin of the SVC. New right-sided PICC line now in place with
tip well-positioned in the lower SVC.

Cardiomediastinal silhouette is stable in size and configuration.
Central pulmonary vascular congestion and bilateral interstitial
edema persists, but decreased compared to the previous exam.
Opacities at each lung base most likely representing a combination
of small pleural effusions and atelectasis.
IMPRESSION: 1. Right-sided PICC line placement with tip well-positioned in the
lower SVC, near the expected level of the cavoatrial junction.
2. Left-sided central line stable in position with tip at the upper
margin of the SVC.
3. Persistent central pulmonary vascular congestion and bilateral
interstitial edema, but improved compared to the previous exam
suggesting improved fluid status.
4. Probable small bilateral pleural effusions and bibasilar
atelectasis.

## 2016-11-08 ENCOUNTER — Ambulatory Visit (INDEPENDENT_AMBULATORY_CARE_PROVIDER_SITE_OTHER): Payer: Medicare HMO | Admitting: Vascular Surgery

## 2016-11-08 ENCOUNTER — Encounter (INDEPENDENT_AMBULATORY_CARE_PROVIDER_SITE_OTHER): Payer: Self-pay

## 2016-11-08 VITALS — BP 131/72 | HR 76 | Resp 16

## 2016-11-08 DIAGNOSIS — M7989 Other specified soft tissue disorders: Secondary | ICD-10-CM | POA: Diagnosis not present

## 2016-11-08 DIAGNOSIS — L97222 Non-pressure chronic ulcer of left calf with fat layer exposed: Secondary | ICD-10-CM | POA: Diagnosis not present

## 2016-11-08 DIAGNOSIS — L97212 Non-pressure chronic ulcer of right calf with fat layer exposed: Secondary | ICD-10-CM

## 2016-11-08 DIAGNOSIS — L97202 Non-pressure chronic ulcer of unspecified calf with fat layer exposed: Secondary | ICD-10-CM

## 2016-11-08 NOTE — Progress Notes (Signed)
History of Present Illness  There is no documented history at this time  Assessments & Plan   There are no diagnoses linked to this encounter.    Additional instructions  Subjective:  Patient presents with venous ulcer of Bilateral lower extremities.    Procedure:  3 layer unna wrap was placed on Bilateral lower extremities.   Plan:   Follow up in one week. 

## 2016-11-15 ENCOUNTER — Encounter (INDEPENDENT_AMBULATORY_CARE_PROVIDER_SITE_OTHER): Payer: Self-pay

## 2016-11-15 ENCOUNTER — Ambulatory Visit (INDEPENDENT_AMBULATORY_CARE_PROVIDER_SITE_OTHER): Payer: Medicare HMO | Admitting: Vascular Surgery

## 2016-11-15 VITALS — BP 132/78 | HR 78 | Resp 16

## 2016-11-15 DIAGNOSIS — M7989 Other specified soft tissue disorders: Secondary | ICD-10-CM | POA: Diagnosis not present

## 2016-11-15 NOTE — Progress Notes (Signed)
History of Present Illness  There is no documented history at this time  Assessments & Plan   There are no diagnoses linked to this encounter.    Additional instructions  Subjective:  Patient presents with venous ulcer of the Bilateral lower extremity.    Procedure:  3 layer unna wrap was placed Bilateral lower extremity.   Plan:   Follow up in one week.  

## 2016-11-22 ENCOUNTER — Ambulatory Visit (INDEPENDENT_AMBULATORY_CARE_PROVIDER_SITE_OTHER): Payer: Medicare HMO | Admitting: Vascular Surgery

## 2016-11-22 ENCOUNTER — Encounter (INDEPENDENT_AMBULATORY_CARE_PROVIDER_SITE_OTHER): Payer: Self-pay | Admitting: Vascular Surgery

## 2016-11-22 VITALS — BP 144/75 | HR 72 | Resp 16 | Ht 67.0 in | Wt 234.0 lb

## 2016-11-22 DIAGNOSIS — L97222 Non-pressure chronic ulcer of left calf with fat layer exposed: Secondary | ICD-10-CM | POA: Diagnosis not present

## 2016-11-22 DIAGNOSIS — E118 Type 2 diabetes mellitus with unspecified complications: Secondary | ICD-10-CM | POA: Diagnosis not present

## 2016-11-22 DIAGNOSIS — M7989 Other specified soft tissue disorders: Secondary | ICD-10-CM

## 2016-11-22 DIAGNOSIS — L97212 Non-pressure chronic ulcer of right calf with fat layer exposed: Secondary | ICD-10-CM | POA: Diagnosis not present

## 2016-11-22 DIAGNOSIS — L97202 Non-pressure chronic ulcer of unspecified calf with fat layer exposed: Secondary | ICD-10-CM

## 2016-11-22 NOTE — Progress Notes (Signed)
MRN : [id number]  April [name] is a 71 y.o. (03-15-1946) female who presents with chief complaint of  Chief Complaint  Patient presents with  . Ulcer    Unna boot check  .  History of Present Illness: Patient returns today in follow up of Lower extremity swelling and ulceration. She has been in [employer] now for about a month with good results. Her ulcers are markedly better. Her swelling is very mild this point. She looks well today and actually wants to go back to work which I think is reasonable.   Current Outpatient Prescriptions  Medication Sig Dispense Refill  . acetaminophen (TYLENOL) 500 MG tablet Take 1,000 mg by mouth every 8 (eight) hours as needed for moderate pain. Reported on 12/16/2015    . aspirin EC 81 MG EC tablet Take 1 tablet (81 mg total) by mouth daily. 90 tablet 3  . docusate sodium (COLACE) 100 MG capsule Take 1 capsule (100 mg total) by mouth 2 (two) times daily. 10 capsule 0  . HYDROmorphone (DILAUDID) 2 MG tablet     . insulin glargine (LANTUS) 100 UNIT/ML injection Inject 0.15 mLs (15 Units total) into the skin daily. (Patient taking differently: Inject 15 Units into the skin daily. With Lunch) 10 mL 11  . metoprolol tartrate (LOPRESSOR) 25 MG tablet Take 1 tablet (25 mg total) by mouth 2 (two) times daily.    . tamsulosin (FLOMAX) 0.4 MG CAPS capsule Take 1 capsule (0.4 mg total) by mouth daily. 30 capsule   . traZODone (DESYREL) 50 MG tablet Take 50 mg by mouth at bedtime.    . diclofenac sodium (VOLTAREN) 1 % GEL Apply 4 g topically 4 (four) times daily.    . ondansetron (ZOFRAN) 4 MG tablet Take 1 tablet (4 mg total) by mouth every 6 (six) hours as needed for nausea. (Patient not taking: Reported on 03/03/2016) 20 tablet 0  . oxyCODONE (OXY IR/ROXICODONE) 5 MG immediate release tablet Take 1 tablet (5 mg total) by mouth every 6 (six) hours as needed for moderate pain or severe pain. (Patient not taking: Reported on 10/18/2016) 30 tablet 0  .  oxyCODONE (OXY IR/ROXICODONE) 5 MG immediate release tablet Take 1 tablet (5 mg total) by mouth every 6 (six) hours as needed for moderate pain or severe pain. (Patient not taking: Reported on 10/18/2016) 30 tablet 0  . polyethylene glycol (MIRALAX / GLYCOLAX) packet Take 17 g by mouth daily. (Patient not taking: Reported on 10/18/2016) 14 each 0   No current facility-administered medications for this visit.         Past Medical History:  Diagnosis Date  . Acute renal failure (ARF) ([place])   . Hypertension   . NSTEMI (non-ST elevated myocardial infarction) ([place])   . Paroxysmal atrial fibrillation (HCC)   . Renal disorder   . Sepsis [facility]) January 2017   [facility]  . Type 2 diabetes mellitus ([place])          Past Surgical History:  Procedure Laterality Date  . CYSTOSCOPY WITH STENT PLACEMENT Right 11/03/2015   Procedure: CYSTOSCOPY WITH STENT PLACEMENT;  Surgeon: Hollice Espy, MD;  Location: [facility] ORS;  Service: Urology;  Laterality: Right;  . FRACTURE SURGERY Right    Elbow  . TONSILLECTOMY    . 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 Smoking status: Never Smoker  . Smokeless tobacco: Never Used  . Alcohol use No     Family History Family History  Problem Relation Age of Onset  . Diabetes Father   . Heart disease Father   . Stroke Maternal Grandmother          Allergies  Allergen Reactions  . Prednisone Anaphylaxis     REVIEW OF SYSTEMS (Negative unless checked)  Constitutional: '[]'$ Weight loss  '[]'$ Fever  '[]'$ Chills Cardiac: '[]'$ Chest pain   '[]'$ Chest pressure   '[]'$ Palpitations   '[]'$ Shortness of breath when laying flat   '[]'$ Shortness of breath at rest   '[]'$ Shortness of breath with exertion. Vascular:  '[]'$ Pain in legs with walking   '[]'$ Pain in legs at  rest   '[]'$ Pain in legs when laying flat   '[]'$ Claudication   '[]'$ Pain in feet when walking  '[]'$ Pain in feet at rest  '[]'$ Pain in feet when laying flat   '[]'$ History of DVT   '[]'$ Phlebitis   '[x]'$ Swelling in legs   '[]'$ Varicose veins   '[x]'$ Non-healing ulcers Pulmonary:   '[]'$ Uses home oxygen   '[]'$ Productive cough   '[]'$ Hemoptysis   '[]'$ Wheeze  '[]'$ COPD   '[]'$ Asthma Neurologic:  '[]'$ Dizziness  '[]'$ Blackouts   '[]'$ Seizures   '[]'$ History of stroke   '[]'$ History of TIA  '[]'$ Aphasia   '[]'$ Temporary blindness   '[]'$ Dysphagia   '[]'$ Weakness or numbness in arms   '[]'$ Weakness or numbness in legs Musculoskeletal:  '[]'$ Arthritis   '[]'$ Joint swelling   '[]'$ Joint pain   '[]'$ Low back pain Hematologic:  '[]'$ Easy bruising  '[]'$ Easy bleeding   '[]'$ Hypercoagulable state   '[]'$ Anemic   Gastrointestinal:  '[]'$ Blood in stool   '[]'$ Vomiting blood  '[]'$ Gastroesophageal reflux/heartburn   '[]'$ Abdominal pain Genitourinary:  '[]'$ Chronic kidney disease   '[]'$ Difficult urination  '[]'$ Frequent urination  '[]'$ Burning with urination   '[]'$ Hematuria Skin:  '[]'$ Rashes   '[x]'$ Ulcers   '[x]'$ Wounds Psychological:  '[]'$ History of anxiety   '[]'$  History of major depression.  Physical Examination  BP (!) 143/73   Pulse 91   Resp 16   Ht '5\' 7"'$  (1.702 m)   Wt 232 lb (105.2 kg)   BMI 36.34 kg/m  Gen:  WD/WN, NAD Head: [place]/AT, No temporalis wasting. Ear/Nose/Throat: Hearing grossly intact, nares w/o erythema or drainage, trachea midline Eyes: Conjunctiva clear. Sclera non-icteric Neck: Supple.  No JVD.  Pulmonary:  Good air movement, no use of accessory muscles.  Cardiac: RRR, normal S1, S2 Vascular:  Vessel Right Left  Radial Palpable Palpable  Ulnar Palpable Palpable  Brachial Palpable Palpable  Carotid Palpable, without bruit Palpable, without bruit  Aorta Not palpable N/A  Femoral Palpable Palpable  Popliteal Palpable Palpable  PT Palpable Palpable  DP Palpable Palpable   Gastrointestinal: soft, non-tender/non-distended. No guarding/reflex.  Musculoskeletal: M/S 5/5 throughout.  No deformity or atrophy.  Wounds as described below. Trace edema bilaterally. Neurologic: Sensation grossly intact in extremities.  Symmetrical.  Speech is fluent.  Psychiatric: Judgment intact, Mood & affect appropriate for pt's clinical situation. Dermatologic: Right leg with only one small residual ulcer with scab. Left leg has 1 in the upper calf, one in the lower calf, and one around the ankle but both have shown significant improvement over the last several weeks with Unna boots. No erythema or purulent drainage. Transmetatarsal amputation incisions both well-healed. Lymph : No Cervical, Axillary, or Inguinal lymphadenopathy.     Labs No results found for this or any previous visit (from the past 2160 hour(s)).  Radiology No results found.  Assessment/Plan  Swelling of limb Controlled with Unna boots. Once her wounds heal, we'll need to wear compression stockings.  Type 2 diabetes mellitus (HCC) blood glucose control important in reducing the progression of atherosclerotic disease. Also, involved in wound healing. On appropriate medications.   Ulcer of calf (HCC) Improving with Unna boots. Continue Unna boot therapy and 3 layer wraps were placed bilaterally today. Change these weekly. Recheck her in the office in about a month.    Leotis Pain, MD  11/22/2016 2:07 PM    This note was created with Dragon medical transcription system.  Any errors from dictation are purely unintentional

## 2016-11-22 NOTE — Assessment & Plan Note (Signed)
blood glucose control important in reducing the progression of atherosclerotic disease. Also, involved in wound healing. On appropriate medications.  

## 2016-11-22 NOTE — Assessment & Plan Note (Signed)
Improving with Unna boots. Continue Unna boot therapy and 3 layer wraps were placed bilaterally today. Change these weekly. Recheck her in the office in about a month.

## 2016-11-22 NOTE — Assessment & Plan Note (Signed)
Controlled with Unna boots. Once her wounds heal, we'll need to wear compression stockings.

## 2016-11-29 ENCOUNTER — Encounter (INDEPENDENT_AMBULATORY_CARE_PROVIDER_SITE_OTHER): Payer: Self-pay | Admitting: Vascular Surgery

## 2016-11-29 ENCOUNTER — Ambulatory Visit (INDEPENDENT_AMBULATORY_CARE_PROVIDER_SITE_OTHER): Payer: Medicare HMO | Admitting: Vascular Surgery

## 2016-11-29 VITALS — BP 140/80 | HR 71 | Resp 16

## 2016-11-29 DIAGNOSIS — L97212 Non-pressure chronic ulcer of right calf with fat layer exposed: Secondary | ICD-10-CM

## 2016-11-29 DIAGNOSIS — L97222 Non-pressure chronic ulcer of left calf with fat layer exposed: Secondary | ICD-10-CM

## 2016-11-29 DIAGNOSIS — M7989 Other specified soft tissue disorders: Secondary | ICD-10-CM

## 2016-11-29 DIAGNOSIS — L97202 Non-pressure chronic ulcer of unspecified calf with fat layer exposed: Secondary | ICD-10-CM

## 2016-11-29 NOTE — Progress Notes (Signed)
History of Present Illness  There is no documented history at this time  Assessments & Plan   There are no diagnoses linked to this encounter.    Additional instructions  Subjective:  Patient presents with venous ulcer of the Bilateral lower extremity.    Procedure:  3 layer unna wrap was placed Bilateral lower extremity.   Plan:   Follow up in one week.  

## 2016-12-01 ENCOUNTER — Telehealth (INDEPENDENT_AMBULATORY_CARE_PROVIDER_SITE_OTHER): Payer: Self-pay

## 2016-12-01 NOTE — Telephone Encounter (Signed)
#  [phone] is the correct fax number to where the order should go

## 2016-12-01 NOTE — Telephone Encounter (Signed)
[facility], needs the order for the wedge that you want April [name] to have. [phone] number)

## 2016-12-06 ENCOUNTER — Encounter (INDEPENDENT_AMBULATORY_CARE_PROVIDER_SITE_OTHER): Payer: Self-pay

## 2016-12-06 ENCOUNTER — Ambulatory Visit (INDEPENDENT_AMBULATORY_CARE_PROVIDER_SITE_OTHER): Payer: Medicare HMO | Admitting: Vascular Surgery

## 2016-12-06 VITALS — BP 136/78 | HR 84 | Resp 16

## 2016-12-06 DIAGNOSIS — L97222 Non-pressure chronic ulcer of left calf with fat layer exposed: Secondary | ICD-10-CM

## 2016-12-06 DIAGNOSIS — L97202 Non-pressure chronic ulcer of unspecified calf with fat layer exposed: Secondary | ICD-10-CM

## 2016-12-06 DIAGNOSIS — L97212 Non-pressure chronic ulcer of right calf with fat layer exposed: Secondary | ICD-10-CM

## 2016-12-06 DIAGNOSIS — M7989 Other specified soft tissue disorders: Secondary | ICD-10-CM

## 2016-12-06 NOTE — Progress Notes (Signed)
History of Present Illness  There is no documented history at this time  Assessments & Plan   There are no diagnoses linked to this encounter.    Additional instructions  Subjective:  Patient presents with venous ulcer of the Bilateral lower extremity.    Procedure:  3 layer unna wrap was placed Bilateral lower extremity.   Plan:   Follow up in one week.  

## 2016-12-13 ENCOUNTER — Ambulatory Visit (INDEPENDENT_AMBULATORY_CARE_PROVIDER_SITE_OTHER): Payer: Medicare HMO | Admitting: Vascular Surgery

## 2016-12-13 ENCOUNTER — Encounter (INDEPENDENT_AMBULATORY_CARE_PROVIDER_SITE_OTHER): Payer: Self-pay

## 2016-12-13 VITALS — BP 131/78 | HR 77 | Resp 17

## 2016-12-13 DIAGNOSIS — L97222 Non-pressure chronic ulcer of left calf with fat layer exposed: Secondary | ICD-10-CM | POA: Diagnosis not present

## 2016-12-13 DIAGNOSIS — L97212 Non-pressure chronic ulcer of right calf with fat layer exposed: Secondary | ICD-10-CM

## 2016-12-13 DIAGNOSIS — L97202 Non-pressure chronic ulcer of unspecified calf with fat layer exposed: Secondary | ICD-10-CM

## 2016-12-13 NOTE — Progress Notes (Signed)
History of Present Illness  There is no documented history at this time  Assessments & Plan   There are no diagnoses linked to this encounter.    Additional instructions  Subjective:  Patient presents with venous ulcer of the Bilateral lower extremity.    Procedure:  3 layer unna wrap was placed Bilateral lower extremity.   Plan:   Follow up in one week.  

## 2016-12-14 ENCOUNTER — Other Ambulatory Visit (INDEPENDENT_AMBULATORY_CARE_PROVIDER_SITE_OTHER): Payer: Self-pay

## 2016-12-20 ENCOUNTER — Ambulatory Visit (INDEPENDENT_AMBULATORY_CARE_PROVIDER_SITE_OTHER): Payer: Medicare HMO | Admitting: Vascular Surgery

## 2016-12-20 ENCOUNTER — Encounter (INDEPENDENT_AMBULATORY_CARE_PROVIDER_SITE_OTHER): Payer: Self-pay | Admitting: Vascular Surgery

## 2016-12-20 VITALS — BP 147/81 | HR 77 | Resp 17 | Wt 240.0 lb

## 2016-12-20 DIAGNOSIS — E118 Type 2 diabetes mellitus with unspecified complications: Secondary | ICD-10-CM

## 2016-12-20 DIAGNOSIS — M7989 Other specified soft tissue disorders: Secondary | ICD-10-CM | POA: Diagnosis not present

## 2016-12-20 NOTE — Progress Notes (Signed)
MRN : [id number]  April [name] is a 71 y.o. (1946/01/21) female who presents with chief complaint of  Chief Complaint  Patient presents with  . Follow-up    unna check  .  History of Present Illness: Patient returns today in follow up of Leg swelling and ulceration for Unna boot check. Her ulcers have now healed and her swelling is basically gone. Her legs look great. The skin is reasonably fresh, and she does not yet have compression stockings. She has no fever or chills.  Current Outpatient Prescriptions  Medication Sig Dispense Refill  . acetaminophen (TYLENOL) 500 MG tablet Take 1,000 mg by mouth every 8 (eight) hours as needed for moderate pain. Reported on 12/16/2015    . aspirin EC 81 MG EC tablet Take 1 tablet (81 mg total) by mouth daily. 90 tablet 3  . diclofenac sodium (VOLTAREN) 1 % GEL Apply 4 g topically 4 (four) times daily.    Marland Kitchen docusate sodium (COLACE) 100 MG capsule Take 1 capsule (100 mg total) by mouth 2 (two) times daily. 10 capsule 0  . HYDROmorphone (DILAUDID) 2 MG tablet     . insulin glargine (LANTUS) 100 UNIT/ML injection Inject 0.15 mLs (15 Units total) into the skin daily. (Patient taking differently: Inject 15 Units into the skin daily. With Lunch) 10 mL 11  . metoprolol tartrate (LOPRESSOR) 25 MG tablet Take 1 tablet (25 mg total) by mouth 2 (two) times daily.    . ondansetron (ZOFRAN) 4 MG tablet Take 1 tablet (4 mg total) by mouth every 6 (six) hours as needed for nausea. 20 tablet 0  . oxyCODONE (OXY IR/ROXICODONE) 5 MG immediate release tablet Take 1 tablet (5 mg total) by mouth every 6 (six) hours as needed for moderate pain or severe pain. 30 tablet 0  . polyethylene glycol (MIRALAX / GLYCOLAX) packet Take 17 g by mouth daily. 14 each 0  . pravastatin (PRAVACHOL) 20 MG tablet     . tamsulosin (FLOMAX) 0.4 MG CAPS capsule Take 1 capsule (0.4 mg total) by mouth daily. 30 capsule   . traMADol (ULTRAM) 50 MG tablet     . traZODone (DESYREL) 50 MG tablet  Take 50 mg by mouth at bedtime.     No current facility-administered medications for this visit.     Past Medical History:  Diagnosis Date  . Acute renal failure (ARF) ([place])   . Hypertension   . NSTEMI (non-ST elevated myocardial infarction) ([place])   . Paroxysmal atrial fibrillation (HCC)   . Renal disorder   . Sepsis [facility]) January 2017   [facility]  . Type 2 diabetes mellitus ([place])     Past Surgical History:  Procedure Laterality Date  . CYSTOSCOPY WITH STENT PLACEMENT Right 11/03/2015   Procedure: CYSTOSCOPY WITH STENT PLACEMENT;  Surgeon: Hollice Espy, MD;  Location: [facility] ORS;  Service: Urology;  Laterality: Right;  . FRACTURE SURGERY Right    Elbow  . TONSILLECTOMY    . 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 Smoking status: Never Smoker  . Smokeless tobacco: Never Used  . Alcohol use No    Family History Family History  Problem Relation Age of Onset  . Diabetes Father   . Heart disease Father   . Stroke Maternal Grandmother     Allergies  Allergen  Reactions  . Prednisone Anaphylaxis     REVIEW OF SYSTEMS (Negative unless checked)  Constitutional: '[]'$ Weight loss  '[]'$ Fever  '[]'$ Chills Cardiac: '[]'$ Chest pain   '[]'$ Chest pressure   '[]'$ Palpitations   '[]'$ Shortness of breath when laying flat   '[]'$ Shortness of breath at rest   '[]'$ Shortness of breath with exertion. Vascular:  '[]'$ Pain in legs with walking   '[]'$ Pain in legs at rest   '[]'$ Pain in legs when laying flat   '[]'$ Claudication   '[]'$ Pain in feet when walking  '[]'$ Pain in feet at rest  '[]'$ Pain in feet when laying flat   '[]'$ History of DVT   '[]'$ Phlebitis   '[x]'$ Swelling in legs   '[]'$ Varicose veins   '[]'$ Non-healing ulcers Pulmonary:   '[]'$ Uses home oxygen   '[]'$ Productive cough   '[]'$ Hemoptysis   '[]'$ Wheeze  '[]'$ COPD   '[]'$ Asthma Neurologic:  '[]'$ Dizziness   '[]'$ Blackouts   '[]'$ Seizures   '[]'$ History of stroke   '[]'$ History of TIA  '[]'$ Aphasia   '[]'$ Temporary blindness   '[]'$ Dysphagia   '[]'$ Weakness or numbness in arms   '[]'$ Weakness or numbness in legs Musculoskeletal:  '[]'$ Arthritis   '[]'$ Joint swelling   '[]'$ Joint pain   '[]'$ Low back pain Hematologic:  '[]'$ Easy bruising  '[]'$ Easy bleeding   '[]'$ Hypercoagulable state   '[]'$ Anemic   Gastrointestinal:  '[]'$ Blood in stool   '[]'$ Vomiting blood  '[]'$ Gastroesophageal reflux/heartburn   '[]'$ Abdominal pain Genitourinary:  '[]'$ Chronic kidney disease   '[]'$ Difficult urination  '[]'$ Frequent urination  '[]'$ Burning with urination   '[]'$ Hematuria Skin:  '[]'$ Rashes   '[x]'$ Ulcers   '[x]'$ Wounds Psychological:  '[]'$ History of anxiety   '[]'$  History of major depression.  Physical Examination  BP (!) 147/81   Pulse 77   Resp 17   Wt 240 lb (108.9 kg)   BMI 37.59 kg/m  Gen:  WD/WN, NAD Head: [place]/AT, No temporalis wasting. Ear/Nose/Throat: Hearing grossly intact, nares w/o erythema or drainage, trachea midline Eyes: Conjunctiva clear. Sclera non-icteric Neck: Supple.  No JVD.  Pulmonary:  Good air movement, no use of accessory muscles.  Cardiac: RRR, normal S1, S2 Vascular:  Vessel Right Left  Radial Palpable Palpable  Ulnar Palpable Palpable  Brachial Palpable Palpable  Carotid Palpable, without bruit Palpable, without bruit  Aorta Not palpable N/A  Femoral Palpable Palpable  Popliteal Palpable Palpable  PT Palpable Palpable  DP Palpable Palpable   Gastrointestinal: soft, non-tender/non-distended. No guarding/reflex.  Musculoskeletal: M/S 5/5 throughout.  Stasis changes present but wounds have healed.  Neurologic: Sensation grossly intact in extremities.  Symmetrical.  Speech is fluent.  Psychiatric: Judgment intact, Mood & affect appropriate for pt's clinical situation. Dermatologic: previous ulcers have healed Lymph : No Cervical, Axillary, or Inguinal lymphadenopathy.      Labs No results found for this or any previous visit (from the past 2160  hour(s)).  Radiology No results found.    Assessment/Plan  Swelling of limb The skin is reasonably fresh, and she does not yet have compression stockings. For this reason, a 3 layer Unna boot was placed on both lower extremities today. This can come off next week and she will go into compression stockings. I will see her back in 3 months.  Type 2 diabetes mellitus (HCC) blood glucose control important in reducing the progression of atherosclerotic disease. Also, involved in wound healing. On appropriate medications.     Leotis Pain, MD  12/20/2016 4:39 PM    This note was created with Dragon medical transcription system.  Any errors from dictation are purely unintentional

## 2016-12-20 NOTE — Assessment & Plan Note (Signed)
blood glucose control important in reducing the progression of atherosclerotic disease. Also, involved in wound healing. On appropriate medications.  

## 2016-12-20 NOTE — Assessment & Plan Note (Signed)
The skin is reasonably fresh, and she does not yet have compression stockings. For this reason, a 3 layer Unna boot was placed on both lower extremities today. This can come off next week and she will go into compression stockings. I will see her back in 3 months.

## 2016-12-27 ENCOUNTER — Encounter (INDEPENDENT_AMBULATORY_CARE_PROVIDER_SITE_OTHER): Payer: Medicare HMO

## 2017-03-21 ENCOUNTER — Encounter (INDEPENDENT_AMBULATORY_CARE_PROVIDER_SITE_OTHER): Payer: Self-pay | Admitting: Vascular Surgery

## 2017-03-21 ENCOUNTER — Ambulatory Visit (INDEPENDENT_AMBULATORY_CARE_PROVIDER_SITE_OTHER): Payer: Medicare HMO | Admitting: Vascular Surgery

## 2017-03-21 VITALS — BP 139/78 | HR 74 | Resp 16 | Ht 67.0 in | Wt 247.0 lb

## 2017-03-21 DIAGNOSIS — M79604 Pain in right leg: Secondary | ICD-10-CM

## 2017-03-21 DIAGNOSIS — E118 Type 2 diabetes mellitus with unspecified complications: Secondary | ICD-10-CM

## 2017-03-21 DIAGNOSIS — M7989 Other specified soft tissue disorders: Secondary | ICD-10-CM | POA: Diagnosis not present

## 2017-03-21 DIAGNOSIS — M79609 Pain in unspecified limb: Secondary | ICD-10-CM | POA: Insufficient documentation

## 2017-03-21 DIAGNOSIS — L97202 Non-pressure chronic ulcer of unspecified calf with fat layer exposed: Secondary | ICD-10-CM

## 2017-03-21 DIAGNOSIS — M79605 Pain in left leg: Secondary | ICD-10-CM

## 2017-03-21 NOTE — Assessment & Plan Note (Signed)
Now healed 

## 2017-03-21 NOTE — Progress Notes (Signed)
MRN : [id number]  April [name] is a 71 y.o. (Oct 31, 1945) female who presents with chief complaint of  Chief Complaint  Patient presents with  . Re-evaluation    3 month no studies  .  History of Present Illness: Patient returns today in follow up of Leg swelling, previous ulcerations, and bilateral transmetatarsal amputations. Her skin is all intact today and her leg swelling is pretty minimal. She has been using diabetic socks and not compression stockings as her swelling has been minimal. She is much more ambulatory and is now driving and resuming more normal activities after a prolonged recovery from her severe illness last year. She has no fevers or chills.  Current Outpatient Prescriptions  Medication Sig Dispense Refill  . acetaminophen (TYLENOL) 500 MG tablet Take 1,000 mg by mouth every 8 (eight) hours as needed for moderate pain. Reported on 12/16/2015    . aspirin EC 81 MG EC tablet Take 1 tablet (81 mg total) by mouth daily. 90 tablet 3  . diclofenac sodium (VOLTAREN) 1 % GEL Apply 4 g topically 4 (four) times daily.    Marland Kitchen docusate sodium (COLACE) 100 MG capsule Take 1 capsule (100 mg total) by mouth 2 (two) times daily. 10 capsule 0  . HYDROmorphone (DILAUDID) 2 MG tablet     . insulin glargine (LANTUS) 100 UNIT/ML injection Inject 0.15 mLs (15 Units total) into the skin daily. (Patient taking differently: Inject 15 Units into the skin daily. With Lunch) 10 mL 11  . metoprolol tartrate (LOPRESSOR) 25 MG tablet Take 1 tablet (25 mg total) by mouth 2 (two) times daily.    . ondansetron (ZOFRAN) 4 MG tablet Take 1 tablet (4 mg total) by mouth every 6 (six) hours as needed for nausea. 20 tablet 0  . oxyCODONE (OXY IR/ROXICODONE) 5 MG immediate release tablet Take 1 tablet (5 mg total) by mouth every 6 (six) hours as needed for moderate pain or severe pain. 30 tablet 0  . polyethylene glycol (MIRALAX / GLYCOLAX) packet Take 17 g by mouth daily. 14 each 0  . pravastatin  (PRAVACHOL) 20 MG tablet     . tamsulosin (FLOMAX) 0.4 MG CAPS capsule Take 1 capsule (0.4 mg total) by mouth daily. 30 capsule   . traMADol (ULTRAM) 50 MG tablet     . traZODone (DESYREL) 50 MG tablet Take 50 mg by mouth at bedtime.     No current facility-administered medications for this visit.         Past Medical History:  Diagnosis Date  . Acute renal failure (ARF) ([place])   . Hypertension   . NSTEMI (non-ST elevated myocardial infarction) ([place])   . Paroxysmal atrial fibrillation (HCC)   . Renal disorder   . Sepsis [facility]) January 2017   [facility]  . Type 2 diabetes mellitus ([place])          Past Surgical History:  Procedure Laterality Date  . CYSTOSCOPY WITH STENT PLACEMENT Right 11/03/2015   Procedure: CYSTOSCOPY WITH STENT PLACEMENT;  Surgeon: Hollice Espy, MD;  Location: [facility] ORS;  Service: Urology;  Laterality: Right;  . FRACTURE SURGERY Right    Elbow  . TONSILLECTOMY    . 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 Smoking status: Never Smoker  . Smokeless tobacco: Never Used  . Alcohol use No  Family History      Family History  Problem Relation Age of Onset  . Diabetes Father   . Heart disease Father   . Stroke Maternal Grandmother         Allergies  Allergen Reactions  . Prednisone Anaphylaxis     REVIEW OF SYSTEMS (Negative unless checked)  Constitutional: [] Weight loss  [] Fever  [] Chills Cardiac: [] Chest pain   [] Chest pressure   [] Palpitations   [] Shortness of breath when laying flat   [] Shortness of breath at rest   [] Shortness of breath with exertion. Vascular:  [] Pain in legs with walking   [] Pain in legs at rest   [] Pain in legs when laying flat   [] Claudication   [] Pain in feet when walking  [] Pain in feet at  rest  [] Pain in feet when laying flat   [] History of DVT   [] Phlebitis   [x] Swelling in legs   [] Varicose veins   [] Non-healing ulcers Pulmonary:   [] Uses home oxygen   [] Productive cough   [] Hemoptysis   [] Wheeze  [] COPD   [] Asthma Neurologic:  [] Dizziness  [] Blackouts   [] Seizures   [] History of stroke   [] History of TIA  [] Aphasia   [] Temporary blindness   [] Dysphagia   [] Weakness or numbness in arms   [] Weakness or numbness in legs Musculoskeletal:  [] Arthritis   [] Joint swelling   [] Joint pain   [] Low back pain Hematologic:  [] Easy bruising  [] Easy bleeding   [] Hypercoagulable state   [] Anemic   Gastrointestinal:  [] Blood in stool   [] Vomiting blood  [] Gastroesophageal reflux/heartburn   [] Abdominal pain Genitourinary:  [] Chronic kidney disease   [] Difficult urination  [] Frequent urination  [] Burning with urination   [] Hematuria Skin:  [] Rashes   [x] Ulcers   [x] Wounds Psychological:  [x] History of anxiety   []  History of major depression.   Physical Examination  BP 139/78 (BP Location: Right Arm)   Pulse 74   Resp 16   Ht 5\' 7"  (1.702 m)   Wt 247 lb (112 kg)   BMI 38.69 kg/m  Gen:  WD/WN, NAD Head: [place]/AT, No temporalis wasting. Ear/Nose/Throat: Hearing grossly intact, nares w/o erythema or drainage, trachea midline Eyes: Conjunctiva clear. Sclera non-icteric Neck: Supple.  No JVD.  Pulmonary:  Good air movement, no use of accessory muscles.  Cardiac: RRR, normal S1, S2 Vascular:  Vessel Right Left  Radial Palpable Palpable  Ulnar Palpable Palpable  Brachial Palpable Palpable  Carotid Palpable, without bruit Palpable, without bruit  Aorta Not palpable N/A  Femoral Palpable Palpable  Popliteal Palpable Palpable  PT Palpable Palpable  DP Palpable Palpable    Musculoskeletal: M/S 5/5 throughout.  No deformity or atrophy. Basically no appreciable edema today. Transmetatarsal amputations bilaterally well-healed Neurologic: Sensation grossly intact in extremities.  Symmetrical.   Speech is fluent.  Psychiatric: Judgment intact, Mood & affect appropriate for pt's clinical situation. Dermatologic: No rashes or ulcers noted.  No cellulitis or open wounds.       Labs No results found for this or any previous visit (from the past 2160 hour(s)).  Radiology No results found.   Assessment/Plan Type 2 diabetes mellitus (HCC) blood glucose control important in reducing the progression of atherosclerotic disease. Also, involved in wound healing. On appropriate medications.  Swelling of limb Compression and elevation recommended. Improved with improved mobility in general.  Ulcer of calf (HCC) Now healed  Pain in limb Overall, the patient is doing pretty well. We'll plan to check ABIs again in about 6 months. Her  perfusion is been relatively well-preserved previously but she has already had transmetatarsal amputations bilaterally.    Leotis Pain, MD  03/21/2017 4:19 PM    This note was created with Dragon medical transcription system.  Any errors from dictation are purely unintentional

## 2017-03-21 NOTE — Assessment & Plan Note (Signed)
Overall, the patient is doing pretty well. We'll plan to check ABIs again in about 6 months. Her perfusion is been relatively well-preserved previously but she has already had transmetatarsal amputations bilaterally.

## 2017-03-21 NOTE — Assessment & Plan Note (Signed)
Compression and elevation recommended. Improved with improved mobility in general.

## 2017-08-03 ENCOUNTER — Encounter (INDEPENDENT_AMBULATORY_CARE_PROVIDER_SITE_OTHER): Payer: Self-pay

## 2017-09-26 HISTORY — PX: OTHER SURGICAL HISTORY: SHX169

## 2017-10-03 ENCOUNTER — Ambulatory Visit (INDEPENDENT_AMBULATORY_CARE_PROVIDER_SITE_OTHER): Payer: Medicare HMO | Admitting: Vascular Surgery

## 2017-10-03 ENCOUNTER — Ambulatory Visit (INDEPENDENT_AMBULATORY_CARE_PROVIDER_SITE_OTHER): Payer: Medicare HMO

## 2017-10-03 ENCOUNTER — Encounter (INDEPENDENT_AMBULATORY_CARE_PROVIDER_SITE_OTHER): Payer: Self-pay | Admitting: Vascular Surgery

## 2017-10-03 VITALS — BP 149/82 | HR 78 | Resp 18 | Wt 257.0 lb

## 2017-10-03 DIAGNOSIS — I739 Peripheral vascular disease, unspecified: Secondary | ICD-10-CM | POA: Diagnosis not present

## 2017-10-03 DIAGNOSIS — I10 Essential (primary) hypertension: Secondary | ICD-10-CM | POA: Diagnosis not present

## 2017-10-03 DIAGNOSIS — M79605 Pain in left leg: Secondary | ICD-10-CM

## 2017-10-03 DIAGNOSIS — E118 Type 2 diabetes mellitus with unspecified complications: Secondary | ICD-10-CM

## 2017-10-03 DIAGNOSIS — M79604 Pain in right leg: Secondary | ICD-10-CM | POA: Diagnosis not present

## 2017-10-03 NOTE — Progress Notes (Signed)
MRN : [id number]  April [name] is a 72 y.o. (11/26/1945) female who presents with chief complaint of  Chief Complaint  Patient presents with  . Follow-up    75mo ABI   History of Present Illness: Patient returns today in follow up of peripheral artery disease.  The patient presents for a six-month peripheral artery disease follow-up.  The patient presents today without complaint.  The patient denies any claudication-like symptoms, rest pain or ulceration to the bilateral lower extremity.  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  The patient states her amputation skin is intact.  The patient underwent a bilateral ABI which was notable for bilateral triphasic tibials bilateral ankle-brachial indices and Doppler waveforms suggesting no significant lower extremity arterial disease.  Patient denies any fever, nausea or vomiting.        Current Outpatient Prescriptions  Medication Sig Dispense Refill  . acetaminophen (TYLENOL) 500 MG tablet Take 1,000 mg by mouth every 8 (eight) hours as needed for moderate pain. Reported on 12/16/2015    . aspirin EC 81 MG EC tablet Take 1 tablet (81 mg total) by mouth daily. 90 tablet 3  . diclofenac sodium (VOLTAREN) 1 % GEL Apply 4 g topically 4 (four) times daily.    Marland Kitchen docusate sodium (COLACE) 100 MG capsule Take 1 capsule (100 mg total) by mouth 2 (two) times daily. 10 capsule 0  . HYDROmorphone (DILAUDID) 2 MG tablet     . insulin glargine (LANTUS) 100 UNIT/ML injection Inject 0.15 mLs (15 Units total) into the skin daily. (Patient taking differently: Inject 15 Units into the skin daily. With Lunch) 10 mL 11  . metoprolol tartrate (LOPRESSOR) 25 MG tablet Take 1 tablet (25 mg total) by mouth 2 (two) times daily.    . ondansetron (ZOFRAN) 4 MG tablet Take 1 tablet (4 mg total) by mouth every 6 (six) hours as needed for nausea. 20 tablet 0  . oxyCODONE (OXY IR/ROXICODONE) 5 MG immediate release tablet Take 1 tablet  (5 mg total) by mouth every 6 (six) hours as needed for moderate pain or severe pain. 30 tablet 0  . polyethylene glycol (MIRALAX / GLYCOLAX) packet Take 17 g by mouth daily. 14 each 0  . pravastatin (PRAVACHOL) 20 MG tablet     . tamsulosin (FLOMAX) 0.4 MG CAPS capsule Take 1 capsule (0.4 mg total) by mouth daily. 30 capsule   . traMADol (ULTRAM) 50 MG tablet     . traZODone (DESYREL) 50 MG tablet Take 50 mg by mouth at bedtime.     No current facility-administered medications for this visit.        Past Medical History:  Diagnosis Date  . Acute renal failure (ARF) ([place])   . Hypertension   . NSTEMI (non-ST elevated myocardial infarction) ([place])   . Paroxysmal atrial fibrillation (HCC)   . Renal disorder   . Sepsis [facility]) January 2017   [facility]  . Type 2 diabetes mellitus ([place])         Past Surgical History:  Procedure Laterality Date  . CYSTOSCOPY WITH STENT PLACEMENT Right 11/03/2015   Procedure: CYSTOSCOPY WITH STENT PLACEMENT; Surgeon: Hollice Espy, MD; Location: [facility] ORS; Service: Urology; Laterality: Right;  . FRACTURE SURGERY Right    Elbow  . TONSILLECTOMY    . 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   Procedure: TRANSMETATARSAL AMPUTATION; Surgeon: Algernon Huxley, MD; Location: [facility] ORS; Service: Vascular; Laterality: Bilateral;   Social History     Social History  Substance Use Topics  . Smoking status: Never Smoker  . Smokeless tobacco: Never Used  . Alcohol use No   Family History      Family History  Problem Relation Age of Onset  . Diabetes Father   . Heart disease Father   . Stroke Maternal Grandmother        Allergies  Allergen Reactions  . Prednisone Anaphylaxis   REVIEW OF SYSTEMS(Negative unless checked)  Constitutional: [] Weight loss[] Fever[] Chills Cardiac:[] Chest pain[] Chest pressure[] Palpitations [] Shortness of breath when laying flat [] Shortness of breath at rest  [] Shortness of breath with exertion. Vascular: [] Pain in legs with walking[] Pain in legsat rest[] Pain in legs when laying flat [] Claudication [] Pain in feet when walking [] Pain in feet at rest [] Pain in feet when laying flat [] History of DVT [] Phlebitis [x] Swelling in legs [] Varicose veins [] Non-healing ulcers Pulmonary: [] Uses home oxygen [] Productive cough[] Hemoptysis [] Wheeze [] COPD [] Asthma Neurologic: [] Dizziness [] Blackouts [] Seizures [] History of stroke [] History of TIA[] Aphasia [] Temporary blindness[] Dysphagia [] Weaknessor numbness in arms [] Weakness or numbnessin legs Musculoskeletal: [] Arthritis [] Joint swelling [] Joint pain [] Low back pain Hematologic:[] Easy bruising[] Easy bleeding [] Hypercoagulable state [] Anemic  Gastrointestinal:[] Blood in stool[] Vomiting blood[] Gastroesophageal reflux/heartburn[] Abdominal pain Genitourinary: [] Chronic kidney disease [] Difficulturination [] Frequenturination [] Burning with urination[] Hematuria Skin: [] Rashes [x] Ulcers [x] Wounds Psychological: [x] History of anxiety[] History of major depression.  Physical Examination  BP (!) 149/82 (BP Location: Right Arm)   Pulse 78   Resp 18   Wt 257 lb (116.6 kg)   BMI 40.25 kg/m  Gen:  WD/WN, NAD Head: [place]/AT, No temporalis wasting. Ear/Nose/Throat: Hearing grossly intact, nares w/o erythema or drainage, trachea midline Eyes: Conjunctiva clear. Sclera non-icteric Neck: Supple.  No JVD.  Pulmonary:  Good air movement, no use of accessory muscles.  Cardiac: RRR, normal S1, S2 Vascular:  Vessel Right Left  Radial Palpable Palpable  Ulnar Palpable Palpable  Brachial Palpable Palpable  Carotid Palpable, without bruit Palpable, without bruit  Aorta Not palpable N/A  Femoral Palpable Palpable  Popliteal Palpable Palpable  PT Palpable Palpable  DP  transmetatarsal amputation  transmetatarsal amputation     Gastrointestinal: soft, non-tender/non-distended. No guarding/reflex.  Musculoskeletal: M/S 5/5 throughout.  No deformity or atrophy.  Minimal edema noted bilaterally. Neurologic: Sensation grossly intact in extremities.  Symmetrical.  Speech is fluent.  Psychiatric: Judgment intact, Mood & affect appropriate for pt's clinical situation. Dermatologic: No rashes or ulcers noted.  No cellulitis or open wounds. Lymph : No Cervical, Axillary, or Inguinal lymphadenopathy.  Labs No results found for this or any previous visit (from the past 2160 hour(s)).  Radiology No results found.  Assessment/Plan Patient returns today in follow up of peripheral artery disease.  The patient presents for a six-month peripheral artery disease follow-up.  The patient presents today without complaint.  The patient denies any claudication-like symptoms, rest pain or ulceration to the bilateral lower extremity.  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  The patient states her amputation skin is intact.  The patient underwent a bilateral ABI which was notable for bilateral triphasic tibials bilateral ankle-brachial indices and Doppler waveforms suggesting no significant lower extremity arterial disease.  Patient denies any fever, nausea or vomiting.  Type 2 diabetes mellitus (HCC) blood glucose control important in reducing the progression of atherosclerotic disease. Also, involved in wound healing. On appropriate medications.  Swelling of limb Compression and elevation recommended. Improved with improved mobility in general.  Ulcer of calf (HCC) Now healed  KIMBERLY A STEGMAYER, PA-C  10/03/2017 4:42 PM  This note was created with Dragon medical transcription system.  Any errors from dictation  are purely unintentional

## 2018-04-03 ENCOUNTER — Ambulatory Visit (INDEPENDENT_AMBULATORY_CARE_PROVIDER_SITE_OTHER): Payer: Medicare HMO | Admitting: Vascular Surgery

## 2018-04-03 ENCOUNTER — Ambulatory Visit (INDEPENDENT_AMBULATORY_CARE_PROVIDER_SITE_OTHER): Payer: Medicare HMO

## 2018-04-03 ENCOUNTER — Encounter (INDEPENDENT_AMBULATORY_CARE_PROVIDER_SITE_OTHER): Payer: Self-pay | Admitting: Vascular Surgery

## 2018-04-03 VITALS — BP 159/84 | HR 70 | Resp 16 | Ht 67.0 in | Wt 254.0 lb

## 2018-04-03 DIAGNOSIS — I10 Essential (primary) hypertension: Secondary | ICD-10-CM | POA: Diagnosis not present

## 2018-04-03 DIAGNOSIS — I739 Peripheral vascular disease, unspecified: Secondary | ICD-10-CM

## 2018-04-03 DIAGNOSIS — E118 Type 2 diabetes mellitus with unspecified complications: Secondary | ICD-10-CM

## 2018-04-03 DIAGNOSIS — I96 Gangrene, not elsewhere classified: Secondary | ICD-10-CM

## 2018-04-03 NOTE — Progress Notes (Signed)
MRN : [id number]  April [name] is a 72 y.o. (1946/06/12) female who presents with chief complaint of  Chief Complaint  Patient presents with  . Follow-up    73month abi   .  History of Present Illness: Patient returns today in follow up.  She is doing well.  She does have an area on her right transmetatarsal amputation which has some bleeding and drainage intermittently.  No signs of infection.  No fevers or chills.  Her ABIs today are 1.18 on the right and 1.10 on the left with good triphasic waveforms.  Current Outpatient Medications  Medication Sig Dispense Refill  . acetaminophen (TYLENOL) 500 MG tablet Take 1,000 mg by mouth every 8 (eight) hours as needed for moderate pain. Reported on 12/16/2015    . aspirin EC 81 MG EC tablet Take 1 tablet (81 mg total) by mouth daily. 90 tablet 3  . diclofenac sodium (VOLTAREN) 1 % GEL Apply 4 g topically 4 (four) times daily.    Marland Kitchen docusate sodium (COLACE) 100 MG capsule Take 1 capsule (100 mg total) by mouth 2 (two) times daily. 10 capsule 0  . insulin glargine (LANTUS) 100 UNIT/ML injection Inject 0.15 mLs (15 Units total) into the skin daily. (Patient not taking: Reported on 10/03/2017) 10 mL 11  . metFORMIN (GLUCOPHAGE) 500 MG tablet 2 (two) times daily.    . metoprolol tartrate (LOPRESSOR) 25 MG tablet Take 1 tablet (25 mg total) by mouth 2 (two) times daily.    Marland Kitchen oxyCODONE (OXY IR/ROXICODONE) 5 MG immediate release tablet Take 1 tablet (5 mg total) by mouth every 6 (six) hours as needed for moderate pain or severe pain. (Patient not taking: Reported on 10/03/2017) 30 tablet 0  . polyethylene glycol (MIRALAX / GLYCOLAX) packet Take 17 g by mouth daily. (Patient not taking: Reported on 10/03/2017) 14 each 0  . pravastatin (PRAVACHOL) 20 MG tablet     . tamsulosin (FLOMAX) 0.4 MG CAPS capsule Take 1 capsule (0.4 mg total) by mouth daily. 30 capsule   . traMADol (ULTRAM) 50 MG tablet     . traZODone (DESYREL) 50 MG tablet Take 50 mg by mouth at  bedtime.     No current facility-administered medications for this visit.     Past Medical History:  Diagnosis Date  . Acute renal failure (ARF) ([place])   . Hypertension   . NSTEMI (non-ST elevated myocardial infarction) ([place])   . Paroxysmal atrial fibrillation (HCC)   . Renal disorder   . Sepsis [facility]) January 2017   [facility]  . Type 2 diabetes mellitus ([place])     Past Surgical History:  Procedure Laterality Date  . CYSTOSCOPY WITH STENT PLACEMENT Right 11/03/2015   Procedure: CYSTOSCOPY WITH STENT PLACEMENT;  Surgeon: Hollice Espy, MD;  Location: [facility] ORS;  Service: Urology;  Laterality: Right;  . FRACTURE SURGERY Right    Elbow  . TONSILLECTOMY    . 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   Procedure: TRANSMETATARSAL AMPUTATION;  Surgeon: Algernon Huxley, MD;  Location: [facility] ORS;  Service: Vascular;  Laterality: Bilateral;    Social History  Substance Use Topics  . Smoking status: Never Smoker  . Smokeless tobacco: Never Used  . Alcohol use No    Family History      Family History  Problem Relation Age of Onset  . Diabetes Father   . Heart disease Father   . Stroke Maternal Grandmother         Allergies  Allergen Reactions  .  Prednisone Anaphylaxis     REVIEW OF SYSTEMS(Negative unless checked)  Constitutional: [] Weight loss[] Fever[] Chills Cardiac:[] Chest pain[] Chest pressure[] Palpitations [] Shortness of breath when laying flat [] Shortness of breath at rest [] Shortness of breath with exertion. Vascular: [] Pain in legs with walking[] Pain in legsat rest[] Pain in legs when laying flat [] Claudication [] Pain in feet when walking [] Pain in feet at rest [] Pain in feet when laying flat [] History of DVT [] Phlebitis [x] Swelling in legs [] Varicose veins [] Non-healing ulcers Pulmonary: [] Uses home oxygen [] Productive cough[] Hemoptysis [] Wheeze [] COPD [] Asthma Neurologic: [] Dizziness [] Blackouts  [] Seizures [] History of stroke [] History of TIA[] Aphasia [] Temporary blindness[] Dysphagia [] Weaknessor numbness in arms [] Weakness or numbnessin legs Musculoskeletal: [] Arthritis [] Joint swelling [] Joint pain [] Low back pain Hematologic:[] Easy bruising[] Easy bleeding [] Hypercoagulable state [] Anemic  Gastrointestinal:[] Blood in stool[] Vomiting blood[] Gastroesophageal reflux/heartburn[] Abdominal pain Genitourinary: [] Chronic kidney disease [] Difficulturination [] Frequenturination [] Burning with urination[] Hematuria Skin: [] Rashes [x] Ulcers [x] Wounds Psychological: [x] History of anxiety[] History of major depression.     Physical Examination  BP (!) 159/84 (BP Location: Right Arm)   Pulse 70   Resp 16   Ht 5\' 7"  (1.702 m)   Wt 254 lb (115.2 kg)   BMI 39.78 kg/m  Gen:  WD/WN, NAD Head: [place]/AT, No temporalis wasting. Ear/Nose/Throat: Hearing grossly intact, nares w/o erythema or drainage Eyes: Conjunctiva clear. Sclera non-icteric Neck: Supple.  Trachea midline Pulmonary:  Good air movement, no use of accessory muscles.  Cardiac: RRR, no JVD Vascular:  Vessel Right Left  Radial Palpable Palpable                          PT 1+ Palpable Palpable  DP Palpable Palpable   Gastrointestinal: soft, non-tender/non-distended. No guarding/reflex.  Musculoskeletal: M/S 5/5 throughout.  Trace LE edema.  Good capillary refill. Neurologic: Sensation grossly intact in extremities.  Symmetrical.  Speech is fluent.  Psychiatric: Judgment intact, Mood & affect appropriate for pt's clinical situation. Dermatologic: Both TMAs are well healed.       Labs No results found for this or any previous visit (from the past 2160 hour(s)).  Radiology No results found.  Assessment/Plan Type 2 diabetes mellitus (HCC) blood glucose control important in reducing the progression of atherosclerotic disease. Also, involved in wound  healing. On appropriate medications.  Swelling of limb Compression and elevation recommended. 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 Her ABIs today are 1.18 on the right and 1.10 on the left with good triphasic waveforms.  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  There are no signs of infection.  Recheck in 1 year.    Leotis Pain, MD  04/03/2018 3:26 PM    This note was created with Dragon medical transcription system.  Any errors from dictation are purely unintentional

## 2018-04-03 NOTE — Assessment & Plan Note (Signed)
This post transmetatarsal amputations which are well-healed. Her ABIs today are 1.18 on the right and 1.10 on the left with good triphasic waveforms.  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  There are no signs of infection.  Recheck in 1 year.

## 2019-02-05 ENCOUNTER — Other Ambulatory Visit: Payer: Self-pay

## 2019-02-05 ENCOUNTER — Emergency Department: Payer: Medicare HMO

## 2019-02-05 ENCOUNTER — Inpatient Hospital Stay
Admission: EM | Admit: 2019-02-05 | Discharge: 2019-02-12 | DRG: 853 | Disposition: A | Discharge status: Skilled Nursing Facility | Payer: Medicare HMO | Attending: Internal Medicine | Admitting: Internal Medicine

## 2019-02-05 DIAGNOSIS — Z89431 Acquired absence of right foot: Secondary | ICD-10-CM

## 2019-02-05 DIAGNOSIS — Z89432 Acquired absence of left foot: Secondary | ICD-10-CM

## 2019-02-05 DIAGNOSIS — I248 Other forms of acute ischemic heart disease: Secondary | ICD-10-CM | POA: Diagnosis present

## 2019-02-05 DIAGNOSIS — E669 Obesity, unspecified: Secondary | ICD-10-CM | POA: Diagnosis present

## 2019-02-05 DIAGNOSIS — Z8619 Personal history of other infectious and parasitic diseases: Secondary | ICD-10-CM | POA: Diagnosis not present

## 2019-02-05 DIAGNOSIS — I480 Paroxysmal atrial fibrillation: Secondary | ICD-10-CM | POA: Diagnosis present

## 2019-02-05 DIAGNOSIS — R0902 Hypoxemia: Secondary | ICD-10-CM | POA: Diagnosis not present

## 2019-02-05 DIAGNOSIS — A4152 Sepsis due to Pseudomonas: Secondary | ICD-10-CM | POA: Diagnosis present

## 2019-02-05 DIAGNOSIS — Z6841 Body Mass Index (BMI) 40.0 and over, adult: Secondary | ICD-10-CM | POA: Diagnosis not present

## 2019-02-05 DIAGNOSIS — Z888 Allergy status to other drugs, medicaments and biological substances status: Secondary | ICD-10-CM | POA: Diagnosis not present

## 2019-02-05 DIAGNOSIS — Z89422 Acquired absence of other left toe(s): Secondary | ICD-10-CM | POA: Diagnosis not present

## 2019-02-05 DIAGNOSIS — Y92019 Unspecified place in single-family (private) house as the place of occurrence of the external cause: Secondary | ICD-10-CM

## 2019-02-05 DIAGNOSIS — I10 Essential (primary) hypertension: Secondary | ICD-10-CM | POA: Diagnosis present

## 2019-02-05 DIAGNOSIS — Z89421 Acquired absence of other right toe(s): Secondary | ICD-10-CM | POA: Diagnosis not present

## 2019-02-05 DIAGNOSIS — R05 Cough: Secondary | ICD-10-CM

## 2019-02-05 DIAGNOSIS — S72141A Displaced intertrochanteric fracture of right femur, initial encounter for closed fracture: Secondary | ICD-10-CM | POA: Diagnosis present

## 2019-02-05 DIAGNOSIS — D72829 Elevated white blood cell count, unspecified: Secondary | ICD-10-CM | POA: Diagnosis not present

## 2019-02-05 DIAGNOSIS — I160 Hypertensive urgency: Secondary | ICD-10-CM | POA: Diagnosis present

## 2019-02-05 DIAGNOSIS — B965 Pseudomonas (aeruginosa) (mallei) (pseudomallei) as the cause of diseases classified elsewhere: Secondary | ICD-10-CM | POA: Diagnosis not present

## 2019-02-05 DIAGNOSIS — Z419 Encounter for procedure for purposes other than remedying health state, unspecified: Secondary | ICD-10-CM

## 2019-02-05 DIAGNOSIS — E785 Hyperlipidemia, unspecified: Secondary | ICD-10-CM | POA: Diagnosis present

## 2019-02-05 DIAGNOSIS — J209 Acute bronchitis, unspecified: Secondary | ICD-10-CM | POA: Diagnosis not present

## 2019-02-05 DIAGNOSIS — D649 Anemia, unspecified: Secondary | ICD-10-CM | POA: Diagnosis present

## 2019-02-05 DIAGNOSIS — N1330 Unspecified hydronephrosis: Secondary | ICD-10-CM

## 2019-02-05 DIAGNOSIS — Z87442 Personal history of urinary calculi: Secondary | ICD-10-CM

## 2019-02-05 DIAGNOSIS — N390 Urinary tract infection, site not specified: Secondary | ICD-10-CM | POA: Diagnosis not present

## 2019-02-05 DIAGNOSIS — D696 Thrombocytopenia, unspecified: Secondary | ICD-10-CM | POA: Diagnosis not present

## 2019-02-05 DIAGNOSIS — L299 Pruritus, unspecified: Secondary | ICD-10-CM | POA: Diagnosis not present

## 2019-02-05 DIAGNOSIS — S72001D Fracture of unspecified part of neck of right femur, subsequent encounter for closed fracture with routine healing: Secondary | ICD-10-CM | POA: Diagnosis not present

## 2019-02-05 DIAGNOSIS — W19XXXD Unspecified fall, subsequent encounter: Secondary | ICD-10-CM | POA: Diagnosis not present

## 2019-02-05 DIAGNOSIS — Z823 Family history of stroke: Secondary | ICD-10-CM

## 2019-02-05 DIAGNOSIS — E1151 Type 2 diabetes mellitus with diabetic peripheral angiopathy without gangrene: Secondary | ICD-10-CM | POA: Diagnosis present

## 2019-02-05 DIAGNOSIS — Z20828 Contact with and (suspected) exposure to other viral communicable diseases: Secondary | ICD-10-CM | POA: Diagnosis present

## 2019-02-05 DIAGNOSIS — Z8781 Personal history of (healed) traumatic fracture: Secondary | ICD-10-CM

## 2019-02-05 DIAGNOSIS — Z7901 Long term (current) use of anticoagulants: Secondary | ICD-10-CM

## 2019-02-05 DIAGNOSIS — I2510 Atherosclerotic heart disease of native coronary artery without angina pectoris: Secondary | ICD-10-CM | POA: Diagnosis present

## 2019-02-05 DIAGNOSIS — I252 Old myocardial infarction: Secondary | ICD-10-CM | POA: Diagnosis not present

## 2019-02-05 DIAGNOSIS — R410 Disorientation, unspecified: Secondary | ICD-10-CM | POA: Diagnosis present

## 2019-02-05 DIAGNOSIS — R0602 Shortness of breath: Secondary | ICD-10-CM

## 2019-02-05 DIAGNOSIS — R8271 Bacteriuria: Secondary | ICD-10-CM | POA: Diagnosis not present

## 2019-02-05 DIAGNOSIS — W19XXXA Unspecified fall, initial encounter: Secondary | ICD-10-CM | POA: Diagnosis present

## 2019-02-05 DIAGNOSIS — Z833 Family history of diabetes mellitus: Secondary | ICD-10-CM | POA: Diagnosis not present

## 2019-02-05 DIAGNOSIS — R059 Cough, unspecified: Secondary | ICD-10-CM

## 2019-02-05 DIAGNOSIS — G9340 Encephalopathy, unspecified: Secondary | ICD-10-CM

## 2019-02-05 DIAGNOSIS — R270 Ataxia, unspecified: Secondary | ICD-10-CM | POA: Diagnosis not present

## 2019-02-05 DIAGNOSIS — Z872 Personal history of diseases of the skin and subcutaneous tissue: Secondary | ICD-10-CM | POA: Diagnosis not present

## 2019-02-05 DIAGNOSIS — E1142 Type 2 diabetes mellitus with diabetic polyneuropathy: Secondary | ICD-10-CM | POA: Diagnosis present

## 2019-02-05 DIAGNOSIS — Z7982 Long term (current) use of aspirin: Secondary | ICD-10-CM

## 2019-02-05 DIAGNOSIS — S7223XA Displaced subtrochanteric fracture of unspecified femur, initial encounter for closed fracture: Secondary | ICD-10-CM

## 2019-02-05 DIAGNOSIS — K5900 Constipation, unspecified: Secondary | ICD-10-CM | POA: Diagnosis not present

## 2019-02-05 DIAGNOSIS — Z8249 Family history of ischemic heart disease and other diseases of the circulatory system: Secondary | ICD-10-CM | POA: Diagnosis not present

## 2019-02-05 DIAGNOSIS — Z79899 Other long term (current) drug therapy: Secondary | ICD-10-CM

## 2019-02-05 DIAGNOSIS — Z794 Long term (current) use of insulin: Secondary | ICD-10-CM

## 2019-02-05 DIAGNOSIS — A419 Sepsis, unspecified organism: Secondary | ICD-10-CM

## 2019-02-05 HISTORY — DX: Failed or difficult intubation, initial encounter: T88.4XXA

## 2019-02-05 LAB — URINALYSIS, COMPLETE (UACMP) WITH MICROSCOPIC
Bilirubin Urine: NEGATIVE
Glucose, UA: >=500 mg/dL — AB
Ketones, ur: 20 mg/dL — AB
Nitrite: NEGATIVE
Protein, ur: 100 mg/dL — AB
Specific Gravity, Urine: 1.02 (ref 1.005–1.030)
WBC, UA: >50 WBC/hpf — ABNORMAL HIGH (ref 0–5)
pH: 5 (ref 5.0–8.0)

## 2019-02-05 LAB — TROPONIN I: Troponin I: 0.03 ng/mL (ref <0.03)

## 2019-02-05 LAB — CBC WITH DIFFERENTIAL/PLATELET
Abs Immature Granulocytes: 0.56 10*3/uL — ABNORMAL HIGH (ref 0.00–0.07)
Basophils Absolute: 0.1 10*3/uL (ref 0.0–0.1)
Basophils Relative: 0 %
Eosinophils Absolute: 0.1 10*3/uL (ref 0.0–0.5)
Eosinophils Relative: 0 %
HCT: 49.7 % — ABNORMAL HIGH (ref 36.0–46.0)
Hemoglobin: 16.4 g/dL — ABNORMAL HIGH (ref 12.0–15.0)
Immature Granulocytes: 2 %
Lymphocytes Relative: 3 %
Lymphs Abs: 0.9 10*3/uL (ref 0.7–4.0)
MCH: 30.2 pg (ref 26.0–34.0)
MCHC: 33 g/dL (ref 30.0–36.0)
MCV: 91.5 fL (ref 80.0–100.0)
Monocytes Absolute: 2.3 10*3/uL — ABNORMAL HIGH (ref 0.1–1.0)
Monocytes Relative: 6 %
Neutro Abs: 31.3 10*3/uL — ABNORMAL HIGH (ref 1.7–7.7)
Neutrophils Relative %: 89 %
Platelets: 209 10*3/uL (ref 150–400)
RBC: 5.43 MIL/uL — ABNORMAL HIGH (ref 3.87–5.11)
RDW: 13.8 % (ref 11.5–15.5)
WBC: 35.1 10*3/uL — ABNORMAL HIGH (ref 4.0–10.5)
nRBC: 0 % (ref 0.0–0.2)

## 2019-02-05 LAB — COMPREHENSIVE METABOLIC PANEL
ALT: 42 U/L (ref 0–44)
AST: 68 U/L — ABNORMAL HIGH (ref 15–41)
Albumin: 3.5 g/dL (ref 3.5–5.0)
Alkaline Phosphatase: 94 U/L (ref 38–126)
Anion gap: 16 — ABNORMAL HIGH (ref 5–15)
BUN: 29 mg/dL — ABNORMAL HIGH (ref 8–23)
CO2: 24 mmol/L (ref 22–32)
Calcium: 9 mg/dL (ref 8.9–10.3)
Chloride: 96 mmol/L — ABNORMAL LOW (ref 98–111)
Creatinine, Ser: 0.98 mg/dL (ref 0.44–1.00)
GFR calc Af Amer: >60 mL/min (ref >60)
GFR calc non Af Amer: 58 mL/min — ABNORMAL LOW (ref >60)
Glucose, Bld: 307 mg/dL — ABNORMAL HIGH (ref 70–99)
Potassium: 3.6 mmol/L (ref 3.5–5.1)
Sodium: 136 mmol/L (ref 135–145)
Total Bilirubin: 1.2 mg/dL (ref 0.3–1.2)
Total Protein: 8 g/dL (ref 6.5–8.1)

## 2019-02-05 LAB — LACTIC ACID, PLASMA: Lactic Acid, Venous: 3.1 mmol/L (ref 0.5–1.9)

## 2019-02-05 LAB — PROTIME-INR
INR: 1.3 — ABNORMAL HIGH (ref 0.8–1.2)
Prothrombin Time: 15.6 seconds — ABNORMAL HIGH (ref 11.4–15.2)

## 2019-02-05 LAB — SARS CORONAVIRUS 2 BY RT PCR (HOSPITAL ORDER, PERFORMED IN ~~LOC~~ HOSPITAL LAB): SARS Coronavirus 2: NEGATIVE

## 2019-02-05 LAB — PROCALCITONIN: Procalcitonin: 1.93 ng/mL

## 2019-02-05 MED ORDER — SODIUM CHLORIDE 0.9 % IV BOLUS
1000.0000 mL | Freq: Once | INTRAVENOUS | Status: AC
  Administered 2019-02-05: 1000 mL via INTRAVENOUS

## 2019-02-05 MED ORDER — VANCOMYCIN HCL 10 G IV SOLR
2000.0000 mg | Freq: Once | INTRAVENOUS | Status: AC
  Administered 2019-02-05: 23:00:00 2000 mg via INTRAVENOUS
  Filled 2019-02-05: qty 2000

## 2019-02-05 MED ORDER — MAGNESIUM HYDROXIDE 400 MG/5ML PO SUSP
30.0000 mL | Freq: Every day | ORAL | Status: DC | PRN
  Administered 2019-02-12: 09:00:00 30 mL via ORAL
  Filled 2019-02-05 (×2): qty 30

## 2019-02-05 MED ORDER — SODIUM CHLORIDE 0.9 % IV SOLN
2.0000 g | Freq: Three times a day (TID) | INTRAVENOUS | Status: DC
  Administered 2019-02-06 – 2019-02-08 (×7): 2 g via INTRAVENOUS
  Filled 2019-02-05 (×9): qty 2

## 2019-02-05 MED ORDER — ACETAMINOPHEN 650 MG RE SUPP: 650.0000 mg | Freq: Four times a day (QID) | RECTAL | Status: DC | PRN

## 2019-02-05 MED ORDER — SODIUM CHLORIDE 0.9 % IV SOLN
INTRAVENOUS | Status: DC
  Administered 2019-02-06 – 2019-02-07 (×4): via INTRAVENOUS

## 2019-02-05 MED ORDER — SODIUM CHLORIDE 0.9 % IV BOLUS: 500.0000 mL | Freq: Once | INTRAVENOUS | Status: DC

## 2019-02-05 MED ORDER — ONDANSETRON HCL 4 MG PO TABS
4.0000 mg | ORAL_TABLET | Freq: Four times a day (QID) | ORAL | Status: DC | PRN
  Administered 2019-02-06: 4 mg via ORAL
  Filled 2019-02-05: qty 1

## 2019-02-05 MED ORDER — FENTANYL CITRATE (PF) 100 MCG/2ML IJ SOLN
50.0000 ug | INTRAMUSCULAR | Status: DC | PRN
  Administered 2019-02-05: 50 ug via INTRAVENOUS
  Filled 2019-02-05: qty 2

## 2019-02-05 MED ORDER — KETOROLAC TROMETHAMINE 30 MG/ML IJ SOLN
15.0000 mg | Freq: Four times a day (QID) | INTRAMUSCULAR | Status: AC | PRN
  Administered 2019-02-06: 15 mg via INTRAVENOUS
  Filled 2019-02-05: qty 1

## 2019-02-05 MED ORDER — ACETAMINOPHEN 325 MG PO TABS
650.0000 mg | ORAL_TABLET | Freq: Four times a day (QID) | ORAL | Status: DC | PRN
  Administered 2019-02-06: 650 mg via ORAL
  Filled 2019-02-05: qty 2

## 2019-02-05 MED ORDER — ONDANSETRON HCL 4 MG/2ML IJ SOLN: 4.0000 mg | Freq: Four times a day (QID) | INTRAMUSCULAR | Status: DC | PRN

## 2019-02-05 MED ORDER — VANCOMYCIN HCL IN DEXTROSE 1-5 GM/200ML-% IV SOLN: 1000.0000 mg | Freq: Once | INTRAVENOUS | Status: DC

## 2019-02-05 MED ORDER — SODIUM CHLORIDE 0.9 % IV SOLN
2.0000 g | Freq: Once | INTRAVENOUS | Status: AC
  Administered 2019-02-05: 23:00:00 2 g via INTRAVENOUS
  Filled 2019-02-05: qty 2

## 2019-02-05 NOTE — ED Notes (Signed)
Patient transported to CT 

## 2019-02-05 NOTE — ED Triage Notes (Signed)
Pt lives at home alone and had a fall today and hit right hip. Pain 10/10 pt also with strong smell of urine

## 2019-02-05 NOTE — ED Notes (Signed)
This RN placed purewick on pt at this time. Peri-care performed before purewick placed.

## 2019-02-05 NOTE — ED Provider Notes (Signed)
[facility] Emergency Department Provider Note    First MD Initiated Contact with Patient 02/05/19 2110     (approximate)  I have reviewed the triage vital signs and the nursing notes.   HISTORY  Chief Complaint Fall and Hip Pain    HPI April [name] is a 73 y.o. female [place] past medical history reportedly on Coumadin at home presents the ER for evaluation of right hip pain that occurred after the patient fell.  States that she was feeling her legs give out from under her while she was using her rolling walker.  Denies hitting her head.  Denies any chest pain or shortness of breath.  No nausea or vomiting.  States the pain is mild to moderate.  States that she feels a bit confused after receiving IV pain medication in route.    Past Medical History:  Diagnosis Date  . Acute renal failure (ARF) ([place])   . Hypertension   . NSTEMI (non-ST elevated myocardial infarction) ([place])   . Paroxysmal atrial fibrillation (HCC)   . Renal disorder   . Sepsis [facility]) January 2017   [facility]  . Type 2 diabetes mellitus (HCC)    Family History  Problem Relation Age of Onset  . Diabetes Father   . Heart disease Father   . Stroke Maternal Grandmother    Past Surgical History:  Procedure Laterality Date  . CYSTOSCOPY WITH STENT PLACEMENT Right 11/03/2015   Procedure: CYSTOSCOPY WITH STENT PLACEMENT;  Surgeon: Hollice Espy, MD;  Location: [facility] ORS;  Service: Urology;  Laterality: Right;  . FRACTURE SURGERY Right    Elbow  . TONSILLECTOMY    . 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 PAD (peripheral artery disease) ([place]) 10/03/2017  . Essential hypertension 10/03/2017  . Pain in limb 03/21/2017  . Swelling of limb 10/18/2016  . Ulcer of calf ([place]) 10/18/2016  . Gangrene of  foot ([place]) 03/03/2016  . UPJ (ureteropelvic junction) obstruction   . Paroxysmal atrial fibrillation ([place]) 12/04/2015  . Back pain 12/04/2015  . Thrombocytopenia ([place]) 12/03/2015  . Leukocytosis 12/03/2015  . Type 2 diabetes mellitus ([place]) 12/03/2015  . Respiratory failure ([place])   . Proteus septicemia ([place])   . Acute renal failure (ARF) ([place])   . Hydronephrosis   . Endotracheally intubated   . Respiratory distress   . Arterial hypotension   . Sepsis ([place]) 10/20/2015  . Acute renal failure ([place])   . Altered mental status   . NSTEMI (non-ST elevated myocardial infarction) ([place])   . Sepsis due to urinary tract infection ([place])       Prior to Admission medications   Medication Sig Start Date End Date Taking? Authorizing Provider  acetaminophen (TYLENOL) 500 MG tablet Take 1,000 mg by mouth every 8 (eight) hours as needed for moderate pain. Reported on 12/16/2015    [provider]  aspirin EC 81 MG EC tablet Take 1 tablet (81 mg total) by mouth daily. 03/04/16   Algernon Huxley, MD  diclofenac sodium (VOLTAREN) 1 % GEL Apply 4 g topically 4 (four) times daily.    [provider]  docusate sodium (COLACE) 100 MG capsule Take 1 capsule (100 mg total) by mouth 2 (two) times daily. 12/10/15   Dustin Flock, MD  insulin glargine (LANTUS) 100 UNIT/ML injection Inject  0.15 mLs (15 Units total) into the skin daily. Patient not taking: Reported on 10/03/2017 11/09/15   Henreitta Leber, MD  metFORMIN (GLUCOPHAGE) 500 MG tablet 2 (two) times daily. 09/13/17   [provider]  metoprolol tartrate (LOPRESSOR) 25 MG tablet Take 1 tablet (25 mg total) by mouth 2 (two) times daily. 11/09/15   Henreitta Leber, MD  oxyCODONE (OXY IR/ROXICODONE) 5 MG immediate release tablet Take 1 tablet (5 mg total) by mouth every 6 (six) hours as needed for moderate pain or severe pain. Patient not taking: Reported on 10/03/2017 03/04/16   Algernon Huxley, MD  polyethylene glycol [facility] / Floria Raveling)  packet Take 17 g by mouth daily. Patient not taking: Reported on 10/03/2017 12/10/15   Dustin Flock, MD  pravastatin (PRAVACHOL) 20 MG tablet  12/05/16   [provider]  tamsulosin (FLOMAX) 0.4 MG CAPS capsule Take 1 capsule (0.4 mg total) by mouth daily. 12/10/15   Dustin Flock, MD  traMADol Veatrice [place]bon) 50 MG tablet  12/05/16   [provider]  traZODone (DESYREL) 50 MG tablet Take 50 mg by mouth at bedtime.    [provider]    Allergies Prednisone    Social History Social History   Tobacco Use  . Smoking status: Never Smoker  . Smokeless tobacco: Never Used  Substance Use Topics  . Alcohol use: No    Alcohol/week: 0.0 standard drinks  . Drug use: No    Review of Systems Patient denies headaches, rhinorrhea, blurry vision, numbness, shortness of breath, chest pain, edema, cough, abdominal pain, nausea, vomiting, diarrhea, dysuria, fevers, rashes or hallucinations unless otherwise stated above in HPI. ____________________________________________   PHYSICAL EXAM:  VITAL SIGNS: Vitals:   02/05/19 2219 02/05/19 2230  BP:    Pulse:  (!) 122  Resp:  (!) 30  Temp:    SpO2: (!) 88% 96%    Constitutional: ill appearing, very ttp of right hip, protecting airway, confused Eyes: Conjunctivae are normal.  Head: Atraumatic. Nose: No congestion/rhinnorhea. Mouth/Throat: Mucous membranes are moist.   Neck: No stridor. Painless ROM.  Cardiovascular: tachycardia, regular rhythm. Grossly normal heart sounds.  Good peripheral circulation. Respiratory: tachypnea  No retractions. Lungs coarse bibasilar breathsounds. Gastrointestinal: Soft and nontender. No distention. No abdominal bruits. No CVA tenderness. Genitourinary: normal external genitalia Musculoskeletal: Post partial limitation in bilateral midfoot.  Very tender palpation of the right hip.  Neurovascular intact distally..  No joint effusions. Neurologic:   No gross focal neurologic deficits are  appreciated. No facial droop Skin:  Skin is warm, dry and intact. No rash noted. Psychiatric: Mood and affect are normal. Speech and behavior are normal.  ____________________________________________   LABS (all labs ordered are listed, but only abnormal results are displayed)  Results for orders placed or performed during the hospital encounter of 02/05/19 (from the past 24 hour(s))  SARS Coronavirus 2 (CEPHEID - Performed in [place] hospital lab), Hosp Order     Status: None   Collection Time: 02/05/19  9:21 PM  Result Value Ref Range   SARS Coronavirus 2 NEGATIVE NEGATIVE  Type and screen [place]     Status: None (Preliminary result)   Collection Time: 02/05/19  9:22 PM  Result Value Ref Range   ABO/RH(D) PENDING    Antibody Screen PENDING    Sample Expiration      02/08/2019,2359 Performed at [facility], [address] [place], [place] [address]   CBC with Differential  Status: Abnormal   Collection Time: 02/05/19  9:22 PM  Result Value Ref Range   WBC 35.1 (H) 4.0 - 10.5 K/uL   RBC 5.43 (H) 3.87 - 5.11 MIL/uL   Hemoglobin 16.4 (H) 12.0 - 15.0 g/dL   HCT 49.7 (H) 36.0 - 46.0 %   MCV 91.5 80.0 - 100.0 fL   MCH 30.2 26.0 - 34.0 pg   MCHC 33.0 30.0 - 36.0 g/dL   RDW 13.8 11.5 - 15.5 %   Platelets 209 150 - 400 K/uL   nRBC 0.0 0.0 - 0.2 %   Neutrophils Relative % 89 %   Neutro Abs 31.3 (H) 1.7 - 7.7 K/uL   Lymphocytes Relative 3 %   Lymphs Abs 0.9 0.7 - 4.0 K/uL   Monocytes Relative 6 %   Monocytes Absolute 2.3 (H) 0.1 - 1.0 K/uL   Eosinophils Relative 0 %   Eosinophils Absolute 0.1 0.0 - 0.5 K/uL   Basophils Relative 0 %   Basophils Absolute 0.1 0.0 - 0.1 K/uL   Smear Review HIDE    Immature Granulocytes 2 %   Abs Immature Granulocytes 0.56 (H) 0.00 - 0.07 K/uL  Comprehensive metabolic panel     Status: Abnormal   Collection Time: 02/05/19  9:22 PM  Result Value Ref Range   Sodium 136 135 - 145 mmol/L   Potassium 3.6  3.5 - 5.1 mmol/L   Chloride 96 (L) 98 - 111 mmol/L   CO2 24 22 - 32 mmol/L   Glucose, Bld 307 (H) 70 - 99 mg/dL   BUN 29 (H) 8 - 23 mg/dL   Creatinine, Ser 0.98 0.44 - 1.00 mg/dL   Calcium 9.0 8.9 - 10.3 mg/dL   Total Protein 8.0 6.5 - 8.1 g/dL   Albumin 3.5 3.5 - 5.0 g/dL   AST 68 (H) 15 - 41 U/L   ALT 42 0 - 44 U/L   Alkaline Phosphatase 94 38 - 126 U/L   Total Bilirubin 1.2 0.3 - 1.2 mg/dL   GFR calc non Af Amer 58 (L) >60 mL/min   GFR calc Af Amer >60 >60 mL/min   Anion gap 16 (H) 5 - 15  Urinalysis, Complete w Microscopic     Status: Abnormal   Collection Time: 02/05/19  9:22 PM  Result Value Ref Range   Color, Urine AMBER (A) YELLOW   APPearance CLOUDY (A) CLEAR   Specific Gravity, Urine 1.020 1.005 - 1.030   pH 5.0 5.0 - 8.0   Glucose, UA >=500 (A) NEGATIVE mg/dL   Hgb urine dipstick LARGE (A) NEGATIVE   Bilirubin Urine NEGATIVE NEGATIVE   Ketones, ur 20 (A) NEGATIVE mg/dL   Protein, ur 100 (A) NEGATIVE mg/dL   Nitrite NEGATIVE NEGATIVE   Leukocytes,Ua SMALL (A) NEGATIVE   RBC / HPF 6-10 0 - 5 RBC/hpf   WBC, UA >50 (H) 0 - 5 WBC/hpf   Bacteria, UA RARE (A) NONE SEEN   Squamous Epithelial / LPF 0-5 0 - 5   WBC Clumps PRESENT    Mucus PRESENT    Cellular Cast, UA PRESENT    Non Squamous Epithelial PRESENT (A) NONE SEEN  Protime-INR     Status: Abnormal   Collection Time: 02/05/19  9:22 PM  Result Value Ref Range   Prothrombin Time 15.6 (H) 11.4 - 15.2 seconds   INR 1.3 (H) 0.8 - 1.2  Troponin I - ONCE - STAT     Status: Abnormal   Collection Time: 02/05/19  9:22  PM  Result Value Ref Range   Troponin I 0.03 (HH) <0.03 ng/mL  Lactic acid, plasma     Status: Abnormal   Collection Time: 02/05/19  9:22 PM  Result Value Ref Range   Lactic Acid, Venous 3.1 (HH) 0.5 - 1.9 mmol/L  Procalcitonin     Status: None   Collection Time: 02/05/19  9:22 PM  Result Value Ref Range   Procalcitonin 1.93 ng/mL   ____________________________________________  EKG My review and  personal interpretation at Time: 21:20   Indication: fall  Rate: 115  Rhythm: sinus Axis: normal  Other: nonspeicifc st abn, occasional pac ____________________________________________  RADIOLOGY  I personally reviewed all radiographic images ordered to evaluate for the above acute complaints and reviewed radiology reports and findings.  These findings were personally discussed with the patient.  Please see medical record for radiology report. ____________________________________________   PROCEDURES  Procedure(s) performed:  .Critical Care Performed by: Merlyn Lot, MD Authorized by: Merlyn Lot, MD   Critical care provider statement:    Critical care time (minutes):  40   Critical care time was exclusive of:  Separately billable procedures and treating other patients   Critical care was necessary to treat or prevent imminent or life-threatening deterioration of the following conditions:  Sepsis   Critical care was time spent personally by me on the following activities:  Development of treatment plan with patient or surrogate, discussions with consultants, evaluation of patient's response to treatment, examination of patient, obtaining history from patient or surrogate, ordering and performing treatments and interventions, ordering and review of laboratory studies, ordering and review of radiographic studies, pulse oximetry, re-evaluation of patient's condition and review of old charts      Critical Care performed: yes ____________________________________________   INITIAL IMPRESSION / [place] / ED COURSE  Pertinent labs & imaging results that were available during my care of the patient were reviewed by me and considered in my medical decision making (see chart for details).   DDX: Fracture contusion, pneumonia, UTI, [place], bacteremia, COVID-19  KASIDEE VOISIN is a 73 y.o. who presents to the ED with what sounds like syncope or near syncopal episode  resulting in fall landing on her right hip.  Patient a little bit confused may be secondary to pain medication in route but is mildly tachycardic and describing generalized weakness certainly concerning for sepsis or syncope.  Blood work will be sent for the by differential.  X-ray does show evidence of right intertrochanteric fracture.  The patient will be placed on continuous pulse oximetry and telemetry for monitoring.  Laboratory evaluation will be sent to evaluate for the above complaints.     Clinical Course as of Feb 04 2318  Tue Feb 05, 2019  2226 Temperature is febrile.  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  Patient will be given broad-spectrum antibiotics given her history of MRSA cystitis.  Lactate is elevated however the patient is not hypotensive.  Will provide IV fluid resuscitation in small aliquots with reassessment.  Patient will be admitted to hospital for further medical management.  The patient was evaluated in Emergency Department today for the symptoms described in the history of present illness. He/she was evaluated in the context of the global COVID-19 pandemic, which necessitated consideration that the patient might be at risk  for infection with the SARS-CoV-2 virus that causes COVID-19. Institutional protocols and algorithms that pertain to the evaluation of patients at risk for COVID-19 are in a state of rapid change based on information released by regulatory bodies including the CDC and federal and state organizations. These policies and algorithms were followed during the patient's care in the ED.  As part of my medical decision making, I reviewed the following data within the [place] notes reviewed and  incorporated, Labs reviewed, notes from prior ED visits and [place] Controlled Substance Database   ____________________________________________   FINAL CLINICAL IMPRESSION(S) / ED DIAGNOSES  Final diagnoses:  Sepsis with encephalopathy without septic shock, due to unspecified organism ([place])  Closed displaced intertrochanteric fracture of right femur, initial encounter ([place])      NEW MEDICATIONS STARTED DURING THIS VISIT:  New Prescriptions   No medications on file     Note:  This document was prepared using Dragon voice recognition software and may include unintentional dictation errors.    Merlyn Lot, MD 02/05/19 [id number]

## 2019-02-06 ENCOUNTER — Encounter: Payer: Self-pay | Admitting: Neurology

## 2019-02-06 ENCOUNTER — Other Ambulatory Visit: Payer: Self-pay

## 2019-02-06 LAB — CBC
HCT: 44.5 % (ref 36.0–46.0)
Hemoglobin: 14.6 g/dL (ref 12.0–15.0)
MCH: 29.8 pg (ref 26.0–34.0)
MCHC: 32.8 g/dL (ref 30.0–36.0)
MCV: 90.8 fL (ref 80.0–100.0)
Platelets: 207 10*3/uL (ref 150–400)
RBC: 4.9 MIL/uL (ref 3.87–5.11)
RDW: 13.9 % (ref 11.5–15.5)
WBC: 33.6 10*3/uL — ABNORMAL HIGH (ref 4.0–10.5)
nRBC: 0 % (ref 0.0–0.2)

## 2019-02-06 LAB — BASIC METABOLIC PANEL
Anion gap: 10 (ref 5–15)
BUN: 29 mg/dL — ABNORMAL HIGH (ref 8–23)
CO2: 25 mmol/L (ref 22–32)
Calcium: 8.2 mg/dL — ABNORMAL LOW (ref 8.9–10.3)
Chloride: 101 mmol/L (ref 98–111)
Creatinine, Ser: 0.96 mg/dL (ref 0.44–1.00)
GFR calc Af Amer: >60 mL/min (ref >60)
GFR calc non Af Amer: 59 mL/min — ABNORMAL LOW (ref >60)
Glucose, Bld: 283 mg/dL — ABNORMAL HIGH (ref 70–99)
Potassium: 3.5 mmol/L (ref 3.5–5.1)
Sodium: 136 mmol/L (ref 135–145)

## 2019-02-06 LAB — GLUCOSE, CAPILLARY
Glucose-Capillary: 170 mg/dL — ABNORMAL HIGH (ref 70–99)
Glucose-Capillary: 233 mg/dL — ABNORMAL HIGH (ref 70–99)
Glucose-Capillary: 250 mg/dL — ABNORMAL HIGH (ref 70–99)
Glucose-Capillary: 250 mg/dL — ABNORMAL HIGH (ref 70–99)
Glucose-Capillary: 258 mg/dL — ABNORMAL HIGH (ref 70–99)
Glucose-Capillary: 271 mg/dL — ABNORMAL HIGH (ref 70–99)

## 2019-02-06 LAB — LACTIC ACID, PLASMA: Lactic Acid, Venous: 1.5 mmol/L (ref 0.5–1.9)

## 2019-02-06 LAB — HEMOGLOBIN A1C
Hgb A1c MFr Bld: 6.7 % — ABNORMAL HIGH (ref 4.8–5.6)
Mean Plasma Glucose: 145.59 mg/dL

## 2019-02-06 LAB — MRSA PCR SCREENING: MRSA by PCR: POSITIVE — AB

## 2019-02-06 LAB — TYPE AND SCREEN
ABO/RH(D): A POS
Antibody Screen: NEGATIVE

## 2019-02-06 LAB — MAGNESIUM: Magnesium: 2.3 mg/dL (ref 1.7–2.4)

## 2019-02-06 MED ORDER — ENOXAPARIN SODIUM 40 MG/0.4ML ~~LOC~~ SOLN
40.0000 mg | SUBCUTANEOUS | Status: AC
  Administered 2019-02-06: 40 mg via SUBCUTANEOUS
  Filled 2019-02-06: qty 0.4

## 2019-02-06 MED ORDER — OXYCODONE HCL 5 MG PO TABS
5.0000 mg | ORAL_TABLET | Freq: Four times a day (QID) | ORAL | Status: DC | PRN
  Administered 2019-02-06 (×2): 5 mg via ORAL
  Filled 2019-02-06 (×2): qty 1

## 2019-02-06 MED ORDER — TAMSULOSIN HCL 0.4 MG PO CAPS
0.4000 mg | ORAL_CAPSULE | Freq: Every day | ORAL | Status: DC
  Filled 2019-02-06 (×4): qty 1

## 2019-02-06 MED ORDER — METHOCARBAMOL 1000 MG/10ML IJ SOLN
500.0000 mg | Freq: Four times a day (QID) | INTRAVENOUS | Status: DC | PRN
  Filled 2019-02-06: qty 5

## 2019-02-06 MED ORDER — LABETALOL HCL 5 MG/ML IV SOLN: 20.0000 mg | INTRAVENOUS | Status: DC | PRN

## 2019-02-06 MED ORDER — METOPROLOL TARTRATE 25 MG PO TABS
25.0000 mg | ORAL_TABLET | Freq: Two times a day (BID) | ORAL | Status: DC
  Administered 2019-02-06 – 2019-02-12 (×12): 25 mg via ORAL
  Filled 2019-02-06 (×12): qty 1

## 2019-02-06 MED ORDER — POLYETHYLENE GLYCOL 3350 17 G PO PACK: 17.0000 g | PACK | Freq: Every day | ORAL | Status: DC

## 2019-02-06 MED ORDER — DICLOFENAC SODIUM 1 % TD GEL
4.0000 g | Freq: Four times a day (QID) | TRANSDERMAL | Status: DC
  Administered 2019-02-06 – 2019-02-12 (×12): 4 g via TOPICAL
  Filled 2019-02-06 (×2): qty 100

## 2019-02-06 MED ORDER — PRAVASTATIN SODIUM 20 MG PO TABS
20.0000 mg | ORAL_TABLET | Freq: Every day | ORAL | Status: DC
  Administered 2019-02-06 – 2019-02-12 (×6): 20 mg via ORAL
  Filled 2019-02-06 (×6): qty 1

## 2019-02-06 MED ORDER — TRAZODONE HCL 50 MG PO TABS
50.0000 mg | ORAL_TABLET | Freq: Every day | ORAL | Status: DC
  Administered 2019-02-06 – 2019-02-11 (×6): 50 mg via ORAL
  Filled 2019-02-06 (×6): qty 1

## 2019-02-06 MED ORDER — MUPIROCIN 2 % EX OINT
1.0000 "application " | TOPICAL_OINTMENT | Freq: Two times a day (BID) | CUTANEOUS | Status: AC
  Administered 2019-02-06 – 2019-02-10 (×5): 1 via NASAL
  Filled 2019-02-06 (×2): qty 22

## 2019-02-06 MED ORDER — CHLORHEXIDINE GLUCONATE CLOTH 2 % EX PADS
6.0000 | MEDICATED_PAD | Freq: Every day | CUTANEOUS | Status: AC
  Administered 2019-02-06 – 2019-02-09 (×4): 6 via TOPICAL

## 2019-02-06 MED ORDER — INSULIN GLARGINE 100 UNIT/ML ~~LOC~~ SOLN
8.0000 [IU] | Freq: Every day | SUBCUTANEOUS | Status: DC
  Administered 2019-02-06 – 2019-02-08 (×2): 8 [IU] via SUBCUTANEOUS
  Filled 2019-02-06 (×3): qty 0.08

## 2019-02-06 MED ORDER — CEFAZOLIN SODIUM-DEXTROSE 2-4 GM/100ML-% IV SOLN
2.0000 g | INTRAVENOUS | Status: DC
  Filled 2019-02-06: qty 100

## 2019-02-06 MED ORDER — INSULIN ASPART 100 UNIT/ML ~~LOC~~ SOLN
0.0000 [IU] | SUBCUTANEOUS | Status: DC
  Administered 2019-02-06: 21:00:00 3 [IU] via SUBCUTANEOUS
  Administered 2019-02-06: 5 [IU] via SUBCUTANEOUS
  Administered 2019-02-06: 2 [IU] via SUBCUTANEOUS
  Administered 2019-02-06: 05:00:00 5 [IU] via SUBCUTANEOUS
  Administered 2019-02-06 (×2): 3 [IU] via SUBCUTANEOUS
  Administered 2019-02-07 (×2): 9 [IU] via SUBCUTANEOUS
  Administered 2019-02-07: 08:00:00 2 [IU] via SUBCUTANEOUS
  Administered 2019-02-07: 05:00:00 1 [IU] via SUBCUTANEOUS
  Administered 2019-02-08 (×2): 5 [IU] via SUBCUTANEOUS
  Administered 2019-02-08: 3 [IU] via SUBCUTANEOUS
  Filled 2019-02-06 (×13): qty 1

## 2019-02-06 MED ORDER — VANCOMYCIN HCL 1.25 G IV SOLR
1250.0000 mg | INTRAVENOUS | Status: DC
  Administered 2019-02-06: 1250 mg via INTRAVENOUS
  Filled 2019-02-06 (×2): qty 1250

## 2019-02-06 MED ORDER — POTASSIUM CHLORIDE 20 MEQ PO PACK
40.0000 meq | PACK | Freq: Once | ORAL | Status: AC
  Administered 2019-02-06: 40 meq via ORAL
  Filled 2019-02-06: qty 2

## 2019-02-06 MED ORDER — VANCOMYCIN HCL 10 G IV SOLR: 1250.0000 mg | INTRAVENOUS | Status: DC

## 2019-02-06 MED ORDER — DOCUSATE SODIUM 100 MG PO CAPS
100.0000 mg | ORAL_CAPSULE | Freq: Two times a day (BID) | ORAL | Status: DC
  Administered 2019-02-06 (×2): 100 mg via ORAL
  Filled 2019-02-06 (×2): qty 1

## 2019-02-06 MED ORDER — METHOCARBAMOL 500 MG PO TABS
500.0000 mg | ORAL_TABLET | Freq: Four times a day (QID) | ORAL | Status: DC | PRN
  Filled 2019-02-06: qty 1

## 2019-02-06 MED ORDER — MORPHINE SULFATE (PF) 2 MG/ML IV SOLN: 2.0000 mg | INTRAVENOUS | Status: DC | PRN

## 2019-02-06 NOTE — Progress Notes (Signed)
Inpatient Diabetes Program Recommendations  AACE/ADA: New Consensus Statement on Inpatient Glycemic Control (2015)  Target Ranges:  Prepandial:   less than 140 mg/dL      Peak postprandial:   less than 180 mg/dL (1-2 hours)      Critically ill patients:  140 - 180 mg/dL   Lab Results  Component Value Date   GLUCAP 271 (H) 02/06/2019   HGBA1C 6.7 (H) 02/06/2019    Review of Glycemic Control Results for April, [name] (MRN [id number]) as of 02/06/2019 12:50  Ref. Range 02/06/2019 04:43 02/06/2019 09:16 02/06/2019 11:42  Glucose-Capillary Latest Ref Range: 70 - 99 mg/dL 258 (H) 233 (H) 271 (H)   Diabetes history: DM 2 Outpatient Diabetes medications:  Lantus 15 units daily, Metformin 500 mg bid Current orders for Inpatient glycemic control:  Novolog sensitive q 4 hours, Lantus 8 units daily  Inpatient Diabetes Program Recommendations:   Please consider increasing Lantus to 14 units daily.   Thanks,  Adah Perl, RN, BC-ADM Inpatient Diabetes Coordinator Pager [phone] (8a-5p)

## 2019-02-06 NOTE — Consult Note (Signed)
Pharmacy Antibiotic Note  April [name] is a 73 y.o. female admitted on 02/05/2019 with sepsis.  Pharmacy has been consulted for cefepime and vancomycin dosing.  Plan: Vancomycin 2000mg  IV x 1 dose, followed by Vancomycin 1250 mg IV Q 24 hrs. Goal AUC 400-550. Expected AUC: 474 SCr used: 0.98  Start cefepime 2g IV every 8 hours    Height: 5\' 6"  (167.6 cm) Weight: 260 lb (117.9 kg) IBW/kg (Calculated) : 59.3  Temp (24hrs), Avg:98.2 F (36.8 C), Min:98.2 F (36.8 C), Max:98.2 F (36.8 C)  Recent Labs  Lab 02/05/19 2122  WBC 35.1*  CREATININE 0.98  LATICACIDVEN 3.1*    Estimated Creatinine Clearance: 67.7 mL/min (by C-G formula based on SCr of 0.98 mg/dL).    Allergies  Allergen Reactions  . Prednisone Anaphylaxis    Antimicrobials this admission: 5/12 vancomycin >>  5/12 cefepime  >>   Dose adjustments this admission:  Microbiology results: 5/12 BCx: pending  5/12 UCx: pending  5/12 SARS Coronavirus 2: negative   Thank you for allowing pharmacy to be a part of this patient's care.  Pernell Dupre, PharmD, BCPS Clinical Pharmacist 02/06/2019 12:06 AM

## 2019-02-06 NOTE — ED Notes (Signed)
ED TO INPATIENT HANDOFF REPORT  ED Nurse Name and Phone #:  Anson Crofts Name/Age/Gender April [name] 73 y.o. female Room/Bed: ED05A/ED05A  Code Status   Code Status: Full Code  Home/SNF/Other Home Patient oriented to: self Is this baseline? No      Chief Complaint Fall  AMS  Triage Note Pt lives at home alone and had a fall today and hit right hip. Pain 10/10 pt also with strong smell of urine   Allergies Allergies  Allergen Reactions  . Prednisone Anaphylaxis    Level of Care/Admitting Diagnosis ED Disposition    ED Disposition Condition [facility]: [place] [[id number]]  Level of Care: Med-Surg [16]  Covid Evaluation: N/A  Diagnosis: S/P right hip fracture [[id number]]  Admitting Physician: Christel Mormon [[id number]]  Attending Physician: Christel Mormon [[id number]]  Estimated length of stay: 3 - 4 days  Certification:: I certify this patient will need inpatient services for at least 2 midnights  PT Class (Do Not Modify): Inpatient [101]  PT Acc Code (Do Not Modify): Private [1]       B Medical/Surgery History Past Medical History:  Diagnosis Date  . Acute renal failure (ARF) ([place])   . Hypertension   . NSTEMI (non-ST elevated myocardial infarction) ([place])   . Paroxysmal atrial fibrillation (HCC)   . Renal disorder   . Sepsis [facility]) January 2017   [facility]  . Type 2 diabetes mellitus ([place])    Past Surgical History:  Procedure Laterality Date  . CYSTOSCOPY WITH STENT PLACEMENT Right 11/03/2015   Procedure: CYSTOSCOPY WITH STENT PLACEMENT;  Surgeon: Hollice Espy, MD;  Location: [facility] ORS;  Service: Urology;  Laterality: Right;  . FRACTURE SURGERY Right    Elbow  . TONSILLECTOMY    . 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   02/05/19    2225    Latex   1   Incision (Closed) 03/03/16 Foot Bilateral   03/03/16    1322     1070          Intake/Output Last 24 hours No intake or output data in the 24 hours ending 02/06/19 0006  Labs/Imaging Results for orders placed or performed during the hospital encounter of 02/05/19 (from the past 48 hour(s))  SARS Coronavirus 2 (CEPHEID - Performed in [place] hospital lab), Hosp Order     Status: None   Collection Time: 02/05/19  9:21 PM  Result Value Ref Range   SARS Coronavirus 2 NEGATIVE NEGATIVE    Comment: (NOTE) If result is NEGATIVE SARS-CoV-2 target nucleic acids are NOT DETECTED. The SARS-CoV-2 RNA is generally detectable in upper and lower  respiratory specimens during the acute phase of infection. The lowest  concentration of SARS-CoV-2 viral copies this assay  can detect is 250  copies / mL. A negative result does not preclude SARS-CoV-2 infection  and should not be used as the sole basis for treatment or other  patient management decisions.  A negative result may occur with  improper specimen collection / handling, submission of specimen other  than nasopharyngeal swab, presence of viral mutation(s) within the  areas targeted by this assay, and inadequate number of viral copies  (<250 copies / mL). A negative result must be combined with clinical  observations, patient history, and epidemiological information. If result is POSITIVE SARS-CoV-2 target nucleic acids are DETECTED. The SARS-CoV-2 RNA is generally detectable in upper and lower  respiratory specimens dur ing the acute phase of infection.   Positive  results are indicative of active infection with SARS-CoV-2.  Clinical  correlation with patient history and other diagnostic information is  necessary to determine patient infection status.  Positive results do  not rule out bacterial infection or co-infection with other viruses. If result is PRESUMPTIVE POSTIVE SARS-CoV-2 nucleic acids MAY BE PRESENT.   A presumptive positive result was obtained on the submitted specimen  and confirmed on repeat testing.  While 2019 novel coronavirus  (SARS-CoV-2) nucleic acids may be present in the submitted sample  additional confirmatory testing may be necessary for epidemiological  and / or clinical management purposes  to differentiate between  SARS-CoV-2 and other Sarbecovirus currently known to infect humans.  If clinically indicated additional testing with an alternate test  methodology [phone]) is advised. The SARS-CoV-2 RNA is generally  detectable in upper and lower respiratory sp ecimens during the acute  phase of infection. The expected result is Negative. Fact Sheet for Patients:  StrictlyIdeas.no Fact Sheet for Healthcare Providers: BankingDealers.co.za This test is not yet approved or cleared by the [place] FDA and has been authorized for detection and/or diagnosis of SARS-CoV-2 by FDA under an Emergency Use Authorization (EUA).  This EUA will remain in effect (meaning this test can be used) for the duration of the COVID-19 declaration under Section 564(b)(1) of the Act, 21 U.S.C. section 360bbb-3(b)(1), unless the authorization is terminated or revoked sooner. Performed at [facility], [address] [place], [place] [address]   Type and screen [place]     Status: None (Preliminary result)   Collection Time: 02/05/19  9:22 PM  Result Value Ref Range   ABO/RH(D) PENDING    Antibody Screen PENDING    Sample Expiration       02/08/2019,2359 Performed at [facility], [address] [place], [place] [address]   CBC with Differential     Status: Abnormal   Collection Time: 02/05/19  9:22 PM  Result Value Ref Range   WBC 35.1 (H) 4.0 - 10.5 K/uL   RBC 5.43 (H) 3.87 - 5.11 MIL/uL   Hemoglobin 16.4 (H) 12.0 - 15.0 g/dL   HCT 49.7 (H) 36.0 - 46.0 %   MCV 91.5 80.0 - 100.0 fL   MCH 30.2 26.0 - 34.0 pg   MCHC 33.0 30.0 - 36.0 g/dL   RDW 13.8 11.5 - 15.5 %   Platelets 209 150 - 400 K/uL   nRBC 0.0 0.0 - 0.2 %   Neutrophils Relative % 89 %   Neutro Abs 31.3 (H) 1.7 - 7.7 K/uL   Lymphocytes Relative 3 %   Lymphs Abs 0.9 0.7 - 4.0 K/uL   Monocytes Relative 6 %   Monocytes Absolute 2.3 (H) 0.1 - 1.0 K/uL  Eosinophils Relative 0 %   Eosinophils Absolute 0.1 0.0 - 0.5 K/uL   Basophils Relative 0 %   Basophils Absolute 0.1 0.0 - 0.1 K/uL   Smear Review HIDE    Immature Granulocytes 2 %   Abs Immature Granulocytes 0.56 (H) 0.00 - 0.07 K/uL    Comment: Performed at [facility], [address] [place], [place] [address]  Comprehensive metabolic panel     Status: Abnormal   Collection Time: 02/05/19  9:22 PM  Result Value Ref Range   Sodium 136 135 - 145 mmol/L   Potassium 3.6 3.5 - 5.1 mmol/L    Comment: HEMOLYSIS AT THIS LEVEL MAY AFFECT RESULT   Chloride 96 (L) 98 - 111 mmol/L   CO2 24 22 - 32 mmol/L   Glucose, Bld 307 (H) 70 - 99 mg/dL   BUN 29 (H) 8 - 23 mg/dL   Creatinine, Ser 0.98 0.44 - 1.00 mg/dL   Calcium 9.0 8.9 - 10.3 mg/dL   Total Protein 8.0 6.5 - 8.1 g/dL   Albumin 3.5 3.5 - 5.0 g/dL   AST 68 (H) 15 - 41 U/L   ALT 42 0 - 44 U/L   Alkaline Phosphatase 94 38 - 126 U/L   Total Bilirubin 1.2 0.3 - 1.2 mg/dL   GFR calc non Af Amer 58 (L) >60 mL/min   GFR calc Af Amer >60 >60 mL/min   Anion gap 16 (H) 5 - 15    Comment: Performed at [facility], [address] [place], [place] [address]  Urinalysis, Complete w Microscopic     Status: Abnormal   Collection  Time: 02/05/19  9:22 PM  Result Value Ref Range   Color, Urine AMBER (A) YELLOW    Comment: BIOCHEMICALS MAY BE AFFECTED BY COLOR   APPearance CLOUDY (A) CLEAR   Specific Gravity, Urine 1.020 1.005 - 1.030   pH 5.0 5.0 - 8.0   Glucose, UA >=500 (A) NEGATIVE mg/dL   Hgb urine dipstick LARGE (A) NEGATIVE   Bilirubin Urine NEGATIVE NEGATIVE   Ketones, ur 20 (A) NEGATIVE mg/dL   Protein, ur 100 (A) NEGATIVE mg/dL   Nitrite NEGATIVE NEGATIVE   Leukocytes,Ua SMALL (A) NEGATIVE   RBC / HPF 6-10 0 - 5 RBC/hpf   WBC, UA >50 (H) 0 - 5 WBC/hpf   Bacteria, UA RARE (A) NONE SEEN   Squamous Epithelial / LPF 0-5 0 - 5   WBC Clumps PRESENT    Mucus PRESENT    Cellular Cast, UA PRESENT    Non Squamous Epithelial PRESENT (A) NONE SEEN    Comment: Performed at [facility], [address] [place], [place] [address]  Protime-INR     Status: Abnormal   Collection Time: 02/05/19  9:22 PM  Result Value Ref Range   Prothrombin Time 15.6 (H) 11.4 - 15.2 seconds   INR 1.3 (H) 0.8 - 1.2    Comment: (NOTE) INR goal varies based on device and disease states. Performed at [facility], [address] [place], [place] [address]   Troponin I - ONCE - STAT     Status: Abnormal   Collection Time: 02/05/19  9:22 PM  Result Value Ref Range   Troponin I 0.03 (HH) <0.03 ng/mL    Comment: CRITICAL RESULT CALLED TO, READ BACK BY AND VERIFIED WITH RAQUEL DAVID @2258  02/05/19 MJU Performed at [facility], [address] [place], [place] [address]   Lactic acid, plasma  Status: Abnormal   Collection Time: 02/05/19  9:22 PM  Result Value Ref Range   Lactic Acid, Venous 3.1 (HH) 0.5 - 1.9 mmol/L    Comment: CRITICAL RESULT CALLED TO, READ BACK BY AND VERIFIED WITH [place] DAILEY @2304  01/26/19 MJU Performed at [facility], [address] [place], [place] [address]   Procalcitonin     Status: None   Collection Time: 02/05/19  9:22 PM  Result Value Ref Range    Procalcitonin 1.93 ng/mL    Comment:        Interpretation: PCT > 0.5 ng/mL and <= 2 ng/mL: Systemic infection (sepsis) is possible, but other conditions are known to elevate PCT as well. (NOTE)       Sepsis PCT Algorithm           Lower Respiratory Tract                                      Infection PCT Algorithm    ----------------------------     ----------------------------         PCT < 0.25 ng/mL                PCT < 0.10 ng/mL         Strongly encourage             Strongly discourage   discontinuation of antibiotics    initiation of antibiotics    ----------------------------     -----------------------------       PCT 0.25 - 0.50 ng/mL            PCT 0.10 - 0.25 ng/mL               OR       >80% decrease in PCT            Discourage initiation of                                            antibiotics      Encourage discontinuation           of antibiotics    ----------------------------     -----------------------------         PCT >= 0.50 ng/mL              PCT 0.26 - 0.50 ng/mL                AND       <80% decrease in PCT             Encourage initiation of                                             antibiotics       Encourage continuation           of antibiotics    ----------------------------     -----------------------------        PCT >= 0.50 ng/mL                  PCT > 0.50 ng/mL               AND  increase in PCT                  Strongly encourage                                      initiation of antibiotics    Strongly encourage escalation           of antibiotics                                     -----------------------------                                           PCT <= 0.25 ng/mL                                                 OR                                        > 80% decrease in PCT                                     Discontinue / Do not initiate                                             antibiotics Performed at [facility], [address] [place], [place] [address]   Type and screen Ordered by PROVIDER DEFAULT     Status: None (Preliminary result)   Collection Time: 02/05/19 10:17 PM  Result Value Ref Range   ABO/RH(D) PENDING    Antibody Screen PENDING    Sample Expiration      02/08/2019,2359 Performed at [facility], [address], [place], [place] [address]    Ct Head Wo Contrast  Result Date: 02/05/2019 CLINICAL DATA:  Status post fall today. EXAM: CT HEAD WITHOUT CONTRAST TECHNIQUE: Contiguous axial images were obtained from the base of the skull through the vertex without intravenous contrast. COMPARISON:  Head CT scan 10/27/2015. FINDINGS: Brain: No evidence of acute infarction, hemorrhage, hydrocephalus, extra-axial collection or mass lesion/mass effect. Vascular: No hyperdense vessel or unexpected calcification. Skull: Intact.  No focal lesion. Sinuses/Orbits: Negative. Other: None. IMPRESSION: Negative head CT. Electronically Signed   By: Inge Rise M.D.   On: 02/05/2019 23:09   Dg Chest Portable 1 View  Result Date: 02/05/2019 CLINICAL DATA:  Pain following fall EXAM: PORTABLE CHEST 1 VIEW COMPARISON:  December 04, 2015 FINDINGS: There is no edema or consolidation. The heart size and pulmonary vascularity are normal. No adenopathy. There is aortic atherosclerosis. No evident pneumothorax. Bones are osteoporotic. There old healed rib fractures on the left. No acute fracture evident. IMPRESSION: No edema or consolidation. Stable cardiac silhouette. Bones osteoporotic with old healed rib fractures on the left. Aortic Atherosclerosis (  ICD10-I70.0). Electronically Signed   By: Lowella Grip III M.D.   On: 02/05/2019 21:58   Dg Hip Unilat W Or Wo Pelvis 2-3 Views Right  Result Date: 02/05/2019 CLINICAL DATA:  Fall with hip pain. EXAM: DG HIP (WITH OR WITHOUT PELVIS) 2-3V RIGHT COMPARISON:  Abdominal radiographs 12/09/2015 FINDINGS: There is an acute comminuted  intertrochanteric right femur fracture which is mildly displaced. The femoral head remains approximated with the acetabulum. Moderate right and mild left hip osteoarthrosis is again seen. IMPRESSION: Acute intertrochanteric right femur fracture. Electronically Signed   By: Logan Bores M.D.   On: 02/05/2019 21:57    Pending Labs Unresulted Labs (From admission, onward)    Start     Ordered   02/06/19 0258  Basic metabolic panel  Tomorrow morning,   STAT     02/05/19 2358   02/06/19 0500  CBC  Tomorrow morning,   STAT     02/05/19 2358   02/05/19 2349  Lactic acid, plasma  ONCE - STAT,   STAT     02/05/19 2358   02/05/19 2226  Urine culture  ONCE - STAT,   STAT     02/05/19 2226   02/05/19 2225  Blood culture (routine x 2)  BLOOD CULTURE X 2,   STAT     02/05/19 2225          Vitals/Pain Today's Vitals   02/05/19 2119 02/05/19 2219 02/05/19 2230 02/06/19 0000  BP: (!) 156/80     Pulse: (!) 112  (!) 122 (!) 117  Resp: (!) 24  (!) 30 (!) 29  Temp:      TempSrc:      SpO2: 92% (!) 88% 96% 97%  Weight:      Height:      PainSc:        Isolation Precautions No active isolations  Medications Medications  fentaNYL (SUBLIMAZE) injection 50 mcg (50 mcg Intravenous Given 02/05/19 2216)  vancomycin (VANCOCIN) 2,000 mg in sodium chloride 0.9 % 500 mL IVPB (2,000 mg Intravenous New Bag/Given 02/05/19 2322)  0.9 %  sodium chloride infusion (has no administration in time range)  acetaminophen (TYLENOL) tablet 650 mg (has no administration in time range)    Or  acetaminophen (TYLENOL) suppository 650 mg (has no administration in time range)  ketorolac (TORADOL) 30 MG/ML injection 15 mg (has no administration in time range)  magnesium hydroxide (MILK OF MAGNESIA) suspension 30 mL (has no administration in time range)  ondansetron (ZOFRAN) tablet 4 mg (has no administration in time range)    Or  ondansetron (ZOFRAN) injection 4 mg (has no administration in time range)  ceFEPIme  (MAXIPIME) 2 g in sodium chloride 0.9 % 100 mL IVPB (has no administration in time range)  vancomycin (VANCOCIN) 1,250 mg in sodium chloride 0.9 % 250 mL IVPB (has no administration in time range)  ceFEPIme (MAXIPIME) 2 g in sodium chloride 0.9 % 100 mL IVPB (0 g Intravenous Stopped 02/06/19 0005)  sodium chloride 0.9 % bolus 1,000 mL (1,000 mLs Intravenous New Bag/Given 02/05/19 2324)    Mobility walks with device High fall risk   Focused Assessments orthopedic, UTI sepsis   R Recommendations: See Admitting Provider Note  Report given to:   Additional Notes:  Lives home alone

## 2019-02-06 NOTE — Progress Notes (Signed)
[place] [facility] [place] NAME: April [name]    MR#:  [id number]  DATE OF BIRTH:  January 11, 1946  SUBJECTIVE: Admitted for fall, right hip fracture also found to have UTI.  Complains of right hip pain.  CHIEF COMPLAINT:   Chief Complaint  Patient presents with  . Fall  . Hip Pain    REVIEW OF SYSTEMS:   ROS CONSTITUTIONAL: No fever, fatigue or weakness.  EYES: No blurred or double vision.  EARS, NOSE, AND THROAT: No tinnitus or ear pain.  RESPIRATORY: No cough, shortness of breath, wheezing or hemoptysis.  CARDIOVASCULAR: No chest pain, orthopnea, edema.  GASTROINTESTINAL: No nausea, vomiting, diarrhea or abdominal pain.  GENITOURINARY: No dysuria, hematuria.  ENDOCRINE: No polyuria, nocturia,  HEMATOLOGY: No anemia, easy bruising or bleeding SKIN: No rash or lesion. MUSCULOSKELETAL right hip pain  NEUROLOGIC: No tingling, numbness, weakness.  PSYCHIATRY: No anxiety or depression.   DRUG ALLERGIES:   Allergies  Allergen Reactions  . Prednisone Anaphylaxis    VITALS:  Blood pressure (!) 156/76, pulse (!) 110, temperature (!) 96.3 F (35.7 C), temperature source Axillary, resp. rate 18, height 5\' 6"  (1.676 m), weight 117.9 kg, SpO2 98 %.  PHYSICAL EXAMINATION:  GENERAL:  73 y.o.-year-old patient lying in the bed with no acute distress.  EYES: Pupils equal, round, reactive to light and accommodation. No scleral icterus. Extraocular muscles intact.  HEENT: Head atraumatic, normocephalic. Oropharynx and nasopharynx clear.  NECK:  Supple, no jugular venous distention. No thyroid enlargement, no tenderness.  LUNGS: Normal breath sounds bilaterally, no wheezing, rales,rhonchi or crepitation. No use of accessory muscles of respiration.  CARDIOVASCULAR: S1, S2 normal. No murmurs, rubs, or gallops.  ABDOMEN: Soft, nontender, nondistended. Bowel sounds present. No organomegaly or mass.  EXTREMITIES: No pedal edema, cyanosis, or clubbing.   NEUROLOGIC: Cranial nerves II through XII are intact. Muscle strength 5/5 in all extremities. Sensation intact. Gait not checked.  PSYCHIATRIC: The patient is alert and oriented x 3.  SKIN: No obvious rash, lesion, or ulcer.    LABORATORY PANEL:   CBC Recent Labs  Lab 02/06/19 0202  WBC 33.6*  HGB 14.6  HCT 44.5  PLT 207   ------------------------------------------------------------------------------------------------------------------  Chemistries  Recent Labs  Lab 02/05/19 2122 02/06/19 0202 02/06/19 0701  NA 136 136  --   K 3.6 3.5  --   CL 96* 101  --   CO2 24 25  --   GLUCOSE 307* 283*  --   BUN 29* 29*  --   CREATININE 0.98 0.96  --   CALCIUM 9.0 8.2*  --   MG  --   --  2.3  AST 68*  --   --   ALT 42  --   --   ALKPHOS 94  --   --   BILITOT 1.2  --   --    ------------------------------------------------------------------------------------------------------------------  Cardiac Enzymes Recent Labs  Lab 02/05/19 2122  TROPONINI 0.03*   ------------------------------------------------------------------------------------------------------------------  RADIOLOGY:  Ct Head Wo Contrast  Result Date: 02/05/2019 CLINICAL DATA:  Status post fall today. EXAM: CT HEAD WITHOUT CONTRAST TECHNIQUE: Contiguous axial images were obtained from the base of the skull through the vertex without intravenous contrast. COMPARISON:  Head CT scan 10/27/2015. FINDINGS: Brain: No evidence of acute infarction, hemorrhage, hydrocephalus, extra-axial collection or mass lesion/mass effect. Vascular: No hyperdense vessel or unexpected calcification. Skull: Intact.  No focal lesion. Sinuses/Orbits: Negative. Other: None. IMPRESSION: Negative head CT. Electronically Signed  By: Inge Rise M.D.   On: 02/05/2019 23:09   Dg Chest Portable 1 View  Result Date: 02/05/2019 CLINICAL DATA:  Pain following fall EXAM: PORTABLE CHEST 1 VIEW COMPARISON:  December 04, 2015 FINDINGS: There is no  edema or consolidation. The heart size and pulmonary vascularity are normal. No adenopathy. There is aortic atherosclerosis. No evident pneumothorax. Bones are osteoporotic. There old healed rib fractures on the left. No acute fracture evident. IMPRESSION: No edema or consolidation. Stable cardiac silhouette. Bones osteoporotic with old healed rib fractures on the left. Aortic Atherosclerosis (ICD10-I70.0). Electronically Signed   By: Lowella Grip III M.D.   On: 02/05/2019 21:58   Dg Hip Unilat W Or Wo Pelvis 2-3 Views Right  Result Date: 02/05/2019 CLINICAL DATA:  Fall with hip pain. EXAM: DG HIP (WITH OR WITHOUT PELVIS) 2-3V RIGHT COMPARISON:  Abdominal radiographs 12/09/2015 FINDINGS: There is an acute comminuted intertrochanteric right femur fracture which is mildly displaced. The femoral head remains approximated with the acetabulum. Moderate right and mild left hip osteoarthrosis is again seen. IMPRESSION: Acute intertrochanteric right femur fracture. Electronically Signed   By: Logan Bores M.D.   On: 02/05/2019 21:57    EKG:   Orders placed or performed during the hospital encounter of 02/05/19  . ED EKG  . ED EKG    ASSESSMENT AND PLAN:   Sepsis present on admission secondary to UTI, continue cefepime, vancomycin, follow blood and urine cultures.  Leukocytosis is improving, lactic acid also is decreasing.  Patient MRSA PCR is positive and she is on contact precautions.  19 test has been negative in the emergency room.  #2/.  Hypertensive urgency secondary to right hip pain, BP better today. #3/diabetes mellitus type 2, hold metformin because of lactic acidosis, decrease the dose of Lantus in preparation for surgery and n.p.o. status. 4.  History of peripheral artery disease, followed by vascular surgery . #5 right hip fracture, patient scheduled for right hip surgery tomorrow, medically moderate moderate risk for surgery but surgery can be performed. 6.  Wean off oxygen as  tolerated.  7 slightly elevated troponin secondary to sepsis.  More than 50% time spent in counseling, coordination of care.  .  All the records are reviewed and case discussed with Care Management/Social Workerr. Management plans discussed with the patient, family and they are in agreement.  CODE STATUS: Full code  TOTAL TIME TAKING CARE OF THIS PATIENT: 38 minutes.   POSSIBLE D/C IN 1-2DAYS, DEPENDING ON CLINICAL CONDITION.   Epifanio Lesches M.D on 02/06/2019 at 12:58 PM  Between 7am to 6pm - Pager - [phone]  After 6pm go to [email or web address] - password [email or web address] [facility]  Office  [phone]  CC: Primary care physician; Albina Billet, MD   Note: This dictation was prepared with Dragon dictation along with smaller phrase technology. Any transcriptional errors that result from this process are unintentional.

## 2019-02-06 NOTE — Consult Note (Signed)
ORTHOPAEDIC CONSULTATION  REQUESTING PHYSICIAN: Epifanio Lesches, MD  Chief Complaint: Right hip pain status post fall  HPI: April [name] is a 73 y.o. female who complains of right hip pain status post fall.  Patient was unable to weight-bear after this injury.  Patient denies any dizziness or loss of consciousness.  She denies other injuries other than her right lower extremity.  Patient is currently comfortable at rest lying in bed.  Patient is eating lunch.  She was diagnosed with sepsis secondary to a UTI upon admission.  She had slight elevation in troponin due to demand ischemia per the hospitalist.  Patient is receiving vancomycin and cefepime for her sepsis.  Past Medical History:  Diagnosis Date  . Acute renal failure (ARF) ([place])   . Hypertension   . NSTEMI (non-ST elevated myocardial infarction) ([place])   . Paroxysmal atrial fibrillation (HCC)   . Renal disorder   . Sepsis [facility]) January 2017   [facility]  . Type 2 diabetes mellitus ([place])    Past Surgical History:  Procedure Laterality Date  . CYSTOSCOPY WITH STENT PLACEMENT Right 11/03/2015   Procedure: CYSTOSCOPY WITH STENT PLACEMENT;  Surgeon: Hollice Espy, MD;  Location: [facility] ORS;  Service: Urology;  Laterality: Right;  . FRACTURE SURGERY Right    Elbow  . TONSILLECTOMY    . 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 Marital status: Single    Spouse name: Not on file  . Number of children: Not on file  . Years of education: Not on file  . Highest education level: Not on file  Occupational History  . Not on file  Social Needs  . Financial resource strain: Not on file  . Food insecurity:    Worry: Not on file    Inability: Not on file  . Transportation needs:    Medical: Not on file    Non-medical: Not on file  Tobacco Use  .  Smoking status: Never Smoker  . Smokeless tobacco: Never Used  Substance and Sexual Activity  . Alcohol use: No    Alcohol/week: 0.0 standard drinks  . Drug use: No  . Sexual activity: Not on file  Lifestyle  . Physical activity:    Days per week: Not on file    Minutes per session: Not on file  . Stress: Not on file  Relationships  . Social connections:    Talks on phone: Not on file    Gets together: Not on file    Attends religious service: Not on file    Active member of club or organization: Not on file    Attends meetings of clubs or organizations: Not on file    Relationship status: Not on file  Other Topics Concern  . Not on file  Social History Narrative  . Not on file   Family History  Problem Relation Age of Onset  . Diabetes Father   . Heart disease Father   . Stroke Maternal Grandmother    Allergies  Allergen Reactions  . Prednisone Anaphylaxis   Prior to Admission medications   Medication Sig Start Date End Date Taking? Authorizing Provider  acetaminophen (TYLENOL) 500 MG tablet Take 1,000 mg by mouth every 8 (eight) hours as needed for moderate pain. Reported on 12/16/2015    [provider]  aspirin EC 81 MG EC tablet  Take 1 tablet (81 mg total) by mouth daily. 03/04/16   Algernon Huxley, MD  diclofenac sodium (VOLTAREN) 1 % GEL Apply 4 g topically 4 (four) times daily.    [provider]  docusate sodium (COLACE) 100 MG capsule Take 1 capsule (100 mg total) by mouth 2 (two) times daily. 12/10/15   Dustin Flock, MD  insulin glargine (LANTUS) 100 UNIT/ML injection Inject 0.15 mLs (15 Units total) into the skin daily. Patient not taking: Reported on 10/03/2017 11/09/15   Henreitta Leber, MD  metFORMIN (GLUCOPHAGE) 500 MG tablet 2 (two) times daily. 09/13/17   [provider]  metoprolol tartrate (LOPRESSOR) 25 MG tablet Take 1 tablet (25 mg total) by mouth 2 (two) times daily. 11/09/15   Henreitta Leber, MD  oxyCODONE (OXY IR/ROXICODONE)  5 MG immediate release tablet Take 1 tablet (5 mg total) by mouth every 6 (six) hours as needed for moderate pain or severe pain. Patient not taking: Reported on 10/03/2017 03/04/16   Algernon Huxley, MD  polyethylene glycol [facility] / Floria Raveling) packet Take 17 g by mouth daily. Patient not taking: Reported on 10/03/2017 12/10/15   Dustin Flock, MD  pravastatin (PRAVACHOL) 20 MG tablet  12/05/16   [provider]  tamsulosin (FLOMAX) 0.4 MG CAPS capsule Take 1 capsule (0.4 mg total) by mouth daily. 12/10/15   Dustin Flock, MD  traMADol Veatrice Bourbon) 50 MG tablet  12/05/16   [provider]  traZODone (DESYREL) 50 MG tablet Take 50 mg by mouth at bedtime.    [provider]   Ct Head Wo Contrast  Result Date: 02/05/2019 CLINICAL DATA:  Status post fall today. EXAM: CT HEAD WITHOUT CONTRAST TECHNIQUE: Contiguous axial images were obtained from the base of the skull through the vertex without intravenous contrast. COMPARISON:  Head CT scan 10/27/2015. FINDINGS: Brain: No evidence of acute infarction, hemorrhage, hydrocephalus, extra-axial collection or mass lesion/mass effect. Vascular: No hyperdense vessel or unexpected calcification. Skull: Intact.  No focal lesion. Sinuses/Orbits: Negative. Other: None. IMPRESSION: Negative head CT. Electronically Signed   By: Inge Rise M.D.   On: 02/05/2019 23:09   Dg Chest Portable 1 View  Result Date: 02/05/2019 CLINICAL DATA:  Pain following fall EXAM: PORTABLE CHEST 1 VIEW COMPARISON:  December 04, 2015 FINDINGS: There is no edema or consolidation. The heart size and pulmonary vascularity are normal. No adenopathy. There is aortic atherosclerosis. No evident pneumothorax. Bones are osteoporotic. There old healed rib fractures on the left. No acute fracture evident. IMPRESSION: No edema or consolidation. Stable cardiac silhouette. Bones osteoporotic with old healed rib fractures on the left. Aortic Atherosclerosis (ICD10-I70.0). Electronically  Signed   By: Lowella Grip III M.D.   On: 02/05/2019 21:58   Dg Hip Unilat W Or Wo Pelvis 2-3 Views Right  Result Date: 02/05/2019 CLINICAL DATA:  Fall with hip pain. EXAM: DG HIP (WITH OR WITHOUT PELVIS) 2-3V RIGHT COMPARISON:  Abdominal radiographs 12/09/2015 FINDINGS: There is an acute comminuted intertrochanteric right femur fracture which is mildly displaced. The femoral head remains approximated with the acetabulum. Moderate right and mild left hip osteoarthrosis is again seen. IMPRESSION: Acute intertrochanteric right femur fracture. Electronically Signed   By: Logan Bores M.D.   On: 02/05/2019 21:57    Positive ROS: All other systems have been reviewed and were otherwise negative with the exception of those mentioned in the HPI and as above.  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  The amputation wound is well-healed.  Her foot is well-perfused.  She has palpable pedal pulses.  Patient has external rotation and shortening of the right lower extremity.  The skin overlying the right hip is intact.  She has no erythema ecchymosis or significant swelling in the right hip/thigh.  Assessment: Right intertrochanteric hip fracture with subtrochanteric extension  Plan: I reviewed the x-ray films of the patient's fracture.  I am recommending intramedullary fixation for her injury.  I discussed with the patient the details of the operation as well as the postoperative course.  I discussed the risks and benefits of surgery. The risks include but are not limited to infection, bleeding requiring blood transfusion, nerve or blood vessel injury, joint stiffness or loss of motion,  persistent pain, weakness or instability, malunion, nonunion and hardware failure and the need for further surgery. Medical risks include but are not limited to DVT and pulmonary embolism, myocardial infarction, stroke, pneumonia, respiratory failure and death. Patient understood these risks and wished to proceed.   Patient is scheduled for surgery tomorrow pending medical clearance.  Thornton Park, MD    02/06/2019 12:26 PM

## 2019-02-06 NOTE — H&P (Addendum)
[name]    MR#:  [id number]  DATE OF BIRTH:  01-14-1946  DATE OF ADMISSION:  02/05/2019  PRIMARY CARE PHYSICIAN: Albina Billet, MD   REQUESTING/REFERRING PHYSICIAN: Merlyn Lot, MD  CHIEF COMPLAINT:   Chief Complaint  Patient presents with  . Fall  . Hip Pain    HISTORY OF PRESENT ILLNESS:  April [name]  is a 73 y.o. obese Caucasian female with a known history of multiple medical problems that will be mentioned below, who presented to the emergency room with acute onset of right hip pain status post accidental mechanical fall.  The patient apparently was well using her walker and lost balance before she fell.  She denied any headache or dizziness or blurred vision or paresthesias or focal muscle weakness before or after fall.  No chest pain or dyspnea or palpitations.  No nausea or vomiting.  She admitted to urinary dysuria as well as frequency and urgency with occasional bilateral flank pain.  No hematuria.  She denied any cough or wheezing or hemoptysis.  No other bleeding diathesis.  She she was order thought to be mildly confused however the patient denied any altered mental status.  Upon presentation to the emergency room, her blood pressure was elevated 154/128 with a pulse of 111, respiratory rate of 24 and pulse currently 92% on room air and later 96 to 97%.  Labs were remarkable for borderline potassium of 3.6 and a troponin I 0.03.  Her lactic acid level was 3.1 with procalcitonin of 1.93.  CBC showed remarkable leukocytosis of 35.1 with hemoconcentration of 16.4 hemoglobin and 49.7 hematocrit.  A 9% neutrophils and absolute neutrophils were 31.3 %.  Urinalysis was strongly positive for UTI and showed more than 500 glucose.  Pelvic and hip x-ray showed right hip fracture.  Dr. Mack Guise was notified about the patient.  The patient was given IV fentanyl for pain as well as IV vancomycin and cefepime in  addition to IV normal saline.  She will be admitted to a medical monitored bed for further evaluation and management. PAST MEDICAL HISTORY:   Past Medical History:  Diagnosis Date  . Acute renal failure (ARF) ([place])   . Hypertension   . NSTEMI (non-ST elevated myocardial infarction) ([place])   . Paroxysmal atrial fibrillation (HCC)   . Renal disorder   . Sepsis [facility]) January 2017   [facility]  . Type 2 diabetes mellitus ([place])     PAST SURGICAL HISTORY:   Past Surgical History:  Procedure Laterality Date  . CYSTOSCOPY WITH STENT PLACEMENT Right 11/03/2015   Procedure: CYSTOSCOPY WITH STENT PLACEMENT;  Surgeon: Hollice Espy, MD;  Location: [facility] ORS;  Service: Urology;  Laterality: Right;  . FRACTURE SURGERY Right    Elbow  . TONSILLECTOMY    . 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 Smoking status: Never Smoker  . Smokeless tobacco: Never Used  Substance Use Topics  . Alcohol use: No    Alcohol/week: 0.0 standard drinks    FAMILY HISTORY:   Family History  Problem Relation Age of Onset  . Diabetes Father   . Heart disease Father   . Stroke Maternal Grandmother     DRUG ALLERGIES:   Allergies  Allergen Reactions  .  Prednisone Anaphylaxis    REVIEW OF SYSTEMS:   ROS As per history of present illness. All pertinent systems were reviewed above. Constitutional,  HEENT, cardiovascular, respiratory, GI, GU, musculoskeletal, neuro, psychiatric, endocrine,  integumentary and hematologic systems were reviewed and are otherwise  negative/unremarkable except for positive findings mentioned above in the HPI.   MEDICATIONS AT HOME:   Prior to Admission medications   Medication Sig Start Date End Date Taking? Authorizing Provider  acetaminophen (TYLENOL) 500 MG tablet Take 1,000 mg by  mouth every 8 (eight) hours as needed for moderate pain. Reported on 12/16/2015    [provider]  aspirin EC 81 MG EC tablet Take 1 tablet (81 mg total) by mouth daily. 03/04/16   Algernon Huxley, MD  diclofenac sodium (VOLTAREN) 1 % GEL Apply 4 g topically 4 (four) times daily.    [provider]  docusate sodium (COLACE) 100 MG capsule Take 1 capsule (100 mg total) by mouth 2 (two) times daily. 12/10/15   Dustin Flock, MD  insulin glargine (LANTUS) 100 UNIT/ML injection Inject 0.15 mLs (15 Units total) into the skin daily. Patient not taking: Reported on 10/03/2017 11/09/15   Henreitta Leber, MD  metFORMIN (GLUCOPHAGE) 500 MG tablet 2 (two) times daily. 09/13/17   [provider]  metoprolol tartrate (LOPRESSOR) 25 MG tablet Take 1 tablet (25 mg total) by mouth 2 (two) times daily. 11/09/15   Henreitta Leber, MD  oxyCODONE (OXY IR/ROXICODONE) 5 MG immediate release tablet Take 1 tablet (5 mg total) by mouth every 6 (six) hours as needed for moderate pain or severe pain. Patient not taking: Reported on 10/03/2017 03/04/16   Algernon Huxley, MD  polyethylene glycol [facility] / Floria Raveling) packet Take 17 g by mouth daily. Patient not taking: Reported on 10/03/2017 12/10/15   Dustin Flock, MD  pravastatin (PRAVACHOL) 20 MG tablet  12/05/16   [provider]  tamsulosin (FLOMAX) 0.4 MG CAPS capsule Take 1 capsule (0.4 mg total) by mouth daily. 12/10/15   Dustin Flock, MD  traMADol Veatrice Bourbon) 50 MG tablet  12/05/16   [provider]  traZODone (DESYREL) 50 MG tablet Take 50 mg by mouth at bedtime.    [provider]      VITAL SIGNS:  Blood pressure (!) 155/64, pulse (!) 108, temperature 98.2 F (36.8 C), temperature source Oral, resp. rate 18, height 5\' 6"  (1.676 m), weight 117.9 kg, SpO2 97 %.  PHYSICAL EXAMINATION:  Physical Exam  GENERAL:  73 y.o.-year-old  obese Caucasian female patient lying in the bed with no acute distress.  She is alert and  cooperative. EYES: Pupils equal, round, reactive to light and accommodation. No scleral icterus. Extraocular muscles intact.  HEENT: Head atraumatic, normocephalic. Oropharynx with dry tongue and mucous membrane and nasopharynx clear.  NECK:  Supple, no jugular venous distention. No thyroid enlargement, no tenderness.  LUNGS: Normal breath sounds bilaterally, no wheezing, rales,rhonchi or crepitation. No use of accessory muscles of respiration.  CARDIOVASCULAR: Regular rate and rhythm, S1, S2 normal. No murmurs, rubs, or gallops.  ABDOMEN: Soft, nondistended with suprapubic tenderness without rebound tenderness guarding or rigidity.  No CVA tenderness.  Bowel sounds present. No organomegaly or mass.  EXTREMITIES: No pedal edema, cyanosis, or clubbing.  NEUROLOGIC: Cranial nerves II through XII are intact. Muscle strength 5/5 in all extremities. Sensation intact. Gait not checked.  Musculoskeletal: She had right hip tenderness. SKIN: No obvious rash, lesion, or ulcer.   LABORATORY PANEL:   CBC Recent  Labs  Lab 02/05/19 2122  WBC 35.1*  HGB 16.4*  HCT 49.7*  PLT 209   ------------------------------------------------------------------------------------------------------------------  Chemistries  Recent Labs  Lab 02/05/19 2122  NA 136  K 3.6  CL 96*  CO2 24  GLUCOSE 307*  BUN 29*  CREATININE 0.98  CALCIUM 9.0  AST 68*  ALT 42  ALKPHOS 94  BILITOT 1.2   ------------------------------------------------------------------------------------------------------------------  Cardiac Enzymes Recent Labs  Lab 02/05/19 2122  TROPONINI 0.03*   ------------------------------------------------------------------------------------------------------------------  RADIOLOGY:  Ct Head Wo Contrast  Result Date: 02/05/2019 CLINICAL DATA:  Status post fall today. EXAM: CT HEAD WITHOUT CONTRAST TECHNIQUE: Contiguous axial images were obtained from the base of the skull through the vertex  without intravenous contrast. COMPARISON:  Head CT scan 10/27/2015. FINDINGS: Brain: No evidence of acute infarction, hemorrhage, hydrocephalus, extra-axial collection or mass lesion/mass effect. Vascular: No hyperdense vessel or unexpected calcification. Skull: Intact.  No focal lesion. Sinuses/Orbits: Negative. Other: None. IMPRESSION: Negative head CT. Electronically Signed   By: Inge Rise M.D.   On: 02/05/2019 23:09   Dg Chest Portable 1 View  Result Date: 02/05/2019 CLINICAL DATA:  Pain following fall EXAM: PORTABLE CHEST 1 VIEW COMPARISON:  December 04, 2015 FINDINGS: There is no edema or consolidation. The heart size and pulmonary vascularity are normal. No adenopathy. There is aortic atherosclerosis. No evident pneumothorax. Bones are osteoporotic. There old healed rib fractures on the left. No acute fracture evident. IMPRESSION: No edema or consolidation. Stable cardiac silhouette. Bones osteoporotic with old healed rib fractures on the left. Aortic Atherosclerosis (ICD10-I70.0). Electronically Signed   By: Lowella Grip III M.D.   On: 02/05/2019 21:58   Dg Hip Unilat W Or Wo Pelvis 2-3 Views Right  Result Date: 02/05/2019 CLINICAL DATA:  Fall with hip pain. EXAM: DG HIP (WITH OR WITHOUT PELVIS) 2-3V RIGHT COMPARISON:  Abdominal radiographs 12/09/2015 FINDINGS: There is an acute comminuted intertrochanteric right femur fracture which is mildly displaced. The femoral head remains approximated with the acetabulum. Moderate right and mild left hip osteoarthrosis is again seen. IMPRESSION: Acute intertrochanteric right femur fracture. Electronically Signed   By: Logan Bores M.D.   On: 02/05/2019 21:57      IMPRESSION AND PLAN:   #1.  Sepsis secondary to UTI.  The patient will be admitted to medical monitored bed.  She will be continued on antibiotic therapy with IV cefepime and vancomycin.  Urine and blood cultures will be followed.  2.  Right hip fracture secondary to mechanical fall.   Pain management will be provided.  She will be kept n.p.o. after midnight.  An orthopedic consultation will be obtained in a.m. by Dr. Mack Guise who is aware about the patient.  I notified him regarding the consult.  Per the revised cardiac risk index, given history of coronary artery disease as well as diabetes mellitus on insulin with no reported history of CHF, CVA or renal failure with a creatinine more than 2, she is considered above average risk/moderate risk for perioperative cardiovascular events.  We will continue her beta-blocker therapy and she is already on statin therapy.  She has no current pulmonary issues.  3.  Hypertensive urgency.  This likely secondary to pain.  She will be continued on her antihypertensives including beta-blocker therapy.  We will place her on PRN IV labetalol.   4.  Type 2 diabetes mellitus.  We will cut down her Lantus to have her home dose while she is n.p.o. and placed on supplemental coverage with NovoLog.  Metformin will be held off.  5.  Peripheral neuropathy.  Neurontin will be resumed.  6.  Dyslipidemia.  Statin therapy will be resumed.  7.  DVT prophylaxis.  This will be provided with SCDs.  Medical prophylaxis is currently being held off preoperatively.  All the records are reviewed and case discussed with ED provider. The plan of care was discussed in details with the patient (and family). I answered all questions. The patient agreed to proceed with the above mentioned plan. Further management will depend upon hospital course.   CODE STATUS: Full code  TOTAL TIME TAKING CARE OF THIS PATIENT: 55 minutes.    Christel Mormon M.D on 02/06/2019 at 1:54 AM  Pager - [phone]  After 6pm go to [email or web address] - Proofreader  [facility]  Office  [phone]  CC: Primary care physician; Albina Billet, MD   Note: This dictation was prepared with Dragon dictation along with smaller phrase technology. Any  transcriptional errors that result from this process are unintentional.

## 2019-02-06 NOTE — Progress Notes (Signed)
Admitted this morning for fall, right hip fracture, found to have secondary to UTI. Complains of right hip pain. 1.  Sepsis secondary to UTI, continue cefepime, vancomycin, follow blood and urine cultures.,  Procalcitonin 1.9, WBC decreased from 35-33 today.  Tic acid also is decreased. 2.  Right hip fracture secondary to fall, consulted Dr. Mack Guise, patient is scheduled for surgery tomorrow. 3.  Hypertensive urgency on arrival secondary to hip pain, improved now. 4 slightly elevated troponin of 0.03 due to demand ischemia from secondary to sepsis. #5 right hip pain, continue Robaxin, morphine. 6 diabetes mellitus type 2, patient on low-dose Lantus, sliding scale insulin with coverage, n.p.o. after midnight for surgery tomorrow for right hip.  Wean off oxygen as tolerated.

## 2019-02-07 ENCOUNTER — Inpatient Hospital Stay: Payer: Medicare HMO

## 2019-02-07 ENCOUNTER — Inpatient Hospital Stay: Payer: Medicare HMO | Admitting: Anesthesiology

## 2019-02-07 ENCOUNTER — Encounter
Admission: EM | Disposition: A | Discharge status: Skilled Nursing Facility | Payer: Self-pay | Source: Home / Self Care | Attending: Internal Medicine

## 2019-02-07 ENCOUNTER — Encounter: Payer: Self-pay | Admitting: Anesthesiology

## 2019-02-07 HISTORY — PX: INTRAMEDULLARY (IM) NAIL INTERTROCHANTERIC: SHX5875

## 2019-02-07 LAB — CBC
HCT: 43.6 % (ref 36.0–46.0)
Hemoglobin: 13.9 g/dL (ref 12.0–15.0)
MCH: 30.2 pg (ref 26.0–34.0)
MCHC: 31.9 g/dL (ref 30.0–36.0)
MCV: 94.6 fL (ref 80.0–100.0)
Platelets: 194 10*3/uL (ref 150–400)
RBC: 4.61 MIL/uL (ref 3.87–5.11)
RDW: 14.1 % (ref 11.5–15.5)
WBC: 24.6 10*3/uL — ABNORMAL HIGH (ref 4.0–10.5)
nRBC: 0 % (ref 0.0–0.2)

## 2019-02-07 LAB — BASIC METABOLIC PANEL
Anion gap: 7 (ref 5–15)
BUN: 31 mg/dL — ABNORMAL HIGH (ref 8–23)
CO2: 25 mmol/L (ref 22–32)
Calcium: 8 mg/dL — ABNORMAL LOW (ref 8.9–10.3)
Chloride: 106 mmol/L (ref 98–111)
Creatinine, Ser: 0.94 mg/dL (ref 0.44–1.00)
GFR calc Af Amer: >60 mL/min (ref >60)
GFR calc non Af Amer: >60 mL/min (ref >60)
Glucose, Bld: 167 mg/dL — ABNORMAL HIGH (ref 70–99)
Potassium: 4.5 mmol/L (ref 3.5–5.1)
Sodium: 138 mmol/L (ref 135–145)

## 2019-02-07 LAB — GLUCOSE, CAPILLARY
Glucose-Capillary: 145 mg/dL — ABNORMAL HIGH (ref 70–99)
Glucose-Capillary: 173 mg/dL — ABNORMAL HIGH (ref 70–99)
Glucose-Capillary: 143 mg/dL — ABNORMAL HIGH (ref 70–99)
Glucose-Capillary: 193 mg/dL — ABNORMAL HIGH (ref 70–99)
Glucose-Capillary: 366 mg/dL — ABNORMAL HIGH (ref 70–99)
Glucose-Capillary: 377 mg/dL — ABNORMAL HIGH (ref 70–99)

## 2019-02-07 SURGERY — FIXATION, FRACTURE, INTERTROCHANTERIC, WITH INTRAMEDULLARY ROD
Anesthesia: General | Laterality: Right

## 2019-02-07 MED ORDER — ACETAMINOPHEN 10 MG/ML IV SOLN
INTRAVENOUS | Status: AC
  Filled 2019-02-07: qty 100

## 2019-02-07 MED ORDER — OXYCODONE HCL 5 MG PO TABS
5.0000 mg | ORAL_TABLET | ORAL | Status: DC | PRN
  Administered 2019-02-09 (×2): 10 mg via ORAL
  Administered 2019-02-09: 5 mg via ORAL
  Administered 2019-02-10 – 2019-02-12 (×5): 10 mg via ORAL
  Filled 2019-02-07 (×8): qty 2

## 2019-02-07 MED ORDER — ONDANSETRON HCL 4 MG/2ML IJ SOLN
INTRAMUSCULAR | Status: AC
  Filled 2019-02-07: qty 2

## 2019-02-07 MED ORDER — GABAPENTIN 300 MG PO CAPS
300.0000 mg | ORAL_CAPSULE | Freq: Every day | ORAL | Status: DC
  Administered 2019-02-07 – 2019-02-11 (×5): 300 mg via ORAL
  Filled 2019-02-07 (×5): qty 1

## 2019-02-07 MED ORDER — HYDROMORPHONE HCL 1 MG/ML IJ SOLN: 0.5000 mg | INTRAMUSCULAR | Status: DC | PRN

## 2019-02-07 MED ORDER — SUCCINYLCHOLINE CHLORIDE 20 MG/ML IJ SOLN
INTRAMUSCULAR | Status: DC | PRN
  Administered 2019-02-07: 200 mg via INTRAVENOUS

## 2019-02-07 MED ORDER — PHENYLEPHRINE HCL (PRESSORS) 10 MG/ML IV SOLN
INTRAVENOUS | Status: DC | PRN
  Administered 2019-02-07 (×6): 100 ug via INTRAVENOUS
  Administered 2019-02-07 (×2): 50 ug via INTRAVENOUS
  Administered 2019-02-07: 100 ug via INTRAVENOUS

## 2019-02-07 MED ORDER — ROCURONIUM BROMIDE 50 MG/5ML IV SOLN
INTRAVENOUS | Status: AC
  Filled 2019-02-07: qty 1

## 2019-02-07 MED ORDER — ENOXAPARIN SODIUM 40 MG/0.4ML ~~LOC~~ SOLN: 40.0000 mg | SUBCUTANEOUS | Status: DC

## 2019-02-07 MED ORDER — DOCUSATE SODIUM 100 MG PO CAPS
100.0000 mg | ORAL_CAPSULE | Freq: Two times a day (BID) | ORAL | Status: DC
  Administered 2019-02-07 – 2019-02-12 (×10): 100 mg via ORAL
  Filled 2019-02-07 (×10): qty 1

## 2019-02-07 MED ORDER — ESMOLOL HCL 100 MG/10ML IV SOLN
INTRAVENOUS | Status: AC
  Filled 2019-02-07: qty 10

## 2019-02-07 MED ORDER — BISACODYL 10 MG RE SUPP
10.0000 mg | Freq: Every day | RECTAL | Status: DC | PRN
  Filled 2019-02-07: qty 1

## 2019-02-07 MED ORDER — SUGAMMADEX SODIUM 500 MG/5ML IV SOLN
INTRAVENOUS | Status: AC
  Filled 2019-02-07: qty 5

## 2019-02-07 MED ORDER — PHENOL 1.4 % MT LIQD: 1.0000 | OROMUCOSAL | Status: DC | PRN

## 2019-02-07 MED ORDER — OXYCODONE HCL 5 MG PO TABS: 5.0000 mg | ORAL_TABLET | Freq: Once | ORAL | Status: DC | PRN

## 2019-02-07 MED ORDER — ALUM & MAG HYDROXIDE-SIMETH 200-200-20 MG/5ML PO SUSP: 30.0000 mL | ORAL | Status: DC | PRN

## 2019-02-07 MED ORDER — ROCURONIUM BROMIDE 100 MG/10ML IV SOLN
INTRAVENOUS | Status: DC | PRN
  Administered 2019-02-07: 40 mg via INTRAVENOUS

## 2019-02-07 MED ORDER — METHOCARBAMOL 500 MG PO TABS
500.0000 mg | ORAL_TABLET | Freq: Four times a day (QID) | ORAL | Status: DC | PRN
  Administered 2019-02-11: 21:00:00 500 mg via ORAL
  Filled 2019-02-07 (×2): qty 1

## 2019-02-07 MED ORDER — ENOXAPARIN SODIUM 40 MG/0.4ML ~~LOC~~ SOLN
40.0000 mg | Freq: Two times a day (BID) | SUBCUTANEOUS | Status: DC
  Administered 2019-02-08 – 2019-02-12 (×9): 40 mg via SUBCUTANEOUS
  Filled 2019-02-07 (×9): qty 0.4

## 2019-02-07 MED ORDER — FENTANYL CITRATE (PF) 100 MCG/2ML IJ SOLN
25.0000 ug | INTRAMUSCULAR | Status: DC | PRN
  Administered 2019-02-07 (×3): 25 ug via INTRAVENOUS

## 2019-02-07 MED ORDER — LIDOCAINE HCL (CARDIAC) PF 100 MG/5ML IV SOSY
PREFILLED_SYRINGE | INTRAVENOUS | Status: DC | PRN
  Administered 2019-02-07: 100 mg via INTRAVENOUS

## 2019-02-07 MED ORDER — OXYCODONE HCL 5 MG/5ML PO SOLN: 5.0000 mg | Freq: Once | ORAL | Status: DC | PRN

## 2019-02-07 MED ORDER — FENTANYL CITRATE (PF) 100 MCG/2ML IJ SOLN
INTRAMUSCULAR | Status: DC | PRN
  Administered 2019-02-07 (×2): 50 ug via INTRAVENOUS

## 2019-02-07 MED ORDER — PROPOFOL 10 MG/ML IV BOLUS
INTRAVENOUS | Status: DC | PRN
  Administered 2019-02-07: 100 mg via INTRAVENOUS

## 2019-02-07 MED ORDER — ACETAMINOPHEN 10 MG/ML IV SOLN
INTRAVENOUS | Status: DC | PRN
  Administered 2019-02-07: 1000 mg via INTRAVENOUS

## 2019-02-07 MED ORDER — ACETAMINOPHEN 500 MG PO TABS
1000.0000 mg | ORAL_TABLET | Freq: Four times a day (QID) | ORAL | Status: AC
  Administered 2019-02-08 (×2): 1000 mg via ORAL
  Filled 2019-02-07 (×4): qty 2

## 2019-02-07 MED ORDER — POTASSIUM CHLORIDE IN NACL 20-0.9 MEQ/L-% IV SOLN
INTRAVENOUS | Status: DC
  Administered 2019-02-07: 14:00:00 via INTRAVENOUS
  Filled 2019-02-07 (×3): qty 1000

## 2019-02-07 MED ORDER — SUGAMMADEX SODIUM 500 MG/5ML IV SOLN
INTRAVENOUS | Status: DC | PRN
  Administered 2019-02-07: 250 mg via INTRAVENOUS

## 2019-02-07 MED ORDER — METHOCARBAMOL 1000 MG/10ML IJ SOLN
500.0000 mg | Freq: Four times a day (QID) | INTRAVENOUS | Status: DC | PRN
  Filled 2019-02-07: qty 5

## 2019-02-07 MED ORDER — FERROUS SULFATE 325 (65 FE) MG PO TABS
325.0000 mg | ORAL_TABLET | Freq: Three times a day (TID) | ORAL | Status: DC
  Administered 2019-02-08 – 2019-02-12 (×13): 325 mg via ORAL
  Filled 2019-02-07 (×13): qty 1

## 2019-02-07 MED ORDER — ONDANSETRON HCL 4 MG/2ML IJ SOLN
INTRAMUSCULAR | Status: DC | PRN
  Administered 2019-02-07: 4 mg via INTRAVENOUS

## 2019-02-07 MED ORDER — SODIUM CHLORIDE 0.9 % IR SOLN
Status: DC | PRN
  Administered 2019-02-07: 1000 mL

## 2019-02-07 MED ORDER — ONDANSETRON HCL 4 MG PO TABS: 4.0000 mg | ORAL_TABLET | Freq: Four times a day (QID) | ORAL | Status: DC | PRN

## 2019-02-07 MED ORDER — DEXAMETHASONE SODIUM PHOSPHATE 10 MG/ML IJ SOLN
INTRAMUSCULAR | Status: DC | PRN
  Administered 2019-02-07: 10 mg via INTRAVENOUS

## 2019-02-07 MED ORDER — OXYCODONE HCL 5 MG PO TABS: 10.0000 mg | ORAL_TABLET | ORAL | Status: DC | PRN

## 2019-02-07 MED ORDER — ONDANSETRON HCL 4 MG/2ML IJ SOLN: 4.0000 mg | Freq: Four times a day (QID) | INTRAMUSCULAR | Status: DC | PRN

## 2019-02-07 MED ORDER — LIDOCAINE HCL (PF) 2 % IJ SOLN
INTRAMUSCULAR | Status: AC
  Filled 2019-02-07: qty 10

## 2019-02-07 MED ORDER — MENTHOL 3 MG MT LOZG: 1.0000 | LOZENGE | OROMUCOSAL | Status: DC | PRN

## 2019-02-07 MED ORDER — PROPOFOL 10 MG/ML IV BOLUS
INTRAVENOUS | Status: AC
  Filled 2019-02-07: qty 20

## 2019-02-07 MED ORDER — FENTANYL CITRATE (PF) 100 MCG/2ML IJ SOLN
INTRAMUSCULAR | Status: AC
  Filled 2019-02-07: qty 2

## 2019-02-07 MED ORDER — EPHEDRINE SULFATE 50 MG/ML IJ SOLN
INTRAMUSCULAR | Status: DC | PRN
  Administered 2019-02-07: 5 mg via INTRAVENOUS

## 2019-02-07 MED ORDER — ACETAMINOPHEN 325 MG PO TABS
325.0000 mg | ORAL_TABLET | Freq: Four times a day (QID) | ORAL | Status: DC | PRN
  Administered 2019-02-09 – 2019-02-11 (×3): 650 mg via ORAL
  Filled 2019-02-07 (×3): qty 2

## 2019-02-07 MED ORDER — POLYETHYLENE GLYCOL 3350 17 G PO PACK: 17.0000 g | PACK | Freq: Every day | ORAL | Status: DC | PRN

## 2019-02-07 MED ORDER — SUCCINYLCHOLINE CHLORIDE 20 MG/ML IJ SOLN
INTRAMUSCULAR | Status: AC
  Filled 2019-02-07: qty 1

## 2019-02-07 MED ORDER — SEVOFLURANE IN SOLN
RESPIRATORY_TRACT | Status: AC
  Filled 2019-02-07: qty 250

## 2019-02-07 MED ORDER — FENTANYL CITRATE (PF) 100 MCG/2ML IJ SOLN
INTRAMUSCULAR | Status: AC
  Administered 2019-02-07: 13:00:00 25 ug via INTRAVENOUS
  Filled 2019-02-07: qty 2

## 2019-02-07 SURGICAL SUPPLY — 44 items
BIT DRILL 4.3MMS DISTAL GRDTED (BIT) ×1 IMPLANT
BLADE SURG SZ10 CARB STEEL (BLADE) ×2 IMPLANT
BNDG COHESIVE 6X5 TAN STRL LF (GAUZE/BANDAGES/DRESSINGS) ×4 IMPLANT
CANISTER SUCT 1200ML W/VALVE (MISCELLANEOUS) ×2 IMPLANT
COVER WAND RF STERILE (DRAPES) ×2 IMPLANT
CRADLE LAMINECT ARM (MISCELLANEOUS) ×2 IMPLANT
DRAPE SHEET LG 3/4 BI-LAMINATE (DRAPES) ×4 IMPLANT
DRAPE SURG 17X11 SM STRL (DRAPES) ×4 IMPLANT
DRAPE U-SHAPE 47X51 STRL (DRAPES) ×2 IMPLANT
DRILL 4.3MMS DISTAL GRADUATED (BIT) ×2
DRSG OPSITE POSTOP 3X4 (GAUZE/BANDAGES/DRESSINGS) ×4 IMPLANT
DRSG OPSITE POSTOP 4X14 (GAUZE/BANDAGES/DRESSINGS) IMPLANT
DRSG OPSITE POSTOP 4X6 (GAUZE/BANDAGES/DRESSINGS) IMPLANT
DRSG OPSITE POSTOP 4X8 (GAUZE/BANDAGES/DRESSINGS) ×2 IMPLANT
DURAPREP 26ML APPLICATOR (WOUND CARE) ×4 IMPLANT
ELECT REM PT RETURN 9FT ADLT (ELECTROSURGICAL) ×2
ELECTRODE REM PT RTRN 9FT ADLT (ELECTROSURGICAL) ×1 IMPLANT
GLOVE BIOGEL PI IND STRL 9 (GLOVE) ×1 IMPLANT
GLOVE BIOGEL PI INDICATOR 9 (GLOVE) ×1
GLOVE SURG 9.0 ORTHO LTXF (GLOVE) ×12 IMPLANT
GOWN STRL REUS TWL 2XL XL LVL4 (GOWN DISPOSABLE) ×2 IMPLANT
GOWN STRL REUS W/ TWL LRG LVL3 (GOWN DISPOSABLE) ×1 IMPLANT
GOWN STRL REUS W/TWL LRG LVL3 (GOWN DISPOSABLE) ×1
GUIDEPIN VERSANAIL DSP 3.2X444 (ORTHOPEDIC DISPOSABLE SUPPLIES) ×2 IMPLANT
GUIDEWIRE BALL NOSE 80CM (WIRE) ×2 IMPLANT
HEMOVAC 400CC 10FR (MISCELLANEOUS) IMPLANT
HFN RH 130 DEG 9MM X 380MM (Nail) ×2 IMPLANT
KIT TURNOVER CYSTO (KITS) ×2 IMPLANT
MAT ABSORB  FLUID 56X50 GRAY (MISCELLANEOUS) ×1
MAT ABSORB FLUID 56X50 GRAY (MISCELLANEOUS) ×1 IMPLANT
NS IRRIG 1000ML POUR BTL (IV SOLUTION) ×2 IMPLANT
PACK HIP COMPR (MISCELLANEOUS) ×2 IMPLANT
SCREW BONE CORTICAL 5.0X36 (Screw) ×2 IMPLANT
SCREW BONE CORTICAL 5.0X40 (Screw) ×2 IMPLANT
SCREW LAG HIP NAIL 10.5X95 (Screw) ×2 IMPLANT
STAPLER SKIN PROX 35W (STAPLE) ×2 IMPLANT
SUCTION FRAZIER HANDLE 10FR (MISCELLANEOUS) ×1
SUCTION TUBE FRAZIER 10FR DISP (MISCELLANEOUS) ×1 IMPLANT
SUT VIC AB 0 CT1 36 (SUTURE) ×2 IMPLANT
SUT VIC AB 2-0 CT1 27 (SUTURE) ×1
SUT VIC AB 2-0 CT1 TAPERPNT 27 (SUTURE) ×1 IMPLANT
SUT VICRYL 0 AB UR-6 (SUTURE) IMPLANT
SYR 30ML LL (SYRINGE) ×2 IMPLANT
TUBING CONNECTING 10 (TUBING) ×2 IMPLANT

## 2019-02-07 NOTE — Progress Notes (Signed)
Patient moaning stating "I can't breathe" SPO2 is 95% room air. [place] in place on 2 L for support.

## 2019-02-07 NOTE — Progress Notes (Signed)
Anticoagulation monitoring(Lovenox):  73yo  female ordered Lovenox 40 mg Q24h  Filed Weights   02/05/19 2115  Weight: 260 lb (117.9 kg)   BMI 41.97   Lab Results  Component Value Date   CREATININE 0.94 02/07/2019   CREATININE 0.96 02/06/2019   CREATININE 0.98 02/05/2019   Estimated Creatinine Clearance: 70.6 mL/min (by C-G formula based on SCr of 0.94 mg/dL). Hemoglobin & Hematocrit     Component Value Date/Time   HGB 13.9 02/07/2019 0456   HCT 43.6 02/07/2019 0456     Per Protocol for Patient with estCrcl > 30 ml/min and BMI > 40, will transition to Lovenox 40 mg Q12h.

## 2019-02-07 NOTE — NC FL2 (Signed)
[place] LEVEL OF CARE SCREENING TOOL     IDENTIFICATION  Patient Name: April [name] Birthdate: 1946/06/08 Sex: female Admission Date (Current Location): 02/05/2019  [place] and [place] Number:  Engineering geologist and Address:  [facility], [address] [place], [place] [address]      Provider Number: [id number]  Attending Physician Name and Address:  Epifanio Lesches, MD  Relative Name and Phone Number:  Fayne Mediate [phone]    Current Level of Care: Hospital Recommended Level of Care: [place] Prior Approval Number:    Date Approved/Denied: 05/03/16 PASRR Number: [id number]  Discharge Plan: SNF    Current Diagnoses: Patient Active Problem List   Diagnosis Date Noted  . S/P right hip fracture 02/05/2019  . PAD (peripheral artery disease) ([place]) 10/03/2017  . Essential hypertension 10/03/2017  . Pain in limb 03/21/2017  . Swelling of limb 10/18/2016  . Ulcer of calf ([place]) 10/18/2016  . Gangrene of foot ([place]) 03/03/2016  . UPJ (ureteropelvic junction) obstruction   . Paroxysmal atrial fibrillation ([place]) 12/04/2015  . Back pain 12/04/2015  . Thrombocytopenia ([place]) 12/03/2015  . Leukocytosis 12/03/2015  . Type 2 diabetes mellitus ([place]) 12/03/2015  . Respiratory failure ([place])   . Proteus septicemia ([place])   . Acute renal failure (ARF) ([place])   . Hydronephrosis   . Endotracheally intubated   . Respiratory distress   . Arterial hypotension   . Sepsis ([place]) 10/20/2015  . Acute renal failure ([place])   . Altered mental status   . NSTEMI (non-ST elevated myocardial infarction) ([place])   . Sepsis due to urinary tract infection (HCC)     Orientation RESPIRATION BLADDER Height & Weight     Self, Time, Situation, Place  Normal, O2 Continent Weight: 117.9 kg Height:  5\' 6"  (167.6 cm)  BEHAVIORAL SYMPTOMS/MOOD NEUROLOGICAL BOWEL NUTRITION STATUS      Continent Diet  AMBULATORY STATUS  COMMUNICATION OF NEEDS Skin   Extensive Assist Verbally Normal, Surgical wounds                       Personal Care Assistance Level of Assistance  Dressing, Bathing Bathing Assistance: Limited assistance   Dressing Assistance: Limited assistance     Functional Limitations Info  Sight, Hearing, Speech Sight Info: Adequate Hearing Info: Adequate Speech Info: Adequate    SPECIAL CARE FACTORS FREQUENCY  PT (By licensed PT)     PT Frequency: 5 X per week              Contractures Contractures Info: Not present    Additional Factors Info  Code Status, Allergies Code Status Info: full Allergies Info: prednisone           Current Medications (02/07/2019):  This is the current hospital active medication list Current Facility-Administered Medications  Medication Dose Route Frequency Provider Last Rate Last Dose  . 0.9 % NaCl with KCl 20 mEq/ L  infusion   Intravenous Continuous Thornton Park, MD 75 mL/hr at 02/07/19 1419    . acetaminophen (TYLENOL) tablet 1,000 mg  1,000 mg Oral Q6H Thornton Park, MD      . Derrill Memo ON 02/08/2019] acetaminophen (TYLENOL) tablet 325-650 mg  325-650 mg Oral Q6H PRN Thornton Park, MD      . alum & mag hydroxide-simeth (MAALOX/MYLANTA) 200-200-20 MG/5ML suspension 30 mL  30 mL Oral Q4H PRN Thornton Park, MD      . bisacodyl (DULCOLAX) suppository 10 mg  10  mg Rectal Daily PRN Thornton Park, MD      . ceFEPIme (MAXIPIME) 2 g in sodium chloride 0.9 % 100 mL IVPB  2 g Intravenous Q8H Thornton Park, MD 200 mL/hr at 02/07/19 0507 2 g at 02/07/19 0507  . Chlorhexidine Gluconate Cloth 2 % PADS 6 each  6 each Topical Q0600 Thornton Park, MD   6 each at 02/07/19 0511  . diclofenac sodium (VOLTAREN) 1 % transdermal gel 4 g  4 g Topical QID Thornton Park, MD   4 g at 02/06/19 2112  . docusate sodium (COLACE) capsule 100 mg  100 mg Oral BID Thornton Park, MD      . Derrill Memo ON 02/08/2019] enoxaparin (LOVENOX) injection 40 mg  40 mg  Subcutaneous Q12H Thornton Park, MD      . fentaNYL (SUBLIMAZE) injection 50 mcg  50 mcg Intravenous Q1H PRN Thornton Park, MD   50 mcg at 02/05/19 2216  . ferrous sulfate tablet 325 mg  325 mg Oral TID PC Thornton Park, MD      . gabapentin (NEURONTIN) capsule 300 mg  300 mg Oral QHS Thornton Park, MD      . HYDROmorphone (DILAUDID) injection 0.5-1 mg  0.5-1 mg Intravenous Q4H PRN Thornton Park, MD      . insulin aspart (novoLOG) injection 0-9 Units  0-9 Units Subcutaneous Q4H Thornton Park, MD   2 Units at 02/07/19 [phone]  . insulin glargine (LANTUS) injection 8 Units  8 Units Subcutaneous Daily Thornton Park, MD   8 Units at 02/06/19 [phone]  . ketorolac (TORADOL) 30 MG/ML injection 15 mg  15 mg Intravenous Q6H PRN Thornton Park, MD   15 mg at 02/06/19 0112  . labetalol (NORMODYNE) injection 20 mg  20 mg Intravenous Q3H PRN Thornton Park, MD      . magnesium hydroxide (MILK OF MAGNESIA) suspension 30 mL  30 mL Oral Daily PRN Thornton Park, MD      . menthol-cetylpyridinium (CEPACOL) lozenge 3 mg  1 lozenge Oral PRN Thornton Park, MD       Or  . phenol (CHLORASEPTIC) mouth spray 1 spray  1 spray Mouth/Throat PRN Thornton Park, MD      . methocarbamol (ROBAXIN) tablet 500 mg  500 mg Oral Q6H PRN Thornton Park, MD       Or  . methocarbamol (ROBAXIN) 500 mg in dextrose 5 % 50 mL IVPB  500 mg Intravenous Q6H PRN Thornton Park, MD      . metoprolol tartrate (LOPRESSOR) tablet 25 mg  25 mg Oral BID Thornton Park, MD   25 mg at 02/07/19 2703  . mupirocin ointment (BACTROBAN) 2 % 1 application  1 application Nasal BID Thornton Park, MD   1 application at 50/09/38 2113  . ondansetron (ZOFRAN) tablet 4 mg  4 mg Oral Q6H PRN Thornton Park, MD       Or  . ondansetron [facility]) injection 4 mg  4 mg Intravenous Q6H PRN Thornton Park, MD      . oxyCODONE (Oxy IR/ROXICODONE) immediate release tablet 10-15 mg  10-15 mg Oral Q4H PRN Thornton Park, MD      .  oxyCODONE (Oxy IR/ROXICODONE) immediate release tablet 5-10 mg  5-10 mg Oral Q4H PRN Thornton Park, MD      . polyethylene glycol (MIRALAX / GLYCOLAX) packet 17 g  17 g Oral Daily PRN Thornton Park, MD      . pravastatin (PRAVACHOL) tablet 20 mg  20 mg Oral Daily Thornton Park, MD  20 mg at 02/06/19 0917  . tamsulosin (FLOMAX) capsule 0.4 mg  0.4 mg Oral Daily Thornton Park, MD      . traZODone (DESYREL) tablet 50 mg  50 mg Oral QHS Thornton Park, MD   50 mg at 02/06/19 2103  . vancomycin (VANCOCIN) 1,250 mg in sodium chloride 0.9 % 250 mL IVPB  1,250 mg Intravenous Q24H Thornton Park, MD 166.7 mL/hr at 02/06/19 2347 1,250 mg at 02/06/19 2347     Discharge Medications: Please see discharge summary for a list of discharge medications.  Relevant Imaging Results:  Relevant Lab Results:   Additional Information [id number]  Su Hilt, RN

## 2019-02-07 NOTE — Anesthesia Postprocedure Evaluation (Signed)
Anesthesia Post Note  Patient: April [name]  Procedure(s) Performed: INTRAMEDULLARY (IM) NAIL INTERTROCHANTRIC (Right )  Patient location during evaluation: PACU Anesthesia Type: General Level of consciousness: awake and alert Pain management: pain level controlled Vital Signs Assessment: post-procedure vital signs reviewed and stable Respiratory status: spontaneous breathing, nonlabored ventilation, respiratory function stable and patient connected to nasal cannula oxygen Cardiovascular status: blood pressure returned to baseline and stable Postop Assessment: no apparent nausea or vomiting Anesthetic complications: no     Last Vitals:  Vitals:   02/07/19 1148 02/07/19 1203  BP: (!) 142/68 122/71  Pulse: (!) 107 (!) 102  Resp: (!) 25 (!) 23  Temp: (!) 36.3 C   SpO2: 92% 95%    Last Pain:  Vitals:   02/07/19 1218  TempSrc:   PainSc: 0-No pain                 Precious Haws Palmira Stickle

## 2019-02-07 NOTE — TOC Initial Note (Signed)
Transition of Care [facility]) - Initial/Assessment Note    Patient Details  Name: April [name] MRN: [id number] Date of Birth: [birth year]  Transition of [facility]) CM/SW Contact:    Su Hilt, RN Phone Number: 02/07/2019, 2:35 PM  Clinical Narrative:                 Met with the patient to discuss DC needs and plan, she lives alone and has no assistance at home, she walks with a Roillator at baseline, she fell at home and sustained a Fractured hip requiring surgery, she agrees to go to Short term rehab. She normally drives when able and has noone to provide back up transportation She gives permission to do a bed search for Short term rehab Continue to monitor for needs  Expected Discharge Plan: [place] Barriers to Discharge: Continued Medical Work up   Patient Goals and CMS Choice Patient states their goals for this hospitalization and ongoing recovery are:: go to short term rehab CMS Medicare.gov Compare Post Acute Care list provided to:: Patient Choice offered to / list presented to : Patient  Expected Discharge Plan and Services Expected Discharge Plan: [place]   Discharge Planning Services: CM Consult Post Acute Care Choice: [place] Living arrangements for the past 2 months: Single Family Home                                      Prior Living Arrangements/Services Living arrangements for the past 2 months: Single Family Home Lives with:: Self Patient language and need for interpreter reviewed:: No        Need for Family Participation in Patient Care: No (Comment) Care giver support system in place?: Yes (comment) Current home services: DME(rollator, ) Criminal Activity/Legal Involvement Pertinent to Current Situation/Hospitalization: No - Comment as needed  Activities of Daily Living Home Assistive Devices/Equipment: Gilford Rile (specify type) ADL Screening (condition at time of admission) Patient's  cognitive ability adequate to safely complete daily activities?: Yes Is the patient deaf or have difficulty hearing?: No Does the patient have difficulty seeing, even when wearing glasses/contacts?: No Does the patient have difficulty concentrating, remembering, or making decisions?: No Patient able to express need for assistance with ADLs?: Yes Does the patient have difficulty dressing or bathing?: No Independently performs ADLs?: Yes (appropriate for developmental age) Does the patient have difficulty walking or climbing stairs?: Yes Weakness of Legs: Both Weakness of Arms/Hands: None  Permission Sought/Granted Permission sought to share information with : Facility Art therapist granted to share information with : Yes, Verbal Permission Granted        Permission granted to share info w Relationship: any SNF     Emotional Assessment Appearance:: Appears stated age Attitude/Demeanor/Rapport: Engaged Affect (typically observed): Accepting Orientation: : Oriented to Self, Oriented to Place, Oriented to  Time, Oriented to Situation Alcohol / Substance Use: Never Used Psych Involvement: No (comment)  Admission diagnosis:  Closed displaced intertrochanteric fracture of right femur, initial encounter ([place]) [S72.141A] Sepsis with encephalopathy without septic shock, due to unspecified organism ([place]) [A41.9, R65.20, G93.40] Patient Active Problem List   Diagnosis Date Noted  . S/P right hip fracture 02/05/2019  . PAD (peripheral artery disease) ([place]) 10/03/2017  . Essential hypertension 10/03/2017  . Pain in limb 03/21/2017  . Swelling of limb 10/18/2016  . Ulcer of calf ([place]) 10/18/2016  . Gangrene  of foot ([place]) 03/03/2016  . UPJ (ureteropelvic junction) obstruction   . Paroxysmal atrial fibrillation ([place]) 12/04/2015  . Back pain 12/04/2015  . Thrombocytopenia ([place]) 12/03/2015  . Leukocytosis 12/03/2015  . Type 2 diabetes mellitus ([place]) 12/03/2015  .  Respiratory failure ([place])   . Proteus septicemia ([place])   . Acute renal failure (ARF) ([place])   . Hydronephrosis   . Endotracheally intubated   . Respiratory distress   . Arterial hypotension   . Sepsis ([place]) 10/20/2015  . Acute renal failure ([place])   . Altered mental status   . NSTEMI (non-ST elevated myocardial infarction) ([place])   . Sepsis due to urinary tract infection (HCC)    PCP:  Albina Billet, MD Pharmacy:   [id number] [id number]: [phone]: 3[place] [address]Phone: [phone] Fax: [phone]    Social Determinants of Health (SDOH) Interventions    Readmission Risk Interventions No flowsheet data found.

## 2019-02-07 NOTE — Anesthesia Procedure Notes (Signed)
Procedure Name: Intubation Date/Time: 02/07/2019 9:18 AM Performed by: Doreen Salvage, CRNA Pre-anesthesia Checklist: Patient identified, Emergency Drugs available, Suction available and Patient being monitored Patient Re-evaluated:Patient Re-evaluated prior to induction Oxygen Delivery Method: Circle system utilized Preoxygenation: Pre-oxygenation with 100% oxygen Induction Type: IV induction, Cricoid Pressure applied and Rapid sequence Ventilation: Mask ventilation without difficulty Laryngoscope Size: Mac, 3, McGraph, Miller and 2 Tube type: Oral Tube size: 7.0 mm Number of attempts: 2 Airway Equipment and Method: Stylet Placement Confirmation: ETT inserted through vocal cords under direct vision,  positive ETCO2 and breath sounds checked- equal and bilateral Secured at: 21 cm Tube secured with: Tape Dental Injury: Teeth and Oropharynx as per pre-operative assessment  Difficulty Due To: Difficult Airway- due to anterior larynx

## 2019-02-07 NOTE — Progress Notes (Signed)
Subjective:  Patient seen in the preop area this morning.   Patient reports right hip pain as marked.    Objective:   VITALS:   Vitals:   02/06/19 2050 02/07/19 0437 02/07/19 0810 02/07/19 0830  BP:  (!) 152/79 (!) 163/82 (!) 176/73  Pulse:  88 (!) 110 (!) 109  Resp:  16  17  Temp: 99 F (37.2 C) 98.9 F (37.2 C)  (!) 100.4 F (38 C)  TempSrc: Oral Oral  Tympanic  SpO2:  94% 96% 95%  Weight:      Height:        PHYSICAL EXAM: Right lower extremity Neurovascular intact Sensation intact distally Intact pulses distally Dorsiflexion/Plantar flexion intact No cellulitis present Compartment soft  LABS  Results for orders placed or performed during the hospital encounter of 02/05/19 (from the past 24 hour(s))  Glucose, capillary     Status: Abnormal   Collection Time: 02/06/19  9:16 AM  Result Value Ref Range   Glucose-Capillary 233 (H) 70 - 99 mg/dL  Glucose, capillary     Status: Abnormal   Collection Time: 02/06/19 11:42 AM  Result Value Ref Range   Glucose-Capillary 271 (H) 70 - 99 mg/dL  Glucose, capillary     Status: Abnormal   Collection Time: 02/06/19  4:49 PM  Result Value Ref Range   Glucose-Capillary 250 (H) 70 - 99 mg/dL  Glucose, capillary     Status: Abnormal   Collection Time: 02/06/19  8:36 PM  Result Value Ref Range   Glucose-Capillary 250 (H) 70 - 99 mg/dL  Glucose, capillary     Status: Abnormal   Collection Time: 02/06/19 11:51 PM  Result Value Ref Range   Glucose-Capillary 170 (H) 70 - 99 mg/dL  Glucose, capillary     Status: Abnormal   Collection Time: 02/07/19  4:35 AM  Result Value Ref Range   Glucose-Capillary 145 (H) 70 - 99 mg/dL   Comment 1 Notify RN   Basic metabolic panel     Status: Abnormal   Collection Time: 02/07/19  4:56 AM  Result Value Ref Range   Sodium 138 135 - 145 mmol/L   Potassium 4.5 3.5 - 5.1 mmol/L   Chloride 106 98 - 111 mmol/L   CO2 25 22 - 32 mmol/L   Glucose, Bld 167 (H) 70 - 99 mg/dL   BUN 31 (H) 8 - 23  mg/dL   Creatinine, Ser 0.94 0.44 - 1.00 mg/dL   Calcium 8.0 (L) 8.9 - 10.3 mg/dL   GFR calc non Af Amer >60 >60 mL/min   GFR calc Af Amer >60 >60 mL/min   Anion gap 7 5 - 15  CBC     Status: Abnormal   Collection Time: 02/07/19  4:56 AM  Result Value Ref Range   WBC 24.6 (H) 4.0 - 10.5 K/uL   RBC 4.61 3.87 - 5.11 MIL/uL   Hemoglobin 13.9 12.0 - 15.0 g/dL   HCT 43.6 36.0 - 46.0 %   MCV 94.6 80.0 - 100.0 fL   MCH 30.2 26.0 - 34.0 pg   MCHC 31.9 30.0 - 36.0 g/dL   RDW 14.1 11.5 - 15.5 %   Platelets 194 150 - 400 K/uL   nRBC 0.0 0.0 - 0.2 %  Glucose, capillary     Status: Abnormal   Collection Time: 02/07/19  7:39 AM  Result Value Ref Range   Glucose-Capillary 173 (H) 70 - 99 mg/dL  Glucose, capillary     Status: Abnormal  Collection Time: 02/07/19  8:32 AM  Result Value Ref Range   Glucose-Capillary 143 (H) 70 - 99 mg/dL    Ct Head Wo Contrast  Result Date: 02/05/2019 CLINICAL DATA:  Status post fall today. EXAM: CT HEAD WITHOUT CONTRAST TECHNIQUE: Contiguous axial images were obtained from the base of the skull through the vertex without intravenous contrast. COMPARISON:  Head CT scan 10/27/2015. FINDINGS: Brain: No evidence of acute infarction, hemorrhage, hydrocephalus, extra-axial collection or mass lesion/mass effect. Vascular: No hyperdense vessel or unexpected calcification. Skull: Intact.  No focal lesion. Sinuses/Orbits: Negative. Other: None. IMPRESSION: Negative head CT. Electronically Signed   By: Inge Rise M.D.   On: 02/05/2019 23:09   Dg Chest Portable 1 View  Result Date: 02/05/2019 CLINICAL DATA:  Pain following fall EXAM: PORTABLE CHEST 1 VIEW COMPARISON:  December 04, 2015 FINDINGS: There is no edema or consolidation. The heart size and pulmonary vascularity are normal. No adenopathy. There is aortic atherosclerosis. No evident pneumothorax. Bones are osteoporotic. There old healed rib fractures on the left. No acute fracture evident. IMPRESSION: No edema or  consolidation. Stable cardiac silhouette. Bones osteoporotic with old healed rib fractures on the left. Aortic Atherosclerosis (ICD10-I70.0). Electronically Signed   By: Lowella Grip III M.D.   On: 02/05/2019 21:58   Dg Hip Unilat W Or Wo Pelvis 2-3 Views Right  Result Date: 02/05/2019 CLINICAL DATA:  Fall with hip pain. EXAM: DG HIP (WITH OR WITHOUT PELVIS) 2-3V RIGHT COMPARISON:  Abdominal radiographs 12/09/2015 FINDINGS: There is an acute comminuted intertrochanteric right femur fracture which is mildly displaced. The femoral head remains approximated with the acetabulum. Moderate right and mild left hip osteoarthrosis is again seen. IMPRESSION: Acute intertrochanteric right femur fracture. Electronically Signed   By: Logan Bores M.D.   On: 02/05/2019 21:57    Assessment/Plan: Day of Surgery   Active Problems:   S/P right hip fracture  Patient has been cleared for surgery by the medical service.  Plan for intramedullary fixation of the right intertrochanteric hip fracture with subtrochanteric extension.  I have reviewed the patient's x-rays and labs operation for this case.    Thornton Park , MD 02/07/2019, 8:56 AM

## 2019-02-07 NOTE — Anesthesia Post-op Follow-up Note (Signed)
Anesthesia QCDR form completed.        

## 2019-02-07 NOTE — Progress Notes (Addendum)
Patient went to the OR for right hip fracture surgery.  With ORIF  WBC improved today.  Cultures are showing Pseudomonas.  Continue cefepime UTI. 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 Time spent; 10  minutes

## 2019-02-07 NOTE — Transfer of Care (Signed)
Immediate Anesthesia Transfer of Care Note  Patient: April [name]  Procedure(s) Performed: Procedure(s): INTRAMEDULLARY (IM) NAIL INTERTROCHANTRIC (Right)  Patient Location: PACU  Anesthesia Type:General  Level of Consciousness: sedated  Airway & Oxygen Therapy: Patient Spontanous Breathing and Patient connected to face mask oxygen  Post-op Assessment: Report given to RN and Post -op Vital signs reviewed and stable  Post vital signs: Reviewed and stable  Last Vitals:  Vitals:   02/07/19 0830 02/07/19 1148  BP: (!) 176/73 (!) 142/68  Pulse: (!) 109 (!) 107  Resp: 17 (!) 25  Temp: (!) 38 C (!) 36.3 C  SpO2: 53% 64%    Complications: No apparent anesthesia complications

## 2019-02-07 NOTE — Anesthesia Preprocedure Evaluation (Addendum)
Anesthesia Evaluation  Patient identified by MRN, date of birth, ID band Patient awake    Reviewed: Allergy & Precautions, H&P , NPO status , Patient's Chart, lab work & pertinent test results, reviewed documented beta blocker date and time   History of Anesthesia Complications Negative for: history of anesthetic complications  Airway Mallampati: III  TM Distance: <3 FB Neck ROM: limited    Dental  (+) Poor Dentition, Chipped   Pulmonary neg pulmonary ROS, neg shortness of breath,    Pulmonary exam normal        Cardiovascular Exercise Tolerance: Good hypertension, On Medications (-) angina+ Past MI  (-) Peripheral Vascular Disease Normal cardiovascular exam Rhythm:regular Rate:Normal     Neuro/Psych negative neurological ROS  negative psych ROS   GI/Hepatic negative GI ROS, Neg liver ROS,   Endo/Other  diabetes, Type 2  Renal/GU Renal disease  negative genitourinary   Musculoskeletal   Abdominal   Peds  Hematology negative hematology ROS (+)   Anesthesia Other Findings Past Medical History: No date: Acute renal failure (ARF) (HCC) No date: Hypertension No date: NSTEMI (non-ST elevated myocardial infarction) ([place]) No date: Paroxysmal atrial fibrillation ([place]) No date: Renal disorder January 2017: Sepsis ([place])     Comment:  [facility] No date: Type 2 diabetes mellitus ([place]) Past Surgical History: 11/03/2015: CYSTOSCOPY WITH STENT PLACEMENT; Right     Comment:  Procedure: CYSTOSCOPY WITH STENT PLACEMENT;  Surgeon:               Hollice Espy, MD;  Location: [facility] ORS;  Service:               Urology;  Laterality: Right; No date: FRACTURE SURGERY; Right     Comment:  [place] No date: TONSILLECTOMY 03/03/2016: TRANSMETATARSAL AMPUTATION; Bilateral     Comment:  Procedure: TRANSMETATARSAL AMPUTATION;  Surgeon: Algernon Huxley, MD;  Location: [facility] ORS;  Service: Vascular;                Laterality:  Bilateral; BMI    Body Mass Index:  41.97 kg/m     Reproductive/Obstetrics negative OB ROS                            Anesthesia Physical Anesthesia Plan  ASA: IV and emergent  Anesthesia Plan: General ETT   Post-op Pain Management:    Induction: Intravenous  PONV Risk Score and Plan: Ondansetron, Dexamethasone, Midazolam and Treatment may vary due to age or medical condition  Airway Management Planned: Oral ETT  Additional Equipment:   Intra-op Plan:   Post-operative Plan: Extubation in OR  Informed Consent: I have reviewed the patients History and Physical, chart, labs and discussed the procedure including the risks, benefits and alternatives for the proposed anesthesia with the patient or authorized representative who has indicated his/her understanding and acceptance.     Dental Advisory Given  Plan Discussed with: CRNA  Anesthesia Plan Comments: (Patient with elevated WBC 2/2 sepsis so plan for GA over spinal  Patient consented for risks of anesthesia including but not limited to:  - adverse reactions to medications - damage to teeth, lips or other oral mucosa - sore throat or hoarseness - Damage to heart, brain, lungs or loss of life  Patient voiced understanding.)      Anesthesia Quick Evaluation

## 2019-02-07 NOTE — Op Note (Signed)
02/07/2019  12:00 PM  PATIENT:  April [name]    PRE-OPERATIVE DIAGNOSIS:  Right   POST-OPERATIVE DIAGNOSIS:  Same  PROCEDURE:  RIGHT INTRAMEDULLARY FIXATION FOR INTERTROCHANTERIC HIP FRACTURE WITH SUBTROCHANTERIC EXTENSION  SURGEON:  Thornton Park, MD  ANESTHESIA:   General  PREOPERATIVE INDICATIONS:  April [name] is a  73 y.o. female with a diagnosis of a comminuted, displaced right intertrochanteric hip fracture with subtrochanteric extension.   Given the displaced nature of the patient's fracture interventional fixation was recommended.  The risks, benefits and alternatives were discussed with the patient and their family.  The risks include but are not limited to infection, bleeding, nerve or blood vessel injury, malunion, nonunion, hardware prominence, hardware failure, change in leg lengths or lower extremity rotation need for further surgery including hardware removal with conversion to a total hip arthroplasty. Medical risks include but are not limited to DVT and pulmonary embolism, myocardial infarction, stroke, pneumonia, respiratory failure and death. The patient and their family understood these risks and wished to proceed with surgery.  OPERATIVE PROCEDURE:  The patient was brought to the operating room and placed in the supine position on the fracture table. General anesthesia was administered.  A closed reduction was performed under C-arm guidance.  The fracture reduction was confirmed on both AP and lateral views. A time out was performed to verify the patient's name, date of birth, medical record number, correct site of surgery correct procedure to be performed. The timeout was also used to verify the patient received antibiotics and all appropriate instruments, implants and radiographic studies were available in the room. Once all in attendance were in agreement, the case began. The patient was prepped and draped in a sterile fashion.  She received preoperative antibiotics  with grams of Ancef prior to incision.  She had been on vancomycin and cefepime for UTI sepsis prior to surgery.  An incision was made proximal to the greater trochanter in line with the femur. A guidewire was placed over the tip of the greater trochanter and advanced into the proximal femur to the level of the lesser trochanter.  Confirmation of the drill pin position was made on AP and lateral C-arm images.  The threaded guidepin was then overdrilled with the proximal femoral drill.  A ball-tipped guidewire was then advanced across the fracture and down the femoral shaft to the knee. Its position at both the hip and the knee was confirmed on C-arm imaging. A depth gauge was used to measure the length of the long nail to be used. It was measured to be 380 mm. The nail was then inserted into the proximal femur, across the fracture site and down the femoral shaft. Its position was confirmed on AP and lateral C-arm images.   Once the nail was completely seated, the drill guide for the lag screw was placed through the guide arm for the Affixus nail. A guidepin was then placed through this drill guide and advanced through the lateral cortex of the femur, across the fracture site and into the femoral head achieving a tip apex distance of less than 25 mm. The length of the drill pin was measured to 95 mm, and then the drill for the lag screw was advanced through the lateral cortex, across the fracture site and up into the femoral head to the depth of 95 mm.  The lag screw was then advanced by hand into position across the fracture site into the femoral head. Its final position was confirmed on  AP and lateral C-arm images. Compression was applied as traction was carefully released. The set screw in the top of the intramedullary rod was tightened by hand using a screwdriver. It was backed off a quarter turn to allow for compression at the fracture site.  The attention was then turned to placement of the distal  interlocking screws. A perfect circle technique was used. 2 small stab incisions were made over the distal interlocking screw holes. A free hand technique was used to drill both distal interlocking screws. The depth of the screws was measured with a depth gauge. The appropriate length screw was then advanced into position and tightened by hand. Final C-arm images of the entire intramedullary construct were taken in both the AP and lateral planes.   The wounds were irrigated copiously and closed with 0 Vicryl for closure of the deep fascia and 2-0 Vicryl for subcutaneous closure. The skin was approximated with staples. A dry sterile dressing was applied. I was scrubbed and present the entire case and all sharp and instrument counts were correct at the conclusion of the case. Patient was transferred to hospital bed and brought to PACU in stable condition.    Timoteo Gaul, MD

## 2019-02-08 ENCOUNTER — Inpatient Hospital Stay: Payer: Medicare HMO

## 2019-02-08 LAB — GLUCOSE, CAPILLARY
Glucose-Capillary: 208 mg/dL — ABNORMAL HIGH (ref 70–99)
Glucose-Capillary: 212 mg/dL — ABNORMAL HIGH (ref 70–99)
Glucose-Capillary: 216 mg/dL — ABNORMAL HIGH (ref 70–99)
Glucose-Capillary: 246 mg/dL — ABNORMAL HIGH (ref 70–99)
Glucose-Capillary: 262 mg/dL — ABNORMAL HIGH (ref 70–99)
Glucose-Capillary: 277 mg/dL — ABNORMAL HIGH (ref 70–99)

## 2019-02-08 LAB — BASIC METABOLIC PANEL
Anion gap: 6 (ref 5–15)
BUN: 32 mg/dL — ABNORMAL HIGH (ref 8–23)
CO2: 24 mmol/L (ref 22–32)
Calcium: 7.7 mg/dL — ABNORMAL LOW (ref 8.9–10.3)
Chloride: 108 mmol/L (ref 98–111)
Creatinine, Ser: 0.96 mg/dL (ref 0.44–1.00)
GFR calc Af Amer: >60 mL/min (ref >60)
GFR calc non Af Amer: 59 mL/min — ABNORMAL LOW (ref >60)
Glucose, Bld: 248 mg/dL — ABNORMAL HIGH (ref 70–99)
Potassium: 4.4 mmol/L (ref 3.5–5.1)
Sodium: 138 mmol/L (ref 135–145)

## 2019-02-08 LAB — CBC
HCT: 38.1 % (ref 36.0–46.0)
Hemoglobin: 11.9 g/dL — ABNORMAL LOW (ref 12.0–15.0)
MCH: 30.1 pg (ref 26.0–34.0)
MCHC: 31.2 g/dL (ref 30.0–36.0)
MCV: 96.5 fL (ref 80.0–100.0)
Platelets: 177 10*3/uL (ref 150–400)
RBC: 3.95 MIL/uL (ref 3.87–5.11)
RDW: 14.5 % (ref 11.5–15.5)
WBC: 26.3 10*3/uL — ABNORMAL HIGH (ref 4.0–10.5)
nRBC: 0 % (ref 0.0–0.2)

## 2019-02-08 MED ORDER — GUAIFENESIN ER 600 MG PO TB12
600.0000 mg | ORAL_TABLET | Freq: Two times a day (BID) | ORAL | Status: DC
  Administered 2019-02-08 – 2019-02-12 (×9): 600 mg via ORAL
  Filled 2019-02-08 (×9): qty 1

## 2019-02-08 MED ORDER — IPRATROPIUM-ALBUTEROL 0.5-2.5 (3) MG/3ML IN SOLN
3.0000 mL | RESPIRATORY_TRACT | Status: DC
  Administered 2019-02-08 (×3): 3 mL via RESPIRATORY_TRACT
  Filled 2019-02-08 (×2): qty 3

## 2019-02-08 MED ORDER — IPRATROPIUM-ALBUTEROL 0.5-2.5 (3) MG/3ML IN SOLN
3.0000 mL | Freq: Three times a day (TID) | RESPIRATORY_TRACT | Status: DC
  Administered 2019-02-09: 08:00:00 3 mL via RESPIRATORY_TRACT
  Filled 2019-02-08: qty 3

## 2019-02-08 MED ORDER — CIPROFLOXACIN HCL 500 MG PO TABS
500.0000 mg | ORAL_TABLET | Freq: Two times a day (BID) | ORAL | Status: DC
  Administered 2019-02-08 – 2019-02-12 (×9): 500 mg via ORAL
  Filled 2019-02-08 (×9): qty 1

## 2019-02-08 MED ORDER — INSULIN ASPART 100 UNIT/ML ~~LOC~~ SOLN
0.0000 [IU] | Freq: Three times a day (TID) | SUBCUTANEOUS | Status: DC
  Administered 2019-02-08 – 2019-02-09 (×3): 5 [IU] via SUBCUTANEOUS
  Administered 2019-02-09 – 2019-02-10 (×3): 3 [IU] via SUBCUTANEOUS
  Administered 2019-02-10 (×2): 5 [IU] via SUBCUTANEOUS
  Administered 2019-02-11: 18:00:00 2 [IU] via SUBCUTANEOUS
  Administered 2019-02-11: 08:00:00 3 [IU] via SUBCUTANEOUS
  Administered 2019-02-11: 13:00:00 5 [IU] via SUBCUTANEOUS
  Administered 2019-02-12 (×2): 3 [IU] via SUBCUTANEOUS
  Filled 2019-02-08 (×13): qty 1

## 2019-02-08 MED ORDER — INSULIN GLARGINE 100 UNIT/ML ~~LOC~~ SOLN
14.0000 [IU] | Freq: Every day | SUBCUTANEOUS | Status: DC
  Administered 2019-02-09 – 2019-02-12 (×4): 14 [IU] via SUBCUTANEOUS
  Filled 2019-02-08 (×5): qty 0.14

## 2019-02-08 NOTE — Progress Notes (Signed)
[facility] at [place] NAME: April [name]    MR#:  [id number]  DATE OF BIRTH:  1945/12/06  SUBJECTIVE: Admitted for fall, right hip fracture also found to have UTI.  Status post ORIF of right hip yesterday.  Today she has cough, has minimal right hip pain.  CHIEF COMPLAINT:   Chief Complaint  Patient presents with  . Fall  . Hip Pain    REVIEW OF SYSTEMS:   ROS CONSTITUTIONAL: No fever, fatigue or weakness.  EYES: No blurred or double vision.  EARS, NOSE, AND THROAT: No tinnitus or ear pain.  RESPIRATORY: Has some cough.  CARDIOVASCULAR: No chest pain, orthopnea, edema.  GASTROINTESTINAL: No nausea, vomiting, diarrhea or abdominal pain.  GENITOURINARY: No dysuria, hematuria.  ENDOCRINE: No polyuria, nocturia,  HEMATOLOGY: No anemia, easy bruising or bleeding SKIN: No rash or lesion. MUSCULOSKELETAL right hip pain  NEUROLOGIC: No tingling, numbness, weakness.  PSYCHIATRY: No anxiety or depression.   DRUG ALLERGIES:   Allergies  Allergen Reactions  . Prednisone Anaphylaxis    VITALS:  Blood pressure (!) 122/55, pulse 79, temperature 97.6 F (36.4 C), temperature source Axillary, resp. rate 16, height 5\' 6"  (1.676 m), weight 117.9 kg, SpO2 96 %.  PHYSICAL EXAMINATION:  GENERAL:  73 y.o.-year-old patient lying in the bed with no acute distress.  EYES: Pupils equal, round, reactive to light and accommodation. No scleral icterus. Extraocular muscles intact.  HEENT: Head atraumatic, normocephalic. Oropharynx and nasopharynx clear.  NECK:  Supple, no jugular venous distention. No thyroid enlargement, no tenderness.  LUNGS: Diminished breath sound bilaterally.  Faint expiratory wheeze noted. CARDIOVASCULAR: S1, S2 normal. No murmurs, rubs, or gallops.  ABDOMEN: Soft, nontender, nondistended. Bowel sounds present. No organomegaly or mass.  EXTREMITIES: No pedal edema, cyanosis, or clubbing.  NEUROLOGIC: Cranial nerves II through  XII are intact. Muscle strength 5/5 in all extremities. Sensation intact. Gait not checked.  PSYCHIATRIC: The patient is alert and oriented x 3.  SKIN: No obvious rash, lesion, or ulcer.    LABORATORY PANEL:   CBC Recent Labs  Lab 02/08/19 0643  WBC 26.3*  HGB 11.9*  HCT 38.1  PLT 177   ------------------------------------------------------------------------------------------------------------------  Chemistries  Recent Labs  Lab 02/05/19 2122  02/06/19 0701  02/08/19 0643  NA 136   < >  --    < > 138  K 3.6   < >  --    < > 4.4  CL 96*   < >  --    < > 108  CO2 24   < >  --    < > 24  GLUCOSE 307*   < >  --    < > 248*  BUN 29*   < >  --    < > 32*  CREATININE 0.98   < >  --    < > 0.96  CALCIUM 9.0   < >  --    < > 7.7*  MG  --   --  2.3  --   --   AST 68*  --   --   --   --   ALT 42  --   --   --   --   ALKPHOS 94  --   --   --   --   BILITOT 1.2  --   --   --   --    < > = values in this interval not displayed.   ------------------------------------------------------------------------------------------------------------------  Cardiac Enzymes Recent Labs  Lab 02/05/19 2122  TROPONINI 0.03*   ------------------------------------------------------------------------------------------------------------------  RADIOLOGY:  Dg Hip Operative Unilat W Or W/o Pelvis Right  Result Date: 02/07/2019 CLINICAL DATA:  Intramedullary nail placement for proximal femur fracture EXAM: OPERATIVE RIGHT HIP/femur: 2  VIEWS TECHNIQUE: Fluoroscopic spot image(s) were submitted for interpretation post-operatively. FLUOROSCOPY TIME:  1 MINUTES 37 SECONDS; 4 ACQUIRED IMAGES COMPARISON:  Pelvis and right hip radiographs Feb 05, 2019 FINDINGS: Frontal and lateral views were obtained. There is screw and rod fixation through a comminuted fracture of the intertrochanteric and proximal subtrochanteric regions of the right proximal femur. Alignment at the fracture site is near anatomic. Tip of  the screw is in the proximal femoral head. The rod more distally is fixated 2 surgical screws. No dislocation. IMPRESSION: Surgical fixation through comminuted proximal femur fracture with alignment at the fracture site near anatomic. Tip of screw in proximal femoral head. No dislocation. Electronically Signed   By: Lowella Grip III M.D.   On: 02/07/2019 11:42   Dg Femur Port, Min 2 Views Right  Result Date: 02/07/2019 CLINICAL DATA:  Postop intramedullary nail. EXAM: RIGHT FEMUR PORTABLE 2 VIEW COMPARISON:  02/05/2019. FINDINGS: An intramedullary nail with a compression lag screw has been placed across the intertrochanteric fracture. Improved position and alignment. IMPRESSION: No adverse features. Electronically Signed   By: Staci Righter M.D.   On: 02/07/2019 12:52    EKG:   Orders placed or performed during the hospital encounter of 02/05/19  . ED EKG  . ED EKG    ASSESSMENT AND PLAN:   sepsis present on admission secondary to Pseudomonas UTI, discontinue vancomycin, continue cefepime, leukocytosis slowly improving.  No fever.  #2/.  Hypertensive urgency secondary to right hip pain, controlled.  #3/diabetes mellitus type 2, continue Lantus, sliding scale insulin with coverage adjusted the dose.  4.  History of peripheral artery disease, followed by vascular surgery . #5 r right hip fracture status post ORIF by Dr. Mack Guise yesterday, continue DVT prophylaxis, pain medicine, disposition to short-term rehab.   7 .slightly elevated troponin secondary to sepsis.  #8. cough, shortness of breath, chest x-ray to be repeated again, continue bronchodilators, add Mucinex, continue incentive spirometry use, Patient is refusing to use nebulizers, Mucinex but after talking to her about benefits of them she agreed.   More than 50% time spent in counseling, coordination of care.  .  All the records are reviewed and case discussed with Care Management/Social Workerr. Management plans  discussed with the patient, family and they are in agreement.  CODE STATUS: Full code  TOTAL TIME TAKING CARE OF THIS PATIENT: 38 minutes.   POSSIBLE D/C IN 1-2DAYS, DEPENDING ON CLINICAL CONDITION.   Epifanio Lesches M.D on 02/08/2019 at 11:27 AM  Between 7am to 6pm - Pager - [phone]  After 6pm go to [email or web address] - password [email or web address] [facility]  Office  [phone]  CC: Primary care physician; Albina Billet, MD   Note: This dictation was prepared with Dragon dictation along with smaller phrase technology. Any transcriptional errors that result from this process are unintentional.

## 2019-02-08 NOTE — Progress Notes (Signed)
[facility] at [place] NAME: April [name]    MR#:  [id number]  DATE OF BIRTH:  1946-04-06  SUBJECTIVE: Admitted for fall, right hip fracture also found to have UTI.  Status post ORIF of right hip yesterday.  Today she has cough, has minimal right hip pain.  CHIEF COMPLAINT:   Chief Complaint  Patient presents with  . Fall  . Hip Pain    REVIEW OF SYSTEMS:   ROS CONSTITUTIONAL: No fever, fatigue or weakness.  EYES: No blurred or double vision.  EARS, NOSE, AND THROAT: No tinnitus or ear pain.  RESPIRATORY: Has some cough.  CARDIOVASCULAR: No chest pain, orthopnea, edema.  GASTROINTESTINAL: No nausea, vomiting, diarrhea or abdominal pain.  GENITOURINARY: No dysuria, hematuria.  ENDOCRINE: No polyuria, nocturia,  HEMATOLOGY: No anemia, easy bruising or bleeding SKIN: No rash or lesion. MUSCULOSKELETAL right hip pain  NEUROLOGIC: No tingling, numbness, weakness.  PSYCHIATRY: No anxiety or depression.   DRUG ALLERGIES:   Allergies  Allergen Reactions  . Prednisone Anaphylaxis    VITALS:  Blood pressure (!) 122/55, pulse 79, temperature 97.6 F (36.4 C), temperature source Axillary, resp. rate 16, height 5\' 6"  (1.676 m), weight 117.9 kg, SpO2 96 %.  PHYSICAL EXAMINATION:  GENERAL:  73 y.o.-year-old patient lying in the bed with no acute distress.  EYES: Pupils equal, round, reactive to light and accommodation. No scleral icterus. Extraocular muscles intact.  HEENT: Head atraumatic, normocephalic. Oropharynx and nasopharynx clear.  NECK:  Supple, no jugular venous distention. No thyroid enlargement, no tenderness.  LUNGS: Diminished breath sound bilaterally.  Faint expiratory wheeze noted. CARDIOVASCULAR: S1, S2 normal. No murmurs, rubs, or gallops.  ABDOMEN: Soft, nontender, nondistended. Bowel sounds present. No organomegaly or mass.  EXTREMITIES: No pedal edema, cyanosis, or clubbing.  NEUROLOGIC: Cranial nerves II through  XII are intact. Muscle strength 5/5 in all extremities. Sensation intact. Gait not checked.  PSYCHIATRIC: The patient is alert and oriented x 3.  SKIN: No obvious rash, lesion, or ulcer.    LABORATORY PANEL:   CBC Recent Labs  Lab 02/08/19 0643  WBC 26.3*  HGB 11.9*  HCT 38.1  PLT 177   ------------------------------------------------------------------------------------------------------------------  Chemistries  Recent Labs  Lab 02/05/19 2122  02/06/19 0701  02/08/19 0643  NA 136   < >  --    < > 138  K 3.6   < >  --    < > 4.4  CL 96*   < >  --    < > 108  CO2 24   < >  --    < > 24  GLUCOSE 307*   < >  --    < > 248*  BUN 29*   < >  --    < > 32*  CREATININE 0.98   < >  --    < > 0.96  CALCIUM 9.0   < >  --    < > 7.7*  MG  --   --  2.3  --   --   AST 68*  --   --   --   --   ALT 42  --   --   --   --   ALKPHOS 94  --   --   --   --   BILITOT 1.2  --   --   --   --    < > = values in this interval not displayed.   ------------------------------------------------------------------------------------------------------------------  Cardiac Enzymes Recent Labs  Lab 02/05/19 2122  TROPONINI 0.03*   ------------------------------------------------------------------------------------------------------------------  RADIOLOGY:  Dg Hip Operative Unilat W Or W/o Pelvis Right  Result Date: 02/07/2019 CLINICAL DATA:  Intramedullary nail placement for proximal femur fracture EXAM: OPERATIVE RIGHT HIP/femur: 2  VIEWS TECHNIQUE: Fluoroscopic spot image(s) were submitted for interpretation post-operatively. FLUOROSCOPY TIME:  1 MINUTES 37 SECONDS; 4 ACQUIRED IMAGES COMPARISON:  Pelvis and right hip radiographs Feb 05, 2019 FINDINGS: Frontal and lateral views were obtained. There is screw and rod fixation through a comminuted fracture of the intertrochanteric and proximal subtrochanteric regions of the right proximal femur. Alignment at the fracture site is near anatomic. Tip of  the screw is in the proximal femoral head. The rod more distally is fixated 2 surgical screws. No dislocation. IMPRESSION: Surgical fixation through comminuted proximal femur fracture with alignment at the fracture site near anatomic. Tip of screw in proximal femoral head. No dislocation. Electronically Signed   By: Lowella Grip III M.D.   On: 02/07/2019 11:42   Dg Femur Port, Min 2 Views Right  Result Date: 02/07/2019 CLINICAL DATA:  Postop intramedullary nail. EXAM: RIGHT FEMUR PORTABLE 2 VIEW COMPARISON:  02/05/2019. FINDINGS: An intramedullary nail with a compression lag screw has been placed across the intertrochanteric fracture. Improved position and alignment. IMPRESSION: No adverse features. Electronically Signed   By: Staci Righter M.D.   On: 02/07/2019 12:52    EKG:   Orders placed or performed during the hospital encounter of 02/05/19  . ED EKG  . ED EKG    ASSESSMENT AND PLAN:   sepsis present on admission secondary to Pseudomonas UTI, discontinue vancomycin, continue cefepime, leukocytosis slowly improving.  No fever.  #2/.  Hypertensive urgency secondary to right hip pain, controlled.  #3/diabetes mellitus type 2, continue Lantus, sliding scale insulin with coverage adjusted the dose.  4.  History of peripheral artery disease, followed by vascular surgery . #5 r right hip fracture status post ORIF by Dr. Mack Guise yesterday, continue DVT prophylaxis, pain medicine, disposition to short-term rehab.   7 .slightly elevated troponin secondary to sepsis.  #8. cough, shortness of breath, chest x-ray to be repeated again, continue bronchodilators, add Mucinex, continue incentive spirometry use, Patient is refusing to use nebulizers, Mucinex but after talking to her about benefits of them she agreed.   More than 50% time spent in counseling, coordination of care.  .  All the records are reviewed and case discussed with Care Management/Social Workerr. Management plans  discussed with the patient, family and they are in agreement.  CODE STATUS: Full code  TOTAL TIME TAKING CARE OF THIS PATIENT: 38 minutes.   POSSIBLE D/C IN 1-2DAYS, DEPENDING ON CLINICAL CONDITION.   Epifanio Lesches M.D on 02/08/2019 at 11:20 AM  Between 7am to 6pm - Pager - [phone]  After 6pm go to [email or web address] - password [email or web address] [facility]  Office  [phone]  CC: Primary care physician; Albina Billet, MD   Note: This dictation was prepared with Dragon dictation along with smaller phrase technology. Any transcriptional errors that result from this process are unintentional.

## 2019-02-08 NOTE — Progress Notes (Addendum)
Subjective:  POD #1 s/p intramedullary fixation for right intertrochanteric hip fracture with subtrochanteric extension.   Patient reports right hip pain as mild.  Patient states she was able to tolerate physical therapy today.  Patient continues to receive antibiotic treatment for Pseudomonas UTI.  Objective:   VITALS:   Vitals:   02/08/19 1146 02/08/19 1348 02/08/19 1545 02/08/19 1703  BP:    (!) 134/55  Pulse:    84  Resp:    16  Temp:    98.2 F (36.8 C)  TempSrc:    Oral  SpO2: 97% 97% 92% 90%  Weight:      Height:        PHYSICAL EXAM: Right lower extremity Neurovascular intact Sensation intact distally Intact pulses distally Dorsiflexion/Plantar flexion intact Incision: scant drainage No cellulitis present Compartment soft  LABS  Results for orders placed or performed during the hospital encounter of 02/05/19 (from the past 24 hour(s))  Glucose, capillary     Status: Abnormal   Collection Time: 02/07/19  8:18 PM  Result Value Ref Range   Glucose-Capillary 377 (H) 70 - 99 mg/dL  Glucose, capillary     Status: Abnormal   Collection Time: 02/08/19 12:01 AM  Result Value Ref Range   Glucose-Capillary 277 (H) 70 - 99 mg/dL  Glucose, capillary     Status: Abnormal   Collection Time: 02/08/19  3:58 AM  Result Value Ref Range   Glucose-Capillary 246 (H) 70 - 99 mg/dL  Glucose, capillary     Status: Abnormal   Collection Time: 02/08/19  5:47 AM  Result Value Ref Range   Glucose-Capillary 262 (H) 70 - 99 mg/dL  CBC     Status: Abnormal   Collection Time: 02/08/19  6:43 AM  Result Value Ref Range   WBC 26.3 (H) 4.0 - 10.5 K/uL   RBC 3.95 3.87 - 5.11 MIL/uL   Hemoglobin 11.9 (L) 12.0 - 15.0 g/dL   HCT 38.1 36.0 - 46.0 %   MCV 96.5 80.0 - 100.0 fL   MCH 30.1 26.0 - 34.0 pg   MCHC 31.2 30.0 - 36.0 g/dL   RDW 14.5 11.5 - 15.5 %   Platelets 177 150 - 400 K/uL   nRBC 0.0 0.0 - 0.2 %  Basic metabolic panel     Status: Abnormal   Collection Time: 02/08/19  6:43 AM   Result Value Ref Range   Sodium 138 135 - 145 mmol/L   Potassium 4.4 3.5 - 5.1 mmol/L   Chloride 108 98 - 111 mmol/L   CO2 24 22 - 32 mmol/L   Glucose, Bld 248 (H) 70 - 99 mg/dL   BUN 32 (H) 8 - 23 mg/dL   Creatinine, Ser 0.96 0.44 - 1.00 mg/dL   Calcium 7.7 (L) 8.9 - 10.3 mg/dL   GFR calc non Af Amer 59 (L) >60 mL/min   GFR calc Af Amer >60 >60 mL/min   Anion gap 6 5 - 15  Glucose, capillary     Status: Abnormal   Collection Time: 02/08/19  8:33 AM  Result Value Ref Range   Glucose-Capillary 208 (H) 70 - 99 mg/dL   Comment 1 Notify RN   Glucose, capillary     Status: Abnormal   Collection Time: 02/08/19 11:45 AM  Result Value Ref Range   Glucose-Capillary 216 (H) 70 - 99 mg/dL   Comment 1 Notify RN   Glucose, capillary     Status: Abnormal   Collection Time: 02/08/19  5:03  PM  Result Value Ref Range   Glucose-Capillary 212 (H) 70 - 99 mg/dL   Comment 1 Notify RN     Dg Chest Port 1 View  Result Date: 02/08/2019 CLINICAL DATA:  73 year old female with a history of cough EXAM: PORTABLE CHEST 1 VIEW COMPARISON:  02/05/2019 FINDINGS: Cardiomediastinal silhouette unchanged in size and contour. Low lung volumes accentuates the interstitium and the vasculature. No interlobular septal thickening. Linear opacities at the lung bases. No pneumothorax. Coarsened interstitial markings throughout. No confluent airspace disease. IMPRESSION: Low lung volumes with likely basilar atelectasis and a background of chronic changes. Electronically Signed   By: Corrie Mckusick D.O.   On: 02/08/2019 13:14   Dg Hip Operative Unilat W Or W/o Pelvis Right  Result Date: 02/07/2019 CLINICAL DATA:  Intramedullary nail placement for proximal femur fracture EXAM: OPERATIVE RIGHT HIP/femur: 2  VIEWS TECHNIQUE: Fluoroscopic spot image(s) were submitted for interpretation post-operatively. FLUOROSCOPY TIME:  1 MINUTES 37 SECONDS; 4 ACQUIRED IMAGES COMPARISON:  Pelvis and right hip radiographs Feb 05, 2019 FINDINGS:  Frontal and lateral views were obtained. There is screw and rod fixation through a comminuted fracture of the intertrochanteric and proximal subtrochanteric regions of the right proximal femur. Alignment at the fracture site is near anatomic. Tip of the screw is in the proximal femoral head. The rod more distally is fixated 2 surgical screws. No dislocation. IMPRESSION: Surgical fixation through comminuted proximal femur fracture with alignment at the fracture site near anatomic. Tip of screw in proximal femoral head. No dislocation. Electronically Signed   By: Lowella Grip III M.D.   On: 02/07/2019 11:42   Dg Femur Port, Min 2 Views Right  Result Date: 02/07/2019 CLINICAL DATA:  Postop intramedullary nail. EXAM: RIGHT FEMUR PORTABLE 2 VIEW COMPARISON:  02/05/2019. FINDINGS: An intramedullary nail with a compression lag screw has been placed across the intertrochanteric fracture. Improved position and alignment. IMPRESSION: No adverse features. Electronically Signed   By: Staci Righter M.D.   On: 02/07/2019 12:52    Assessment/Plan: 1 Day Post-Op   Active Problems:   S/P right hip fracture  Patient is stable postop following intramedullary fixation.  Hemoglobin and hematocrit are stable.  Continue physical therapy as the patient can tolerate.  Appreciate hospitalist treatment of Pseudomonas UTI and controlling patient's type 2 diabetes.  Patient is on Lovenox for DVT prophylaxis.  She should be discharged on 4 weeks of Lovenox.  Patient is weightbearing as tolerated on the right lower extremity.  She will follow-up with me in the office in approximately 10-14 days following discharge.   Thornton Park , MD 02/08/2019, 5:40 PM

## 2019-02-08 NOTE — Care Management Important Message (Signed)
Important Message  Patient Details  Name: April [name] MRN: [id number] Date of Birth: Jul 25, 1946   Medicare Important Message Given:  Yes    Juliann Pulse A Marana 02/08/2019, 10:49 AM

## 2019-02-08 NOTE — Progress Notes (Signed)
Inpatient Diabetes Program Recommendations  AACE/ADA: New Consensus Statement on Inpatient Glycemic Control (2015)  Target Ranges:  Prepandial:   less than 140 mg/dL      Peak postprandial:   less than 180 mg/dL (1-2 hours)      Critically ill patients:  140 - 180 mg/dL   Lab Results  Component Value Date   GLUCAP 208 (H) 02/08/2019   HGBA1C 6.7 (H) 02/06/2019    Review of Glycemic Control Results for LIISA, PICONE (MRN [id number]) as of 02/08/2019 10:58  Ref. Range 02/07/2019 20:18 02/08/2019 00:01 02/08/2019 03:58 02/08/2019 05:47 02/08/2019 08:33  Glucose-Capillary Latest Ref Range: 70 - 99 mg/dL 377 (H) 277 (H) 246 (H) 262 (H) 208 (H)   Diabetes history: DM 2 Outpatient Diabetes medications:  Lantus 15 units daily, Metformin 500 mg bid Current orders for Inpatient glycemic control:  Novolog sensitive q 4 hours, Lantus 8 units daily  Inpatient Diabetes Program Recommendations:   Of note, patient did receive Decadron 10 mg prior to surgery which likely increased CBG's.  Please consider increasing Lantus to 14 units daily.  Also consider changing Novolog correction to tid with meals and HS.   Thanks,  Adah Perl, RN, BC-ADM Inpatient Diabetes Coordinator Pager [phone]

## 2019-02-08 NOTE — Evaluation (Signed)
Occupational Therapy Evaluation Patient Details Name: April [name] MRN: [id number] DOB: April 08, 1946 Today's Date: 02/08/2019    History of Present Illness 73 yo Female fell at home and sustained a right hip fracture. She is POD#1 for RLE ORIF. Upon admission she was found to have UTI with elevated glucose levels. Patient is WBAT. PMH: ARF, HTN, NSTEMI, A-fib, Sepsis, Diabetes x2, past partial foot ambulation bilaterally;    Clinical Impression   Pt seen for OT evaluation and co-tx with PT this date following PT treatment session. Pt reports being independent with basic ADL and driving prior to admission, requiring assist from [place] (recently down from 2-3x/wk) for cooking and cleaning. Pt indep with med mgt and endorses only the 1 fall. Pt currently requires Mod assist x2 for functional transfers for ADL tasks. Pt instructed in AE/DME to improve indep/safety with ADL tasks. Pt instructed in pursed lip breathing to support recovery after exertion. Pt currently on 3L O2, no home O2 prior. Pt instructed in TED hose mgt including donning/doffing, wear schedule, and positioning. Pt verbalized understanding but will benefit from skilled OT services to maximize return to PLOF and support recall/carryover of learned techniques. Recommend STR upon discharge.     Follow Up Recommendations  SNF    Equipment Recommendations  None recommended by OT    Recommendations for Other Services       Precautions / Restrictions Precautions Precautions: Fall Restrictions Weight Bearing Restrictions: Yes RLE Weight Bearing: Weight bearing as tolerated      Mobility Bed Mobility Overal bed mobility: Needs Assistance Bed Mobility: Sit to Supine     Supine to sit: HOB elevated;Mod assist Sit to supine: Mod assist;+2 for physical assistance   General bed mobility comments: with bed rail and extra time required; requires +2 for lifting LE into bed and for controlling trunk position as transitioning to  supine;   Transfers Overall transfer level: Needs assistance Equipment used: Rolling walker (2 wheeled) Transfers: Sit to/from Stand Sit to Stand: +2 physical assistance;+2 safety/equipment;From elevated surface;Mod assist         General transfer comment: Significant extra time required as patient requires cues for hand placement, body position and encouragement to participate in transfer. Despite repeated counting, encouragement, patient hesitant to transfer due to right hip pain; However, after 3+ attempts, patient able to transfer sit<>Stand with mod A +2 with RW with heavy push through UE onto RW. Required mod VCs to increase forward lean and to increase eret posture upon standing;     Balance Overall balance assessment: Needs assistance Sitting-balance support: Bilateral upper extremity supported;Feet supported Sitting balance-Leahy Scale: Fair Sitting balance - Comments: able to advance trunk forward better this PM as compared to morning session;  Postural control: Posterior lean Standing balance support: Bilateral upper extremity supported;During functional activity Standing balance-Leahy Scale: Poor Standing balance comment: +2 assist with RW for transfer/gait                           ADL either performed or assessed with clinical judgement   ADL Overall ADL's : Needs assistance/impaired Eating/Feeding: Sitting;Independent   Grooming: Sitting;Independent   Upper Body Bathing: Sitting;Minimal assistance   Lower Body Bathing: Sitting/lateral leans;Maximal assistance   Upper Body Dressing : Sitting;Minimal assistance   Lower Body Dressing: Sitting/lateral leans;Maximal assistance   Toilet Transfer: RW;Stand-pivot;BSC;+2 for physical assistance;Moderate assistance  Vision Baseline Vision/History: Wears glasses Wears Glasses: Reading only Patient Visual Report: No change from baseline Vision Assessment?: No apparent visual deficits      Perception     Praxis      Pertinent Vitals/Pain Pain Assessment: No/denies pain Pain Score: 8  Pain Location: denies R hip pain at rest, increases to 5/10 with movement Pain Descriptors / Indicators: Aching Pain Intervention(s): Limited activity within patient's tolerance;Monitored during session;Premedicated before session;Repositioned     Hand Dominance Right   Extremity/Trunk Assessment Upper Extremity Assessment Upper Extremity Assessment: Overall WFL for tasks assessed   Lower Extremity Assessment Lower Extremity Assessment: Defer to PT evaluation;RLE deficits/detail;LLE deficits/detail RLE Deficits / Details: hip not assessed; knee 3/5, foot 3/5; intact light touch sensation;  LLE Deficits / Details: intact light touch sensation, ROM is WFL, strength grossly 4/5       Communication Communication Communication: No difficulties   Cognition Arousal/Alertness: Awake/alert Behavior During Therapy: WFL for tasks assessed/performed Overall Cognitive Status: Within Functional Limits for tasks assessed                                 General Comments: pt oriented to situation and place; however she does seem to be somewhat confused. Pt at times with conflicting response   General Comments       Exercises Other Exercises Other Exercises: pt instructed pursed lip breathing, compression stocking mgt, DME/AE for ADL tasks, home/routines modifications, and falls prevention strategies   Shoulder Instructions      Home Living Family/patient expects to be discharged to:: Private residence Living Arrangements: Alone Available Help at Discharge: Friend(s);Personal care attendant;Available PRN/intermittently;Other (Comment)(has home health aide 2-3x a week) Type of Home: House Home Access: Stairs to enter [employer] of Steps: 5 in back, 1 on side; uses side entrance most of the time Entrance Stairs-Rails: Right;Left Home Layout: One level      Bathroom Shower/Tub: Occupational psychologist: Handicapped height     Home Equipment: Environmental consultant - 2 wheels;Cane - single point;Walker - 4 wheels;Shower seat;Grab bars - tub/shower;Adaptive equipment Adaptive Equipment: Reacher        Prior Functioning/Environment Level of Independence: Needs assistance  Gait / Transfers Assistance Needed: used RW for most ambulation;  ADL's / Homemaking Assistance Needed: had home health aide 2-3 times a week, now down to 1x/wk; home aide would assist with cooking and cleaning and would assist with bathing as needed; pt indep with med mgt   Comments: only the 1 fall leading to this admission        OT Problem List: Decreased strength;Decreased knowledge of use of DME or AE;Obesity;Decreased range of motion;Decreased activity tolerance;Pain;Decreased safety awareness;Impaired balance (sitting and/or standing)      OT Treatment/Interventions: Self-care/ADL training;Balance training;Therapeutic exercise;Therapeutic activities;DME and/or AE instruction;Patient/family education    OT Goals(Current goals can be found in the care plan section) Acute Rehab OT Goals Patient Stated Goal: to get better OT Goal Formulation: With patient Time For Goal Achievement: 02/22/19 Potential to Achieve Goals: Good ADL Goals Pt Will Perform Lower Body Dressing: with mod assist;sit to/from stand;with max assist;with adaptive equipment Pt Will Transfer to Toilet: with mod assist;bedside commode;ambulating(LRAD for amb, Mod x1) Additional ADL Goal #1: Pt will independently instruct family/caregiver in compression stocking mgt including donning/doffing, wear schedule, and positioning.  OT Frequency: Min 1X/week   Barriers to D/C: Decreased caregiver support  Co-evaluation PT/OT/SLP Co-Evaluation/Treatment: Yes Reason for Co-Treatment: For patient/therapist safety;To address functional/ADL transfers PT goals addressed during session: Mobility/safety  with mobility;Balance;Proper use of DME;Strengthening/ROM OT goals addressed during session: ADL's and self-care;Proper use of Adaptive equipment and DME;Strengthening/ROM      AM-PAC OT "6 Clicks" Daily Activity     Outcome Measure Help from another person eating meals?: None Help from another person taking care of personal grooming?: None Help from another person toileting, which includes using toliet, bedpan, or urinal?: Total Help from another person bathing (including washing, rinsing, drying)?: A Lot Help from another person to put on and taking off regular upper body clothing?: A Lot Help from another person to put on and taking off regular lower body clothing?: A Lot 6 Click Score: 15   End of Session Equipment Utilized During Treatment: Gait belt;Rolling walker;Oxygen  Activity Tolerance: Patient tolerated treatment well Patient left: in bed;with call bell/phone within reach;with bed alarm set;with SCD's reapplied  OT Visit Diagnosis: Other abnormalities of gait and mobility (R26.89);History of falling (Z91.81);Pain Pain - Right/Left: Right Pain - part of body: Hip                Time: 8466-5993 OT Time Calculation (min): 41 min Charges:  OT General Charges $OT Visit: 1 Visit OT Evaluation $OT Eval Moderate Complexity: 1 Mod OT Treatments $Self Care/Home Management : 8-22 mins  Jeni Salles, MPH, MS, OTR/L ascom [phone] 02/08/19, 2:39 PM

## 2019-02-08 NOTE — Evaluation (Signed)
Physical Therapy Evaluation Patient Details Name: April [name] MRN: [id number] DOB: 11/23/1945 Today's Date: 02/08/2019   History of Present Illness  73 yo Female fell at home and sustained a right hip fracture. She is POD#1 for RLE ORIF. Upon admission she was found to have UTI with elevated glucose levels. Patient is WBAT. PMH: ARF, HTN, NSTEMI, A-fib, Sepsis, Diabetes x2, past partial foot ambulation bilaterally;   Clinical Impression  73 yo Female s/p RLE ORIF secondary to hip fracture. Patient is s/p recent fall. She was living at home alone prior to admittance. She did report having a home health aide that came 2-3x a week to assist with cooking and cleaning and would assist with some bathing. Patient reports walking with rollator for most ambulation prior to admittance. Currently she requires mod A +1 for bed mobility with elevated head of bed and significant increased time. She required max A +2 for sit<>Stand transfer from elevated bed with increased time and mod-max VCs for hand placement and body positioning. Patient hesitant to move at all due to increased RLE hip pain and fatigue. She required heavy cues and motivation for participation in PT evaluation and mobility. Patient was able to get to bedside chair with max A +2 with increased time and +2 for physical assistance and walker management. SPo2 remained >95% while on 3L O2, although after transferring to bedside chair, patient reported, "I am going to black out." Vitals monitored, encouraged pursed lip breathing. RN came into room to assess patient. Patient stable. It is likely that she is experiencing low endurance and increased pain as she is post op day #1. Patient would benefit from additional skilled PT intervention to improve strength, balance and mobility; SNF rehab recommended upon discharge. Patient left in chair with needs in reach;     Follow Up Recommendations SNF;Supervision/Assistance - 24 hour    Equipment  Recommendations  Rolling walker with 5" wheels    Recommendations for Other Services Rehab consult     Precautions / Restrictions Precautions Precautions: Fall Restrictions Weight Bearing Restrictions: Yes RLE Weight Bearing: Weight bearing as tolerated      Mobility  Bed Mobility Overal bed mobility: Needs Assistance Bed Mobility: Supine to Sit     Supine to sit: HOB elevated;Mod assist     General bed mobility comments: with bed rail and extra time required; required mod VCs for positioning and scooting;   Transfers Overall transfer level: Needs assistance Equipment used: Rolling walker (2 wheeled) Transfers: Sit to/from Stand Sit to Stand: Max assist;+2 physical assistance;+2 safety/equipment;From elevated surface         General transfer comment: Required +2 for safety and max VCs for hand placement and body positioning; elevated bed; extra time required; Mod VCs for forward weight shift and to increase push through LLE x2 reps; (x12 min treatment, extra time required with cues for motivation)  Ambulation/Gait Ambulation/Gait assistance: Mod assist;+2 physical assistance;+2 safety/equipment Gait Distance (Feet): 3 Feet Assistive device: Rolling walker (2 wheeled) Gait Pattern/deviations: Step-to pattern;Decreased step length - right;Decreased step length - left;Decreased dorsiflexion - right;Decreased dorsiflexion - left;Decreased stance time - right;Shuffle Gait velocity: decreased   General Gait Details: patient able to take a few steps to bedside chair, extra time required, with mod VCs for foot placement, +2 assist for RW placement/control;   Stairs            Wheelchair Mobility    Modified Rankin (Stroke Patients Only)       Balance Overall balance  assessment: Needs assistance Sitting-balance support: Bilateral upper extremity supported;Feet supported Sitting balance-Leahy Scale: Poor Sitting balance - Comments: exhibits heavy posterior lean  requiring min A to keep upright posture; with prolonged sitting (>5 min) able to progress to sitting without UE assist, supervision;  Postural control: Posterior lean Standing balance support: Bilateral upper extremity supported;During functional activity Standing balance-Leahy Scale: Zero Standing balance comment: +2 physical assist for transfer                             Pertinent Vitals/Pain Pain Assessment: 0-10 Pain Score: 8  Pain Location: right hip Pain Descriptors / Indicators: Burning;Sharp;Tingling Pain Intervention(s): Limited activity within patient's tolerance;Premedicated before session;Patient requesting pain meds-RN notified;Monitored during session;Repositioned    Home Living Family/patient expects to be discharged to:: Private residence Living Arrangements: Alone Available Help at Discharge: Friend(s);Personal care attendant;Available PRN/intermittently;Other (Comment)(has home health aide 2-3x a week) Type of Home: House Home Access: Stairs to enter Entrance Stairs-Rails: Right;Left Entrance Stairs-Number of Steps: 5 in back, 1 on side; uses side entrance most of the time Home Layout: One level Home Equipment: Walker - 2 wheels;Cane - single point;Walker - 4 wheels;Shower seat;Grab bars - tub/shower      Prior Function Level of Independence: Needs assistance   Gait / Transfers Assistance Needed: used RW for most ambulation;   ADL's / Homemaking Assistance Needed: had home health aide 2-3 times a week; home aide would assist with cooking and cleaning and would assist with bathing as needed;         Hand Dominance   Dominant Hand: Right    Extremity/Trunk Assessment   Upper Extremity Assessment Upper Extremity Assessment: Overall WFL for tasks assessed    Lower Extremity Assessment Lower Extremity Assessment: RLE deficits/detail;LLE deficits/detail RLE Deficits / Details: hip not assessed; knee 3/5, foot 3/5; intact light touch sensation;   LLE Deficits / Details: intact light touch sensation, ROM is WFL, strength grossly 4/5       Communication   Communication: No difficulties  Cognition Arousal/Alertness: Awake/alert Behavior During Therapy: WFL for tasks assessed/performed Overall Cognitive Status: Within Functional Limits for tasks assessed                                 General Comments: pt oriented to situation and place; however she does seem to be somewhat confused. Pt at times with conflicting response      General Comments      Exercises     Assessment/Plan    PT Assessment Patient needs continued PT services  PT Problem List Decreased strength;Decreased mobility;Decreased safety awareness;Decreased range of motion;Decreased activity tolerance;Decreased balance;Pain       PT Treatment Interventions DME instruction;Functional mobility training;Balance training;Patient/family education;Gait training;Therapeutic activities;Stair training;Therapeutic exercise;Wheelchair mobility training    PT Goals (Current goals can be found in the Care Plan section)  Acute Rehab PT Goals Patient Stated Goal: to get better PT Goal Formulation: With patient Time For Goal Achievement: 02/22/19 Potential to Achieve Goals: Good    Frequency BID   Barriers to discharge Inaccessible home environment;Decreased caregiver support has steps to enter, lives alone    Co-evaluation               AM-PAC PT "6 Clicks" Mobility  Outcome Measure Help needed turning from your back to your side while in a flat bed without using bedrails?: A Lot Help needed  moving from lying on your back to sitting on the side of a flat bed without using bedrails?: A Lot Help needed moving to and from a bed to a chair (including a wheelchair)?: A Lot Help needed standing up from a chair using your arms (e.g., wheelchair or bedside chair)?: A Lot Help needed to walk in hospital room?: Total Help needed climbing 3-5 steps with  a railing? : Total 6 Click Score: 10    End of Session Equipment Utilized During Treatment: Gait belt Activity Tolerance: Patient limited by fatigue;Patient limited by pain Patient left: in chair;with call bell/phone within reach;with chair alarm set Nurse Communication: Mobility status PT Visit Diagnosis: Unsteadiness on feet (R26.81);Muscle weakness (generalized) (M62.81);Pain Pain - Right/Left: Right Pain - part of body: Hip    Time: 7209-4709 PT Time Calculation (min) (ACUTE ONLY): 75 min   Charges:   PT Evaluation $PT Eval Moderate Complexity: 1 Mod PT Treatments $Therapeutic Activity: 8-22 mins          Pamela Maddy PT, DPT 02/08/2019, 12:04 PM

## 2019-02-08 NOTE — Progress Notes (Signed)
Physical Therapy Treatment Patient Details Name: April [name] MRN: [id number] DOB: 10-Jan-1946 Today's Date: 02/08/2019    History of Present Illness 73 yo Female fell at home and sustained a right hip fracture. She is POD#1 for RLE ORIF. Upon admission she was found to have UTI with elevated glucose levels. Patient is WBAT. PMH: ARF, HTN, NSTEMI, A-fib, Sepsis, Diabetes x2, past partial foot ambulation bilaterally;     PT Comments    Patient seen for co-treat today with OT secondary need for +2 for patient safety and to address ADLs and mobility. Patient continues to require heavy cues and extra time to complete task.  Patient requires mod A +2 for transfer and was able to take a few steps to bed with RW. Patient reports no pain to right hip while sitting at rest, but reports increased pain upon weight bearing. She requires mod A +2 for bed mobility. Patient unable to tolerate LE exercise at this time due to fatigue. In addition she is often distracted and requires increased cues for redirect to task. Patient would benefit from additional skilled PT intervention to improve strength, balance and gait safety;   Follow Up Recommendations  SNF;Supervision/Assistance - 24 hour     Equipment Recommendations  Rolling walker with 5" wheels    Recommendations for Other Services Rehab consult     Precautions / Restrictions Precautions Precautions: Fall Restrictions Weight Bearing Restrictions: Yes RLE Weight Bearing: Weight bearing as tolerated    Mobility  Bed Mobility Overal bed mobility: Needs Assistance Bed Mobility: Sit to Supine     Supine to sit: HOB elevated;Mod assist Sit to supine: Mod assist;+2 for physical assistance   General bed mobility comments: with bed rail and extra time required; requires +2 for lifting LE into bed and for controlling trunk position as transitioning to supine;   Transfers Overall transfer level: Needs assistance Equipment used: Rolling walker (2  wheeled) Transfers: Sit to/from Stand Sit to Stand: +2 physical assistance;+2 safety/equipment;From elevated surface;Mod assist         General transfer comment: Significant extra time required as patient requires cues for hand placement, body position and encouragement to participate in transfer. Despite repeated counting, encouragement, patient hesitant to transfer due to right hip pain; However, after 3+ attempts, patient able to transfer sit<>Stand with mod A +2 with RW with heavy push through UE onto RW. Required mod VCs to increase forward lean and to increase eret posture upon standing;   Ambulation/Gait Ambulation/Gait assistance: Mod assist;+2 physical assistance;+2 safety/equipment Gait Distance (Feet): 3 Feet Assistive device: Rolling walker (2 wheeled) Gait Pattern/deviations: Step-to pattern;Decreased step length - right;Decreased step length - left;Decreased dorsiflexion - right;Decreased dorsiflexion - left;Decreased stance time - right;Shuffle Gait velocity: decreased   General Gait Details: patient able to take a few steps to bedside chair, extra time required, with mod VCs for foot placement, +2 assist for RW placement/control;    Stairs             Wheelchair Mobility    Modified Rankin (Stroke Patients Only)       Balance Overall balance assessment: Needs assistance Sitting-balance support: Bilateral upper extremity supported;Feet supported Sitting balance-Leahy Scale: Fair Sitting balance - Comments: able to advance trunk forward better this PM as compared to morning session;  Postural control: Posterior lean Standing balance support: Bilateral upper extremity supported;During functional activity Standing balance-Leahy Scale: Poor Standing balance comment: +2 assist with RW for transfer/gait  Cognition Arousal/Alertness: Awake/alert Behavior During Therapy: WFL for tasks assessed/performed Overall Cognitive  Status: Within Functional Limits for tasks assessed                                 General Comments: pt oriented to situation and place; however she does seem to be somewhat confused. Pt at times with conflicting response      Exercises Other Exercises Other Exercises: pt instructed pursed lip breathing, compression stocking mgt, DME/AE for ADL tasks, home/routines modifications, and falls prevention strategies    General Comments        Pertinent Vitals/Pain Pain Assessment: No/denies pain Pain Score: 8  Pain Location: denies R hip pain at rest, increases to 5/10 with movement Pain Descriptors / Indicators: Aching Pain Intervention(s): Limited activity within patient's tolerance;Monitored during session;Premedicated before session;Repositioned    Home Living Family/patient expects to be discharged to:: Private residence Living Arrangements: Alone Available Help at Discharge: Friend(s);Personal care attendant;Available PRN/intermittently;Other (Comment)(has home health aide 2-3x a week) Type of Home: House Home Access: Stairs to enter Entrance Stairs-Rails: Right;Left Home Layout: One level Home Equipment: Environmental consultant - 2 wheels;Cane - single point;Walker - 4 wheels;Shower seat;Grab bars - tub/shower;Adaptive equipment      Prior Function Level of Independence: Needs assistance  Gait / Transfers Assistance Needed: used RW for most ambulation;  ADL's / Homemaking Assistance Needed: had home health aide 2-3 times a week, now down to 1x/wk; home aide would assist with cooking and cleaning and would assist with bathing as needed; pt indep with med mgt Comments: only the 1 fall leading to this admission   PT Goals (current goals can now be found in the care plan section) Acute Rehab PT Goals Patient Stated Goal: to get better PT Goal Formulation: With patient Time For Goal Achievement: 02/22/19 Potential to Achieve Goals: Good Progress towards PT goals: Progressing  toward goals    Frequency    BID      PT Plan Current plan remains appropriate    Co-evaluation PT/OT/SLP Co-Evaluation/Treatment: Yes Reason for Co-Treatment: For patient/therapist safety;To address functional/ADL transfers PT goals addressed during session: Mobility/safety with mobility;Balance;Proper use of DME;Strengthening/ROM OT goals addressed during session: ADL's and self-care;Proper use of Adaptive equipment and DME;Strengthening/ROM      AM-PAC PT "6 Clicks" Mobility   Outcome Measure  Help needed turning from your back to your side while in a flat bed without using bedrails?: A Lot Help needed moving from lying on your back to sitting on the side of a flat bed without using bedrails?: A Lot Help needed moving to and from a bed to a chair (including a wheelchair)?: A Lot Help needed standing up from a chair using your arms (e.g., wheelchair or bedside chair)?: A Lot Help needed to walk in hospital room?: Total Help needed climbing 3-5 steps with a railing? : Total 6 Click Score: 10    End of Session Equipment Utilized During Treatment: Gait belt Activity Tolerance: Patient limited by fatigue;Patient limited by pain Patient left: in bed;with bed alarm set;with call bell/phone within reach Nurse Communication: Mobility status PT Visit Diagnosis: Unsteadiness on feet (R26.81);Muscle weakness (generalized) (M62.81);Pain Pain - Right/Left: Right Pain - part of body: Hip     Time: 1316-1340 PT Time Calculation (min) (ACUTE ONLY): 24 min  Charges:  $Therapeutic Activity: 23-37 mins  Aric Jost,MargaretPT, DPT 02/08/2019, 2:33 PM

## 2019-02-08 NOTE — TOC Progression Note (Signed)
Transition of Care [facility]) - Progression Note    Patient Details  Name: MARALEE HIGUCHI MRN: [id number] Date of Birth: 05/05/1946  Transition of Care [facility]) CM/SW [place], RN Phone Number: 02/08/2019, 12:18 PM  Clinical Narrative:     Patient accepted bed offer with [facility], Notified Claiborne Billings, Started insurance auth spoke to [place] at [facility] reference number [id number], Selena Lesser will need to be notified when Insurance Josem Kaufmann is received and when DC is ready, will need to send DC summary and orders  Expected Discharge Plan: [place] Barriers to Discharge: Continued Medical Work up  Expected Discharge Plan and Services Expected Discharge Plan: [place]   Discharge Planning Services: CM Consult Post Acute Care Choice: [place] Living arrangements for the past 2 months: Single Family Home                                       Social Determinants of Health (SDOH) Interventions    Readmission Risk Interventions No flowsheet data found.

## 2019-02-08 NOTE — TOC Progression Note (Signed)
Transition of Care [facility]) - Progression Note    Patient Details  Name: April [name] MRN: [id number] Date of Birth: Mar 19, 1946  Transition of Care [facility]) CM/SW [place], RN Phone Number: 02/08/2019, 2:56 PM  Clinical Narrative:    Gary called and notified me that due to the patient having a fever yesterday she will need a new COvid test showing as negative before she will be able to come to the facility when she discharges, I notified the Physician and requested them to order a new covid test so that when the patient is medically cleared she will be ready to DC with no barriers   Expected Discharge Plan: [place] Barriers to Discharge: Continued Medical Work up  Expected Discharge Plan and Services Expected Discharge Plan: [place]   Discharge Planning Services: CM Consult Post Acute Care Choice: [place] Living arrangements for the past 2 months: Single Family Home                                       Social Determinants of Health (SDOH) Interventions    Readmission Risk Interventions No flowsheet data found.

## 2019-02-08 NOTE — TOC Progression Note (Signed)
Transition of Care [facility]) - Progression Note    Patient Details  Name: April [name] MRN: [id number] Date of Birth: 1946/01/27  Transition of Care [facility]) CM/SW [place], RN Phone Number: 02/08/2019, 1:29 PM  Clinical Narrative:    Received  call from [place] number [phone] Nunzio Cory is the case manager at [facility], approved from start date of 5/16 thru 05/20, needs clinical faxed on 05/20 to [facility] to continue services    Expected Discharge Plan: [place] Barriers to Discharge: Continued Medical Work up  Expected Discharge Plan and Services Expected Discharge Plan: [place]   Discharge Planning Services: CM Consult Post Acute Care Choice: [place] arrangements for the past 2 months: Single Family Home                                       Social Determinants of Health (SDOH) Interventions    Readmission Risk Interventions No flowsheet data found.

## 2019-02-09 ENCOUNTER — Inpatient Hospital Stay: Payer: Medicare HMO

## 2019-02-09 LAB — BASIC METABOLIC PANEL
Anion gap: 5 (ref 5–15)
BUN: 30 mg/dL — ABNORMAL HIGH (ref 8–23)
CO2: 25 mmol/L (ref 22–32)
Calcium: 7.9 mg/dL — ABNORMAL LOW (ref 8.9–10.3)
Chloride: 108 mmol/L (ref 98–111)
Creatinine, Ser: 0.9 mg/dL (ref 0.44–1.00)
GFR calc Af Amer: >60 mL/min (ref >60)
GFR calc non Af Amer: >60 mL/min (ref >60)
Glucose, Bld: 173 mg/dL — ABNORMAL HIGH (ref 70–99)
Potassium: 3.9 mmol/L (ref 3.5–5.1)
Sodium: 138 mmol/L (ref 135–145)

## 2019-02-09 LAB — URINE CULTURE: Culture: >=100000 — AB

## 2019-02-09 LAB — CBC
HCT: 34.7 % — ABNORMAL LOW (ref 36.0–46.0)
Hemoglobin: 11.1 g/dL — ABNORMAL LOW (ref 12.0–15.0)
MCH: 29.9 pg (ref 26.0–34.0)
MCHC: 32 g/dL (ref 30.0–36.0)
MCV: 93.5 fL (ref 80.0–100.0)
Platelets: 202 10*3/uL (ref 150–400)
RBC: 3.71 MIL/uL — ABNORMAL LOW (ref 3.87–5.11)
RDW: 14.6 % (ref 11.5–15.5)
WBC: 26.5 10*3/uL — ABNORMAL HIGH (ref 4.0–10.5)
nRBC: 0 % (ref 0.0–0.2)

## 2019-02-09 LAB — GLUCOSE, CAPILLARY
Glucose-Capillary: 212 mg/dL — ABNORMAL HIGH (ref 70–99)
Glucose-Capillary: 166 mg/dL — ABNORMAL HIGH (ref 70–99)
Glucose-Capillary: 183 mg/dL — ABNORMAL HIGH (ref 70–99)
Glucose-Capillary: 203 mg/dL — ABNORMAL HIGH (ref 70–99)

## 2019-02-09 LAB — NOVEL CORONAVIRUS, NAA (HOSP ORDER, SEND-OUT TO REF LAB; TAT 18-24 HRS): SARS-CoV-2, NAA: NOT DETECTED

## 2019-02-09 MED ORDER — IPRATROPIUM-ALBUTEROL 0.5-2.5 (3) MG/3ML IN SOLN
3.0000 mL | Freq: Two times a day (BID) | RESPIRATORY_TRACT | Status: DC
  Administered 2019-02-09 – 2019-02-12 (×6): 3 mL via RESPIRATORY_TRACT
  Filled 2019-02-09 (×6): qty 3

## 2019-02-09 NOTE — Progress Notes (Signed)
Physical Therapy Treatment Patient Details Name: April [name] MRN: [id number] DOB: June 16, 1946 Today's Date: 02/09/2019    History of Present Illness 73 yo Female fell at home and sustained a right hip fracture. She is POD#1 for RLE ORIF. Upon admission she was found to have UTI with elevated glucose levels. Patient is WBAT. PMH: ARF, HTN, NSTEMI, A-fib, Sepsis, Diabetes x2, past partial foot ambulation bilaterally;     PT Comments    Patient is progressing slowly; She is still hesitant to move RLE due to pain and weakness/fatigue. She does not want help to move but is not willing to initiate extremity movement despite heavy cues and encouragement; Patient instructed in LE exercise today to initiate flexibility and strengthening; She required multiple short rest breaks and extensive cues for motivation to improve flexibility; Patient does admit to feeling better after moving despite constant fatigue complaint during activity. At start of session patient was on the phone with her O2 off, SPo2 levels were 85% on room air, instructed patient to wear nasal cannula, 2L O2 support, Spo2 quickly recovered to 94% or greater while on O2 support; Educated patient in importance of O2 support to keep vitals in normal range. Patient verbalized understanding. She would benefit from additional skilled PT intervention to improve strength, balance and gait safety; recommend SNF rehab upon discharge; Pt left in chair with needs in reach and chair alarm on;    Follow Up Recommendations  SNF;Supervision/Assistance - 24 hour     Equipment Recommendations  Rolling walker with 5" wheels    Recommendations for Other Services Rehab consult     Precautions / Restrictions Precautions Precautions: Fall Restrictions Weight Bearing Restrictions: Yes RLE Weight Bearing: Weight bearing as tolerated    Mobility  Bed Mobility Overal bed mobility: Needs Assistance Bed Mobility: Supine to Sit     Supine to sit: Max  assist;+2 for physical assistance;HOB elevated     General bed mobility comments: with bed rail and significant extra time required; requires +2 for sliding forward and LE positioning as well as trunk control; multiple breaks required as patient fearful for movement and requires heavy encouragement for positioning;  x12 min  Transfers Overall transfer level: Needs assistance Equipment used: Rolling walker (2 wheeled) Transfers: Sit to/from Stand Sit to Stand: +2 physical assistance;From elevated surface;Mod assist         General transfer comment: Significant extra time required as patient requires cues for hand placement, body position and encouragement to participate in transfer. Despite repeated counting, encouragement, patient hesitant to transfer due to right hip pain; However, after 2+ attempts, patient able to transfer sit<>Stand with mod A +2 with RW with heavy push through UE onto RW. Required mod VCs to increase forward lean and to increase erect posture upon standing; x12 min  Ambulation/Gait Ambulation/Gait assistance: Mod assist;+2 physical assistance;+2 safety/equipment Gait Distance (Feet): 3 Feet Assistive device: Rolling walker (2 wheeled) Gait Pattern/deviations: Step-to pattern;Decreased step length - right;Decreased step length - left;Decreased dorsiflexion - right;Decreased dorsiflexion - left;Decreased stance time - right;Shuffle     General Gait Details: patient able to take a few steps to bedside chair, extra time required, with mod VCs for foot placement, +2 assist for RW placement/control;    Stairs             Wheelchair Mobility    Modified Rankin (Stroke Patients Only)       Balance Overall balance assessment: Needs assistance Sitting-balance support: Bilateral upper extremity supported;Feet supported Sitting balance-Leahy Scale:  Poor Sitting balance - Comments: exhibits increased posterior lean initially upon sitting, however with prolonged  sitting and use of RW able to lean forward to find neutral position;  Postural control: Posterior lean Standing balance support: Bilateral upper extremity supported;During functional activity Standing balance-Leahy Scale: Poor Standing balance comment: +2 assist with RW for transfer/gait                            Cognition Arousal/Alertness: Awake/alert Behavior During Therapy: WFL for tasks assessed/performed Overall Cognitive Status: Within Functional Limits for tasks assessed                                 General Comments: pt oriented to situation and place; however she does seem to be somewhat confused. Pt at times with conflicting response      Exercises Other Exercises Other Exercises: Instructed patient in LE ROM exercise: supine: AROM on LLE, AAROM on RLE, ankle pump x10, SAQ x10, heel slides x10, hip abduction/adduction to midline x10 with mod VCs to increase ROM to tolerance for better strength and flexibility;  Other Exercises: Patient slow to initiate RLE exercise requiring increased cues for motivation to participate in exercise; Patient also required cues for pursed lip breathing for better SPO2 support; multiple rest breaks required for water as patient has dry mouth    General Comments        Pertinent Vitals/Pain Pain Assessment: Faces Faces Pain Scale: Hurts whole lot Pain Location: no right hip pain at rest; unable to rate pain with movement but reports, "It hurts a whole lot"  Pain Descriptors / Indicators: Burning;Sharp Pain Intervention(s): Limited activity within patient's tolerance;Monitored during session;Repositioned;Patient requesting pain meds-RN notified(RN reports pain has already had pain meds prior to session)    Home Living                      Prior Function            PT Goals (current goals can now be found in the care plan section) Acute Rehab PT Goals Patient Stated Goal: to get better PT Goal  Formulation: With patient Time For Goal Achievement: 02/22/19 Potential to Achieve Goals: Good Progress towards PT goals: Not progressing toward goals - comment(requires heavy cues for motivation, hesitant to move RLE due to pain and fatigue)    Frequency    BID      PT Plan Current plan remains appropriate    Co-evaluation              AM-PAC PT "6 Clicks" Mobility   Outcome Measure  Help needed turning from your back to your side while in a flat bed without using bedrails?: A Lot Help needed moving from lying on your back to sitting on the side of a flat bed without using bedrails?: A Lot Help needed moving to and from a bed to a chair (including a wheelchair)?: A Lot Help needed standing up from a chair using your arms (e.g., wheelchair or bedside chair)?: A Lot Help needed to walk in hospital room?: Total Help needed climbing 3-5 steps with a railing? : Total 6 Click Score: 10    End of Session Equipment Utilized During Treatment: Gait belt Activity Tolerance: Patient limited by fatigue;Patient limited by pain Patient left: in chair;with call bell/phone within reach;with chair alarm set Nurse Communication: Mobility status PT Visit  Diagnosis: Unsteadiness on feet (R26.81);Muscle weakness (generalized) (M62.81);Pain Pain - Right/Left: Right Pain - part of body: Hip     Time: 1537-9432 PT Time Calculation (min) (ACUTE ONLY): 47 min  Charges:  $Therapeutic Exercise: 8-22 mins $Therapeutic Activity: 23-37 mins                        Terriann Difonzo PT, DPT 02/09/2019, 10:59 AM

## 2019-02-09 NOTE — Progress Notes (Signed)
Pt family brought some things from home. Went through to take inventory with the pt. The bag contained a bottle of moisturizing lotion, Deoderant, Two pairs of glasses ( one in purple case other in black case). PT has two sets of house keys, garage door opener, personal documents, pair of round pearl color studs.

## 2019-02-09 NOTE — Progress Notes (Signed)
[employer] at [place] NAME: April [name]    MR#:  [id number]  DATE OF BIRTH:  1946/03/23  Subjective: Patient states her pain is not under control. He is noted to have hypoxia with any movement, he denies any shortness of breath     CHIEF COMPLAINT:   Chief Complaint  Patient presents with  . Fall  . Hip Pain    REVIEW OF SYSTEMS:   ROS CONSTITUTIONAL: No fever, fatigue or weakness.  EYES: No blurred or double vision.  EARS, NOSE, AND THROAT: No tinnitus or ear pain.  RESPIRATORY: Has some cough.  CARDIOVASCULAR: No chest pain, orthopnea, edema.  GASTROINTESTINAL: No nausea, vomiting, diarrhea or abdominal pain.  GENITOURINARY: No dysuria, hematuria.  ENDOCRINE: No polyuria, nocturia,  HEMATOLOGY: No anemia, easy bruising or bleeding SKIN: No rash or lesion. MUSCULOSKELETAL right hip pain  NEUROLOGIC: No tingling, numbness, weakness.  PSYCHIATRY: No anxiety or depression.   DRUG ALLERGIES:   Allergies  Allergen Reactions  . Prednisone Anaphylaxis    VITALS:  Blood pressure (!) 145/79, pulse 89, temperature 98.1 F (36.7 C), temperature source Oral, resp. rate 18, height 5\' 6"  (1.676 m), weight 117.9 kg, SpO2 94 %.  PHYSICAL EXAMINATION:  GENERAL:  73 y.o.-year-old patient lying in the bed with no acute distress.  EYES: Pupils equal, round, reactive to light and accommodation. No scleral icterus. Extraocular muscles intact.  HEENT: Head atraumatic, normocephalic. Oropharynx and nasopharynx clear.  NECK:  Supple, no jugular venous distention. No thyroid enlargement, no tenderness.  LUNGS: Diminished breath sound bilaterally.  Faint expiratory wheeze noted. CARDIOVASCULAR: S1, S2 normal. No murmurs, rubs, or gallops.  ABDOMEN: Soft, nontender, nondistended. Bowel sounds present. No organomegaly or mass.  EXTREMITIES: No pedal edema, cyanosis, or clubbing.  NEUROLOGIC: Cranial nerves II through XII are intact. Muscle  strength 5/5 in all extremities. Sensation intact. Gait not checked.  PSYCHIATRIC: The patient is alert and oriented x 3.  SKIN: No obvious rash, lesion, or ulcer.    LABORATORY PANEL:   CBC Recent Labs  Lab 02/09/19 0415  WBC 26.5*  HGB 11.1*  HCT 34.7*  PLT 202   ------------------------------------------------------------------------------------------------------------------  Chemistries  Recent Labs  Lab 02/05/19 2122  02/06/19 0701  02/09/19 0415  NA 136   < >  --    < > 138  K 3.6   < >  --    < > 3.9  CL 96*   < >  --    < > 108  CO2 24   < >  --    < > 25  GLUCOSE 307*   < >  --    < > 173*  BUN 29*   < >  --    < > 30*  CREATININE 0.98   < >  --    < > 0.90  CALCIUM 9.0   < >  --    < > 7.9*  MG  --   --  2.3  --   --   AST 68*  --   --   --   --   ALT 42  --   --   --   --   ALKPHOS 94  --   --   --   --   BILITOT 1.2  --   --   --   --    < > = values in this interval not displayed.   ------------------------------------------------------------------------------------------------------------------  Cardiac Enzymes Recent Labs  Lab 02/05/19 2122  TROPONINI 0.03*   ------------------------------------------------------------------------------------------------------------------  RADIOLOGY:  Dg Chest Port 1 View  Result Date: 02/08/2019 CLINICAL DATA:  73 year old female with a history of cough EXAM: PORTABLE CHEST 1 VIEW COMPARISON:  02/05/2019 FINDINGS: Cardiomediastinal silhouette unchanged in size and contour. Low lung volumes accentuates the interstitium and the vasculature. No interlobular septal thickening. Linear opacities at the lung bases. No pneumothorax. Coarsened interstitial markings throughout. No confluent airspace disease. IMPRESSION: Low lung volumes with likely basilar atelectasis and a background of chronic changes. Electronically Signed   By: Corrie Mckusick D.O.   On: 02/08/2019 13:14   Dg Femur Port, Min 2 Views Right  Result Date:  02/07/2019 CLINICAL DATA:  Postop intramedullary nail. EXAM: RIGHT FEMUR PORTABLE 2 VIEW COMPARISON:  02/05/2019. FINDINGS: An intramedullary nail with a compression lag screw has been placed across the intertrochanteric fracture. Improved position and alignment. IMPRESSION: No adverse features. Electronically Signed   By: Staci Righter M.D.   On: 02/07/2019 12:52    EKG:   Orders placed or performed during the hospital encounter of 02/05/19  . ED EKG  . ED EKG    ASSESSMENT AND PLAN:  Patient 73 year old with hip fracture  #1  sepsis present on admission secondary to Pseudomonas UTI Continue cefepime Follow WBC count   #2  Hypertensive urgency secondary to right hip pain, controlled.  #3 diabetes mellitus type 2, continue Lantus, sliding scale insulin with coverage adjusted the dose.  #5 right hip fracture status post repair continue physical therapy  #6 ataxia with activity we will check a chest x-ray   #7 history of peripheral artery disease, followed by vascular surgery .  #8 slightly elevated troponin secondary to sepsis.  Due to demand ischemia  #9 cough due to acute bronchitis continue current therapy  More than 50% time spent in counseling, coordination of care.  .  All the records are reviewed and case discussed with Care Management/Social Workerr. Management plans discussed with the patient, family and they are in agreement.  CODE STATUS: Full code  TOTAL TIME TAKING CARE OF THIS PATIENT: 27minutes.   POSSIBLE D/C IN 1-2DAYS, DEPENDING ON CLINICAL CONDITION.   Dustin Flock M.D on 02/09/2019 at 12:30 PM  Between 7am to 6pm - Pager - [phone]  After 6pm go to [email or web address] - password [email or web address] [facility]  Office  [phone]  CC: Primary care physician; Albina Billet, MD   Note: This dictation was prepared with Dragon dictation along with smaller phrase technology. Any transcriptional errors that result from this process are  unintentional.

## 2019-02-09 NOTE — Progress Notes (Signed)
Pt was transferred to the recliner with PT. She is complaining of lower back pain was given tylenol but refuses to take another type of pain medication. Voltaren gel was applied on pt lower back. When placing the gel on her lower back asked pt to assist by sitting up. She refuses to assist with simple task she is capable of doing example sitting up and supporting her self in the chair so we could apply the gel.

## 2019-02-09 NOTE — Progress Notes (Signed)
Physical Therapy Treatment Patient Details Name: April [name] MRN: [id number] DOB: 10/10/1945 Today's Date: 02/09/2019    History of Present Illness 73 yo Female fell at home and sustained a right hip fracture. She is POD#1 for RLE ORIF. Upon admission she was found to have UTI with elevated glucose levels. Patient is WBAT. PMH: ARF, HTN, NSTEMI, A-fib, Sepsis, Diabetes x2, past partial foot ambulation bilaterally;     PT Comments    Patient continues to require +2 assistance for transfers and mobility; She is hesitant to initiate movement with RLE due to pain but does not appreciate therapist moving LE to assist due to fear of pain. Patient continues to require increased time to complete most tasks. She requires heavy encouragement/motivation to assist in mobility and therapy participation. Her SPo2 levels remain in mid 90's while on 2L O2 support, however when on room air she will desat to mid 80's especially with mobility and talking. Patient would benefit from additional skilled PT intervention to improve strength, balance and gait safety; Educated patient on importance of LE movement while in bed to reduce stiffness; Current plan remains appropriate; patient left in bed with needs in reach;     Follow Up Recommendations  SNF;Supervision/Assistance - 24 hour     Equipment Recommendations  Rolling walker with 5" wheels    Recommendations for Other Services Rehab consult     Precautions / Restrictions Precautions Precautions: Fall Restrictions Weight Bearing Restrictions: Yes RLE Weight Bearing: Weight bearing as tolerated    Mobility  Bed Mobility Overal bed mobility: Needs Assistance Bed Mobility: Sit to Supine;Rolling Rolling: Max assist;+2 for physical assistance   Supine to sit: Max assist;+2 for physical assistance;HOB elevated Sit to supine: +2 for physical assistance;Max assist   General bed mobility comments: with bed rail and extra time required; requires +2 for  lifting LE into bed and for controlling trunk position as transitioning to supine; requires max A +2 for rolling, using bed rail; hesitant to initiate rolling to right side due to right hip pain; increased time and verbal cues required for encouragement and motivation;   Transfers Overall transfer level: Needs assistance Equipment used: Rolling walker (2 wheeled) Transfers: Sit to/from [facility] Sit to Stand: +2 physical assistance;+2 safety/equipment;From elevated surface;Mod assist Stand pivot transfers: Mod assist;+2 physical assistance;+2 safety/equipment;From elevated surface       General transfer comment: Significant extra time required as patient requires cues for hand placement, body position and encouragement to participate in transfer. Despite repeated counting, encouragement, patient hesitant to transfer due to right hip pain; However, after 1+ attempts, patient able to transfer sit<>Stand with mod A +2 with RW with heavy push through UE onto RW. Required mod VCs to increase forward lean and to increase eret posture upon standing;   Ambulation/Gait Ambulation/Gait assistance: Mod assist;+2 physical assistance;+2 safety/equipment Gait Distance (Feet): 3 Feet Assistive device: Rolling walker (2 wheeled) Gait Pattern/deviations: Step-to pattern;Decreased step length - right;Decreased step length - left;Decreased dorsiflexion - right;Decreased dorsiflexion - left;Decreased stance time - right;Shuffle     General Gait Details: participated in stand pivot transfer, unable to take as many steps this PM as compared to AM session;    Stairs             Wheelchair Mobility    Modified Rankin (Stroke Patients Only)       Balance Overall balance assessment: Needs assistance Sitting-balance support: Bilateral upper extremity supported;Feet supported Sitting balance-Leahy Scale: Poor Sitting balance - Comments: exhibits increased  posterior lean initially upon  sitting, however with prolonged sitting and use of RW able to lean forward to find neutral position;  Postural control: Posterior lean Standing balance support: Bilateral upper extremity supported;During functional activity Standing balance-Leahy Scale: Poor Standing balance comment: +2 assist with RW for transfer/gait                            Cognition Arousal/Alertness: Awake/alert Behavior During Therapy: WFL for tasks assessed/performed Overall Cognitive Status: Within Functional Limits for tasks assessed                                 General Comments: pt oriented to situation and place; however she does seem to be somewhat confused. Pt at times with conflicting response      Exercises Other Exercises Other Exercises: Instructed patient in LE ROM exercise: supine: AROM on LLE, AAROM on RLE, ankle pump x10, SAQ x10, heel slides x10, hip abduction/adduction to midline x10 with mod VCs to increase ROM to tolerance for better strength and flexibility;  Other Exercises: Patient slow to initiate RLE exercise requiring increased cues for motivation to participate in exercise; Patient also required cues for pursed lip breathing for better SPO2 support; multiple rest breaks required for water as patient has dry mouth    General Comments        Pertinent Vitals/Pain Pain Assessment: Faces Faces Pain Scale: Hurts whole lot Pain Location: no right hip pain at rest; unable to rate pain with movement but reports, "It hurts a whole lot"  Pain Descriptors / Indicators: Burning;Sharp;Shooting Pain Intervention(s): Limited activity within patient's tolerance;Monitored during session;Repositioned    Home Living                      Prior Function            PT Goals (current goals can now be found in the care plan section) Acute Rehab PT Goals Patient Stated Goal: to get better PT Goal Formulation: With patient Time For Goal Achievement:  02/22/19 Potential to Achieve Goals: Good Progress towards PT goals: Not progressing toward goals - comment(requires heavy cues for motivation, hesitant to initiate movement with RLE due to pain; )    Frequency    BID      PT Plan Current plan remains appropriate    Co-evaluation              AM-PAC PT "6 Clicks" Mobility   Outcome Measure  Help needed turning from your back to your side while in a flat bed without using bedrails?: A Lot Help needed moving from lying on your back to sitting on the side of a flat bed without using bedrails?: A Lot Help needed moving to and from a bed to a chair (including a wheelchair)?: A Lot Help needed standing up from a chair using your arms (e.g., wheelchair or bedside chair)?: A Lot Help needed to walk in hospital room?: Total Help needed climbing 3-5 steps with a railing? : Total 6 Click Score: 10    End of Session Equipment Utilized During Treatment: Gait belt Activity Tolerance: Patient limited by fatigue;Patient limited by pain Patient left: with call bell/phone within reach;in bed;with bed alarm set;with nursing/sitter in room Nurse Communication: Mobility status PT Visit Diagnosis: Unsteadiness on feet (R26.81);Muscle weakness (generalized) (M62.81);Pain Pain - Right/Left: Right Pain - part of body:  Hip     Time: 5681-2751 PT Time Calculation (min) (ACUTE ONLY): 11 min  Charges:  $Therapeutic Exercise: 8-22 mins $Therapeutic Activity: 8-22 mins                        Humna Moorehouse PT, DPT 02/09/2019, 12:40 PM

## 2019-02-09 NOTE — Plan of Care (Signed)
  Problem: Education: Goal: Knowledge of General Education information will improve Description Including pain rating scale, medication(s)/side effects and non-pharmacologic comfort measures Outcome: Progressing   Problem: Health Behavior/Discharge Planning: Goal: Ability to manage health-related needs will improve Outcome: Progressing   Problem: Clinical Measurements: Goal: Ability to maintain clinical measurements within normal limits will improve Outcome: Progressing Goal: Will remain free from infection Outcome: Progressing Goal: Diagnostic test results will improve Outcome: Progressing Goal: Respiratory complications will improve Outcome: Progressing Goal: Cardiovascular complication will be avoided Outcome: Progressing   Problem: Activity: Goal: Risk for activity intolerance will decrease Outcome: Progressing   Problem: Nutrition: Goal: Adequate nutrition will be maintained Outcome: Progressing   Problem: Coping: Goal: Level of anxiety will decrease Outcome: Progressing   Problem: Elimination: Goal: Will not experience complications related to bowel motility Outcome: Progressing Goal: Will not experience complications related to urinary retention Outcome: Progressing   Problem: Safety: Goal: Ability to remain free from injury will improve Outcome: Progressing   Problem: Skin Integrity: Goal: Risk for impaired skin integrity will decrease Outcome: Progressing   Problem: Education: Goal: Verbalization of understanding the information provided (i.e., activity precautions, restrictions, etc) will improve Outcome: Progressing Goal: Individualized Educational Video(s) Outcome: Progressing   Problem: Activity: Goal: Ability to ambulate and perform ADLs will improve Outcome: Progressing   Problem: Clinical Measurements: Goal: Postoperative complications will be avoided or minimized Outcome: Progressing   Problem: Self-Concept: Goal: Ability to maintain and  perform role responsibilities to the fullest extent possible will improve Outcome: Progressing   Problem: Pain Management: Goal: Pain level will decrease Outcome: Progressing   Problem: Education: Goal: Knowledge of the prescribed therapeutic regimen will improve Outcome: Progressing Goal: Understanding of discharge needs will improve Outcome: Progressing Goal: Individualized Educational Video(s) Outcome: Progressing   Problem: Activity: Goal: Ability to avoid complications of mobility impairment will improve Outcome: Progressing Goal: Ability to tolerate increased activity will improve Outcome: Progressing   Problem: Clinical Measurements: Goal: Postoperative complications will be avoided or minimized Outcome: Progressing   Problem: Pain Management: Goal: Pain level will decrease with appropriate interventions Outcome: Progressing   Problem: Skin Integrity: Goal: Will show signs of wound healing Outcome: Progressing

## 2019-02-09 NOTE — Progress Notes (Signed)
Subjective:  Patient reports pain as mild.  Working with PT during my visit.  Objective:   VITALS:   Vitals:   02/08/19 2019 02/08/19 2140 02/09/19 0537 02/09/19 0830  BP:  (!) 141/56 (!) 145/79   Pulse:  99 89   Resp:  18 18   Temp:   98.2 F (36.8 C) 98.1 F (36.7 C)  TempSrc:    Oral  SpO2: 96% 95% 96%   Weight:      Height:        PHYSICAL EXAM:  Sensation intact distally Incision: dressing C/D/I Compartment soft  LABS  Results for orders placed or performed during the hospital encounter of 02/05/19 (from the past 24 hour(s))  Glucose, capillary     Status: Abnormal   Collection Time: 02/08/19 11:45 AM  Result Value Ref Range   Glucose-Capillary 216 (H) 70 - 99 mg/dL   Comment 1 Notify RN   Glucose, capillary     Status: Abnormal   Collection Time: 02/08/19  5:03 PM  Result Value Ref Range   Glucose-Capillary 212 (H) 70 - 99 mg/dL   Comment 1 Notify RN   Glucose, capillary     Status: Abnormal   Collection Time: 02/08/19 10:06 PM  Result Value Ref Range   Glucose-Capillary 212 (H) 70 - 99 mg/dL  CBC     Status: Abnormal   Collection Time: 02/09/19  4:15 AM  Result Value Ref Range   WBC 26.5 (H) 4.0 - 10.5 K/uL   RBC 3.71 (L) 3.87 - 5.11 MIL/uL   Hemoglobin 11.1 (L) 12.0 - 15.0 g/dL   HCT 34.7 (L) 36.0 - 46.0 %   MCV 93.5 80.0 - 100.0 fL   MCH 29.9 26.0 - 34.0 pg   MCHC 32.0 30.0 - 36.0 g/dL   RDW 14.6 11.5 - 15.5 %   Platelets 202 150 - 400 K/uL   nRBC 0.0 0.0 - 0.2 %  Basic metabolic panel     Status: Abnormal   Collection Time: 02/09/19  4:15 AM  Result Value Ref Range   Sodium 138 135 - 145 mmol/L   Potassium 3.9 3.5 - 5.1 mmol/L   Chloride 108 98 - 111 mmol/L   CO2 25 22 - 32 mmol/L   Glucose, Bld 173 (H) 70 - 99 mg/dL   BUN 30 (H) 8 - 23 mg/dL   Creatinine, Ser 0.90 0.44 - 1.00 mg/dL   Calcium 7.9 (L) 8.9 - 10.3 mg/dL   GFR calc non Af Amer >60 >60 mL/min   GFR calc Af Amer >60 >60 mL/min   Anion gap 5 5 - 15  Glucose, capillary      Status: Abnormal   Collection Time: 02/09/19  7:51 AM  Result Value Ref Range   Glucose-Capillary 166 (H) 70 - 99 mg/dL   Comment 1 Notify RN     Dg Chest Port 1 View  Result Date: 02/08/2019 CLINICAL DATA:  73 year old female with a history of cough EXAM: PORTABLE CHEST 1 VIEW COMPARISON:  02/05/2019 FINDINGS: Cardiomediastinal silhouette unchanged in size and contour. Low lung volumes accentuates the interstitium and the vasculature. No interlobular septal thickening. Linear opacities at the lung bases. No pneumothorax. Coarsened interstitial markings throughout. No confluent airspace disease. IMPRESSION: Low lung volumes with likely basilar atelectasis and a background of chronic changes. Electronically Signed   By: Corrie Mckusick D.O.   On: 02/08/2019 13:14   Dg Hip Operative Unilat W Or W/o Pelvis Right  Result Date: 02/07/2019  CLINICAL DATA:  Intramedullary nail placement for proximal femur fracture EXAM: OPERATIVE RIGHT HIP/femur: 2  VIEWS TECHNIQUE: Fluoroscopic spot image(s) were submitted for interpretation post-operatively. FLUOROSCOPY TIME:  1 MINUTES 37 SECONDS; 4 ACQUIRED IMAGES COMPARISON:  Pelvis and right hip radiographs Feb 05, 2019 FINDINGS: Frontal and lateral views were obtained. There is screw and rod fixation through a comminuted fracture of the intertrochanteric and proximal subtrochanteric regions of the right proximal femur. Alignment at the fracture site is near anatomic. Tip of the screw is in the proximal femoral head. The rod more distally is fixated 2 surgical screws. No dislocation. IMPRESSION: Surgical fixation through comminuted proximal femur fracture with alignment at the fracture site near anatomic. Tip of screw in proximal femoral head. No dislocation. Electronically Signed   By: Lowella Grip III M.D.   On: 02/07/2019 11:42   Dg Femur Port, Min 2 Views Right  Result Date: 02/07/2019 CLINICAL DATA:  Postop intramedullary nail. EXAM: RIGHT FEMUR PORTABLE 2  VIEW COMPARISON:  02/05/2019. FINDINGS: An intramedullary nail with a compression lag screw has been placed across the intertrochanteric fracture. Improved position and alignment. IMPRESSION: No adverse features. Electronically Signed   By: Staci Righter M.D.   On: 02/07/2019 12:52    Assessment/Plan: 2 Days Post-Op   Active Problems:   S/P right hip fracture   Up with therapy weight bearing as tolerated  Patient is on Lovenox for DVT prophylaxis.  She should be discharged on 4 weeks of Lovenox.   She will follow-up with Dr. Mack Guise in the office in approximately 10-14 days following discharge.   Lovell Sheehan , MD 02/09/2019, 10:13 AM

## 2019-02-09 NOTE — Progress Notes (Signed)
Rounded on pt to reassess pain and pt was sleeping comfortably

## 2019-02-10 LAB — CBC
HCT: 35.7 % — ABNORMAL LOW (ref 36.0–46.0)
Hemoglobin: 11.5 g/dL — ABNORMAL LOW (ref 12.0–15.0)
MCH: 30.1 pg (ref 26.0–34.0)
MCHC: 32.2 g/dL (ref 30.0–36.0)
MCV: 93.5 fL (ref 80.0–100.0)
Platelets: 243 10*3/uL (ref 150–400)
RBC: 3.82 MIL/uL — ABNORMAL LOW (ref 3.87–5.11)
RDW: 14.6 % (ref 11.5–15.5)
WBC: 27.4 10*3/uL — ABNORMAL HIGH (ref 4.0–10.5)
nRBC: 0.1 % (ref 0.0–0.2)

## 2019-02-10 LAB — BASIC METABOLIC PANEL
Anion gap: 6 (ref 5–15)
BUN: 23 mg/dL (ref 8–23)
CO2: 27 mmol/L (ref 22–32)
Calcium: 8.1 mg/dL — ABNORMAL LOW (ref 8.9–10.3)
Chloride: 107 mmol/L (ref 98–111)
Creatinine, Ser: 0.74 mg/dL (ref 0.44–1.00)
GFR calc Af Amer: >60 mL/min (ref >60)
GFR calc non Af Amer: >60 mL/min (ref >60)
Glucose, Bld: 153 mg/dL — ABNORMAL HIGH (ref 70–99)
Potassium: 3.9 mmol/L (ref 3.5–5.1)
Sodium: 140 mmol/L (ref 135–145)

## 2019-02-10 LAB — CULTURE, BLOOD (ROUTINE X 2)
Culture: NO GROWTH
Culture: NO GROWTH
Special Requests: ADEQUATE

## 2019-02-10 LAB — GLUCOSE, CAPILLARY
Glucose-Capillary: 219 mg/dL — ABNORMAL HIGH (ref 70–99)
Glucose-Capillary: 213 mg/dL — ABNORMAL HIGH (ref 70–99)
Glucose-Capillary: 152 mg/dL — ABNORMAL HIGH (ref 70–99)

## 2019-02-10 NOTE — Progress Notes (Signed)
Subjective:  Pain is well controlled.  Objective:   VITALS:   Vitals:   02/09/19 1953 02/09/19 2119 02/10/19 0623 02/10/19 0735  BP:  (!) 152/58 136/60 (!) 156/55  Pulse:  97 97 93  Resp:  18 18 18   Temp:  98.1 F (36.7 C) 98.5 F (36.9 C) 98.6 F (37 C)  TempSrc:  Oral Oral Oral  SpO2: 99% 91% 95% 91%  Weight:      Height:        PHYSICAL EXAM:  Telemedicine visit. Dressing is c/d/i and neurovascular intact per nursing  LABS  Results for orders placed or performed during the hospital encounter of 02/05/19 (from the past 24 hour(s))  Glucose, capillary     Status: Abnormal   Collection Time: 02/09/19 11:42 AM  Result Value Ref Range   Glucose-Capillary 183 (H) 70 - 99 mg/dL   Comment 1 Notify RN   Glucose, capillary     Status: Abnormal   Collection Time: 02/09/19  4:36 PM  Result Value Ref Range   Glucose-Capillary 203 (H) 70 - 99 mg/dL   Comment 1 Notify RN   CBC     Status: Abnormal   Collection Time: 02/10/19  5:20 AM  Result Value Ref Range   WBC 27.4 (H) 4.0 - 10.5 K/uL   RBC 3.82 (L) 3.87 - 5.11 MIL/uL   Hemoglobin 11.5 (L) 12.0 - 15.0 g/dL   HCT 35.7 (L) 36.0 - 46.0 %   MCV 93.5 80.0 - 100.0 fL   MCH 30.1 26.0 - 34.0 pg   MCHC 32.2 30.0 - 36.0 g/dL   RDW 14.6 11.5 - 15.5 %   Platelets 243 150 - 400 K/uL   nRBC 0.1 0.0 - 0.2 %  Basic metabolic panel     Status: Abnormal   Collection Time: 02/10/19  5:20 AM  Result Value Ref Range   Sodium 140 135 - 145 mmol/L   Potassium 3.9 3.5 - 5.1 mmol/L   Chloride 107 98 - 111 mmol/L   CO2 27 22 - 32 mmol/L   Glucose, Bld 153 (H) 70 - 99 mg/dL   BUN 23 8 - 23 mg/dL   Creatinine, Ser 0.74 0.44 - 1.00 mg/dL   Calcium 8.1 (L) 8.9 - 10.3 mg/dL   GFR calc non Af Amer >60 >60 mL/min   GFR calc Af Amer >60 >60 mL/min   Anion gap 6 5 - 15  Glucose, capillary     Status: Abnormal   Collection Time: 02/10/19  7:37 AM  Result Value Ref Range   Glucose-Capillary 219 (H) 70 - 99 mg/dL    Dg Chest Port 1  View  Result Date: 02/09/2019 CLINICAL DATA:  Shortness of breath, recent surgery EXAM: PORTABLE CHEST 1 VIEW COMPARISON:  02/08/2019 FINDINGS: No change in low volume AP portable examination with probable bibasilar scarring and/or atelectasis. No new or focal airspace opacity. IMPRESSION: No change in low volume AP portable examination with probable bibasilar scarring and/or atelectasis. No new or focal airspace opacity. Electronically Signed   By: Eddie Candle M.D.   On: 02/09/2019 15:28   Dg Chest Port 1 View  Result Date: 02/08/2019 CLINICAL DATA:  73 year old female with a history of cough EXAM: PORTABLE CHEST 1 VIEW COMPARISON:  02/05/2019 FINDINGS: Cardiomediastinal silhouette unchanged in size and contour. Low lung volumes accentuates the interstitium and the vasculature. No interlobular septal thickening. Linear opacities at the lung bases. No pneumothorax. Coarsened interstitial markings throughout. No confluent airspace disease. IMPRESSION:  Low lung volumes with likely basilar atelectasis and a background of chronic changes. Electronically Signed   By: Corrie Mckusick D.O.   On: 02/08/2019 13:14    Assessment/Plan: 3 Days Post-Op   Active Problems:   S/P right hip fracture   Up with therapy WBAT on right Discharge to SNF when medically stable Dr. Mack Guise to resume care on Monday. Please call with questions.    Lovell Sheehan , MD 02/10/2019, 10:29 AM

## 2019-02-10 NOTE — Progress Notes (Signed)
[facility] [place] NAME: April [name]    MR#:  [id number]  DATE OF BIRTH:  1946-02-03  Subjective: Patient states that she is very tired and fatigue She denies any chest pain    CHIEF COMPLAINT:   Chief Complaint  Patient presents with  . Fall  . Hip Pain    REVIEW OF SYSTEMS:   ROS CONSTITUTIONAL: No fever, fatigue or weakness.  EYES: No blurred or double vision.  EARS, NOSE, AND THROAT: No tinnitus or ear pain.  RESPIRATORY: Has some cough.   CARDIOVASCULAR: No chest pain, orthopnea, edema.  GASTROINTESTINAL: No nausea, vomiting, diarrhea or abdominal pain.  GENITOURINARY: No dysuria, hematuria.  ENDOCRINE: No polyuria, nocturia,  HEMATOLOGY: No anemia, easy bruising or bleeding SKIN: No rash or lesion. MUSCULOSKELETAL right hip pain  NEUROLOGIC: No tingling, numbness, weakness.  PSYCHIATRY: No anxiety or depression.   DRUG ALLERGIES:   Allergies  Allergen Reactions  . Prednisone Anaphylaxis    VITALS:  Blood pressure (!) 156/55, pulse 93, temperature 98.6 F (37 C), temperature source Oral, resp. rate 18, height 5\' 6"  (1.676 m), weight 117.9 kg, SpO2 91 %.  PHYSICAL EXAMINATION:  GENERAL:  73 y.o.-year-old patient lying in the bed with no acute distress.  EYES: Pupils equal, round, reactive to light and accommodation. No scleral icterus. Extraocular muscles intact.  HEENT: Head atraumatic, normocephalic. Oropharynx and nasopharynx clear.  NECK:  Supple, no jugular venous distention. No thyroid enlargement, no tenderness.  LUNGS: Diminished breath sound bilaterally.  Faint expiratory wheeze noted. CARDIOVASCULAR: S1, S2 normal. No murmurs, rubs, or gallops.  ABDOMEN: Soft, nontender, nondistended. Bowel sounds present. No organomegaly or mass.  EXTREMITIES: No pedal edema, cyanosis, or clubbing.  NEUROLOGIC: Cranial nerves II through XII are intact. Muscle strength 5/5 in all extremities. Sensation intact.  Gait not checked.  PSYCHIATRIC: The patient is alert and oriented x 3.  SKIN: No obvious rash, lesion, or ulcer.    LABORATORY PANEL:   CBC Recent Labs  Lab 02/10/19 0520  WBC 27.4*  HGB 11.5*  HCT 35.7*  PLT 243   ------------------------------------------------------------------------------------------------------------------  Chemistries  Recent Labs  Lab 02/05/19 2122  02/06/19 0701  02/10/19 0520  NA 136   < >  --    < > 140  K 3.6   < >  --    < > 3.9  CL 96*   < >  --    < > 107  CO2 24   < >  --    < > 27  GLUCOSE 307*   < >  --    < > 153*  BUN 29*   < >  --    < > 23  CREATININE 0.98   < >  --    < > 0.74  CALCIUM 9.0   < >  --    < > 8.1*  MG  --   --  2.3  --   --   AST 68*  --   --   --   --   ALT 42  --   --   --   --   ALKPHOS 94  --   --   --   --   BILITOT 1.2  --   --   --   --    < > = values in this interval not displayed.   ------------------------------------------------------------------------------------------------------------------  Cardiac Enzymes Recent Labs  Lab 02/05/19 2122  TROPONINI 0.03*   ------------------------------------------------------------------------------------------------------------------  RADIOLOGY:  Dg Chest Port 1 View  Result Date: 02/09/2019 CLINICAL DATA:  Shortness of breath, recent surgery EXAM: PORTABLE CHEST 1 VIEW COMPARISON:  02/08/2019 FINDINGS: No change in low volume AP portable examination with probable bibasilar scarring and/or atelectasis. No new or focal airspace opacity. IMPRESSION: No change in low volume AP portable examination with probable bibasilar scarring and/or atelectasis. No new or focal airspace opacity. Electronically Signed   By: Eddie Candle M.D.   On: 02/09/2019 15:28   Dg Chest Port 1 View  Result Date: 02/08/2019 CLINICAL DATA:  73 year old female with a history of cough EXAM: PORTABLE CHEST 1 VIEW COMPARISON:  02/05/2019 FINDINGS: Cardiomediastinal silhouette unchanged in size  and contour. Low lung volumes accentuates the interstitium and the vasculature. No interlobular septal thickening. Linear opacities at the lung bases. No pneumothorax. Coarsened interstitial markings throughout. No confluent airspace disease. IMPRESSION: Low lung volumes with likely basilar atelectasis and a background of chronic changes. Electronically Signed   By: Corrie Mckusick D.O.   On: 02/08/2019 13:14    EKG:   Orders placed or performed during the hospital encounter of 02/05/19  . ED EKG  . ED EKG    ASSESSMENT AND PLAN:  Patient 73 year old with hip fracture  #1  sepsis present on admission secondary to Pseudomonas UTI Continue cefepime WBC count continues to be elevated, unclear etiology patient is afebrile I will check his flow cytometry on the cell count   #2  Hypertensive urgency secondary to right hip pain, controlled.  #3 diabetes mellitus type 2, continue Lantus, sliding scale insulin with coverage adjusted the dose.  #5 right hip fracture status post repair continue physical therapy  #6 hypoxia cxr negative   #7 history of peripheral artery disease, followed by vascular surgery .  #8 slightly elevated troponin secondary to sepsis.  Due to demand ischemia  #9 cough due to acute bronchitis continue current therapy  More than 50% time spent in counseling, coordination of care.  .  All the records are reviewed and case discussed with Care Management/Social Workerr. Management plans discussed with the patient, family and they are in agreement.  CODE STATUS: Full code  TOTAL TIME TAKING CARE OF THIS PATIENT: 48minutes.   POSSIBLE D/C IN 1-2DAYS, DEPENDING ON CLINICAL CONDITION.   Dustin Flock M.D on 02/10/2019 at 12:14 PM  Between 7am to 6pm - Pager - [phone]  After 6pm go to [email or web address] - password [email or web address] [facility]  Office  [phone]  CC: Primary care physician; Albina Billet, MD   Note: This dictation was prepared  with Dragon dictation along with smaller phrase technology. Any transcriptional errors that result from this process are unintentional.

## 2019-02-10 NOTE — Progress Notes (Signed)
Physical Therapy Treatment Patient Details Name: April [name] MRN: [id number] DOB: 12-Dec-1945 Today's Date: 02/10/2019    History of Present Illness 73 yo Female fell at home and sustained a right hip fracture. She is POD#1 for RLE ORIF. Upon admission she was found to have UTI with elevated glucose levels. Patient is WBAT. PMH: ARF, HTN, NSTEMI, A-fib, Sepsis, Diabetes x2, past partial foot ambulation bilaterally;     PT Comments    Patient agreeable to PT with encouragement, reported that she is very tired today and wanted to rest. PT and pt able to complete bed level therapeutic exercises, mostly AROM except AAROM for hip abduction. MD entered room to speak with pt, and also provided pt with encouragement for mobilize. Pt performed supine <> sit with maxAx2, repositioned in bed maxAx2, and performed rolling modA with heavy use of bed rails. Further mobility deferred, pt declining due to increased pain/fatigue this session, pt educated about importance of mobility, weight bearing, and frequent changing of position in hospital. Pt in bed with all needs in reach. The patient would benefit from further skilled PT to continue to maximize mobility, independence, and return to PLOF.    Follow Up Recommendations  SNF;Supervision/Assistance - 24 hour     Equipment Recommendations  Rolling walker with 5" wheels    Recommendations for Other Services Rehab consult     Precautions / Restrictions Precautions Precautions: Fall Restrictions Weight Bearing Restrictions: Yes RLE Weight Bearing: Weight bearing as tolerated    Mobility  Bed Mobility Overal bed mobility: Needs Assistance Bed Mobility: Sit to Supine;Rolling Rolling: Mod assist;+2 for physical assistance   Supine to sit: Max assist;+2 for physical assistance;HOB elevated Sit to supine: +2 for physical assistance;Max assist   General bed mobility comments: with bed rail and extra time required; requires +2 for lifting LE into bed  and for controlling trunk position as transitioning to supine; requires max A +2 for rolling, using bed rail; hesitant to initiate rolling to right side due to right hip pain; increased time and verbal cues required for encouragement and motivation;   Transfers                 General transfer comment: deferred, pt declining further mobility due to increased pain/fatigue this session, pt educated about importance of mobility, weight bearing, and frequent changing of position in hospital.   Ambulation/Gait             General Gait Details: deferred   Stairs             Wheelchair Mobility    Modified Rankin (Stroke Patients Only)       Balance Overall balance assessment: Needs assistance Sitting-balance support: Bilateral upper extremity supported;Feet supported Sitting balance-Leahy Scale: Poor Sitting balance - Comments: use of bilateral UE for support, progressed from modA-CGA. Postural control: Posterior lean                                  Cognition Arousal/Alertness: Awake/alert Behavior During Therapy: Restless;Anxious Overall Cognitive Status: Within Functional Limits for tasks assessed                                 General Comments: pt oriented to situation and place; however she does seem to be somewhat confused. Pt at times with conflicting response      Exercises General Exercises -  Lower Extremity Ankle Circles/Pumps: AROM;15 reps;Both Quad Sets: AROM;15 reps;Right;Strengthening Short Arc Quad: AROM;15 reps;Right;Strengthening;AAROM Heel Slides: AAROM;15 reps;Right Hip ABduction/ADduction: AAROM;15 reps;Right    General Comments        Pertinent Vitals/Pain Pain Assessment: Faces Faces Pain Scale: Hurts little more Pain Location: No pain at rest, yells/moans with supine <> sit Pain Descriptors / Indicators: Crying;Moaning;Grimacing;Guarding Pain Intervention(s): Limited activity within patient's  tolerance;Monitored during session;Repositioned    Home Living                      Prior Function            PT Goals (current goals can now be found in the care plan section) Acute Rehab PT Goals Patient Stated Goal: to get better Progress towards PT goals: Progressing toward goals    Frequency    BID      PT Plan Current plan remains appropriate    Co-evaluation              AM-PAC PT "6 Clicks" Mobility   Outcome Measure  Help needed turning from your back to your side while in a flat bed without using bedrails?: A Lot Help needed moving from lying on your back to sitting on the side of a flat bed without using bedrails?: A Lot Help needed moving to and from a bed to a chair (including a wheelchair)?: A Lot Help needed standing up from a chair using your arms (e.g., wheelchair or bedside chair)?: A Lot Help needed to walk in hospital room?: Total Help needed climbing 3-5 steps with a railing? : Total 6 Click Score: 10    End of Session Equipment Utilized During Treatment: Gait belt Activity Tolerance: Patient limited by fatigue;Patient limited by pain Patient left: with call bell/phone within reach;in bed;with bed alarm set Nurse Communication: Mobility status PT Visit Diagnosis: Unsteadiness on feet (R26.81);Muscle weakness (generalized) (M62.81);Pain Pain - Right/Left: Right Pain - part of body: Hip     Time: 4097-3532 PT Time Calculation (min) (ACUTE ONLY): 51 min  Charges:  $Therapeutic Exercise: 8-22 mins $Therapeutic Activity: 23-37 mins                     Lieutenant Diego PT, DPT 11:55 AM,02/10/19 [phone]

## 2019-02-10 NOTE — Progress Notes (Signed)
PT Cancellation Note  Patient Details Name: April [name] MRN: [id number] DOB: 12/08/1945   Cancelled Treatment:    Reason Eval/Treat Not Completed: Other (comment)(Pt refused PT at this time, stating she "was about to pop", and that she wanted to have her roomed cleared first. Pt educated about importance of mobility, and informed PT will be back later this AM. Pt placed on 2L, RN informed pt in mid 76s on room air)  Lieutenant Diego PT, DPT 9:11 AM,02/10/19 [phone]

## 2019-02-10 NOTE — Progress Notes (Signed)
OT Cancellation Note  Patient Details Name: April [name] MRN: [id number] DOB: 1946-07-30   Cancelled Treatment:    Reason Eval/Treat Not Completed: Other (comment). Upon arrival to floor to see pt for OT tx, RN informed this OT the pt was just now getting pain medicine and endorsed being in pain/freezing. Will hold and re-attempt at a later time/date as available and pt medically appropriate for OT tx.   Shara Blazing, M.S., OTR/L Ascom: [phone] 02/10/19, 1:16 PM

## 2019-02-11 ENCOUNTER — Inpatient Hospital Stay: Payer: Medicare HMO

## 2019-02-11 DIAGNOSIS — W19XXXD Unspecified fall, subsequent encounter: Secondary | ICD-10-CM

## 2019-02-11 DIAGNOSIS — R8271 Bacteriuria: Secondary | ICD-10-CM

## 2019-02-11 DIAGNOSIS — Z888 Allergy status to other drugs, medicaments and biological substances status: Secondary | ICD-10-CM

## 2019-02-11 DIAGNOSIS — Z89422 Acquired absence of other left toe(s): Secondary | ICD-10-CM

## 2019-02-11 DIAGNOSIS — N390 Urinary tract infection, site not specified: Secondary | ICD-10-CM

## 2019-02-11 DIAGNOSIS — B965 Pseudomonas (aeruginosa) (mallei) (pseudomallei) as the cause of diseases classified elsewhere: Secondary | ICD-10-CM

## 2019-02-11 DIAGNOSIS — Z7901 Long term (current) use of anticoagulants: Secondary | ICD-10-CM

## 2019-02-11 DIAGNOSIS — S72001D Fracture of unspecified part of neck of right femur, subsequent encounter for closed fracture with routine healing: Secondary | ICD-10-CM

## 2019-02-11 DIAGNOSIS — Z794 Long term (current) use of insulin: Secondary | ICD-10-CM

## 2019-02-11 DIAGNOSIS — D696 Thrombocytopenia, unspecified: Secondary | ICD-10-CM

## 2019-02-11 DIAGNOSIS — L299 Pruritus, unspecified: Secondary | ICD-10-CM

## 2019-02-11 DIAGNOSIS — D72829 Elevated white blood cell count, unspecified: Secondary | ICD-10-CM

## 2019-02-11 DIAGNOSIS — I480 Paroxysmal atrial fibrillation: Secondary | ICD-10-CM

## 2019-02-11 DIAGNOSIS — Z87442 Personal history of urinary calculi: Secondary | ICD-10-CM

## 2019-02-11 DIAGNOSIS — Z89421 Acquired absence of other right toe(s): Secondary | ICD-10-CM

## 2019-02-11 DIAGNOSIS — Z872 Personal history of diseases of the skin and subcutaneous tissue: Secondary | ICD-10-CM

## 2019-02-11 DIAGNOSIS — Z8619 Personal history of other infectious and parasitic diseases: Secondary | ICD-10-CM

## 2019-02-11 DIAGNOSIS — E1151 Type 2 diabetes mellitus with diabetic peripheral angiopathy without gangrene: Secondary | ICD-10-CM

## 2019-02-11 LAB — CBC WITH DIFFERENTIAL/PLATELET
Abs Immature Granulocytes: 6.95 10*3/uL — ABNORMAL HIGH (ref 0.00–0.07)
Basophils Absolute: 0.1 10*3/uL (ref 0.0–0.1)
Basophils Relative: 0 %
Eosinophils Absolute: 1.3 10*3/uL — ABNORMAL HIGH (ref 0.0–0.5)
Eosinophils Relative: 4 %
HCT: 37.3 % (ref 36.0–46.0)
Hemoglobin: 11.9 g/dL — ABNORMAL LOW (ref 12.0–15.0)
Immature Granulocytes: 21 %
Lymphocytes Relative: 11 %
Lymphs Abs: 3.9 10*3/uL (ref 0.7–4.0)
MCH: 29.9 pg (ref 26.0–34.0)
MCHC: 31.9 g/dL (ref 30.0–36.0)
MCV: 93.7 fL (ref 80.0–100.0)
Monocytes Absolute: 2 10*3/uL — ABNORMAL HIGH (ref 0.1–1.0)
Monocytes Relative: 6 %
Neutro Abs: 19.9 10*3/uL — ABNORMAL HIGH (ref 1.7–7.7)
Neutrophils Relative %: 58 %
Platelets: 314 10*3/uL (ref 150–400)
RBC: 3.98 MIL/uL (ref 3.87–5.11)
RDW: 14.5 % (ref 11.5–15.5)
Smear Review: NORMAL
WBC: 34 10*3/uL — ABNORMAL HIGH (ref 4.0–10.5)
nRBC: 0.2 % (ref 0.0–0.2)

## 2019-02-11 LAB — GLUCOSE, CAPILLARY
Glucose-Capillary: 166 mg/dL — ABNORMAL HIGH (ref 70–99)
Glucose-Capillary: 204 mg/dL — ABNORMAL HIGH (ref 70–99)
Glucose-Capillary: 139 mg/dL — ABNORMAL HIGH (ref 70–99)
Glucose-Capillary: 152 mg/dL — ABNORMAL HIGH (ref 70–99)

## 2019-02-11 LAB — BASIC METABOLIC PANEL
Anion gap: 9 (ref 5–15)
BUN: 16 mg/dL (ref 8–23)
CO2: 28 mmol/L (ref 22–32)
Calcium: 8.2 mg/dL — ABNORMAL LOW (ref 8.9–10.3)
Chloride: 101 mmol/L (ref 98–111)
Creatinine, Ser: 0.72 mg/dL (ref 0.44–1.00)
GFR calc Af Amer: >60 mL/min (ref >60)
GFR calc non Af Amer: >60 mL/min (ref >60)
Glucose, Bld: 196 mg/dL — ABNORMAL HIGH (ref 70–99)
Potassium: 3.7 mmol/L (ref 3.5–5.1)
Sodium: 138 mmol/L (ref 135–145)

## 2019-02-11 LAB — PATHOLOGIST SMEAR REVIEW

## 2019-02-11 MED ORDER — POLYETHYLENE GLYCOL 3350 17 G PO PACK
17.0000 g | PACK | Freq: Once | ORAL | Status: AC
  Administered 2019-02-11: 18:00:00 17 g via ORAL
  Filled 2019-02-11: qty 1

## 2019-02-11 NOTE — Progress Notes (Addendum)
Physical Therapy Treatment Patient Details Name: April [name] MRN: [id number] DOB: November 14, 1945 Today's Date: 02/11/2019    History of Present Illness 73 yo Female fell at home and sustained a right hip fracture. She is POD#1 for RLE ORIF. Upon admission she was found to have UTI with elevated glucose levels. Patient is WBAT. PMH: ARF, HTN, NSTEMI, A-fib, Sepsis, Diabetes x2, past partial foot ambulation bilaterally;     PT Comments    Pt reported fatigue when PT entered room, prior to session PT spoke with RN/CNA, pt just recently returned to bed (about 15 mins prior to session). Pt agreeable to exercises with encouragement. Pt did demonstrated improved muscle activation as well as decreased pain with exercises, still requires verbal/tactile cues to perform, intermittent cues to keep eyes open. Pt in bed with all needs in reach.      Follow Up Recommendations  SNF;Supervision/Assistance - 24 hour     Equipment Recommendations  Rolling walker with 5" wheels    Recommendations for Other Services Rehab consult     Precautions / Restrictions Precautions Precautions: Fall Restrictions Weight Bearing Restrictions: Yes RLE Weight Bearing: Weight bearing as tolerated    Mobility  Bed Mobility Overal bed mobility: Needs Assistance Bed Mobility: Supine to Sit Rolling: Mod assist;+2 for physical assistance   Supine to sit: Min assist Sit to supine: +2 for physical assistance;Max assist   General bed mobility comments: deferred, pt reported fatigue and just returned to bed  Transfers Overall transfer level: Needs assistance Equipment used: Rolling walker (2 wheeled) Transfers: Sit to/from [facility] Sit to Stand: Min assist;+2 safety/equipment Stand pivot transfers: Min assist;+2 safety/equipment       General transfer comment: Constant motivation and cueing needed for patient to attempt transfer. Heavily stabilizing RW by PT/OT and minA to maintain initial  standing balance with RW.  Ambulation/Gait Ambulation/Gait assistance: Mod assist;+2 physical assistance;+2 safety/equipment Gait Distance (Feet): 3 Feet Assistive device: Rolling walker (2 wheeled)       General Gait Details: Pt able to take very small, shuffled steps towards chair with constant verbal cueing. Fatigued very quickly, wanted to sit prior to full turn towards chair, modAx2 for descent to chair.   Stairs             Wheelchair Mobility    Modified Rankin (Stroke Patients Only)       Balance Overall balance assessment: Needs assistance Sitting-balance support: Bilateral upper extremity supported;Feet supported Sitting balance-Leahy Scale: Poor Sitting balance - Comments: use of bilateral UE for support, progressed from modA-CGA. Pt most comfortable with at least 1 UE support Postural control: Posterior lean Standing balance support: Bilateral upper extremity supported;During functional activity Standing balance-Leahy Scale: Poor Standing balance comment: +2 assist with RW for transfer/gait                            Cognition Arousal/Alertness: Awake/alert Behavior During Therapy: Anxious Overall Cognitive Status: Within Functional Limits for tasks assessed                                 General Comments: Pt oriented to situation, place, self, pt anxious and emotional during session, intermittent redirecting to task needed      Exercises General Exercises - Lower Extremity Ankle Circles/Pumps: AROM;15 reps;Both Quad Sets: WUJW;11 reps;Right;Strengthening Gluteal Sets: AROM;Both;15 reps Short Arc Quad: AROM;15 reps;Right;Strengthening;10 reps Heel Slides: AAROM;15 reps;Right  Hip ABduction/ADduction: AAROM;15 reps;Right Other Exercises     General Comments        Pertinent Vitals/Pain Pain Assessment: Faces Faces Pain Scale: Hurts a little bit Pain Location: with bed level exercises Pain Descriptors / Indicators:  Crying;Moaning;Grimacing;Guarding Pain Intervention(s): Limited activity within patient's tolerance;Monitored during session;Repositioned    Home Living                      Prior Function            PT Goals (current goals can now be found in the care plan section) Acute Rehab PT Goals Patient Stated Goal: to get better Progress towards PT goals: Progressing toward goals    Frequency    BID      PT Plan Current plan remains appropriate    Co-evaluation   Reason for Co-Treatment: Complexity of the patient's impairments (multi-system involvement);Necessary to address cognition/behavior during functional activity;For patient/therapist safety;To address functional/ADL transfers PT goals addressed during session: Mobility/safety with mobility;Balance;Strengthening/ROM OT goals addressed during session: ADL's and self-care;Proper use of Adaptive equipment and DME;Strengthening/ROM      AM-PAC PT "6 Clicks" Mobility   Outcome Measure  Help needed turning from your back to your side while in a flat bed without using bedrails?: A Lot Help needed moving from lying on your back to sitting on the side of a flat bed without using bedrails?: A Lot Help needed moving to and from a bed to a chair (including a wheelchair)?: A Lot Help needed standing up from a chair using your arms (e.g., wheelchair or bedside chair)?: A Lot Help needed to walk in hospital room?: A Lot Help needed climbing 3-5 steps with a railing? : Total 6 Click Score: 11    End of Session Equipment Utilized During Treatment: Oxygen Activity Tolerance: Patient limited by fatigue Patient left: with call bell/phone within reach;in bed;with SCD's reapplied;with bed alarm set Nurse Communication: Mobility status PT Visit Diagnosis: Unsteadiness on feet (R26.81);Muscle weakness (generalized) (M62.81);Pain Pain - Right/Left: Right Pain - part of body: Hip     Time: 2025-4270 PT Time Calculation (min)  (ACUTE ONLY): 17 min  Charges:  $Therapeutic Exercise: 8-22 mins $Therapeutic Activity: 8-22 mins                     Lieutenant Diego PT, DPT 2:09 PM,02/11/19 [phone]

## 2019-02-11 NOTE — Care Management Important Message (Signed)
Important Message  Patient Details  Name: April [name] MRN: [id number] Date of Birth: 1945-12-27   Medicare Important Message Given:  Yes    Su Hilt, RN 02/11/2019, 10:55 AM

## 2019-02-11 NOTE — Progress Notes (Signed)
[facility] [place] NAME: April [name]    MR#:  [id number]  DATE OF BIRTH:  June 24, 1946  Subjective: Patient feeling little better today WBC count continues to be elevated  CHIEF COMPLAINT:   Chief Complaint  Patient presents with  . Fall  . Hip Pain    REVIEW OF SYSTEMS:   ROS CONSTITUTIONAL: No fever, fatigue or weakness.  EYES: No blurred or double vision.  EARS, NOSE, AND THROAT: No tinnitus or ear pain.  RESPIRATORY: Has some cough.   CARDIOVASCULAR: No chest pain, orthopnea, edema.  GASTROINTESTINAL: No nausea, vomiting, diarrhea or abdominal pain.  GENITOURINARY: No dysuria, hematuria.  ENDOCRINE: No polyuria, nocturia,  HEMATOLOGY: No anemia, easy bruising or bleeding SKIN: No rash or lesion. MUSCULOSKELETAL right hip pain  NEUROLOGIC: No tingling, numbness, weakness.  PSYCHIATRY: No anxiety or depression.   DRUG ALLERGIES:   Allergies  Allergen Reactions  . Prednisone Anaphylaxis    VITALS:  Blood pressure (!) 166/70, pulse 98, temperature 98.3 F (36.8 C), resp. rate 20, height 5\' 6"  (1.676 m), weight 117.9 kg, SpO2 93 %.  PHYSICAL EXAMINATION:  GENERAL:  73 y.o.-year-old patient lying in the bed with no acute distress.  EYES: Pupils equal, round, reactive to light and accommodation. No scleral icterus. Extraocular muscles intact.  HEENT: Head atraumatic, normocephalic. Oropharynx and nasopharynx clear.  NECK:  Supple, no jugular venous distention. No thyroid enlargement, no tenderness.  LUNGS: Diminished breath sound bilaterally.  Faint expiratory wheeze noted. CARDIOVASCULAR: S1, S2 normal. No murmurs, rubs, or gallops.  ABDOMEN: Soft, nontender, nondistended. Bowel sounds present. No organomegaly or mass.  EXTREMITIES: No pedal edema, cyanosis, or clubbing.  NEUROLOGIC: Cranial nerves II through XII are intact. Muscle strength 5/5 in all extremities. Sensation intact. Gait not checked.  PSYCHIATRIC:  The patient is alert and oriented x 3.  SKIN: No obvious rash, lesion, or ulcer.    LABORATORY PANEL:   CBC Recent Labs  Lab 02/11/19 0850  WBC 34.0*  HGB 11.9*  HCT 37.3  PLT 314   ------------------------------------------------------------------------------------------------------------------  Chemistries  Recent Labs  Lab 02/05/19 2122  02/06/19 0701  02/11/19 0850  NA 136   < >  --    < > 138  K 3.6   < >  --    < > 3.7  CL 96*   < >  --    < > 101  CO2 24   < >  --    < > 28  GLUCOSE 307*   < >  --    < > 196*  BUN 29*   < >  --    < > 16  CREATININE 0.98   < >  --    < > 0.72  CALCIUM 9.0   < >  --    < > 8.2*  MG  --   --  2.3  --   --   AST 68*  --   --   --   --   ALT 42  --   --   --   --   ALKPHOS 94  --   --   --   --   BILITOT 1.2  --   --   --   --    < > = values in this interval not displayed.   ------------------------------------------------------------------------------------------------------------------  Cardiac Enzymes Recent Labs  Lab 02/05/19 2122  TROPONINI 0.03*   ------------------------------------------------------------------------------------------------------------------  RADIOLOGY:  Dg Chest Port 1 View  Result Date: 02/09/2019 CLINICAL DATA:  Shortness of breath, recent surgery EXAM: PORTABLE CHEST 1 VIEW COMPARISON:  02/08/2019 FINDINGS: No change in low volume AP portable examination with probable bibasilar scarring and/or atelectasis. No new or focal airspace opacity. IMPRESSION: No change in low volume AP portable examination with probable bibasilar scarring and/or atelectasis. No new or focal airspace opacity. Electronically Signed   By: Eddie Candle M.D.   On: 02/09/2019 15:28    EKG:   Orders placed or performed during the hospital encounter of 02/05/19  . ED EKG  . ED EKG    ASSESSMENT AND PLAN:  Patient 73 year old with hip fracture  #1  sepsis present on admission secondary to Pseudomonas UTI Continue  cefepime WBC continues to be elevated etiology unclear I have asked for a peripheral smear for flow cytometry I will ask ID and hematology to see the patient there is no evidence of fever at this point   #2  Hypertensive urgency secondary to right hip pain, controlled.  #3 diabetes mellitus type 2, continue Lantus, sliding scale insulin with coverage adjusted the dose.  #5 right hip fracture status post repair continue physical therapy  #6 hypoxia cxr negative   #7 history of peripheral artery disease, followed by vascular surgery .  #8 slightly elevated troponin secondary to sepsis.  Due to demand ischemia  #9 cough due to acute bronchitis continue current therapy  More than 50% time spent in counseling, coordination of care.  .  All the records are reviewed and case discussed with Care Management/Social Workerr. Management plans discussed with the patient, family and they are in agreement.  CODE STATUS: Full code  TOTAL TIME TAKING CARE OF THIS PATIENT: 36minutes.   POSSIBLE D/C IN 1-2DAYS, DEPENDING ON CLINICAL CONDITION.   Dustin Flock M.D on 02/11/2019 at 10:50 AM  Between 7am to 6pm - Pager - [phone]  After 6pm go to [email or web address] - password [email or web address] [facility]  Office  [phone]  CC: Primary care physician; Albina Billet, MD   Note: This dictation was prepared with Dragon dictation along with smaller phrase technology. Any transcriptional errors that result from this process are unintentional.

## 2019-02-11 NOTE — Progress Notes (Signed)
Subjective:  POD #4 s/p IM fixation for right IT/subtroch fracture.   Patient reports right pain as mild.  Patient OOB to a chair.  WBC remains high at 34 today.  Objective:   VITALS:   Vitals:   02/10/19 2122 02/11/19 0630 02/11/19 0751 02/11/19 0838  BP: (!) 155/60 (!) 156/66 (!) 166/70   Pulse: (!) 107 95 98   Resp: 20 18 20    Temp: 98.1 F (36.7 C) 98.4 F (36.9 C) 98.3 F (36.8 C)   TempSrc:      SpO2: (!) 88% 90% 91% 93%  Weight:      Height:        PHYSICAL EXAM: right extremity:   Neurovascular intact Sensation intact distally Intact pulses distally Dorsiflexion/Plantar flexion intact Incision: no active drainage.  Scant dried drainage on superior most incision No cellulitis present Compartment soft  LABS  Results for orders placed or performed during the hospital encounter of 02/05/19 (from the past 24 hour(s))  Glucose, capillary     Status: Abnormal   Collection Time: 02/10/19  5:19 PM  Result Value Ref Range   Glucose-Capillary 152 (H) 70 - 99 mg/dL  Glucose, capillary     Status: Abnormal   Collection Time: 02/11/19  7:47 AM  Result Value Ref Range   Glucose-Capillary 166 (H) 70 - 99 mg/dL  CBC with Differential/Platelet     Status: Abnormal   Collection Time: 02/11/19  8:50 AM  Result Value Ref Range   WBC 34.0 (H) 4.0 - 10.5 K/uL   RBC 3.98 3.87 - 5.11 MIL/uL   Hemoglobin 11.9 (L) 12.0 - 15.0 g/dL   HCT 37.3 36.0 - 46.0 %   MCV 93.7 80.0 - 100.0 fL   MCH 29.9 26.0 - 34.0 pg   MCHC 31.9 30.0 - 36.0 g/dL   RDW 14.5 11.5 - 15.5 %   Platelets 314 150 - 400 K/uL   nRBC 0.2 0.0 - 0.2 %   Neutrophils Relative % 58 %   Neutro Abs 19.9 (H) 1.7 - 7.7 K/uL   Lymphocytes Relative 11 %   Lymphs Abs 3.9 0.7 - 4.0 K/uL   Monocytes Relative 6 %   Monocytes Absolute 2.0 (H) 0.1 - 1.0 K/uL   Eosinophils Relative 4 %   Eosinophils Absolute 1.3 (H) 0.0 - 0.5 K/uL   Basophils Relative 0 %   Basophils Absolute 0.1 0.0 - 0.1 K/uL   WBC Morphology MILD LEFT  SHIFT (1-5% METAS, OCC MYELO, OCC BANDS)    RBC Morphology MORPHOLOGY UNREMARKABLE    Smear Review Normal platelet morphology    Immature Granulocytes 21 %   Abs Immature Granulocytes 6.95 (H) 0.00 - 0.07 K/uL  Basic metabolic panel     Status: Abnormal   Collection Time: 02/11/19  8:50 AM  Result Value Ref Range   Sodium 138 135 - 145 mmol/L   Potassium 3.7 3.5 - 5.1 mmol/L   Chloride 101 98 - 111 mmol/L   CO2 28 22 - 32 mmol/L   Glucose, Bld 196 (H) 70 - 99 mg/dL   BUN 16 8 - 23 mg/dL   Creatinine, Ser 0.72 0.44 - 1.00 mg/dL   Calcium 8.2 (L) 8.9 - 10.3 mg/dL   GFR calc non Af Amer >60 >60 mL/min   GFR calc Af Amer >60 >60 mL/min   Anion gap 9 5 - 15  Pathologist smear review     Status: None   Collection Time: 02/11/19  8:50 AM  Result  Value Ref Range   Path Review Peripheral blood smear is reviewed.   Glucose, capillary     Status: Abnormal   Collection Time: 02/11/19 11:43 AM  Result Value Ref Range   Glucose-Capillary 204 (H) 70 - 99 mg/dL    Dg Chest Port 1 View  Result Date: 02/09/2019 CLINICAL DATA:  Shortness of breath, recent surgery EXAM: PORTABLE CHEST 1 VIEW COMPARISON:  02/08/2019 FINDINGS: No change in low volume AP portable examination with probable bibasilar scarring and/or atelectasis. No new or focal airspace opacity. IMPRESSION: No change in low volume AP portable examination with probable bibasilar scarring and/or atelectasis. No new or focal airspace opacity. Electronically Signed   By: Eddie Candle M.D.   On: 02/09/2019 15:28    Assessment/Plan: 4 Days Post-Op   Active Problems:   S/P right hip fracture  Patient on Cipro currently.  Had UTI sepsis on admission.   Afebrile.  Making progress with PT.  Appreciate ID consult.  Continue with PT.   Follow up with me in the office in 2 weeks after discharge.   Antibiotics per ID.  Continue lovenox 40 mg daily for DVT prophylaxis x 4 weeks post-op.    Thornton Park , MD 02/11/2019, 12:41 PM

## 2019-02-11 NOTE — Consult Note (Signed)
[facility]  Date of admission:  02/05/2019  Inpatient day:  02/11/2019  Consulting physician:  Dr. Dustin Flock   Reason for Consultation:  Leukocytosis  Chief Complaint: April [name] is a 73 y.o. female who was admitted through the emergency room with a right hip fracture secondary to a fall.  HPI:  The patient is unaware of a prior history of an elevated WBC.  Review of CBCs dating back to 10/20/2015 reveals a WBC ranging from 13,900 - 50,500 without trend.  Differential has been predominantly neutrophils.  WBC has peaked during episodes of infection (Proteus UTI in 09/2015).  The patient presented to [facility] on 02/05/2019 s/p a fall.   Plain films revealed an acute intertrochanteric right femur fracture.  She underwent right intramedullary fixation for intertrochanteric hip fracture on 02/07/2019.  Hospitalization has been complicated by a Pseudomonas UTI.  She has been on antibiotics (currently ciprofloxacin).  WBC on admission was 35,100 with an [place] of 31,300.  WBC today is 34,000 with an [place] of 19,900.  Peripheral smear revealed leukocytosis with absolute neutrophilia, eosinophilia, and monocytosis. Left shift was present as well focal toxic granulation.  There was mild normocytic anemia with occasional nucleated RBC. There was focal platelet clumping. Findings were interpreted as reactive.  Flow cytometry is pending.  Symptomatically, she denies any fever.  She denies any recent steroids.  She denies any skin breakdown.    Past Medical History:  Diagnosis Date  . Acute renal failure (ARF) ([place])   . Difficult intubation   . Hypertension   . NSTEMI (non-ST elevated myocardial infarction) ([place])   . Paroxysmal atrial fibrillation (HCC)   . Renal disorder   . Sepsis [facility]) January 2017   [facility]  . Type 2 diabetes mellitus ([place])     Past Surgical History:  Procedure Laterality Date  . CYSTOSCOPY WITH STENT PLACEMENT Right 11/03/2015   Procedure: CYSTOSCOPY WITH  STENT PLACEMENT;  Surgeon: Hollice Espy, MD;  Location: [facility] ORS;  Service: Urology;  Laterality: Right;  . FRACTURE SURGERY Right    [place]  . INTRAMEDULLARY (IM) NAIL INTERTROCHANTERIC Right 02/07/2019   Procedure: INTRAMEDULLARY (IM) NAIL INTERTROCHANTRIC;  Surgeon: Thornton Park, MD;  Location: [facility] ORS;  Service: Orthopedics;  Laterality: Right;  . TONSILLECTOMY    . 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 Diabetes Father   . Heart disease Father   . Stroke Maternal Grandmother     Social History:  reports that she has never smoked. She has never used smokeless tobacco. She reports that she does not drink alcohol or use drugs.  She denies any exposure to radiation or toxins.  She is retired.  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  She is alone today.  Allergies:  Allergies  Allergen Reactions  . Prednisone Anaphylaxis    Medications Prior to Admission  Medication Sig Dispense Refill  . acetaminophen (TYLENOL) 500 MG tablet Take 1,000 mg by mouth every 8 (eight) hours as needed for moderate pain. Reported on 12/16/2015    . aspirin EC 81 MG EC tablet Take 1 tablet (81 mg total) by mouth daily. 90 tablet 3  . diclofenac sodium (VOLTAREN) 1 % GEL Apply 4 g topically 4 (four) times daily.    Marland Kitchen docusate sodium (COLACE) 100 MG capsule Take 1 capsule (100 mg  total) by mouth 2 (two) times daily. 10 capsule 0  . gabapentin (NEURONTIN) 300 MG capsule Take 300 mg by mouth at bedtime.    . insulin glargine (LANTUS) 100 UNIT/ML injection Inject 0.15 mLs (15 Units total) into the skin daily. 10 mL 11  . meloxicam (MOBIC) 15 MG tablet Take 15 mg by mouth daily.    . metFORMIN (GLUCOPHAGE) 500 MG tablet Take 500 mg by mouth 2 (two) times daily.     . metoprolol tartrate (LOPRESSOR) 25 MG tablet Take 1 tablet (25 mg  total) by mouth 2 (two) times daily.    Marland Kitchen oxyCODONE (OXY IR/ROXICODONE) 5 MG immediate release tablet Take 1 tablet (5 mg total) by mouth every 6 (six) hours as needed for moderate pain or severe pain. 30 tablet 0  . polyethylene glycol (MIRALAX / GLYCOLAX) packet Take 17 g by mouth daily. 14 each 0  . pravastatin (PRAVACHOL) 20 MG tablet     . tamsulosin (FLOMAX) 0.4 MG CAPS capsule Take 1 capsule (0.4 mg total) by mouth daily. 30 capsule   . traMADol (ULTRAM) 50 MG tablet Take 50-100 mg by mouth at bedtime.     . traZODone (DESYREL) 50 MG tablet Take 50 mg by mouth at bedtime.      Review of Systems: GENERAL:  Fatigue.  No fevers, sweats or weight loss. PERFORMANCE STATUS (ECOG):  2* HEENT:  No visual changes, runny nose, sore throat, dental issues, mouth sores or tenderness. Lungs: No shortness of breath.  Slight cough and "dab pf pneumonia".  No hemoptysis. Cardiac:  No chest pain, palpitations, orthopnea, or PND. GI:  No nausea, vomiting, diarrhea, constipation, melena or hematochezia. GU:  No urgency, frequency, dysuria, or hematuria. Musculoskeletal:  No back pain.  No joint pain.  No muscle tenderness. Extremities:  No pain or swelling. Skin:  Bruises easily.  No rashes or skin changes. Neuro:  No headache, numbness or weakness, balance or coordination issues. Endocrine:  Diabetes.  No thyroid issues, hot flashes or night sweats. Psych:  No mood changes, depression or anxiety. Pain:  No focal pain. Review of systems:  All other systems reviewed and found to be negative.  Physical Exam:  Blood pressure (!) 150/52, pulse 99, temperature 97.8 F (36.6 C), temperature source Oral, resp. rate 20, height 5\' 6"  (1.676 m), weight 260 lb (117.9 kg), SpO2 92 %.  GENERAL:  Well developed, well nourished, heavyset woman sitting comfortably on the medical unit in no acute distress. MENTAL STATUS:  Alert and oriented to person, place and time. HEAD:  Towel across forehead.  Short gray hair.   Normocephalic, atraumatic, face symmetric, no Cushingoid features. EYES:  Blue eyes.  Pupils equal round and reactive to light and accomodation.  No conjunctivitis or scleral icterus. ENT:  Grenola in place.  Oropharynx clear without lesion.  Tongue normal. Mucous membranes moist.  RESPIRATORY:  Clear to auscultation anteriorly without rales, wheezes or rhonchi. CARDIOVASCULAR:  Regular rate and rhythm without murmur, rub or gallop. ABDOMEN:  Fully round.  Soft, non-tender, with active bowel sounds, and no appreciable hepatosplenomegaly.  No masses. SKIN:  Right hip surgical site unremarkable.  No rashes, ulcers or lesions. EXTREMITIES:  ICDs in place.  Slight tender lower extremity edema (right > left).  No skin discoloration or tenderness.  No palpable cords. LYMPH NODES: No palpable cervical, supraclavicular, axillary or inguinal adenopathy  NEUROLOGICAL: Unremarkable. PSYCH:  Appropriate.   Results for orders placed or performed during the hospital encounter of 02/05/19 (from  the past 48 hour(s))  CBC     Status: Abnormal   Collection Time: 02/10/19  5:20 AM  Result Value Ref Range   WBC 27.4 (H) 4.0 - 10.5 K/uL   RBC 3.82 (L) 3.87 - 5.11 MIL/uL   Hemoglobin 11.5 (L) 12.0 - 15.0 g/dL   HCT 35.7 (L) 36.0 - 46.0 %   MCV 93.5 80.0 - 100.0 fL   MCH 30.1 26.0 - 34.0 pg   MCHC 32.2 30.0 - 36.0 g/dL   RDW 14.6 11.5 - 15.5 %   Platelets 243 150 - 400 K/uL   nRBC 0.1 0.0 - 0.2 %    Comment: Performed at [facility], [address] [place], [place] [address]  Basic metabolic panel     Status: Abnormal   Collection Time: 02/10/19  5:20 AM  Result Value Ref Range   Sodium 140 135 - 145 mmol/L   Potassium 3.9 3.5 - 5.1 mmol/L   Chloride 107 98 - 111 mmol/L   CO2 27 22 - 32 mmol/L   Glucose, Bld 153 (H) 70 - 99 mg/dL   BUN 23 8 - 23 mg/dL   Creatinine, Ser 0.74 0.44 - 1.00 mg/dL   Calcium 8.1 (L) 8.9 - 10.3 mg/dL   GFR calc non Af Amer >60 >60 mL/min   GFR calc Af Amer >60 >60  mL/min   Anion gap 6 5 - 15    Comment: Performed at [facility], [address] [place], [place] [address]  Glucose, capillary     Status: Abnormal   Collection Time: 02/10/19  7:37 AM  Result Value Ref Range   Glucose-Capillary 219 (H) 70 - 99 mg/dL  Glucose, capillary     Status: Abnormal   Collection Time: 02/10/19 12:30 PM  Result Value Ref Range   Glucose-Capillary 213 (H) 70 - 99 mg/dL  Glucose, capillary     Status: Abnormal   Collection Time: 02/10/19  5:19 PM  Result Value Ref Range   Glucose-Capillary 152 (H) 70 - 99 mg/dL  Glucose, capillary     Status: Abnormal   Collection Time: 02/11/19  7:47 AM  Result Value Ref Range   Glucose-Capillary 166 (H) 70 - 99 mg/dL  CBC with Differential/Platelet     Status: Abnormal   Collection Time: 02/11/19  8:50 AM  Result Value Ref Range   WBC 34.0 (H) 4.0 - 10.5 K/uL   RBC 3.98 3.87 - 5.11 MIL/uL   Hemoglobin 11.9 (L) 12.0 - 15.0 g/dL   HCT 37.3 36.0 - 46.0 %   MCV 93.7 80.0 - 100.0 fL   MCH 29.9 26.0 - 34.0 pg   MCHC 31.9 30.0 - 36.0 g/dL   RDW 14.5 11.5 - 15.5 %   Platelets 314 150 - 400 K/uL   nRBC 0.2 0.0 - 0.2 %   Neutrophils Relative % 58 %   Neutro Abs 19.9 (H) 1.7 - 7.7 K/uL   Lymphocytes Relative 11 %   Lymphs Abs 3.9 0.7 - 4.0 K/uL   Monocytes Relative 6 %   Monocytes Absolute 2.0 (H) 0.1 - 1.0 K/uL   Eosinophils Relative 4 %   Eosinophils Absolute 1.3 (H) 0.0 - 0.5 K/uL   Basophils Relative 0 %   Basophils Absolute 0.1 0.0 - 0.1 K/uL   WBC Morphology MILD LEFT SHIFT (1-5% METAS, OCC MYELO, OCC BANDS)    RBC Morphology MORPHOLOGY UNREMARKABLE    Smear Review Normal platelet morphology    Immature Granulocytes  21 %   Abs Immature Granulocytes 6.95 (H) 0.00 - 0.07 K/uL    Comment: Performed at [facility], [address] [place], [place] [address]  Basic metabolic panel     Status: Abnormal   Collection Time: 02/11/19  8:50 AM  Result Value Ref Range   Sodium 138 135 - 145 mmol/L    Potassium 3.7 3.5 - 5.1 mmol/L   Chloride 101 98 - 111 mmol/L   CO2 28 22 - 32 mmol/L   Glucose, Bld 196 (H) 70 - 99 mg/dL   BUN 16 8 - 23 mg/dL   Creatinine, Ser 0.72 0.44 - 1.00 mg/dL   Calcium 8.2 (L) 8.9 - 10.3 mg/dL   GFR calc non Af Amer >60 >60 mL/min   GFR calc Af Amer >60 >60 mL/min   Anion gap 9 5 - 15    Comment: Performed at [facility], [address], [place], [place] [address]  Pathologist smear review     Status: None   Collection Time: 02/11/19  8:50 AM  Result Value Ref Range   Path Review Peripheral blood smear is reviewed.     Comment: Patient with sepsis secondary to UTI, recent surgery for hip fracture. Leukocytosis with absolute neutrophilia, eosinophilia, and monocytosis. Left shift is present as well focal toxic granulation. Mild normocytic anemia with occasional nucleated RBC. Focal platelet clumping. Normal morphology. Findings are interpreted as reactive and secondary to the patients recent sepsis and physiologic stress due to surgery. Reviewed by Dellia Nims Reuel Derby, M.D. Performed at [facility], [place], [place] [address]   Glucose, capillary     Status: Abnormal   Collection Time: 02/11/19 11:43 AM  Result Value Ref Range   Glucose-Capillary 204 (H) 70 - 99 mg/dL  Glucose, capillary     Status: Abnormal   Collection Time: 02/11/19  4:48 PM  Result Value Ref Range   Glucose-Capillary 139 (H) 70 - 99 mg/dL   US Renal  Result Date: 02/11/2019 CLINICAL DATA:  Hydronephrosis EXAM: RENAL / URINARY TRACT ULTRASOUND COMPLETE COMPARISON:  CT abdomen 01/05/2016 FINDINGS: Right Kidney: Renal measurements: 12.4 x 6.4 x 6.7 cm = volume: 279 mL . Echogenicity within normal limits. No mass or hydronephrosis visualized. Left Kidney: Renal measurements: 11.9 x 6.4 x 4.5 cm = volume: 182 mL. Echogenicity within normal limits. No mass or hydronephrosis visualized. Bladder: Appears normal for degree of bladder distention. Other: 5.7 x  5.9 x 5.3 cm cystic left adnexal mass likely reflecting an ovarian cyst. IMPRESSION: 1. No obstructive uropathy. 2. 5.7 x 5.9 x 5.3 cm cystic left adnexal mass likely reflecting an ovarian cyst. Electronically Signed   By: Kathreen Devoid   On: 02/11/2019 15:59    Assessment:  The patient is a 73 y.o. woman with probable reactive leukocytosis.  WBC has ranged from 13,900 - 50,500 without trend since 2017.  Differential has been predominantly neutrophils.  WBC has peaked during episodes of infection (Proteus UTI in 09/2015).  Patient is s/p right femur fracture.  She is being treated for a Pseudomonas UTI.  Peripheral smear reveals neutrophilia, eosinophilia, and monocytosis. Left shift was noted with focal toxic granulation.  No blasts were seen.  Symptomatically, she is fatigued.  She has had no fever.  Exam reveals no new focus of infection.  Plan:   1.   Leukocytosis  Chronic in nature.  Etiology is felt reactive.  Follow-up peripheral blood flow cytometry- suspect will be negative.  Check  BCR-ABL secondary to persistent nature of elevated counts. 2.   Anticipate follow-up in the outpatient department after discharge.  Thank you for allowing me to participate in April [name] 's care.  I will follow her closely with you while hospitalized and after discharge in the outpatient department.   Lequita Asal, MD  02/11/2019, 9:58 PM

## 2019-02-11 NOTE — Progress Notes (Signed)
Occupational Therapy Treatment Patient Details Name: April [name] MRN: [id number] DOB: 10/07/1945 Today's Date: 02/11/2019    History of present illness 73 yo Female fell at home and sustained a right hip fracture. She is POD#1 for RLE ORIF. Upon admission she was found to have UTI with elevated glucose levels. Patient is WBAT. PMH: ARF, HTN, NSTEMI, A-fib, Sepsis, Diabetes x2, past partial foot ambulation bilaterally;    OT comments  Pt seen for OT/PT co-treatment on this date. Upon arrival to room pt awake/alert semi-reclined in bed. Pt agreeable to tx. Pt instructed in safe use of AE for functional mobility as well as bed mobility techniques. While seated EOB pt assisted with grooming ADLs including oral care, face washing, and assisted with lotion application to her back. PT requires +2 min/mod assist for stand pivot transfer to chair. Pt continues to be very limited in mobility and unable to attempt transfers without heavy encouragement from therapists on this date. During mobility pt educated in energy conservation strategies including PLB. Pt return demonstrated technique throughout session with VC's from therapists. Pt making good progress toward goals and continues to benefit from skilled OT services to maximize return to PLOF and minimize risk of future falls, injury, caregiver burden, and readmission. Will continue to follow POC. Discharge recommendation remains appropriate.     Follow Up Recommendations  SNF    Equipment Recommendations  None recommended by OT    Recommendations for Other Services      Precautions / Restrictions Precautions Precautions: Fall Restrictions Weight Bearing Restrictions: Yes RLE Weight Bearing: Weight bearing as tolerated       Mobility Bed Mobility Overal bed mobility: Needs Assistance Bed Mobility: Supine to Sit Rolling: Mod assist;+2 for physical assistance   Supine to sit: Min assist Sit to supine: +2 for physical assistance;Max  assist   General bed mobility comments: Pt very insistent on performing bed mobility on her own. Heavy use of bed rails, verbal and visual cues for hand placement, minA for LE management. bed chuck used to anteriorly scoot pt  Transfers Overall transfer level: Needs assistance Equipment used: Rolling walker (2 wheeled) Transfers: Sit to/from [facility] Sit to Stand: Min assist;+2 safety/equipment Stand pivot transfers: Min assist;+2 safety/equipment       General transfer comment: Constant motivation and cueing needed for patient to attempt transfer. Heavily stabilizing RW by PT/OT and minA to maintain initial standing balance with RW.    Balance Overall balance assessment: Needs assistance Sitting-balance support: Bilateral upper extremity supported;Feet supported Sitting balance-Leahy Scale: Poor Sitting balance - Comments: use of bilateral UE for support, progressed from modA-CGA. Pt most comfortable with at least 1 UE support Postural control: Posterior lean Standing balance support: Bilateral upper extremity supported;During functional activity Standing balance-Leahy Scale: Poor Standing balance comment: +2 assist with RW for transfer/gait                           ADL either performed or assessed with clinical judgement   ADL Overall ADL's : Needs assistance/impaired Eating/Feeding: Sitting;Independent   Grooming: Sitting;Wash/dry face;Oral care;Set up Grooming Details (indicate cue type and reason): Pt required set-up assist and support to maintain seated balance during EOB grooming activity. Pt required increased time/effort to complete oral care and face washing on this date.                  Toilet Transfer: RW;Stand-pivot;BSC;+2 for physical assistance;Moderate assistance  Functional mobility during ADLs: +2 for physical assistance;+2 for safety/equipment;Moderate assistance;Maximal assistance General ADL Comments: Pt  currently very limited in functional mobility. Requires mod/max assist for stand pivot transfers. Pt has been completing bed-level toileting and ADLs. Requested assist to apply lotion to her back and shoulders on this date. OT provided assist. Made little attempt to reach back indepndently while sitting.      Vision Baseline Vision/History: Wears glasses Wears Glasses: Reading only Patient Visual Report: No change from baseline Vision Assessment?: No apparent visual deficits   Perception     Praxis      Cognition Arousal/Alertness: Awake/alert Behavior During Therapy: Anxious Overall Cognitive Status: Within Functional Limits for tasks assessed                                 General Comments: Pt oriented to situation, place, self, pt anxious and emotional during session, intermittent redirecting to task needed        Exercises Other Exercises Other Exercises: Education provided about proper DME use, RW management, and WBAT precautions Other Exercises: Pt educated on ECS including PLB strategies to support increased endurance with functional mobility. Pt also educated on mgt of AE during functional mobility and safe transfer techniques.  Other Exercises: Pt assisted with grooming ADL tasks at EOB including oral care, washing face, and application of lotion to back. Pt required max assist for lotion application to back and regularly asked therapists to "scratch my back for me".    Shoulder Instructions       General Comments      Pertinent Vitals/ Pain       Pain Assessment: Faces Faces Pain Scale: Hurts a little bit Pain Location: with standing, pt endorsed some pain, some sign/symptoms  Pain Descriptors / Indicators: Crying;Moaning;Grimacing;Guarding Pain Intervention(s): Limited activity within patient's tolerance;Monitored during session;Repositioned;Utilized relaxation techniques  Home Living                                           Prior Functioning/Environment              Frequency  Min 1X/week        Progress Toward Goals  OT Goals(current goals can now be found in the care plan section)  Progress towards OT goals: Progressing toward goals  Acute Rehab OT Goals Patient Stated Goal: to get better OT Goal Formulation: With patient Time For Goal Achievement: 02/22/19 Potential to Achieve Goals: Good  Plan Discharge plan remains appropriate;Frequency remains appropriate    Co-evaluation    PT/OT/SLP Co-Evaluation/Treatment: Yes Reason for Co-Treatment: Complexity of the patient's impairments (multi-system involvement);Necessary to address cognition/behavior during functional activity;For patient/therapist safety;To address functional/ADL transfers PT goals addressed during session: Mobility/safety with mobility;Balance;Strengthening/ROM OT goals addressed during session: ADL's and self-care;Proper use of Adaptive equipment and DME;Strengthening/ROM      AM-PAC OT "6 Clicks" Daily Activity     Outcome Measure   Help from another person eating meals?: None Help from another person taking care of personal grooming?: A Little Help from another person toileting, which includes using toliet, bedpan, or urinal?: Total Help from another person bathing (including washing, rinsing, drying)?: A Lot Help from another person to put on and taking off regular upper body clothing?: A Lot Help from another person to put on and taking off regular lower body  clothing?: A Lot 6 Click Score: 14    End of Session Equipment Utilized During Treatment: Gait belt;Rolling walker;Oxygen  OT Visit Diagnosis: Other abnormalities of gait and mobility (R26.89);History of falling (Z91.81);Pain Pain - Right/Left: Right Pain - part of body: Hip   Activity Tolerance Patient tolerated treatment well   Patient Left in bed;with call bell/phone within reach;with bed alarm set;with SCD's reapplied   Nurse Communication           Time: 3500-9381 OT Time Calculation (min): 40 min  Charges: OT General Charges $OT Visit: 1 Visit OT Treatments $Self Care/Home Management : 23-37 mins  Shara Blazing, M.S., OTR/L Ascom: [phone] 02/11/19, 11:26 AM

## 2019-02-11 NOTE — Progress Notes (Signed)
Physical Therapy Treatment Patient Details Name: April [name] MRN: [id number] DOB: 08/20/1946 Today's Date: 02/11/2019    History of Present Illness 73 yo Female fell at home and sustained a right hip fracture. She is POD#1 for RLE ORIF. Upon admission she was found to have UTI with elevated glucose levels. Patient is WBAT. PMH: ARF, HTN, NSTEMI, A-fib, Sepsis, Diabetes x2, past partial foot ambulation bilaterally;     PT Comments    Pt agreeable to mobilize at start of session. Constant redirecting, motivation, education provided by both OT and PT to maximize pt participation and mobility. Pt was able to perform supine to sit with minA for LE management, extended time needed and heavy use of bed rails. Pt able to sit EOB for several minutes with mod-CGA to perform tasks with OT, pt preferred constant unilateral UE support to maintain balance. With extensive rest time and motivation, sit <> stand performed with RW and minAx2, as well as minA for initial standing balance deficits. Heavily stabilizing RW by PT/OT and minA to maintain initial standing balance with RW. Pt was able to take very small shuffled steps towards chair, but complained of fatigue quickly, modAx2 for safe descent to chair. Pt up in chair with all needs in reach at end of session. The patient did demonstrate improvement in functional mobility this session and would continue to benefit from skilled PT intervention.     Follow Up Recommendations  SNF;Supervision/Assistance - 24 hour     Equipment Recommendations  Rolling walker with 5" wheels    Recommendations for Other Services Rehab consult     Precautions / Restrictions Precautions Precautions: Fall Restrictions Weight Bearing Restrictions: Yes RLE Weight Bearing: Weight bearing as tolerated    Mobility  Bed Mobility Overal bed mobility: Needs Assistance Bed Mobility: Supine to Sit     Supine to sit: Min assist     General bed mobility comments: Pt very  insistent on performing bed mobility on her own. Heavy use of bed rails, verbal and visual cues for hand placement, minA for LE management. bed chuck used to anteriorly scoot pt  Transfers Overall transfer level: Needs assistance Equipment used: Rolling walker (2 wheeled) Transfers: Sit to/from [facility] Sit to Stand: Min assist;+2 safety/equipment Stand pivot transfers: Min assist;+2 safety/equipment       General transfer comment: Constant motivation and cueing needed for patient to attempt transfer. Heavily stabilizing RW by PT/OT and minA to maintain initial standing balance with RW.  Ambulation/Gait Ambulation/Gait assistance: Mod assist;+2 physical assistance;+2 safety/equipment Gait Distance (Feet): 3 Feet Assistive device: Rolling walker (2 wheeled)       General Gait Details: Pt able to take very small, shuffled steps towards chair with constant verbal cueing. Fatigued very quickly, wanted to sit prior to full turn towards chair, modAx2 for descent to chair.   Stairs             Wheelchair Mobility    Modified Rankin (Stroke Patients Only)       Balance Overall balance assessment: Needs assistance Sitting-balance support: Bilateral upper extremity supported;Feet supported Sitting balance-Leahy Scale: Poor Sitting balance - Comments: use of bilateral UE for support, progressed from modA-CGA. Pt most comfortable with at least 1 UE support     Standing balance-Leahy Scale: Poor Standing balance comment: +2 assist with RW for transfer/gait  Cognition Arousal/Alertness: Awake/alert Behavior During Therapy: Anxious Overall Cognitive Status: Within Functional Limits for tasks assessed                                 General Comments: Pt oriented to situation, place, self, pt anxious and emotional during session, intermittent redirecting to task needed      Exercises Other Exercises Other  Exercises: Education provided about proper DME use, RW management, and WBAT precautions    General Comments        Pertinent Vitals/Pain Pain Assessment: Faces Faces Pain Scale: Hurts a little bit Pain Location: with standing, pt endorsed some pain, some sign/symptoms  Pain Descriptors / Indicators: Crying;Moaning;Grimacing;Guarding Pain Intervention(s): Limited activity within patient's tolerance;Monitored during session;Repositioned    Home Living                      Prior Function            PT Goals (current goals can now be found in the care plan section) Progress towards PT goals: Progressing toward goals    Frequency    BID      PT Plan Current plan remains appropriate    Co-evaluation   Reason for Co-Treatment: Complexity of the patient's impairments (multi-system involvement);Necessary to address cognition/behavior during functional activity;For patient/therapist safety;To address functional/ADL transfers PT goals addressed during session: Mobility/safety with mobility;Balance;Strengthening/ROM OT goals addressed during session: ADL's and self-care;Proper use of Adaptive equipment and DME;Strengthening/ROM      AM-PAC PT "6 Clicks" Mobility   Outcome Measure  Help needed turning from your back to your side while in a flat bed without using bedrails?: A Lot Help needed moving from lying on your back to sitting on the side of a flat bed without using bedrails?: A Lot Help needed moving to and from a bed to a chair (including a wheelchair)?: A Lot Help needed standing up from a chair using your arms (e.g., wheelchair or bedside chair)?: A Lot Help needed to walk in hospital room?: A Lot Help needed climbing 3-5 steps with a railing? : Total 6 Click Score: 11    End of Session Equipment Utilized During Treatment: Gait belt;Oxygen(1L) Activity Tolerance: Patient limited by fatigue;Patient limited by pain Patient left: with call bell/phone within  reach;in chair;with chair alarm set Nurse Communication: Mobility status PT Visit Diagnosis: Unsteadiness on feet (R26.81);Muscle weakness (generalized) (M62.81);Pain Pain - Right/Left: Right Pain - part of body: Hip     Time: 0911-0951 PT Time Calculation (min) (ACUTE ONLY): 40 min  Charges:  $Therapeutic Activity: 8-22 mins                    Lieutenant Diego PT, DPT 11:04 AM,02/11/19 [phone]

## 2019-02-11 NOTE — Consult Note (Signed)
NAME: April [name]  DOB: 06-07-1946  MRN: [id number]  Date/Time: 02/11/2019 12:17 PM  REQUESTING PROVIDER: Dr. Posey Pronto Subjective:  REASON FOR CONSULT: Leukocytosis ? [name] is a 73 y.o. female with a history of diabetes mellitus, paroxysmal A. 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  In the ED she had a blood pressure of 154/128, temperature of 98.2, SaO2 of 92% and heart rate of 111.  UA revealed WBC of more than 50 blood glucose of more than 500 and RBCs 6-10.  WBC was 35.1, Hb of 16.4 and platelet of 209.  She is started on broad-spectrum antibiotic after blood and urine culture was sent.  Blood culture was negative and urine culture was Pseudomonas which was pansensitive.  She underwent right intramedullary fixation for intertrochanteric hip fracture on 02/07/2019.  I am asked to see the patient as the WBC continues to be high.  On reviewing her chart she has had leukocytosis since 2017.  Her WBC has never been below 14.  The range has been in the 20s most of the time.  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  Pt says she lives on her own and walks with a Rolator. First week of May while in the toilet ,she sat heaving on it with a force and broke the toilet including the plumbing and water was gushing out which was fixed by the plumber- She says she did not have any pain on the hip or bruising. She told her home help later in the week ( after 4 days)  who asked her to go to  the ED and get it checked. Pt did not do so immediately and 3 days later on 02/05/19 she felt her leg give up and  fell and was brought to the ED .  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 CYSTOSCOPY WITH STENT PLACEMENT Right 11/03/2015   Procedure: CYSTOSCOPY WITH STENT PLACEMENT;  Surgeon: Hollice Espy, MD;  Location: [facility] ORS;  Service: Urology;  Laterality: Right;  . FRACTURE SURGERY Right    Elbow  . INTRAMEDULLARY (IM) NAIL INTERTROCHANTERIC Right 02/07/2019   Procedure: INTRAMEDULLARY (IM) NAIL INTERTROCHANTRIC;  Surgeon: Thornton Park, MD;  Location: [facility] OR[facility];  Service: Orthopedics;  Laterality: Right;  . TONSILLECTOMY    . 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 Diabetes Father   . Heart disease Father   . Stroke Maternal Grandmother  Allergies  Allergen Reactions  . Prednisone Anaphylaxis   ? Current Facility-Administered Medications  Medication Dose Route Frequency Provider Last Rate Last Dose  . acetaminophen (TYLENOL) tablet 325-650 mg  325-650 mg Oral Q6H PRN Thornton Park, MD   650 mg at 02/11/19 0814  . alum & mag hydroxide-simeth (MAALOX/MYLANTA) 200-200-20 MG/5ML suspension 30 mL  30 mL Oral Q4H PRN Thornton Park, MD      . bisacodyl (DULCOLAX) suppository 10 mg  10 mg Rectal Daily PRN Thornton Park, MD      . ciprofloxacin (CIPRO) tablet 500 mg  500 mg Oral BID Epifanio Lesches, MD   500 mg at  02/11/19 0814  . diclofenac sodium (VOLTAREN) 1 % transdermal gel 4 g  4 g Topical QID Thornton Park, MD   4 g at 02/11/19 1116  . docusate sodium (COLACE) capsule 100 mg  100 mg Oral BID Thornton Park, MD   100 mg at 02/11/19 1112  . enoxaparin (LOVENOX) injection 40 mg  40 mg Subcutaneous Q12H Thornton Park, MD   40 mg at 02/11/19 0814  . fentaNYL (SUBLIMAZE) injection 50 mcg  50 mcg Intravenous Q1H PRN Thornton Park, MD   50 mcg at 02/05/19 2216  . ferrous sulfate tablet 325 mg  325 mg Oral TID Lorre Munroe, MD   325 mg at 02/11/19 1111  . gabapentin (NEURONTIN) capsule 300 mg  300 mg Oral QHS Thornton Park, MD   300 mg at 02/10/19 2134  . guaiFENesin (MUCINEX) 12 hr tablet 600 mg  600 mg Oral BID Epifanio Lesches, MD   600 mg at 02/11/19 1111  . HYDROmorphone (DILAUDID) injection 0.5-1 mg  0.5-1 mg Intravenous Q4H PRN Thornton Park, MD      . insulin aspart (novoLOG) injection 0-15 Units  0-15 Units Subcutaneous TID WC Epifanio Lesches, MD   3 Units at 02/11/19 [phone]  . insulin glargine (LANTUS) injection 14 Units  14 Units Subcutaneous Daily Epifanio Lesches, MD   14 Units at 02/11/19 1113  . ipratropium-albuterol (DUONEB) 0.5-2.5 (3) MG/3ML nebulizer solution 3 mL  3 mL Nebulization BID Dustin Flock, MD   3 mL at 02/11/19 9381  . labetalol (NORMODYNE) injection 20 mg  20 mg Intravenous Q3H PRN Thornton Park, MD      . magnesium hydroxide (MILK OF MAGNESIA) suspension 30 mL  30 mL Oral Daily PRN Thornton Park, MD      . menthol-cetylpyridinium (CEPACOL) lozenge 3 mg  1 lozenge Oral PRN Thornton Park, MD       Or  . phenol (CHLORASEPTIC) mouth spray 1 spray  1 spray Mouth/Throat PRN Thornton Park, MD      . methocarbamol (ROBAXIN) tablet 500 mg  500 mg Oral Q6H PRN Thornton Park, MD       Or  . methocarbamol (ROBAXIN) 500 mg in dextrose 5 % 50 mL IVPB  500 mg Intravenous Q6H PRN Thornton Park, MD      . metoprolol tartrate (LOPRESSOR)  tablet 25 mg  25 mg Oral BID Thornton Park, MD   25 mg at 02/11/19 1112  . ondansetron (ZOFRAN) tablet 4 mg  4 mg Oral Q6H PRN Thornton Park, MD       Or  . ondansetron [facility]) injection 4 mg  4 mg Intravenous Q6H PRN Thornton Park, MD      . oxyCODONE (Oxy IR/ROXICODONE) immediate release tablet 10-15 mg  10-15 mg Oral Q4H PRN Thornton Park, MD      . oxyCODONE (Oxy IR/ROXICODONE)  immediate release tablet 5-10 mg  5-10 mg Oral Q4H PRN Thornton Park, MD   10 mg at 02/11/19 0938  . polyethylene glycol (MIRALAX / GLYCOLAX) packet 17 g  17 g Oral Daily PRN Thornton Park, MD      . pravastatin (PRAVACHOL) tablet 20 mg  20 mg Oral Daily Thornton Park, MD   20 mg at 02/11/19 1112  . tamsulosin (FLOMAX) capsule 0.4 mg  0.4 mg Oral Daily Thornton Park, MD      . traZODone (DESYREL) tablet 50 mg  50 mg Oral QHS Thornton Park, MD   50 mg at 02/10/19 2134     Abtx:  Anti-infectives (From admission, onward)   Start     Dose/Rate Route Frequency Ordered Stop   02/08/19 1230  ciprofloxacin (CIPRO) tablet 500 mg     500 mg Oral 2 times daily 02/08/19 1220     02/07/19 1025  gentamicin 80 mg in 0.9% sodium chloride 250 mL irrigation  Status:  Discontinued       As needed 02/07/19 1026 02/07/19 1143   02/06/19 2300  vancomycin (VANCOCIN) 1,250 mg in sodium chloride 0.9 % 250 mL IVPB  Status:  Discontinued     1,250 mg 166.7 mL/hr over 90 Minutes Intravenous Every 24 hours 02/06/19 0004 02/06/19 0054   02/06/19 2300  vancomycin (VANCOCIN) 1,250 mg in sodium chloride 0.9 % 250 mL IVPB  Status:  Discontinued     1,250 mg 166.7 mL/hr over 90 Minutes Intravenous Every 24 hours 02/06/19 0054 02/07/19 1548   02/06/19 0853  ceFAZolin (ANCEF) IVPB 2g/100 mL premix  Status:  Discontinued     2 g 200 mL/hr over 30 Minutes Intravenous 30 min pre-op 02/06/19 0853 02/07/19 1326   02/06/19 0600  ceFEPIme (MAXIPIME) 2 g in sodium chloride 0.9 % 100 mL IVPB  Status:  Discontinued     2 g 200  mL/hr over 30 Minutes Intravenous Every 8 hours 02/05/19 2359 02/08/19 1220   02/05/19 2230  ceFEPIme (MAXIPIME) 2 g in sodium chloride 0.9 % 100 mL IVPB     2 g 200 mL/hr over 30 Minutes Intravenous  Once 02/05/19 2226 02/06/19 0005   02/05/19 2230  vancomycin (VANCOCIN) IVPB 1000 mg/200 mL premix  Status:  Discontinued     1,000 mg 200 mL/hr over 60 Minutes Intravenous  Once 02/05/19 2226 02/05/19 2229   02/05/19 2230  vancomycin (VANCOCIN) 2,000 mg in sodium chloride 0.9 % 500 mL IVPB     2,000 mg 250 mL/hr over 120 Minutes Intravenous  Once 02/05/19 2229 02/06/19 0136      REVIEW OF SYSTEMS:  Const: negative fever, negative chills, negative weight loss Eyes: negative diplopia or visual changes, negative eye pain ENT: negative coryza, negative sore throat Resp:++ cough, no hemoptysis, dyspnea Cards: negative for chest pain, palpitations, lower extremity edema GU: negative for frequency, dysuria and hematuria GI: Negative for abdominal pain, diarrhea, bleeding, constipation Skin:  Pruritus back Heme: negative for easy bruising and gum/nose bleeding MS: generalized weakness Neurolo:negative for headaches, dizziness, vertigo, some  memory problems  Psych: negative for feelings of anxiety, depression  Endocrine: has DM Allergy/Immunology- allergic to prednisone Objective:  VITALS:  BP (!) 166/70 (BP Location: Left Arm)   Pulse 98   Temp 98.3 F (36.8 C)   Resp 20   Ht 5\' 6"  (1.676 m)   Wt 117.9 kg   SpO2 93%   BMI 41.97 kg/m  PHYSICAL EXAM:  General: Alert, cooperative, no distress,  appears stated age. very obese, slow to respond, some memory issues Head: Normocephalic, without obvious abnormality, atraumatic. Eyes: Conjunctivae clear, anicteric sclerae. Pupils are equal ENT Nares normal. No drainage or sinus tenderness. Lips, mucosa, and tongue normal. No Thrush Neck: Supple, symmetrical, no adenopathy, thyroid: non tender no carotid bruit and no JVD. Back:has  erythematous area in the place of increased sweating None on the front of the trunk or extermities Lungs:b/l air entry. Heart: s1s2 Abdomen: Soft, non-tender,obese  Bowel sounds normal. No masses Extremities:rt hip surgical site covered with honey comb dressing- no erythema, bruising or discharge B/l TMA - no wound or erythema Skin: No rashes or lesions. Or bruising Lymph: Cervical, supraclavicular normal. Neurologic: Grossly non-focal Pertinent Labs Lab Results CBC      Component Value Date/Time   WBC 34.0 (H) 02/11/2019 0850   RBC 3.98 02/11/2019 0850   HGB 11.9 (L) 02/11/2019 0850   HCT 37.3 02/11/2019 0850   PLT 314 02/11/2019 0850   MCV 93.7 02/11/2019 0850   MCH 29.9 02/11/2019 0850   MCHC 31.9 02/11/2019 0850   RDW 14.5 02/11/2019 0850   LYMPHSABS 3.9 02/11/2019 0850   MONOABS 2.0 (H) 02/11/2019 0850   EOSABS 1.3 (H) 02/11/2019 0850   BASOSABS 0.1 02/11/2019 0850    CMP Latest Ref Rng & Units 02/11/2019 02/10/2019 02/09/2019  Glucose 70 - 99 mg/dL 196(H) 153(H) 173(H)  BUN 8 - 23 mg/dL 16 23 30(H)  Creatinine 0.44 - 1.00 mg/dL 0.72 0.74 0.90  Sodium 135 - 145 mmol/L 138 140 138  Potassium 3.5 - 5.1 mmol/L 3.7 3.9 3.9  Chloride 98 - 111 mmol/L 101 107 108  CO2 22 - 32 mmol/L 28 27 25   Calcium 8.9 - 10.3 mg/dL 8.2(L) 8.1(L) 7.9(L)  Total Protein 6.5 - 8.1 g/dL - - -  Total Bilirubin 0.3 - 1.2 mg/dL - - -  Alkaline Phos 38 - 126 U/L - - -  AST 15 - 41 U/L - - -  ALT 0 - 44 U/L - - -      Microbiology:   IMAGING RESULTS:   I have personally reviewed the films No change in low volume AP portable examination with probable bibasilar scarring and/or atelectasis. No new or focal airspace Opacity.  ? Impression/Recommendation ?73 y.o. female with a history of diabetes mellitus, paroxysmal A. 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  In the ED she had a blood pressure of 154/128, temperature of 98.2, SaO2 of 92% and heart rate of 111.  UA revealed WBC of more than 50 blood glucose of more than 500 and RBCs 6-10.  WBC was 35.1, Hb of 16.4 and platelet of 209.  She is started on broad-spectrum antibiotic after blood and urine culture was sent.  Blood culture was negative and urine culture was Pseudomonas which was pansensitive.  She underwent right intramedullary fixation for intertrochanteric hip fracture on 02/07/2019.  I am asked to see the patient as the WBC continues to be high. ? ? Leucocytosis- could be due to a stress response to the fracture, or following surgery.Also could be due to blood loss ( Has a 4 gram drop) Also had pseudomonas in the urine so could have UTI  but she does not c/o any symptoms related to that. In 2017 she had high wbc and we dont have any other labs in between to suggest normalization of wbc eventhough patient says her PCP has never told her that WBC is high. So nopt sure whether she has ongoing leucocytosis. No evidence of Cdiff clinically. TMA site looks good No evidence of pneumonia  surgical site looks okay and does not infected   Pseudomonas in the urine- is this UTI or asymptomatic bacteruria? Because of previous renal stone and RT PUJ obstruction causing rt mild hydronephrosis , will get US kidney. Will also do bladder scan to look for any residue. Currently on Cipro-day 4-not any change to leucocytosis will Dc tomorrow  if the above imaging comes back normal then would  DC it. Other wise    Fall with fracture rt Femur S/p surgery  DM on insulin    _________________________________________________ Discussed with patient and requesting provider and her nurse Note:  This document was prepared using  Dragon voice recognition software and may include unintentional dictation errors.

## 2019-02-11 NOTE — TOC Progression Note (Signed)
Transition of Care [facility]) - Progression Note    Patient Details  Name: April [name] MRN: [id number] Date of Birth: 09-22-1946  Transition of Care [facility]) CM/SW [place], RN Phone Number: 02/11/2019, 9:46 AM  Clinical Narrative:    Damaris Schooner with Claiborne Billings at [facility] and provided the negative covid test.  She is anticipated to DC to [facility] today Claiborne Billings will call me with the bed inforamtion once obtained  Expected Discharge Plan: [place] Barriers to Discharge: Continued Medical Work up  Expected Discharge Plan and Services Expected Discharge Plan: [place]   Discharge Planning Services: CM Consult Post Acute Care Choice: [place] Living arrangements for the past 2 months: Single Family Home                                       Social Determinants of Health (SDOH) Interventions    Readmission Risk Interventions No flowsheet data found.

## 2019-02-11 NOTE — Progress Notes (Signed)
Inpatient Diabetes Program Recommendations  AACE/ADA: New Consensus Statement on Inpatient Glycemic Control   Target Ranges:  Prepandial:   less than 140 mg/dL      Peak postprandial:   less than 180 mg/dL (1-2 hours)      Critically ill patients:  140 - 180 mg/dL   Results for April [name], April [name] (MRN [id number]) as of 02/11/2019 12:27  Ref. Range 02/10/2019 07:37 02/10/2019 12:30 02/10/2019 17:19 02/11/2019 07:47 02/11/2019 11:43  Glucose-Capillary Latest Ref Range: 70 - 99 mg/dL 219 (H) 213 (H) 152 (H) 166 (H) 204 (H)   Review of Glycemic Control  Current orders for Inpatient glycemic control: Lantus 14 units daily, Novolog 0-15 units TID with meals  Inpatient Diabetes Program Recommendations:   Insulin - Meal Coverage: Please consider ordering Novolog 2 units TID with meals for meal coverage if patient eats at least 50% of meals.  Thanks, Barnie Alderman, RN, MSN, CDE Diabetes Coordinator Inpatient Diabetes Program [phone] (Team Pager from 8am to 5pm)

## 2019-02-12 LAB — GLUCOSE, CAPILLARY
Glucose-Capillary: 170 mg/dL — ABNORMAL HIGH (ref 70–99)
Glucose-Capillary: 197 mg/dL — ABNORMAL HIGH (ref 70–99)

## 2019-02-12 LAB — BASIC METABOLIC PANEL
Anion gap: 8 (ref 5–15)
BUN: 17 mg/dL (ref 8–23)
CO2: 29 mmol/L (ref 22–32)
Calcium: 8.3 mg/dL — ABNORMAL LOW (ref 8.9–10.3)
Chloride: 103 mmol/L (ref 98–111)
Creatinine, Ser: 0.58 mg/dL (ref 0.44–1.00)
GFR calc Af Amer: >60 mL/min (ref >60)
GFR calc non Af Amer: >60 mL/min (ref >60)
Glucose, Bld: 153 mg/dL — ABNORMAL HIGH (ref 70–99)
Potassium: 3.7 mmol/L (ref 3.5–5.1)
Sodium: 140 mmol/L (ref 135–145)

## 2019-02-12 LAB — CBC
HCT: 35.2 % — ABNORMAL LOW (ref 36.0–46.0)
Hemoglobin: 11.3 g/dL — ABNORMAL LOW (ref 12.0–15.0)
MCH: 29.7 pg (ref 26.0–34.0)
MCHC: 32.1 g/dL (ref 30.0–36.0)
MCV: 92.6 fL (ref 80.0–100.0)
Platelets: 348 10*3/uL (ref 150–400)
RBC: 3.8 MIL/uL — ABNORMAL LOW (ref 3.87–5.11)
RDW: 14.5 % (ref 11.5–15.5)
WBC: 33.6 10*3/uL — ABNORMAL HIGH (ref 4.0–10.5)
nRBC: 0.1 % (ref 0.0–0.2)

## 2019-02-12 MED ORDER — SENNA 8.6 MG PO TABS
2.0000 | ORAL_TABLET | Freq: Once | ORAL | Status: AC
  Administered 2019-02-12: 17.2 mg via ORAL
  Filled 2019-02-12: qty 2

## 2019-02-12 MED ORDER — FLEET ENEMA 7-19 GM/118ML RE ENEM
1.0000 | ENEMA | Freq: Once | RECTAL | Status: AC
  Administered 2019-02-12: 1 via RECTAL

## 2019-02-12 MED ORDER — ENOXAPARIN SODIUM 40 MG/0.4ML ~~LOC~~ SOLN: 40.0000 mg | Freq: Two times a day (BID) | SUBCUTANEOUS | Status: DC

## 2019-02-12 MED ORDER — BISACODYL 10 MG RE SUPP
10.0000 mg | Freq: Once | RECTAL | Status: AC
  Administered 2019-02-12: 10 mg via RECTAL

## 2019-02-12 MED ORDER — OXYCODONE HCL 5 MG PO TABS: 5.0000 mg | ORAL_TABLET | Freq: Four times a day (QID) | ORAL | 0 refills | Status: DC | PRN

## 2019-02-12 NOTE — Progress Notes (Signed)
Physical Therapy Treatment Patient Details Name: April [name] MRN: [id number] DOB: 1946-08-16 Today's Date: 02/12/2019    History of Present Illness 73 yo Female fell at home and sustained a right hip fracture. She is POD#1 for RLE ORIF. Upon admission she was found to have UTI with elevated glucose levels. Patient is WBAT. PMH: ARF, HTN, NSTEMI, A-fib, Sepsis, Diabetes x2, past partial foot ambulation bilaterally;     PT Comments    PT and RN entered room to assist pt to [facility]. Supine to sit minA 2+ for safety, pt needed assistance with scooting hips anteriorly and complete trunk elevation, HOB elevated. Returned to supine modAx2. Pt CGA-MinAx2 with sit to stand and transfering BSC. Constant motivation and cueing needed for patient to attempt transfer. Heavily stabilizing RW 2 CGA/minA first attempt, second attempt minAx1. Pt fatigued and sat down quickly despite PT prompts that it was too early, knee block provided to assure pt did not slide off bed. Pt able to perform some seated therapeutic exercises with verbal cues. The patient would benefit from further skilled PT to continue progress towards goals.    Follow Up Recommendations  SNF;Supervision/Assistance - 24 hour     Equipment Recommendations  Rolling walker with 5" wheels    Recommendations for Other Services Rehab consult     Precautions / Restrictions Precautions Precautions: Fall Restrictions Weight Bearing Restrictions: Yes RLE Weight Bearing: Weight bearing as tolerated    Mobility  Bed Mobility Overal bed mobility: Needs Assistance Bed Mobility: Supine to Sit Rolling: Mod assist;+2 for safety/equipment   Supine to sit: Min assist;+2 for physical assistance Sit to supine: Mod assist;+2 for physical assistance   General bed mobility comments: Pt encouraged to participate and assist as much as possible. Repositioned totalA though pt able to reach for rails and bend knees to assist  Transfers Overall transfer  level: Needs assistance Equipment used: Rolling walker (2 wheeled) Transfers: Sit to/from [facility] Sit to Stand: Min assist;+2 safety/equipment Stand pivot transfers: Min assist;+2 safety/equipment       General transfer comment: Constant motivation and cueing needed for patient to attempt transfer. Heavily stabilizing RW 2 CGA/minA first attempt, second attempt minAx1. Pt fatigued and sat down quickly despite PT prompts that it was too early, knee block provided to assure pt did not slide off bed  Ambulation/Gait Ambulation/Gait assistance: Min guard;Min assist;+2 safety/equipment Gait Distance (Feet): 2 Feet Assistive device: Rolling walker (2 wheeled)       General Gait Details: Pt with improved ability to take small shuffled step towards BSC. constant movitation provided.   Stairs             Wheelchair Mobility    Modified Rankin (Stroke Patients Only)       Balance Overall balance assessment: Needs assistance Sitting-balance support: Bilateral upper extremity supported;Feet supported Sitting balance-Leahy Scale: Poor Sitting balance - Comments: use of bilateral UE for support, progressed from modA-CGA. Pt most comfortable with at least 1 UE support     Standing balance-Leahy Scale: Poor Standing balance comment: constant cueing to increase pt motivation and participation                            Cognition Arousal/Alertness: Awake/alert Behavior During Therapy: Anxious Overall Cognitive Status: Within Functional Limits for tasks assessed  Exercises General Exercises - Lower Extremity Ankle Circles/Pumps: AROM;15 reps;Both Long Arc Quad: AROM;15 reps;Strengthening;Right;Seated    General Comments        Pertinent Vitals/Pain Pain Assessment: Faces Faces Pain Scale: Hurts a little bit Pain Location: with transfers Pain Descriptors / Indicators:  Crying;Moaning;Grimacing;Guarding Pain Intervention(s): Limited activity within patient's tolerance;Monitored during session;Repositioned    Home Living                      Prior Function            PT Goals (current goals can now be found in the care plan section) Progress towards PT goals: Progressing toward goals    Frequency    BID      PT Plan Current plan remains appropriate    Co-evaluation              AM-PAC PT "6 Clicks" Mobility   Outcome Measure  Help needed turning from your back to your side while in a flat bed without using bedrails?: A Lot Help needed moving from lying on your back to sitting on the side of a flat bed without using bedrails?: A Lot Help needed moving to and from a bed to a chair (including a wheelchair)?: A Lot Help needed standing up from a chair using your arms (e.g., wheelchair or bedside chair)?: A Lot Help needed to walk in hospital room?: A Lot Help needed climbing 3-5 steps with a railing? : Total 6 Click Score: 11    End of Session   Activity Tolerance: Patient limited by fatigue Patient left: with call bell/phone within reach;with SCD's reapplied;with bed alarm set;in bed Nurse Communication: Mobility status PT Visit Diagnosis: Unsteadiness on feet (R26.81);Muscle weakness (generalized) (M62.81);Pain Pain - Right/Left: Right Pain - part of body: Hip     Time: 1004-1040 PT Time Calculation (min) (ACUTE ONLY): 36 min  Charges:  $Therapeutic Exercise: 8-22 mins $Therapeutic Activity: 8-22 mins                    Lieutenant Diego PT, DPT 11:10 AM,02/12/19 [phone]

## 2019-02-12 NOTE — Discharge Summary (Addendum)
April [name] at [facility], 73 y.o., DOB 08-Nov-1945, MRN [id number]. Admission date: 02/05/2019 Discharge Date 02/12/2019 Primary MD Albina Billet, MD Admitting Physician Christel Mormon, MD  Admission Diagnosis  Closed displaced intertrochanteric fracture of right femur, initial encounter ([place]) [S72.141A] Sepsis with encephalopathy without septic shock, due to unspecified organism ([place]) [A41.9, R65.20, G93.40]  Discharge Diagnosis   Active Problems: Right hip fracture status post repair Sepsis due to Pseudomonas UTI Hypertensive urgency Leukocytosis chronic in nature felt to be reactive will need outpatient follow-up History of peripheral vascular disease Elevated troponin due to demand ischemia Cough due to acute bronchitis   Hospital Course April [name]  is a 73 y.o. obese Caucasian female with a known history of multiple medical problems that will be mentioned below, who presented to the emergency room with acute onset of right hip pain status post accidental mechanical fall.  Upon presentation to the emergency room, her blood pressure was elevated 154/128 with a pulse of 111, respiratory rate of 24 and pulse currently 92% on room air and later 96 to 97%.  Labs were remarkable for borderline potassium of 3.6 and a troponin I 0.03.  Her lactic acid level was 3.1 with procalcitonin of 1.93.  CBC showed remarkable leukocytosis of 35.1 with hemoconcentration of 16.4 hemoglobin and 49.7 hematocrit.  A 9% neutrophils and absolute neutrophils were 31.3 %.  Urinalysis was strongly positive for UTI and showed more than 500 glucose.  Pelvic and hip x-ray showed right hip fracture.  Dr. Mack Guise was notified about the patient.  Patient was admitted to the hospital and was seen by orthopedic surgery and she underwent repair.  Patient also was noted to have UTI and was treated with antibiotics.  Patient noticed to have elevated WBC count.  On further evaluation she was  noted to have chronic leukocytosis.  She was seen in consultation by hematology and ID.  Patient has not had any fevers or chills.   Lovenox to be given for 4 weeks  Oxygen 2 L at all times       Consults  orthopedic surgery , infectious disease and hematology Significant Tests:  See full reports for all details    Ct Head Wo Contrast  Result Date: 02/05/2019 CLINICAL DATA:  Status post fall today. EXAM: CT HEAD WITHOUT CONTRAST TECHNIQUE: Contiguous axial images were obtained from the base of the skull through the vertex without intravenous contrast. COMPARISON:  Head CT scan 10/27/2015. FINDINGS: Brain: No evidence of acute infarction, hemorrhage, hydrocephalus, extra-axial collection or mass lesion/mass effect. Vascular: No hyperdense vessel or unexpected calcification. Skull: Intact.  No focal lesion. Sinuses/Orbits: Negative. Other: None. IMPRESSION: Negative head CT. Electronically Signed   By: Inge Rise M.D.   On: 02/05/2019 23:09   US Renal  Result Date: 02/11/2019 CLINICAL DATA:  Hydronephrosis EXAM: RENAL / URINARY TRACT ULTRASOUND COMPLETE COMPARISON:  CT abdomen 01/05/2016 FINDINGS: Right Kidney: Renal measurements: 12.4 x 6.4 x 6.7 cm = volume: 279 mL . Echogenicity within normal limits. No mass or hydronephrosis visualized. Left Kidney: Renal measurements: 11.9 x 6.4 x 4.5 cm = volume: 182 mL. Echogenicity within normal limits. No mass or hydronephrosis visualized. Bladder: Appears normal for degree of bladder distention. Other: 5.7 x 5.9 x 5.3 cm cystic left adnexal mass likely reflecting an ovarian cyst. IMPRESSION: 1. No obstructive uropathy. 2. 5.7 x 5.9 x 5.3 cm cystic left adnexal mass likely reflecting an ovarian cyst. Electronically Signed   By:  Kathreen Devoid   On: 02/11/2019 15:59   Dg Chest Port 1 View  Result Date: 02/09/2019 CLINICAL DATA:  Shortness of breath, recent surgery EXAM: PORTABLE CHEST 1 VIEW COMPARISON:  02/08/2019 FINDINGS: No change in low volume  AP portable examination with probable bibasilar scarring and/or atelectasis. No new or focal airspace opacity. IMPRESSION: No change in low volume AP portable examination with probable bibasilar scarring and/or atelectasis. No new or focal airspace opacity. Electronically Signed   By: Eddie Candle M.D.   On: 02/09/2019 15:28   Dg Chest Port 1 View  Result Date: 02/08/2019 CLINICAL DATA:  73 year old female with a history of cough EXAM: PORTABLE CHEST 1 VIEW COMPARISON:  02/05/2019 FINDINGS: Cardiomediastinal silhouette unchanged in size and contour. Low lung volumes accentuates the interstitium and the vasculature. No interlobular septal thickening. Linear opacities at the lung bases. No pneumothorax. Coarsened interstitial markings throughout. No confluent airspace disease. IMPRESSION: Low lung volumes with likely basilar atelectasis and a background of chronic changes. Electronically Signed   By: Corrie Mckusick D.O.   On: 02/08/2019 13:14   Dg Chest Portable 1 View  Result Date: 02/05/2019 CLINICAL DATA:  Pain following fall EXAM: PORTABLE CHEST 1 VIEW COMPARISON:  December 04, 2015 FINDINGS: There is no edema or consolidation. The heart size and pulmonary vascularity are normal. No adenopathy. There is aortic atherosclerosis. No evident pneumothorax. Bones are osteoporotic. There old healed rib fractures on the left. No acute fracture evident. IMPRESSION: No edema or consolidation. Stable cardiac silhouette. Bones osteoporotic with old healed rib fractures on the left. Aortic Atherosclerosis (ICD10-I70.0). Electronically Signed   By: Lowella Grip III M.D.   On: 02/05/2019 21:58   Dg Hip Operative Unilat W Or W/o Pelvis Right  Result Date: 02/07/2019 CLINICAL DATA:  Intramedullary nail placement for proximal femur fracture EXAM: OPERATIVE RIGHT HIP/femur: 2  VIEWS TECHNIQUE: Fluoroscopic spot image(s) were submitted for interpretation post-operatively. FLUOROSCOPY TIME:  1 MINUTES 37 SECONDS; 4  ACQUIRED IMAGES COMPARISON:  Pelvis and right hip radiographs Feb 05, 2019 FINDINGS: Frontal and lateral views were obtained. There is screw and rod fixation through a comminuted fracture of the intertrochanteric and proximal subtrochanteric regions of the right proximal femur. Alignment at the fracture site is near anatomic. Tip of the screw is in the proximal femoral head. The rod more distally is fixated 2 surgical screws. No dislocation. IMPRESSION: Surgical fixation through comminuted proximal femur fracture with alignment at the fracture site near anatomic. Tip of screw in proximal femoral head. No dislocation. Electronically Signed   By: Lowella Grip III M.D.   On: 02/07/2019 11:42   Dg Hip Unilat W Or Wo Pelvis 2-3 Views Right  Result Date: 02/05/2019 CLINICAL DATA:  Fall with hip pain. EXAM: DG HIP (WITH OR WITHOUT PELVIS) 2-3V RIGHT COMPARISON:  Abdominal radiographs 12/09/2015 FINDINGS: There is an acute comminuted intertrochanteric right femur fracture which is mildly displaced. The femoral head remains approximated with the acetabulum. Moderate right and mild left hip osteoarthrosis is again seen. IMPRESSION: Acute intertrochanteric right femur fracture. Electronically Signed   By: Logan Bores M.D.   On: 02/05/2019 21:57   Dg Femur [place], [place] 2 Views Right  Result Date: 02/07/2019 CLINICAL DATA:  Postop intramedullary nail. EXAM: RIGHT FEMUR PORTABLE 2 VIEW COMPARISON:  02/05/2019. FINDINGS: An intramedullary nail with a compression lag screw has been placed across the intertrochanteric fracture. Improved position and alignment. IMPRESSION: No adverse features. Electronically Signed   By: Roderic Ovens.D.  On: 02/07/2019 12:52       Today   Subjective:   [name] patient complains of some constipation Objective:   Blood pressure (!) 153/61, pulse 88, temperature 98.4 F (36.9 C), temperature source Oral, resp. rate 20, height 5\' 6"  (1.676 m), weight 117.9 kg, SpO2 93 %.   .  Intake/Output Summary (Last 24 hours) at 02/12/2019 0940 Last data filed at 02/12/2019 0605 Gross per 24 hour  Intake 240 ml  Output 1050 ml  Net -810 ml    Exam VITAL SIGNS: Blood pressure (!) 153/61, pulse 88, temperature 98.4 F (36.9 C), temperature source Oral, resp. rate 20, height 5\' 6"  (1.676 m), weight 117.9 kg, SpO2 93 %.  GENERAL:  73 y.o.-year-old patient lying in the bed with no acute distress.  EYES: Pupils equal, round, reactive to light and accommodation. No scleral icterus. Extraocular muscles intact.  HEENT: Head atraumatic, normocephalic. Oropharynx and nasopharynx clear.  NECK:  Supple, no jugular venous distention. No thyroid enlargement, no tenderness.  LUNGS: Normal breath sounds bilaterally, no wheezing, rales,rhonchi or crepitation. No use of accessory muscles of respiration.  CARDIOVASCULAR: S1, S2 normal. No murmurs, rubs, or gallops.  ABDOMEN: Soft, nontender, nondistended. Bowel sounds present. No organomegaly or mass.  EXTREMITIES: No pedal edema, cyanosis, or clubbing.  NEUROLOGIC: Cranial nerves II through XII are intact. Muscle strength 5/5 in all extremities. Sensation intact. Gait not checked.  PSYCHIATRIC: The patient is alert and oriented x 3.  SKIN: No obvious rash, lesion, or ulcer.   Data Review     CBC w Diff:  Lab Results  Component Value Date   WBC 33.6 (H) 02/12/2019   HGB 11.3 (L) 02/12/2019   HCT 35.2 (L) 02/12/2019   PLT 348 02/12/2019   LYMPHOPCT 11 02/11/2019   BANDSPCT 4 12/09/2015   MONOPCT 6 02/11/2019   EOSPCT 4 02/11/2019   BASOPCT 0 02/11/2019   CMP:  Lab Results  Component Value Date   NA 140 02/12/2019   K 3.7 02/12/2019   CL 103 02/12/2019   CO2 29 02/12/2019   BUN 17 02/12/2019   CREATININE 0.58 02/12/2019   PROT 8.0 02/05/2019   ALBUMIN 3.5 02/05/2019   BILITOT 1.2 02/05/2019   ALKPHOS 94 02/05/2019   AST 68 (H) 02/05/2019   ALT 42 02/05/2019  .  Micro Results Recent Results (from the past 240  hour(s))  SARS Coronavirus 2 (CEPHEID - Performed in [place] hospital lab), Hosp Order     Status: None   Collection Time: 02/05/19  9:21 PM  Result Value Ref Range Status   SARS Coronavirus 2 NEGATIVE NEGATIVE Final    Comment: (NOTE) If result is NEGATIVE SARS-CoV-2 target nucleic acids are NOT DETECTED. The SARS-CoV-2 RNA is generally detectable in upper and lower  respiratory specimens during the acute phase of infection. The lowest  concentration of SARS-CoV-2 viral copies this assay can detect is 250  copies / mL. A negative result does not preclude SARS-CoV-2 infection  and should not be used as the sole basis for treatment or other  patient management decisions.  A negative result may occur with  improper specimen collection / handling, submission of specimen other  than nasopharyngeal swab, presence of viral mutation(s) within the  areas targeted by this assay, and inadequate number of viral copies  (<250 copies / mL). A negative result must be combined with clinical  observations, patient history, and epidemiological information. If result is POSITIVE SARS-CoV-2 target nucleic acids are DETECTED. The  SARS-CoV-2 RNA is generally detectable in upper and lower  respiratory specimens dur ing the acute phase of infection.  Positive  results are indicative of active infection with SARS-CoV-2.  Clinical  correlation with patient history and other diagnostic information is  necessary to determine patient infection status.  Positive results do  not rule out bacterial infection or co-infection with other viruses. If result is PRESUMPTIVE POSTIVE SARS-CoV-2 nucleic acids MAY BE PRESENT.   A presumptive positive result was obtained on the submitted specimen  and confirmed on repeat testing.  While 2019 novel coronavirus  (SARS-CoV-2) nucleic acids may be present in the submitted sample  additional confirmatory testing may be necessary for epidemiological  and / or clinical  management purposes  to differentiate between  SARS-CoV-2 and other Sarbecovirus currently known to infect humans.  If clinically indicated additional testing with an alternate test  methodology [phone]) is advised. The SARS-CoV-2 RNA is generally  detectable in upper and lower respiratory sp ecimens during the acute  phase of infection. The expected result is Negative. Fact Sheet for Patients:  StrictlyIdeas.no Fact Sheet for Healthcare Providers: BankingDealers.co.za This test is not yet approved or cleared by the [place] FDA and has been authorized for detection and/or diagnosis of SARS-CoV-2 by FDA under an Emergency Use Authorization (EUA).  This EUA will remain in effect (meaning this test can be used) for the duration of the COVID-19 declaration under Section 564(b)(1) of the Act, 21 U.S.C. section 360bbb-3(b)(1), unless the authorization is terminated or revoked sooner. Performed at [facility], [address] [place], [place] [address]   Urine culture     Status: Abnormal   Collection Time: 02/05/19  9:22 PM  Result Value Ref Range Status   Specimen Description   Final    URINE, RANDOM Performed at [facility], [address] [place], [place] [address]    Special Requests   Final    NONE Performed at [facility], [address] [place], [place] [address]    Culture >=100,000 COLONIES/mL PSEUDOMONAS AERUGINOSA (A)  Final   Report Status 02/09/2019 FINAL  Final   Organism ID, Bacteria PSEUDOMONAS AERUGINOSA (A)  Final      Susceptibility   Pseudomonas aeruginosa - MIC*    CEFTAZIDIME 4 SENSITIVE Sensitive     CIPROFLOXACIN <=0.25 SENSITIVE Sensitive     GENTAMICIN <=1 SENSITIVE Sensitive     IMIPENEM 2 SENSITIVE Sensitive     PIP/TAZO <=4 SENSITIVE Sensitive     CEFEPIME <=1 SENSITIVE Sensitive     * >=100,000 COLONIES/mL PSEUDOMONAS AERUGINOSA  Blood culture (routine x 2)      Status: None   Collection Time: 02/05/19 10:41 PM  Result Value Ref Range Status   Specimen Description BLOOD BLOOD RIGHT HAND  Final   Special Requests   Final    BOTTLES DRAWN AEROBIC AND ANAEROBIC Blood Culture results may not be optimal due to an excessive volume of blood received in culture bottles   Culture   Final    NO GROWTH 5 DAYS Performed at [facility], [address] [place], [place] [address]    Report Status 02/10/2019 FINAL  Final  Blood culture (routine x 2)     Status: None   Collection Time: 02/05/19 10:41 PM  Result Value Ref Range Status   Specimen Description BLOOD LEFT ANTECUBITAL  Final   Special Requests   Final    BOTTLES DRAWN AEROBIC AND ANAEROBIC Blood Culture adequate volume  Culture   Final    NO GROWTH 5 DAYS Performed at [facility], [address] [place], [place] [address]    Report Status 02/10/2019 FINAL  Final  MRSA PCR Screening     Status: Abnormal   Collection Time: 02/06/19  1:14 AM  Result Value Ref Range Status   MRSA by PCR POSITIVE (A) NEGATIVE Final    Comment:        The GeneXpert MRSA Assay (FDA approved for NASAL specimens only), is one component of a comprehensive MRSA colonization surveillance program. It is not intended to diagnose MRSA infection nor to guide or monitor treatment for MRSA infections. RESULT CALLED TO, READ BACK BY AND VERIFIED WITH:  VIVIAN ALI AT 0235 02/06/2019 SDR Performed at [facility], [address] [place], [place] [address]   Novel Coronavirus, NAA (hospital order; send-out to ref lab)     Status: None   Collection Time: 02/08/19  4:50 PM  Result Value Ref Range Status   SARS-CoV-2, NAA NOT DETECTED NOT DETECTED Final    Comment: (NOTE) This test was developed and its performance characteristics determined by [employer]. This test has not been FDA cleared or approved. This test has been authorized by FDA under an Emergency Use Authorization  (EUA). This test is only authorized for the duration of time the declaration that circumstances exist justifying the authorization of the emergency use of in vitro diagnostic tests for detection of SARS-CoV-2 virus and/or diagnosis of COVID-19 infection under section 564(b)(1) of the Act, 21 U.S.C. 185UDJ-4(H)(7), unless the authorization is terminated or revoked sooner. When diagnostic testing is negative, the possibility of a false negative result should be considered in the context of a patient's recent exposures and the presence of clinical signs and symptoms consistent with COVID-19. An individual without symptoms of COVID-19 and who is not shedding SARS-CoV-2 virus would expect to have a negative (not detected) result in this assay. Performed  At: [facility] [address] [place], [place] [id number] Rush Farmer MD FO:[id number]    [place]  Final    Comment: Performed at [facility], [address] [place], [place] [address]     Code Status History    Date Active Date Inactive Code Status Order ID Comments User Context   02/06/2019 0854 02/07/2019 1326 Full Code [id number]  Thornton Park, MD Inpatient   02/05/2019 2358 02/06/2019 0854 Full Code [id number]  Mansy, Arvella Merles, MD ED   03/03/2016 1736 03/05/2016 1609 Full Code [id number]  Algernon Huxley, MD Inpatient   12/04/2015 0100 12/10/2015 1956 Full Code [id number]  Lance Coon, MD Inpatient   10/31/2015 2213 11/09/2015 1803 Full Code [id number]  Colleen Can, MD Inpatient   10/30/2015 1510 10/31/2015 2213 DNR [id number]  Colleen Can, MD Inpatient   10/30/2015 1418 10/30/2015 1510 DNR [id number]  Colleen Can, MD Inpatient   10/20/2015 1714 10/30/2015 1418 Full Code [id number]  Fritzi Mandes, MD Inpatient           Contact information for follow-up providers    Thornton Park, MD In 2 days.   Specialty:  Orthopedic Surgery Why:  [place] TO CALL FOR 2 DAY  APPT. Contact information: [place] [address] [phone]            Contact information for after-discharge care    [facility]   Service:  Skilled Nursing Why:  PT NEEDS  [place]. [place] Contact information: [place] [phone]                  Discharge Medications   Allergies as of 02/12/2019      Reactions   Prednisone Anaphylaxis      Medication List    STOP taking these medications   traMADol 50 MG tablet Commonly known as:  ULTRAM     TAKE these medications   acetaminophen 500 MG tablet Commonly known as:  TYLENOL Take 1,000 mg by mouth every 8 (eight) hours as needed for moderate pain. Reported on 12/16/2015   aspirin 81 MG EC tablet Take 1 tablet (81 mg total) by mouth daily.   diclofenac sodium 1 % Gel Commonly known as:  VOLTAREN Apply 4 g topically 4 (four) times daily.   docusate sodium 100 MG capsule Commonly known as:  COLACE Take 1 capsule (100 mg total) by mouth 2 (two) times daily.   enoxaparin 40 MG/0.4ML injection Commonly known as:  LOVENOX Inject 0.4 mLs (40 mg total) into the skin every 12 (twelve) hours for 28 days.   gabapentin 300 MG capsule Commonly known as:  NEURONTIN Take 300 mg by mouth at bedtime.   insulin glargine 100 UNIT/ML injection Commonly known as:  LANTUS Inject 0.15 mLs (15 Units total) into the skin daily.   meloxicam 15 MG tablet Commonly known as:  MOBIC Take 15 mg by mouth daily.   metFORMIN 500 MG tablet Commonly known as:  GLUCOPHAGE Take 500 mg by mouth 2 (two) times daily.   metoprolol tartrate 25 MG tablet Commonly known as:  LOPRESSOR Take 1 tablet (25 mg total) by mouth 2 (two) times daily.   oxyCODONE 5 MG immediate release tablet Commonly known as:  Oxy IR/ROXICODONE Take 1 tablet (5 mg total) by mouth every 6 (six) hours as needed for moderate pain or severe  pain.   polyethylene glycol 17 g packet Commonly known as:  MIRALAX / GLYCOLAX Take 17 g by mouth daily.   pravastatin 20 MG tablet Commonly known as:  PRAVACHOL   tamsulosin 0.4 MG Caps capsule Commonly known as:  FLOMAX Take 1 capsule (0.4 mg total) by mouth daily.   traZODone 50 MG tablet Commonly known as:  DESYREL Take 50 mg by mouth at bedtime.          Total Time in preparing paper work, data evaluation and todays exam - 53 minutes  Dustin Flock M.D on 02/12/2019 at 9:40 AM [place]  [phone]

## 2019-02-12 NOTE — Progress Notes (Signed)
Called report to [place] at [employer]. Answered all questions.

## 2019-02-12 NOTE — TOC Transition Note (Signed)
Transition of Care [facility]) - CM/SW Discharge Note   Patient Details  Name: April [name] MRN: [id number] Date of Birth: 01-May-1946  [facility] of [facility]) CM/SW Contact:  Su Hilt, RN Phone Number: 02/12/2019, 8:54 AM   Clinical Narrative:     Patient to dc today [facility] room 33 B, via EMS transport, Bedside nurse to call for transport, Bedside nurse to call report to [phone] when ready, DC packet on the chart, the patient will notify her friend that she will DC, she is able to fill out her own admission papers  Final next level of care: Skilled Nursing Facility([facility] room 33 B) Barriers to Discharge: Barriers Resolved   Patient Goals and CMS Choice Patient states their goals for this hospitalization and ongoing recovery are:: go to short term rehab CMS Medicare.gov Compare Post Acute Care list provided to:: Patient Choice offered to / list presented to : Patient  Discharge Placement                       Discharge Plan and Services   Discharge Planning Services: CM Consult Post Acute Care Choice: [place]                               Social Determinants of Health (SDOH) Interventions     Readmission Risk Interventions No flowsheet data found.

## 2019-02-13 LAB — COMP PANEL: LEUKEMIA/LYMPHOMA

## 2019-02-20 LAB — BCR-ABL1 FISH
Cells Analyzed: 200
Cells Counted: 200

## 2019-04-05 ENCOUNTER — Encounter (INDEPENDENT_AMBULATORY_CARE_PROVIDER_SITE_OTHER): Payer: Medicare HMO

## 2019-04-05 ENCOUNTER — Ambulatory Visit (INDEPENDENT_AMBULATORY_CARE_PROVIDER_SITE_OTHER): Payer: Medicare HMO | Admitting: Vascular Surgery

## 2019-04-16 ENCOUNTER — Encounter (INDEPENDENT_AMBULATORY_CARE_PROVIDER_SITE_OTHER): Payer: Medicare HMO

## 2019-04-16 ENCOUNTER — Ambulatory Visit (INDEPENDENT_AMBULATORY_CARE_PROVIDER_SITE_OTHER): Payer: Medicare HMO | Admitting: Vascular Surgery

## 2019-07-02 ENCOUNTER — Other Ambulatory Visit: Payer: Self-pay

## 2019-07-02 ENCOUNTER — Ambulatory Visit (INDEPENDENT_AMBULATORY_CARE_PROVIDER_SITE_OTHER): Payer: Medicare HMO

## 2019-07-02 ENCOUNTER — Encounter (INDEPENDENT_AMBULATORY_CARE_PROVIDER_SITE_OTHER): Payer: Self-pay | Admitting: Vascular Surgery

## 2019-07-02 ENCOUNTER — Ambulatory Visit (INDEPENDENT_AMBULATORY_CARE_PROVIDER_SITE_OTHER): Payer: Medicare HMO | Admitting: Vascular Surgery

## 2019-07-02 VITALS — BP 127/78 | HR 71 | Resp 16

## 2019-07-02 DIAGNOSIS — I96 Gangrene, not elsewhere classified: Secondary | ICD-10-CM

## 2019-07-02 DIAGNOSIS — E119 Type 2 diabetes mellitus without complications: Secondary | ICD-10-CM

## 2019-07-02 DIAGNOSIS — M7989 Other specified soft tissue disorders: Secondary | ICD-10-CM

## 2019-07-02 DIAGNOSIS — I890 Lymphedema, not elsewhere classified: Secondary | ICD-10-CM | POA: Diagnosis not present

## 2019-07-02 DIAGNOSIS — I739 Peripheral vascular disease, unspecified: Secondary | ICD-10-CM | POA: Diagnosis not present

## 2019-07-02 NOTE — Patient Instructions (Signed)

## 2019-07-02 NOTE — Assessment & Plan Note (Signed)
Perfusion is normal today.  Wounds are well-healed.  Recheck in 1 year.

## 2019-07-02 NOTE — Assessment & Plan Note (Signed)
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  She is trying to elevate her legs and her activity is poor but improving.  We discussed the use of a lymphedema pump as an adjuvant therapy in addition to daily use of compression stockings, leg elevation, and activity.  This would be an excellent adjuvant therapy we will try to get that approved today.  We will see her back in 3 to 4 months in follow-up.

## 2019-07-02 NOTE — Progress Notes (Signed)
MRN : [id number]  April [name] is a 73 y.o. (1945/10/26) female who presents with chief complaint of  Chief Complaint  Patient presents with  . Follow-up    ultrasound follow up  .  History of Present Illness: Patient returns today in follow up of leg pain and swelling.  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  Her ABIs today remain in the normal range with good waveforms distally.  She is having a lot more trouble with swelling in her legs.  She broke her hip several months ago and has had decreased mobility since that time.  Both legs are swelling but the right leg, which is the side of the hip she broke, is swelling more.  She denies ulceration or infection.  No fever or chills.  Current Outpatient Medications  Medication Sig Dispense Refill  . acetaminophen (TYLENOL) 500 MG tablet Take 1,000 mg by mouth every 8 (eight) hours as needed for moderate pain. Reported on 12/16/2015    . aspirin EC 81 MG EC tablet Take 1 tablet (81 mg total) by mouth daily. 90 tablet 3  . diclofenac sodium (VOLTAREN) 1 % GEL Apply 4 g topically 4 (four) times daily.    Marland Kitchen docusate sodium (COLACE) 100 MG capsule Take 1 capsule (100 mg total) by mouth 2 (two) times daily. 10 capsule 0  . gabapentin (NEURONTIN) 300 MG capsule Take 300 mg by mouth at bedtime.    . insulin glargine (LANTUS) 100 UNIT/ML injection Inject 0.15 mLs (15 Units total) into the skin daily. 10 mL 11  . meloxicam (MOBIC) 15 MG tablet Take 15 mg by mouth daily.    . metFORMIN (GLUCOPHAGE) 500 MG tablet Take 500 mg by mouth 2 (two) times daily.     . metoprolol tartrate (LOPRESSOR) 25 MG tablet Take 1 tablet (25 mg total) by mouth 2 (two) times daily.    Marland Kitchen oxyCODONE (OXY IR/ROXICODONE) 5 MG immediate release tablet Take 1 tablet (5 mg total) by mouth every 6 (six) hours as needed for moderate pain or severe pain. 30 tablet 0  .  polyethylene glycol (MIRALAX / GLYCOLAX) packet Take 17 g by mouth daily. 14 each 0  . pravastatin (PRAVACHOL) 20 MG tablet     . tamsulosin (FLOMAX) 0.4 MG CAPS capsule Take 1 capsule (0.4 mg total) by mouth daily. 30 capsule   . traMADol (ULTRAM) 50 MG tablet     . traZODone (DESYREL) 50 MG tablet Take 50 mg by mouth at bedtime.    . enoxaparin (LOVENOX) 40 MG/0.4ML injection Inject 0.4 mLs (40 mg total) into the skin every 12 (twelve) hours for 28 days.     No current facility-administered medications for this visit.     Past Medical History:  Diagnosis Date  . Acute renal failure (ARF) ([place])   . Difficult intubation   . Hypertension   . NSTEMI (non-ST elevated myocardial infarction) ([place])   . Paroxysmal atrial fibrillation (HCC)   . Renal disorder   . Sepsis [facility]) January 2017   [facility]  . Type 2 diabetes mellitus ([place])     Past Surgical History:  Procedure Laterality Date  . CYSTOSCOPY WITH STENT PLACEMENT Right 11/03/2015   Procedure: CYSTOSCOPY WITH STENT PLACEMENT;  Surgeon: Hollice Espy, MD;  Location: [facility] ORS;  Service: Urology;  Laterality: Right;  . FRACTURE SURGERY Right    [place]  . INTRAMEDULLARY (IM)  NAIL INTERTROCHANTERIC Right 02/07/2019   Procedure: INTRAMEDULLARY (IM) NAIL INTERTROCHANTRIC;  Surgeon: Thornton Park, MD;  Location: [facility] ORS;  Service: Orthopedics;  Laterality: Right;  . TONSILLECTOMY    . 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   Procedure: TRANSMETATARSAL AMPUTATION;  Surgeon: Algernon Huxley, MD;  Location: [facility] ORS;  Service: Vascular;  Laterality: Bilateral;    Social History Social History   Tobacco Use  . Smoking status: Never Smoker  . Smokeless tobacco: Never Used  Substance Use Topics  . Alcohol use: No    Alcohol/week: 0.0 standard drinks  . Drug use: No     Family History Family History  Problem Relation Age of Onset  . Diabetes Father   . Heart disease Father   . Stroke Maternal Grandmother     Allergies   Allergen Reactions  . Prednisone Anaphylaxis  . Other      REVIEW OF SYSTEMS(Negative unless checked)  Constitutional: [] ?Weight loss[] ?Fever[] ?Chills Cardiac:[] ?Chest pain[] ?Chest pressure[] ?Palpitations [] ?Shortness of breath when laying flat [] ?Shortness of breath at rest [] ?Shortness of breath with exertion. Vascular: [] ?Pain in legs with walking[] ?Pain in legsat rest[] ?Pain in legs when laying flat [] ?Claudication [] ?Pain in feet when walking [] ?Pain in feet at rest [] ?Pain in feet when laying flat [] ?History of DVT [] ?Phlebitis [x] ?Swelling in legs [] ?Varicose veins [] ?Non-healing ulcers Pulmonary: [] ?Uses home oxygen [] ?Productive cough[] ?Hemoptysis [] ?Wheeze [] ?COPD [] ?Asthma Neurologic: [] ?Dizziness [] ?Blackouts [] ?Seizures [] ?History of stroke [] ?History of TIA[] ?Aphasia [] ?Temporary blindness[] ?Dysphagia [] ?Weaknessor numbness in arms [] ?Weakness or numbnessin legs Musculoskeletal: [] ?Arthritis [] ?Joint swelling [] ?Joint pain [] ?Low back pain Hematologic:[] ?Easy bruising[] ?Easy bleeding [] ?Hypercoagulable state [] ?Anemic  Gastrointestinal:[] ?Blood in stool[] ?Vomiting blood[] ?Gastroesophageal reflux/heartburn[] ?Abdominal pain Genitourinary: [] ?Chronic kidney disease [] ?Difficulturination [] ?Frequenturination [] ?Burning with urination[] ?Hematuria Skin: [] ?Rashes [x] ?Ulcers [x] ?Wounds Psychological: [x] ?History of anxiety[] ?History of major depression.   Physical Examination  BP 127/78 (BP Location: Right Arm)   Pulse 71   Resp 16  Gen:  WD/WN, NAD Head: [place]/AT, No temporalis wasting. Ear/Nose/Throat: Hearing grossly intact, nares w/o erythema or drainage Eyes: Conjunctiva clear. Sclera non-icteric Neck: Supple.  Trachea midline Pulmonary:  Good air movement, no use of accessory muscles.  Cardiac: RRR, no JVD Vascular:  Vessel Right Left  Radial  Palpable Palpable                          PT 1+ Palpable 1+ Palpable  DP Palpable Palpable    Musculoskeletal: M/S 5/5 throughout.  No deformity or atrophy. In a wheelchair. 1-2+ RLE edema, 1+ LLE edema. Neurologic: Sensation grossly intact in extremities.  Symmetrical.  Speech is fluent.  Psychiatric: Judgment intact, Mood & affect appropriate for pt's clinical situation. Dermatologic: No rashes or ulcers noted.  No cellulitis or open wounds.       Labs No results found for this or any previous visit (from the past 2160 hour(s)).  Radiology No results found.  Assessment/Plan Type 2 diabetes mellitus (HCC) blood glucose control important in reducing the progression of atherosclerotic disease. Also, involved in wound healing. On appropriate medications.  Swelling of limb Compression and elevation recommended. Worsened with her hip fracture.  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  She is trying to elevate her legs and her activity is poor but improving.  We discussed the use of a lymphedema pump as an adjuvant therapy in addition to daily use of compression stockings, leg elevation, and activity.  This would be an excellent adjuvant therapy we will try to get that approved  today.  We will see her back in 3 to 4 months in follow-up.  PAD (peripheral artery disease) (HCC) Perfusion is normal today.  Wounds are well-healed.  Recheck in 1 year.    Leotis Pain, MD  07/02/2019 3:33 PM    This note was created with Dragon medical transcription system.  Any errors from dictation are purely unintentional

## 2019-10-08 ENCOUNTER — Ambulatory Visit (INDEPENDENT_AMBULATORY_CARE_PROVIDER_SITE_OTHER): Payer: Medicare HMO | Admitting: Vascular Surgery

## 2019-10-08 ENCOUNTER — Other Ambulatory Visit: Payer: Self-pay

## 2019-10-08 ENCOUNTER — Encounter (INDEPENDENT_AMBULATORY_CARE_PROVIDER_SITE_OTHER): Payer: Self-pay | Admitting: Vascular Surgery

## 2019-10-08 VITALS — BP 145/76 | HR 84 | Resp 16 | Wt 255.0 lb

## 2019-10-08 DIAGNOSIS — I890 Lymphedema, not elsewhere classified: Secondary | ICD-10-CM | POA: Diagnosis not present

## 2019-10-08 DIAGNOSIS — I739 Peripheral vascular disease, unspecified: Secondary | ICD-10-CM

## 2019-10-08 DIAGNOSIS — E119 Type 2 diabetes mellitus without complications: Secondary | ICD-10-CM

## 2019-10-08 DIAGNOSIS — M7989 Other specified soft tissue disorders: Secondary | ICD-10-CM | POA: Diagnosis not present

## 2019-10-08 DIAGNOSIS — I10 Essential (primary) hypertension: Secondary | ICD-10-CM | POA: Diagnosis not present

## 2019-10-08 NOTE — Progress Notes (Signed)
MRN : [id number]  April [name] is a 74 y.o. ([birth year]) female who presents with chief complaint of  Chief Complaint  Patient presents with  . Follow-up    3-87month follow up  .  History of Present Illness: Patient returns today in follow up of her lower extremity vascular disease.  Her leg swelling is much better.  She has been wearing some stockings and doing a better job of elevating her legs but probably most importantly her hip fracture repair has healed well and she is doing a lot more walking.  She still has some lower extremity swelling but is fairly mild at this point.  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  She takes Neurontin as well as tramadol for that intermittently no new ulceration or infection  Current Outpatient Medications  Medication Sig Dispense Refill  . acetaminophen (TYLENOL) 500 MG tablet Take 1,000 mg by mouth every 8 (eight) hours as needed for moderate pain. Reported on 12/16/2015    . aspirin EC 81 MG EC tablet Take 1 tablet (81 mg total) by mouth daily. 90 tablet 3  . docusate sodium (COLACE) 100 MG capsule Take 1 capsule (100 mg total) by mouth 2 (two) times daily. 10 capsule 0  . gabapentin (NEURONTIN) 300 MG capsule Take 300 mg by mouth at bedtime.    . meloxicam (MOBIC) 15 MG tablet Take 15 mg by mouth as needed.     . metFORMIN (GLUCOPHAGE) 500 MG tablet Take 500 mg by mouth 2 (two) times daily.     . metoprolol tartrate (LOPRESSOR) 25 MG tablet Take 1 tablet (25 mg total) by mouth 2 (two) times daily.    . polyethylene glycol (MIRALAX / GLYCOLAX) packet Take 17 g by mouth daily. (Patient taking differently: Take 17 g by mouth daily as needed. ) 14 each 0  . pravastatin (PRAVACHOL) 20 MG tablet     . traMADol (ULTRAM) 50 MG tablet     . diclofenac sodium (VOLTAREN) 1 % GEL Apply 4 g topically 4 (four) times daily.    Marland Kitchen enoxaparin (LOVENOX) 40 MG/0.4ML injection Inject 0.4 mLs (40 mg total) into the skin every 12 (twelve) hours  for 28 days.    . insulin glargine (LANTUS) 100 UNIT/ML injection Inject 0.15 mLs (15 Units total) into the skin daily. (Patient not taking: Reported on 10/08/2019) 10 mL 11  . oxyCODONE (OXY IR/ROXICODONE) 5 MG immediate release tablet Take 1 tablet (5 mg total) by mouth every 6 (six) hours as needed for moderate pain or severe pain. (Patient not taking: Reported on 10/08/2019) 30 tablet 0  . tamsulosin (FLOMAX) 0.4 MG CAPS capsule Take 1 capsule (0.4 mg total) by mouth daily. (Patient not taking: Reported on 10/08/2019) 30 capsule   . traZODone (DESYREL) 50 MG tablet Take 50 mg by mouth at bedtime.     No current facility-administered medications for this visit.    Past Medical History:  Diagnosis Date  . Acute renal failure (ARF) ([place])   . Difficult intubation   . Hypertension   . NSTEMI (non-ST elevated myocardial infarction) ([place])   . Paroxysmal atrial fibrillation (HCC)   . Renal disorder   . Sepsis [facility]) January 2017   [facility]  . Type 2 diabetes mellitus ([place])     Past Surgical History:  Procedure Laterality Date  . CYSTOSCOPY WITH STENT PLACEMENT Right 11/03/2015   Procedure: CYSTOSCOPY WITH STENT PLACEMENT;  Surgeon: Hollice Espy, MD;  Location: [facility] ORS;  Service: Urology;  Laterality: Right;  . FRACTURE SURGERY Right    [place]  . INTRAMEDULLARY (IM) NAIL INTERTROCHANTERIC Right 02/07/2019   Procedure: INTRAMEDULLARY (IM) NAIL INTERTROCHANTRIC;  Surgeon: Thornton Park, MD;  Location: [facility] ORS;  Service: Orthopedics;  Laterality: Right;  . TONSILLECTOMY    . 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   Procedure: TRANSMETATARSAL AMPUTATION;  Surgeon: Algernon Huxley, MD;  Location: [facility] ORS;  Service: Vascular;  Laterality: Bilateral;    Social History        Tobacco Use  . Smoking status: Never Smoker  . Smokeless tobacco: Never Used  Substance Use Topics  . Alcohol use: No    Alcohol/week: 0.0 standard drinks  . Drug use: No     Family History      Family  History  Problem Relation Age of Onset  . Diabetes Father   . Heart disease Father   . Stroke Maternal Grandmother         Allergies  Allergen Reactions  . Prednisone Anaphylaxis  . Other      REVIEW OF SYSTEMS(Negative unless checked)  Constitutional: [] ??Weight loss[] ??Fever[] ??Chills Cardiac:[] ??Chest pain[] ??Chest pressure[] ??Palpitations [] ??Shortness of breath when laying flat [] ??Shortness of breath at rest [] ??Shortness of breath with exertion. Vascular: [] ??Pain in legs with walking[] ??Pain in legsat rest[] ??Pain in legs when laying flat [] ??Claudication [] ??Pain in feet when walking [] ??Pain in feet at rest [] ??Pain in feet when laying flat [] ??History of DVT [] ??Phlebitis [x] ??Swelling in legs [] ??Varicose veins [] ??Non-healing ulcers Pulmonary: [] ??Uses home oxygen [] ??Productive cough[] ??Hemoptysis [] ??Wheeze [] ??COPD [] ??Asthma Neurologic: [] ??Dizziness [] ??Blackouts [] ??Seizures [] ??History of stroke [] ??History of TIA[] ??Aphasia [] ??Temporary blindness[] ??Dysphagia [] ??Weaknessor numbness in arms [] ??Weakness or numbnessin legs Musculoskeletal: [] ??Arthritis [] ??Joint swelling [] ??Joint pain [] ??Low back pain Hematologic:[] ??Easy bruising[] ??Easy bleeding [] ??Hypercoagulable state [] ??Anemic  Gastrointestinal:[] ??Blood in stool[] ??Vomiting blood[] ??Gastroesophageal reflux/heartburn[] ??Abdominal pain Genitourinary: [] ??Chronic kidney disease [] ??Difficulturination [] ??Frequenturination [] ??Burning with urination[] ??Hematuria Skin: [] ??Rashes [x] ??Ulcers [x] ??Wounds Psychological: [x] ??History of anxiety[] ??History of major depression.    Physical Examination  BP (!) 145/76 (BP Location: Left Arm)   Pulse 84   Resp 16   Wt 255 lb (115.7 kg)   BMI 41.16 kg/m  Gen:  WD/WN, NAD Head: [place]/AT, No temporalis wasting. Ear/Nose/Throat:  Hearing grossly intact, nares w/o erythema or drainage Eyes: Conjunctiva clear. Sclera non-icteric Neck: Supple.  Trachea midline Pulmonary:  Good air movement, no use of accessory muscles.  Cardiac: RRR, no JVD Vascular:  Vessel Right Left  Radial Palpable Palpable                          PT Palpable Palpable  DP Palpable Palpable   Gastrointestinal: soft, non-tender/non-distended. No guarding/reflex.  Musculoskeletal: M/S 5/5 throughout.  No deformity or atrophy.  1+ bilateral lower extremity edema. Neurologic: Sensation grossly intact in extremities.  Symmetrical.  Speech is fluent.  Psychiatric: Judgment intact, Mood & affect appropriate for pt's clinical situation. Dermatologic: No rashes or ulcers noted.  No cellulitis or open wounds.       Labs No results found for this or any previous visit (from the past 2160 hour(s)).  Radiology No results found.  Assessment/Plan Type 2 diabetes mellitus (HCC) blood glucose control important in reducing the progression of atherosclerotic disease. Also, involved in wound healing. On appropriate medications.  Swelling of limb Compression and elevation recommended. Worsened with her hip fracture.  Better now that her activity and ambulation are improved.  Still not resolved.  We will hold on a lymphedema pump for now as long as she is  doing reasonably well with conservative therapies.  We can recheck this at her next visit when she comes back to evaluate her PAD later in the year  Essential hypertension blood pressure control important in reducing the progression of atherosclerotic disease. On appropriate oral medications.   PAD (peripheral artery disease) (HCC) Healed transmetatarsal amputations.  To have this checked later in the year.    Leotis Pain, MD  10/08/2019 4:35 PM    This note was created with Dragon medical transcription system.  Any errors from dictation are purely unintentional

## 2019-10-08 NOTE — Patient Instructions (Signed)

## 2019-10-08 NOTE — Assessment & Plan Note (Signed)
blood pressure control important in reducing the progression of atherosclerotic disease. On appropriate oral medications.  

## 2019-10-08 NOTE — Assessment & Plan Note (Signed)
Healed transmetatarsal amputations.  To have this checked later in the year.

## 2020-07-07 ENCOUNTER — Ambulatory Visit (INDEPENDENT_AMBULATORY_CARE_PROVIDER_SITE_OTHER): Payer: Medicare HMO

## 2020-07-07 ENCOUNTER — Encounter (INDEPENDENT_AMBULATORY_CARE_PROVIDER_SITE_OTHER): Payer: Self-pay | Admitting: Vascular Surgery

## 2020-07-07 ENCOUNTER — Ambulatory Visit (INDEPENDENT_AMBULATORY_CARE_PROVIDER_SITE_OTHER): Payer: Medicare HMO | Admitting: Vascular Surgery

## 2020-07-07 ENCOUNTER — Other Ambulatory Visit: Payer: Self-pay

## 2020-07-07 VITALS — BP 145/82 | HR 75 | Ht 66.0 in | Wt 253.0 lb

## 2020-07-07 DIAGNOSIS — M7989 Other specified soft tissue disorders: Secondary | ICD-10-CM

## 2020-07-07 DIAGNOSIS — I739 Peripheral vascular disease, unspecified: Secondary | ICD-10-CM | POA: Diagnosis not present

## 2020-07-07 DIAGNOSIS — I10 Essential (primary) hypertension: Secondary | ICD-10-CM | POA: Diagnosis not present

## 2020-07-07 DIAGNOSIS — M79605 Pain in left leg: Secondary | ICD-10-CM

## 2020-07-07 DIAGNOSIS — E119 Type 2 diabetes mellitus without complications: Secondary | ICD-10-CM

## 2020-07-07 DIAGNOSIS — M79604 Pain in right leg: Secondary | ICD-10-CM

## 2020-07-07 NOTE — Assessment & Plan Note (Signed)
ABIs today are 1.16 on the right and 1.04 on the left with triphasic waveforms consistent with no arterial insufficiency.  Both transmetatarsal amputations remain well-healed.  Recheck in 1 year.

## 2020-07-07 NOTE — Assessment & Plan Note (Signed)
Does still have some phantom pain.  I given her prescription for Lyrica today as she has tried gabapentin with no improvement.

## 2020-07-07 NOTE — Progress Notes (Signed)
MRN : [id number]  April [name] is a 74 y.o. ([birth year]) female who presents with chief complaint of  Chief Complaint  Patient presents with  . Follow-up    U/S follow up  .  History of Present Illness: Patient returns today in follow up.  She is doing fairly well.  She is still having a fair bit of phantom pain and has tried Neurontin without any significant improvement.  The patient reports no new ulcerations or infections.  She has had a broken hip and her mobility is gone down which has resulted in some weight gain.  Her swelling is still fairly stable though. ABIs today are 1.16 on the right and 1.04 on the left with triphasic waveforms consistent with no arterial insufficiency.   Current Outpatient Medications  Medication Sig Dispense Refill  . acetaminophen (TYLENOL) 500 MG tablet Take 1,000 mg by mouth every 8 (eight) hours as needed for moderate pain. Reported on 12/16/2015    . aspirin EC 81 MG EC tablet Take 1 tablet (81 mg total) by mouth daily. 90 tablet 3  . diclofenac sodium (VOLTAREN) 1 % GEL Apply 4 g topically 4 (four) times daily.    Marland Kitchen docusate sodium (COLACE) 100 MG capsule Take 1 capsule (100 mg total) by mouth 2 (two) times daily. 10 capsule 0  . gabapentin (NEURONTIN) 300 MG capsule Take 300 mg by mouth at bedtime.    . insulin glargine (LANTUS) 100 UNIT/ML injection Inject 0.15 mLs (15 Units total) into the skin daily. 10 mL 11  . meloxicam (MOBIC) 15 MG tablet Take 15 mg by mouth as needed.     . metFORMIN (GLUCOPHAGE) 500 MG tablet Take 500 mg by mouth 2 (two) times daily.     . metoprolol tartrate (LOPRESSOR) 25 MG tablet Take 1 tablet (25 mg total) by mouth 2 (two) times daily.    Marland Kitchen oxyCODONE (OXY IR/ROXICODONE) 5 MG immediate release tablet Take 1 tablet (5 mg total) by mouth every 6 (six) hours as needed for moderate pain or severe pain. 30 tablet 0  . polyethylene glycol (MIRALAX / GLYCOLAX) packet Take 17 g by mouth daily. (Patient taking differently: Take  17 g by mouth daily as needed. ) 14 each 0  . pravastatin (PRAVACHOL) 20 MG tablet     . tamsulosin (FLOMAX) 0.4 MG CAPS capsule Take 1 capsule (0.4 mg total) by mouth daily. 30 capsule   . traMADol (ULTRAM) 50 MG tablet     . traZODone (DESYREL) 50 MG tablet Take 50 mg by mouth at bedtime.    . enoxaparin (LOVENOX) 40 MG/0.4ML injection Inject 0.4 mLs (40 mg total) into the skin every 12 (twelve) hours for 28 days.     No current facility-administered medications for this visit.    Past Medical History:  Diagnosis Date  . Acute renal failure (ARF) ([place])   . Difficult intubation   . Hypertension   . NSTEMI (non-ST elevated myocardial infarction) ([place])   . Paroxysmal atrial fibrillation (HCC)   . Renal disorder   . Sepsis [facility]) January 2017   [facility]  . Type 2 diabetes mellitus ([place])     Past Surgical History:  Procedure Laterality Date  . broken hip  2019  . CYSTOSCOPY WITH STENT PLACEMENT Right 11/03/2015   Procedure: CYSTOSCOPY WITH STENT PLACEMENT;  Surgeon: Hollice Espy, MD;  Location: [facility] ORS;  Service: Urology;  Laterality: Right;  . FRACTURE SURGERY Right    [place]  . INTRAMEDULLARY (IM)  NAIL INTERTROCHANTERIC Right 02/07/2019   Procedure: INTRAMEDULLARY (IM) NAIL INTERTROCHANTRIC;  Surgeon: Thornton Park, MD;  Location: [facility] ORS;  Service: Orthopedics;  Laterality: Right;  . TONSILLECTOMY    . 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   Procedure: TRANSMETATARSAL AMPUTATION;  Surgeon: Algernon Huxley, MD;  Location: [facility] ORS;  Service: Vascular;  Laterality: Bilateral;     Social History        Tobacco Use  . Smoking status: Never Smoker  . Smokeless tobacco: Never Used  Substance Use Topics  . Alcohol use: No    Alcohol/week: 0.0 standard drinks  . Drug use: No     Family History      Family History  Problem Relation Age of Onset  . Diabetes Father   . Heart disease Father   . Stroke Maternal Grandmother         Allergies    Allergen Reactions  . Prednisone Anaphylaxis  . Other      REVIEW OF SYSTEMS(Negative unless checked)  Constitutional: [] ???Weight loss[] ???Fever[] ???Chills Cardiac:[] ???Chest pain[] ???Chest pressure[] ???Palpitations [] ???Shortness of breath when laying flat [] ???Shortness of breath at rest [] ???Shortness of breath with exertion. Vascular: [] ???Pain in legs with walking[] ???Pain in legsat rest[] ???Pain in legs when laying flat [] ???Claudication [] ???Pain in feet when walking [] ???Pain in feet at rest [] ???Pain in feet when laying flat [] ???History of DVT [] ???Phlebitis [x] ???Swelling in legs [] ???Varicose veins [] ???Non-healing ulcers Pulmonary: [] ???Uses home oxygen [] ???Productive cough[] ???Hemoptysis [] ???Wheeze [] ???COPD [] ???Asthma Neurologic: [] ???Dizziness [] ???Blackouts [] ???Seizures [] ???History of stroke [] ???History of TIA[] ???Aphasia [] ???Temporary blindness[] ???Dysphagia [] ???Weaknessor numbness in arms [] ???Weakness or numbnessin legs Musculoskeletal: [] ???Arthritis [] ???Joint swelling [] ???Joint pain [] ???Low back pain Hematologic:[] ???Easy bruising[] ???Easy bleeding [] ???Hypercoagulable state [] ???Anemic  Gastrointestinal:[] ???Blood in stool[] ???Vomiting blood[] ???Gastroesophageal reflux/heartburn[] ???Abdominal pain Genitourinary: [] ???Chronic kidney disease [] ???Difficulturination [] ???Frequenturination [] ???Burning with urination[] ???Hematuria Skin: [] ???Rashes [x] ???Ulcers [x] ???Wounds Psychological: [x] ???History of anxiety[] ???History of major depression.     Physical Examination  BP (!) 145/82   Pulse 75   Ht 5\' 6"  (1.676 m)   Wt 253 lb (114.8 kg)   BMI 40.84 kg/m  Gen:  WD/WN, NAD Head: [place]/AT, No temporalis wasting. Ear/Nose/Throat: Hearing grossly intact, nares w/o erythema or drainage Eyes: Conjunctiva clear. Sclera  non-icteric Neck: Supple.  Trachea midline Pulmonary:  Good air movement, no use of accessory muscles.  Cardiac: RRR, no JVD Vascular:  Vessel Right Left  Radial Palpable Palpable                          PT Palpable Palpable  DP Palpable Palpable   Gastrointestinal: soft, non-tender/non-distended. No guarding/reflex.  Musculoskeletal: M/S 5/5 throughout.  No deformity or atrophy. 1+ BLE edema. Using a walker Neurologic: Sensation grossly intact in extremities.  Symmetrical.  Speech is fluent.  Psychiatric: Judgment intact, Mood & affect appropriate for pt's clinical situation. Dermatologic: No rashes or ulcers noted.  No cellulitis or open wounds.       Labs No results found for this or any previous visit (from the past 2160 hour(s)).  Radiology No results found.  Assessment/Plan Type 2 diabetes mellitus (HCC) blood glucose control important in reducing the progression of atherosclerotic disease. Also, involved in wound healing. On appropriate medications.  Swelling of limb Compression and elevation recommended.Worsened with her hip fracture.  Better now that her activity and ambulation are improved.  Still not resolved.  We will hold on a lymphedema pump for now as long as she is doing reasonably well with conservative therapies.    Essential hypertension blood pressure control important in reducing the progression of atherosclerotic  disease. On appropriate oral medications.  Pain in limb Does still have some phantom pain.  I given her prescription for Lyrica today as she has tried gabapentin with no improvement.  PAD (peripheral artery disease) (HCC) ABIs today are 1.16 on the right and 1.04 on the left with triphasic waveforms consistent with no arterial insufficiency.  Both transmetatarsal amputations remain well-healed.  Recheck in 1 year.    Leotis Pain, MD  07/07/2020 3:27 PM    This note was created with Dragon medical transcription system.  Any  errors from dictation are purely unintentional

## 2020-07-10 ENCOUNTER — Other Ambulatory Visit (INDEPENDENT_AMBULATORY_CARE_PROVIDER_SITE_OTHER): Payer: Self-pay | Admitting: Nurse Practitioner

## 2020-07-10 ENCOUNTER — Telehealth (INDEPENDENT_AMBULATORY_CARE_PROVIDER_SITE_OTHER): Payer: Self-pay | Admitting: Vascular Surgery

## 2020-07-10 MED ORDER — PREGABALIN 75 MG PO CAPS: 75.0000 mg | ORAL_CAPSULE | Freq: Two times a day (BID) | ORAL | 6 refills | Status: DC

## 2020-07-10 NOTE — Telephone Encounter (Signed)
Patient has being made aware

## 2020-07-10 NOTE — Telephone Encounter (Signed)
Sent!

## 2020-07-10 NOTE — Telephone Encounter (Signed)
Called requesting that we send the prescription JD gave her to [facility] so that she can receive RX in the mail. She also was asking if it is possible to get the generic brand of Lyrica (so it would be cheaper in cost). Patient was last seen 07-07-20 (with ABI's) JD. Please advise.

## 2020-07-14 ENCOUNTER — Ambulatory Visit (INDEPENDENT_AMBULATORY_CARE_PROVIDER_SITE_OTHER): Payer: Medicare HMO | Admitting: Vascular Surgery

## 2020-07-14 ENCOUNTER — Encounter (INDEPENDENT_AMBULATORY_CARE_PROVIDER_SITE_OTHER): Payer: Medicare HMO

## 2021-02-11 ENCOUNTER — Other Ambulatory Visit (INDEPENDENT_AMBULATORY_CARE_PROVIDER_SITE_OTHER): Payer: Self-pay | Admitting: Nurse Practitioner

## 2021-02-11 ENCOUNTER — Other Ambulatory Visit (INDEPENDENT_AMBULATORY_CARE_PROVIDER_SITE_OTHER): Payer: Self-pay

## 2021-02-11 MED ORDER — PREGABALIN 75 MG PO CAPS: 75.0000 mg | ORAL_CAPSULE | Freq: Two times a day (BID) | ORAL | 11 refills | Status: DC

## 2021-07-12 ENCOUNTER — Other Ambulatory Visit (INDEPENDENT_AMBULATORY_CARE_PROVIDER_SITE_OTHER): Payer: Self-pay | Admitting: Vascular Surgery

## 2021-07-12 DIAGNOSIS — I739 Peripheral vascular disease, unspecified: Secondary | ICD-10-CM

## 2021-07-13 ENCOUNTER — Encounter (INDEPENDENT_AMBULATORY_CARE_PROVIDER_SITE_OTHER): Payer: Self-pay | Admitting: Nurse Practitioner

## 2021-07-13 ENCOUNTER — Other Ambulatory Visit: Payer: Self-pay

## 2021-07-13 ENCOUNTER — Ambulatory Visit (INDEPENDENT_AMBULATORY_CARE_PROVIDER_SITE_OTHER): Payer: Medicare HMO

## 2021-07-13 ENCOUNTER — Ambulatory Visit (INDEPENDENT_AMBULATORY_CARE_PROVIDER_SITE_OTHER): Payer: Medicare HMO | Admitting: Nurse Practitioner

## 2021-07-13 VITALS — BP 136/84 | HR 72 | Resp 16

## 2021-07-13 DIAGNOSIS — I890 Lymphedema, not elsewhere classified: Secondary | ICD-10-CM | POA: Diagnosis not present

## 2021-07-13 DIAGNOSIS — I10 Essential (primary) hypertension: Secondary | ICD-10-CM

## 2021-07-13 DIAGNOSIS — I739 Peripheral vascular disease, unspecified: Secondary | ICD-10-CM

## 2021-07-13 DIAGNOSIS — E119 Type 2 diabetes mellitus without complications: Secondary | ICD-10-CM | POA: Diagnosis not present

## 2021-07-17 ENCOUNTER — Encounter (INDEPENDENT_AMBULATORY_CARE_PROVIDER_SITE_OTHER): Payer: Self-pay | Admitting: Nurse Practitioner

## 2021-07-17 NOTE — Progress Notes (Signed)
Subjective:    Patient ID: April [name], female    DOB: 12/07/1945, 75 y.o.   MRN: [id number] Chief Complaint  Patient presents with   Follow-up    Ultrasound follow up    April [name] is a 75 year old female that returns to the office for followup and review of the noninvasive studies. There have been no interval changes in lower extremity symptoms. No interval shortening of the patient's claudication distance or development of rest pain symptoms. No new ulcers or wounds have occurred since the last visit.  There have been no significant changes to the patient's overall health care.  The patient denies amaurosis fugax or recent TIA symptoms. There are no recent neurological changes noted. The patient denies history of DVT, PE or superficial thrombophlebitis. The patient denies recent episodes of angina or shortness of breath.   ABI Rt=1.01 and Lt=1.03  (previous ABI's Rt=1.16 and Lt=1.04) Duplex ultrasound of the tibial arteries has triphasic waveforms.  The patient has bilateral transmetatarsal amputations and there are no toe waveforms   Review of Systems  Musculoskeletal:  Positive for gait problem.  Skin:  Negative for wound.  All other systems reviewed and are negative.     Objective:   Physical Exam Vitals reviewed.  HENT:     Head: Normocephalic.  Cardiovascular:     Rate and Rhythm: Normal rate.     Pulses: Decreased pulses.  Pulmonary:     Effort: Pulmonary effort is normal.  Musculoskeletal:     Right lower leg: 1+ Edema present.     Left lower leg: 1+ Edema present.     Right Lower Extremity: Right leg is amputated below ankle.     Left Lower Extremity: Left leg is amputated below ankle.  Neurological:     Mental Status: She is alert and oriented to person, place, and time.  Psychiatric:        Mood and Affect: Mood normal.        Behavior: Behavior normal.        Thought Content: Thought content normal.        Judgment: Judgment normal.    BP 136/84  (BP Location: Right Arm)   Pulse 72   Resp 16   Past Medical History:  Diagnosis Date   Acute renal failure (ARF) (HCC)    Difficult intubation    Hypertension    NSTEMI (non-ST elevated myocardial infarction) (HCC)    Paroxysmal atrial fibrillation (HCC)    Renal disorder    Sepsis [facility]) January 2017   [facility]   Type 2 diabetes mellitus ([place])     Social History   Socioeconomic History   Marital status: Single    Spouse name: Not on file   Number of children: Not on file   Years of education: Not on file   Highest education level: Not on file  Occupational History   Not on file  Tobacco Use   Smoking status: Never   Smokeless tobacco: Never  Substance and Sexual Activity   Alcohol use: No    Alcohol/week: 0.0 standard drinks   Drug use: No   Sexual activity: Not on file  Other Topics Concern   Not on file  Social History Narrative   Not on file   Social Determinants of Health   Financial Resource Strain: Not on file  Food Insecurity: Not on file  Transportation Needs: Not on file  Physical Activity: Not on file  Stress: Not on file  Social Connections:  Not on file  Intimate Partner Violence: Not on file    Past Surgical History:  Procedure Laterality Date   broken hip  2019   CYSTOSCOPY WITH STENT PLACEMENT Right 11/03/2015   Procedure: CYSTOSCOPY WITH STENT PLACEMENT;  Surgeon: Hollice Espy, MD;  Location: [facility] ORS;  Service: Urology;  Laterality: Right;   FRACTURE SURGERY Right    Elbow   INTRAMEDULLARY (IM) NAIL INTERTROCHANTERIC Right 02/07/2019   Procedure: INTRAMEDULLARY (IM) NAIL INTERTROCHANTRIC;  Surgeon: Thornton Park, MD;  Location: [facility] ORS;  Service: Orthopedics;  Laterality: Right;   TONSILLECTOMY     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   Procedure: TRANSMETATARSAL AMPUTATION;  Surgeon: Algernon Huxley, MD;  Location: [facility] ORS;  Service: Vascular;  Laterality: Bilateral;    Family History  Problem Relation Age of Onset   Diabetes  Father    Heart disease Father    Stroke Maternal Grandmother     Allergies  Allergen Reactions   Prednisone Anaphylaxis   Other     CBC Latest Ref Rng & Units 02/12/2019 02/11/2019 02/10/2019  WBC 4.0 - 10.5 K/uL 33.6(H) 34.0(H) 27.4(H)  Hemoglobin 12.0 - 15.0 g/dL 11.3(L) 11.9(L) 11.5(L)  Hematocrit 36.0 - 46.0 % 35.2(L) 37.3 35.7(L)  Platelets 150 - 400 K/uL 348 314 243      CMP     Component Value Date/Time   NA 140 02/12/2019 0321   K 3.7 02/12/2019 0321   CL 103 02/12/2019 0321   CO2 29 02/12/2019 0321   GLUCOSE 153 (H) 02/12/2019 0321   BUN 17 02/12/2019 0321   CREATININE 0.58 02/12/2019 0321   CALCIUM 8.3 (L) 02/12/2019 0321   PROT 8.0 02/05/2019 2122   ALBUMIN 3.5 02/05/2019 2122   AST 68 (H) 02/05/2019 2122   ALT 42 02/05/2019 2122   ALKPHOS 94 02/05/2019 2122   BILITOT 1.2 02/05/2019 2122   GFRNONAA >60 02/12/2019 0321   GFRAA >60 02/12/2019 0321     No results found.     Assessment & Plan:   1. PAD (peripheral artery disease) (HCC)  Recommend:  The patient has evidence of atherosclerosis of the lower extremities with claudication.  The patient does not voice lifestyle limiting changes at this point in time.  Noninvasive studies do not suggest clinically significant change.  No invasive studies, angiography or surgery at this time The patient should continue walking and begin a more formal exercise program.  The patient should continue antiplatelet therapy and aggressive treatment of the lipid abnormalities  No changes in the patient's medications at this time  The patient should continue wearing graduated compression socks 10-15 mmHg strength to control the mild edema.    2. Lymphedema Patient is advised to continue with conservative therapy including use of medical grade compression stockings and elevation.  Otherwise patient is doing well.  3. Essential hypertension Continue antihypertensive medications as already ordered, these medications  have been reviewed and there are no changes at this time.   4. Type 2 diabetes mellitus without complication, unspecified whether long term insulin use ([place]) Continue hypoglycemic medications as already ordered, these medications have been reviewed and there are no changes at this time.  Hgb A1C to be monitored as already arranged by primary service    Current Outpatient Medications on File Prior to Visit  Medication Sig Dispense Refill   acetaminophen (TYLENOL) 500 MG tablet Take 1,000 mg by mouth every 8 (eight) hours as needed for moderate pain. Reported on 12/16/2015     diclofenac sodium (VOLTAREN)  1 % GEL Apply 4 g topically 4 (four) times daily.     docusate sodium (COLACE) 100 MG capsule Take 1 capsule (100 mg total) by mouth 2 (two) times daily. 10 capsule 0   empagliflozin (JARDIANCE) 10 MG TABS tablet Take by mouth daily.     meloxicam (MOBIC) 15 MG tablet Take 15 mg by mouth as needed.      metFORMIN (GLUCOPHAGE) 500 MG tablet Take 500 mg by mouth 2 (two) times daily.      metoprolol tartrate (LOPRESSOR) 25 MG tablet Take 1 tablet (25 mg total) by mouth 2 (two) times daily.     oxyCODONE (OXY IR/ROXICODONE) 5 MG immediate release tablet Take 1 tablet (5 mg total) by mouth every 6 (six) hours as needed for moderate pain or severe pain. 30 tablet 0   polyethylene glycol (MIRALAX / GLYCOLAX) packet Take 17 g by mouth daily. (Patient taking differently: Take 17 g by mouth daily as needed.) 14 each 0   pravastatin (PRAVACHOL) 20 MG tablet      pregabalin (LYRICA) 75 MG capsule Take 1 capsule (75 mg total) by mouth 2 (two) times daily. 60 capsule 11   traMADol (ULTRAM) 50 MG tablet      aspirin EC 81 MG EC tablet Take 1 tablet (81 mg total) by mouth daily. (Patient not taking: Reported on 07/13/2021) 90 tablet 3   enoxaparin (LOVENOX) 40 MG/0.4ML injection Inject 0.4 mLs (40 mg total) into the skin every 12 (twelve) hours for 28 days.     insulin glargine (LANTUS) 100 UNIT/ML injection  Inject 0.15 mLs (15 Units total) into the skin daily. (Patient not taking: Reported on 07/13/2021) 10 mL 11   tamsulosin (FLOMAX) 0.4 MG CAPS capsule Take 1 capsule (0.4 mg total) by mouth daily. (Patient not taking: Reported on 07/13/2021) 30 capsule    traZODone (DESYREL) 50 MG tablet Take 50 mg by mouth at bedtime. (Patient not taking: Reported on 07/13/2021)     No current facility-administered medications on file prior to visit.    There are no Patient Instructions on file for this visit. No follow-ups on file.   Kris Hartmann, NP

## 2021-08-16 ENCOUNTER — Other Ambulatory Visit (INDEPENDENT_AMBULATORY_CARE_PROVIDER_SITE_OTHER): Payer: Self-pay | Admitting: Nurse Practitioner

## 2021-08-17 ENCOUNTER — Other Ambulatory Visit (INDEPENDENT_AMBULATORY_CARE_PROVIDER_SITE_OTHER): Payer: Self-pay | Admitting: Nurse Practitioner

## 2021-08-17 ENCOUNTER — Other Ambulatory Visit (INDEPENDENT_AMBULATORY_CARE_PROVIDER_SITE_OTHER): Payer: Self-pay

## 2021-08-17 MED ORDER — PREGABALIN 75 MG PO CAPS: 75.0000 mg | ORAL_CAPSULE | Freq: Two times a day (BID) | ORAL | 5 refills | Status: DC

## 2021-08-17 NOTE — Telephone Encounter (Signed)
Is this ok to fill? 

## 2022-01-28 ENCOUNTER — Emergency Department: Payer: Medicare HMO

## 2022-01-28 ENCOUNTER — Other Ambulatory Visit: Payer: Self-pay

## 2022-01-28 ENCOUNTER — Inpatient Hospital Stay
Admission: EM | Admit: 2022-01-28 | Discharge: 2022-02-02 | DRG: 563 | Disposition: A | Discharge status: Skilled Nursing Facility | Payer: Medicare HMO | Attending: Internal Medicine | Admitting: Internal Medicine

## 2022-01-28 ENCOUNTER — Inpatient Hospital Stay: Payer: Medicare HMO

## 2022-01-28 DIAGNOSIS — R9389 Abnormal findings on diagnostic imaging of other specified body structures: Secondary | ICD-10-CM

## 2022-01-28 DIAGNOSIS — S42213A Unspecified displaced fracture of surgical neck of unspecified humerus, initial encounter for closed fracture: Secondary | ICD-10-CM

## 2022-01-28 DIAGNOSIS — G8911 Acute pain due to trauma: Secondary | ICD-10-CM | POA: Diagnosis present

## 2022-01-28 DIAGNOSIS — Z791 Long term (current) use of non-steroidal anti-inflammatories (NSAID): Secondary | ICD-10-CM

## 2022-01-28 DIAGNOSIS — W1830XA Fall on same level, unspecified, initial encounter: Secondary | ICD-10-CM | POA: Diagnosis present

## 2022-01-28 DIAGNOSIS — Z794 Long term (current) use of insulin: Secondary | ICD-10-CM | POA: Diagnosis not present

## 2022-01-28 DIAGNOSIS — Z8249 Family history of ischemic heart disease and other diseases of the circulatory system: Secondary | ICD-10-CM | POA: Diagnosis not present

## 2022-01-28 DIAGNOSIS — I480 Paroxysmal atrial fibrillation: Secondary | ICD-10-CM | POA: Diagnosis present

## 2022-01-28 DIAGNOSIS — R911 Solitary pulmonary nodule: Secondary | ICD-10-CM | POA: Diagnosis present

## 2022-01-28 DIAGNOSIS — C3432 Malignant neoplasm of lower lobe, left bronchus or lung: Secondary | ICD-10-CM | POA: Diagnosis present

## 2022-01-28 DIAGNOSIS — Z7982 Long term (current) use of aspirin: Secondary | ICD-10-CM | POA: Diagnosis not present

## 2022-01-28 DIAGNOSIS — Z833 Family history of diabetes mellitus: Secondary | ICD-10-CM

## 2022-01-28 DIAGNOSIS — N3000 Acute cystitis without hematuria: Secondary | ICD-10-CM

## 2022-01-28 DIAGNOSIS — Z823 Family history of stroke: Secondary | ICD-10-CM

## 2022-01-28 DIAGNOSIS — Y92014 Private driveway to single-family (private) house as the place of occurrence of the external cause: Secondary | ICD-10-CM

## 2022-01-28 DIAGNOSIS — I10 Essential (primary) hypertension: Secondary | ICD-10-CM | POA: Diagnosis present

## 2022-01-28 DIAGNOSIS — N390 Urinary tract infection, site not specified: Secondary | ICD-10-CM | POA: Diagnosis present

## 2022-01-28 DIAGNOSIS — Z7984 Long term (current) use of oral hypoglycemic drugs: Secondary | ICD-10-CM | POA: Diagnosis not present

## 2022-01-28 DIAGNOSIS — Z888 Allergy status to other drugs, medicaments and biological substances status: Secondary | ICD-10-CM | POA: Diagnosis not present

## 2022-01-28 DIAGNOSIS — B9620 Unspecified Escherichia coli [E. coli] as the cause of diseases classified elsewhere: Secondary | ICD-10-CM | POA: Diagnosis present

## 2022-01-28 DIAGNOSIS — R0902 Hypoxemia: Secondary | ICD-10-CM

## 2022-01-28 DIAGNOSIS — R918 Other nonspecific abnormal finding of lung field: Secondary | ICD-10-CM | POA: Diagnosis not present

## 2022-01-28 DIAGNOSIS — I252 Old myocardial infarction: Secondary | ICD-10-CM

## 2022-01-28 DIAGNOSIS — E1151 Type 2 diabetes mellitus with diabetic peripheral angiopathy without gangrene: Secondary | ICD-10-CM | POA: Diagnosis present

## 2022-01-28 DIAGNOSIS — E119 Type 2 diabetes mellitus without complications: Secondary | ICD-10-CM

## 2022-01-28 DIAGNOSIS — Z79899 Other long term (current) drug therapy: Secondary | ICD-10-CM | POA: Diagnosis not present

## 2022-01-28 DIAGNOSIS — S42211A Unspecified displaced fracture of surgical neck of right humerus, initial encounter for closed fracture: Secondary | ICD-10-CM | POA: Diagnosis present

## 2022-01-28 LAB — BASIC METABOLIC PANEL
Anion gap: 11 (ref 5–15)
BUN: 20 mg/dL (ref 8–23)
CO2: 26 mmol/L (ref 22–32)
Calcium: 9.3 mg/dL (ref 8.9–10.3)
Chloride: 100 mmol/L (ref 98–111)
Creatinine, Ser: 0.72 mg/dL (ref 0.44–1.00)
GFR, Estimated: >60 mL/min (ref >60)
Glucose, Bld: 105 mg/dL — ABNORMAL HIGH (ref 70–99)
Potassium: 4.3 mmol/L (ref 3.5–5.1)
Sodium: 137 mmol/L (ref 135–145)

## 2022-01-28 LAB — URINALYSIS, ROUTINE W REFLEX MICROSCOPIC
Bilirubin Urine: NEGATIVE
Glucose, UA: >=500 mg/dL — AB
Hgb urine dipstick: NEGATIVE
Ketones, ur: 5 mg/dL — AB
Nitrite: POSITIVE — AB
Protein, ur: NEGATIVE mg/dL
Specific Gravity, Urine: 1.022 (ref 1.005–1.030)
pH: 5 (ref 5.0–8.0)

## 2022-01-28 LAB — CBC WITH DIFFERENTIAL/PLATELET
Abs Immature Granulocytes: 0.11 10*3/uL — ABNORMAL HIGH (ref 0.00–0.07)
Basophils Absolute: 0 10*3/uL (ref 0.0–0.1)
Basophils Relative: 0 %
Eosinophils Absolute: 0.1 10*3/uL (ref 0.0–0.5)
Eosinophils Relative: 1 %
HCT: 50.8 % — ABNORMAL HIGH (ref 36.0–46.0)
Hemoglobin: 16.1 g/dL — ABNORMAL HIGH (ref 12.0–15.0)
Immature Granulocytes: 1 %
Lymphocytes Relative: 8 %
Lymphs Abs: 1.2 10*3/uL (ref 0.7–4.0)
MCH: 28.1 pg (ref 26.0–34.0)
MCHC: 31.7 g/dL (ref 30.0–36.0)
MCV: 88.8 fL (ref 80.0–100.0)
Monocytes Absolute: 1.6 10*3/uL — ABNORMAL HIGH (ref 0.1–1.0)
Monocytes Relative: 10 %
Neutro Abs: 12.6 10*3/uL — ABNORMAL HIGH (ref 1.7–7.7)
Neutrophils Relative %: 80 %
Platelets: 327 10*3/uL (ref 150–400)
RBC: 5.72 MIL/uL — ABNORMAL HIGH (ref 3.87–5.11)
RDW: 14.6 % (ref 11.5–15.5)
WBC: 15.6 10*3/uL — ABNORMAL HIGH (ref 4.0–10.5)
nRBC: 0 % (ref 0.0–0.2)

## 2022-01-28 LAB — GLUCOSE, CAPILLARY: Glucose-Capillary: 182 mg/dL — ABNORMAL HIGH (ref 70–99)

## 2022-01-28 LAB — TROPONIN I (HIGH SENSITIVITY): Troponin I (High Sensitivity): 6 ng/L (ref <18)

## 2022-01-28 LAB — D-DIMER, QUANTITATIVE: D-Dimer, Quant: 5.4 ug/mL-FEU — ABNORMAL HIGH (ref 0.00–0.50)

## 2022-01-28 LAB — SAMPLE TO BLOOD BANK

## 2022-01-28 MED ORDER — IOHEXOL 300 MG/ML  SOLN: 75.0000 mL | Freq: Once | INTRAMUSCULAR | Status: DC | PRN

## 2022-01-28 MED ORDER — METOPROLOL TARTRATE 25 MG PO TABS
25.0000 mg | ORAL_TABLET | Freq: Two times a day (BID) | ORAL | Status: DC
  Administered 2022-01-28 – 2022-02-02 (×9): 25 mg via ORAL
  Filled 2022-01-28 (×10): qty 1

## 2022-01-28 MED ORDER — PREGABALIN 75 MG PO CAPS
75.0000 mg | ORAL_CAPSULE | Freq: Two times a day (BID) | ORAL | Status: DC
  Administered 2022-01-28 – 2022-02-02 (×10): 75 mg via ORAL
  Filled 2022-01-28 (×10): qty 1

## 2022-01-28 MED ORDER — ONDANSETRON HCL 4 MG/2ML IJ SOLN
4.0000 mg | Freq: Once | INTRAMUSCULAR | Status: AC
  Administered 2022-01-28: 4 mg via INTRAVENOUS
  Filled 2022-01-28: qty 2

## 2022-01-28 MED ORDER — SODIUM CHLORIDE 0.9 % IV SOLN: 1.0000 g | INTRAVENOUS | Status: DC

## 2022-01-28 MED ORDER — INSULIN ASPART 100 UNIT/ML IJ SOLN
0.0000 [IU] | Freq: Three times a day (TID) | INTRAMUSCULAR | Status: DC
  Administered 2022-01-29: 2 [IU] via SUBCUTANEOUS
  Administered 2022-01-29: 3 [IU] via SUBCUTANEOUS
  Administered 2022-01-29 – 2022-02-01 (×9): 2 [IU] via SUBCUTANEOUS
  Filled 2022-01-28 (×12): qty 1

## 2022-01-28 MED ORDER — DOCUSATE SODIUM 100 MG PO CAPS
100.0000 mg | ORAL_CAPSULE | Freq: Two times a day (BID) | ORAL | Status: DC
  Administered 2022-01-28 – 2022-02-02 (×8): 100 mg via ORAL
  Filled 2022-01-28 (×10): qty 1

## 2022-01-28 MED ORDER — POLYETHYLENE GLYCOL 3350 17 G PO PACK
17.0000 g | PACK | Freq: Every day | ORAL | Status: DC | PRN
  Administered 2022-01-28: 17 g via ORAL
  Filled 2022-01-28 (×2): qty 1

## 2022-01-28 MED ORDER — FENTANYL CITRATE PF 50 MCG/ML IJ SOSY
100.0000 ug | PREFILLED_SYRINGE | Freq: Once | INTRAMUSCULAR | Status: AC
  Administered 2022-01-28: 100 ug via INTRAVENOUS
  Filled 2022-01-28: qty 2

## 2022-01-28 MED ORDER — ACETAMINOPHEN 325 MG PO TABS
650.0000 mg | ORAL_TABLET | Freq: Four times a day (QID) | ORAL | Status: DC | PRN
  Administered 2022-02-02: 650 mg via ORAL
  Filled 2022-01-28: qty 2

## 2022-01-28 MED ORDER — OXYCODONE-ACETAMINOPHEN 5-325 MG PO TABS
1.0000 | ORAL_TABLET | Freq: Once | ORAL | Status: AC
  Administered 2022-01-28: 1 via ORAL
  Filled 2022-01-28: qty 1

## 2022-01-28 MED ORDER — ONDANSETRON HCL 4 MG PO TABS: 4.0000 mg | ORAL_TABLET | Freq: Four times a day (QID) | ORAL | Status: DC | PRN

## 2022-01-28 MED ORDER — SODIUM CHLORIDE 0.9 % IV SOLN
1.0000 g | Freq: Once | INTRAVENOUS | Status: AC
  Administered 2022-01-28: 1 g via INTRAVENOUS
  Filled 2022-01-28: qty 10

## 2022-01-28 MED ORDER — OXYCODONE HCL 5 MG PO TABS
5.0000 mg | ORAL_TABLET | Freq: Four times a day (QID) | ORAL | Status: DC | PRN
  Administered 2022-01-28 – 2022-01-29 (×2): 5 mg via ORAL
  Filled 2022-01-28 (×2): qty 1

## 2022-01-28 MED ORDER — MORPHINE SULFATE (PF) 2 MG/ML IV SOLN: 2.0000 mg | INTRAVENOUS | Status: DC | PRN

## 2022-01-28 MED ORDER — ACETAMINOPHEN 650 MG RE SUPP: 650.0000 mg | Freq: Four times a day (QID) | RECTAL | Status: DC | PRN

## 2022-01-28 MED ORDER — ENOXAPARIN SODIUM 60 MG/0.6ML IJ SOSY
0.5000 mg/kg | PREFILLED_SYRINGE | INTRAMUSCULAR | Status: DC
  Administered 2022-01-28 – 2022-02-01 (×5): 50 mg via SUBCUTANEOUS
  Filled 2022-01-28 (×5): qty 0.6

## 2022-01-28 MED ORDER — ONDANSETRON HCL 4 MG/2ML IJ SOLN: 4.0000 mg | Freq: Four times a day (QID) | INTRAMUSCULAR | Status: DC | PRN

## 2022-01-28 MED ORDER — PRAVASTATIN SODIUM 20 MG PO TABS
40.0000 mg | ORAL_TABLET | Freq: Every day | ORAL | Status: DC
  Administered 2022-01-28 – 2022-02-02 (×6): 40 mg via ORAL
  Filled 2022-01-28 (×6): qty 2

## 2022-01-28 MED ORDER — SODIUM CHLORIDE 0.9 % IV SOLN
500.0000 mg | INTRAVENOUS | Status: DC
  Administered 2022-01-28: 500 mg via INTRAVENOUS
  Filled 2022-01-28 (×2): qty 5

## 2022-01-28 MED ORDER — HYDROCODONE-ACETAMINOPHEN 5-325 MG PO TABS
1.0000 | ORAL_TABLET | ORAL | Status: DC | PRN
  Administered 2022-01-28: 1 via ORAL
  Filled 2022-01-28: qty 1

## 2022-01-28 NOTE — ED Notes (Signed)
Pt currently eating her dinner meal tray brought to her, eating without difficulty  ?

## 2022-01-28 NOTE — ED Notes (Signed)
This RN cleaned pt up. She was saturated in cold wet urine. Brief was saturated as well. Chux placed underneath her and purewick placed to obtain UA as pt just received 100 mcg of fentanyl and cannot stand up.  ?

## 2022-01-28 NOTE — ED Notes (Signed)
Unable to stand pt and ambulate her.  ?

## 2022-01-28 NOTE — ED Triage Notes (Signed)
Pt presents to ED via [facility] with c/o of fall yesterday evening while bringing recycle bin in. Pt has c/o of R shoulder pain, no deformity noted.  ? ?No LOC.  ?

## 2022-01-28 NOTE — ED Notes (Signed)
RN to bedside to introduce self to pt and initiate care.  ?

## 2022-01-28 NOTE — ED Notes (Signed)
Printer not working. Placed white pt labels on all labs. IT ticket placed.  ?

## 2022-01-28 NOTE — ED Notes (Signed)
Pt given food tray.

## 2022-01-28 NOTE — Progress Notes (Signed)
PHARMACIST - PHYSICIAN COMMUNICATION ? ?CONCERNING:  Enoxaparin (Lovenox) for DVT Prophylaxis  ? ? ?RECOMMENDATION: ?Patient was prescribed enoxaprin 40mg  q24 hours for VTE prophylaxis.  ? ?Filed Weights  ? 01/28/22 1059  ?Weight: 99.8 kg (220 lb)  ? ? ?Body mass index is 35.51 kg/m?. ? ?Estimated Creatinine Clearance: 72.4 mL/min (by C-G formula based on SCr of 0.72 mg/dL). ? ? ?Based on [place] patient is candidate for enoxaparin 0.5mg /kg TBW SQ every 24 hours based on BMI being >30. ? ?Patient is candidate for enoxaparin 30mg  every 24 hours based on CrCl <60ml/min or Weight <45kg ? ?DESCRIPTION: ?Pharmacy has adjusted enoxaparin dose per [facility] policy. ? ?Patient is now receiving enoxaparin 50 mg every 24 hours  ? ? ?Darrick Penna, PharmD ?Clinical Pharmacist  ?01/28/2022 ?7:20 PM ? ?

## 2022-01-28 NOTE — ED Provider Notes (Addendum)
? ?[facility] ?Provider Note ? ? ? Event Date/Time  ? First MD Initiated Contact with Patient 01/28/22 1110   ?  (approximate) ? ? ?History  ? ?Fall and Shoulder Pain ? ? ?HPI ? ?April [name] is a 76 y.o. female with history of DM II, acute renal failure, HTN and as listed in EMR presents to the emergency department for treatment and evaluation of pain after falling yesterday evening. She had taken her recycling to the road. The can kept rolling away from her and eventually fell over. The lid flew up and hit her causing her to fall onto her right side. She denies loss of consciousness or hitting her head. She has right shoulder and knee pain and bruising to the left upper arm. She was assisted back inside her house by EMS and was able to ambulate. Today, she was unable to get out of her chair due to pain in the right arm and knee. No relief with muscle rub cream or oxycodone. ? ?  ? ? ?Physical Exam  ? ?Triage Vital Signs: ?ED Triage Vitals  ?Enc Vitals Group  ?   BP 01/28/22 1058 124/79  ?   Pulse Rate 01/28/22 1058 70  ?   Resp 01/28/22 1058 19  ?   Temp 01/28/22 1058 98.2 ?F (36.8 ?C)  ?   Temp Source 01/28/22 1058 Oral  ?   SpO2 01/28/22 1058 94 %  ?   Weight 01/28/22 1059 220 lb (99.8 kg)  ?   Height 01/28/22 1059 5\' 6"  (1.676 m)  ?   Head Circumference --   ?   Peak Flow --   ?   Pain Score 01/28/22 1059 10  ?   Pain Loc --   ?   Pain Edu? --   ?   Excl. in [place]? --   ? ? ?Most recent vital signs: ?Vitals:  ? 01/28/22 1600 01/28/22 1724  ?BP: (!) 110/49   ?Pulse: 70 73  ?Resp: 19   ?Temp:    ?SpO2: 93% 94%  ? ? ?General: Awake, no distress.  ?CV:  Good peripheral perfusion.  ?Resp:  Normal effort.  ?Abd:  No distention. No tenderness to palpation ?Other:  No cervical midline tenderness of cervical, thoracic, or lumbar spine. Contusion to the right upper arm, left lower arm, right knee. Skin tear to the right lower leg. Able to demonstrate flexion of the right knee with pain at the end  point. Very limited ROM of the right shoulder due to pain. FROM of the left upper and lower extremity. ? ? ?ED Results / Procedures / Treatments  ? ?Labs ?(all labs ordered are listed, but only abnormal results are displayed) ?Labs Reviewed  ?BASIC METABOLIC PANEL - Abnormal; Notable for the following components:  ?    Result Value  ? Glucose, Bld 105 (*)   ? All other components within normal limits  ?CBC WITH DIFFERENTIAL/PLATELET - Abnormal; Notable for the following components:  ? WBC 15.6 (*)   ? RBC 5.72 (*)   ? Hemoglobin 16.1 (*)   ? HCT 50.8 (*)   ? Neutro Abs 12.6 (*)   ? Monocytes Absolute 1.6 (*)   ? Abs Immature Granulocytes 0.11 (*)   ? All other components within normal limits  ?URINALYSIS, ROUTINE W REFLEX MICROSCOPIC - Abnormal; Notable for the following components:  ? Color, Urine YELLOW (*)   ? APPearance HAZY (*)   ? Glucose, UA >=500 (*)   ?  Ketones, ur 5 (*)   ? Nitrite POSITIVE (*)   ? Leukocytes,Ua TRACE (*)   ? Bacteria, UA MANY (*)   ? All other components within normal limits  ?D-DIMER, QUANTITATIVE  ?SAMPLE TO BLOOD BANK  ?TROPONIN I (HIGH SENSITIVITY)  ? ? ? ?EKG ? ?Sinus rhythm with a rate of 67.  No ST elevation ? ? ?RADIOLOGY ? ?Image reviewed and interpreted by me.  ?Radiology report reviewed. ? ?Images of the right knee shows no acute bony abnormality. ? ?Image of the right shoulder shows a mildly displaced proximal humeral neck fracture. ? ?CT head and cervical spine negative for acute concerns. ? ?Chest x-ray shows a nodular opacity of the right midlung. ? ?PROCEDURES: ? ?Critical Care performed: Yes. ? ?CRITICAL CARE ?Performed by: Sherrie George ? ? ?Total critical care time: 45 minutes ? ?Critical care time was exclusive of separately billable procedures and treating other patients. ? ?Critical care was necessary to treat or prevent imminent or life-threatening deterioration. ? ?Critical care was time spent personally by me on the following activities: development of treatment  plan with patient and/or surrogate as well as nursing, discussions with consultants, evaluation of patient's response to treatment, examination of patient, obtaining history from patient or surrogate, ordering and performing treatments and interventions, ordering and review of laboratory studies, ordering and review of radiographic studies, pulse oximetry and re-evaluation of patient's condition. ? ? ?Procedures ? ?Sling applied to right arm.  ? ? ?MEDICATIONS ORDERED IN ED: ?Medications  ?iohexol (OMNIPAQUE) 300 MG/ML solution 75 mL (has no administration in time range)  ?fentaNYL (SUBLIMAZE) injection 100 mcg (100 mcg Intravenous Given 01/28/22 1252)  ?ondansetron (ZOFRAN) injection 4 mg (4 mg Intravenous Given 01/28/22 1250)  ?oxyCODONE-acetaminophen (PERCOCET/ROXICET) 5-325 MG per tablet 1 tablet (1 tablet Oral Given 01/28/22 1543)  ?cefTRIAXone (ROCEPHIN) 1 g in sodium chloride 0.9 % 100 mL IVPB (1 g Intravenous New Bag/Given 01/28/22 1700)  ? ? ? ?IMPRESSION / MDM / ASSESSMENT AND PLAN / ED COURSE  ? ?I have reviewed the triage note. ? ?Differential diagnosis includes, but is not limited to: shoulder dislocation, humerus fracture, knee injury. ? ?76 year old female presenting to the emergency department after mechanical, nonsyncopal fall yesterday at home.  See HPI for further details.  ? ?Exam is overall reassuring.  She does have contusions over the right upper arm and left forearm. 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  ? ?Image of the right shoulder shows a moderately displaced proximal right humeral neck fracture. ? ?Labs show leukocytosis at 15.6. Previous labs show chronically elevated WBC. Awaiting results of urinalysis and will plan to see how well patient is going to ambulate with one arm in a sling and her already reduced balance.  ? ?RN states that patient's oxygen saturation decreases to the 80s when oxygen is  removed. Patient denies chest pain, shortness of breath, or previous oxygen requirement. Troponin and chest x-ray ordered. ? ?Troponin is normal.  Chest x-ray does show a nodular opacity of the right mid lung with recommendation for CT.  CT with contrast ordered. ? ?Attempted to get an ambulatory saturation. Patient unable to get out of bed even with assistance of 3. She will require admission for further workup of the new oxygen requirement and PT/OT or rehab placement due to humerus fracture and balance limitations. ? ?Patient accepted for admission by hospitalist service. CT with contrast changed to  CTA for PE and d-dimer ordered as requested. ? ?Case discussed with Dr. Sharlet Salina with orthopedics for proximal humerus fracture. Sling advised which is already in place. ?  ? ? ?FINAL CLINICAL IMPRESSION(S) / ED DIAGNOSES  ? ?Final diagnoses:  ?Closed displaced fracture of surgical neck of humerus, unspecified fracture morphology, initial encounter  ?Acute cystitis without hematuria  ?Hypoxia  ? ? ? ?Rx / DC Orders  ? ?ED Discharge Orders   ? ? None  ? ?  ? ? ? ?Note:  This document was prepared using Dragon voice recognition software and may include unintentional dictation errors. ?  ?Victorino Dike, FNP ?01/28/22 1844 ? ?  ?Victorino Dike, FNP ?01/28/22 1859 ? ?  ?Merlyn Lot, MD ?01/29/22 1238 ? ?

## 2022-01-28 NOTE — ED Notes (Signed)
Pt is refusing CT scan at this time. ?

## 2022-01-28 NOTE — ED Notes (Signed)
RN assess EMS IV. It is not patent. DC.  ?

## 2022-01-28 NOTE — H&P (Signed)
? ? ?History and Physical:  ? ? ?April [name]  ? ?FWY:[id number] DOB: 05-12-1946 DOA: 01/28/2022 ? ?PCP: Albina Billet, MD  ? ?Patient coming from: Home ? ?Chief Complaint: Fall with humerus fracture found to have hypoxia and UTI ? ?History of Present Illness:  ? ?April [name] is an 76 y.o. female DM 2, HTN, PVD, chronic leukocytosis of unclear etiology was in her USO H until earlier today when she had a mechanical fall as she was trying to take the trash out.  Patient had pain in her right arm and presented to the ED for evaluation.  She was noted to have a right humeral fracture which was splinted.  As patient was getting ready to go home, patient was noted to have significant hypoxia with ambulation.  She had no hypoxia at rest.  Patient denies any history of prior need for oxygen.  Patient denies recent fevers or chills.  Denies malaise.  Denies new cough.  Patient does say that she has had dental difficulties in the past month and is wondering if perhaps tooth infections can be related to having a pneumonia. ? ?ED Course:  The patient underwent trauma work-up which was negative for head or neck abnormalities.  She did have a right humerus fracture as noted above.  Chest x-ray did show a right middle lobe nodular lesion and the CT did show a left upper lobe groundglass nodule as well.  She was also noted to have a UTI.  Patient was called in for admission for hypoxia that is unexplained. ? ?ROS:  ? ?ROS  ? ?Review of Systems: ?General: Denies fever, chills, malaise,  ?Cardiovascular: Denies chest pain or palpitations ?GI: Denies nausea, vomiting, diarrhea or constipation ?GU: Denies dysuria, frequency or hematuria ? ?Past Medical History:  ? ?Past Medical History:  ?Diagnosis Date  ? Acute renal failure (ARF) (HCC)   ? Difficult intubation   ? Hypertension   ? NSTEMI (non-ST elevated myocardial infarction) ([place])   ? Paroxysmal atrial fibrillation (HCC)   ? Renal disorder   ? Sepsis [facility]) January 2017  ? [place]  ?  Type 2 diabetes mellitus ([place])   ? ? ?Past Surgical History:  ? ?Past Surgical History:  ?Procedure Laterality Date  ? broken hip  2019  ? CYSTOSCOPY WITH STENT PLACEMENT Right 11/03/2015  ? Procedure: CYSTOSCOPY WITH STENT PLACEMENT;  Surgeon: Hollice Espy, MD;  Location: [facility] ORS;  Service: Urology;  Laterality: Right;  ? FRACTURE SURGERY Right   ? Elbow  ? INTRAMEDULLARY (IM) NAIL INTERTROCHANTERIC Right 02/07/2019  ? Procedure: INTRAMEDULLARY (IM) NAIL INTERTROCHANTRIC;  Surgeon: Thornton Park, MD;  Location: [facility] ORS;  Service: Orthopedics;  Laterality: Right;  ? TONSILLECTOMY    ? 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  ? Procedure: TRANSMETATARSAL AMPUTATION;  Surgeon: Algernon Huxley, MD;  Location: [facility] ORS;  Service: Vascular;  Laterality: Bilateral;  ? ? ?Social History:  ? ?Social History  ? ?Socioeconomic History  ? Marital status: Single  ?  Spouse name: Not on file  ? Number of children: Not on file  ? Years of education: Not on file  ? Highest education level: Not on file  ?Occupational History  ? Not on file  ?Tobacco Use  ? Smoking status: Never  ? Smokeless tobacco: Never  ?Substance and Sexual Activity  ? Alcohol use: No  ?  Alcohol/week: 0.0 standard drinks  ? Drug use: No  ? Sexual activity: Not on file  ?Other Topics Concern  ? Not  on file  ?Social History Narrative  ? Not on file  ? ?Social Determinants of Health  ? ?Financial Resource Strain: Not on file  ?Food Insecurity: Not on file  ?Transportation Needs: Not on file  ?Physical Activity: Not on file  ?Stress: Not on file  ?Social Connections: Not on file  ?Intimate Partner Violence: Not on file  ? ? ?Allergies  ? ?Prednisone and Other ? ?Family history:  ? ?Family History  ?Problem Relation Age of Onset  ? Diabetes Father   ? Heart disease Father   ? Stroke Maternal Grandmother   ? ? ?Current Medications:  ? ?Prior to Admission medications   ?Medication Sig Start Date End Date Taking? Authorizing Provider  ?empagliflozin (JARDIANCE)  10 MG TABS tablet Take by mouth daily.   Yes [provider]  ?acetaminophen (TYLENOL) 500 MG tablet Take 1,000 mg by mouth every 8 (eight) hours as needed for moderate pain. Reported on 12/16/2015    [provider]  ?aspirin EC 81 MG EC tablet Take 1 tablet (81 mg total) by mouth daily. ?Patient not taking: Reported on 07/13/2021 03/04/16   Algernon Huxley, MD  ?diclofenac sodium (VOLTAREN) 1 % GEL Apply 4 g topically 4 (four) times daily.    [provider]  ?docusate sodium (COLACE) 100 MG capsule Take 1 capsule (100 mg total) by mouth 2 (two) times daily. 12/10/15   Dustin Flock, MD  ?enoxaparin (LOVENOX) 40 MG/0.4ML injection Inject 0.4 mLs (40 mg total) into the skin every 12 (twelve) hours for 28 days. 02/12/19 03/12/19  Dustin Flock, MD  ?insulin glargine (LANTUS) 100 UNIT/ML injection Inject 0.15 mLs (15 Units total) into the skin daily. ?Patient not taking: Reported on 07/13/2021 11/09/15   Henreitta Leber, MD  ?meloxicam (MOBIC) 15 MG tablet Take 15 mg by mouth as needed.  ?Patient not taking: Reported on 01/28/2022 02/01/19   [provider]  ?metFORMIN (GLUCOPHAGE) 500 MG tablet Take 500 mg by mouth 2 (two) times daily.  09/13/17   [provider]  ?metoprolol tartrate (LOPRESSOR) 25 MG tablet Take 1 tablet (25 mg total) by mouth 2 (two) times daily. 11/09/15   Henreitta Leber, MD  ?oxyCODONE (OXY IR/ROXICODONE) 5 MG immediate release tablet Take 1 tablet (5 mg total) by mouth every 6 (six) hours as needed for moderate pain or severe pain. 02/12/19   Dustin Flock, MD  ?polyethylene glycol (MIRALAX / GLYCOLAX) packet Take 17 g by mouth daily. ?Patient taking differently: Take 17 g by mouth daily as needed. 12/10/15   Dustin Flock, MD  ?pravastatin (PRAVACHOL) 20 MG tablet  12/05/16   [provider]  ?pravastatin (PRAVACHOL) 40 MG tablet Take 40 mg by mouth daily. 11/23/21   [provider]  ?pregabalin (LYRICA) 75 MG capsule Take 1 capsule (75  mg total) by mouth 2 (two) times daily. 08/17/21   Kris Hartmann, NP  ?tamsulosin (FLOMAX) 0.4 MG CAPS capsule Take 1 capsule (0.4 mg total) by mouth daily. ?Patient not taking: Reported on 07/13/2021 12/10/15   Dustin Flock, MD  ?traMADol Veatrice Bourbon) 50 MG tablet  06/10/19   [provider]  ?traZODone (DESYREL) 50 MG tablet Take 50 mg by mouth at bedtime. ?Patient not taking: Reported on 07/13/2021    [provider]  ? ? ?Physical Exam:  ? ?Vitals:  ? 01/28/22 1515 01/28/22 1530 01/28/22 1600 01/28/22 1724  ?BP:  (!) 128/56 (!) 110/49   ?Pulse: 79 76 70 73  ?Resp: 19 16  19   ?Temp:      ?TempSrc:      ?SpO2: (!) 89% 95% 93% 94%  ?Weight:      ?Height:      ? ? ? ?Physical Exam: ?Blood pressure (!) 110/49, pulse 73, temperature 98.2 ?F (36.8 ?C), temperature source Oral, resp. rate 19, height 5\' 6"  (1.676 m), weight 99.8 kg, SpO2 94 %. ?Gen: Friendly chatty patient lying in bed with arm in sling in NAD.  ?Eyes: sclera anicteric, conjuctiva mildly injected bilaterally ?CVS: S1-S2, regulary, no gallops ?Respiratory:  decreased air entry likely secondary to decreased inspiratory effort, no adventitious sounds ?GI: NABS, obese, firm but not hard NT  ?LE: Venous stasis disease with varicosities, no edema. No cyanosis ?Neuro: A/O x 3,  grossly nonfocal.  ?Psych: mood and affect appropriate to situation. ? ? ?Data Review:  ? ? ?Labs: ?Basic Metabolic Panel: ?Recent Labs  ?Lab 01/28/22 ?1125  ?NA 137  ?K 4.3  ?CL 100  ?CO2 26  ?GLUCOSE 105*  ?BUN 20  ?CREATININE 0.72  ?CALCIUM 9.3  ? ?Liver Function Tests: ?No results for input(s): AST, ALT, ALKPHOS, BILITOT, PROT, ALBUMIN in the last 168 hours. ?No results for input(s): LIPASE, AMYLASE in the last 168 hours. ?No results for input(s): AMMONIA in the last 168 hours. ?CBC: ?Recent Labs  ?Lab 01/28/22 ?1125  ?WBC 15.6*  ?NEUTROABS 12.6*  ?HGB 16.1*  ?HCT 50.8*  ?MCV 88.8  ?PLT 327  ? ?Cardiac Enzymes: ?No results for input(s): CKTOTAL, CKMB, CKMBINDEX,  TROPONINI in the last 168 hours. ? ?BNP (last 3 results) ?No results for input(s): PROBNP in the last 8760 hours. ?CBG: ?No results for input(s): GLUCAP in the last 168 hours. ? ?Urinalysis ?   ?Component V

## 2022-01-28 NOTE — ED Notes (Signed)
RN removed oxygen to see if pt oxygen saturation would remain normal. Pt drops to 80-85% on RA. Placed back on oxygen.  ?

## 2022-01-29 ENCOUNTER — Inpatient Hospital Stay: Payer: Medicare HMO

## 2022-01-29 DIAGNOSIS — N3000 Acute cystitis without hematuria: Secondary | ICD-10-CM

## 2022-01-29 DIAGNOSIS — S42213A Unspecified displaced fracture of surgical neck of unspecified humerus, initial encounter for closed fracture: Secondary | ICD-10-CM | POA: Diagnosis not present

## 2022-01-29 DIAGNOSIS — R9389 Abnormal findings on diagnostic imaging of other specified body structures: Secondary | ICD-10-CM

## 2022-01-29 DIAGNOSIS — R0902 Hypoxemia: Secondary | ICD-10-CM | POA: Diagnosis not present

## 2022-01-29 LAB — BASIC METABOLIC PANEL
Anion gap: 6 (ref 5–15)
BUN: 23 mg/dL (ref 8–23)
CO2: 30 mmol/L (ref 22–32)
Calcium: 9 mg/dL (ref 8.9–10.3)
Chloride: 101 mmol/L (ref 98–111)
Creatinine, Ser: 0.92 mg/dL (ref 0.44–1.00)
GFR, Estimated: >60 mL/min (ref >60)
Glucose, Bld: 117 mg/dL — ABNORMAL HIGH (ref 70–99)
Potassium: 4.4 mmol/L (ref 3.5–5.1)
Sodium: 137 mmol/L (ref 135–145)

## 2022-01-29 LAB — CBC
HCT: 46.8 % — ABNORMAL HIGH (ref 36.0–46.0)
Hemoglobin: 14.6 g/dL (ref 12.0–15.0)
MCH: 28 pg (ref 26.0–34.0)
MCHC: 31.2 g/dL (ref 30.0–36.0)
MCV: 89.8 fL (ref 80.0–100.0)
Platelets: 292 10*3/uL (ref 150–400)
RBC: 5.21 MIL/uL — ABNORMAL HIGH (ref 3.87–5.11)
RDW: 14.9 % (ref 11.5–15.5)
WBC: 14.8 10*3/uL — ABNORMAL HIGH (ref 4.0–10.5)
nRBC: 0 % (ref 0.0–0.2)

## 2022-01-29 LAB — GLUCOSE, CAPILLARY
Glucose-Capillary: 125 mg/dL — ABNORMAL HIGH (ref 70–99)
Glucose-Capillary: 128 mg/dL — ABNORMAL HIGH (ref 70–99)
Glucose-Capillary: 163 mg/dL — ABNORMAL HIGH (ref 70–99)
Glucose-Capillary: 143 mg/dL — ABNORMAL HIGH (ref 70–99)

## 2022-01-29 LAB — PROCALCITONIN: Procalcitonin: <0.1 ng/mL

## 2022-01-29 LAB — MRSA NEXT GEN BY PCR, NASAL: MRSA by PCR Next Gen: DETECTED — AB

## 2022-01-29 LAB — HEMOGLOBIN A1C
Hgb A1c MFr Bld: 6.4 % — ABNORMAL HIGH (ref 4.8–5.6)
Mean Plasma Glucose: 136.98 mg/dL

## 2022-01-29 MED ORDER — MUPIROCIN 2 % EX OINT
1.0000 "application " | TOPICAL_OINTMENT | Freq: Two times a day (BID) | CUTANEOUS | Status: DC
  Administered 2022-01-29 – 2022-01-31 (×4): 1 via NASAL
  Filled 2022-01-29: qty 22

## 2022-01-29 MED ORDER — IOHEXOL 350 MG/ML SOLN
75.0000 mL | Freq: Once | INTRAVENOUS | Status: AC | PRN
  Administered 2022-01-29: 75 mL via INTRAVENOUS

## 2022-01-29 MED ORDER — CEFDINIR 300 MG PO CAPS: 300.0000 mg | ORAL_CAPSULE | Freq: Two times a day (BID) | ORAL | Status: DC

## 2022-01-29 MED ORDER — TROLAMINE SALICYLATE 10 % EX CREA
TOPICAL_CREAM | Freq: Two times a day (BID) | CUTANEOUS | Status: DC | PRN
  Filled 2022-01-29: qty 85

## 2022-01-29 MED ORDER — VANCOMYCIN HCL 1500 MG/300ML IV SOLN: 1500.0000 mg | INTRAVENOUS | Status: DC

## 2022-01-29 MED ORDER — CHLORHEXIDINE GLUCONATE CLOTH 2 % EX PADS
6.0000 | MEDICATED_PAD | Freq: Every day | CUTANEOUS | Status: DC
Start: 2022-01-29 — End: 2022-02-02
  Administered 2022-01-29 – 2022-02-01 (×3): 6 via TOPICAL

## 2022-01-29 MED ORDER — OXYCODONE-ACETAMINOPHEN 5-325 MG PO TABS
1.0000 | ORAL_TABLET | ORAL | Status: DC | PRN
  Administered 2022-01-29 (×2): 1 via ORAL
  Administered 2022-01-29: 2 via ORAL
  Administered 2022-01-30: 1 via ORAL
  Filled 2022-01-29 (×2): qty 1
  Filled 2022-01-29: qty 2
  Filled 2022-01-29: qty 1

## 2022-01-29 MED ORDER — CEPHALEXIN 500 MG PO CAPS
500.0000 mg | ORAL_CAPSULE | Freq: Three times a day (TID) | ORAL | Status: DC
  Administered 2022-01-29 – 2022-01-31 (×9): 500 mg via ORAL
  Filled 2022-01-29 (×9): qty 1

## 2022-01-29 MED ORDER — VANCOMYCIN HCL 2000 MG/400ML IV SOLN
2000.0000 mg | Freq: Once | INTRAVENOUS | Status: AC
  Administered 2022-01-29: 2000 mg via INTRAVENOUS
  Filled 2022-01-29 (×2): qty 400

## 2022-01-29 NOTE — Plan of Care (Signed)

## 2022-01-29 NOTE — Progress Notes (Signed)
Pharmacy Antibiotic Note ? ?April [name] is a 76 y.o. female admitted on 01/28/2022 with pneumonia.  Pharmacy has been consulted for Vancomycin dosing. ? ?Plan: ?Vancomycin 2000 mg x 1 ?Vancomycin 1500 mg IV Q 24 hrs.  ?Goal AUC 400-550. ?Expected AUC: 485.4 ?SCr used: 0.8 (Lab: 0.72), Vd used: 0.6, BMI: 35.5 ? ?Pharmacy will continue to follow and will adjust dosing if warranted. ? ? ?Height: 5\' 6"  (167.6 cm) ?Weight: 99.8 kg (220 lb) ?IBW/kg (Calculated) : 59.3 ? ?Temp (24hrs), Avg:98.3 ?F (36.8 ?C), Min:98.2 ?F (36.8 ?C), Max:98.4 ?F (36.9 ?C) ? ?Recent Labs  ?Lab 01/28/22 ?1125  ?WBC 15.6*  ?CREATININE 0.72  ?  ?Estimated Creatinine Clearance: 72.4 mL/min (by C-G formula based on SCr of 0.72 mg/dL).   ? ?Allergies  ?Allergen Reactions  ? Prednisone Anaphylaxis  ? Other   ? ? ?Antimicrobials this admission: ?5/05 Ceftriaxone >>  ?5/05 Azithromycin >>  ?5/06 Vancomycin >> ? ?Microbiology results: ?5/05 MRSA PCR: Detected ? ?Thank you for allowing pharmacy to be a part of this patient?s care. ? ?Renda Rolls, PharmD, MBA ?01/29/2022 ?4:53 AM ? ? ?

## 2022-01-29 NOTE — Consult Note (Signed)
?ORTHOPAEDIC CONSULTATION ? ?REQUESTING PHYSICIAN: Fritzi Mandes, MD ? ?Chief Complaint: Right proximal humerus fracture ? ?HPI: ?April [name] is a 76 y.o. female who complains of right shoulder pain after a fall at home.  Patient was seen in the ER and x-rays were obtained.  Patient was placed in a sling.  She was admitted due to difficulty with ambulation and oxygen requirement. ? ?Orthopedics was consulted regarding management of the right proximal humerus fracture. ? ?PMH notable for DM 2, HTN, PVD, chronic leukocytosis . ? ?Past Medical History:  ?Diagnosis Date  ? Acute renal failure (ARF) (HCC)   ? Difficult intubation   ? Hypertension   ? NSTEMI (non-ST elevated myocardial infarction) ([place])   ? Paroxysmal atrial fibrillation (HCC)   ? Renal disorder   ? Sepsis [facility]) January 2017  ? [place]  ? Type 2 diabetes mellitus ([place])   ? ?Past Surgical History:  ?Procedure Laterality Date  ? broken hip  2019  ? CYSTOSCOPY WITH STENT PLACEMENT Right 11/03/2015  ? Procedure: CYSTOSCOPY WITH STENT PLACEMENT;  Surgeon: Hollice Espy, MD;  Location: [facility] ORS;  Service: Urology;  Laterality: Right;  ? FRACTURE SURGERY Right   ? Elbow  ? INTRAMEDULLARY (IM) NAIL INTERTROCHANTERIC Right 02/07/2019  ? Procedure: INTRAMEDULLARY (IM) NAIL INTERTROCHANTRIC;  Surgeon: Thornton Park, MD;  Location: [facility] ORS;  Service: Orthopedics;  Laterality: Right;  ? TONSILLECTOMY    ? 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  ? Procedure: TRANSMETATARSAL AMPUTATION;  Surgeon: Algernon Huxley, MD;  Location: [facility] ORS;  Service: Vascular;  Laterality: Bilateral;  ? ?Social History  ? ?Socioeconomic History  ? Marital status: Single  ?  Spouse name: Not on file  ? Number of children: Not on file  ? Years of education: Not on file  ? Highest education level: Not on file  ?Occupational History  ? Not on file  ?Tobacco Use  ? Smoking status: Never  ? Smokeless tobacco: Never  ?Substance and Sexual Activity  ? Alcohol use: No  ?  Alcohol/week: 0.0  standard drinks  ? Drug use: No  ? Sexual activity: Not on file  ?Other Topics Concern  ? Not on file  ?Social History Narrative  ? Not on file  ? ?Social Determinants of Health  ? ?Financial Resource Strain: Not on file  ?Food Insecurity: Not on file  ?Transportation Needs: Not on file  ?Physical Activity: Not on file  ?Stress: Not on file  ?Social Connections: Not on file  ? ?Family History  ?Problem Relation Age of Onset  ? Diabetes Father   ? Heart disease Father   ? Stroke Maternal Grandmother   ? ?Allergies  ?Allergen Reactions  ? Prednisone Anaphylaxis  ? Other   ? ?Prior to Admission medications   ?Medication Sig Start Date End Date Taking? Authorizing Provider  ?empagliflozin (JARDIANCE) 10 MG TABS tablet Take by mouth daily.   Yes [provider]  ?acetaminophen (TYLENOL) 500 MG tablet Take 1,000 mg by mouth every 8 (eight) hours as needed for moderate pain. Reported on 12/16/2015    [provider]  ?aspirin EC 81 MG EC tablet Take 1 tablet (81 mg total) by mouth daily. ?Patient not taking: Reported on 07/13/2021 03/04/16   Algernon Huxley, MD  ?diclofenac sodium (VOLTAREN) 1 % GEL Apply 4 g topically 4 (four) times daily.    [provider]  ?docusate sodium (COLACE) 100 MG capsule Take 1 capsule (100 mg total) by mouth 2 (two) times daily. 12/10/15  Dustin Flock, MD  ?enoxaparin (LOVENOX) 40 MG/0.4ML injection Inject 0.4 mLs (40 mg total) into the skin every 12 (twelve) hours for 28 days. 02/12/19 03/12/19  Dustin Flock, MD  ?insulin glargine (LANTUS) 100 UNIT/ML injection Inject 0.15 mLs (15 Units total) into the skin daily. ?Patient not taking: Reported on 07/13/2021 11/09/15   Henreitta Leber, MD  ?meloxicam (MOBIC) 15 MG tablet Take 15 mg by mouth as needed.  ?Patient not taking: Reported on 01/28/2022 02/01/19   [provider]  ?metFORMIN (GLUCOPHAGE) 500 MG tablet Take 500 mg by mouth 2 (two) times daily.  09/13/17   [provider]  ?metoprolol tartrate  (LOPRESSOR) 25 MG tablet Take 1 tablet (25 mg total) by mouth 2 (two) times daily. 11/09/15   Henreitta Leber, MD  ?oxyCODONE (OXY IR/ROXICODONE) 5 MG immediate release tablet Take 1 tablet (5 mg total) by mouth every 6 (six) hours as needed for moderate pain or severe pain. 02/12/19   Dustin Flock, MD  ?polyethylene glycol (MIRALAX / GLYCOLAX) packet Take 17 g by mouth daily. ?Patient taking differently: Take 17 g by mouth daily as needed. 12/10/15   Dustin Flock, MD  ?pravastatin (PRAVACHOL) 20 MG tablet  12/05/16   [provider]  ?pravastatin (PRAVACHOL) 40 MG tablet Take 40 mg by mouth daily. 11/23/21   [provider]  ?pregabalin (LYRICA) 75 MG capsule Take 1 capsule (75 mg total) by mouth 2 (two) times daily. 08/17/21   Kris Hartmann, NP  ?tamsulosin (FLOMAX) 0.4 MG CAPS capsule Take 1 capsule (0.4 mg total) by mouth daily. ?Patient not taking: Reported on 07/13/2021 12/10/15   Dustin Flock, MD  ?traMADol Veatrice Bourbon) 50 MG tablet  06/10/19   [provider]  ?traZODone (DESYREL) 50 MG tablet Take 50 mg by mouth at bedtime. ?Patient not taking: Reported on 07/13/2021    [provider]  ? ?DG Chest 1 View ? ?Result Date: 01/28/2022 ?CLINICAL DATA:  Hypoxia EXAM: CHEST  1 VIEW COMPARISON:  Chest x-ray dated Feb 09, 2019 FINDINGS: Cardiac and mediastinal contours within normal limits. Indeterminate nodular opacity of the right mid lung. Bibasilar opacities, likely due to atelectasis. No focal consolidation. Old left-sided rib fractures. IMPRESSION: 1. No focal consolidation. 2. Indeterminate nodular opacity of the right mid lung. Recommend CT for further evaluation. Electronically Signed   By: Yetta Glassman M.D.   On: 01/28/2022 18:02  ? ?DG Shoulder Right ? ?Result Date: 01/28/2022 ?CLINICAL DATA:  Right shoulder pain after fall. EXAM: RIGHT SHOULDER - 2+ VIEW COMPARISON:  None Available. FINDINGS: Moderately displaced proximal right humeral neck fracture is noted.  Visualized ribs are unremarkable. IMPRESSION: Moderately displaced proximal right humeral neck fracture. Electronically Signed   By: Marijo Conception M.D.   On: 01/28/2022 12:14  ? ?CT Head Wo Contrast ? ?Result Date: 01/28/2022 ?CLINICAL DATA:  Fall with head and neck trauma.  Shoulder pain. EXAM: CT HEAD WITHOUT CONTRAST CT CERVICAL SPINE WITHOUT CONTRAST TECHNIQUE: Multidetector CT imaging of the head and cervical spine was performed following the standard protocol without intravenous contrast. Multiplanar CT image reconstructions of the cervical spine were also generated. RADIATION DOSE REDUCTION: This exam was performed according to the departmental dose-optimization program which includes automated exposure control, adjustment of the mA and/or kV according to patient size and/or use of iterative reconstruction technique. COMPARISON:  CT head 02/05/2019. Cervical spine CT 10/20/2015 unavailable. FINDINGS: CT HEAD FINDINGS Brain: There is no evidence of acute intracranial hemorrhage, mass lesion, brain edema  or extra-axial fluid collection. The ventricles and subarachnoid spaces are appropriately sized for age. There is no CT evidence of acute cortical infarction. Vascular: Mild intracranial atherosclerosis. No hyperdense vessel identified. Skull: Negative for fracture or focal lesion. Sinuses/Orbits: The visualized paranasal sinuses and mastoid air cells are clear. No orbital abnormalities are seen. Other: None. CT CERVICAL SPINE FINDINGS Alignment: Physiologic. Skull base and vertebrae: No evidence of acute cervical spine fracture or traumatic subluxation. Soft tissues and spinal canal: No prevertebral fluid or swelling. No visible canal hematoma. Bilateral carotid atherosclerosis. Ossification of the ligamentum nuchae. Disc levels: Multilevel cervical spondylosis with disc space narrowing and uncinate spurring, greatest at C5-6 where there is resulting moderate osseous foraminal narrowing bilaterally. Upper  chest: Mild emphysematous changes at the lung apices. There is a left apical ground-glass density measuring 9 x 6 mm on image 96/6. Other: None. IMPRESSION: 1. No acute intracranial or calvarial findings. 2. No evidence

## 2022-01-29 NOTE — Evaluation (Signed)
Physical Therapy Evaluation ?Patient Details ?Name: April [name] ?MRN: [id number] ?DOB: 1946/07/21 ?Today's Date: 01/29/2022 ? ?History of Present Illness ? Patient is a 76 year old female with a PMH (+) for DM II, acute renal failure, and HTN who reports to [facility] ED following a fall when taking out her trash. ?  ?Clinical Impression ? Physical Therapy Evaluation completed this date. Patient tolerated session fairly, with pain reported at 6/10 in right shoulder. Patient also reported increased soreness in her R knee. Patient states she lives in a private residence by herself with 5 steps to enter and bilateral hand rails. Patient at baseline ambulated with a rollator/ RW with multiple placed throughout the house. Patient does have a hx of falling. Due to patients RUE being in a sling/ in pain, RUE ROM and strength was unable to be assessed. LUE was [facility]. Decreased BLE strength noted at 3to3+/5. Patient required MOD A for all bed mobility, with assistance through HHA to pull into sitting, and assistance at BLEs. MAX A +2 required to reposition patient in bed. Cueing required for sit to stands with RW, and required MIN A from an elevated surface. In standing patient reported increased pain, however ambulated 2 steps forward with LUE support and NWB on RUE on RW at [facility]. Upon returning to EOB, patient requested to sit due to increased pain, stating "please do not make me get up again". Patient was returned to bed with all needs met and in reach. Patient would benefit from continued skilled physical therapy in order to optimize patient's safety and return to PLOF. Recommend STR upon discharge from acute hospitalization.  ?   ? ?Recommendations for follow up therapy are one component of a multi-disciplinary discharge planning process, led by the attending physician.  Recommendations may be updated based on patient status, additional functional criteria and insurance authorization. ? ?Follow Up Recommendations Skilled  nursing-short term rehab (<3 hours/day) ? ?  ?Assistance Recommended at Discharge Frequent or constant Supervision/Assistance  ?Patient can return home with the following ? A lot of help with bathing/dressing/bathroom;A lot of help with walking and/or transfers;Help with stairs or ramp for entrance;Assistance with cooking/housework ? ?  ?Equipment Recommendations Other (comment) (TBD)  ?Recommendations for Other Services ?    ?  ?Functional Status Assessment Patient has had a recent decline in their functional status and demonstrates the ability to make significant improvements in function in a reasonable and predictable amount of time.  ? ?  ?Precautions / Restrictions Precautions ?Precautions: Fall ?Restrictions ?Weight Bearing Restrictions: No  ? ?  ? ?Mobility ? Bed Mobility ?Overal bed mobility: Needs Assistance ?Bed Mobility: Supine to Sit, Sit to Supine ?  ?  ?Supine to sit: Mod assist (assistance from HHA to pull into sitting) ?Sit to supine: Mod assist ?  ?General bed mobility comments: assistance at BLEs for all bed mobility ?  ? ?Transfers ?Overall transfer level: Needs assistance ?Equipment used: Rolling walker (2 wheels) ?Transfers: Sit to/from Stand ?Sit to Stand: Min assist, From elevated surface ?  ?  ?  ?  ?  ?General transfer comment: cueing for hand placement ?  ? ?Ambulation/Gait ?Ambulation/Gait assistance: Min guard ?Gait Distance (Feet): 4 Feet (2 steps forward 2 steps back) ?Assistive device: Rolling walker (2 wheels) ?Gait Pattern/deviations: Step-to pattern ?Gait velocity: decreased ?  ?  ?General Gait Details: patient ambualted 2 steps forward 2 steps back before requesting to sit back down "and dont make me get up again" due to pain ? ?Stairs ?  ?  ?  ?  ?  ? ?  Wheelchair Mobility ?  ? ?Modified Rankin (Stroke Patients Only) ?  ? ?  ? ?Balance Overall balance assessment: Needs assistance ?Sitting-balance support: Single extremity supported, Feet supported ?Sitting balance-Leahy Scale:  Fair ?  ?  ?Standing balance support: Bilateral upper extremity supported, During functional activity, Reliant on assistive device for balance ?Standing balance-Leahy Scale: Fair ?  ?  ?  ?  ?  ?  ?  ?  ?  ?  ?  ?  ?   ? ? ? ?Pertinent Vitals/Pain Pain Assessment ?Pain Assessment: 0-10 ?Pain Score: 6  ?Pain Location: R shoulder ?Pain Descriptors / Indicators: Aching ?Pain Intervention(s): Limited activity within patient's tolerance, Monitored during session, Repositioned  ? ? ?Home Living Family/patient expects to be discharged to:: Private residence ?Living Arrangements: Alone ?Available Help at Discharge: Friend(s);Personal care attendant (home aide that comes 1 day per week) ?Type of Home: House ?Home Access: Stairs to enter ?Entrance Stairs-Rails: Right;Left;Can reach both ?Entrance Stairs-Number of Steps: 5 steps in the back, 1 step on the side (uses back steps the most) ?  ?Home Layout: One level ?Home Equipment: Conservation officer, nature (2 wheels);Cane - single point;Rollator (4 wheels);Grab bars - tub/shower;Adaptive equipment ?   ?  ?Prior Function   ?  ?  ?  ?  ?  ?  ?  ?  ?  ? ? ?Hand Dominance  ? Dominant Hand: Right ? ?  ?Extremity/Trunk Assessment  ? Upper Extremity Assessment ?Upper Extremity Assessment: RUE deficits/detail;LUE deficits/detail ?RUE Deficits / Details: patient in sling ?RUE: Unable to fully assess due to pain ?LUE Deficits / Details: WFL ?  ? ?Lower Extremity Assessment ?Lower Extremity Assessment: Generalized weakness (at least 3+/5 strength bilaterally) ?  ? ?   ?Communication  ? Communication: No difficulties  ?Cognition Arousal/Alertness: Awake/alert ?Behavior During Therapy: [facility] for tasks assessed/performed ?Overall Cognitive Status: Within Functional Limits for tasks assessed ?  ?  ?  ?  ?  ?  ?  ?  ?  ?  ?  ?  ?  ?  ?  ?  ?General Comments: A&Ox3 self location and situation ?  ?  ? ?  ?General Comments   ? ?  ?Exercises Other Exercises ?Other Exercises: patient educated on role of PT in  acute care setting, fall risk, and d/c recommendations  ? ?Assessment/Plan  ?  ?PT Assessment Patient needs continued PT services  ?PT Problem List Decreased strength;Decreased mobility;Decreased activity tolerance;Decreased balance;Pain;Decreased skin integrity ? ?   ?  ?PT Treatment Interventions DME instruction;Therapeutic activities;Therapeutic exercise;Gait training;Balance training;Stair training   ? ?PT Goals (Current goals can be found in the Care Plan section)  ?Acute Rehab PT Goals ?Patient Stated Goal: to stop being in pain ?PT Goal Formulation: With patient ?Time For Goal Achievement: 02/12/22 ?Potential to Achieve Goals: Fair ? ?  ?Frequency Min 2X/week ?  ? ? ?Co-evaluation   ?  ?  ?  ?  ? ? ?  ?AM-PAC PT "6 Clicks" Mobility  ?Outcome Measure Help needed turning from your back to your side while in a flat bed without using bedrails?: A Lot ?Help needed moving from lying on your back to sitting on the side of a flat bed without using bedrails?: A Lot ?Help needed moving to and from a bed to a chair (including a wheelchair)?: A Lot ?Help needed standing up from a chair using your arms (e.g., wheelchair or bedside chair)?: A Lot ?Help needed to walk in hospital room?: A Lot ?Help needed  climbing 3-5 steps with a railing? : Total ?6 Click Score: 11 ? ?  ?End of Session Equipment Utilized During Treatment: Gait belt ?Activity Tolerance: Patient tolerated treatment well;Patient limited by pain ?Patient left: in bed;with call bell/phone within reach;with bed alarm set ?Nurse Communication: Mobility status ?PT Visit Diagnosis: Unsteadiness on feet (R26.81);Muscle weakness (generalized) (M62.81);Difficulty in walking, not elsewhere classified (R26.2);Pain;History of falling (Z91.81) ?Pain - Right/Left: Right ?Pain - part of body: Shoulder ?  ? ?Time: 8786-7672 ?PT Time Calculation (min) (ACUTE ONLY): 20 min ? ? ?Charges:   PT Evaluation ?$PT Eval Moderate Complexity: 1 Mod ?  ?  ?   ? ?Iva Boop, PT   ?01/29/22. 9:53 AM ? ? ?

## 2022-01-29 NOTE — [place] FL2 (Signed)
?[place] MEDICAID FL2 LEVEL OF CARE SCREENING TOOL  ?  ? ?IDENTIFICATION  ?Patient Name: ?April [name] Birthdate: 1946/09/20 Sex: female Admission Date (Current Location): ?01/28/2022  ?[place] and [place] Number: ? [place] ?  Facility and Address:  ?[facility], [address], [place], [place] [address] ?     Provider Number: ?[id number]  ?Attending Physician Name and Address:  ?Fritzi Mandes, MD ? Relative Name and Phone Number:  ?Lenon Ahmadi) [phone] ?   ?Current Level of Care: ?Hospital Recommended Level of Care: ?[place] Prior Approval Number: ?  ? ?Date Approved/Denied: ?  PASRR Number: ?[id number] A ? ?Discharge Plan: ?SNF ?  ? ?Current Diagnoses: ?Patient Active Problem List  ? Diagnosis Date Noted  ? Closed displaced fracture of surgical neck of humerus, unspecified fracture morphology, initial encounter   ? Acute cystitis without hematuria   ? Abnormal CT of the chest   ? Hypoxia 01/28/2022  ? Lymphedema 07/02/2019  ? S/P right hip fracture 02/05/2019  ? PAD (peripheral artery disease) ([place]) 10/03/2017  ? Essential hypertension 10/03/2017  ? Pain in limb 03/21/2017  ? Swelling of limb 10/18/2016  ? Ulcer of calf ([place]) 10/18/2016  ? Gangrene of foot ([place]) 03/03/2016  ? UPJ (ureteropelvic junction) obstruction   ? Paroxysmal atrial fibrillation ([place]) 12/04/2015  ? Back pain 12/04/2015  ? Thrombocytopenia ([place]) 12/03/2015  ? Leukocytosis 12/03/2015  ? Type 2 diabetes mellitus ([place]) 12/03/2015  ? Respiratory failure ([place])   ? Proteus septicemia ([place])   ? Acute renal failure (ARF) (HCC)   ? Hydronephrosis   ? Endotracheally intubated   ? Respiratory distress   ? Arterial hypotension   ? Sepsis ([place]) 10/20/2015  ? Acute renal failure ([place])   ? Altered mental status   ? NSTEMI (non-ST elevated myocardial infarction) ([place])   ? Sepsis due to urinary tract infection ([place])   ? ? ?Orientation RESPIRATION BLADDER Height & Weight   ?  ?Self, Time, Situation, Place ?  Normal Incontinent Weight: 220 lb (99.8 kg) ?Height:  5\' 6"  (167.6 cm)  ?BEHAVIORAL SYMPTOMS/MOOD NEUROLOGICAL BOWEL NUTRITION STATUS  ?    Continent Diet (Patient is diabetic)  ?AMBULATORY STATUS COMMUNICATION OF NEEDS Skin   ?Extensive Assist Verbally Normal ?  ?  ?  ?    ?     ?     ? ? ?Personal Care Assistance Level of Assistance  ?Bathing, Feeding, Dressing Bathing Assistance: Maximum assistance ?Feeding assistance: Limited assistance ?Dressing Assistance: Maximum assistance ?   ? ?Functional Limitations Info  ?Sight, Hearing, Speech Sight Info: Adequate ?Hearing Info: Adequate ?Speech Info: Adequate  ? ? ?SPECIAL CARE FACTORS FREQUENCY  ?    ?  ?  ?  ?  ?  ?  ?   ? ? ?Contractures Contractures Info: Not present  ? ? ?Additional Factors Info  ?Code Status, Allergies Code Status Info: Full ?Allergies Info: Prednisone, Anaphylaxis ?  ?  ?  ?   ? ?Current Medications (01/29/2022):  This is the current hospital active medication list ?Current Facility-Administered Medications  ?Medication Dose Route Frequency Provider Last Rate Last Admin  ? acetaminophen (TYLENOL) tablet 650 mg  650 mg Oral Q6H PRN Vashti Hey, MD      ? Or  ? acetaminophen (TYLENOL) suppository 650 mg  650 mg Rectal Q6H PRN Vashti Hey, MD      ? cephALEXin (KEFLEX) capsule 500 mg  500 mg Oral TID Fritzi Mandes, MD   500  mg at 01/29/22 1320  ? Chlorhexidine Gluconate Cloth 2 % PADS 6 each  6 each Topical Q0600 Sharion Settler, NP   6 each at 01/29/22 0516  ? docusate sodium (COLACE) capsule 100 mg  100 mg Oral BID Bonnell Public Tublu, MD   100 mg at 01/29/22 9892  ? enoxaparin (LOVENOX) injection 50 mg  0.5 mg/kg Subcutaneous Q24H Bonnell Public Tublu, MD   50 mg at 01/28/22 2043  ? insulin aspart (novoLOG) injection 0-15 Units  0-15 Units Subcutaneous TID WC Vashti Hey, MD   2 Units at 01/29/22 1212  ? metoprolol tartrate (LOPRESSOR) tablet 25 mg  25 mg Oral BID Bonnell Public Tublu, MD    25 mg at 01/28/22 2234  ? mupirocin ointment (BACTROBAN) 2 % 1 application.  1 application. Nasal BID Sharion Settler, NP   1 application. at 01/29/22 0940  ? ondansetron (ZOFRAN) tablet 4 mg  4 mg Oral Q6H PRN Vashti Hey, MD      ? Or  ? ondansetron (ZOFRAN) injection 4 mg  4 mg Intravenous Q6H PRN Vashti Hey, MD      ? oxyCODONE-acetaminophen (PERCOCET/ROXICET) 5-325 MG per tablet 1-2 tablet  1-2 tablet Oral Q4H PRN Fritzi Mandes, MD   1 tablet at 01/29/22 1320  ? polyethylene glycol (MIRALAX / GLYCOLAX) packet 17 g  17 g Oral Daily PRN Vashti Hey, MD   17 g at 01/28/22 2302  ? pravastatin (PRAVACHOL) tablet 40 mg  40 mg Oral Daily Bonnell Public Tublu, MD   40 mg at 01/29/22 1194  ? pregabalin (LYRICA) capsule 75 mg  75 mg Oral BID Vashti Hey, MD   75 mg at 01/29/22 1740  ? trolamine salicylate (ASPERCREME) 10 % cream   Topical BID PRN Sharion Settler, NP   Given at 01/29/22 0130  ? ? ? ?Discharge Medications: ?Please see discharge summary for a list of discharge medications. ? ?Relevant Imaging Results: ? ?Relevant Lab Results: ? ? ?Additional Information ?SSN# [id number] ? ?Raina Mina, LCSWA ? ? ? ? ?

## 2022-01-29 NOTE — Evaluation (Signed)
Occupational Therapy Evaluation ?Patient Details ?Name: April [name] ?MRN: [id number] ?DOB: [birth year] ?Today's Date: 01/29/2022 ? ? ?History of Present Illness Pt is a 76 year old female admitted after a fall taking out her trash 01/28/22. Pt with R humerus fracture, UTI. PMH significant for DM 2, HTN, PVD, chronic leukocytosis of unclear etiology.  ? ?Clinical Impression ?  ?Chart reviewed, pt greeted in bed agreeable to OT evaluation. PTA pt reports MOD I in functional mobility with intermittent use of device (SPC, rollator or RW), MOD I in ADL, has intermittent assist for IADL with an aid 1x per week and neighbors/friends assist as needed. Pt requires MOD A for bed mobility, MIN A for STS from raised surface, short amb transfer via HHA with MIN A from bed to bedside chair. MAX A required for UB dressing for both sling and gown with education provided to compensatory techniques for dressing. Pt is performing mobility/ADL below PLOF and presents with deficits in strength, endurance, activity tolerance, RUE function all affecting optimal and safe Adl task completion. Pt would benefit form discharge to STR to address deficits at this time. Pt is left in bedside chair inc are of RN, NAD, all needs met. OT will follow acutely.  ?   ? ?Recommendations for follow up therapy are one component of a multi-disciplinary discharge planning process, led by the attending physician.  Recommendations may be updated based on patient status, additional functional criteria and insurance authorization.  ? ?Follow Up Recommendations ? Skilled nursing-short term rehab (<3 hours/day)  ?  ?Assistance Recommended at Discharge Intermittent Supervision/Assistance  ?Patient can return home with the following A lot of help with bathing/dressing/bathroom;A lot of help with walking and/or transfers;Assistance with cooking/housework;Assist for transportation;Help with stairs or ramp for entrance;Direct supervision/assist for medications  management ? ?  ?Functional Status Assessment ? Patient has had a recent decline in their functional status and demonstrates the ability to make significant improvements in function in a reasonable and predictable amount of time.  ?Equipment Recommendations ? None recommended by OT;Other (comment) (pt has bsc and shower chair)  ?  ?Recommendations for Other Services   ? ? ?  ?Precautions / Restrictions Precautions ?Precautions: Fall ?Precaution Comments: watch spo2 ?Required Braces or Orthoses: Sling ?Restrictions ?Weight Bearing Restrictions: Yes ?RUE Weight Bearing: Non weight bearing ?RLE Weight Bearing: Non weight bearing ?Other Position/Activity Restrictions: per ortho note: NWB RUE in sling, okay for pendulum exercises, hand wrist and elbow motion as tolerated  ? ?  ? ?Mobility Bed Mobility ?Overal bed mobility: Needs Assistance ?Bed Mobility: Supine to Sit ?  ?  ?Supine to sit: Mod assist, HOB elevated ?  ?  ?  ?  ? ?Transfers ?Overall transfer level: Needs assistance ?  ?Transfers: Sit to/from Stand ?Sit to Stand: Min assist, From elevated surface ?  ?  ?  ?  ?  ?  ?  ? ?  ?Balance Overall balance assessment: Needs assistance ?Sitting-balance support: Single extremity supported, Feet supported ?Sitting balance-Leahy Scale: Good ?  ?  ?Standing balance support: Single extremity supported, During functional activity ?Standing balance-Leahy Scale: Fair ?  ?  ?  ?  ?  ?  ?  ?  ?  ?  ?  ?  ?   ? ?ADL either performed or assessed with clinical judgement  ? ?ADL Overall ADL's : Needs assistance/impaired ?Eating/Feeding: Sitting;Set up ?Eating/Feeding Details (indicate cue type and reason): with non dominant hand ?Grooming: Wash/dry hands;Sitting;Set up ?  ?  ?  ?  Lower Body Bathing: Maximal assistance;Sit to/from stand ?  ?Upper Body Dressing : Maximal assistance;Sitting ?Upper Body Dressing Details (indicate cue type and reason): gown, vcs for donning RUE first, removing it last from sleeve; sling ?Lower Body  Dressing: Maximal assistance ?Lower Body Dressing Details (indicate cue type and reason): socks ?Toilet Transfer: Minimal assistance;Cueing for safety;Cueing for sequencing ?Toilet Transfer Details (indicate cue type and reason): hand held assist simulated to bedside chair ?Toileting- Clothing Manipulation and Hygiene: Maximal assistance;Sit to/from stand ?  ?  ?  ?Functional mobility during ADLs: Minimal assistance;Cueing for safety (no more than 5 feet) ?   ? ? ? ?Vision Patient Visual Report: No change from baseline ?   ?   ?Perception   ?  ?Praxis   ?  ? ?Pertinent Vitals/Pain Pain Assessment ?Pain Assessment: 0-10 ?Pain Score: 5  ?Pain Location: RUE ?Pain Descriptors / Indicators: Aching, Grimacing, Guarding ?Pain Intervention(s): Limited activity within patient's tolerance, Monitored during session, Repositioned, Patient requesting pain meds-RN notified  ? ? ? ?Hand Dominance Right ?  ?Extremity/Trunk Assessment Upper Extremity Assessment ?Upper Extremity Assessment: RUE deficits/detail;Generalized weakness ?RUE Deficits / Details: Pt in sling, guarding and protective of RUE due to significant pain with movement, will continue to assess ?RUE: Unable to fully assess due to pain ?LUE Deficits / Details: WFL ?  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  ?  ?  ?Communication Communication ?Communication: No difficulties ?  ?Cognition Arousal/Alertness: Awake/alert ?Behavior During Therapy: [facility] for tasks assessed/performed ?Overall Cognitive Status: Within Functional Limits for tasks assessed ?  ?  ?  ?  ?  ?  ?  ?  ?  ?  ?  ?  ?  ?  ?  ?  ?  ?  ?  ?General Comments    ? ?  ?Exercises Other Exercises ?Other Exercises: educated  re: role of OT, role of rehab, discharge recommendations, falls prevention, safe ADL completion, compensatory techniques for ADL completion ?  ?Shoulder Instructions    ? ? ?Home Living Family/patient expects to be discharged to:: Private residence ?Living Arrangements: Alone ?Available Help at Discharge: Friend(s);Personal care attendant (aid 1 day per week) ?Type of Home: House ?Home Access: Stairs to enter ?Entrance Stairs-Number of Steps: 5 ?Entrance Stairs-Rails: Right;Left;Can reach both ?Home Layout: One level ?  ?  ?Bathroom Shower/Tub: Walk-in shower (approx 2 inch lip) ?  ?Bathroom Toilet: Handicapped height ?  ?  ?Home Equipment: Conservation officer, nature (2 wheels);Cane - single point;Rollator (4 wheels);Grab bars - tub/shower;Adaptive equipment;BSC/3in1;Shower seat;Other (comment) (does not use shower seat she reports she is afraid she wont be able to stand up off of it, has life alert) ?Adaptive Equipment: [place] ?  ?  ? ?  ?Prior Functioning/Environment Prior Level of Function : Independent/Modified Independent;Needs assist ?  ?  ?  ?Physical Assist : ADLs (physical) ?  ?ADLs (physical): IADLs ?Mobility Comments: pt reports intermittent use of rollator or cane for community distances as needed ?ADLs Comments: pt reports MOD I with all ADL, aid or neighbors assist with IADLs as needed ?  ? ?  ?  ?OT Problem List: Decreased strength;Decreased knowledge of use of DME or AE;Decreased range of motion;Decreased activity tolerance;Impaired UE functional use;Impaired balance (sitting and/or standing) ?  ?   ?OT Treatment/Interventions:    ?  ?  OT Goals(Current goals can be found in the care plan section) Acute Rehab OT Goals ?Patient Stated Goal: get stronger ?OT Goal Formulation: With patient ?Time For Goal Achievement: 02/12/22 ?Potential to Achieve Goals: Good ?ADL Goals ?Pt Will Perform Grooming: with modified independence ?Pt Will Perform Upper Body Dressing: with modified independence ?Pt Will Perform Lower Body  Dressing: sit to/from stand;with modified independence ?Pt Will Transfer to Toilet: with modified independence;ambulating ?Pt Will Perform Toileting - Clothing Manipulation and hygiene: with modified independence;sit to/f

## 2022-01-29 NOTE — Progress Notes (Signed)
[place] at [facility] ? ? ?PATIENT NAME: April [name]   ? ?MR#:  [id number] ? ?DATE OF BIRTH:  September 18, 1946 ? ?SUBJECTIVE:  ? ?patient came in after mechanical fall at home while taking her recycle bin to the curb side. She fell on sustained right humerus fracture. She had transient hypoxia in the emergency room. Complains of right shoulder pain. No family at bedside. ? ? ?VITALS:  ?Blood pressure (!) 121/54, pulse 79, temperature 98.4 ?F (36.9 ?C), temperature source Oral, resp. rate 16, height 5\' 6"  (1.676 m), weight 99.8 kg, SpO2 90 %. ? ?PHYSICAL EXAMINATION:  ? ?GENERAL:  76 y.o.-year-old patient lying in the bed with no acute distress.  ?LUNGS: Normal breath sounds bilaterally, no wheezing, rales, rhonchi.  ?CARDIOVASCULAR: S1, S2 normal. No murmurs, rubs, or gallops.  ?ABDOMEN: Soft, nontender, nondistended. Bowel sounds present.  ?EXTREMITIES: No  edema b/l.   Right shoulder sling ?NEUROLOGIC: nonfocal  patient is alert and awake ?SKIN: No obvious rash, lesion, or ulcer.  ? ?LABORATORY PANEL:  ?CBC ?Recent Labs  ?Lab 01/29/22 ?9371  ?WBC 14.8*  ?HGB 14.6  ?HCT 46.8*  ?PLT 292  ? ? ?Chemistries  ?Recent Labs  ?Lab 01/29/22 ?6967  ?NA 137  ?K 4.4  ?CL 101  ?CO2 30  ?GLUCOSE 117*  ?BUN 23  ?CREATININE 0.92  ?CALCIUM 9.0  ? ?Cardiac Enzymes ?No results for input(s): TROPONINI in the last 168 hours. ?RADIOLOGY:  ?DG Chest 1 View ? ?Result Date: 01/28/2022 ?CLINICAL DATA:  Hypoxia EXAM: CHEST  1 VIEW COMPARISON:  Chest x-ray dated Feb 09, 2019 FINDINGS: Cardiac and mediastinal contours within normal limits. Indeterminate nodular opacity of the right mid lung. Bibasilar opacities, likely due to atelectasis. No focal consolidation. Old left-sided rib fractures. IMPRESSION: 1. No focal consolidation. 2. Indeterminate nodular opacity of the right mid lung. Recommend CT for further evaluation. Electronically Signed   By: Yetta Glassman M.D.   On: 01/28/2022 18:02  ? ?DG Shoulder  Right ? ?Result Date: 01/28/2022 ?CLINICAL DATA:  Right shoulder pain after fall. EXAM: RIGHT SHOULDER - 2+ VIEW COMPARISON:  None Available. FINDINGS: Moderately displaced proximal right humeral neck fracture is noted. Visualized ribs are unremarkable. IMPRESSION: Moderately displaced proximal right humeral neck fracture. Electronically Signed   By: Marijo Conception M.D.   On: 01/28/2022 12:14  ? ?CT Head Wo Contrast ? ?Result Date: 01/28/2022 ?CLINICAL DATA:  Fall with head and neck trauma.  Shoulder pain. EXAM: CT HEAD WITHOUT CONTRAST CT CERVICAL SPINE WITHOUT CONTRAST TECHNIQUE: Multidetector CT imaging of the head and cervical spine was performed following the standard protocol without intravenous contrast. Multiplanar CT image reconstructions of the cervical spine were also generated. RADIATION DOSE REDUCTION: This exam was performed according to the departmental dose-optimization program which includes automated exposure control, adjustment of the mA and/or kV according to patient size and/or use of iterative reconstruction technique. COMPARISON:  CT head 02/05/2019. Cervical spine CT 10/20/2015 unavailable. FINDINGS: CT HEAD FINDINGS Brain: There is no evidence of acute intracranial hemorrhage, mass lesion, brain edema or extra-axial fluid collection. The ventricles and subarachnoid spaces are appropriately sized for age. There is no CT evidence of acute cortical infarction. Vascular: Mild intracranial atherosclerosis. No hyperdense vessel identified. Skull: Negative for fracture or focal lesion. Sinuses/Orbits: The visualized paranasal sinuses and mastoid air cells are clear. No orbital abnormalities are seen. Other: None. CT CERVICAL SPINE FINDINGS Alignment: Physiologic. Skull base and vertebrae: No evidence of acute cervical spine fracture or  traumatic subluxation. Soft tissues and spinal canal: No prevertebral fluid or swelling. No visible canal hematoma. Bilateral carotid atherosclerosis. Ossification of  the ligamentum nuchae. Disc levels: Multilevel cervical spondylosis with disc space narrowing and uncinate spurring, greatest at C5-6 where there is resulting moderate osseous foraminal narrowing bilaterally. Upper chest: Mild emphysematous changes at the lung apices. There is a left apical ground-glass density measuring 9 x 6 mm on image 96/6. Other: None. IMPRESSION: 1. No acute intracranial or calvarial findings. 2. No evidence of acute cervical spine fracture, traumatic subluxation or static signs of instability. 3. Cervical spondylosis as described with osseous foraminal narrowing bilaterally at C5-6. 4. Mean diameter 8 mm ground-glass opacity at the left lung apex. Per Fleischner Society Guidelines recommend a non-contrast chest CT at 6-12 months to confirm persistence, then additional non-contrast chest CTs every 2 years until 5 years. If nodule grows or develops solid component(s), consider resection. Electronically Signed   By: Richardean Sale M.D.   On: 01/28/2022 12:09  ? ?CT Angio Chest PE W and/or Wo Contrast ? ?Result Date: 01/29/2022 ?CLINICAL DATA:  Pulmonary embolus EXAM: CT ANGIOGRAPHY CHEST WITH CONTRAST TECHNIQUE: Multidetector CT imaging of the chest was performed using the standard protocol during bolus administration of intravenous contrast. Multiplanar CT image reconstructions and MIPs were obtained to evaluate the vascular anatomy. RADIATION DOSE REDUCTION: This exam was performed according to the departmental dose-optimization program which includes automated exposure control, adjustment of the mA and/or kV according to patient size and/or use of iterative reconstruction technique. CONTRAST:  86mL OMNIPAQUE IOHEXOL 350 MG/ML SOLN COMPARISON:  None Available. FINDINGS: Cardiovascular: Adequate contrast opacification of the pulmonary arteries. No evidence of pulmonary embolus. Normal heart size. No pericardial effusion. Mitral annular calcifications. Mild coronary artery calcifications.  Atherosclerotic disease of the thoracic aorta. Mediastinum/Nodes: Small hiatal hernia. Thyroid is unremarkable. No pathologically enlarged lymph nodes seen in the chest. Lungs/Pleura: Central airways are patent bilateral solid pulmonary nodules. Left lower lobe mass measuring 3.0 x 2.4 cm on series 5, image 71. Additional numerous bilateral solid pulmonary nodules are seen. Reference right upper lobe nodule measuring 9 mm on series 5, image 51. Reference left upper lobe pulmonary nodule measuring 1.3 cm. Bibasilar atelectasis. No consolidation, pleural effusion or pneumothorax. Upper Abdomen: No acute abnormality. Musculoskeletal: Partially visualized proximal right humerus fracture, better evaluated on prior radiograph. Moderate T6 compression deformity with no evidence of retropulsion. No aggressive appearing osseous lesions. Review of the MIP images confirms the above findings. IMPRESSION: 1. No evidence of pulmonary embolus. 2. Left lower lobe pulmonary mass and bilateral solid pulmonary nodules, concerning for metastatic disease. Consider CT of the abdomen and pelvis to assess for primary malignancy. 3. Age indeterminate moderate compression deformity of T6. Correlate for point tenderness. 4. Aortic Atherosclerosis (ICD10-I70.0). Electronically Signed   By: Yetta Glassman M.D.   On: 01/29/2022 11:10  ? ?CT Cervical Spine Wo Contrast ? ?Result Date: 01/28/2022 ?CLINICAL DATA:  Fall with head and neck trauma.  Shoulder pain. EXAM: CT HEAD WITHOUT CONTRAST CT CERVICAL SPINE WITHOUT CONTRAST TECHNIQUE: Multidetector CT imaging of the head and cervical spine was performed following the standard protocol without intravenous contrast. Multiplanar CT image reconstructions of the cervical spine were also generated. RADIATION DOSE REDUCTION: This exam was performed according to the departmental dose-optimization program which includes automated exposure control, adjustment of the mA and/or kV according to patient size  and/or use of iterative reconstruction technique. COMPARISON:  CT head 02/05/2019. Cervical spine CT 10/20/2015 unavailable. FINDINGS: CT HEAD  FINDINGS Brain: There is no evidence of acute intracranial hemorrhage, mass lesi

## 2022-01-29 NOTE — Progress Notes (Signed)
Patient refused CT NP Sharion Settler notified. Patient has agreed to go at 8am for CT.  ?

## 2022-01-29 NOTE — Progress Notes (Signed)
Cross Cover ?Patient admitted with right nodular opacity right middle lung and leukocytosis with elevated monocytes. 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 MRSA PCR now +. Given the nodular opacity and risk factors, vancomycin added to the antibiotic regimen.  ?Follow up CT that was previously ordered was refused by patient but she agreed to do it at 0800 am today ?

## 2022-01-30 DIAGNOSIS — N3000 Acute cystitis without hematuria: Secondary | ICD-10-CM | POA: Diagnosis not present

## 2022-01-30 DIAGNOSIS — R0902 Hypoxemia: Secondary | ICD-10-CM | POA: Diagnosis not present

## 2022-01-30 DIAGNOSIS — S42213A Unspecified displaced fracture of surgical neck of unspecified humerus, initial encounter for closed fracture: Secondary | ICD-10-CM | POA: Diagnosis not present

## 2022-01-30 DIAGNOSIS — R918 Other nonspecific abnormal finding of lung field: Secondary | ICD-10-CM

## 2022-01-30 DIAGNOSIS — R9389 Abnormal findings on diagnostic imaging of other specified body structures: Secondary | ICD-10-CM | POA: Diagnosis not present

## 2022-01-30 LAB — GLUCOSE, CAPILLARY
Glucose-Capillary: 130 mg/dL — ABNORMAL HIGH (ref 70–99)
Glucose-Capillary: 135 mg/dL — ABNORMAL HIGH (ref 70–99)
Glucose-Capillary: 123 mg/dL — ABNORMAL HIGH (ref 70–99)
Glucose-Capillary: 161 mg/dL — ABNORMAL HIGH (ref 70–99)

## 2022-01-30 MED ORDER — OXYCODONE HCL 5 MG PO TABS
7.5000 mg | ORAL_TABLET | ORAL | Status: DC | PRN
  Administered 2022-01-30 – 2022-02-02 (×9): 7.5 mg via ORAL
  Filled 2022-01-30 (×9): qty 2

## 2022-01-30 MED ORDER — POLYETHYLENE GLYCOL 3350 17 G PO PACK
17.0000 g | PACK | Freq: Two times a day (BID) | ORAL | Status: DC
  Administered 2022-01-30 – 2022-02-02 (×4): 17 g via ORAL
  Filled 2022-01-30 (×4): qty 1

## 2022-01-30 NOTE — TOC Initial Note (Signed)
Transition of Care (TOC) - Initial/Assessment Note  ? ? ?Patient Details  ?Name: April [name] ?MRN: [id number] ?Date of Birth: 1946-07-20 ? ?Transition of Care (TOC) CM/SW Contact:    ?Raina Mina, LCSWA ?Phone Number: ?01/30/2022, 10:04 AM ? ?Clinical Narrative: Patient agreed with recommendations of SNF placement. CSW started SNF bed search. Patient had no preferences on facilities but would like to remain in [place].                 ? ? ?  ?Barriers to Discharge: Continued Medical Work up ? ? ?Patient Goals and CMS Choice ?Patient states their goals for this hospitalization and ongoing recovery are:: Participate in rehab and return home ?  ?  ? ?Expected Discharge Plan and Services ?  ?  ?  ?  ?Living arrangements for the past 2 months: [place] ?                ?  ?  ?  ?  ?  ?  ?  ?  ?  ?  ? ?Prior Living Arrangements/Services ?Living arrangements for the past 2 months: [place] ?Lives with:: Self ?Patient language and need for interpreter reviewed:: Yes ?Do you feel safe going back to the place where you live?: Yes      ?Need for Family Participation in Patient Care: Yes (Comment) ?Care giver support system in place?: Yes (comment) ?  ?Criminal Activity/Legal Involvement Pertinent to Current Situation/Hospitalization: No - Comment as needed ? ?Activities of Daily Living ?Home Assistive Devices/Equipment: CBG Meter ?ADL Screening (condition at time of admission) ?Patient's cognitive ability adequate to safely complete daily activities?: Yes ?Is the patient deaf or have difficulty hearing?: No ?Does the patient have difficulty seeing, even when wearing glasses/contacts?: No ?Does the patient have difficulty concentrating, remembering, or making decisions?: No ?Patient able to express need for assistance with ADLs?: Yes ?Does the patient have difficulty dressing or bathing?: No ?Independently performs ADLs?: Yes (appropriate for developmental age) ?Does the patient have difficulty walking  or climbing stairs?: Yes ?Weakness of Legs: Both ?Weakness of Arms/Hands: Right ? ?Permission Sought/Granted ?Permission sought to share information with : Family Supports ?Permission granted to share information with : Yes, Verbal Permission Granted ? Share Information with NAME: Dortha Schwalbe ?   ? Permission granted to share info w Relationship: Cousin ? Permission granted to share info w Contact Information: [phone] ? ?Emotional Assessment ?Appearance:: Appears stated age ?Attitude/Demeanor/Rapport: Engaged ?Affect (typically observed): Accepting ?Orientation: : Oriented to Self, Oriented to Place, Oriented to  Time, Oriented to Situation ?Alcohol / Substance Use: Not Applicable ?Psych Involvement: No (comment) ? ?Admission diagnosis:  Hypoxia [R09.02] ?Acute cystitis without hematuria [N30.00] ?Closed displaced fracture of surgical neck of humerus, unspecified fracture morphology, initial encounter [S42.213A] ?Patient Active Problem List  ? Diagnosis Date Noted  ? Closed displaced fracture of surgical neck of humerus, unspecified fracture morphology, initial encounter   ? Acute cystitis without hematuria   ? Abnormal CT of the chest   ? Hypoxia 01/28/2022  ? Lymphedema 07/02/2019  ? S/P right hip fracture 02/05/2019  ? PAD (peripheral artery disease) ([place]) 10/03/2017  ? Essential hypertension 10/03/2017  ? Pain in limb 03/21/2017  ? Swelling of limb 10/18/2016  ? Ulcer of calf ([place]) 10/18/2016  ? Gangrene of foot ([place]) 03/03/2016  ? UPJ (ureteropelvic junction) obstruction   ? Paroxysmal atrial fibrillation ([place]) 12/04/2015  ? Back pain 12/04/2015  ? Thrombocytopenia ([place]) 12/03/2015  ? Leukocytosis 12/03/2015  ?  Type 2 diabetes mellitus ([place]) 12/03/2015  ? Respiratory failure ([place])   ? Proteus septicemia ([place])   ? Acute renal failure (ARF) ([facility])   ? Hydronephrosis   ? Endotracheally intubated   ? Respiratory distress   ? Arterial hypotension   ? Sepsis ([place]) 10/20/2015  ? Acute renal failure ([place])   ? Altered  mental status   ? NSTEMI (non-ST elevated myocardial infarction) ([place])   ? Sepsis due to urinary tract infection ([place])   ? ?PCP:  Albina Billet, MD ?Pharmacy:   ?[id number] - [place], [place] - [address] ?[address] ?[place] [address] ?Phone: [phone] Fax: [phone] ? ?[place], [place] ?[place] ?[place] [address] ?Phone: [phone] Fax: [phone] ? ? ? ? ?Social Determinants of Health (SDOH) Interventions ?  ? ?Readmission Risk Interventions ?   ? View : No data to display.  ?  ?  ?  ? ? ? ?

## 2022-01-30 NOTE — Progress Notes (Signed)
[place] at [facility] ? ? ?PATIENT NAME: April [name]   ? ?MR#:  [id number] ? ?DATE OF BIRTH:  [birth year] ? ?SUBJECTIVE:  ? ?patient came in after mechanical fall at home while taking her recycle bin to the curb side. She fell on sustained right humerus fracture. She had transient hypoxia in the emergency room. Complains of right shoulder pain. No family at bedside. ? ?Pt requesting Orthopedic surgeon come talk with her at bedside ? ? ?VITALS:  ?Blood pressure (!) 128/55, pulse 78, temperature 98.7 ?F (37.1 ?C), resp. rate 18, height 5\' 6"  (1.676 m), weight 99.8 kg, SpO2 95 %. ? ?PHYSICAL EXAMINATION:  ? ?GENERAL:  76 y.o.-year-old patient lying in the bed with no acute distress.  ?LUNGS: Normal breath sounds bilaterally, no wheezing, rales, rhonchi.  ?CARDIOVASCULAR: S1, S2 normal. No murmurs, rubs, or gallops.  ?ABDOMEN: Soft, nontender, nondistended. Bowel sounds present.  ?EXTREMITIES: No  edema b/l.   Right shoulder sling ?NEUROLOGIC: nonfocal  patient is alert and awake ?SKIN: No obvious rash, lesion, or ulcer.  ? ?LABORATORY PANEL:  ?CBC ?Recent Labs  ?Lab 01/29/22 ?6387  ?WBC 14.8*  ?HGB 14.6  ?HCT 46.8*  ?PLT 292  ? ? ? ?Chemistries  ?Recent Labs  ?Lab 01/29/22 ?5643  ?NA 137  ?K 4.4  ?CL 101  ?CO2 30  ?GLUCOSE 117*  ?BUN 23  ?CREATININE 0.92  ?CALCIUM 9.0  ? ? ?Cardiac Enzymes ?No results for input(s): TROPONINI in the last 168 hours. ?RADIOLOGY:  ?DG Chest 1 View ? ?Result Date: 01/28/2022 ?CLINICAL DATA:  Hypoxia EXAM: CHEST  1 VIEW COMPARISON:  Chest x-ray dated Feb 09, 2019 FINDINGS: Cardiac and mediastinal contours within normal limits. Indeterminate nodular opacity of the right mid lung. Bibasilar opacities, likely due to atelectasis. No focal consolidation. Old left-sided rib fractures. IMPRESSION: 1. No focal consolidation. 2. Indeterminate nodular opacity of the right mid lung. Recommend CT for further evaluation. Electronically Signed   By: Yetta Glassman M.D.   On:  01/28/2022 18:02  ? ?CT Angio Chest PE W and/or Wo Contrast ? ?Result Date: 01/29/2022 ?CLINICAL DATA:  Pulmonary embolus EXAM: CT ANGIOGRAPHY CHEST WITH CONTRAST TECHNIQUE: Multidetector CT imaging of the chest was performed using the standard protocol during bolus administration of intravenous contrast. Multiplanar CT image reconstructions and MIPs were obtained to evaluate the vascular anatomy. RADIATION DOSE REDUCTION: This exam was performed according to the departmental dose-optimization program which includes automated exposure control, adjustment of the mA and/or kV according to patient size and/or use of iterative reconstruction technique. CONTRAST:  57mL OMNIPAQUE IOHEXOL 350 MG/ML SOLN COMPARISON:  None Available. FINDINGS: Cardiovascular: Adequate contrast opacification of the pulmonary arteries. No evidence of pulmonary embolus. Normal heart size. No pericardial effusion. Mitral annular calcifications. Mild coronary artery calcifications. Atherosclerotic disease of the thoracic aorta. Mediastinum/Nodes: Small hiatal hernia. Thyroid is unremarkable. No pathologically enlarged lymph nodes seen in the chest. Lungs/Pleura: Central airways are patent bilateral solid pulmonary nodules. Left lower lobe mass measuring 3.0 x 2.4 cm on series 5, image 71. Additional numerous bilateral solid pulmonary nodules are seen. Reference right upper lobe nodule measuring 9 mm on series 5, image 51. Reference left upper lobe pulmonary nodule measuring 1.3 cm. Bibasilar atelectasis. No consolidation, pleural effusion or pneumothorax. Upper Abdomen: No acute abnormality. Musculoskeletal: Partially visualized proximal right humerus fracture, better evaluated on prior radiograph. Moderate T6 compression deformity with no evidence of retropulsion. No aggressive appearing osseous lesions. Review of the MIP images confirms the  above findings. IMPRESSION: 1. No evidence of pulmonary embolus. 2. Left lower lobe pulmonary mass and  bilateral solid pulmonary nodules, concerning for metastatic disease. Consider CT of the abdomen and pelvis to assess for primary malignancy. 3. Age indeterminate moderate compression deformity of T6. Correlate for point tenderness. 4. Aortic Atherosclerosis (ICD10-I70.0). Electronically Signed   By: Yetta Glassman M.D.   On: 01/29/2022 11:10   ? ?Assessment and Plan ?  ?April [name] is an 76 y.o. female DM 2, HTN, PVD, chronic leukocytosis of unclear etiology was in her USO H until earlier today when she had a mechanical fall as she was trying to take the trash out.  Patient had pain in her right arm and presented to the ED for evaluation.  She was noted to have a right humeral fracture which was splinted.  As patient was getting ready to go home, patient was noted to have significant hypoxia with ambulation.  She had no hypoxia at rest. ? ?Hypoxia transient ?abnormal chest x-ray left lung nodule ?-- clinically no evidence of pneumonia. Discontinue IV antibiotic ?-- CT chest no PE. Showed multiple solitary nodules and left lower lobe mass ?-- oncology consultation with Dr. Tasia Catchings appreciated. Pt does not want any further evaluation ?-- results reviewed with patient ? ?right humerus fracture status post mechanical fall ?-- patient will follow-up with Dr. Sharlet Salina as outpatient. ?-- Continue right upper extremity sling ?-- worked with physical therapy recommends rehab ? ?UTI ?-- continue Keflex 500 TID ?-- complains of dysuria/burning ? ?DM II without complication ?-- continue sliding scale insulin ?-- at home takes metformin and jardiance ? ?hypertension continue metoprolol ? ? ? ? ? ? ?Family communication : none ?Consults : oncology, orthopedic ?CODE STATUS: full ?DVT Prophylaxis : enoxaparin ?Level of care: Med-Surg ?Status is: Inpatient ?Remains inpatient appropriate because: right shoulder fracture. TOC for discharge planning to Rehab ?Medically stable ?  ? ?TOTAL TIME TAKING CARE OF THIS PATIENT: 25 minutes.   ?>50% time spent on counselling and coordination of care ? ?Note: This dictation was prepared with Dragon dictation along with smaller phrase technology. Any transcriptional errors that result from this process are unintentional. ? ?Fritzi Mandes M.D  ? ? ?[facility]  ? ?CC: ?Primary care physician; Albina Billet, MD  ?

## 2022-01-30 NOTE — Consult Note (Signed)
? ?Hematology/Oncology Consult note ?[phone] [phone] ? ?  ? ? ?Patient Care Team: ?Albina Billet, MD as PCP - General (Internal Medicine)  ? ?Name of the patient: April [name]  ?[id number]  ?08-22-1946  ? ?Date of visit: 01/30/22 ?REASON FOR COSULTATION:  ?Lung mass ?History of presenting illness-  ?76 y.o. female with PMH listed at below who is currently admitted due to right humerus fracture.  She had a mechanical fall at home while taking her recycling bin to do a curbside.  She also was transiently hypoxia with ambulation. ? ?Chest x-ray showed abnormal lung nodules. ?01/29/2022, CT angio chest PE protocol showed no evidence of pulmonary embolism.  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  Age indeterminate moderate compression deformity of T6.  Aortic atherosclerosis ?Hematology oncology was consulted for evaluation. ?She is a never smoker. ?Denies any unintentional weight loss, night sweats. ?She was found to have a urinary tract infection and is on Keflex currently. ? ?Review of Systems  ?Constitutional:  Negative for chills, fatigue and fever.  ?HENT:   Negative for hearing loss and voice change.   ?Eyes:  Negative for eye problems.  ?Respiratory:  Negative for chest tightness and cough.   ?Cardiovascular:  Negative for chest pain.  ?Gastrointestinal:  Negative for abdominal distention, abdominal pain and blood in stool.  ?Endocrine: Negative for hot flashes.  ?Genitourinary:  Negative for difficulty urinating and frequency.   ?Musculoskeletal:  Negative for arthralgias.  ?     Right humerus fracture  ?Skin:  Negative for itching and rash.  ?Neurological:  Negative for extremity weakness.  ?Hematological:  Negative for adenopathy.  ?Psychiatric/Behavioral:  Negative for confusion.   ? ?Allergies  ?Allergen Reactions  ? Prednisone Anaphylaxis  ? Other   ? ? ?Patient Active Problem List  ? Diagnosis Date  Noted  ? Closed displaced fracture of surgical neck of humerus, unspecified fracture morphology, initial encounter   ? Acute cystitis without hematuria   ? Abnormal CT of the chest   ? Hypoxia 01/28/2022  ? Lymphedema 07/02/2019  ? S/P right hip fracture 02/05/2019  ? PAD (peripheral artery disease) ([place]) 10/03/2017  ? Essential hypertension 10/03/2017  ? Pain in limb 03/21/2017  ? Swelling of limb 10/18/2016  ? Ulcer of calf ([place]) 10/18/2016  ? Gangrene of foot ([place]) 03/03/2016  ? UPJ (ureteropelvic junction) obstruction   ? Paroxysmal atrial fibrillation ([place]) 12/04/2015  ? Back pain 12/04/2015  ? Thrombocytopenia ([place]) 12/03/2015  ? Leukocytosis 12/03/2015  ? Type 2 diabetes mellitus ([place]) 12/03/2015  ? Respiratory failure ([place])   ? Proteus septicemia ([place])   ? Acute renal failure (ARF) (HCC)   ? Hydronephrosis   ? Endotracheally intubated   ? Respiratory distress   ? Arterial hypotension   ? Sepsis ([place]) 10/20/2015  ? Acute renal failure ([place])   ? Altered mental status   ? NSTEMI (non-ST elevated myocardial infarction) ([place])   ? Sepsis due to urinary tract infection ([place])   ? ? ? ?Past Medical History:  ?Diagnosis Date  ? Acute renal failure (ARF) (HCC)   ? Difficult intubation   ? Hypertension   ? NSTEMI (non-ST elevated myocardial infarction) ([place])   ? Paroxysmal atrial fibrillation (HCC)   ? Renal disorder   ? Sepsis [facility]) January 2017  ? [place]  ? Type 2 diabetes mellitus ([place])   ? ? ? ?Past Surgical History:  ?Procedure Laterality Date  ?  broken hip  2019  ? CYSTOSCOPY WITH STENT PLACEMENT Right 11/03/2015  ? Procedure: CYSTOSCOPY WITH STENT PLACEMENT;  Surgeon: Hollice Espy, MD;  Location: [facility] ORS;  Service: Urology;  Laterality: Right;  ? FRACTURE SURGERY Right   ? Elbow  ? INTRAMEDULLARY (IM) NAIL INTERTROCHANTERIC Right 02/07/2019  ? Procedure: INTRAMEDULLARY (IM) NAIL INTERTROCHANTRIC;  Surgeon: Thornton Park, MD;  Location: [facility] ORS;  Service: Orthopedics;  Laterality: Right;  ? TONSILLECTOMY    ?  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  ? Procedure: TRANSMETATARSAL AMPUTATION;  Surgeon: Algernon Huxley, MD;  Location: [facility] ORS;  Service: Vascular;  Laterality: Bilateral;  ? ? ?Social History  ? ?Socioeconomic History  ? Marital status: Single  ?  Spouse name: Not on file  ? Number of children: Not on file  ? Years of education: Not on file  ? Highest education level: Not on file  ?Occupational History  ? Not on file  ?Tobacco Use  ? Smoking status: Never  ? Smokeless tobacco: Never  ?Substance and Sexual Activity  ? Alcohol use: No  ?  Alcohol/week: 0.0 standard drinks  ? Drug use: No  ? Sexual activity: Not on file  ?Other Topics Concern  ? Not on file  ?Social History Narrative  ? Not on file  ? ?Social Determinants of Health  ? ?Financial Resource Strain: Not on file  ?Food Insecurity: Not on file  ?Transportation Needs: Not on file  ?Physical Activity: Not on file  ?Stress: Not on file  ?Social Connections: Not on file  ?Intimate Partner Violence: Not on file  ? ?  ?Family History  ?Problem Relation Age of Onset  ? Diabetes Father   ? Heart disease Father   ? Stroke Maternal Grandmother   ? ? ? ?Current Facility-Administered Medications:  ?  acetaminophen (TYLENOL) tablet 650 mg, 650 mg, Oral, Q6H PRN **OR** acetaminophen (TYLENOL) suppository 650 mg, 650 mg, Rectal, Q6H PRN, Bonnell Public Tublu, MD ?  cephALEXin (KEFLEX) capsule 500 mg, 500 mg, Oral, TID, Fritzi Mandes, MD, 500 mg at 01/30/22 0836 ?  Chlorhexidine Gluconate Cloth 2 % PADS 6 each, 6 each, Topical, Q0600, Sharion Settler, NP, 6 each at 01/29/22 0516 ?  docusate sodium (COLACE) capsule 100 mg, 100 mg, Oral, BID, Bonnell Public Tublu, MD, 100 mg at 01/29/22 2210 ?  enoxaparin (LOVENOX) injection 50 mg, 0.5 mg/kg, Subcutaneous, Q24H, Bonnell Public Tublu, MD, 50 mg at 01/29/22 2211 ?  insulin aspart (novoLOG) injection 0-15 Units, 0-15 Units, Subcutaneous, TID WC, Vashti Hey, MD, 2 Units at 01/30/22 [id number] ?   metoprolol tartrate (LOPRESSOR) tablet 25 mg, 25 mg, Oral, BID, Bonnell Public Tublu, MD, 25 mg at 01/29/22 2209 ?  mupirocin ointment (BACTROBAN) 2 % 1 application., 1 application., Nasal, BID, Sharion Settler, NP, 1 application. at 01/29/22 2213 ?  ondansetron (ZOFRAN) tablet 4 mg, 4 mg, Oral, Q6H PRN **OR** ondansetron (ZOFRAN) injection 4 mg, 4 mg, Intravenous, Q6H PRN, Jamse Arn, Kyra Searles, MD ?  oxyCODONE-acetaminophen (PERCOCET/ROXICET) 5-325 MG per tablet 1-2 tablet, 1-2 tablet, Oral, Q4H PRN, Fritzi Mandes, MD, 1 tablet at 01/30/22 0840 ?  polyethylene glycol (MIRALAX / GLYCOLAX) packet 17 g, 17 g, Oral, Daily PRN, Vashti Hey, MD, 17 g at 01/28/22 2302 ?  pravastatin (PRAVACHOL) tablet 40 mg, 40 mg, Oral, Daily, Bonnell Public Tublu, MD, 40 mg at 01/29/22 0839 ?  pregabalin (LYRICA) capsule 75 mg, 75 mg, Oral, BID, Bonnell Public Tublu, MD, 75 mg at 01/29/22 6270 ?  trolamine salicylate (ASPERCREME)  10 % cream, , Topical, BID PRN, Sharion Settler, NP, Given at 01/29/22 2211 ? ? ?Physical exam:  ?Vitals:  ? 01/29/22 1536 01/29/22 2018 01/30/22 0517 01/30/22 0745  ?BP: (!) 126/54 (!) 129/54 (!) 128/54 (!) 128/55  ?Pulse: 92 87 78 78  ?Resp: 16 20 16 18   ?Temp: 99.4 ?F (37.4 ?C) 99.7 ?F (37.6 ?C) 98 ?F (36.7 ?C) 98.7 ?F (37.1 ?C)  ?TempSrc: Oral Oral    ?SpO2: 92% 93% 91% 95%  ?Weight:      ?Height:      ? ?Physical Exam ?Eyes:  ?   General: No scleral icterus. ?   Pupils: Pupils are equal, round, and reactive to light.  ?Cardiovascular:  ?   Rate and Rhythm: Normal rate.  ?Pulmonary:  ?   Effort: Pulmonary effort is normal. No respiratory distress.  ?Abdominal:  ?   General: There is no distension.  ?Musculoskeletal:  ?   Cervical back: Normal range of motion and neck supple.  ?   Comments: right upper extremity sling  ?Skin: ?   General: Skin is warm.  ?Neurological:  ?   Mental Status: She is alert and oriented to person, place, and time. Mental status is at baseline.   ?Psychiatric:     ?   Mood and Affect: Mood normal.  ?  ? ? ? ? ? ?  Latest Ref Rng & Units 01/29/2022  ?  5:53 AM  ?CMP  ?Glucose 70 - 99 mg/dL 117    ?BUN 8 - 23 mg/dL 23    ?Creatinine 0.44 - 1.00 mg/dL 0.9

## 2022-01-31 DIAGNOSIS — R9389 Abnormal findings on diagnostic imaging of other specified body structures: Secondary | ICD-10-CM | POA: Diagnosis not present

## 2022-01-31 DIAGNOSIS — R918 Other nonspecific abnormal finding of lung field: Secondary | ICD-10-CM | POA: Diagnosis not present

## 2022-01-31 DIAGNOSIS — R0902 Hypoxemia: Secondary | ICD-10-CM | POA: Diagnosis not present

## 2022-01-31 DIAGNOSIS — S42213A Unspecified displaced fracture of surgical neck of unspecified humerus, initial encounter for closed fracture: Secondary | ICD-10-CM | POA: Diagnosis not present

## 2022-01-31 LAB — GLUCOSE, CAPILLARY
Glucose-Capillary: 122 mg/dL — ABNORMAL HIGH (ref 70–99)
Glucose-Capillary: 139 mg/dL — ABNORMAL HIGH (ref 70–99)
Glucose-Capillary: 123 mg/dL — ABNORMAL HIGH (ref 70–99)

## 2022-01-31 MED ORDER — BISACODYL 10 MG RE SUPP
10.0000 mg | Freq: Once | RECTAL | Status: AC
  Administered 2022-01-31: 10 mg via RECTAL
  Filled 2022-01-31: qty 1

## 2022-01-31 NOTE — TOC Progression Note (Signed)
Transition of Care (TOC) - Progression Note  ? ? ?Patient Details  ?Name: Merikay K Gidley ?MRN: [id number] ?Date of Birth: 05/10/1946 ? ?Transition of Care (TOC) CM/SW Contact  ? J Louvet, RN ?Phone Number: ?01/31/2022, 3:44 PM ? ?Clinical Narrative:    ? ? ?Met with the patient to review bed offers and get bed choice, she stated that she had called her friend but then PT came in so she was not able to review the bed choices with her, she is going to call now and review, she will let me know the bed choice in a little bit ?  ?Barriers to Discharge: Continued Medical Work up ? ?Expected Discharge Plan and Services ?  ?  ?  ?  ?Living arrangements for the past 2 months: Single Family Home ?                ?  ?  ?  ?  ?  ?  ?  ?  ?  ?  ? ? ?Social Determinants of Health (SDOH) Interventions ?  ? ?Readmission Risk Interventions ?   ? View : No data to display.  ?  ?  ?  ? ? ?

## 2022-01-31 NOTE — TOC Progression Note (Signed)
Transition of Care (TOC) - Progression Note  ? ? ?Patient Details  ?Name: April [name] ?MRN: [id number] ?Date of Birth: April 30, 1946 ? ?Transition of Care (TOC) CM/SW Contact  ?Conception Oms, RN ?Phone Number: ?01/31/2022, 12:39 PM ? ?Clinical Narrative:    ? ?Met with the patient to review bed options, After reviewing the bed options, she asked that she have the opportunity to call her friend and discuss before making a choice, She is agreeable for me to come back in a couple hours ? ?  ?Barriers to Discharge: Continued Medical Work up ? ?Expected Discharge Plan and Services ?  ?  ?  ?  ?Living arrangements for the past 2 months: [place] ?                ?  ?  ?  ?  ?  ?  ?  ?  ?  ?  ? ? ?Social Determinants of Health (SDOH) Interventions ?  ? ?Readmission Risk Interventions ?   ? View : No data to display.  ?  ?  ?  ? ? ?

## 2022-01-31 NOTE — Care Management Important Message (Signed)
Important Message ? ?Patient Details  ?Name: April [name] ?MRN: [id number] ?Date of Birth: 1945/11/07 ? ? ?Medicare Important Message Given:  N/A - LOS <3 / Initial given by admissions ? ? ? ? ?[name] ?01/31/2022, 10:30 AM ?

## 2022-01-31 NOTE — Progress Notes (Signed)
Physical Therapy Treatment ?Patient Details ?Name: April [name] ?MRN: [id number] ?DOB: 1946/07/13 ?Today's Date: 01/31/2022 ? ? ?History of Present Illness Pt is a 76 year old female admitted after a fall taking out her trash 01/28/22. Pt with R humerus fracture, UTI. PMH significant for DM 2, HTN, PVD, chronic leukocytosis of unclear etiology. ? ?  ?PT Comments  ? ? Pt received upright in bed agreeable PT services. Intermittent interruptions throughout session occurs from phone calls and RN for giving meds. Pt progressing in mobility, but still relies on bed elevated and modA at torso and to scoot up feet to rest on floor. Pt able to stand with bed elevated with minA to RW and ambulate within room to hallway with ability to steer RW with LUE only maintaining NWB precautions on RLE in sling with supervision. Pt seated in recliner with good safety awareness while PT assists in donning clean chuck pad with minA from recliner and transfers to EOB with maxA at LE's to return to supine and maxA to scoot up in bed. Pt's sling adjusted to pt comfort with education on proper donning with pillows propped under RUE for pt comfort in scapular plane. All needs in reach. D/c recs remain appropriate for STR as pt is requiring increased physical assist for transfers due to RUE NWB precautions placing pt at risk for falls and injury at home environment.  ?   ?Recommendations for follow up therapy are one component of a multi-disciplinary discharge planning process, led by the attending physician.  Recommendations may be updated based on patient status, additional functional criteria and insurance authorization. ? ?Follow Up Recommendations ? Skilled nursing-short term rehab (<3 hours/day) ?  ?  ?Assistance Recommended at Discharge Frequent or constant Supervision/Assistance  ?Patient can return home with the following A lot of help with bathing/dressing/bathroom;A lot of help with walking and/or transfers;Help with stairs or ramp for  entrance;Assistance with cooking/housework ?  ?Equipment Recommendations ?  (tbd by next venue of care)  ?  ?Recommendations for Other Services   ? ? ?  ?Precautions / Restrictions Precautions ?Precautions: Fall ?Required Braces or Orthoses: Sling ?Restrictions ?Weight Bearing Restrictions: Yes ?RUE Weight Bearing: Non weight bearing ?Other Position/Activity Restrictions: per ortho note: NWB RUE in sling, okay for pendulum exercises, hand wrist and elbow motion as tolerated  ?  ? ?Mobility ? Bed Mobility ?Overal bed mobility: Needs Assistance ?Bed Mobility: Supine to Sit, Sit to Supine ?  ?  ?Supine to sit: Mod assist, HOB elevated ?Sit to supine: Max assist ?  ?General bed mobility comments: maxA at LE's ?Patient Response: Cooperative ? ?Transfers ?Overall transfer level: Needs assistance ?Equipment used: Rolling walker (2 wheels) ?Transfers: Sit to/from Stand ?Sit to Stand: Min assist, From elevated surface ?  ?  ?  ?  ?  ?General transfer comment: cueing for L hand placement ?  ? ?Ambulation/Gait ?Ambulation/Gait assistance: Min guard ?Gait Distance (Feet): 25 Feet ?Assistive device: Rolling walker (2 wheels) ?Gait Pattern/deviations: Step-through pattern, Narrow base of support, Decreased step length - right, Decreased step length - left ?  ?  ?  ?General Gait Details: Pt able to succesfully ambulate in room to hallway with slow, step through gait with LUE on RW for support and safely maniupulate RW with LUE only while RUE is in sling without LOB and maintaining RUE precautions. ? ? ?Stairs ?  ?  ?  ?  ?  ? ? ?Wheelchair Mobility ?  ? ?Modified Rankin (Stroke Patients Only) ?  ? ? ?  ?  Balance Overall balance assessment: Needs assistance ?Sitting-balance support: Single extremity supported, Feet supported ?Sitting balance-Leahy Scale: Good ?  ?  ?Standing balance support: Single extremity supported, During functional activity ?Standing balance-Leahy Scale: Fair ?  ?  ?  ?  ?  ?  ?  ?  ?  ?  ?  ?  ?  ? ?   ?Cognition Arousal/Alertness: Awake/alert ?Behavior During Therapy: [facility] for tasks assessed/performed ?Overall Cognitive Status: Within Functional Limits for tasks assessed ?  ?  ?  ?  ?  ?  ?  ?  ?  ?  ?  ?  ?  ?  ?  ?  ?  ?  ?  ? ?  ?Exercises Other Exercises ?Other Exercises: ADjustment of sling for improved comfort of R shoulder. Use of pillows to prop RUE in comfortable resting position of R shoulder ? ?  ?General Comments   ?  ?  ? ?Pertinent Vitals/Pain Pain Assessment ?Pain Assessment: Faces ?Faces Pain Scale: Hurts little more ?Pain Location: RUE ?Pain Descriptors / Indicators: Aching, Grimacing, Guarding ?Pain Intervention(s): Limited activity within patient's tolerance, Premedicated before session, Repositioned  ? ? ?Home Living   ?  ?  ?  ?  ?  ?  ?  ?  ?  ?   ?  ?Prior Function    ?  ?  ?   ? ?PT Goals (current goals can now be found in the care plan section) Acute Rehab PT Goals ?Patient Stated Goal: to stop being in pain ?PT Goal Formulation: With patient ?Time For Goal Achievement: 02/12/22 ?Potential to Achieve Goals: Fair ?Progress towards PT goals: Progressing toward goals ? ?  ?Frequency ? ? ? Min 2X/week ? ? ? ?  ?PT Plan Current plan remains appropriate  ? ? ?Co-evaluation   ?  ?  ?  ?  ? ?  ?AM-PAC PT "6 Clicks" Mobility   ?Outcome Measure ? Help needed turning from your back to your side while in a flat bed without using bedrails?: A Lot ?Help needed moving from lying on your back to sitting on the side of a flat bed without using bedrails?: A Lot ?Help needed moving to and from a bed to a chair (including a wheelchair)?: A Little ?Help needed standing up from a chair using your arms (e.g., wheelchair or bedside chair)?: A Lot ?Help needed to walk in hospital room?: A Little ?Help needed climbing 3-5 steps with a railing? : A Lot ?6 Click Score: 14 ? ?  ?End of Session Equipment Utilized During Treatment: Gait belt ?Activity Tolerance: Patient tolerated treatment well;Patient limited by  pain ?Patient left: in bed;with call bell/phone within reach;with bed alarm set ?Nurse Communication: Mobility status ?PT Visit Diagnosis: Unsteadiness on feet (R26.81);Muscle weakness (generalized) (M62.81);Difficulty in walking, not elsewhere classified (R26.2);Pain;History of falling (Z91.81) ?Pain - Right/Left: Right ?Pain - part of body: Shoulder ?  ? ? ?Time: 8413-2440 ?PT Time Calculation (min) (ACUTE ONLY): 37 min ? ?Charges:  $Gait Training: 23-37 mins          ?          ? ?Salem Caster. Fairly IV, PT, DPT ?Physical Therapist- [place]  ?[facility]  ?01/31/2022, 3:58 PM ? ?

## 2022-01-31 NOTE — Progress Notes (Addendum)
OT Cancellation Note ? ?Patient Details ?Name: April [name] ?MRN: [id number] ?DOB: [birth year] ? ? ?Cancelled Treatment:    Reason Eval/Treat Not Completed: Patient declined, no reason specified. Upon attempt this morning, pt declined citing need for sleep. Agreeable to re-attempt at later time. ? ?Ardeth Perfect., MPH, MS, OTR/L ?ascom [phone] ?01/31/22, 4:21 PM ? ?

## 2022-01-31 NOTE — Progress Notes (Addendum)
[place] [facility] ? ? ?PATIENT NAME: April [name]   ? ?MR#:  [id number] ? ?DATE OF BIRTH:  1946-08-26 ? ?SUBJECTIVE:  ? ?patient came in after mechanical fall at home while taking her recycle bin to the curb side. She fell on sustained right humerus fracture.  ?Remains constipated ? ?VITALS:  ?Blood pressure 129/64, pulse 79, temperature 98 ?F (36.7 ?C), resp. rate 16, height 5\' 6"  (1.676 m), weight 99.8 kg, SpO2 92 %. ? ?PHYSICAL EXAMINATION:  ? ?GENERAL:  76 y.o.-year-old patient lying in the bed with no acute distress.  ?LUNGS: Normal breath sounds bilaterally, no wheezing, rales, rhonchi.  ?CARDIOVASCULAR: S1, S2 normal. No murmurs, rubs, or gallops.  ?ABDOMEN: Soft, nontender, nondistended. Bowel sounds present.  ?EXTREMITIES: No  edema b/l.   Right shoulder sling ?NEUROLOGIC: nonfocal  patient is alert and awake ?SKIN: No obvious rash, lesion, or ulcer.  ? ?LABORATORY PANEL:  ?CBC ?Recent Labs  ?Lab 01/29/22 ?7893  ?WBC 14.8*  ?HGB 14.6  ?HCT 46.8*  ?PLT 292  ? ? ? ?Chemistries  ?Recent Labs  ?Lab 01/29/22 ?8101  ?NA 137  ?K 4.4  ?CL 101  ?CO2 30  ?GLUCOSE 117*  ?BUN 23  ?CREATININE 0.92  ?CALCIUM 9.0  ? ? ?Cardiac Enzymes ?No results for input(s): TROPONINI in the last 168 hours. ?RADIOLOGY:  ?No results found. ? ?Assessment and Plan ?  ?ELVETA RAPE is an 76 y.o. female DM 2, HTN, PVD, chronic leukocytosis of unclear etiology was in her USO H until earlier today when she had a mechanical fall as she was trying to take the trash out.  Patient had pain in her right arm and presented to the ED for evaluation.  She was noted to have a right humeral fracture which was splinted.  As patient was getting ready to go home, patient was noted to have significant hypoxia with ambulation.  She had no hypoxia at rest. ? ?Hypoxia transient ?abnormal chest x-ray left lung mass ?-- clinically no evidence of pneumonia. Discontinue IV antibiotic ?-- CT chest no PE. Showed multiple solitary  nodules and left lower lobe mass ?-- oncology consultation with Dr. Tasia Catchings appreciated. Pt does not want any further evaluation ?-- results reviewed with patient ? ?right humerus fracture status post mechanical fall ?-- patient will follow-up with Dr. Sharlet Salina as outpatient. ?-- Continue right upper extremity sling ?-- worked with physical therapy recommends rehab ? ?UTI ?-- continue Keflex 500 TID ?-- complains of dysuria/burning ? ?DM II without complication ?-- continue sliding scale insulin ?-- at home takes metformin and jardiance ? ?hypertension continue metoprolol ? ? ?patient medically stable for discharge to rehab. Per TOC rehab bed offers given to patient awaiting to have her make choice ? ?Family communication : none ?Consults : oncology, orthopedic ?CODE STATUS: full ?DVT Prophylaxis : enoxaparin ?Level of care: Med-Surg ?Status is: Inpatient ?Remains inpatient appropriate because: right shoulder fracture. TOC for discharge planning to Rehab ?Medically stable ?  ? ?TOTAL TIME TAKING CARE OF THIS PATIENT: 25 minutes.  ?>50% time spent on counselling and coordination of care ? ?Note: This dictation was prepared with Dragon dictation along with smaller phrase technology. Any transcriptional errors that result from this process are unintentional. ? ?Fritzi Mandes M.D  ? ? ?[facility]  ? ?CC: ?Primary care physician; Albina Billet, MD  ?

## 2022-01-31 NOTE — Progress Notes (Signed)
PT Cancellation Note ? ?Patient Details ?Name: KENSLI BOWLEY ?MRN: [id number] ?DOB: June 29, 1946 ? ? ?Cancelled Treatment:    Reason Eval/Treat Not Completed: Other (comment). Upon entry to room pt in care of hospital personnel in room. PT to re-attempt later time/date.  ? ? ?Salem Caster. Fairly IV, PT, DPT ?Physical Therapist- [place]  ?[facility]  ?01/31/2022, 2:16 PM ?

## 2022-02-01 DIAGNOSIS — R918 Other nonspecific abnormal finding of lung field: Secondary | ICD-10-CM | POA: Diagnosis not present

## 2022-02-01 DIAGNOSIS — R9389 Abnormal findings on diagnostic imaging of other specified body structures: Secondary | ICD-10-CM | POA: Diagnosis not present

## 2022-02-01 DIAGNOSIS — S42213A Unspecified displaced fracture of surgical neck of unspecified humerus, initial encounter for closed fracture: Secondary | ICD-10-CM | POA: Diagnosis not present

## 2022-02-01 DIAGNOSIS — R0902 Hypoxemia: Secondary | ICD-10-CM | POA: Diagnosis not present

## 2022-02-01 LAB — CBC
HCT: 47.9 % — ABNORMAL HIGH (ref 36.0–46.0)
Hemoglobin: 15.3 g/dL — ABNORMAL HIGH (ref 12.0–15.0)
MCH: 27.9 pg (ref 26.0–34.0)
MCHC: 31.9 g/dL (ref 30.0–36.0)
MCV: 87.2 fL (ref 80.0–100.0)
Platelets: 334 10*3/uL (ref 150–400)
RBC: 5.49 MIL/uL — ABNORMAL HIGH (ref 3.87–5.11)
RDW: 14.8 % (ref 11.5–15.5)
WBC: 19.3 10*3/uL — ABNORMAL HIGH (ref 4.0–10.5)
nRBC: 0 % (ref 0.0–0.2)

## 2022-02-01 LAB — GLUCOSE, CAPILLARY
Glucose-Capillary: 133 mg/dL — ABNORMAL HIGH (ref 70–99)
Glucose-Capillary: 138 mg/dL — ABNORMAL HIGH (ref 70–99)
Glucose-Capillary: 148 mg/dL — ABNORMAL HIGH (ref 70–99)
Glucose-Capillary: 135 mg/dL — ABNORMAL HIGH (ref 70–99)
Glucose-Capillary: 181 mg/dL — ABNORMAL HIGH (ref 70–99)

## 2022-02-01 LAB — URINE CULTURE: Culture: >=100000 — AB

## 2022-02-01 MED ORDER — CEPHALEXIN 500 MG PO CAPS
500.0000 mg | ORAL_CAPSULE | Freq: Two times a day (BID) | ORAL | Status: DC
  Administered 2022-02-01 – 2022-02-02 (×3): 500 mg via ORAL
  Filled 2022-02-01 (×3): qty 1

## 2022-02-01 NOTE — Progress Notes (Signed)
[place] at [facility] ? ? ?PATIENT NAME: April [name]   ? ?MR#:  [id number] ? ?DATE OF BIRTH:  09-Dec-1945 ? ?SUBJECTIVE:  ? ?patient came in after mechanical fall at home while taking her recycle bin to the curb side. She fell on sustained right humerus fracture.  ?Had BM yday ? ?VITALS:  ?Blood pressure 126/69, pulse 74, temperature 98.3 ?F (36.8 ?C), resp. rate 18, height 5\' 6"  (1.676 m), weight 99.8 kg, SpO2 92 %. ? ?PHYSICAL EXAMINATION:  ? ?GENERAL:  76 y.o.-year-old patient lying in the bed with no acute distress.  ?LUNGS: Normal breath sounds bilaterally, no wheezing, rales, rhonchi.  ?CARDIOVASCULAR: S1, S2 normal. No murmurs, rubs, or gallops.  ?ABDOMEN: Soft, nontender, nondistended. Bowel sounds present.  ?EXTREMITIES: No  edema b/l.   Right shoulder sling ?NEUROLOGIC: nonfocal  patient is alert and awake ?SKIN: No obvious rash, lesion, or ulcer.  ? ?LABORATORY PANEL:  ?CBC ?Recent Labs  ?Lab 02/01/22 ?0328  ?WBC 19.3*  ?HGB 15.3*  ?HCT 47.9*  ?PLT 334  ? ? ? ?Chemistries  ?Recent Labs  ?Lab 01/29/22 ?5643  ?NA 137  ?K 4.4  ?CL 101  ?CO2 30  ?GLUCOSE 117*  ?BUN 23  ?CREATININE 0.92  ?CALCIUM 9.0  ? ? ?Cardiac Enzymes ?No results for input(s): TROPONINI in the last 168 hours. ?RADIOLOGY:  ?No results found. ? ?Assessment and Plan ?  ?April [name] is an 76 y.o. female DM 2, HTN, PVD, chronic leukocytosis of unclear etiology was in her USO H until earlier today when she had a mechanical fall as she was trying to take the trash out.  Patient had pain in her right arm and presented to the ED for evaluation.  She was noted to have a right humeral fracture which was splinted.  As patient was getting ready to go home, patient was noted to have significant hypoxia with ambulation.  She had no hypoxia at rest. ? ?Hypoxia transient ?abnormal chest x-ray left lung mass ?-- clinically no evidence of pneumonia. Discontinue IV antibiotic ?-- CT chest no PE. Showed multiple solitary nodules  and left lower lobe mass ?-- oncology consultation with Dr. Tasia Catchings appreciated. Pt does not want any further evaluation ?-- results reviewed with patient ? ?right humerus fracture status post mechanical fall ?-- patient will follow-up with Dr. Sharlet Salina as outpatient. ?-- Continue right upper extremity sling ?-- worked with physical therapy recommends rehab ? ?UTI ?-- continue Keflex 500 TID ?-- complains of dysuria/burning ? ?DM II without complication ?-- continue sliding scale insulin ?-- at home takes metformin and jardiance ? ?hypertension continue metoprolol ? ? ?patient medically stable for discharge to rehab.  ?Family communication : none ?Consults : oncology, orthopedic ?CODE STATUS: full ?DVT Prophylaxis : enoxaparin ?Level of care: Med-Surg ?Status is: Inpatient ?Remains inpatient appropriate because: right shoulder fracture. TOC for discharge planning to Rehab ?Medically stable. Per TOC patient will be [facility] tomorrow ?  ? ?TOTAL TIME TAKING CARE OF THIS PATIENT: 25 minutes.  ?>50% time spent on counselling and coordination of care ? ?Note: This dictation was prepared with Dragon dictation along with smaller phrase technology. Any transcriptional errors that result from this process are unintentional. ? ?Fritzi Mandes M.D  ? ? ?[facility]  ? ?CC: ?Primary care physician; Albina Billet, MD  ?

## 2022-02-01 NOTE — Care Management Important Message (Signed)
Important Message ? ?Patient Details  ?Name: April [name] ?MRN: [id number] ?Date of Birth: Sep 12, 1946 ? ? ?Medicare Important Message Given:  Yes ? ? ? ? ?[name] ?02/01/2022, 11:35 AM ?

## 2022-02-01 NOTE — TOC Progression Note (Signed)
Transition of Care (TOC) - Progression Note  ? ? ?Patient Details  ?Name: April [name] ?MRN: [id number] ?Date of Birth: 1946-02-21 ? ?Transition of Care (TOC) CM/SW Contact  ?Conception Oms, RN ?Phone Number: ?02/01/2022, 9:46 AM ? ?Clinical Narrative:    ? ?Ins is approved to go to [facility], 5/10-5/12, Ref number [id number] ? ?  ?Barriers to Discharge: Continued Medical Work up ? ?Expected Discharge Plan and Services ?  ?  ?  ?  ?Living arrangements for the past 2 months: [place] ?                ?  ?  ?  ?  ?  ?  ?  ?  ?  ?  ? ? ?Social Determinants of Health (SDOH) Interventions ?  ? ?Readmission Risk Interventions ?   ? View : No data to display.  ?  ?  ?  ? ? ?

## 2022-02-01 NOTE — Plan of Care (Signed)

## 2022-02-01 NOTE — Progress Notes (Signed)
Occupational Therapy Treatment ?Patient Details ?Name: April [name] ?MRN: [id number] ?DOB: 10/27/1945 ?Today's Date: 02/01/2022 ? ? ?History of present illness Pt is a 76 year old female admitted after a fall taking out her trash 01/28/22. Pt with R humerus fracture, UTI. PMH significant for DM 2, HTN, PVD, chronic leukocytosis of unclear etiology. ?  ?OT comments ? Pt seen for OT tx. Pt with nurse tech preparing for bathing and bed linens change. Pt endorsing significant fatigue and RUE pain, needing encouragement to participate. Pt required MOD A for rolling and maintaining side lying while nurse tech provided MAX A for UB/LB bathing tasks. VC for RUE positioning and bed rail use. Once completed, pt completed grooming tasks from seated position with set up. OT readjusted sling for improved fit and comfort. Pt continues to benefit from skilled OT services. Pt limited by fatigue and RUE pain this date.   ? ?Recommendations for follow up therapy are one component of a multi-disciplinary discharge planning process, led by the attending physician.  Recommendations may be updated based on patient status, additional functional criteria and insurance authorization. ?   ?Follow Up Recommendations ? Skilled nursing-short term rehab (<3 hours/day)  ?  ?Assistance Recommended at Discharge Intermittent Supervision/Assistance  ?Patient can return home with the following ? A lot of help with bathing/dressing/bathroom;A lot of help with walking and/or transfers;Assistance with cooking/housework;Assist for transportation;Help with stairs or ramp for entrance;Direct supervision/assist for medications management ?  ?Equipment Recommendations ? None recommended by OT (pt has bsc and shower chair)  ?  ?Recommendations for Other Services   ? ?  ?Precautions / Restrictions Precautions ?Precautions: Fall ?Precaution Comments: watch spo2 ?Required Braces or Orthoses: Sling ?Restrictions ?Weight Bearing Restrictions: Yes ?RUE Weight Bearing:  Non weight bearing ?Other Position/Activity Restrictions: per ortho note: NWB RUE in sling, okay for pendulum exercises, hand wrist and elbow motion as tolerated  ? ? ?  ? ?Mobility Bed Mobility ?Overal bed mobility: Needs Assistance ?Bed Mobility: Rolling ?Rolling: Mod assist ?  ?  ?  ?  ?General bed mobility comments: MOD A for rolling and maintaining sidelying while nurse tech performed bed bath and linens change ?  ? ?Transfers ?  ?  ?  ?  ?  ?  ?  ?  ?  ?General transfer comment: pt declined 2/2 pain and fatigue, requesting a nap ?  ?  ?Balance   ?  ?  ?  ?  ?  ?  ?  ?  ?  ?  ?  ?  ?  ?  ?  ?  ?  ?  ?   ? ?ADL either performed or assessed with clinical judgement  ? ?ADL Overall ADL's : Needs assistance/impaired ?  ?  ?Grooming: Sitting;Set up;Oral care ?  ?Upper Body Bathing: Bed level;Maximal assistance ?  ?Lower Body Bathing: Bed level;Maximal assistance ?Lower Body Bathing Details (indicate cue type and reason): Pt required MAX A from bed level with additional assist for maintaining sidelying ?  ?  ?  ?  ?  ?  ?  ?  ?  ?  ?  ?  ?  ? ?Extremity/Trunk Assessment   ?  ?  ?  ?  ?  ? ?Vision   ?  ?  ?Perception   ?  ?Praxis   ?  ? ?Cognition Arousal/Alertness: Awake/alert ?Behavior During Therapy: [facility] for tasks assessed/performed ?Overall Cognitive Status: Within Functional Limits for tasks assessed ?  ?  ?  ?  ?  ?  ?  ?  ?  ?  ?  ?  ?  ?  ?  ?  ?  ?  ?  ?   ?  Exercises Other Exercises ?Other Exercises: Pt educated in RUE positioning and sling mgt requiring OT to adjust for improved fit and comfort as well as pillow placement to support comfort. ? ?  ?Shoulder Instructions   ? ? ?  ?General Comments    ? ? ?Pertinent Vitals/ Pain       Pain Assessment ?Pain Assessment: 0-10 ?Pain Score: 6  ?Pain Location: RUE ?Pain Descriptors / Indicators: Aching, Grimacing, Guarding ?Pain Intervention(s): Limited activity within patient's tolerance, Monitored during session, Repositioned, Other (comment) (sling repositioned for  improved fit and comfort) ? ?Home Living   ?  ?  ?  ?  ?  ?  ?  ?  ?  ?  ?  ?  ?  ?  ?  ?  ?  ?  ? ?  ?Prior Functioning/Environment    ?  ?  ?  ?   ? ?Frequency ? Min 2X/week  ? ? ? ? ?  ?Progress Toward Goals ? ?OT Goals(current goals can now be found in the care plan section) ? Progress towards OT goals: Progressing toward goals ? ?Acute Rehab OT Goals ?Patient Stated Goal: get stronger ?OT Goal Formulation: With patient ?Time For Goal Achievement: 02/12/22 ?Potential to Achieve Goals: Good  ?Plan Discharge plan remains appropriate;Frequency remains appropriate   ? ?Co-evaluation ? ? ?   ?  ?  ?  ?  ? ?  ?AM-PAC OT "6 Clicks" Daily Activity     ?Outcome Measure ? ? Help from another person eating meals?: A Little ?Help from another person taking care of personal grooming?: A Little ?Help from another person toileting, which includes using toliet, bedpan, or urinal?: A Lot ?Help from another person bathing (including washing, rinsing, drying)?: A Lot ?Help from another person to put on and taking off regular upper body clothing?: A Lot ?Help from another person to put on and taking off regular lower body clothing?: A Lot ?6 Click Score: 14 ? ?  ?End of Session   ? ?OT Visit Diagnosis: Unsteadiness on feet (R26.81);History of falling (Z91.81);Muscle weakness (generalized) (M62.81) ?  ?Activity Tolerance Patient limited by fatigue;Patient limited by pain ?  ?Patient Left in bed;with call bell/phone within reach;with bed alarm set;with nursing/sitter in room ?  ?Nurse Communication   ?  ? ?   ? ?Time: 4142-3953 ?OT Time Calculation (min): 13 min ? ?Charges: OT General Charges ?$OT Visit: 1 Visit ?OT Treatments ?$Self Care/Home Management : 8-22 mins ? ?Ardeth Perfect., MPH, MS, OTR/L ?ascom [phone] ?02/01/22, 12:34 PM ? ?

## 2022-02-01 NOTE — TOC Transition Note (Signed)
Transition of Care (TOC) - CM/SW Discharge Note ? ? ?Patient Details  ?Name: April [name] ?MRN: [id number] ?Date of Birth: 1946/08/24 ? ?Transition of Care (TOC) CM/SW Contact:  ?Conception Oms, RN ?Phone Number: ?02/01/2022, 9:36 AM ? ? ?Clinical Narrative:    ? ?Reviewed the bed offers again with the patient along with the Star rating, She chose [facility], I notified [place] with [facility] ?Ins pending ?  ?Barriers to Discharge: Continued Medical Work up ? ? ?Patient Goals and CMS Choice ?Patient states their goals for this hospitalization and ongoing recovery are:: Participate in rehab and return home ?  ?  ? ?Discharge Placement ?  ?           ?  ?  ?  ?  ? ?Discharge Plan and Services ?  ?  ?           ?  ?  ?  ?  ?  ?  ?  ?  ?  ?  ? ?Social Determinants of Health (SDOH) Interventions ?  ? ? ?Readmission Risk Interventions ?   ? View : No data to display.  ?  ?  ?  ? ? ? ? ? ?

## 2022-02-01 NOTE — Progress Notes (Signed)
PT Cancellation Note ? ?Patient Details ?Name: April [name] ?MRN: [id number] ?DOB: 04-19-1946 ? ? ?Cancelled Treatment:    Reason Eval/Treat Not Completed: Fatigue/lethargy limiting ability to participate. Pt received sleeping in bed. Requesting to re-attempt in p.m. so pt can attempt to sleep. Will re-attempt at later time/date as able. ? ? ?Salem Caster. Fairly IV, PT, DPT ?Physical Therapist- [place]  ?[facility]  ?02/01/2022, 11:17 AM ?

## 2022-02-02 DIAGNOSIS — R0902 Hypoxemia: Secondary | ICD-10-CM | POA: Diagnosis not present

## 2022-02-02 LAB — GLUCOSE, CAPILLARY: Glucose-Capillary: 108 mg/dL — ABNORMAL HIGH (ref 70–99)

## 2022-02-02 MED ORDER — OXYCODONE HCL 7.5 MG PO TABS: 7.5000 mg | ORAL_TABLET | ORAL | 0 refills | Status: DC | PRN

## 2022-02-02 MED ORDER — CEPHALEXIN 500 MG PO CAPS
500.0000 mg | ORAL_CAPSULE | Freq: Two times a day (BID) | ORAL | 0 refills | Status: AC
Start: 2022-02-02 — End: 2022-02-04

## 2022-02-02 NOTE — TOC Progression Note (Signed)
Transition of Care (TOC) - Progression Note  ? ? ?Patient Details  ?Name: April [name] ?MRN: [id number] ?Date of Birth: 1946/06/29 ? ?Transition of Care (TOC) CM/SW Contact  ?Conception Oms, RN ?Phone Number: ?02/02/2022, 10:45 AM ? ?Clinical Narrative:    ? ?Patient going to [employer] [address] she will alert family ?EMS called she is on the transport list to be picked up ? ? ?  ?Barriers to Discharge: Continued Medical Work up ? ?Expected Discharge Plan and Services ?  ?  ?  ?  ?Living arrangements for the past 2 months: [place] ?Expected Discharge Date: 02/02/22               ?  ?  ?  ?  ?  ?  ?  ?  ?  ?  ? ? ?Social Determinants of Health (SDOH) Interventions ?  ? ?Readmission Risk Interventions ?   ? View : No data to display.  ?  ?  ?  ? ? ?

## 2022-02-02 NOTE — TOC Progression Note (Signed)
Transition of Care (TOC) - Progression Note  ? ? ?Patient Details  ?Name: April [name] ?MRN: [id number] ?Date of Birth: [birth year] ? ?Transition of Care (TOC) CM/SW Contact  ?Conception Oms, RN ?Phone Number: ?02/02/2022, 9:09 AM ? ?Clinical Narrative:    ? ?Obtained PASSR number [id number] ? ?  ?Barriers to Discharge: Continued Medical Work up ? ?Expected Discharge Plan and Services ?  ?  ?  ?  ?Living arrangements for the past 2 months: [place] ?Expected Discharge Date: 02/02/22               ?  ?  ?  ?  ?  ?  ?  ?  ?  ?  ? ? ?Social Determinants of Health (SDOH) Interventions ?  ? ?Readmission Risk Interventions ?   ? View : No data to display.  ?  ?  ?  ? ? ?

## 2022-02-02 NOTE — Discharge Summary (Signed)
?Physician Discharge Summary ?  ?Patient: April [name] MRN: [id number] DOB: 03-29-1946  ?Admit date:     01/28/2022  ?Discharge date: 02/02/22  ?Discharge Physician: Fritzi Mandes  ? ?PCP: Albina Billet, MD  ? ?Recommendations at discharge:  ? ?follow-up Dr. Sharlet Salina orthopedic in two weeks ?follow-up PCP in 1 to 2 weeks ? ?Discharge Diagnoses: ?right humerus fracture post fall-- conservative management ? ?Hospital Course: ?EVAROSE ALTLAND is an 76 y.o. female DM 2, HTN, PVD, chronic leukocytosis of unclear etiology was in her USO H until earlier today when she had a mechanical fall as she was trying to take the trash out.  Patient had pain in her right arm and presented to the ED for evaluation.  She was noted to have a right humeral fracture which was splinted.  As patient was getting ready to go home, patient was noted to have significant hypoxia with ambulation.  She had no hypoxia at rest. ?  ?Hypoxia transient ?abnormal chest x-ray left lung mass ?-- clinically no evidence of pneumonia. Discontinue IV antibiotic ?-- CT chest no PE. Showed multiple solitary nodules and left lower lobe mass ?-- oncology consultation with Dr. Tasia Catchings appreciated. Pt does not want any further evaluation ?-- results reviewed with patient ?  ?right humerus fracture status post mechanical fall ?-- patient will follow-up with Dr. Sharlet Salina as outpatient in 2 weeks ?-- Continue right upper extremity sling ?-- worked with physical therapy recommends rehab ?  ?UTI ?-- continue Keflex 500 BID ?-- complains of dysuria/burning ?  ?DM II without complication ?-- continue sliding scale insulin ?-- at home takes metformin and jardiance ?  ?Hypertension ?-- continue metoprolol ? ?Chronic leucocytosis ?--pt afebrile ?--on abxs for UTI ?  ?  ?patient medically stable for discharge to rehab.  ?Family communication : none ?Consults : oncology, orthopedic ?CODE STATUS: full ?DVT Prophylaxis : enoxaparin ?Level of care: Med-Surg ? ?  ? ?Disposition:  Rehabilitation facility ?Diet recommendation:  ?Discharge Diet Orders (From admission, onward)  ? ?  Start     Ordered  ? 02/02/22 0000  Diet Carb Modified       ? 02/02/22 6160  ? ?  ?  ? ?  ? ?Cardiac and Carb modified diet ?DISCHARGE MEDICATION: ?Allergies as of 02/02/2022   ? ?   Reactions  ? Prednisone Anaphylaxis  ? Other   ? ?  ? ?  ?Medication List  ?  ? ?STOP taking these medications   ? ?aspirin 81 MG EC tablet ?  ?enoxaparin 40 MG/0.4ML injection ?Commonly known as: LOVENOX ?  ?insulin glargine 100 UNIT/ML injection ?Commonly known as: LANTUS ?  ?meloxicam 15 MG tablet ?Commonly known as: MOBIC ?  ?tamsulosin 0.4 MG Caps capsule ?Commonly known as: FLOMAX ?  ?traMADol 50 MG tablet ?Commonly known as: ULTRAM ?  ?traZODone 50 MG tablet ?Commonly known as: DESYREL ?  ? ?  ? ?TAKE these medications   ? ?acetaminophen 500 MG tablet ?Commonly known as: TYLENOL ?Take 1,000 mg by mouth every 8 (eight) hours as needed for moderate pain. Reported on 12/16/2015 ?  ?cephALEXin 500 MG capsule ?Commonly known as: KEFLEX ?Take 1 capsule (500 mg total) by mouth every 12 (twelve) hours for 2 days. ?  ?diclofenac sodium 1 % Gel ?Commonly known as: VOLTAREN ?Apply 4 g topically 4 (four) times daily. ?  ?docusate sodium 100 MG capsule ?Commonly known as: COLACE ?Take 1 capsule (100 mg total) by mouth 2 (two) times daily. ?  ?empagliflozin  10 MG Tabs tablet ?Commonly known as: JARDIANCE ?Take by mouth daily. ?  ?metFORMIN 500 MG tablet ?Commonly known as: GLUCOPHAGE ?Take 500 mg by mouth 2 (two) times daily. ?  ?metoprolol tartrate 25 MG tablet ?Commonly known as: LOPRESSOR ?Take 1 tablet (25 mg total) by mouth 2 (two) times daily. ?  ?oxyCODONE HCl 7.5 MG Tabs ?Take 7.5 mg by mouth every 4 (four) hours as needed for severe pain or moderate pain. ?What changed:  ?medication strength ?how much to take ?when to take this ?  ?polyethylene glycol 17 g packet ?Commonly known as: MIRALAX / GLYCOLAX ?Take 17 g by mouth daily. ?What  changed:  ?when to take this ?reasons to take this ?  ?pravastatin 40 MG tablet ?Commonly known as: PRAVACHOL ?Take 40 mg by mouth daily. ?What changed: Another medication with the same name was removed. Continue taking this medication, and follow the directions you see here. ?  ?pregabalin 75 MG capsule ?Commonly known as: LYRICA ?Take 1 capsule (75 mg total) by mouth 2 (two) times daily. ?  ? ?  ? ? Contact information for follow-up providers   ? ? Albina Billet, MD. Schedule an appointment as soon as possible for a visit in 1 week(s).   ?Specialty: Internal Medicine ?Why: Hospital follow-up ?Contact information: ?[address]   ?[place] [address] ?[phone] ? ? ?  ?  ? ? Renee Harder, MD Follow up in 2 week(s).   ?Specialty: Orthopedic Surgery ?Why: right humerus fracture ?Contact information: ?[address] ?[place] [address] ?[phone] ? ? ?  ?  ? ?  ?  ? ? Contact information for after-discharge care   ? ? Destination   ? ? [name] .   ?Service: Skilled Nursing ?Contact information: ?[address] ?[place] ?[phone] ? ?  ?  ? ?  ?  ? ?  ?  ? ?  ? ?Discharge Exam: ?Filed Weights  ? 01/28/22 1059  ?Weight: 99.8 kg  ? ? ? ?Condition at discharge: fair ? ?The results of significant diagnostics from this hospitalization (including imaging, microbiology, ancillary and laboratory) are listed below for reference.  ? ?Imaging Studies: ?DG Chest 1 View ? ?Result Date: 01/28/2022 ?CLINICAL DATA:  Hypoxia EXAM: CHEST  1 VIEW COMPARISON:  Chest x-ray dated Feb 09, 2019 FINDINGS: Cardiac and mediastinal contours within normal limits. Indeterminate nodular opacity of the right mid lung. Bibasilar opacities, likely due to atelectasis. No focal consolidation. Old left-sided rib fractures. IMPRESSION: 1. No focal consolidation. 2. Indeterminate nodular opacity of the right  mid lung. Recommend CT for further evaluation. Electronically Signed   By: Yetta Glassman M.D.   On: 01/28/2022 18:02  ? ?DG Shoulder Right ? ?Result Date: 01/28/2022 ?CLINICAL DATA:  Right shoulder pain after fall. EXAM: RIGHT SHOULDER - 2+ VIEW COMPARISON:  None Available. FINDINGS: Moderately displaced proximal right humeral neck fracture is noted. Visualized ribs are unremarkable. IMPRESSION: Moderately displaced proximal right humeral neck fracture. Electronically Signed   By: Marijo Conception M.D.   On: 01/28/2022 12:14  ? ?CT Head Wo Contrast ? ?Result Date: 01/28/2022 ?CLINICAL DATA:  Fall with head and neck trauma.  Shoulder pain. EXAM: CT HEAD WITHOUT CONTRAST CT CERVICAL SPINE WITHOUT CONTRAST TECHNIQUE: Multidetector CT imaging of the head and cervical spine was performed following the standard protocol without intravenous contrast. Multiplanar CT image reconstructions of the cervical  spine were also generated. RADIATION DOSE REDUCTION: This exam was performed according to the departmental dose-optimization program which includes automated exposure control, adjustment of the mA and/or kV according to patient size and/or use of iterative reconstruction technique. COMPARISON:  CT head 02/05/2019. Cervical spine CT 10/20/2015 unavailable. FINDINGS: CT HEAD FINDINGS Brain: There is no evidence of acute intracranial hemorrhage, mass lesion, brain edema or extra-axial fluid collection. The ventricles and subarachnoid spaces are appropriately sized for age. There is no CT evidence of acute cortical infarction. Vascular: Mild intracranial atherosclerosis. No hyperdense vessel identified. Skull: Negative for fracture or focal lesion. Sinuses/Orbits: The visualized paranasal sinuses and mastoid air cells are clear. No orbital abnormalities are seen. Other: None. CT CERVICAL SPINE FINDINGS Alignment: Physiologic. Skull base and vertebrae: No evidence of acute cervical spine fracture or traumatic subluxation. Soft  tissues and spinal canal: No prevertebral fluid or swelling. No visible canal hematoma. Bilateral carotid atherosclerosis. Ossification of the ligamentum nuchae. Disc levels: Multilevel cervical spondylosis with disc sp

## 2022-02-02 NOTE — Progress Notes (Signed)
Discharge orders received, pt to discharge to [facility].  Report called to Jolaine Click at the facility, awaiting EMS to pick up patient. ?

## 2022-04-02 ENCOUNTER — Emergency Department: Payer: Medicare HMO

## 2022-04-02 ENCOUNTER — Observation Stay
Admission: EM | Admit: 2022-04-02 | Discharge: 2022-04-10 | Disposition: A | Discharge status: Skilled Nursing Facility | Payer: Medicare HMO | Attending: Internal Medicine | Admitting: Internal Medicine

## 2022-04-02 ENCOUNTER — Other Ambulatory Visit: Payer: Self-pay

## 2022-04-02 DIAGNOSIS — R0902 Hypoxemia: Secondary | ICD-10-CM | POA: Diagnosis not present

## 2022-04-02 DIAGNOSIS — Z7984 Long term (current) use of oral hypoglycemic drugs: Secondary | ICD-10-CM | POA: Insufficient documentation

## 2022-04-02 DIAGNOSIS — Z79899 Other long term (current) drug therapy: Secondary | ICD-10-CM | POA: Insufficient documentation

## 2022-04-02 DIAGNOSIS — I10 Essential (primary) hypertension: Secondary | ICD-10-CM | POA: Diagnosis present

## 2022-04-02 DIAGNOSIS — I739 Peripheral vascular disease, unspecified: Secondary | ICD-10-CM | POA: Diagnosis not present

## 2022-04-02 DIAGNOSIS — R918 Other nonspecific abnormal finding of lung field: Secondary | ICD-10-CM

## 2022-04-02 DIAGNOSIS — R296 Repeated falls: Principal | ICD-10-CM

## 2022-04-02 DIAGNOSIS — W19XXXA Unspecified fall, initial encounter: Principal | ICD-10-CM

## 2022-04-02 DIAGNOSIS — M25561 Pain in right knee: Secondary | ICD-10-CM | POA: Diagnosis not present

## 2022-04-02 DIAGNOSIS — D72829 Elevated white blood cell count, unspecified: Secondary | ICD-10-CM | POA: Diagnosis not present

## 2022-04-02 DIAGNOSIS — E1151 Type 2 diabetes mellitus with diabetic peripheral angiopathy without gangrene: Secondary | ICD-10-CM | POA: Diagnosis not present

## 2022-04-02 DIAGNOSIS — E559 Vitamin D deficiency, unspecified: Secondary | ICD-10-CM

## 2022-04-02 DIAGNOSIS — I252 Old myocardial infarction: Secondary | ICD-10-CM | POA: Diagnosis not present

## 2022-04-02 DIAGNOSIS — M25551 Pain in right hip: Secondary | ICD-10-CM | POA: Diagnosis not present

## 2022-04-02 DIAGNOSIS — Y92009 Unspecified place in unspecified non-institutional (private) residence as the place of occurrence of the external cause: Secondary | ICD-10-CM

## 2022-04-02 DIAGNOSIS — E119 Type 2 diabetes mellitus without complications: Secondary | ICD-10-CM

## 2022-04-02 DIAGNOSIS — N390 Urinary tract infection, site not specified: Secondary | ICD-10-CM

## 2022-04-02 LAB — CBC WITH DIFFERENTIAL/PLATELET
Abs Immature Granulocytes: 0.55 10*3/uL — ABNORMAL HIGH (ref 0.00–0.07)
Basophils Absolute: 0.1 10*3/uL (ref 0.0–0.1)
Basophils Relative: 1 %
Eosinophils Absolute: 0.4 10*3/uL (ref 0.0–0.5)
Eosinophils Relative: 2 %
HCT: 48.6 % — ABNORMAL HIGH (ref 36.0–46.0)
Hemoglobin: 15.6 g/dL — ABNORMAL HIGH (ref 12.0–15.0)
Immature Granulocytes: 3 %
Lymphocytes Relative: 6 %
Lymphs Abs: 1.2 10*3/uL (ref 0.7–4.0)
MCH: 27.2 pg (ref 26.0–34.0)
MCHC: 32.1 g/dL (ref 30.0–36.0)
MCV: 84.7 fL (ref 80.0–100.0)
Monocytes Absolute: 2 10*3/uL — ABNORMAL HIGH (ref 0.1–1.0)
Monocytes Relative: 9 %
Neutro Abs: 17.1 10*3/uL — ABNORMAL HIGH (ref 1.7–7.7)
Neutrophils Relative %: 79 %
Platelets: 337 10*3/uL (ref 150–400)
RBC: 5.74 MIL/uL — ABNORMAL HIGH (ref 3.87–5.11)
RDW: 15.8 % — ABNORMAL HIGH (ref 11.5–15.5)
WBC: 21.4 10*3/uL — ABNORMAL HIGH (ref 4.0–10.5)
nRBC: 0 % (ref 0.0–0.2)

## 2022-04-02 LAB — COMPREHENSIVE METABOLIC PANEL
ALT: 14 U/L (ref 0–44)
AST: 56 U/L — ABNORMAL HIGH (ref 15–41)
Albumin: 2.6 g/dL — ABNORMAL LOW (ref 3.5–5.0)
Alkaline Phosphatase: 465 U/L — ABNORMAL HIGH (ref 38–126)
Anion gap: 11 (ref 5–15)
BUN: 12 mg/dL (ref 8–23)
CO2: 26 mmol/L (ref 22–32)
Calcium: 9 mg/dL (ref 8.9–10.3)
Chloride: 97 mmol/L — ABNORMAL LOW (ref 98–111)
Creatinine, Ser: 0.51 mg/dL (ref 0.44–1.00)
GFR, Estimated: >60 mL/min (ref >60)
Glucose, Bld: 128 mg/dL — ABNORMAL HIGH (ref 70–99)
Potassium: 3.6 mmol/L (ref 3.5–5.1)
Sodium: 134 mmol/L — ABNORMAL LOW (ref 135–145)
Total Bilirubin: 1 mg/dL (ref 0.3–1.2)
Total Protein: 7.1 g/dL (ref 6.5–8.1)

## 2022-04-02 LAB — BRAIN NATRIURETIC PEPTIDE: B Natriuretic Peptide: 66.4 pg/mL (ref 0.0–100.0)

## 2022-04-02 LAB — CBG MONITORING, ED: Glucose-Capillary: 137 mg/dL — ABNORMAL HIGH (ref 70–99)

## 2022-04-02 LAB — TROPONIN I (HIGH SENSITIVITY): Troponin I (High Sensitivity): 21 ng/L — ABNORMAL HIGH (ref <18)

## 2022-04-02 MED ORDER — IOHEXOL 350 MG/ML SOLN
75.0000 mL | Freq: Once | INTRAVENOUS | Status: AC | PRN
  Administered 2022-04-03: 75 mL via INTRAVENOUS

## 2022-04-02 MED ORDER — MORPHINE SULFATE (PF) 4 MG/ML IV SOLN
4.0000 mg | Freq: Once | INTRAVENOUS | Status: AC
  Administered 2022-04-02: 4 mg via INTRAVENOUS
  Filled 2022-04-02: qty 1

## 2022-04-02 MED ORDER — OXYCODONE HCL 5 MG PO TABS: 5.0000 mg | ORAL_TABLET | Freq: Once | ORAL | Status: DC

## 2022-04-02 NOTE — ED Provider Notes (Addendum)
[facility] Provider Note    Event Date/Time   First MD Initiated Contact with Patient 04/02/22 2000     (approximate)   History   Fall   HPI  April [name] is a 76 y.o. female past medical history of diabetes hypertension peripheral vascular disease chronic leukocytosis who presents with pain after fall.  This morning patient was trying to get her clothes off when she had a mechanical fall onto her right knee.  Did not fall to the ground did not hit her head she was helped up by her home health.  She complains of pain in the right hip right knee and the right shoulder as she had recent right humerus fracture.  Denies neck pain headache denies chest pain shortness of breath cough fevers chills.     Past Medical History:  Diagnosis Date   Acute renal failure (ARF) ([place])    Difficult intubation    Hypertension    NSTEMI (non-ST elevated myocardial infarction) ([place])    Paroxysmal atrial fibrillation ([place])    Renal disorder    Sepsis [facility]) January 2017   [facility]   Type 2 diabetes mellitus [facility])     Patient Active Problem List   Diagnosis Date Noted   Mass of left lung    Closed displaced fracture of surgical neck of humerus, unspecified fracture morphology, initial encounter    Acute cystitis without hematuria    Abnormal CT of the chest    Hypoxia 01/28/2022   Lymphedema 07/02/2019   S/P right hip fracture 02/05/2019   PAD (peripheral artery disease) ([place]) 10/03/2017   Essential hypertension 10/03/2017   Pain in limb 03/21/2017   Swelling of limb 10/18/2016   Ulcer of calf ([place]) 10/18/2016   Gangrene of foot ([place]) 03/03/2016   UPJ (ureteropelvic junction) obstruction    Paroxysmal atrial fibrillation ([place]) 12/04/2015   Back pain 12/04/2015   Thrombocytopenia ([place]) 12/03/2015   Leukocytosis 12/03/2015   Type 2 diabetes mellitus ([place]) 12/03/2015   Respiratory failure ([place])    Proteus septicemia ([place])    Acute renal failure (ARF) ([place])     Hydronephrosis    Endotracheally intubated    Respiratory distress    Arterial hypotension    Sepsis ([place]) 10/20/2015   Acute renal failure (HCC)    Altered mental status    NSTEMI (non-ST elevated myocardial infarction) ([place])    Sepsis due to urinary tract infection [facility])      Physical Exam  Triage Vital Signs: ED Triage Vitals  Enc Vitals Group     BP 04/02/22 1956 139/74     Pulse Rate 04/02/22 1956 (!) 106     Resp 04/02/22 1956 (!) 22     Temp 04/02/22 1956 98 F (36.7 C)     Temp Source 04/02/22 1956 Oral     SpO2 04/02/22 1956 (!) 88 %     Weight 04/02/22 2000 214 lb 4.6 oz (97.2 kg)     Height 04/02/22 2000 5\' 6"  (1.676 m)     Head Circumference --      Peak Flow --      Pain Score 04/02/22 1959 7     Pain Loc --      Pain Edu? --      Excl. in [place]? --     Most recent vital signs: Vitals:   04/02/22 2100 04/02/22 2230  BP: 112/66 (!) 137/59  Pulse: 99 97  Resp: (!) 25 20  Temp:  SpO2: 90% 92%     General: Awake, patient is groaning in pain CV:  Good peripheral perfusion.  Status post TMA bilaterally Resp:  Normal effort.  No increased work of breathing Abd:  No distention.  Soft and nontender Neuro:             Awake, Alert, Oriented x 3  Other:  Bilateral knees without deformity or effusion able to range both sides, able to straight leg raise bilaterally able to range right hip without significant pain   ED Results / Procedures / Treatments  Labs (all labs ordered are listed, but only abnormal results are displayed) Labs Reviewed  COMPREHENSIVE METABOLIC PANEL - Abnormal; Notable for the following components:      Result Value   Sodium 134 (*)    Chloride 97 (*)    Glucose, Bld 128 (*)    Albumin 2.6 (*)    AST 56 (*)    Alkaline Phosphatase 465 (*)    All other components within normal limits  CBC WITH DIFFERENTIAL/PLATELET - Abnormal; Notable for the following components:   WBC 21.4 (*)    RBC 5.74 (*)    Hemoglobin 15.6 (*)    HCT 48.6  (*)    RDW 15.8 (*)    Neutro Abs 17.1 (*)    Monocytes Absolute 2.0 (*)    Abs Immature Granulocytes 0.55 (*)    All other components within normal limits  CBG MONITORING, ED - Abnormal; Notable for the following components:   Glucose-Capillary 137 (*)    All other components within normal limits  TROPONIN I (HIGH SENSITIVITY) - Abnormal; Notable for the following components:   Troponin I (High Sensitivity) 21 (*)    All other components within normal limits  URINE CULTURE  BLOOD GAS, VENOUS  BRAIN NATRIURETIC PEPTIDE  URINALYSIS, ROUTINE W REFLEX MICROSCOPIC     EKG  EKG interpreted by myself shows low voltage normal sinus rhythm right axis deviation no acute ischemic changes   RADIOLOGY Tray of the right hip interpreted by myself shows hardware no fracture or dislocation   PROCEDURES:  Critical Care performed: No  .1-3 Lead EKG Interpretation  Performed by: Rada Hay, MD Authorized by: Rada Hay, MD     Interpretation: normal     ECG rate assessment: normal     Rhythm: sinus rhythm     Ectopy: none     Conduction: normal     The patient is on the cardiac monitor to evaluate for evidence of arrhythmia and/or significant heart rate changes.   MEDICATIONS ORDERED IN ED: Medications  morphine (PF) 4 MG/ML injection 4 mg (4 mg Intravenous Given 04/02/22 2100)     IMPRESSION / MDM / ASSESSMENT AND PLAN / ED COURSE  I reviewed the triage vital signs and the nursing notes.                              Patient's presentation is most consistent with acute complicated illness / injury requiring diagnostic workup.  Differential diagnosis includes, but is not limited to, hip fracture, ligamentous knee injury, exacerbation of chronic pain  Patient is a 76 year old female multiple comorbidities and orthopedic injuries presents today after mechanical fall onto her right knee.  She complains of right shoulder pain right hip pain right knee pain.  The  right shoulder is recently fractured she was in rehab for a short time is now back at home  does have family that comes daily and is doing physical therapy.  She is notably hypoxic requiring 2 L consistently desatting to the high 80s.  Denies dyspnea cough or chest pain.  Exam is rather unremarkable.  Her knees are without deformity or swelling she is able to straight leg raise and can range the hip.  X-rays of the knee and hip do not have any acute pathology.  Chest x-ray also without acute pathology.  Patient recently admitted for hypoxia of unclear etiology at that time she had CT that was concerning for a mass but she did not want any further work-up.  This could be driving her underlying hypoxia today.  She is also minimally mobile PE certainly on the differential.  Did have CTA recently so do not feel strongly about repeating that again today.  Plan to obtain CBC BMP CMP VBG and troponin patient will require admission because of her low oxygen saturation.  Patient has leukocytosis of 21 this is similar to prior.  Troponin is 21 which is up from prior.  EKG is nonischemic, Sinus tach with borderline right axis.  Patient did have CTA about 2 months ago however with the persistent hypoxia elevated troponin no other explanation will obtain CTA.  Patient signed out pending imaging and admission.       FINAL CLINICAL IMPRESSION(S) / ED DIAGNOSES   Final diagnoses:  Fall, initial encounter  Hypoxia     Rx / DC Orders   ED Discharge Orders     None        Note:  This document was prepared using Dragon voice recognition software and may include unintentional dictation errors.   Rada Hay, MD 04/02/22 2201    Rada Hay, MD 04/02/22 [id number]

## 2022-04-02 NOTE — ED Triage Notes (Signed)
Pt arrives via [facility] from home with CC of mechanical fall at 1000 this morning. Pt reports 7/10 pain all over. Pt denies hitting head when falling.

## 2022-04-02 NOTE — ED Notes (Signed)
Pt yelling "help me, help me, hurry help me someone". This rn into room to provide assistance. Pt wanted mustard spread on her sandwich, task completed, pt reminded to use call bell.

## 2022-04-02 NOTE — ED Notes (Signed)
Pt provided with a sandwich tray.

## 2022-04-03 DIAGNOSIS — R0902 Hypoxemia: Secondary | ICD-10-CM

## 2022-04-03 DIAGNOSIS — Y92009 Unspecified place in unspecified non-institutional (private) residence as the place of occurrence of the external cause: Secondary | ICD-10-CM | POA: Diagnosis not present

## 2022-04-03 DIAGNOSIS — W19XXXA Unspecified fall, initial encounter: Secondary | ICD-10-CM | POA: Diagnosis not present

## 2022-04-03 DIAGNOSIS — R296 Repeated falls: Secondary | ICD-10-CM

## 2022-04-03 LAB — TROPONIN I (HIGH SENSITIVITY): Troponin I (High Sensitivity): 11 ng/L (ref <18)

## 2022-04-03 LAB — GLUCOSE, CAPILLARY
Glucose-Capillary: 152 mg/dL — ABNORMAL HIGH (ref 70–99)
Glucose-Capillary: 164 mg/dL — ABNORMAL HIGH (ref 70–99)
Glucose-Capillary: 148 mg/dL — ABNORMAL HIGH (ref 70–99)

## 2022-04-03 LAB — CBG MONITORING, ED
Glucose-Capillary: 172 mg/dL — ABNORMAL HIGH (ref 70–99)
Glucose-Capillary: 133 mg/dL — ABNORMAL HIGH (ref 70–99)

## 2022-04-03 LAB — BLOOD GAS, VENOUS
Acid-Base Excess: 3.9 mmol/L — ABNORMAL HIGH (ref 0.0–2.0)
Bicarbonate: 27.7 mmol/L (ref 20.0–28.0)
O2 Saturation: 94.1 %
Patient temperature: 37
pCO2, Ven: 38 mmHg — ABNORMAL LOW (ref 44–60)
pH, Ven: 7.47 — ABNORMAL HIGH (ref 7.25–7.43)
pO2, Ven: 64 mmHg — ABNORMAL HIGH (ref 32–45)

## 2022-04-03 LAB — URINALYSIS, ROUTINE W REFLEX MICROSCOPIC
Glucose, UA: 500 mg/dL — AB
Ketones, ur: 15 mg/dL — AB
Nitrite: NEGATIVE
Protein, ur: 100 mg/dL — AB
Specific Gravity, Urine: 1.02 (ref 1.005–1.030)
pH: 6.5 (ref 5.0–8.0)

## 2022-04-03 LAB — URINALYSIS, MICROSCOPIC (REFLEX)

## 2022-04-03 MED ORDER — LIDOCAINE 5 % EX PTCH
1.0000 | MEDICATED_PATCH | CUTANEOUS | Status: DC
  Administered 2022-04-03 – 2022-04-10 (×7): 1 via TRANSDERMAL
  Filled 2022-04-03 (×8): qty 1

## 2022-04-03 MED ORDER — PRAVASTATIN SODIUM 20 MG PO TABS
40.0000 mg | ORAL_TABLET | Freq: Every day | ORAL | Status: DC
  Administered 2022-04-03 – 2022-04-10 (×8): 40 mg via ORAL
  Filled 2022-04-03 (×8): qty 2

## 2022-04-03 MED ORDER — ACETAMINOPHEN 325 MG PO TABS
650.0000 mg | ORAL_TABLET | Freq: Four times a day (QID) | ORAL | Status: DC | PRN
  Administered 2022-04-03 – 2022-04-06 (×4): 650 mg via ORAL
  Filled 2022-04-03 (×4): qty 2

## 2022-04-03 MED ORDER — ENOXAPARIN SODIUM 60 MG/0.6ML IJ SOSY
0.5000 mg/kg | PREFILLED_SYRINGE | INTRAMUSCULAR | Status: DC
  Administered 2022-04-03 – 2022-04-10 (×8): 47.5 mg via SUBCUTANEOUS
  Filled 2022-04-03 (×8): qty 0.6

## 2022-04-03 MED ORDER — OXYCODONE HCL 5 MG PO TABS
5.0000 mg | ORAL_TABLET | Freq: Three times a day (TID) | ORAL | Status: DC | PRN
  Administered 2022-04-03 – 2022-04-07 (×10): 5 mg via ORAL
  Filled 2022-04-03 (×10): qty 1

## 2022-04-03 MED ORDER — INSULIN ASPART 100 UNIT/ML IJ SOLN: 0.0000 [IU] | Freq: Every day | INTRAMUSCULAR | Status: DC

## 2022-04-03 MED ORDER — ACETAMINOPHEN 650 MG RE SUPP: 650.0000 mg | Freq: Four times a day (QID) | RECTAL | Status: DC | PRN

## 2022-04-03 MED ORDER — TRAMADOL HCL 50 MG PO TABS: 50.0000 mg | ORAL_TABLET | Freq: Two times a day (BID) | ORAL | Status: DC | PRN

## 2022-04-03 MED ORDER — ONDANSETRON HCL 4 MG/2ML IJ SOLN
4.0000 mg | Freq: Four times a day (QID) | INTRAMUSCULAR | Status: DC | PRN
  Administered 2022-04-07: 4 mg via INTRAVENOUS
  Filled 2022-04-03: qty 2

## 2022-04-03 MED ORDER — DICLOFENAC SODIUM 1 % EX GEL
4.0000 g | Freq: Four times a day (QID) | CUTANEOUS | Status: DC
  Administered 2022-04-03 – 2022-04-10 (×23): 4 g via TOPICAL
  Filled 2022-04-03 (×3): qty 100

## 2022-04-03 MED ORDER — METFORMIN HCL 500 MG PO TABS
500.0000 mg | ORAL_TABLET | Freq: Two times a day (BID) | ORAL | Status: DC
  Administered 2022-04-05 – 2022-04-10 (×10): 500 mg via ORAL
  Filled 2022-04-03 (×11): qty 1

## 2022-04-03 MED ORDER — ONDANSETRON HCL 4 MG PO TABS: 4.0000 mg | ORAL_TABLET | Freq: Four times a day (QID) | ORAL | Status: DC | PRN

## 2022-04-03 MED ORDER — EMPAGLIFLOZIN 10 MG PO TABS
10.0000 mg | ORAL_TABLET | Freq: Every day | ORAL | Status: DC
  Administered 2022-04-03 – 2022-04-10 (×8): 10 mg via ORAL
  Filled 2022-04-03 (×8): qty 1

## 2022-04-03 MED ORDER — INSULIN ASPART 100 UNIT/ML IJ SOLN
0.0000 [IU] | Freq: Three times a day (TID) | INTRAMUSCULAR | Status: DC
  Administered 2022-04-03: 2 [IU] via SUBCUTANEOUS
  Administered 2022-04-03: 3 [IU] via SUBCUTANEOUS
  Administered 2022-04-03: 2 [IU] via SUBCUTANEOUS
  Administered 2022-04-04 (×2): 3 [IU] via SUBCUTANEOUS
  Administered 2022-04-04: 2 [IU] via SUBCUTANEOUS
  Administered 2022-04-05: 3 [IU] via SUBCUTANEOUS
  Administered 2022-04-05 – 2022-04-06 (×3): 2 [IU] via SUBCUTANEOUS
  Administered 2022-04-06: 3 [IU] via SUBCUTANEOUS
  Administered 2022-04-06 – 2022-04-07 (×2): 2 [IU] via SUBCUTANEOUS
  Administered 2022-04-07: 3 [IU] via SUBCUTANEOUS
  Administered 2022-04-07: 2 [IU] via SUBCUTANEOUS
  Administered 2022-04-08: 3 [IU] via SUBCUTANEOUS
  Administered 2022-04-08 – 2022-04-09 (×3): 2 [IU] via SUBCUTANEOUS
  Administered 2022-04-09: 3 [IU] via SUBCUTANEOUS
  Filled 2022-04-03 (×21): qty 1

## 2022-04-03 NOTE — ED Notes (Signed)
Pt refusing to keep oxygen nasal cannula on. Pt educated multiple times and nasal cannula placed back on pt. Pt continues to remove nasal cannula.

## 2022-04-03 NOTE — ED Notes (Signed)
Pt refused CT. MD in room to educate pt.

## 2022-04-03 NOTE — ED Notes (Signed)
Patient set up with breakfast tray and all condiments placed on food per patient. PAtient given milk, diet gingerale, and choc icecream per request

## 2022-04-03 NOTE — Assessment & Plan Note (Deleted)
Right knee pain  History of recent fall Fall precautions, PT eval, TOC consult. Might need placement

## 2022-04-03 NOTE — ED Provider Notes (Signed)
12:37 AM  Assumed care of patient at shift change.  Patient is a 76 year old female with history of obesity, hypertension, diabetes, peripheral vascular disease who presented to the emergency department after mechanical fall.  Imaging here shows no fracture or dislocation.  Patient has been persistently hypoxic on room air with sats as low as 85% while sitting upright and awake.  She denies chest pain or shortness of breath.  No fevers or cough.  It appears she was recently admitted to the hospital for hypoxia and had a CT scan that was concerning for lung mass but did not want further work-up/treatment.  Patient was not discharged from the hospital on oxygen.  We have recommended CTA to evaluate for pulmonary embolus, worsening mass, pneumonia, edema that could be causing hypoxia today but patient has refused multiple times.  She states if she did have a blood clot that she would not want treatment for it.  She states that she does not feel comfortable with going home because she lives alone and feels like she needs rehab.  Will discuss with hospitalist for the possibility of admission for lung mass with hypoxia, frequent falls with poor social situation requiring physical therapy, possible placement into rehab facility  1:39 AM Consulted and discussed patient's case with hospitalist, Dr. Damita Dunnings.  Discussed the case with the hospitalist and she agrees that patient can be admitted to the hospital.  I reviewed all nursing notes, vitals, pertinent previous records.  All labs, EKGs, imaging ordered have been independently reviewed and interpreted by myself.    Aleda Madl, Delice Bison, DO 04/03/22 [phone]

## 2022-04-03 NOTE — Assessment & Plan Note (Signed)
Continue metoprolol and pravastatin

## 2022-04-03 NOTE — H&P (Signed)
History and Physical    Patient: April [name] JHE:[id number] DOB: [birth year] DOA: 04/02/2022 DOS: the patient was seen and examined on 04/03/2022 PCP: Albina Billet, MD  Patient coming from: Home  Chief Complaint:  Chief Complaint  Patient presents with   Fall    HPI: April [name] is a 76 y.o. female who lives independently with medical history significant for DM 2, HTN, PVD, chronic leukocytosis of unclear etiology, hospitalized in May 2023 following a fall resulting in a fractured humerus, with incidental finding of a left lung mass suspicious for malignancy for which patient desired no further work-up with PET scan as recommended by oncology, who presents to the ED following a mechanical fall onto her right knee as she was trying to get her clothes off.  She did not hit her head nor did she have loss of consciousness.  She denied preceding lightheadedness, palpitations or shortness of breath, headache, visual disturbance or one-sided weakness numbness or tingling.  Following the fall she only complained of right hip and knee pain and right shoulder pain. ED course and data review: Afebrile, pulse 106-113 respirations 22-31 with O2 sat 88% on room air improving to 94% on 2 L, BP 139/74 Labs: WBC 21,000 up from baseline of 15-18,000 with hemoglobin of 15.  CMP significant for AST of 56 and alk phos of 465 otherwise unremarkable, troponin 11 and BNP 66.  VBG with pH 7.47 and PCO2 38. EKG, personally viewed and interpreted shows sinus tachycardia at 104 with nonspecific ST-T wave changes chest x-ray shows no acute abnormality CTA chest PE protocol pending  Patient treated with morphine for pain.  Hospitalist consulted due to persistent hypoxia and concern for safe discharge.    Past Medical History:  Diagnosis Date   Acute renal failure (ARF) ([place])    Difficult intubation    Hypertension    NSTEMI (non-ST elevated myocardial infarction) ([place])    Paroxysmal atrial fibrillation (HCC)     Renal disorder    Sepsis [facility]) January 2017   [facility]   Type 2 diabetes mellitus [facility])    Past Surgical History:  Procedure Laterality Date   broken hip  2019   CYSTOSCOPY WITH STENT PLACEMENT Right 11/03/2015   Procedure: CYSTOSCOPY WITH STENT PLACEMENT;  Surgeon: Hollice Espy, MD;  Location: [facility] ORS;  Service: Urology;  Laterality: Right;   FRACTURE SURGERY Right    Elbow   INTRAMEDULLARY (IM) NAIL INTERTROCHANTERIC Right 02/07/2019   Procedure: INTRAMEDULLARY (IM) NAIL INTERTROCHANTRIC;  Surgeon: Thornton Park, MD;  Location: [facility] ORS;  Service: Orthopedics;  Laterality: Right;   TONSILLECTOMY     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 She has never used smokeless tobacco. She reports that she does not drink alcohol and does not use drugs.  Allergies  Allergen Reactions   Prednisone Anaphylaxis   Other     Family History  Problem Relation Age of Onset   Diabetes Father    Heart disease Father    Stroke Maternal Grandmother     Prior to Admission medications   Medication Sig Start Date End Date Taking? Authorizing Provider  acetaminophen (TYLENOL) 500 MG tablet Take 1,000 mg by mouth every 8 (eight) hours as needed for moderate pain. Reported on 12/16/2015   Yes [provider]  diclofenac Sodium (VOLTAREN) 1 % GEL  Apply 4 g topically 4 (four) times daily. 02/04/22  Yes [provider]  docusate sodium (COLACE) 100 MG capsule Take 1 capsule (100 mg total) by mouth 2 (two) times daily. 12/10/15  Yes Dustin Flock, MD  empagliflozin (JARDIANCE) 10 MG TABS tablet Take by mouth daily.   Yes [provider]  metFORMIN (GLUCOPHAGE) 500 MG tablet Take 500 mg by mouth 2 (two) times daily.  09/13/17  Yes [provider]  metoprolol tartrate (LOPRESSOR) 25 MG  tablet Take 1 tablet (25 mg total) by mouth 2 (two) times daily. 11/09/15  Yes Sainani, Belia Heman, MD  oxyCODONE 7.5 MG TABS Take 7.5 mg by mouth every 4 (four) hours as needed for severe pain or moderate pain. 02/02/22  Yes Fritzi Mandes, MD  polyethylene glycol [facility] / GLYCOLAX) packet Take 17 g by mouth daily. Patient taking differently: Take 17 g by mouth daily as needed. 12/10/15  Yes Dustin Flock, MD  pravastatin (PRAVACHOL) 40 MG tablet Take 40 mg by mouth daily. 11/23/21  Yes [provider]  pregabalin (LYRICA) 75 MG capsule Take 1 capsule (75 mg total) by mouth 2 (two) times daily. 08/17/21  Yes Kris Hartmann, NP  traMADol (ULTRAM) 50 MG tablet Take 50 mg by mouth 2 (two) times daily as needed. 03/21/22  Yes [provider]  oxyCODONE (OXY IR/ROXICODONE) 5 MG immediate release tablet Take 5 mg by mouth 3 (three) times daily as needed. Patient not taking: Reported on 04/03/2022 04/01/22   [provider]    Physical Exam: Vitals:   04/02/22 2330 04/03/22 0000 04/03/22 0035 04/03/22 0036  BP: 122/72 132/60    Pulse: (!) 107 (!) 108 (!) 113 (!) 110  Resp: (!) 31 19 (!) 22 18  Temp:      TempSrc:      SpO2: 95% 92% (!) 88% 94%  Weight:      Height:       Physical Exam Vitals and nursing note reviewed.  Constitutional:      General: She is not in acute distress.    Appearance: She is ill-appearing.  HENT:     Head: Normocephalic and atraumatic.  Cardiovascular:     Rate and Rhythm: Regular rhythm. Tachycardia present.     Heart sounds: Normal heart sounds.  Pulmonary:     Effort: Tachypnea present.     Breath sounds: Normal breath sounds.  Abdominal:     Palpations: Abdomen is soft.     Tenderness: There is no abdominal tenderness.  Neurological:     Mental Status: Mental status is at baseline.     Labs on Admission: I have personally reviewed following labs and imaging studies  CBC: Recent Labs  Lab 04/02/22 2007  WBC 21.4*  NEUTROABS  17.1*  HGB 15.6*  HCT 48.6*  MCV 84.7  PLT 203   Basic Metabolic Panel: Recent Labs  Lab 04/02/22 2007  NA 134*  K 3.6  CL 97*  CO2 26  GLUCOSE 128*  BUN 12  CREATININE 0.51  CALCIUM 9.0   GFR: Estimated Creatinine Clearance: 71.5 mL/min (by C-G formula based on SCr of 0.51 mg/dL). Liver Function Tests: Recent Labs  Lab 04/02/22 2007  AST 56*  ALT 14  ALKPHOS 465*  BILITOT 1.0  PROT 7.1  ALBUMIN 2.6*   No results for input(s): "LIPASE", "AMYLASE" in the last 168 hours. No results for input(s): "AMMONIA" in the last 168 hours. Coagulation Profile: No results for input(s): "INR", "PROTIME" in the  last 168 hours. Cardiac Enzymes: No results for input(s): "CKTOTAL", "CKMB", "CKMBINDEX", "TROPONINI" in the last 168 hours. BNP (last 3 results) No results for input(s): "PROBNP" in the last 8760 hours. HbA1C: No results for input(s): "HGBA1C" in the last 72 hours. CBG: Recent Labs  Lab 04/02/22 1954  GLUCAP 137*   Lipid Profile: No results for input(s): "CHOL", "HDL", "LDLCALC", "TRIG", "CHOLHDL", "LDLDIRECT" in the last 72 hours. Thyroid Function Tests: No results for input(s): "TSH", "T4TOTAL", "FREET4", "T3FREE", "THYROIDAB" in the last 72 hours. Anemia Panel: No results for input(s): "VITAMINB12", "FOLATE", "FERRITIN", "TIBC", "IRON", "RETICCTPCT" in the last 72 hours. Urine analysis:    Component Value Date/Time   COLORURINE YELLOW (A) 01/28/2022 1544   APPEARANCEUR HAZY (A) 01/28/2022 1544   LABSPEC 1.022 01/28/2022 1544   PHURINE 5.0 01/28/2022 1544   GLUCOSEU >=500 (A) 01/28/2022 1544   HGBUR NEGATIVE 01/28/2022 1544   [place] 01/28/2022 1544   KETONESUR 5 (A) 01/28/2022 1544   PROTEINUR NEGATIVE 01/28/2022 1544   NITRITE POSITIVE (A) 01/28/2022 1544   LEUKOCYTESUR TRACE (A) 01/28/2022 1544    Radiological Exams on Admission: DG Chest Portable 1 View  Result Date: 04/02/2022 CLINICAL DATA:  Recent fall with right-sided pain, initial  encounter EXAM: PORTABLE CHEST 1 VIEW COMPARISON:  01/28/2022 FINDINGS: Cardiac shadow is within normal limits. Aortic calcifications are again seen. Increased vascular congestion and mild edema is noted. No focal confluent infiltrate is seen. Old rib fractures are noted bilaterally with healing. IMPRESSION: No acute abnormality noted. Electronically Signed   By: Inez Catalina M.D.   On: 04/02/2022 21:50   DG Knee 2 Views Right  Result Date: 04/02/2022 CLINICAL DATA:  Right leg pain following fall, initial encounter EXAM: RIGHT KNEE - 2 VIEW COMPARISON:  None Available. FINDINGS: Postsurgical changes in the distal femur are noted. No acute fracture is noted. IMPRESSION: No acute abnormality noted. Electronically Signed   By: Inez Catalina M.D.   On: 04/02/2022 21:49   DG Hip Unilat W or Wo Pelvis 2-3 Views Right  Result Date: 04/02/2022 CLINICAL DATA:  Right-sided hip pain following fall several hours ago, initial encounter EXAM: DG HIP (WITH OR WITHOUT PELVIS) 2-3V RIGHT COMPARISON:  02/07/2019 FINDINGS: Pelvic ring is intact. Postsurgical changes in the proximal right femur are noted. Degenerative changes of the right hip joint are noted. No acute fracture or dislocation is seen. No soft tissue changes are noted. IMPRESSION: Degenerative change in the right hip joint. No acute abnormality is noted. Electronically Signed   By: Inez Catalina M.D.   On: 04/02/2022 21:48     Data Reviewed: Relevant notes from primary care and specialist visits, past discharge summaries as available in EHR, including Care Everywhere. Prior diagnostic testing as pertinent to current admission diagnoses Updated medications and problem lists for reconciliation ED course, including vitals, labs, imaging, treatment and response to treatment Triage notes, nursing and pharmacy notes and ED provider's notes Notable results as noted in HPI   Assessment and Plan: Frequent falls Right knee pain secondary to fall History of fall  in May 2023 resulting in fracture of the humerus Patient lives alone Fall precautions, PT eval, TOC consult. Might need placement   Mass of lower lobe of left lung on CT chest 01/30/22 Hypoxia  Likely secondary to lung mass suspicious for malignancy.  O2 sats in the mid 80s off oxygen in the ED Supplemental oxygen to keep sats over 92% Patient desires no further work-up of the lung mass Was seen  by oncology on 5/7 and was not interested in recommended PET scan  Leukocytosis Slightly more elevated than baseline.  Etiology uncertain.  No additional stigmata of acute infection Continue to monitor We will get UA to evaluate for UTI Holding off on antibiotics for now  Essential hypertension Continue metoprolol  PAD (peripheral artery disease) (HCC) Continue metoprolol and pravastatin  Type 2 diabetes mellitus ([place]) Continue home metformin and Jardiance with sliding scale coverage    DVT prophylaxis: Lovenox  Consults: none  Advance Care Planning:   Code Status: Prior   Family Communication: none  Disposition Plan: Back to previous home environment  Severity of Illness: The appropriate patient status for this patient is INPATIENT. Inpatient status is judged to be reasonable and necessary in order to provide the required intensity of service to ensure the patient's safety. The patient's presenting symptoms, physical exam findings, and initial radiographic and laboratory data in the context of their chronic comorbidities is felt to place them at high risk for further clinical deterioration. Furthermore, it is not anticipated that the patient will be medically stable for discharge from the hospital within 2 midnights of admission.   * I certify that at the point of admission it is my clinical judgment that the patient will require inpatient hospital care spanning beyond 2 midnights from the point of admission due to high intensity of service, high risk for further deterioration and  high frequency of surveillance required.*  Author: Athena Masse, MD 04/03/2022 1:49 AM  For on call review [email or web address].

## 2022-04-03 NOTE — Progress Notes (Signed)
This is a no charge note as patient was admitted this AM.  Patient seen and examined.  H&P reviewed.  April [name] is a 76 y.o. female who lives independently with medical history significant for DM 2, HTN, PVD, chronic leukocytosis of unclear etiology, hospitalized in May 2023 following a fall resulting in a fractured humerus, with incidental finding of a left lung mass suspicious for malignancy for which patient desired no further work-up with PET scan as recommended by oncology, who presents to the ED following a mechanical fall onto her right knee as she was trying to get her clothes off.  She did not hit her head nor did she have loss of consciousness.  She denied preceding lightheadedness, palpitations or shortness of breath, headache, visual disturbance or one-sided weakness numbness or tingling.  Following the fall she only complained of right hip and knee pain and right shoulder pain. ED course and data review: Afebrile, pulse 106-113 respirations 22-31 with O2 sat 88% on room air improving to 94% on 2 L, BP 139/74 Labs: WBC 21,000 up from baseline of 15-18,000 with hemoglobin of 15.  CMP significant for AST of 56 and alk phos of 465 otherwise unremarkable, troponin 11 and BNP 66.  VBG with pH 7.47 and PCO2 38.  CTA  1. No evidence of pulmonary embolus. 2. Left lower lobe pulmonary mass and bilateral solid pulmonary nodules, concerning for metastatic disease. Consider CT of the abdomen and pelvis to assess for primary malignancy. 3. Age indeterminate moderate compression deformity of T6. Correlate for point tenderness. 4. Aortic Atherosclerosis (ICD10-I70.0  C/o rt shoulder pain. Has ice pack on it. No sob or cp.  Cta  anteriorly Reg s1/s2 Soft benign No edema  A/P Discussed with patient about consulting palliative care for goals of care and to help provide patient with support at home-agreeable PT /OT Discussed the results of ct scan with the patient.

## 2022-04-03 NOTE — ED Notes (Signed)
Patient pulled up in bed and situated. Purwick in place

## 2022-04-03 NOTE — ED Notes (Signed)
Patient given saltine crackers and two cups of ice water

## 2022-04-03 NOTE — Assessment & Plan Note (Signed)
Continue metoprolol. 

## 2022-04-03 NOTE — Progress Notes (Signed)
Anticoagulation monitoring(Lovenox):  76 yo  female ordered Lovenox 40 mg Q24h    Filed Weights   04/02/22 2000  Weight: 97.2 kg (214 lb 4.6 oz)   BMI 34.59   Lab Results  Component Value Date   CREATININE 0.51 04/02/2022   CREATININE 0.92 01/29/2022   CREATININE 0.72 01/28/2022   Estimated Creatinine Clearance: 71.5 mL/min (by C-G formula based on SCr of 0.51 mg/dL). Hemoglobin & Hematocrit     Component Value Date/Time   HGB 15.6 (H) 04/02/2022 2007   HCT 48.6 (H) 04/02/2022 2007     Per Protocol for Patient with estCrcl > 30 ml/min and BMI > 30, will transition to Lovenox 47.5 mg Q24h.

## 2022-04-03 NOTE — Progress Notes (Signed)
[place] visited patient. Patient was alert upon arrival but expressed pain all over body. Patient was talkative but shared that she needed sleep. Spiritual care was provided through prayer upon request. Follow up visits were requested.   Rev. Evelena Asa, M.Div. Healthcare Chaplain

## 2022-04-03 NOTE — Assessment & Plan Note (Addendum)
Slightly more elevated than baseline.  Etiology uncertain.  No additional stigmata of acute infection Continue to monitor We will get UA to evaluate for UTI Holding off on antibiotics for now

## 2022-04-03 NOTE — Assessment & Plan Note (Addendum)
Continue home metformin and Jardiance with sliding scale coverage

## 2022-04-03 NOTE — Assessment & Plan Note (Addendum)
Hypoxia  Likely secondary to lung mass suspicious for malignancy.  O2 sats in the mid 80s off oxygen in the ED Supplemental oxygen to keep sats over 92% Patient desires no further work-up of the lung mass Was seen by oncology on 5/7 and was not interested in recommended PET scan

## 2022-04-03 NOTE — Assessment & Plan Note (Signed)
Right knee pain secondary to fall History of fall in May 2023 resulting in fracture of the humerus Patient lives alone Fall precautions, PT eval, TOC consult. Might need placement

## 2022-04-04 DIAGNOSIS — W19XXXA Unspecified fall, initial encounter: Secondary | ICD-10-CM | POA: Diagnosis not present

## 2022-04-04 DIAGNOSIS — I10 Essential (primary) hypertension: Secondary | ICD-10-CM

## 2022-04-04 DIAGNOSIS — Y92009 Unspecified place in unspecified non-institutional (private) residence as the place of occurrence of the external cause: Secondary | ICD-10-CM | POA: Diagnosis not present

## 2022-04-04 LAB — GLUCOSE, CAPILLARY
Glucose-Capillary: 157 mg/dL — ABNORMAL HIGH (ref 70–99)
Glucose-Capillary: 141 mg/dL — ABNORMAL HIGH (ref 70–99)
Glucose-Capillary: 178 mg/dL — ABNORMAL HIGH (ref 70–99)
Glucose-Capillary: 177 mg/dL — ABNORMAL HIGH (ref 70–99)

## 2022-04-04 MED ORDER — ADULT MULTIVITAMIN W/MINERALS CH
1.0000 | ORAL_TABLET | Freq: Every day | ORAL | Status: DC
Start: 2022-04-05 — End: 2022-04-10
  Administered 2022-04-05 – 2022-04-10 (×6): 1 via ORAL
  Filled 2022-04-04 (×6): qty 1

## 2022-04-04 MED ORDER — ENSURE MAX PROTEIN PO LIQD
11.0000 [oz_av] | Freq: Three times a day (TID) | ORAL | Status: DC
  Administered 2022-04-04 – 2022-04-09 (×15): 11 [oz_av] via ORAL
  Filled 2022-04-04: qty 330

## 2022-04-04 NOTE — Care Management CC44 (Signed)
Condition Code 44 Documentation Completed  Patient Details  Name: April [name] MRN: [id number] Date of Birth: 07/18/1946   Condition Code 44 given:   yes Patient signature on Condition Code 44 notice:  patient verbally acknowledge.  Documentation of 2 MD's agreement:  Yes per Utilization review  Code 44 added to claim:   Yes.    Pete Pelt, RN 04/04/2022, 2:09 PM

## 2022-04-04 NOTE — NC FL2 (Signed)
[place] LEVEL OF CARE SCREENING TOOL     IDENTIFICATION  Patient Name: April [name] Birthdate: Sep 18, 1946 Sex: female Admission Date (Current Location): 04/02/2022  [facility] and [place] Number:  Engineering geologist and Address:  [facility], [address] [place], [place] [address]      Provider Number: [id number]  Attending Physician Name and Address:  Nolberto Hanlon, MD  Relative Name and Phone Number:  Dorinda Hill [phone]     Maudry Diego- son of Deneen Harts    Current Level of Care: Hospital Recommended Level of Care: [place] Prior Approval Number:    Date Approved/Denied:   PASRR Number: [id number]  Discharge Plan: SNF    Current Diagnoses: Patient Active Problem List   Diagnosis Date Noted   Fall at home, initial encounter 04/03/2022   Frequent falls 04/03/2022   Mass of lower lobe of left lung on CT chest 01/30/22    Closed displaced fracture of surgical neck of humerus, unspecified fracture morphology, initial encounter    Acute cystitis without hematuria    Abnormal CT of the chest    Hypoxia 01/28/2022   Lymphedema 07/02/2019   S/P right hip fracture 02/05/2019   PAD (peripheral artery disease) ([place]) 10/03/2017   Essential hypertension 10/03/2017   Pain in limb 03/21/2017   Swelling of limb 10/18/2016   Ulcer of calf ([place]) 10/18/2016   Gangrene of foot ([place]) 03/03/2016   UPJ (ureteropelvic junction) obstruction    Paroxysmal atrial fibrillation ([place]) 12/04/2015   Back pain 12/04/2015   Thrombocytopenia ([place]) 12/03/2015   Leukocytosis 12/03/2015   Type 2 diabetes mellitus ([place]) 12/03/2015   Respiratory failure ([place])    Proteus septicemia ([place])    Acute renal failure (ARF) ([place])    Hydronephrosis    Endotracheally intubated    Respiratory distress    Arterial hypotension    Sepsis ([place]) 10/20/2015   Acute renal failure (HCC)    Altered mental status    NSTEMI (non-ST elevated myocardial  infarction) ([place])    Sepsis due to urinary tract infection ([place])     Orientation RESPIRATION BLADDER Height & Weight     Self, Time, Situation, Place  Normal (94% on room air) Incontinent Weight: 97.2 kg Height:  5\' 6"  (167.6 cm)  BEHAVIORAL SYMPTOMS/MOOD NEUROLOGICAL BOWEL NUTRITION STATUS        Diet (Heart healthy carb modified)  AMBULATORY STATUS COMMUNICATION OF NEEDS Skin    (deferred assessment due to dizziness) Verbally Other (Comment) (erythema to perenium)                       Personal Care Assistance Level of Assistance  Bathing, Feeding, Dressing (A lot of help with activities) Bathing Assistance: Limited assistance (Limited use of RUE) Feeding assistance: Limited assistance Dressing Assistance: Limited assistance     Functional Limitations Info  Sight, Hearing, Speech Sight Info: Impaired Hearing Info: Adequate Speech Info: Adequate    SPECIAL CARE FACTORS FREQUENCY  PT (By licensed PT), OT (By licensed OT)     PT Frequency: Min 5x weekly OT Frequency: Min 5x weekly            Contractures Contractures Info: Not present    Additional Factors Info  Code Status, Allergies Code Status Info: FULL CODE Allergies Info: Prednisone High  Anaphylaxis   Other Not Specified           Current Medications (04/04/2022):  This is the current hospital active  medication list Current Facility-Administered Medications  Medication Dose Route Frequency Provider Last Rate Last Admin   acetaminophen (TYLENOL) tablet 650 mg  650 mg Oral Q6H PRN Athena Masse, MD   650 mg at 04/03/22 2038   Or   acetaminophen (TYLENOL) suppository 650 mg  650 mg Rectal Q6H PRN Athena Masse, MD       diclofenac Sodium (VOLTAREN) 1 % topical gel 4 g  4 g Topical QID Judd Gaudier V, MD   4 g at 04/04/22 0856   empagliflozin (JARDIANCE) tablet 10 mg  10 mg Oral Daily Judd Gaudier V, MD   10 mg at 04/04/22 0855   enoxaparin (LOVENOX) injection 47.5 mg  0.5 mg/kg Subcutaneous Q24H  Judd Gaudier V, MD   47.5 mg at 04/04/22 0856   insulin aspart (novoLOG) injection 0-15 Units  0-15 Units Subcutaneous TID WC Athena Masse, MD   2 Units at 04/04/22 1223   insulin aspart (novoLOG) injection 0-5 Units  0-5 Units Subcutaneous QHS Athena Masse, MD       lidocaine (LIDODERM) 5 % 1 patch  1 patch Transdermal Q24H Nolberto Hanlon, MD   1 patch at 04/04/22 0855   [START ON 04/05/2022] metFORMIN (GLUCOPHAGE) tablet 500 mg  500 mg Oral BID WC Athena Masse, MD       ondansetron [facility]) tablet 4 mg  4 mg Oral Q6H PRN Athena Masse, MD       Or   ondansetron [facility]) injection 4 mg  4 mg Intravenous Q6H PRN Athena Masse, MD       oxyCODONE (Oxy IR/ROXICODONE) immediate release tablet 5 mg  5 mg Oral TID PRN Nolberto Hanlon, MD   5 mg at 04/04/22 0241   pravastatin (PRAVACHOL) tablet 40 mg  40 mg Oral Daily Athena Masse, MD   40 mg at 04/04/22 9233     Discharge Medications: Please see discharge summary for a list of discharge medications.  Relevant Imaging Results:  Relevant Lab Results:   Additional Information SSN [id number]  Pete Pelt, RN

## 2022-04-04 NOTE — Evaluation (Signed)
Physical Therapy Evaluation Patient Details Name: April [name] MRN: [id number] DOB: 06/25/1946 Today's Date: 04/04/2022  History of Present Illness  Per MD H&P: April [name] is a 76 y.o. female who lives independently with medical history significant for DM 2, HTN, PVD, chronic leukocytosis of unclear etiology, hospitalized in May 2023 following a fall resulting in a fractured humerus, with incidental finding of a left lung mass suspicious for malignancy for which patient desired no further work-up with PET scan as recommended by oncology, who presents to the ED following a mechanical fall onto her right knee as she was trying to get her clothes off.  She did not hit her head nor did she have loss of consciousness.  She denied preceding lightheadedness, palpitations or shortness of breath, headache, visual disturbance or one-sided weakness numbness or tingling.  Following the fall she only complained of right hip and knee pain and right shoulder pain.   Clinical Impression  Pt admitted with above diagnosis. Pt received upright in bed agreeable to PT with encouragement. Pt remains donned in R shoulder s ling after being s/p R humeral fracture ~2  months ago. Currently during eval no noted orthopedic notes or orders for WB status. Pt reports being able to mobilize R shoulder, doesn't know if she has WB precautions or not. Wearing sling due to recent fall leading to current admission.    To date, pt relying on HOB maximally and modA at torso to transfer to EOB. Able to initiate LE's and utilize LUE on bed rail to assist but minimally able to complete. Also noted modA on chuck pad to scoot anteriorly for feet to reach floor. Upon sitting EOB pt reporting moderate dizziness requesting to lie down. SPO2 > 90% noted. Pt maxA to return to supine in bed with pt reporting improvement in dizziness in supine. Unable to assess BP as no dynamap present in room. Anticipate pt will require STR placement at d/c as pt  requiring moderate assist for bed mobility and unable to stand at this time which is well below pt's baseline. Pt in supine with all needs in reach. Pt currently with functional limitations due to the deficits listed below (see PT Problem List). Pt will benefit from skilled PT to increase their independence and safety with mobility to allow discharge to the venue listed below.     Recommendations for follow up therapy are one component of a multi-disciplinary discharge planning process, led by the attending physician.  Recommendations may be updated based on patient status, additional functional criteria and insurance authorization.  Follow Up Recommendations Skilled nursing-short term rehab (<3 hours/day) Can patient physically be transported by private vehicle: No    Assistance Recommended at Discharge Intermittent Supervision/Assistance  Patient can return home with the following  A lot of help with walking and/or transfers;A lot of help with bathing/dressing/bathroom;Assist for transportation;Assistance with cooking/housework;Help with stairs or ramp for entrance    Equipment Recommendations Other (comment) (tbd by next venue of care)  Recommendations for Other Services       Functional Status Assessment Patient has had a recent decline in their functional status and demonstrates the ability to make significant improvements in function in a reasonable and predictable amount of time.     Precautions / Restrictions Precautions Precautions: Fall;Shoulder Type of Shoulder Precautions: Pt 2 months out humeral fracture. Per Dr. Sharlet [place], allowed gentle strengthening and shoulder mobility. No specifics WB precautions Precaution Booklet Issued: No Restrictions Weight Bearing Restrictions: No  Mobility  Bed Mobility Overal bed mobility: Needs Assistance Bed Mobility: Supine to Sit     Supine to sit: HOB elevated     General bed mobility comments: HOB maximally elevated, minimal  use of RUE. Patient Response: Cooperative  Transfers Overall transfer level: Needs assistance Equipment used: Rolling walker (2 wheels) Transfers: Sit to/from Stand             General transfer comment: deferred due to dizziness    Ambulation/Gait               General Gait Details: deferred due to dizziness  Stairs            Wheelchair Mobility    Modified Rankin (Stroke Patients Only)       Balance Overall balance assessment: Needs assistance Sitting-balance support: Feet supported, Single extremity supported Sitting balance-Leahy Scale: Fair         Standing balance comment: unable due to dizziness                             Pertinent Vitals/Pain Pain Assessment Pain Assessment: Faces Faces Pain Scale: Hurts a little bit Pain Location: R shoulder Pain Descriptors / Indicators: Aching, Discomfort, Dull, Guarding, Grimacing Pain Intervention(s): Limited activity within patient's tolerance, Monitored during session, Premedicated before session    Home Living Family/patient expects to be discharged to:: Private residence Living Arrangements: Alone Available Help at Discharge: Friend(s);Personal care attendant (1x/week) Type of Home: House Home Access: Stairs to enter Entrance Stairs-Rails: Right;Left;Can reach both Entrance Stairs-Number of Steps: 5   Home Layout: One level Home Equipment: Conservation officer, nature (2 wheels);Cane - single point;Rollator (4 wheels);Grab bars - tub/shower;Adaptive equipment;BSC/3in1;Shower seat;Other (comment)      Prior Function Prior Level of Function : Independent/Modified Independent;Needs assist       Physical Assist : ADLs (physical)     Mobility Comments: Relies on rollator for mobility ADLs Comments: pt reports MOD I with all ADL, aid or neighbors assist with IADLs as needed     Hand Dominance   Dominant Hand: Right    Extremity/Trunk Assessment   Upper Extremity Assessment Upper  Extremity Assessment: RUE deficits/detail RUE Deficits / Details: s/p 2 months R humeral fracture, limited mobility, still wearing sling    Lower Extremity Assessment Lower Extremity Assessment: Generalized weakness       Communication   Communication: No difficulties  Cognition Arousal/Alertness: Awake/alert Behavior During Therapy: WFL for tasks assessed/performed Overall Cognitive Status: Within Functional Limits for tasks assessed                                          General Comments      Exercises Other Exercises Other Exercises: Role of PT in acute setting, d/c recs.   Assessment/Plan    PT Assessment Patient needs continued PT services  PT Problem List Decreased strength;Decreased range of motion;Decreased activity tolerance;Decreased balance;Decreased knowledge of precautions;Pain;Decreased mobility       PT Treatment Interventions DME instruction;Balance training;Gait training;Neuromuscular re-education;Stair training;Functional mobility training;Patient/family education;Therapeutic activities;Therapeutic exercise    PT Goals (Current goals can be found in the Care Plan section)  Acute Rehab PT Goals Patient Stated Goal: improve mobility PT Goal Formulation: With patient Time For Goal Achievement: 04/18/22 Potential to Achieve Goals: Fair    Frequency Min 2X/week     Co-evaluation  AM-PAC PT "6 Clicks" Mobility  Outcome Measure Help needed turning from your back to your side while in a flat bed without using bedrails?: A Lot Help needed moving from lying on your back to sitting on the side of a flat bed without using bedrails?: A Lot Help needed moving to and from a bed to a chair (including a wheelchair)?: A Lot Help needed standing up from a chair using your arms (e.g., wheelchair or bedside chair)?: Total Help needed to walk in hospital room?: Total Help needed climbing 3-5 steps with a railing? : Total 6 Click  Score: 9    End of Session Equipment Utilized During Treatment: Oxygen Activity Tolerance: Treatment limited secondary to medical complications (Comment) (dizziness) Patient left: in bed Nurse Communication: Mobility status PT Visit Diagnosis: Muscle weakness (generalized) (M62.81);Difficulty in walking, not elsewhere classified (R26.2)    Time: 4373-5789 PT Time Calculation (min) (ACUTE ONLY): 24 min   Charges:     PT Treatments $Therapeutic Activity: 8-22 mins       Alixandra Alfieri M. Fairly IV, PT, DPT Physical Therapist- [facility]  04/04/2022, 10:34 AM

## 2022-04-04 NOTE — Evaluation (Signed)
Occupational Therapy Evaluation Patient Details Name: April [name] MRN: [id number] DOB: 1946/08/13 Today's Date: 04/04/2022   History of Present Illness Per MD H&P: April [name] is a 76 y.o. female who lives independently with medical history significant for DM 2, HTN, PVD, chronic leukocytosis of unclear etiology, hospitalized in May 2023 following a fall resulting in a fractured humerus, with incidental finding of a left lung mass suspicious for malignancy for which patient desired no further work-up with PET scan as recommended by oncology, who presents to the ED following a mechanical fall onto her right knee as she was trying to get her clothes off.  She did not hit her head nor did she have loss of consciousness.  She denied preceding lightheadedness, palpitations or shortness of breath, headache, visual disturbance or one-sided weakness numbness or tingling.  Following the fall she only complained of right hip and knee pain and right shoulder pain.   Clinical Impression   [name] presents with generalized weakness, limited endurance, R-sided pain, impaired RUE use, and confusion. She was at [facility] for several weeks following humeral fx in May 2023, then living at home with caregivers available 4 hrs/day, 7 days/wk. During today's evaluation, pt has difficultly describing her PLOF and living situation, displays a high degree of anxiety regarding use of RUE, and requires Mod-Max A for bed mobility. She declines any OOB mobility, expressing fear of falling. She endorses dizziness and SOB; 02 sats remain in upper 90s. Pt appears far from her baseline level of fxl mobility and ADL performance. Recommend ongoing OT while hospitalized, with DC to SNF to improve strength, flexibility, balance, and confidence with ambulation. Pt agrees with this recommendation but states that she would like to go back to a different facility than the one she went to for rehab in May of this year.    Recommendations for follow up therapy are one component of a multi-disciplinary discharge planning process, led by the attending physician.  Recommendations may be updated based on patient status, additional functional criteria and insurance authorization.   Follow Up Recommendations  Skilled nursing-short term rehab (<3 hours/day)    Assistance Recommended at Discharge    Patient can return home with the following A lot of help with walking and/or transfers;A lot of help with bathing/dressing/bathroom;Assistance with cooking/housework;Direct supervision/assist for medications management    Functional Status Assessment  Patient has had a recent decline in their functional status and demonstrates the ability to make significant improvements in function in a reasonable and predictable amount of time.  Equipment Recommendations  None recommended by OT    Recommendations for Other Services       Precautions / Restrictions Precautions Precautions: Fall;Shoulder Type of Shoulder Precautions: Pt 2 months out humeral fracture. Per Dr. Sharlet Salina, allowed gentle strengthening and shoulder mobility. Sling no longer required, no WB restrictions Restrictions Weight Bearing Restrictions: No      Mobility Bed Mobility Overal bed mobility: Needs Assistance Bed Mobility: Supine to Sit, Sit to Supine     Supine to sit: HOB elevated, Max assist Sit to supine: HOB elevated, Max assist   General bed mobility comments: HOB maximally elevated, minimal use of RUE. Patient Response: Anxious  Transfers Overall transfer level: Needs assistance                 General transfer comment: deferred due to SOB, dizziness      Balance Overall balance assessment: Needs assistance Sitting-balance support: Feet supported, Single extremity supported Sitting  balance-Leahy Scale: Fair         Standing balance comment: unable; pt reports dizziness, SOB, displays anxiety                            ADL either performed or assessed with clinical judgement   ADL Overall ADL's : Needs assistance/impaired                                       General ADL Comments: Anticipate Max A for OOB fxl mobility     Vision         Perception     Praxis      Pertinent Vitals/Pain Pain Assessment Faces Pain Scale: Hurts even more Pain Location: R hip, gas pain Pain Descriptors / Indicators: Aching, Discomfort, Dull, Guarding, Grimacing Pain Intervention(s): Limited activity within patient's tolerance, Repositioned, RN gave pain meds during session     Hand Dominance Right   Extremity/Trunk Assessment Upper Extremity Assessment Upper Extremity Assessment: RUE deficits/detail;Generalized weakness RUE Deficits / Details: s/p 2 months R humeral fracture, limited mobility, still wearing sling. Per MD, sling can be removed, but pt refused, demonstrates anxiety re: moving arm in any manner   Lower Extremity Assessment Lower Extremity Assessment: Generalized weakness       Communication Communication Communication: No difficulties   Cognition Arousal/Alertness: Awake/alert Behavior During Therapy: Agitated, Anxious Overall Cognitive Status: No family/caregiver present to determine baseline cognitive functioning                                 General Comments: Pt demonstrates some confusion, has difficult time describing her PLOF, living situation     General Comments       Exercises Other Exercises Other Exercises: Educ re: MD updated orders re: no sling, no WB restrictions required for R UE. Role of OT, d/c recs.   Shoulder Instructions      Home Living Family/patient expects to be discharged to:: Private residence Living Arrangements: Alone Available Help at Discharge: Friend(s);Personal care attendant Type of Home: House Home Access: Stairs to enter [employer] of Steps: 5 Entrance Stairs-Rails: Right;Left;Can  reach both Home Layout: One level     Bathroom Shower/Tub: Occupational psychologist: Handicapped height     Home Equipment: Conservation officer, nature (2 wheels);Cane - single point;Rollator (4 wheels);Grab bars - tub/shower;Adaptive equipment;BSC/3in1;Shower seat;Other (comment) Adaptive Equipment: Reacher;Sock aid        Prior Functioning/Environment Prior Level of Function : Needs assist             Mobility Comments: Relies on rollator for mobility ADLs Comments: Pt reports had been Mod I in ADL prior to humeral fx in May 2023. At present has home care aide 7 days/wk, 4 hrs./day to assist with IADL and some ADL        OT Problem List: Impaired UE functional use;Decreased activity tolerance;Decreased range of motion;Decreased strength;Impaired balance (sitting and/or standing);Decreased cognition;Pain      OT Treatment/Interventions: Self-care/ADL training;Therapeutic exercise;Patient/family education;Balance training;Energy conservation;Therapeutic activities;DME and/or AE instruction    OT Goals(Current goals can be found in the care plan section) Acute Rehab OT Goals Patient Stated Goal: to feel better OT Goal Formulation: With patient Time For Goal Achievement: 04/18/22 Potential to Achieve Goals: Good ADL Goals Pt Will Perform Grooming:  with modified independence;standing Pt Will Perform Upper Body Dressing: sitting;with modified independence Pt Will Transfer to Toilet: with supervision;stand pivot transfer (using LRAD)  OT Frequency: Min 2X/week    Co-evaluation              AM-PAC OT "6 Clicks" Daily Activity     Outcome Measure Help from another person eating meals?: None Help from another person taking care of personal grooming?: A Little Help from another person toileting, which includes using toliet, bedpan, or urinal?: A Lot Help from another person bathing (including washing, rinsing, drying)?: A Lot Help from another person to put on and taking  off regular upper body clothing?: A Lot Help from another person to put on and taking off regular lower body clothing?: A Lot 6 Click Score: 15   End of Session    Activity Tolerance: Treatment limited secondary to agitation Patient left: in bed;with call bell/phone within reach;with bed alarm set  OT Visit Diagnosis: Unsteadiness on feet (R26.81);Muscle weakness (generalized) (M62.81)                Time: 1836-7255 OT Time Calculation (min): 33 min Charges:  OT General Charges $OT Visit: 1 Visit OT Evaluation $OT Eval Moderate Complexity: 1 Mod OT Treatments $Self Care/Home Management : 8-22 mins Josiah Lobo, PhD, MS, OTR/L 04/04/22, 3:47 PM

## 2022-04-04 NOTE — TOC Initial Note (Addendum)
Transition of Care [facility]) - Initial/Assessment Note    Patient Details  Name: April [name] MRN: [id number] Date of Birth: [birth year]  Transition of Care [facility]) CM/SW Contact:    Pete Pelt, RN Phone Number: 04/04/2022, 2:06 PM  Clinical Narrative:   Patient lives at home, states she has some assistance from relatives and [facility] home health.  She states she thinks she is too weak to go home, and is amenable to SNF. Bed search started for [facility] area.            Addendum 1558 Patient has a bed offer from [facility] care and [facility].  Patient asks to wait until tomorrow to decide, and she refuses to let [facility] contact family/emergency contact to discuss at this time.  Medicare facility review provided to patient for review.  Expected Discharge Plan: [place] ([facility]) Barriers to Discharge: Continued Medical Work up   Patient Goals and CMS Choice        Expected Discharge Plan and Services Expected Discharge Plan: [place] ([facility])   Discharge Planning Services: CM Consult   Living arrangements for the past 2 months: [place] Agency: [facility]        Prior Living Arrangements/Services Living arrangements for the past 2 months: Single Family Home Lives with:: Self Patient language and need for interpreter reviewed:: Yes (no interpreter required) Do you feel safe going back to the place where you live?: Yes      Need for Family Participation in Patient Care: Yes (Comment) Care giver support system in place?: Yes (comment) Current home services: Home RN, DME (walker, bedside commode) Criminal Activity/Legal Involvement Pertinent to Current Situation/Hospitalization: No - Comment as needed  Activities of Daily Living Home Assistive Devices/Equipment: Cane (specify quad or straight), Walker (specify type), Shower chair with back, Grab bars  around toilet, Grab bars in shower ADL Screening (condition at time of admission) Patient's cognitive ability adequate to safely complete daily activities?: Yes Is the patient deaf or have difficulty hearing?: No Does the patient have difficulty seeing, even when wearing glasses/contacts?: Yes Does the patient have difficulty concentrating, remembering, or making decisions?: No Patient able to express need for assistance with ADLs?: Yes Does the patient have difficulty dressing or bathing?: Yes Independently performs ADLs?: No Communication: Independent Dressing (OT): Needs assistance Is this a change from baseline?: Pre-admission baseline Grooming: Needs assistance Is this a change from baseline?: Pre-admission baseline Feeding: Independent Bathing: Needs assistance Is this a change from baseline?: Pre-admission baseline Toileting: Needs assistance Is this a change from baseline?: Pre-admission baseline In/Out Bed: Needs assistance Is this a change from baseline?: Pre-admission baseline Walks in Home: Needs assistance Is this a change from baseline?: Change from baseline, expected to last <3 days Does the patient have difficulty walking or climbing stairs?: Yes Weakness of Legs: Both Weakness of Arms/Hands: Both  Permission Sought/Granted Permission sought to share information with : Case Manager Permission granted to share information with : Yes, Verbal Permission Granted     Permission granted to share info w AGENCY: [facility]        Emotional Assessment Appearance:: Appears stated age Attitude/Demeanor/Rapport: Gracious Affect (typically observed): Pleasant Orientation: : Oriented to Self, Oriented to Place, Oriented to  Time, Oriented  to Situation Alcohol / Substance Use: Not Applicable Psych Involvement: No (comment)  Admission diagnosis:  Hypoxia [R09.02] Fall, initial encounter [W19.XXXA] Patient Active Problem List   Diagnosis Date Noted   Fall at  home, initial encounter 04/03/2022   Frequent falls 04/03/2022   Mass of lower lobe of left lung on CT chest 01/30/22    Closed displaced fracture of surgical neck of humerus, unspecified fracture morphology, initial encounter    Acute cystitis without hematuria    Abnormal CT of the chest    Hypoxia 01/28/2022   Lymphedema 07/02/2019   S/P right hip fracture 02/05/2019   PAD (peripheral artery disease) ([place]) 10/03/2017   Essential hypertension 10/03/2017   Pain in limb 03/21/2017   Swelling of limb 10/18/2016   Ulcer of calf ([place]) 10/18/2016   Gangrene of foot ([place]) 03/03/2016   UPJ (ureteropelvic junction) obstruction    Paroxysmal atrial fibrillation (HCC) 12/04/2015   Back pain 12/04/2015   Thrombocytopenia ([place]) 12/03/2015   Leukocytosis 12/03/2015   Type 2 diabetes mellitus ([place]) 12/03/2015   Respiratory failure ([place])    Proteus septicemia ([place])    Acute renal failure (ARF) (HCC)    Hydronephrosis    Endotracheally intubated    Respiratory distress    Arterial hypotension    Sepsis ([place]) 10/20/2015   Acute renal failure (HCC)    Altered mental status    NSTEMI (non-ST elevated myocardial infarction) ([place])    Sepsis due to urinary tract infection ([place])    PCP:  Albina Billet, MD Pharmacy:   [id number] [place], [address] [place] [address] Phone: [phone] Fax: [phone]  [place], [place] [address] Phone: [phone] Fax: [phone]     Social Determinants of Health (SDOH) Interventions    Readmission Risk Interventions    04/04/2022    1:53 PM  Readmission Risk Prevention Plan  Post Dischage Appt Complete  Medication Screening Complete  Transportation Screening Complete

## 2022-04-04 NOTE — Progress Notes (Signed)
Initial Nutrition Assessment  DOCUMENTATION CODES:   Obesity unspecified  INTERVENTION:   Ensure Max protein supplement TID, each supplement provides 150kcal and 30g of protein.  MVI po daily   Check vitamin D and B12 labs  Pt at high refeed risk; recommend monitor potassium, magnesium and phosphorus labs daily until stable  Liberalize diet   NUTRITION DIAGNOSIS:   Increased nutrient needs related to catabolic illness (lung mass) as evidenced by estimated needs.  GOAL:   Patient will meet greater than or equal to 90% of their needs  MONITOR:   PO intake, Supplement acceptance, Labs, Weight trends, Skin, I & O's  REASON FOR ASSESSMENT:   Malnutrition Screening Tool    ASSESSMENT:   76 y/o female with h/o HTN, DM, NSTEMI, PAF, recent humerus fracture and lung mass who is admitted with frequent falls.  Met with pt in room today. Pt reports good appetite and oral intake pta and in hospital. Pt reports " I am eating too much". Friend at bedside reports that pt has not been eating well at home or in hospital. Pt reports eating well at lunch but meal intake is not documented in chart and RN is unsure. Per chart, pt is down 39lbs(15%) since 2021; RD unsure how recently weight loss occurred. RD discussed with pt the importance of adequate nutrition needed to preserve lean muscle. RD will add supplements and MVI to help pt meet her estimated needs; pt prefers chocolate. RD will check vitamin D and B12 labs as pt with frequent falls. Pt is at high refeed risk.   Medications reviewed and include: jardiance, lovenox, insulin, metformin  Labs reviewed: Na 134(L) Wbc- 21.4(H) Cbgs- 141, 157 x 24 hrs AIC 6.4(H)- 5/5  NUTRITION - FOCUSED PHYSICAL EXAM:  Flowsheet Row Most Recent Value  Orbital Region No depletion  Upper Arm Region Mild depletion  Thoracic and Lumbar Region No depletion  Buccal Region No depletion  Temple Region Mild depletion  Clavicle Bone Region No depletion   Clavicle and Acromion Bone Region No depletion  Scapular Bone Region No depletion  Dorsal Hand Mild depletion  Patellar Region Mild depletion  Anterior Thigh Region Mild depletion  Posterior Calf Region Mild depletion  Edema (RD Assessment) None  Hair Reviewed  Eyes Reviewed  Mouth Reviewed  Skin Reviewed  Nails Reviewed   Diet Order:   Diet Order             Diet heart healthy/carb modified Room service appropriate? Yes; Fluid consistency: Thin  Diet effective now                  EDUCATION NEEDS:   Education needs have been addressed  Skin:  Skin Assessment: Reviewed RN Assessment  Last BM:  7/8  Height:   Ht Readings from Last 1 Encounters:  04/02/22 _0  (1.676 m)    Weight:   Wt Readings from Last 1 Encounters:  04/02/22 97.2 kg    Ideal Body Weight:  59 kg  BMI:  Body mass index is 34.59 kg/m.  Estimated Nutritional Needs:   Kcal:  1900-2200kcal/day  Protein:  95-110g/day  Fluid:  1.5-1.8L/day  Koleen Distance MS, RD, LDN Please refer to [facility] for RD and/or RD on-call/weekend/after hours pager

## 2022-04-04 NOTE — Progress Notes (Signed)
PROGRESS NOTE    April [name]  LKH:[id number] DOB: 02-23-1946 DOA: 04/02/2022 PCP: Albina Billet, MD    Brief Narrative:  April [name] is a 76 y.o. female who lives independently with medical history significant for DM 2, HTN, PVD, chronic leukocytosis of unclear etiology, hospitalized in May 2023 following a fall resulting in a fractured humerus, with incidental finding of a left lung mass suspicious for malignancy for which patient desired no further work-up with PET scan as recommended by oncology, who presents to the ED following a mechanical fall onto her right knee as she was trying to get her clothes off.  She did not hit her head nor did she have loss of consciousness.  She denied preceding lightheadedness, palpitations or shortness of breath, headache, visual disturbance or one-sided weakness numbness or tingling.  Following the fall she only complained of right hip and knee pain and right shoulder pain. ED course and data review: Afebrile, pulse 106-113 respirations 22-31 with O2 sat 88% on room air improving to 94% on 2 L, BP 139/74 Labs: WBC 21,000 up from baseline of 15-18,000 with hemoglobin of 15.  CMP significant for AST of 56 and alk phos of 465 otherwise unremarkable, troponin 11 and BNP 66.  VBG with pH 7.47 and PCO2 38.   CTA  1. No evidence of pulmonary embolus. 2. Left lower lobe pulmonary mass and bilateral solid pulmonary nodules, concerning for metastatic disease. Consider CT of the abdomen and pelvis to assess for primary malignancy. 3. Age indeterminate moderate compression deformity of T6. Correlate for point tenderness. 4. Aortic Atherosclerosis (ICD10-I70.0  7/10 PT recommends SNF  Consultants:    Procedures:   Antimicrobials:      Subjective: Sleepy this morning and asking not to be bothered so she could sleep right now.  Objective: Vitals:   04/03/22 1626 04/03/22 1955 04/04/22 0617 04/04/22 0720  BP: (!) 154/76 (!) 153/73 (!) 154/76 138/76   Pulse: (!) 110 (!) 108 (!) 108 (!) 108  Resp: '17 18 20 17  ' Temp: 99.3 F (37.4 C) 98.1 F (36.7 C) 98.4 F (36.9 C) 98.7 F (37.1 C)  TempSrc: Oral     SpO2: 94% 94% 93% 94%  Weight:      Height:        Intake/Output Summary (Last 24 hours) at 04/04/2022 1402 Last data filed at 04/04/2022 0131 Gross per 24 hour  Intake --  Output 600 ml  Net -600 ml   Filed Weights   04/02/22 2000  Weight: 97.2 kg    Examination: Calm, NAD, falls asleep during exam, light snoring Cta no w/r Reg s1/s2 no gallop Soft benign +bs No edema Grossly intact Mood and affect appropriate in current setting     Data Reviewed: I have personally reviewed following labs and imaging studies  CBC: Recent Labs  Lab 04/02/22 2007  WBC 21.4*  NEUTROABS 17.1*  HGB 15.6*  HCT 48.6*  MCV 84.7  PLT 096   Basic Metabolic Panel: Recent Labs  Lab 04/02/22 2007  NA 134*  K 3.6  CL 97*  CO2 26  GLUCOSE 128*  BUN 12  CREATININE 0.51  CALCIUM 9.0   GFR: Estimated Creatinine Clearance: 71.5 mL/min (by C-G formula based on SCr of 0.51 mg/dL). Liver Function Tests: Recent Labs  Lab 04/02/22 2007  AST 56*  ALT 14  ALKPHOS 465*  BILITOT 1.0  PROT 7.1  ALBUMIN 2.6*   No results for input(s): "LIPASE", "AMYLASE" in the last  168 hours. No results for input(s): "AMMONIA" in the last 168 hours. Coagulation Profile: No results for input(s): "INR", "PROTIME" in the last 168 hours. Cardiac Enzymes: No results for input(s): "CKTOTAL", "CKMB", "CKMBINDEX", "TROPONINI" in the last 168 hours. BNP (last 3 results) No results for input(s): "PROBNP" in the last 8760 hours. HbA1C: No results for input(s): "HGBA1C" in the last 72 hours. CBG: Recent Labs  Lab 04/03/22 1150 04/03/22 1624 04/03/22 2004 04/04/22 0730 04/04/22 1138  GLUCAP 152* 164* 148* 157* 141*   Lipid Profile: No results for input(s): "CHOL", "HDL", "LDLCALC", "TRIG", "CHOLHDL", "LDLDIRECT" in the last 72 hours. Thyroid  Function Tests: No results for input(s): "TSH", "T4TOTAL", "FREET4", "T3FREE", "THYROIDAB" in the last 72 hours. Anemia Panel: No results for input(s): "VITAMINB12", "FOLATE", "FERRITIN", "TIBC", "IRON", "RETICCTPCT" in the last 72 hours. Sepsis Labs: No results for input(s): "PROCALCITON", "LATICACIDVEN" in the last 168 hours.  No results found for this or any previous visit (from the past 240 hour(s)).       Radiology Studies: DG Chest Portable 1 View  Result Date: 04/02/2022 CLINICAL DATA:  Recent fall with right-sided pain, initial encounter EXAM: PORTABLE CHEST 1 VIEW COMPARISON:  01/28/2022 FINDINGS: Cardiac shadow is within normal limits. Aortic calcifications are again seen. Increased vascular congestion and mild edema is noted. No focal confluent infiltrate is seen. Old rib fractures are noted bilaterally with healing. IMPRESSION: No acute abnormality noted. Electronically Signed   By: Inez Catalina M.D.   On: 04/02/2022 21:50   DG Knee 2 Views Right  Result Date: 04/02/2022 CLINICAL DATA:  Right leg pain following fall, initial encounter EXAM: RIGHT KNEE - 2 VIEW COMPARISON:  None Available. FINDINGS: Postsurgical changes in the distal femur are noted. No acute fracture is noted. IMPRESSION: No acute abnormality noted. Electronically Signed   By: Inez Catalina M.D.   On: 04/02/2022 21:49   DG Hip Unilat W or Wo Pelvis 2-3 Views Right  Result Date: 04/02/2022 CLINICAL DATA:  Right-sided hip pain following fall several hours ago, initial encounter EXAM: DG HIP (WITH OR WITHOUT PELVIS) 2-3V RIGHT COMPARISON:  02/07/2019 FINDINGS: Pelvic ring is intact. Postsurgical changes in the proximal right femur are noted. Degenerative changes of the right hip joint are noted. No acute fracture or dislocation is seen. No soft tissue changes are noted. IMPRESSION: Degenerative change in the right hip joint. No acute abnormality is noted. Electronically Signed   By: Inez Catalina M.D.   On: 04/02/2022  21:48        Scheduled Meds:  diclofenac Sodium  4 g Topical QID   empagliflozin  10 mg Oral Daily   enoxaparin (LOVENOX) injection  0.5 mg/kg Subcutaneous Q24H   insulin aspart  0-15 Units Subcutaneous TID WC   insulin aspart  0-5 Units Subcutaneous QHS   lidocaine  1 patch Transdermal Q24H   [START ON 04/05/2022] metFORMIN  500 mg Oral BID WC   pravastatin  40 mg Oral Daily   Continuous Infusions:  Assessment & Plan:   Principal Problem:   Fall at home, initial encounter Active Problems:   Hypoxia   Frequent falls   Leukocytosis   Mass of lower lobe of left lung on CT chest 01/30/22   Type 2 diabetes mellitus ([place])   PAD (peripheral artery disease) ([place])   Essential hypertension   Frequent falls Right knee pain secondary to fall History of fall in May 2023 resulting in fracture of the humerus Patient lives alone PT recommends SNF Pain control  OT pending     Mass of lower lobe of left lung on CT chest 01/30/22 Hypoxia  Likely secondary to lung mass suspicious for malignancy.  O2 sats in the mid 80s off oxygen in the ED Supplemental oxygen to keep sats over 92% Patient desires no further work-up of the lung mass Was seen by oncology on 5/7 and was not interested in recommended PET scan 7/20 palliative care consulted pending   Leukocytosis Slightly more elevated than baseline.  Etiology uncertain.  No additional stigmata of acute infection Continue to monitor 7/10 ucx pending' Afebrile Will ck cbc in am   Essential hypertension Continue metoprolol   PAD (peripheral artery disease) ([facility]) Continue metoprolol and pravastatin   Type 2 diabetes mellitus ([place]) Continue home metformin and Jardiance with sliding scale coverage         DVT prophylaxis: Lovenox Code Status: Full Family Communication: None at bedside Disposition Plan: SNF Status is: Observation The patient remains OBS appropriate and will d/c before 2 midnights.        LOS: 1 day    Time spent:35    Nolberto Hanlon, MD [facility] Pager [phone]  If 7PM-7AM, please contact night-coverage 04/04/2022, 2:02 PM

## 2022-04-04 NOTE — Plan of Care (Signed)

## 2022-04-05 DIAGNOSIS — Z7189 Other specified counseling: Secondary | ICD-10-CM | POA: Diagnosis not present

## 2022-04-05 DIAGNOSIS — Z515 Encounter for palliative care: Secondary | ICD-10-CM

## 2022-04-05 DIAGNOSIS — E559 Vitamin D deficiency, unspecified: Secondary | ICD-10-CM

## 2022-04-05 DIAGNOSIS — R918 Other nonspecific abnormal finding of lung field: Secondary | ICD-10-CM | POA: Diagnosis not present

## 2022-04-05 DIAGNOSIS — Y92009 Unspecified place in unspecified non-institutional (private) residence as the place of occurrence of the external cause: Secondary | ICD-10-CM | POA: Diagnosis not present

## 2022-04-05 DIAGNOSIS — N390 Urinary tract infection, site not specified: Secondary | ICD-10-CM

## 2022-04-05 DIAGNOSIS — W19XXXA Unspecified fall, initial encounter: Secondary | ICD-10-CM | POA: Diagnosis not present

## 2022-04-05 DIAGNOSIS — I10 Essential (primary) hypertension: Secondary | ICD-10-CM | POA: Diagnosis not present

## 2022-04-05 LAB — CBC
HCT: 44.2 % (ref 36.0–46.0)
Hemoglobin: 13.9 g/dL (ref 12.0–15.0)
MCH: 27.1 pg (ref 26.0–34.0)
MCHC: 31.4 g/dL (ref 30.0–36.0)
MCV: 86.2 fL (ref 80.0–100.0)
Platelets: 256 10*3/uL (ref 150–400)
RBC: 5.13 MIL/uL — ABNORMAL HIGH (ref 3.87–5.11)
RDW: 15.6 % — ABNORMAL HIGH (ref 11.5–15.5)
WBC: 19.6 10*3/uL — ABNORMAL HIGH (ref 4.0–10.5)
nRBC: 0 % (ref 0.0–0.2)

## 2022-04-05 LAB — GLUCOSE, CAPILLARY
Glucose-Capillary: 152 mg/dL — ABNORMAL HIGH (ref 70–99)
Glucose-Capillary: 147 mg/dL — ABNORMAL HIGH (ref 70–99)
Glucose-Capillary: 128 mg/dL — ABNORMAL HIGH (ref 70–99)
Glucose-Capillary: 111 mg/dL — ABNORMAL HIGH (ref 70–99)

## 2022-04-05 LAB — VITAMIN B12: Vitamin B-12: 203 pg/mL (ref 180–914)

## 2022-04-05 LAB — VITAMIN D 25 HYDROXY (VIT D DEFICIENCY, FRACTURES): Vit D, 25-Hydroxy: 16.03 ng/mL — ABNORMAL LOW (ref 30–100)

## 2022-04-05 LAB — MRSA NEXT GEN BY PCR, NASAL: MRSA by PCR Next Gen: NOT DETECTED

## 2022-04-05 LAB — PHOSPHORUS: Phosphorus: 3.9 mg/dL (ref 2.5–4.6)

## 2022-04-05 MED ORDER — SODIUM CHLORIDE 0.9 % IV SOLN
1.0000 g | Freq: Every day | INTRAVENOUS | Status: DC
  Administered 2022-04-05 – 2022-04-06 (×2): 1 g via INTRAVENOUS
  Filled 2022-04-05 (×2): qty 1

## 2022-04-05 MED ORDER — VITAMIN B-12 1000 MCG PO TABS
1000.0000 ug | ORAL_TABLET | Freq: Every day | ORAL | Status: DC
  Administered 2022-04-06 – 2022-04-10 (×5): 1000 ug via ORAL
  Filled 2022-04-05 (×5): qty 1

## 2022-04-05 MED ORDER — VITAMIN D (ERGOCALCIFEROL) 1.25 MG (50000 UNIT) PO CAPS
50000.0000 [IU] | ORAL_CAPSULE | ORAL | Status: DC
  Administered 2022-04-06: 50000 [IU] via ORAL
  Filled 2022-04-05: qty 1

## 2022-04-05 NOTE — Consult Note (Signed)
Consultation Note Date: 04/05/2022   Patient Name: April [name]  DOB: [birth year]  MRN: [id number]  Age / Sex: 76 y.o., female  PCP: Albina Billet, MD Referring Physician: Nolberto Hanlon, MD  Reason for Consultation: Establishing goals of care  HPI/Patient Profile: 76 y.o. 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   Clinical Assessment and Goals of Care: I have reviewed records including previous hospitalization, diagnostics including labs/scans, and oncology consultation. I met with Ms. Michiels who is diaphoretic and hot. I assisted with cool washcloth for face and turning air cooler in room. I introduced myself from palliative care and my role. She shares with me experiences she has witnessed with her grandfather and friends and what they have been through in their health. She has understanding about our concern for lung nodule and suspicion of cancer. Although she talks of this nodule hopefully being something other than cancer she acknowledges that this is a realistic possibility. She stands by her decision not to pursue further work up as she has no desire to pursue treatment with radiation or systemic therapy. She does not feel that she is strong enough to sustain these treatments. She stands by her decision with knowledge that if this is cancer and continues to progress the option for treatment may not be available in the future. She tells me that if she dies from this then she has made her peace with that decision. I reassure  her that we will respect her wishes.   I attempted to discuss more with Ms. Scruggs the importance of advance care planning and making other decisions in her care to assist [place] to care for her. She clearly values quality of life and symptom management although she is hopeful she can improve her appetite and gain some strength in rehab stay. She would be open to facility care/ALF/etc as recommended. I discussed with her the importance of HCPOA and she is unable to name anyone she feels would make a good decision for her. I explained the importance of clarifying her wishes sooner than later in the absence of a clear surrogate decision maker as we discussed code status. She is unable to make a decision regarding code status at this time. I recommended consideration of DNR status given her stated wishes and desire not to want invasive work up or treatment of lung mass. Ms. Pall understands but would like more time to consider her wishes. She does report that she required life support in 2017 with sepsis but does recognize that her health is in a much different place and if this were to happen again her ability to survive an acute event like this is very low and even if she were to survive  I would worry about the quality of life this would lead too.   Ms. Aja agrees with outpatient palliative to follow along with her and continue conversation. Would be helpful to ultimately have MOST form but she is struggling with decisions at this stage and needs more conversation. All questions/concerns addressed. Emotional support provided.   Primary Decision Maker PATIENT    SUMMARY OF RECOMMENDATIONS   - Needs ongoing [place] conversation - Struggling with some decisions although clear about no desire for work up/treatment of lung nodule  Code Status/Advance Care Planning: Full code - considering options   Symptom Management:  Per attending.   Prognosis:  Overall prognosis poor with probably underlying  cancer with multiple comorbidities and declining functional status.   Discharge Planning: [place] for rehab with Palliative care service follow-up      Primary Diagnoses: Present on Admission:  PAD (peripheral artery disease) ([place])  Leukocytosis  Essential hypertension  Hypoxia   I have reviewed the medical record, interviewed the patient and family, and examined the patient. The following aspects are pertinent.  Past Medical History:  Diagnosis Date   Acute renal failure (ARF) ([place])    Difficult intubation    Hypertension    NSTEMI (non-ST elevated myocardial infarction) (HCC)    Paroxysmal atrial fibrillation (HCC)    Renal disorder    Sepsis ([place]) January 2017   [facility]   Type 2 diabetes mellitus ([place])    Social History   Socioeconomic History   Marital status: Single    Spouse name: Not on file   Number of children: Not on file   Years of education: Not on file   Highest education level: Not on file  Occupational History   Not on file  Tobacco Use   Smoking status: Never   Smokeless tobacco: Never  Substance and Sexual Activity   Alcohol use: No    Alcohol/week: 0.0 standard drinks of alcohol   Drug use: No   Sexual activity: Not on file  Other Topics Concern   Not on file  Social History Narrative   Not on file   Social Determinants of Health   Financial Resource Strain: Not on file  Food Insecurity: Not on file  Transportation Needs: Not on file  Physical Activity: Not on file  Stress: Not on file  Social Connections: Not on file   Family History  Problem Relation Age of Onset   Diabetes Father    Heart disease Father    Stroke Maternal Grandmother    Scheduled Meds:  diclofenac Sodium  4 g Topical QID   empagliflozin  10 mg Oral Daily   enoxaparin (LOVENOX) injection  0.5 mg/kg Subcutaneous Q24H   insulin aspart  0-15 Units Subcutaneous TID WC   insulin aspart  0-5 Units Subcutaneous QHS   lidocaine  1 patch Transdermal Q24H    metFORMIN  500 mg Oral BID WC   multivitamin with minerals  1 tablet Oral Daily   pravastatin  40 mg Oral Daily   Ensure Max Protein  11 oz Oral TID   Continuous Infusions: PRN Meds:.acetaminophen **OR** acetaminophen, ondansetron **OR** ondansetron (ZOFRAN) IV, oxyCODONE Allergies  Allergen Reactions   Prednisone Anaphylaxis   Other    Review of Systems  Constitutional:  Positive for activity change, appetite change and fatigue.  Neurological:  Positive for weakness.    Physical Exam Vitals and nursing note reviewed.  Constitutional:      General: She is not in acute distress.  Appearance: She is ill-appearing.  Cardiovascular:     Rate and Rhythm: Tachycardia present.  Pulmonary:     Effort: No tachypnea, accessory muscle usage or respiratory distress.  Abdominal:     Palpations: Abdomen is soft.  Musculoskeletal:     Right lower leg: Edema present.     Left lower leg: Edema present.  Neurological:     Mental Status: She is alert and oriented to person, place, and time.     Vital Signs: BP 137/68 (BP Location: Left Arm)   Pulse (!) 103   Temp 97.7 F (36.5 C) (Oral)   Resp 16   Ht _0  (1.676 m)   Wt 97.2 kg   SpO2 97%   BMI 34.59 kg/m  Pain Scale: 0-10 POSS *See Group Information*: 1-Acceptable,Awake and alert Pain Score: Asleep   SpO2: SpO2: 97 % O2 Device:SpO2: 97 % O2 Flow Rate: .O2 Flow Rate (L/min): 1 L/min  IO: Intake/output summary:  Intake/Output Summary (Last 24 hours) at 04/05/2022 0901 Last data filed at 04/05/2022 0755 Gross per 24 hour  Intake --  Output 550 ml  Net -550 ml    LBM: Last BM Date : 04/02/22 Baseline Weight: Weight: 97.2 kg Most recent weight: Weight: 97.2 kg     Palliative Assessment/Data:     Time In: 1000  Time Total: 80 min  Greater than 50%  of this time was spent counseling and coordinating care related to the above assessment and plan.  Signed by: Vinie Sill, NP Palliative Medicine Team Pager  # [phone] (M-F 8a-5p) Team Phone # [phone] (Nights/Weekends)

## 2022-04-05 NOTE — TOC Progression Note (Signed)
Transition of Care [facility]) - Progression Note    Patient Details  Name: April [name] MRN: [id number] Date of Birth: December 08, 1945  [facility] CM/SW [place], RN Phone Number: 04/05/2022, 2:41 PM  Clinical Narrative:   Patient was confused in regards to SNF choice and current situation.  RNCM contacted [employer] (number on chart), this number corresponds to a different person, not [name].  RNCM left message for Jewel (second contact on chart) to assist with SNF.  Awaiting return call    Expected Discharge Plan: [place] ([facility]) Barriers to Discharge: Continued Medical Work up  Expected Discharge Plan and Services Expected Discharge Plan: [place] ([facility])   Discharge Planning Services: CM Consult   Living arrangements for the past 2 months: [place]: Well [place] Determinants of Health (SDOH) Interventions    Readmission Risk Interventions    04/04/2022    1:53 PM  Readmission Risk Prevention Plan  Post Dischage Appt Complete  Medication Screening Complete  Transportation Screening Complete

## 2022-04-05 NOTE — Progress Notes (Addendum)
[facility] PROGRESS NOTE    BRIGITTA PRICER  WRU:[id number] DOB: 08/08/1946 DOA: 04/02/2022 PCP: Albina Billet, MD    Brief Narrative:  April [name] is a 76 y.o. female who lives independently with medical history significant for DM 2, HTN, PVD, chronic leukocytosis of unclear etiology, hospitalized in May 2023 following a fall resulting in a fractured humerus, with incidental finding of a left lung mass suspicious for malignancy for which patient desired no further work-up with PET scan as recommended by oncology, who presents to the ED following a mechanical fall onto her right knee as she was trying to get her clothes off.  She did not hit her head nor did she have loss of consciousness.  She denied preceding lightheadedness, palpitations or shortness of breath, headache, visual disturbance or one-sided weakness numbness or tingling.  Following the fall she only complained of right hip and knee pain and right shoulder pain. ED course and data review: Afebrile, pulse 106-113 respirations 22-31 with O2 sat 88% on room air improving to 94% on 2 L, BP 139/74 Labs: WBC 21,000 up from baseline of 15-18,000 with hemoglobin of 15.  CMP significant for AST of 56 and alk phos of 465 otherwise unremarkable, troponin 11 and BNP 66.  VBG with pH 7.47 and PCO2 38.   CTA  1. No evidence of pulmonary embolus. 2. Left lower lobe pulmonary mass and bilateral solid pulmonary nodules, concerning for metastatic disease. Consider CT of the abdomen and pelvis to assess for primary malignancy. 3. Age indeterminate moderate compression deformity of T6. Correlate for point tenderness. 4. Aortic Atherosclerosis (ICD10-I70.0  7/11 PT rec. SNF  Consultants:    Procedures:   Antimicrobials:      Subjective: No worsening sob , no cp. Talking to palliative this am.   Objective: Vitals:   04/04/22 1819 04/04/22 2025 04/05/22 0639 04/05/22 1321  BP:  126/62 137/68 (!) 109/47  Pulse: (!) 108 (!) 109 (!) 103 99  Resp:   16 16   Temp:  98.3 F (36.8 C) 97.7 F (36.5 C) 98.8 F (37.1 C)  TempSrc:   Oral Oral  SpO2:  94% 97% 92%  Weight:      Height:        Intake/Output Summary (Last 24 hours) at 04/05/2022 1537 Last data filed at 04/05/2022 0755 Gross per 24 hour  Intake --  Output 550 ml  Net -550 ml   Filed Weights   04/02/22 2000  Weight: 97.2 kg    Examination: Calm, NAD Decrease bs, no wheezing Reg s1/s2 no gallop Soft benign +bs No edema Aaoxox3  Mood and affect appropriate in current setting     Data Reviewed: I have personally reviewed following labs and imaging studies  CBC: Recent Labs  Lab 04/02/22 2007 04/05/22 0439  WBC 21.4* 19.6*  NEUTROABS 17.1*  --   HGB 15.6* 13.9  HCT 48.6* 44.2  MCV 84.7 86.2  PLT 337 914   Basic Metabolic Panel: Recent Labs  Lab 04/02/22 2007 04/05/22 0439  NA 134*  --   K 3.6  --   CL 97*  --   CO2 26  --   GLUCOSE 128*  --   BUN 12  --   CREATININE 0.51  --   CALCIUM 9.0  --   PHOS  --  3.9   GFR: Estimated Creatinine Clearance: 71.5 mL/min (by C-G formula based on SCr of 0.51 mg/dL). Liver Function Tests: Recent Labs  Lab 04/02/22 2007  AST 56*  ALT 14  ALKPHOS 465*  BILITOT 1.0  PROT 7.1  ALBUMIN 2.6*   No results for input(s): "LIPASE", "AMYLASE" in the last 168 hours. No results for input(s): "AMMONIA" in the last 168 hours. Coagulation Profile: No results for input(s): "INR", "PROTIME" in the last 168 hours. Cardiac Enzymes: No results for input(s): "CKTOTAL", "CKMB", "CKMBINDEX", "TROPONINI" in the last 168 hours. BNP (last 3 results) No results for input(s): "PROBNP" in the last 8760 hours. HbA1C: No results for input(s): "HGBA1C" in the last 72 hours. CBG: Recent Labs  Lab 04/04/22 1138 04/04/22 1807 04/04/22 2033 04/05/22 0803 04/05/22 1329  GLUCAP 141* 178* 177* 152* 147*   Lipid Profile: No results for input(s): "CHOL", "HDL", "LDLCALC", "TRIG", "CHOLHDL", "LDLDIRECT" in the last 72  hours. Thyroid Function Tests: No results for input(s): "TSH", "T4TOTAL", "FREET4", "T3FREE", "THYROIDAB" in the last 72 hours. Anemia Panel: Recent Labs    04/05/22 0439  VITAMINB12 203   Sepsis Labs: No results for input(s): "PROCALCITON", "LATICACIDVEN" in the last 168 hours.  Recent Results (from the past 240 hour(s))  Urine Culture     Status: Abnormal (Preliminary result)   Collection Time: 04/02/22  8:07 PM   Specimen: Urine, Random  Result Value Ref Range Status   Specimen Description   Final    URINE, RANDOM Performed at [facility], [address] [place], [place] [address]    Special Requests   Final    NONE Performed at [facility], [address], [place], [place] [address]    Culture (A)  Final    >=100,000 COLONIES/mL KLEBSIELLA PNEUMONIAE SUSCEPTIBILITIES TO FOLLOW Performed at [facility], [place] [address], [place], [place] [address]    Report Status PENDING  Incomplete  MRSA Next Gen by PCR, Nasal     Status: None   Collection Time: 04/05/22 12:30 PM   Specimen: Nasal Mucosa; Nasal Swab  Result Value Ref Range Status   MRSA by PCR Next Gen NOT DETECTED NOT DETECTED Final    Comment: (NOTE) The GeneXpert MRSA Assay (FDA approved for NASAL specimens only), is one component of a comprehensive MRSA colonization surveillance program. It is not intended to diagnose MRSA infection nor to guide or monitor treatment for MRSA infections. Test performance is not FDA approved in patients less than 12 years old. Performed at [facility], [address] [place],  [address]          Radiology Studies: No results found.      Scheduled Meds:  diclofenac Sodium  4 g Topical QID   empagliflozin  10 mg Oral Daily   enoxaparin (LOVENOX) injection  0.5 mg/kg Subcutaneous Q24H   insulin aspart  0-15 Units Subcutaneous TID WC   insulin aspart  0-5 Units Subcutaneous QHS   lidocaine  1 patch Transdermal Q24H    metFORMIN  500 mg Oral BID WC   multivitamin with minerals  1 tablet Oral Daily   pravastatin  40 mg Oral Daily   Ensure Max Protein  11 oz Oral TID   Vitamin D (Ergocalciferol)  50,000 Units Oral Q7 days   Continuous Infusions:  cefTRIAXone (ROCEPHIN)  IV      Assessment & Plan:   Principal Problem:   Fall at home, initial encounter Active Problems:   Hypoxia   Frequent falls   Leukocytosis   Mass of lower lobe of left lung on CT chest 01/30/22   Type 2 diabetes mellitus ([place])  PAD (peripheral artery disease) (H[facility])   Essential hypertension   Vitamin D deficiency   UTI (urinary tract infection)   Frequent falls Right knee pain secondary to fall History of fall in May 2023 resulting in fracture of the humerus Patient lives alone Pain control PT rec. SNF   Mass of lower lobe of left lung on CT chest 01/30/22 Hypoxia  Likely secondary to lung mass suspicious for malignancy.  O2 sats in the mid 80s off oxygen in the ED Supplemental oxygen to keep sats over 92% Patient desires no further work-up of the lung mass Was seen by oncology on 5/7 and was not interested in recommended PET scan 7/11 palliative following. She is full code. She has poor px overall    Leukocytosis likley from UTI UTI Ucx >100,00 Klebsiella pneumonia Start on iv rocephin Sensitivity pending  Vitamin D deficiency Level 16 We will replace  Essential hypertension Stable. At times on low side   PAD (peripheral artery disease) (HCC) Continue  pravastatin   Type 2 diabetes mellitus ([place]) Continue home metformin and Jardiance with sliding scale coverage         DVT prophylaxis: Lovenox Code Status: Full Family Communication: None at bedside Disposition Plan: SNF Status is: Observation The patient remains OBS appropriate and will d/c before 2 midnights. SNF pending. Iv treatment.        LOS: 1 day   Time spent:50 min    Nolberto Hanlon, MD [facility] Pager [phone]  If  7PM-7AM, please contact night-coverage 04/05/2022, 3:37 PM

## 2022-04-06 DIAGNOSIS — Y92009 Unspecified place in unspecified non-institutional (private) residence as the place of occurrence of the external cause: Secondary | ICD-10-CM | POA: Diagnosis not present

## 2022-04-06 DIAGNOSIS — W19XXXA Unspecified fall, initial encounter: Secondary | ICD-10-CM | POA: Diagnosis not present

## 2022-04-06 DIAGNOSIS — I10 Essential (primary) hypertension: Secondary | ICD-10-CM | POA: Diagnosis not present

## 2022-04-06 LAB — CBC WITH DIFFERENTIAL/PLATELET
Abs Immature Granulocytes: 0.43 10*3/uL — ABNORMAL HIGH (ref 0.00–0.07)
Basophils Absolute: 0.1 10*3/uL (ref 0.0–0.1)
Basophils Relative: 1 %
Eosinophils Absolute: 0.5 10*3/uL (ref 0.0–0.5)
Eosinophils Relative: 3 %
HCT: 44.7 % (ref 36.0–46.0)
Hemoglobin: 14.3 g/dL (ref 12.0–15.0)
Immature Granulocytes: 2 %
Lymphocytes Relative: 4 %
Lymphs Abs: 0.8 10*3/uL (ref 0.7–4.0)
MCH: 27.2 pg (ref 26.0–34.0)
MCHC: 32 g/dL (ref 30.0–36.0)
MCV: 85.1 fL (ref 80.0–100.0)
Monocytes Absolute: 1.2 10*3/uL — ABNORMAL HIGH (ref 0.1–1.0)
Monocytes Relative: 6 %
Neutro Abs: 16.5 10*3/uL — ABNORMAL HIGH (ref 1.7–7.7)
Neutrophils Relative %: 84 %
Platelets: 287 10*3/uL (ref 150–400)
RBC: 5.25 MIL/uL — ABNORMAL HIGH (ref 3.87–5.11)
RDW: 15.8 % — ABNORMAL HIGH (ref 11.5–15.5)
WBC: 19.5 10*3/uL — ABNORMAL HIGH (ref 4.0–10.5)
nRBC: 0 % (ref 0.0–0.2)

## 2022-04-06 LAB — GLUCOSE, CAPILLARY
Glucose-Capillary: 155 mg/dL — ABNORMAL HIGH (ref 70–99)
Glucose-Capillary: 129 mg/dL — ABNORMAL HIGH (ref 70–99)
Glucose-Capillary: 142 mg/dL — ABNORMAL HIGH (ref 70–99)
Glucose-Capillary: 155 mg/dL — ABNORMAL HIGH (ref 70–99)

## 2022-04-06 LAB — URINE CULTURE: Culture: >=100000 — AB

## 2022-04-06 LAB — BASIC METABOLIC PANEL
Anion gap: 9 (ref 5–15)
BUN: 16 mg/dL (ref 8–23)
CO2: 29 mmol/L (ref 22–32)
Calcium: 9.3 mg/dL (ref 8.9–10.3)
Chloride: 98 mmol/L (ref 98–111)
Creatinine, Ser: 0.49 mg/dL (ref 0.44–1.00)
GFR, Estimated: >60 mL/min (ref >60)
Glucose, Bld: 140 mg/dL — ABNORMAL HIGH (ref 70–99)
Potassium: 3.1 mmol/L — ABNORMAL LOW (ref 3.5–5.1)
Sodium: 136 mmol/L (ref 135–145)

## 2022-04-06 MED ORDER — BISACODYL 10 MG RE SUPP
10.0000 mg | Freq: Every day | RECTAL | Status: DC | PRN
  Administered 2022-04-09: 10 mg via RECTAL
  Filled 2022-04-06: qty 1

## 2022-04-06 MED ORDER — POTASSIUM CHLORIDE CRYS ER 20 MEQ PO TBCR
40.0000 meq | EXTENDED_RELEASE_TABLET | Freq: Once | ORAL | Status: AC
  Administered 2022-04-06: 40 meq via ORAL
  Filled 2022-04-06: qty 2

## 2022-04-06 MED ORDER — SODIUM CHLORIDE 0.9 % IV SOLN
1.0000 g | Freq: Once | INTRAVENOUS | Status: AC
  Administered 2022-04-07: 1 g via INTRAVENOUS
  Filled 2022-04-06: qty 1

## 2022-04-06 MED ORDER — SENNOSIDES-DOCUSATE SODIUM 8.6-50 MG PO TABS
1.0000 | ORAL_TABLET | Freq: Two times a day (BID) | ORAL | Status: DC
  Administered 2022-04-06 – 2022-04-10 (×9): 1 via ORAL
  Filled 2022-04-06 (×9): qty 1

## 2022-04-06 MED ORDER — POLYETHYLENE GLYCOL 3350 17 G PO PACK
17.0000 g | PACK | Freq: Every day | ORAL | Status: DC
  Administered 2022-04-06 – 2022-04-10 (×5): 17 g via ORAL
  Filled 2022-04-06 (×5): qty 1

## 2022-04-06 NOTE — Progress Notes (Signed)
PROGRESS NOTE    April [name]  YDX:[id number] DOB: May 17, 1946 DOA: 04/02/2022 PCP: Albina Billet, MD    Brief Narrative:  76 y.o. female who lives independently with medical history significant for DM 2, HTN, PVD, chronic leukocytosis of unclear etiology, hospitalized in May 2023 following a fall resulting in a fractured humerus, with incidental finding of a left lung mass suspicious for malignancy for which patient desired no further work-up with PET scan as recommended by oncology, who presents to the ED following a mechanical fall onto her right knee as she was trying to get her clothes off.  She did not hit her head nor did she have loss of consciousness.  She denied preceding lightheadedness, palpitations or shortness of breath, headache, visual disturbance or one-sided weakness numbness or tingling.  Following the fall she only complained of right hip and knee pain and right shoulder pain.   Assessment & Plan:   Principal Problem:   Fall at home, initial encounter Active Problems:   Hypoxia   Frequent falls   Leukocytosis   Mass of lower lobe of left lung on CT chest 01/30/22   Type 2 diabetes mellitus ([place])   PAD (peripheral artery disease) (HCC)   Essential hypertension   Vitamin D deficiency   UTI (urinary tract infection)  Frequent falls Right knee pain secondary to fall History of fall in May 2023 resulting in fracture of the humerus Patient lives alone Patient may not be safe to return to home alone TOC has been unable to reach her contacts Plan: At this point patient is unsafe to discharge home TOC aware and looking for options      Mass of lower lobe of left lung on CT chest 01/30/22 Hypoxia  Likely secondary to lung mass suspicious for malignancy.  O2 sats in the mid 80s off oxygen in the ED Supplemental oxygen to keep sats over 92% Patient desires no further work-up of the lung mass Was seen by oncology on 5/7 and was not interested in recommended PET scan 7/11  palliative following. She is full code. She has poor px overall    Leukocytosis likley from UTI UTI Ucx >100,00 Klebsiella pneumonia Plan: IV Rocephin, 3-day course Last dose 7/13   Vitamin D deficiency Level 16 Replacement ordered   Essential hypertension Stable. At times on low side   PAD (peripheral artery disease) (HCC) Continue  pravastatin   Type 2 diabetes mellitus ([place]) Okay to continue metformin and Jardiance for now  DVT prophylaxis: Lovenox Code Status: Full Family Communication: None today Disposition Plan: Status is: Observation The patient will require care spanning > 2 midnights and should be moved to inpatient because: UTI on IV antibiotics, unsafe discharge plan   Level of care: Med-Surg  Consultants:  Palliative care  Procedures:  None  Antimicrobials: Ceftriaxone   Subjective: Seen and examined.  Sleepy but does awaken on prompting.  Flattened affect  Objective: Vitals:   04/05/22 2010 04/05/22 2026 04/06/22 0456 04/06/22 0741  BP: (!) 141/77 (!) 144/82 117/63 137/73  Pulse: (!) 111 (!) 109 (!) 109 (!) 109  Resp: 18 18 19 14   Temp: 97.9 F (36.6 C) 97.9 F (36.6 C) 98.1 F (36.7 C) 98.7 F (37.1 C)  TempSrc:      SpO2: 93% 96% 94% 96%  Weight:      Height:        Intake/Output Summary (Last 24 hours) at 04/06/2022 1128 Last data filed at 04/06/2022 1020 Gross per 24 hour  Intake 460 ml  Output --  Net 460 ml   Filed Weights   04/02/22 2000  Weight: 97.2 kg    Examination:  General exam: NAD Respiratory system: Coarse breath sounds bilaterally.  Normal work of breathing.  Room air Cardiovascular system: S1-S2, RRR, no murmurs, no pedal edema Gastrointestinal system: Soft, NT/ND, normal bowel sounds Central nervous system: Alert, oriented x2, no focal deficits Extremities: Symmetric 5 x 5 power. Skin: No rashes, lesions or ulcers Psychiatry: Judgement and insight appear impaired. Mood & affect flattened.     Data  Reviewed: I have personally reviewed following labs and imaging studies  CBC: Recent Labs  Lab 04/02/22 2007 04/05/22 0439 04/06/22 0819  WBC 21.4* 19.6* 19.5*  NEUTROABS 17.1*  --  16.5*  HGB 15.6* 13.9 14.3  HCT 48.6* 44.2 44.7  MCV 84.7 86.2 85.1  PLT 337 256 161   Basic Metabolic Panel: Recent Labs  Lab 04/02/22 2007 04/05/22 0439 04/06/22 0819  NA 134*  --  136  K 3.6  --  3.1*  CL 97*  --  98  CO2 26  --  29  GLUCOSE 128*  --  140*  BUN 12  --  16  CREATININE 0.51  --  0.49  CALCIUM 9.0  --  9.3  PHOS  --  3.9  --    GFR: Estimated Creatinine Clearance: 71.5 mL/min (by C-G formula based on SCr of 0.49 mg/dL). Liver Function Tests: Recent Labs  Lab 04/02/22 2007  AST 56*  ALT 14  ALKPHOS 465*  BILITOT 1.0  PROT 7.1  ALBUMIN 2.6*   No results for input(s): "LIPASE", "AMYLASE" in the last 168 hours. No results for input(s): "AMMONIA" in the last 168 hours. Coagulation Profile: No results for input(s): "INR", "PROTIME" in the last 168 hours. Cardiac Enzymes: No results for input(s): "CKTOTAL", "CKMB", "CKMBINDEX", "TROPONINI" in the last 168 hours. BNP (last 3 results) No results for input(s): "PROBNP" in the last 8760 hours. HbA1C: No results for input(s): "HGBA1C" in the last 72 hours. CBG: Recent Labs  Lab 04/05/22 0803 04/05/22 1329 04/05/22 1550 04/05/22 2159 04/06/22 0745  GLUCAP 152* 147* 128* 111* 155*   Lipid Profile: No results for input(s): "CHOL", "HDL", "LDLCALC", "TRIG", "CHOLHDL", "LDLDIRECT" in the last 72 hours. Thyroid Function Tests: No results for input(s): "TSH", "T4TOTAL", "FREET4", "T3FREE", "THYROIDAB" in the last 72 hours. Anemia Panel: Recent Labs    04/05/22 0439  VITAMINB12 203   Sepsis Labs: No results for input(s): "PROCALCITON", "LATICACIDVEN" in the last 168 hours.  Recent Results (from the past 240 hour(s))  Urine Culture     Status: Abnormal   Collection Time: 04/02/22  8:07 PM   Specimen: Urine,  Random  Result Value Ref Range Status   Specimen Description   Final    URINE, RANDOM Performed at [facility], [address] [place], [place] [address]    Special Requests   Final    NONE Performed at [facility], [address] [place], [place] [address]    Culture >=100,000 COLONIES/mL KLEBSIELLA PNEUMONIAE (A)  Final   Report Status 04/06/2022 FINAL  Final   Organism ID, Bacteria KLEBSIELLA PNEUMONIAE (A)  Final      Susceptibility   Klebsiella pneumoniae - MIC*    AMPICILLIN >=32 RESISTANT Resistant     CEFAZOLIN <=4 SENSITIVE Sensitive     CEFEPIME <=0.12 SENSITIVE Sensitive     CEFTRIAXONE <=0.25 SENSITIVE Sensitive     CIPROFLOXACIN <=0.25 SENSITIVE  Sensitive     GENTAMICIN <=1 SENSITIVE Sensitive     IMIPENEM <=0.25 SENSITIVE Sensitive     NITROFURANTOIN 128 RESISTANT Resistant     TRIMETH/SULFA <=20 SENSITIVE Sensitive     AMPICILLIN/SULBACTAM 8 SENSITIVE Sensitive     PIP/TAZO <=4 SENSITIVE Sensitive     * >=100,000 COLONIES/mL KLEBSIELLA PNEUMONIAE  MRSA Next Gen by PCR, Nasal     Status: None   Collection Time: 04/05/22 12:30 PM   Specimen: Nasal Mucosa; Nasal Swab  Result Value Ref Range Status   MRSA by PCR Next Gen NOT DETECTED NOT DETECTED Final    Comment: (NOTE) The GeneXpert MRSA Assay (FDA approved for NASAL specimens only), is one component of a comprehensive MRSA colonization surveillance program. It is not intended to diagnose MRSA infection nor to guide or monitor treatment for MRSA infections. Test performance is not FDA approved in patients less than 58 years old. Performed at [facility], [address] [place], [place] [address]          Radiology Studies: No results found.      Scheduled Meds:  diclofenac Sodium  4 g Topical QID   empagliflozin  10 mg Oral Daily   enoxaparin (LOVENOX) injection  0.5 mg/kg Subcutaneous Q24H   insulin aspart  0-15 Units Subcutaneous TID WC   insulin aspart  0-5  Units Subcutaneous QHS   lidocaine  1 patch Transdermal Q24H   metFORMIN  500 mg Oral BID WC   multivitamin with minerals  1 tablet Oral Daily   potassium chloride  40 mEq Oral Once   pravastatin  40 mg Oral Daily   Ensure Max Protein  11 oz Oral TID   vitamin B-12  1,000 mcg Oral Daily   Vitamin D (Ergocalciferol)  50,000 Units Oral Q7 days   Continuous Infusions:  [START ON 04/07/2022] cefTRIAXone (ROCEPHIN)  IV       LOS: 1 day     Sidney Ace, MD [facility]   If 7PM-7AM, please contact night-coverage  04/06/2022, 11:28 AM

## 2022-04-06 NOTE — TOC Progression Note (Signed)
Transition of Care [facility]) - Progression Note    Patient Details  Name: BELLARAE LIZER MRN: [id number] Date of Birth: [birth year]  [facility] CM/SW Contact  Eileen Stanford, LCSW Phone Number: 04/06/2022, 3:24 PM  Clinical Narrative:    Pt is confused. CSW has called [facility] several times and left two voicemails.   Expected Discharge Plan: [place] ([facility]) Barriers to Discharge: Continued Medical Work up  Expected Discharge Plan and Services Expected Discharge Plan: [place] ([facility])   Discharge Planning Services: CM Consult   Living arrangements for the past 2 months: [place]: [facility] [place] Determinants of Health (SDOH) Interventions    Readmission Risk Interventions    04/04/2022    1:53 PM  Readmission Risk Prevention Plan  Post Dischage Appt Complete  Medication Screening Complete  Transportation Screening Complete

## 2022-04-06 NOTE — Progress Notes (Signed)
Pt currently has fluids infusing with no s/s infiltration, redness, drainage or c/o pain.

## 2022-04-06 NOTE — Progress Notes (Signed)
PT Cancellation Note  Patient Details Name: April [name] MRN: [id number] DOB: 1946/01/04   Cancelled Treatment:    Reason Eval/Treat Not Completed: Fatigue/lethargy limiting ability to participate;Patient declined, no reason specified. Patient very lethargic. Wakes to talk to me briefly and declined any therapy today. Will re-attempt tomorrow.    Kindra Bickham 04/06/2022, 2:49 PM

## 2022-04-06 NOTE — Progress Notes (Signed)
Palliative:  I went for follow up with [name]. I was hoping to speak with her about Living Will but she was sound asleep. Noted that she was a little confused when speaking with [facility]. I will not awaken her as she was not prepared to make any firm decisions yesterday except not to pursue work up or treatment for lung nodule as she would not desire treatment if this was cancer. I will try follow up again tomorrow.   No charge  Vinie Sill, NP Palliative Medicine Team Pager [phone] (Please see [email or web address] for schedule) Team Phone [phone]

## 2022-04-06 NOTE — Progress Notes (Signed)
OT Cancellation Note  Patient Details Name: April [name] MRN: [id number] DOB: [birth year]   Cancelled Treatment:    Reason Eval/Treat Not Completed: Patient declined, no reason specified. Reviewed chart, attempted OT, but pt declined, saying she had slept very poorly last night and that "I just don't feel good right now." Will attempt OT services at a later date, as pt is available, willing, and medically appropriate.  Josiah Lobo, PhD, MS, OTR/L 04/06/22, 3:45 PM

## 2022-04-07 DIAGNOSIS — R918 Other nonspecific abnormal finding of lung field: Secondary | ICD-10-CM | POA: Diagnosis not present

## 2022-04-07 DIAGNOSIS — W19XXXA Unspecified fall, initial encounter: Secondary | ICD-10-CM | POA: Diagnosis not present

## 2022-04-07 DIAGNOSIS — Z7189 Other specified counseling: Secondary | ICD-10-CM | POA: Diagnosis not present

## 2022-04-07 DIAGNOSIS — Z515 Encounter for palliative care: Secondary | ICD-10-CM | POA: Diagnosis not present

## 2022-04-07 DIAGNOSIS — Y92009 Unspecified place in unspecified non-institutional (private) residence as the place of occurrence of the external cause: Secondary | ICD-10-CM | POA: Diagnosis not present

## 2022-04-07 LAB — GLUCOSE, CAPILLARY
Glucose-Capillary: 158 mg/dL — ABNORMAL HIGH (ref 70–99)
Glucose-Capillary: 130 mg/dL — ABNORMAL HIGH (ref 70–99)
Glucose-Capillary: 121 mg/dL — ABNORMAL HIGH (ref 70–99)
Glucose-Capillary: 114 mg/dL — ABNORMAL HIGH (ref 70–99)

## 2022-04-07 MED ORDER — MORPHINE SULFATE (PF) 2 MG/ML IV SOLN
2.0000 mg | INTRAVENOUS | Status: DC | PRN
  Administered 2022-04-07 – 2022-04-08 (×5): 2 mg via INTRAVENOUS
  Filled 2022-04-07 (×6): qty 1

## 2022-04-07 MED ORDER — OXYCODONE HCL 5 MG PO TABS
5.0000 mg | ORAL_TABLET | ORAL | Status: DC | PRN
  Administered 2022-04-07 – 2022-04-09 (×8): 5 mg via ORAL
  Filled 2022-04-07 (×8): qty 1

## 2022-04-07 MED ORDER — GABAPENTIN 300 MG PO CAPS
300.0000 mg | ORAL_CAPSULE | Freq: Three times a day (TID) | ORAL | Status: DC
  Administered 2022-04-07 – 2022-04-10 (×10): 300 mg via ORAL
  Filled 2022-04-07 (×10): qty 1

## 2022-04-07 NOTE — Progress Notes (Signed)
PROGRESS NOTE    April [name]  HUD:[id number] DOB: 18-Sep-1946 DOA: 04/02/2022 PCP: Albina Billet, MD    Brief Narrative:  76 y.o. female who lives independently with medical history significant for DM 2, HTN, PVD, chronic leukocytosis of unclear etiology, hospitalized in May 2023 following a fall resulting in a fractured humerus, with incidental finding of a left lung mass suspicious for malignancy for which patient desired no further work-up with PET scan as recommended by oncology, who presents to the ED following a mechanical fall onto her right knee as she was trying to get her clothes off.  She did not hit her head nor did she have loss of consciousness.  She denied preceding lightheadedness, palpitations or shortness of breath, headache, visual disturbance or one-sided weakness numbness or tingling.  Following the fall she only complained of right hip and knee pain and right shoulder pain.   Assessment & Plan:   Principal Problem:   Fall at home, initial encounter Active Problems:   Hypoxia   Frequent falls   Leukocytosis   Mass of lower lobe of left lung on CT chest 01/30/22   Type 2 diabetes mellitus ([place])   PAD (peripheral artery disease) (HCC)   Essential hypertension   Vitamin D deficiency   UTI (urinary tract infection)  Frequent falls Right knee pain secondary to fall History of fall in May 2023 resulting in fracture of the humerus Patient lives alone Patient may not be safe to return to home alone TOC has been unable to reach her contacts Plan: At this point patient is unsafe to discharge home [facility] aware and looking for options   Multimodal pain control    Mass of lower lobe of left lung on CT chest 01/30/22 Hypoxia  Likely secondary to lung mass suspicious for malignancy.  O2 sats in the mid 80s off oxygen in the ED Supplemental oxygen to keep sats over 92% Patient desires no further work-up of the lung mass Was seen by oncology on 5/7 and was not interested in  recommended PET scan 7/11 palliative following. She is full code. She has poor px overall.  Palliative to follow up   Leukocytosis likley from UTI UTI Ucx >100,00 Klebsiella pneumonia Plan: IV Rocephin, 3-day course Last dose 7/13, DC after   Vitamin D deficiency Level 16 Replacement ordered   Essential hypertension Stable. At times on low side   PAD (peripheral artery disease) (HCC) Continue  pravastatin   Type 2 diabetes mellitus ([place]) Okay to continue metformin and Jardiance for now  DVT prophylaxis: Lovenox Code Status: Full Family Communication: None today Disposition Plan: Status is: Observation The patient will require care spanning > 2 midnights and should be moved to inpatient because: UTI on IV antibiotics, unsafe discharge plan   Level of care: Med-Surg  Consultants:  Palliative care  Procedures:  None  Antimicrobials:   Subjective: Seen and examined.  More awake this morning.  Endorses back pain  Objective: Vitals:   04/06/22 0741 04/06/22 1634 04/06/22 1948 04/07/22 0408  BP: 137/73 136/65 124/67 132/79  Pulse: (!) 109 (!) 106 (!) 110 (!) 109  Resp: 14 15 20 16   Temp: 98.7 F (37.1 C) 97.9 F (36.6 C) (!) 97.4 F (36.3 C) 98.1 F (36.7 C)  TempSrc:      SpO2: 96% 95% 95% 98%  Weight:      Height:        Intake/Output Summary (Last 24 hours) at 04/07/2022 1017 Last data filed at  04/07/2022 0408 Gross per 24 hour  Intake 460 ml  Output 700 ml  Net -240 ml   Filed Weights   04/02/22 2000  Weight: 97.2 kg    Examination:  General exam: No acute distress Respiratory system: Bibasilar crackles.  Normal work of breathing.  Room air Cardiovascular system: S1-S2, RRR, no murmurs, no pedal edema Gastrointestinal system: Soft, NT/ND, normal bowel sounds Central nervous system: Alert, oriented x2, no focal deficits Extremities: Symmetric 5 x 5 power. Skin: No rashes, lesions or ulcers Psychiatry: Judgement and insight appear impaired.  Mood & affect flattened.     Data Reviewed: I have personally reviewed following labs and imaging studies  CBC: Recent Labs  Lab 04/02/22 2007 04/05/22 0439 04/06/22 0819  WBC 21.4* 19.6* 19.5*  NEUTROABS 17.1*  --  16.5*  HGB 15.6* 13.9 14.3  HCT 48.6* 44.2 44.7  MCV 84.7 86.2 85.1  PLT 337 256 580   Basic Metabolic Panel: Recent Labs  Lab 04/02/22 2007 04/05/22 0439 04/06/22 0819  NA 134*  --  136  K 3.6  --  3.1*  CL 97*  --  98  CO2 26  --  29  GLUCOSE 128*  --  140*  BUN 12  --  16  CREATININE 0.51  --  0.49  CALCIUM 9.0  --  9.3  PHOS  --  3.9  --    GFR: Estimated Creatinine Clearance: 71.5 mL/min (by C-G formula based on SCr of 0.49 mg/dL). Liver Function Tests: Recent Labs  Lab 04/02/22 2007  AST 56*  ALT 14  ALKPHOS 465*  BILITOT 1.0  PROT 7.1  ALBUMIN 2.6*   No results for input(s): "LIPASE", "AMYLASE" in the last 168 hours. No results for input(s): "AMMONIA" in the last 168 hours. Coagulation Profile: No results for input(s): "INR", "PROTIME" in the last 168 hours. Cardiac Enzymes: No results for input(s): "CKTOTAL", "CKMB", "CKMBINDEX", "TROPONINI" in the last 168 hours. BNP (last 3 results) No results for input(s): "PROBNP" in the last 8760 hours. HbA1C: No results for input(s): "HGBA1C" in the last 72 hours. CBG: Recent Labs  Lab 04/06/22 0745 04/06/22 1154 04/06/22 1659 04/06/22 2031 04/07/22 0741  GLUCAP 155* 129* 142* 155* 158*   Lipid Profile: No results for input(s): "CHOL", "HDL", "LDLCALC", "TRIG", "CHOLHDL", "LDLDIRECT" in the last 72 hours. Thyroid Function Tests: No results for input(s): "TSH", "T4TOTAL", "FREET4", "T3FREE", "THYROIDAB" in the last 72 hours. Anemia Panel: Recent Labs    04/05/22 0439  VITAMINB12 203   Sepsis Labs: No results for input(s): "PROCALCITON", "LATICACIDVEN" in the last 168 hours.  Recent Results (from the past 240 hour(s))  Urine Culture     Status: Abnormal   Collection Time:  04/02/22  8:07 PM   Specimen: Urine, Random  Result Value Ref Range Status   Specimen Description   Final    URINE, RANDOM Performed at [facility], [address] [place], [place] [address]    Special Requests   Final    NONE Performed at [facility], [address] [place], [place] [address]    Culture >=100,000 COLONIES/mL KLEBSIELLA PNEUMONIAE (A)  Final   Report Status 04/06/2022 FINAL  Final   Organism ID, Bacteria KLEBSIELLA PNEUMONIAE (A)  Final      Susceptibility   Klebsiella pneumoniae - MIC*    AMPICILLIN >=32 RESISTANT Resistant     CEFAZOLIN <=4 SENSITIVE Sensitive     CEFEPIME <=0.12 SENSITIVE Sensitive     CEFTRIAXONE <=0.25 SENSITIVE  Sensitive     CIPROFLOXACIN <=0.25 SENSITIVE Sensitive     GENTAMICIN <=1 SENSITIVE Sensitive     IMIPENEM <=0.25 SENSITIVE Sensitive     NITROFURANTOIN 128 RESISTANT Resistant     TRIMETH/SULFA <=20 SENSITIVE Sensitive     AMPICILLIN/SULBACTAM 8 SENSITIVE Sensitive     PIP/TAZO <=4 SENSITIVE Sensitive     * >=100,000 COLONIES/mL KLEBSIELLA PNEUMONIAE  MRSA Next Gen by PCR, Nasal     Status: None   Collection Time: 04/05/22 12:30 PM   Specimen: Nasal Mucosa; Nasal Swab  Result Value Ref Range Status   MRSA by PCR Next Gen NOT DETECTED NOT DETECTED Final    Comment: (NOTE) The GeneXpert MRSA Assay (FDA approved for NASAL specimens only), is one component of a comprehensive MRSA colonization surveillance program. It is not intended to diagnose MRSA infection nor to guide or monitor treatment for MRSA infections. Test performance is not FDA approved in patients less than 71 years old. Performed at [facility], [address] [place], [place] [address]          Radiology Studies: No results found.      Scheduled Meds:  diclofenac Sodium  4 g Topical QID   empagliflozin  10 mg Oral Daily   enoxaparin (LOVENOX) injection  0.5 mg/kg Subcutaneous Q24H   gabapentin  300 mg Oral TID    insulin aspart  0-15 Units Subcutaneous TID WC   insulin aspart  0-5 Units Subcutaneous QHS   lidocaine  1 patch Transdermal Q24H   metFORMIN  500 mg Oral BID WC   multivitamin with minerals  1 tablet Oral Daily   polyethylene glycol  17 g Oral Daily   pravastatin  40 mg Oral Daily   Ensure Max Protein  11 oz Oral TID   senna-docusate  1 tablet Oral BID   vitamin B-12  1,000 mcg Oral Daily   Vitamin D (Ergocalciferol)  50,000 Units Oral Q7 days   Continuous Infusions:     LOS: 1 day     Sidney Ace, MD [facility]   If 7PM-7AM, please contact night-coverage  04/07/2022, 10:17 AM

## 2022-04-07 NOTE — TOC Progression Note (Signed)
Transition of Care [facility]) - Progression Note    Patient Details  Name: April [name] MRN: [id number] Date of Birth: 10/06/1945  [facility]) CM/SW Contact  Eileen Stanford, LCSW Phone Number: 04/07/2022, 1:10 PM  Clinical Narrative:   Bed offers provided to pt and pt chooses [facility]. Pt states she has been there before. CSW to start insurance auth.    Expected Discharge Plan: [place] ([facility]) Barriers to Discharge: Continued Medical Work up  Expected Discharge Plan and Services Expected Discharge Plan: [place] ([facility])   Discharge Planning Services: CM Consult   Living arrangements for the past 2 months: [place]: Well [place] Determinants of Health (SDOH) Interventions    Readmission Risk Interventions    04/04/2022    1:53 PM  Readmission Risk Prevention Plan  Post Dischage Appt Complete  Medication Screening Complete  Transportation Screening Complete

## 2022-04-07 NOTE — Progress Notes (Signed)
Palliative:  HPI: 76 y.o. 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    I met again today with Ms. Ebony Hail. She is on a phone call when I enter her room but hangs up to speak with me. She tells me that she has had a difficult day with pain but is just now beginning to have some relief. She tells me that she enjoyed her soup today and has eaten some of her pizza (~30% but then seems she ate the toppings off most of the pizza and left the crust). She reports that her appetite is improving some. She shares that she is just now having some relief from pain medication and wants to take a nap at this time. She does not feel up to talking. I tell her that I will let her rest but I want to return tomorrow and review a Living Will with her. She reports that she would be open to this conversation tomorrow. I offer to bring a copy if she wishes to review and she declines. I assisted her with water and adjusting blinds in her room so she could rest more comfortably.   All questions/concerns addressed. Emotional support provided.   Exam: Alert, oriented. No distress. Generalized weakness and fatigue. Breathing regular, unlabored. Abd soft. BLE edema.   Plan: - Will follow up with her tomorrow to discuss Living Will.   25 min  Vinie Sill, NP Palliative Medicine Team Pager [phone] (Please see [email or web address] for schedule) Team Phone [phone]    Greater than 50%  of this time was spent counseling and coordinating care related to the above assessment and plan

## 2022-04-07 NOTE — Progress Notes (Signed)
Physical Therapy Treatment Patient Details Name: April [name] MRN: [id number] DOB: [birth year] Today's Date: 04/07/2022   History of Present Illness Per MD H&P: April [name] is a 76 y.o. female who lives independently with medical history significant for DM 2, HTN, PVD, chronic leukocytosis of unclear etiology, hospitalized in May 2023 following a fall resulting in a fractured humerus, with incidental finding of a left lung mass suspicious for malignancy for which patient desired no further work-up with PET scan as recommended by oncology, who presents to the ED following a mechanical fall onto her right knee as she was trying to get her clothes off.  She did not hit her head nor did she have loss of consciousness.  She denied preceding lightheadedness, palpitations or shortness of breath, headache, visual disturbance or one-sided weakness numbness or tingling.  Following the fall she only complained of right hip and knee pain and right shoulder pain.    PT Comments    Patient alert, and not agreeable to PT initially. With MAX encouragement, education, and time pt agreeable to some intervention. She was able to move all extremities against gravity, except RUE. Did not move the shoulder, but able to flex/extend R elbow. Pt very particular about bed set up and assistance given; ultimately able to initiate supine to sit with moving BLE (needed assistance for RLE) and maxAx2 to come up into sitting and reposition. With time and BUE propped, able to sit with CGA. Sit <> stand attempted twice. First attempt pt sat due to increased anxiety and pain. with MAX encouragement education and extended time agreed to second attempt to move dirty linens. pt does not come up into fully standing. Returned to supine with maxAx2. Extended time at end of session to reposition pt and attend to needs and questions. Recommendation at this time remains SNF due to current level of assistance needed.      Recommendations for  follow up therapy are one component of a multi-disciplinary discharge planning process, led by the attending physician.  Recommendations may be updated based on patient status, additional functional criteria and insurance authorization.  Follow Up Recommendations  Skilled nursing-short term rehab (<3 hours/day) Can patient physically be transported by private vehicle: No   Assistance Recommended at Discharge Intermittent Supervision/Assistance  Patient can return home with the following A lot of help with walking and/or transfers;A lot of help with bathing/dressing/bathroom;Assist for transportation;Assistance with cooking/housework;Help with stairs or ramp for entrance   Equipment Recommendations  Other (comment) (TBD at next venue of care)    Recommendations for Other Services       Precautions / Restrictions Precautions Precautions: Fall;Shoulder Type of Shoulder Precautions: Pt 2 months out humeral fracture. Per Dr. Sharlet Salina, allowed gentle strengthening and shoulder mobility. Sling no longer required, no WB restrictions Precaution Booklet Issued: No Restrictions Weight Bearing Restrictions: No     Mobility  Bed Mobility Overal bed mobility: Needs Assistance Bed Mobility: Supine to Sit, Sit to Supine     Supine to sit: HOB elevated, Max assist Sit to supine: HOB elevated, Max assist, +2 for physical assistance        Transfers Overall transfer level: Needs assistance Equipment used: Rolling walker (2 wheels) Transfers: Sit to/from Stand Sit to Stand: Max assist, +2 physical assistance           General transfer comment: attempted twice. first attempt pt sat due to increased anxiety and pain. with MAX encouragement education and extended time agreed to second attempt to move  dirty linens. pt does not come up into fully standing    Ambulation/Gait               General Gait Details: pt declined/adamant about returning to sitting   Stairs              Wheelchair Mobility    Modified Rankin (Stroke Patients Only)       Balance Overall balance assessment: Needs assistance Sitting-balance support: Feet supported, Single extremity supported Sitting balance-Leahy Scale: Fair       Standing balance-Leahy Scale: Zero                              Cognition Arousal/Alertness: Awake/alert Behavior During Therapy: Anxious Overall Cognitive Status: No family/caregiver present to determine baseline cognitive functioning                                 General Comments: able to follow commands with extended time, very anxious        Exercises      General Comments        Pertinent Vitals/Pain Pain Assessment Pain Assessment: Faces Faces Pain Scale: Hurts even more Pain Location: R hip, back pain, L hip, genital area, R shoulder Pain Descriptors / Indicators: Aching, Discomfort, Dull, Guarding, Grimacing, Moaning Pain Intervention(s): Limited activity within patient's tolerance, Monitored during session, Repositioned, Premedicated before session    Home Living                          Prior Function            PT Goals (current goals can now be found in the care plan section) Progress towards PT goals:  (very limited progress due to pt anxiety)    Frequency    Min 2X/week      PT Plan Current plan remains appropriate    Co-evaluation              AM-PAC PT "6 Clicks" Mobility   Outcome Measure  Help needed turning from your back to your side while in a flat bed without using bedrails?: A Lot Help needed moving from lying on your back to sitting on the side of a flat bed without using bedrails?: A Lot Help needed moving to and from a bed to a chair (including a wheelchair)?: Total Help needed standing up from a chair using your arms (e.g., wheelchair or bedside chair)?: Total Help needed to walk in hospital room?: Total Help needed climbing 3-5 steps with a  railing? : Total 6 Click Score: 8    End of Session Equipment Utilized During Treatment: Oxygen Activity Tolerance: Other (comment);Patient limited by fatigue (limited by pt anxiety) Patient left: in bed;with call bell/phone within reach;with bed alarm set Nurse Communication: Mobility status PT Visit Diagnosis: Muscle weakness (generalized) (M62.81);Difficulty in walking, not elsewhere classified (R26.2)     Time: 2683-4196 PT Time Calculation (min) (ACUTE ONLY): 28 min  Charges:  $Therapeutic Activity: 23-37 mins                     Lieutenant Diego PT, DPT 10:15 AM,04/07/22

## 2022-04-07 NOTE — Progress Notes (Signed)
OT Cancellation Note  Patient Details Name: April [name] MRN: [id number] DOB: 1946-01-17   Cancelled Treatment:    Reason Eval/Treat Not Completed: Patient declined, no reason specified;Pain limiting ability to participate. Pt declining OT, endorsing 8/10 pain in chest, RUE, and back with any  movement. RN notified per pt's request. Assisted with dialing dining services so she could adjust her dinner order. HR 119-121 at rest, encouraged PLB. Will re-attempt OT tx at later date/time as pt is agreeable and appropriate.   Ardeth Perfect., MPH, MS, OTR/L ascom [phone] 04/07/22, 4:49 PM

## 2022-04-08 DIAGNOSIS — Z515 Encounter for palliative care: Secondary | ICD-10-CM | POA: Diagnosis not present

## 2022-04-08 DIAGNOSIS — Y92009 Unspecified place in unspecified non-institutional (private) residence as the place of occurrence of the external cause: Secondary | ICD-10-CM | POA: Diagnosis not present

## 2022-04-08 DIAGNOSIS — W19XXXA Unspecified fall, initial encounter: Secondary | ICD-10-CM | POA: Diagnosis not present

## 2022-04-08 DIAGNOSIS — R0902 Hypoxemia: Secondary | ICD-10-CM | POA: Diagnosis not present

## 2022-04-08 DIAGNOSIS — Z7189 Other specified counseling: Secondary | ICD-10-CM | POA: Diagnosis not present

## 2022-04-08 DIAGNOSIS — R918 Other nonspecific abnormal finding of lung field: Secondary | ICD-10-CM | POA: Diagnosis not present

## 2022-04-08 LAB — COMPREHENSIVE METABOLIC PANEL
ALT: 20 U/L (ref 0–44)
AST: 44 U/L — ABNORMAL HIGH (ref 15–41)
Albumin: 2.2 g/dL — ABNORMAL LOW (ref 3.5–5.0)
Alkaline Phosphatase: 574 U/L — ABNORMAL HIGH (ref 38–126)
Anion gap: 7 (ref 5–15)
BUN: 13 mg/dL (ref 8–23)
CO2: 32 mmol/L (ref 22–32)
Calcium: 9.7 mg/dL (ref 8.9–10.3)
Chloride: 100 mmol/L (ref 98–111)
Creatinine, Ser: 0.42 mg/dL — ABNORMAL LOW (ref 0.44–1.00)
GFR, Estimated: >60 mL/min (ref >60)
Glucose, Bld: 118 mg/dL — ABNORMAL HIGH (ref 70–99)
Potassium: 5 mmol/L (ref 3.5–5.1)
Sodium: 139 mmol/L (ref 135–145)
Total Bilirubin: 0.5 mg/dL (ref 0.3–1.2)
Total Protein: 6.6 g/dL (ref 6.5–8.1)

## 2022-04-08 LAB — GLUCOSE, CAPILLARY
Glucose-Capillary: 131 mg/dL — ABNORMAL HIGH (ref 70–99)
Glucose-Capillary: 132 mg/dL — ABNORMAL HIGH (ref 70–99)
Glucose-Capillary: 158 mg/dL — ABNORMAL HIGH (ref 70–99)
Glucose-Capillary: 135 mg/dL — ABNORMAL HIGH (ref 70–99)

## 2022-04-08 LAB — MAGNESIUM: Magnesium: 2 mg/dL (ref 1.7–2.4)

## 2022-04-08 MED ORDER — MORPHINE SULFATE (PF) 2 MG/ML IV SOLN
2.0000 mg | Freq: Four times a day (QID) | INTRAVENOUS | Status: DC | PRN
  Administered 2022-04-08 – 2022-04-09 (×2): 2 mg via INTRAVENOUS
  Filled 2022-04-08 (×2): qty 1

## 2022-04-08 MED ORDER — METHOCARBAMOL 500 MG PO TABS
500.0000 mg | ORAL_TABLET | Freq: Three times a day (TID) | ORAL | Status: DC
  Administered 2022-04-08 (×3): 500 mg via ORAL
  Filled 2022-04-08 (×4): qty 1

## 2022-04-08 MED ORDER — ACETAMINOPHEN 500 MG PO TABS
1000.0000 mg | ORAL_TABLET | Freq: Three times a day (TID) | ORAL | Status: DC
  Administered 2022-04-08 – 2022-04-10 (×5): 1000 mg via ORAL
  Filled 2022-04-08 (×6): qty 2

## 2022-04-08 NOTE — Progress Notes (Signed)
   04/08/22 3785  Assess: MEWS Score  Temp 99.3 F (37.4 C)  BP 136/71  MAP (mmHg) 90  Pulse Rate (!) 115  Resp 19  SpO2 94 %  O2 Device Room Air  Assess: MEWS Score  MEWS Temp 0  MEWS Systolic 0  MEWS Pulse 2  MEWS RR 0  MEWS LOC 0  MEWS Score 2  MEWS Score Color Yellow  Assess: if the MEWS score is Yellow or Red  Were vital signs taken at a resting state? Yes  Focused Assessment No change from prior assessment  Does the patient meet 2 or more of the SIRS criteria? Yes  Does the patient have a confirmed or suspected source of infection? No  MEWS guidelines implemented *See Row Information* Yes  Escalate  MEWS: Escalate Yellow: discuss with charge nurse/RN and consider discussing with provider and RRT  Notify: Charge Nurse/RN  Name of Charge Nurse/RN Notified Estill Bamberg RN  Date Charge Nurse/RN Notified 04/08/22  Time Charge Nurse/RN Notified 8850  Notify: Provider  Provider Name/Title Dr Priscella Mann  Date Provider Notified 04/08/22  Time Provider Notified 0830  Method of Notification Face-to-face  Notification Reason Other (Comment) (yellow mews)  Provider response At bedside  Date of Provider Response 04/08/22  Time of Provider Response 0831  Assess: SIRS CRITERIA  SIRS Temperature  0  SIRS Pulse 1  SIRS Respirations  0  SIRS WBC 0  SIRS Score Sum  1

## 2022-04-08 NOTE — Progress Notes (Signed)
   04/08/22 0442  Assess: MEWS Score  Temp 97.8 F (36.6 C)  BP 139/68  MAP (mmHg) 89  Pulse Rate (!) 118  Resp 18  SpO2 96 %  O2 Device Room Air  Assess: MEWS Score  MEWS Temp 0  MEWS Systolic 0  MEWS Pulse 2  MEWS RR 0  MEWS LOC 0  MEWS Score 2  MEWS Score Color Yellow  Assess: if the MEWS score is Yellow or Red  Were vital signs taken at a resting state? Yes  Focused Assessment Change from prior assessment (see assessment flowsheet)  Does the patient meet 2 or more of the SIRS criteria? No  MEWS guidelines implemented *See Row Information* Yes  Take Vital Signs  Increase Vital Sign Frequency  Yellow: Q 2hr X 2 then Q 4hr X 2, if remains yellow, continue Q 4hrs  Escalate  MEWS: Escalate Yellow: discuss with charge nurse/RN and consider discussing with provider and RRT  Notify: Charge Nurse/RN  Name of Charge Nurse/RN Notified Jackie, RN  Date Charge Nurse/RN Notified 04/08/22  Time Charge Nurse/RN Notified 0446  Notify: Provider  Provider Name/Title Mansy, MD  Date Provider Notified 04/08/22  Time Provider Notified 0448  Method of Notification Page  Notification Reason Change in status  Provider response Other (Comment)  Document  Patient Outcome Stabilized after interventions  Progress note created (see row info) Yes  Assess: SIRS CRITERIA  SIRS Temperature  0  SIRS Pulse 1  SIRS Respirations  0  SIRS WBC 0  SIRS Score Sum  1

## 2022-04-08 NOTE — TOC Progression Note (Signed)
Transition of Care [facility]) - Progression Note    Patient Details  Name: April [name] MRN: [id number] Date of Birth: 1945-11-25  Transition of [facility]) CM/SW Contact  Eileen Stanford, LCSW Phone Number: 04/08/2022, 8:31 AM  Clinical Narrative:   Lowella Curb for admit to [facility] MD notified.    Expected Discharge Plan: [place] ([facility]) Barriers to Discharge: Continued Medical Work up  Expected Discharge Plan and Services Expected Discharge Plan: [place] ([facility])   Discharge Planning Services: CM Consult   Living arrangements for the past 2 months: [place]: Well [place] Determinants of Health (SDOH) Interventions    Readmission Risk Interventions    04/04/2022    1:53 PM  Readmission Risk Prevention Plan  Post Dischage Appt Complete  Medication Screening Complete  Transportation Screening Complete

## 2022-04-08 NOTE — Progress Notes (Signed)
Palliative:  HPI: 76 y.o. 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    I met again today with April [name]. She was on the phone speaking with her friend but got off phone when I come in room. She is moaning and expresses pain in her back. She cannot get comfortable. She shares that this pain is chronic and she has this at home until she can get someone to come rub her back (i.e. apply voltaren gel). We discussed management and I will order k-pad and scheduled Tylenol. She agrees that morphine IV is too strong and wishes to stick with OxyIR which I recommend. Noted addition of gabapentin as well.   I attempted to review Advance Directive with April [name] and she agrees to discuss with me. However, every item I review and discuss April [name] tells me that it is difficult to know what she would want at the time. I tried to explain the importance of considering these decisions while she is able to speak for herself. I also discuss with her the expectations and poor prognosis in any of the scenarios and if she were to have a cardiac arrest. She verbalizes understanding of poor prognosis and suffering with these interventions but is unable to make any decisions. I encouraged her to keep Living Will and to speak with her friend April [name] regarding these decisions. I offered to call her friend and update on situation but April [name] declines for me to call anyone on her behalf.   I am not sure what else I can do to assist with goals of care. I am not sure that she believes or accepts that she likely has cancer. I recommend  outpatient palliative care to continue efforts and support.   All questions/concerns addressed. Emotional support provided.   Exam: Alert, oriented. Difficult to stay on conversation. No distress. Generalized weakness and fatigue. Breathing regular, unlabored. Abd soft. BLE edema.   Plan: - Recommend outpatient palliative care.   [place], NP Palliative Medicine Team Pager [phone] (Please see [email or web address] for schedule) Team Phone [phone]    Greater than 50%  of this time was spent counseling and coordinating care related to the above assessment and plan

## 2022-04-08 NOTE — Progress Notes (Signed)
PROGRESS NOTE    April [name]  IDP:[id number] DOB: 21-Feb-1946 DOA: 04/02/2022 PCP: Albina Billet, MD    Brief Narrative:  76 y.o. female who lives independently with medical history significant for DM 2, HTN, PVD, chronic leukocytosis of unclear etiology, hospitalized in May 2023 following a fall resulting in a fractured humerus, with incidental finding of a left lung mass suspicious for malignancy for which patient desired no further work-up with PET scan as recommended by oncology, who presents to the ED following a mechanical fall onto her right knee as she was trying to get her clothes off.  She did not hit her head nor did she have loss of consciousness.  She denied preceding lightheadedness, palpitations or shortness of breath, headache, visual disturbance or one-sided weakness numbness or tingling.  Following the fall she only complained of right hip and knee pain and right shoulder pain.  Insurance authorization obtained for [employer] on 7/14.  Attempting to wean off IV narcotics.  Anticipated date of discharge 7/15   Assessment & Plan:   Principal Problem:   Fall at home, initial encounter Active Problems:   Hypoxia   Frequent falls   Leukocytosis   Mass of lower lobe of left lung on CT chest 01/30/22   Type 2 diabetes mellitus (HCC)   PAD (peripheral artery disease) (HCC)   Essential hypertension   Vitamin D deficiency   UTI (urinary tract infection)  Frequent falls Right knee pain secondary to fall History of fall in May 2023 resulting in fracture of the humerus Patient lives alone Patient may not be safe to return to home alone TOC has been unable to reach her contacts Plan: At this point patient is unsafe to discharge home Insurance authorization obtained for [facility] Continue multimodal pain control Wean IV narcotics If able to wean IV narcotics anticipate discharge 7/15    Mass of lower lobe of left lung on CT chest 01/30/22 Hypoxia  Likely  secondary to lung mass suspicious for malignancy.  O2 sats in the mid 80s off oxygen in the ED Supplemental oxygen to keep sats over 92% Patient desires no further work-up of the lung mass Was seen by oncology on 5/7 and was not interested in recommended PET scan 7/11 palliative following. She is full code. She has poor px overall.  Palliative to follow up   Leukocytosis likley from UTI UTI Ucx >100,00 Klebsiella pneumonia Plan: IV Rocephin, 3-day course Completed 3-day course on 7/13   Vitamin D deficiency Level 16 Replacement ordered   Essential hypertension Stable. At times on low side   PAD (peripheral artery disease) (HCC) Continue  pravastatin   Type 2 diabetes mellitus ([place]) Okay to continue metformin and Jardiance for now  DVT prophylaxis: Lovenox Code Status: Full Family Communication: None today Disposition Plan: Status is: Observation The patient will require care spanning > 2 midnights and should be moved to inpatient because: UTI on IV antibiotics, unsafe discharge plan   Level of care: Med-Surg  Consultants:  Palliative care  Procedures:  None  Antimicrobials:   Subjective: Seen and examined.  More awake this morning.  Continues to endorse back pain.  Seems to perseverate on morphine  Objective: Vitals:   04/07/22 2019 04/08/22 0442 04/08/22 0652 04/08/22 0822  BP: 136/70 139/68 124/63 136/71  Pulse: (!) 107 (!) 118 (!) 118 (!) 115  Resp: 19 18 18 19   Temp: 97.9 F (36.6 C) 97.8 F (36.6 C) 98.1 F (36.7 C) 99.3 F (37.4  C)  TempSrc: Oral  Oral   SpO2: 95% 96% 93% 94%  Weight:      Height:        Intake/Output Summary (Last 24 hours) at 04/08/2022 1156 Last data filed at 04/08/2022 0436 Gross per 24 hour  Intake 340 ml  Output 1500 ml  Net -1160 ml   Filed Weights   04/02/22 2000  Weight: 97.2 kg    Examination:  General exam: NAD Respiratory system: Scattered crackles.  Normal work of breathing.  2 L Cardiovascular system:  S1-S2, RRR, no murmurs, no pedal edema Gastrointestinal system: Soft, NT/ND, normal bowel sounds Central nervous system: Alert, oriented x2, no focal deficits Extremities: Symmetric 5 x 5 power. Skin: No rashes, lesions or ulcers Psychiatry: Judgement and insight appear impaired. Mood & affect flattened.     Data Reviewed: I have personally reviewed following labs and imaging studies  CBC: Recent Labs  Lab 04/02/22 2007 04/05/22 0439 04/06/22 0819  WBC 21.4* 19.6* 19.5*  NEUTROABS 17.1*  --  16.5*  HGB 15.6* 13.9 14.3  HCT 48.6* 44.2 44.7  MCV 84.7 86.2 85.1  PLT 337 256 409   Basic Metabolic Panel: Recent Labs  Lab 04/02/22 2007 04/05/22 0439 04/06/22 0819 04/08/22 0540  NA 134*  --  136 139  K 3.6  --  3.1* 5.0  CL 97*  --  98 100  CO2 26  --  29 32  GLUCOSE 128*  --  140* 118*  BUN 12  --  16 13  CREATININE 0.51  --  0.49 0.42*  CALCIUM 9.0  --  9.3 9.7  MG  --   --   --  2.0  PHOS  --  3.9  --   --    GFR: Estimated Creatinine Clearance: 71.5 mL/min (A) (by C-G formula based on SCr of 0.42 mg/dL (L)). Liver Function Tests: Recent Labs  Lab 04/02/22 2007 04/08/22 0540  AST 56* 44*  ALT 14 20  ALKPHOS 465* 574*  BILITOT 1.0 0.5  PROT 7.1 6.6  ALBUMIN 2.6* 2.2*   No results for input(s): "LIPASE", "AMYLASE" in the last 168 hours. No results for input(s): "AMMONIA" in the last 168 hours. Coagulation Profile: No results for input(s): "INR", "PROTIME" in the last 168 hours. Cardiac Enzymes: No results for input(s): "CKTOTAL", "CKMB", "CKMBINDEX", "TROPONINI" in the last 168 hours. BNP (last 3 results) No results for input(s): "PROBNP" in the last 8760 hours. HbA1C: No results for input(s): "HGBA1C" in the last 72 hours. CBG: Recent Labs  Lab 04/07/22 1205 04/07/22 1723 04/07/22 2043 04/08/22 0827 04/08/22 1155  GLUCAP 130* 121* 114* 131* 132*   Lipid Profile: No results for input(s): "CHOL", "HDL", "LDLCALC", "TRIG", "CHOLHDL", "LDLDIRECT" in  the last 72 hours. Thyroid Function Tests: No results for input(s): "TSH", "T4TOTAL", "FREET4", "T3FREE", "THYROIDAB" in the last 72 hours. Anemia Panel: No results for input(s): "VITAMINB12", "FOLATE", "FERRITIN", "TIBC", "IRON", "RETICCTPCT" in the last 72 hours.  Sepsis Labs: No results for input(s): "PROCALCITON", "LATICACIDVEN" in the last 168 hours.  Recent Results (from the past 240 hour(s))  Urine Culture     Status: Abnormal   Collection Time: 04/02/22  8:07 PM   Specimen: Urine, Random  Result Value Ref Range Status   Specimen Description   Final    URINE, RANDOM Performed at [facility], [address] [place], [place] [address]    Special Requests   Final    NONE Performed at [facility], [phone]  [address] [place], [place] [address]    Culture >=100,000 COLONIES/mL KLEBSIELLA PNEUMONIAE (A)  Final   Report Status 04/06/2022 FINAL  Final   Organism ID, Bacteria KLEBSIELLA PNEUMONIAE (A)  Final      Susceptibility   Klebsiella pneumoniae - MIC*    AMPICILLIN >=32 RESISTANT Resistant     CEFAZOLIN <=4 SENSITIVE Sensitive     CEFEPIME <=0.12 SENSITIVE Sensitive     CEFTRIAXONE <=0.25 SENSITIVE Sensitive     CIPROFLOXACIN <=0.25 SENSITIVE Sensitive     GENTAMICIN <=1 SENSITIVE Sensitive     IMIPENEM <=0.25 SENSITIVE Sensitive     NITROFURANTOIN 128 RESISTANT Resistant     TRIMETH/SULFA <=20 SENSITIVE Sensitive     AMPICILLIN/SULBACTAM 8 SENSITIVE Sensitive     PIP/TAZO <=4 SENSITIVE Sensitive     * >=100,000 COLONIES/mL KLEBSIELLA PNEUMONIAE  MRSA Next Gen by PCR, Nasal     Status: None   Collection Time: 04/05/22 12:30 PM   Specimen: Nasal Mucosa; Nasal Swab  Result Value Ref Range Status   MRSA by PCR Next Gen NOT DETECTED NOT DETECTED Final    Comment: (NOTE) The GeneXpert MRSA Assay (FDA approved for NASAL specimens only), is one component of a comprehensive MRSA colonization surveillance program. It is not intended to diagnose MRSA  infection nor to guide or monitor treatment for MRSA infections. Test performance is not FDA approved in patients less than 4 years old. Performed at [facility], [address] [place], [place] [address]          Radiology Studies: No results found.      Scheduled Meds:  diclofenac Sodium  4 g Topical QID   empagliflozin  10 mg Oral Daily   enoxaparin (LOVENOX) injection  0.5 mg/kg Subcutaneous Q24H   gabapentin  300 mg Oral TID   insulin aspart  0-15 Units Subcutaneous TID WC   insulin aspart  0-5 Units Subcutaneous QHS   lidocaine  1 patch Transdermal Q24H   metFORMIN  500 mg Oral BID WC   methocarbamol  500 mg Oral TID   multivitamin with minerals  1 tablet Oral Daily   polyethylene glycol  17 g Oral Daily   pravastatin  40 mg Oral Daily   Ensure Max Protein  11 oz Oral TID   senna-docusate  1 tablet Oral BID   vitamin B-12  1,000 mcg Oral Daily   Vitamin D (Ergocalciferol)  50,000 Units Oral Q7 days   Continuous Infusions:     LOS: 1 day     Sidney Ace, MD [facility]   If 7PM-7AM, please contact night-coverage  04/08/2022, 11:56 AM

## 2022-04-09 ENCOUNTER — Observation Stay: Payer: Medicare HMO

## 2022-04-09 DIAGNOSIS — I10 Essential (primary) hypertension: Secondary | ICD-10-CM | POA: Diagnosis not present

## 2022-04-09 DIAGNOSIS — Y92009 Unspecified place in unspecified non-institutional (private) residence as the place of occurrence of the external cause: Secondary | ICD-10-CM | POA: Diagnosis not present

## 2022-04-09 DIAGNOSIS — W19XXXA Unspecified fall, initial encounter: Secondary | ICD-10-CM | POA: Diagnosis not present

## 2022-04-09 LAB — GLUCOSE, CAPILLARY
Glucose-Capillary: 128 mg/dL — ABNORMAL HIGH (ref 70–99)
Glucose-Capillary: 176 mg/dL — ABNORMAL HIGH (ref 70–99)
Glucose-Capillary: 133 mg/dL — ABNORMAL HIGH (ref 70–99)
Glucose-Capillary: 108 mg/dL — ABNORMAL HIGH (ref 70–99)

## 2022-04-09 MED ORDER — OXYCODONE HCL 5 MG PO TABS
5.0000 mg | ORAL_TABLET | ORAL | Status: DC | PRN
  Administered 2022-04-09 – 2022-04-10 (×3): 10 mg via ORAL
  Filled 2022-04-09 (×5): qty 2

## 2022-04-09 MED ORDER — METHOCARBAMOL 750 MG PO TABS
750.0000 mg | ORAL_TABLET | Freq: Four times a day (QID) | ORAL | Status: DC
Start: 2022-04-09 — End: 2022-04-09
  Administered 2022-04-09 (×2): 750 mg via ORAL
  Filled 2022-04-09 (×3): qty 1

## 2022-04-09 NOTE — Progress Notes (Signed)
PROGRESS NOTE    April [name]  HUD:[id number] DOB: Dec 21, 1945 DOA: 04/02/2022 PCP: Albina Billet, MD    Brief Narrative:  76 y.o. female who lives independently with medical history significant for DM 2, HTN, PVD, chronic leukocytosis of unclear etiology, hospitalized in May 2023 following a fall resulting in a fractured humerus, with incidental finding of a left lung mass suspicious for malignancy for which patient desired no further work-up with PET scan as recommended by oncology, who presents to the ED following a mechanical fall onto her right knee as she was trying to get her clothes off.  She did not hit her head nor did she have loss of consciousness.  She denied preceding lightheadedness, palpitations or shortness of breath, headache, visual disturbance or one-sided weakness numbness or tingling.  Following the fall she only complained of right hip and knee pain and right shoulder pain.  Insurance authorization obtained for [employer] on 7/14.  Attempting to wean off IV narcotics.  Pain control improved but not yet at goal.  Anticipated date of discharge 7/16   Assessment & Plan:   Principal Problem:   Fall at home, initial encounter Active Problems:   Hypoxia   Frequent falls   Leukocytosis   Mass of lower lobe of left lung on CT chest 01/30/22   Type 2 diabetes mellitus (HCC)   PAD (peripheral artery disease) (HCC)   Essential hypertension   Vitamin D deficiency   UTI (urinary tract infection)  Frequent falls Right knee pain secondary to fall History of fall in May 2023 resulting in fracture of the humerus Patient lives alone Patient may not be safe to return to home alone TOC has been unable to reach her contacts Plan: At this point patient is unsafe to discharge home Insurance authorization obtained for [facility] Continue multimodal pain control Discontinue IV narcotics 7/15 If patient remains stable anticipate discharge to [facility]  7/16    Mass of lower lobe of left lung on CT chest 01/30/22 Hypoxia  Likely secondary to lung mass suspicious for malignancy.  O2 sats in the mid 80s off oxygen in the ED Supplemental oxygen to keep sats over 92% Patient desires no further work-up of the lung mass Was seen by oncology on 5/7 and was not interested in recommended PET scan 7/11 palliative following. She is full code. She has poor px overall.  Palliative to follow up   Leukocytosis likley from UTI UTI Ucx >100,00 Klebsiella pneumonia Plan: IV Rocephin, 3-day course Completed 3-day course on 7/13   Vitamin D deficiency Level 16 Replacement ordered   Essential hypertension Stable. At times on low side   PAD (peripheral artery disease) (HCC) Continue  pravastatin   Type 2 diabetes mellitus ([place]) Okay to continue metformin and Jardiance for now  DVT prophylaxis: Lovenox Code Status: Full Family Communication: Ashok Norris at bedside 7/15 Disposition Plan: Status is: Observation The patient will require care spanning > 2 midnights and should be moved to inpatient because: UTI on IV antibiotics, unsafe discharge plan   Level of care: Med-Surg  Consultants:  Palliative care  Procedures:  None  Antimicrobials:   Subjective: Seen and examined.  Continues to perseverate on pain and oxycodone  Objective: Vitals:   04/08/22 2027 04/09/22 0544 04/09/22 0938 04/09/22 1000  BP: 122/69 137/72 131/65   Pulse: (!) 104 (!) 107 (!) 119 (!) 101  Resp: 19 20 18    Temp: 97.7 F (36.5 C) 98.2 F (36.8 C) 98.6 F (  37 C)   TempSrc:      SpO2: 93% 91% 90%   Weight:      Height:        Intake/Output Summary (Last 24 hours) at 04/09/2022 1234 Last data filed at 04/09/2022 0544 Gross per 24 hour  Intake --  Output 750 ml  Net -750 ml   Filed Weights   04/02/22 2000  Weight: 97.2 kg    Examination:  General exam: No acute distress Respiratory system: Scattered crackles.  Normal work of breathing.  2  L Cardiovascular system: S1-S2, RRR, no murmurs, no pedal edema Gastrointestinal system: Soft, NT/ND, normal bowel sounds Central nervous system: Alert, oriented x2, no focal deficits Extremities: Symmetric 5 x 5 power. Skin: No rashes, lesions or ulcers Psychiatry: Judgement and insight appear impaired. Mood & affect flattened.     Data Reviewed: I have personally reviewed following labs and imaging studies  CBC: Recent Labs  Lab 04/02/22 2007 04/05/22 0439 04/06/22 0819  WBC 21.4* 19.6* 19.5*  NEUTROABS 17.1*  --  16.5*  HGB 15.6* 13.9 14.3  HCT 48.6* 44.2 44.7  MCV 84.7 86.2 85.1  PLT 337 256 732   Basic Metabolic Panel: Recent Labs  Lab 04/02/22 2007 04/05/22 0439 04/06/22 0819 04/08/22 0540  NA 134*  --  136 139  K 3.6  --  3.1* 5.0  CL 97*  --  98 100  CO2 26  --  29 32  GLUCOSE 128*  --  140* 118*  BUN 12  --  16 13  CREATININE 0.51  --  0.49 0.42*  CALCIUM 9.0  --  9.3 9.7  MG  --   --   --  2.0  PHOS  --  3.9  --   --    GFR: Estimated Creatinine Clearance: 71.5 mL/min (A) (by C-G formula based on SCr of 0.42 mg/dL (L)). Liver Function Tests: Recent Labs  Lab 04/02/22 2007 04/08/22 0540  AST 56* 44*  ALT 14 20  ALKPHOS 465* 574*  BILITOT 1.0 0.5  PROT 7.1 6.6  ALBUMIN 2.6* 2.2*   No results for input(s): "LIPASE", "AMYLASE" in the last 168 hours. No results for input(s): "AMMONIA" in the last 168 hours. Coagulation Profile: No results for input(s): "INR", "PROTIME" in the last 168 hours. Cardiac Enzymes: No results for input(s): "CKTOTAL", "CKMB", "CKMBINDEX", "TROPONINI" in the last 168 hours. BNP (last 3 results) No results for input(s): "PROBNP" in the last 8760 hours. HbA1C: No results for input(s): "HGBA1C" in the last 72 hours. CBG: Recent Labs  Lab 04/08/22 1155 04/08/22 1605 04/08/22 2212 04/09/22 0935 04/09/22 1118  GLUCAP 132* 158* 135* 128* 176*   Lipid Profile: No results for input(s): "CHOL", "HDL", "LDLCALC", "TRIG",  "CHOLHDL", "LDLDIRECT" in the last 72 hours. Thyroid Function Tests: No results for input(s): "TSH", "T4TOTAL", "FREET4", "T3FREE", "THYROIDAB" in the last 72 hours. Anemia Panel: No results for input(s): "VITAMINB12", "FOLATE", "FERRITIN", "TIBC", "IRON", "RETICCTPCT" in the last 72 hours.  Sepsis Labs: No results for input(s): "PROCALCITON", "LATICACIDVEN" in the last 168 hours.  Recent Results (from the past 240 hour(s))  Urine Culture     Status: Abnormal   Collection Time: 04/02/22  8:07 PM   Specimen: Urine, Random  Result Value Ref Range Status   Specimen Description   Final    URINE, RANDOM Performed at [facility], [address], [place], [place] [address]    Special Requests   Final    NONE Performed at [facility], [address] [place], [place] [address]    Culture >=100,000 COLONIES/mL KLEBSIELLA PNEUMONIAE (A)  Final   Report Status 04/06/2022 FINAL  Final   Organism ID, Bacteria KLEBSIELLA PNEUMONIAE (A)  Final      Susceptibility   Klebsiella pneumoniae - MIC*    AMPICILLIN >=32 RESISTANT Resistant     CEFAZOLIN <=4 SENSITIVE Sensitive     CEFEPIME <=0.12 SENSITIVE Sensitive     CEFTRIAXONE <=0.25 SENSITIVE Sensitive     CIPROFLOXACIN <=0.25 SENSITIVE Sensitive     GENTAMICIN <=1 SENSITIVE Sensitive     IMIPENEM <=0.25 SENSITIVE Sensitive     NITROFURANTOIN 128 RESISTANT Resistant     TRIMETH/SULFA <=20 SENSITIVE Sensitive     AMPICILLIN/SULBACTAM 8 SENSITIVE Sensitive     PIP/TAZO <=4 SENSITIVE Sensitive     * >=100,000 COLONIES/mL KLEBSIELLA PNEUMONIAE  MRSA Next Gen by PCR, Nasal     Status: None   Collection Time: 04/05/22 12:30 PM   Specimen: Nasal Mucosa; Nasal Swab  Result Value Ref Range Status   MRSA by PCR Next Gen NOT DETECTED NOT DETECTED Final    Comment: (NOTE) The GeneXpert MRSA Assay (FDA approved for NASAL specimens only), is one component of a comprehensive MRSA colonization surveillance program. It is not  intended to diagnose MRSA infection nor to guide or monitor treatment for MRSA infections. Test performance is not FDA approved in patients less than 41 years old. Performed at [facility], [address] [place],  [address]          Radiology Studies: DG Abd 1 View  Result Date: 04/09/2022 CLINICAL DATA:  Constipation. EXAM: ABDOMEN - 1 VIEW COMPARISON:  Pelvis radiograph 04/02/2022 FINDINGS: Prominent stool noted at the rectum. Mild gaseous distension above this point. No definite free air. Calcifications in the right abdomen likely gallstones. IMPRESSION: 1. Prominent stool at the rectum with mild gaseous distension above this point. 2. Cholelithiasis. Electronically Signed   By: San Morelle M.D.   On: 04/09/2022 12:27        Scheduled Meds:  acetaminophen  1,000 mg Oral Q8H   diclofenac Sodium  4 g Topical QID   empagliflozin  10 mg Oral Daily   enoxaparin (LOVENOX) injection  0.5 mg/kg Subcutaneous Q24H   gabapentin  300 mg Oral TID   insulin aspart  0-15 Units Subcutaneous TID WC   insulin aspart  0-5 Units Subcutaneous QHS   lidocaine  1 patch Transdermal Q24H   metFORMIN  500 mg Oral BID WC   methocarbamol  750 mg Oral QID   multivitamin with minerals  1 tablet Oral Daily   polyethylene glycol  17 g Oral Daily   pravastatin  40 mg Oral Daily   Ensure Max Protein  11 oz Oral TID   senna-docusate  1 tablet Oral BID   vitamin B-12  1,000 mcg Oral Daily   Vitamin D (Ergocalciferol)  50,000 Units Oral Q7 days   Continuous Infusions:     LOS: 1 day     Sidney Ace, MD [facility]   If 7PM-7AM, please contact night-coverage  04/09/2022, 12:34 PM

## 2022-04-09 NOTE — Progress Notes (Signed)
Physical Therapy Treatment Patient Details Name: April [name] MRN: [id number] DOB: Mar 28, 1946 Today's Date: 04/09/2022   History of Present Illness Per MD H&P: April [name] is a 76 y.o. female who lives independently with medical history significant for DM 2, HTN, PVD, chronic leukocytosis of unclear etiology, hospitalized in May 2023 following a fall resulting in a fractured humerus, with incidental finding of a left lung mass suspicious for malignancy for which patient desired no further work-up with PET scan as recommended by oncology, who presents to the ED following a mechanical fall onto her right knee as she was trying to get her clothes off.  She did not hit her head nor did she have loss of consciousness.  She denied preceding lightheadedness, palpitations or shortness of breath, headache, visual disturbance or one-sided weakness numbness or tingling.  Following the fall she only complained of right hip and knee pain and right shoulder pain.    PT Comments    Pt continues to be limited with mobility training demonstrating anxiety, confusion, extensive time to follow commands. Pt requires max A to total A+2 for bed mobility and declined OOB activity.   Recommendations for follow up therapy are one component of a multi-disciplinary discharge planning process, led by the attending physician.  Recommendations may be updated based on patient status, additional functional criteria and insurance authorization.  Follow Up Recommendations  Skilled nursing-short term rehab (<3 hours/day) Can patient physically be transported by private vehicle: No   Assistance Recommended at Discharge Frequent or constant Supervision/Assistance  Patient can return home with the following A lot of help with walking and/or transfers;A lot of help with bathing/dressing/bathroom;Assist for transportation;Assistance with cooking/housework;Help with stairs or ramp for entrance   Equipment Recommendations        Recommendations for Other Services       Precautions / Restrictions Precautions Precautions: Fall;Shoulder Type of Shoulder Precautions: Pt 2 months out humeral fracture. Per Dr. Sharlet Salina, allowed gentle strengthening and shoulder mobility. Sling no longer required, no WB restrictions Precaution Booklet Issued: No Restrictions Weight Bearing Restrictions: No     Mobility  Bed Mobility Overal bed mobility: Needs Assistance Bed Mobility: Supine to Sit, Sit to Supine     Supine to sit: HOB elevated, Max assist Sit to supine: HOB elevated, Max assist, +2 for physical assistance   General bed mobility comments: HOB maximally elevated, minimal use of RUE. Patient Response: Anxious  Transfers                   General transfer comment: Pt declined to attempt.    Ambulation/Gait               General Gait Details: pt declined stand.   Stairs             Wheelchair Mobility    Modified Rankin (Stroke Patients Only)       Balance Overall balance assessment: Needs assistance Sitting-balance support: Feet supported, Single extremity supported Sitting balance-Leahy Scale: Poor Sitting balance - Comments: poor tolerance to activity. Mod to Max A to transition and maintain upright position. Postural control: Left lateral lean, Posterior lean   Standing balance-Leahy Scale: Zero Standing balance comment: unable; pt reports dizziness, SOB, displays anxiety                            Cognition Arousal/Alertness: Suspect due to medications (pt stated she thought she felt was "going down" because of the  medicines.) Behavior During Therapy: Anxious Overall Cognitive Status: No family/caregiver present to determine baseline cognitive functioning                                 General Comments: able to follow commands with extended time, very anxious        Exercises Total Joint Exercises Ankle Circles/Pumps: Strengthening,  Both, 20 reps Quad Sets: Strengthening, Both, 20 reps Gluteal Sets: Both, 5 reps Other Exercises Other Exercises: Upper trunk activation and weight shfting activities Other Exercises: Educ re: MD updated orders re: no sling, no WB restrictions required for R UE. Role of OT, d/c recs.    General Comments        Pertinent Vitals/Pain Pain Assessment Pain Assessment: Faces Faces Pain Scale: Hurts whole lot Pain Location: R hip, back pain, L hip, genital area, R shoulder Pain Descriptors / Indicators: Aching, Discomfort, Dull, Guarding, Grimacing, Moaning Pain Intervention(s): Limited activity within patient's tolerance    Home Living                          Prior Function            PT Goals (current goals can now be found in the care plan section) Acute Rehab PT Goals Patient Stated Goal: improve mobility Time For Goal Achievement: 04/18/22 Potential to Achieve Goals: Fair Progress towards PT goals: Progressing toward goals    Frequency    Min 2X/week      PT Plan Current plan remains appropriate    Co-evaluation              AM-PAC PT "6 Clicks" Mobility   Outcome Measure  Help needed turning from your back to your side while in a flat bed without using bedrails?: A Lot Help needed moving from lying on your back to sitting on the side of a flat bed without using bedrails?: A Lot Help needed moving to and from a bed to a chair (including a wheelchair)?: Total Help needed standing up from a chair using your arms (e.g., wheelchair or bedside chair)?: Total Help needed to walk in hospital room?: Total Help needed climbing 3-5 steps with a railing? : Total 6 Click Score: 8    End of Session Equipment Utilized During Treatment: Oxygen Activity Tolerance: Other (comment);Patient limited by fatigue Patient left: in bed;with call bell/phone within reach;with bed alarm set Nurse Communication: Mobility status PT Visit Diagnosis: Muscle weakness  (generalized) (M62.81);Difficulty in walking, not elsewhere classified (R26.2)     Time: 0712-1975 PT Time Calculation (min) (ACUTE ONLY): 34 min  Charges:  $Therapeutic Exercise: 8-22 mins $Therapeutic Activity: 8-22 mins                  Bjorn Loser, PTA  04/09/22, 5:25 PM

## 2022-04-09 NOTE — Progress Notes (Signed)
   04/09/22 1709  Assess: MEWS Score  Pulse Rate (!) 115 (during therapy, cues for deep breathing.)  O2 Device Nasal Cannula  O2 Flow Rate (L/min) 2 L/min  Assess: MEWS Score  MEWS Temp 0  MEWS Systolic 0  MEWS Pulse 2  MEWS RR 0  MEWS LOC 0  MEWS Score 2  MEWS Score Color Yellow  Assess: if the MEWS score is Yellow or Red  Were vital signs taken at a resting state? Yes  Focused Assessment No change from prior assessment  Does the patient meet 2 or more of the SIRS criteria? No  Does the patient have a confirmed or suspected source of infection? No  MEWS guidelines implemented *See Row Information* Yes  Treat  MEWS Interventions Administered prn meds/treatments  Take Vital Signs  Increase Vital Sign Frequency  Yellow: Q 2hr X 2 then Q 4hr X 2, if remains yellow, continue Q 4hrs  Escalate  MEWS: Escalate Yellow: discuss with charge nurse/RN and consider discussing with provider and RRT  Notify: Charge Nurse/RN  Name of Charge Nurse/RN Notified Jo, RN  Date Charge Nurse/RN Notified 04/09/22  Time Charge Nurse/RN Notified 1730  Document  Patient Outcome Other (Comment) (Will continue to monitor)  Progress note created (see row info) Yes  Assess: SIRS CRITERIA  SIRS Temperature  0  SIRS Pulse 1  SIRS Respirations  0  SIRS WBC 0  SIRS Score Sum  1

## 2022-04-09 NOTE — Progress Notes (Signed)
Report called to [facility].

## 2022-04-10 DIAGNOSIS — W19XXXA Unspecified fall, initial encounter: Secondary | ICD-10-CM | POA: Diagnosis not present

## 2022-04-10 DIAGNOSIS — Y92009 Unspecified place in unspecified non-institutional (private) residence as the place of occurrence of the external cause: Secondary | ICD-10-CM | POA: Diagnosis not present

## 2022-04-10 DIAGNOSIS — I10 Essential (primary) hypertension: Secondary | ICD-10-CM | POA: Diagnosis not present

## 2022-04-10 LAB — CREATININE, SERUM
Creatinine, Ser: 0.42 mg/dL — ABNORMAL LOW (ref 0.44–1.00)
GFR, Estimated: >60 mL/min (ref >60)

## 2022-04-10 MED ORDER — OXYCODONE HCL 5 MG PO TABS: 5.0000 mg | ORAL_TABLET | ORAL | 0 refills | Status: AC | PRN

## 2022-04-10 MED ORDER — GABAPENTIN 300 MG PO CAPS: 300.0000 mg | ORAL_CAPSULE | Freq: Three times a day (TID) | ORAL | Status: DC

## 2022-04-10 NOTE — Progress Notes (Signed)
Report called to Amy at [facility].

## 2022-04-10 NOTE — TOC Transition Note (Signed)
Transition of Care [facility]) - CM/SW Discharge Note   Patient Details  Name: April [name] MRN: [id number] Date of Birth: 08-30-1946  Transition of Care [facility]) CM/SW Contact:  Izola Price, RN Phone Number: 04/10/2022, 10:58 AM   Clinical Narrative: 7/16: Plans finalized, bed and number to call report given to Unit RN and put on EMS forms. EMS forms printed to unit. [facility] called for transportation. Patient was informed by provider of pending discharge and transfer to [facility] care today. Simmie Davies RN CM       Final next level of care: Skilled Nursing Facility Barriers to Discharge: Barriers Resolved   Patient Goals and CMS Choice   CMS Medicare.gov Compare Post Acute Care list provided to:: Patient (Per CM note, patient chose [facility] as had been there before.) Choice offered to / list presented to : Patient  Discharge Placement              Patient [facility] bed at: [facility] Patient to be transferred to facility by: [facility] Name of family member notified: Patient per provider Patient and family notified of of transfer: 04/10/22  Discharge Plan and Services   Discharge Planning Services: CM Consult            DME Arranged: N/A DME Agency: NA       HH Arranged: NA HH Agency: NA        Social Determinants of Health ([place]) Interventions     Readmission Risk Interventions    04/04/2022    1:53 PM  Readmission Risk Prevention Plan  Post Dischage Appt Complete  Medication Screening Complete  Transportation Screening Complete

## 2022-04-10 NOTE — Discharge Summary (Signed)
Physician Discharge Summary  April [name] EVO:[id number] DOB: [birth year] DOA: 04/02/2022  PCP: Albina Billet, MD  Admit date: 04/02/2022 Discharge date: 04/10/2022  Admitted From: Home Disposition:  SNF  Recommendations for Outpatient Follow-up:  Follow up with PCP in 1-2 weeks Outpatient palliative care referral  Home Health:No  Equipment/Devices: Oxygen 2 L via nasal cannula  Discharge Condition: Stable CODE STATUS: Full Diet recommendation: Regular  Brief/Interim Summary: 76 y.o. female who lives independently with medical history significant for DM 2, HTN, PVD, chronic leukocytosis of unclear etiology, hospitalized in May 2023 following a fall resulting in a fractured humerus, with incidental finding of a left lung mass suspicious for malignancy for which patient desired no further work-up with PET scan as recommended by oncology, who presents to the ED following a mechanical fall onto her right knee as she was trying to get her clothes off.  She did not hit her head nor did she have loss of consciousness.  She denied preceding lightheadedness, palpitations or shortness of breath, headache, visual disturbance or one-sided weakness numbness or tingling.  Following the fall she only complained of right hip and knee pain and right shoulder pain.   Insurance authorization obtained for [employer] on 7/14.  Pain reasonably well controlled without the use of IV narcotics.  Patient is opioid tolerant and is still in some pain but improved from prior and stable.  Multimodal pain regimen recommended on discharge.  DC to skilled nursing facility    Discharge Diagnoses:  Principal Problem:   Fall at home, initial encounter Active Problems:   Hypoxia   Frequent falls   Leukocytosis   Mass of lower lobe of left lung on CT chest 01/30/22   Type 2 diabetes mellitus ([place])   PAD (peripheral artery disease) (HCC)   Essential hypertension   Vitamin D deficiency   UTI (urinary tract  infection)  Frequent falls Right knee pain secondary to fall History of fall in May 2023 resulting in fracture of the humerus Patient lives alone Patient may not be safe to return to home alone [facility] has been unable to reach her contacts Plan: I spoke to patient's emergency contact Jewell at bedside on 7/15.  Almyra Free is aware of discharge plan and in agreement.  IV narcotics have been weaned off.  Patient is opioid tolerant and is chronically on oxycodone.  3-day course has been prescribed.  Further refills to be considered by skilled nursing facility provider.  Discharge to [facility] in stable condition.     Mass of lower lobe of left lung on CT chest 01/30/22 Hypoxia  Likely secondary to lung mass suspicious for malignancy.  O2 sats in the mid 80s off oxygen in the ED Supplemental oxygen to keep sats over 92% Patient desires no further work-up of the lung mass Was seen by oncology on 5/7 and was not interested in recommended PET scan 7/11 palliative following. She is full code. She has poor px overall.  Palliative to follow up Outpatient palliative care referral   Leukocytosis likley from UTI UTI Ucx >100,00 Klebsiella pneumonia Plan: IV Rocephin, 3-day course Completed 3-day course on 7/13   Vitamin D deficiency Level 16 Replacement ordered   Essential hypertension Stable. At times on low side   PAD (peripheral artery disease) (HCC) Continue  pravastatin   Type 2 diabetes mellitus ([place]) Okay to continue metformin and Jardiance for now  Discharge Instructions  Discharge Instructions     Diet - low sodium heart healthy  Complete by: As directed    Increase activity slowly   Complete by: As directed       Allergies as of 04/10/2022       Reactions   Prednisone Anaphylaxis   Other         Medication List     STOP taking these medications    pregabalin 75 MG capsule Commonly known as: LYRICA   traMADol 50 MG tablet Commonly known as: ULTRAM        TAKE these medications    acetaminophen 500 MG tablet Commonly known as: TYLENOL Take 1,000 mg by mouth every 8 (eight) hours as needed for moderate pain. Reported on 12/16/2015   diclofenac Sodium 1 % Gel Commonly known as: VOLTAREN Apply 4 g topically 4 (four) times daily.   docusate sodium 100 MG capsule Commonly known as: COLACE Take 1 capsule (100 mg total) by mouth 2 (two) times daily.   empagliflozin 10 MG Tabs tablet Commonly known as: JARDIANCE Take by mouth daily.   gabapentin 300 MG capsule Commonly known as: NEURONTIN Take 1 capsule (300 mg total) by mouth 3 (three) times daily.   metFORMIN 500 MG tablet Commonly known as: GLUCOPHAGE Take 500 mg by mouth 2 (two) times daily.   metoprolol tartrate 25 MG tablet Commonly known as: LOPRESSOR Take 1 tablet (25 mg total) by mouth 2 (two) times daily.   oxyCODONE 5 MG immediate release tablet Commonly known as: Oxy IR/ROXICODONE Take 1 tablet (5 mg total) by mouth every 4 (four) hours as needed for up to 3 days. For SNF use only.  Refills to be considered by provider at SNF What changed:  when to take this additional instructions Another medication with the same name was removed. Continue taking this medication, and follow the directions you see here.   polyethylene glycol 17 g packet Commonly known as: MIRALAX / GLYCOLAX Take 17 g by mouth daily. What changed:  when to take this reasons to take this   pravastatin 40 MG tablet Commonly known as: PRAVACHOL Take 40 mg by mouth daily.        Contact information for after-discharge care     [facility] .   Service: Skilled Nursing Contact information: [place] [phone]                    Allergies  Allergen Reactions   Prednisone Anaphylaxis   Other     Consultations: Palliative care Oncology   Procedures/Studies: DG Abd 1 View  Result  Date: 04/09/2022 CLINICAL DATA:  Constipation. EXAM: ABDOMEN - 1 VIEW COMPARISON:  Pelvis radiograph 04/02/2022 FINDINGS: Prominent stool noted at the rectum. Mild gaseous distension above this point. No definite free air. Calcifications in the right abdomen likely gallstones. IMPRESSION: 1. Prominent stool at the rectum with mild gaseous distension above this point. 2. Cholelithiasis. Electronically Signed   By: San Morelle M.D.   On: 04/09/2022 12:27   DG Chest Portable 1 View  Result Date: 04/02/2022 CLINICAL DATA:  Recent fall with right-sided pain, initial encounter EXAM: PORTABLE CHEST 1 VIEW COMPARISON:  01/28/2022 FINDINGS: Cardiac shadow is within normal limits. Aortic calcifications are again seen. Increased vascular congestion and mild edema is noted. No focal confluent infiltrate is seen. Old rib fractures are noted bilaterally with healing. IMPRESSION: No acute abnormality noted. Electronically Signed   By: Inez Catalina M.D.   On: 04/02/2022 21:50  DG Knee 2 Views Right  Result Date: 04/02/2022 CLINICAL DATA:  Right leg pain following fall, initial encounter EXAM: RIGHT KNEE - 2 VIEW COMPARISON:  None Available. FINDINGS: Postsurgical changes in the distal femur are noted. No acute fracture is noted. IMPRESSION: No acute abnormality noted. Electronically Signed   By: Inez Catalina M.D.   On: 04/02/2022 21:49   DG Hip Unilat W or Wo Pelvis 2-3 Views Right  Result Date: 04/02/2022 CLINICAL DATA:  Right-sided hip pain following fall several hours ago, initial encounter EXAM: DG HIP (WITH OR WITHOUT PELVIS) 2-3V RIGHT COMPARISON:  02/07/2019 FINDINGS: Pelvic ring is intact. Postsurgical changes in the proximal right femur are noted. Degenerative changes of the right hip joint are noted. No acute fracture or dislocation is seen. No soft tissue changes are noted. IMPRESSION: Degenerative change in the right hip joint. No acute abnormality is noted. Electronically Signed   By: Inez Catalina  M.D.   On: 04/02/2022 21:48      Subjective: Seen and examined on the day of discharge.  Stable no distress.  Stable for discharge to skilled nursing facility.  Discharge Exam: Vitals:   04/10/22 0631 04/10/22 0824  BP: (!) 152/80 122/65  Pulse: (!) 115 (!) 108  Resp: (!) 21 20  Temp: 98.1 F (36.7 C) 97.8 F (36.6 C)  SpO2: 92% 93%   Vitals:   04/09/22 1727 04/09/22 2039 04/10/22 0631 04/10/22 0824  BP: 127/70 127/83 (!) 152/80 122/65  Pulse: (!) 119 (!) 114 (!) 115 (!) 108  Resp: 20 18 (!) 21 20  Temp: 98.8 F (37.1 C) 98.2 F (36.8 C) 98.1 F (36.7 C) 97.8 F (36.6 C)  TempSrc: Oral Oral    SpO2: 92% 95% 92% 93%  Weight:      Height:        General: Pt is alert, awake, not in acute distress Cardiovascular: RRR, S1/S2 +, no rubs, no gallops Respiratory: CTA bilaterally, no wheezing, no rhonchi Abdominal: Soft, NT, ND, bowel sounds + Extremities: no edema, no cyanosis    The results of significant diagnostics from this hospitalization (including imaging, microbiology, ancillary and laboratory) are listed below for reference.     Microbiology: Recent Results (from the past 240 hour(s))  Urine Culture     Status: Abnormal   Collection Time: 04/02/22  8:07 PM   Specimen: Urine, Random  Result Value Ref Range Status   Specimen Description   Final    URINE, RANDOM Performed at [facility], [address] [place], [place] [address]    Special Requests   Final    NONE Performed at [facility], [address] [place], [place] [address]    Culture >=100,000 COLONIES/mL KLEBSIELLA PNEUMONIAE (A)  Final   Report Status 04/06/2022 FINAL  Final   Organism ID, Bacteria KLEBSIELLA PNEUMONIAE (A)  Final      Susceptibility   Klebsiella pneumoniae - MIC*    AMPICILLIN >=32 RESISTANT Resistant     CEFAZOLIN <=4 SENSITIVE Sensitive     CEFEPIME <=0.12 SENSITIVE Sensitive     CEFTRIAXONE <=0.25 SENSITIVE Sensitive     CIPROFLOXACIN <=0.25  SENSITIVE Sensitive     GENTAMICIN <=1 SENSITIVE Sensitive     IMIPENEM <=0.25 SENSITIVE Sensitive     NITROFURANTOIN 128 RESISTANT Resistant     TRIMETH/SULFA <=20 SENSITIVE Sensitive     AMPICILLIN/SULBACTAM 8 SENSITIVE Sensitive     PIP/TAZO <=4 SENSITIVE Sensitive     * >=100,000 COLONIES/mL KLEBSIELLA PNEUMONIAE  MRSA Next Gen by PCR, Nasal     Status: None   Collection Time: 04/05/22 12:30 PM   Specimen: Nasal Mucosa; Nasal Swab  Result Value Ref Range Status   MRSA by PCR Next Gen NOT DETECTED NOT DETECTED Final    Comment: (NOTE) The GeneXpert MRSA Assay (FDA approved for NASAL specimens only), is one component of a comprehensive MRSA colonization surveillance program. It is not intended to diagnose MRSA infection nor to guide or monitor treatment for MRSA infections. Test performance is not FDA approved in patients less than 82 years old. Performed at [facility], [address] [place], [place] [address]      Labs: BNP (last 3 results) Recent Labs    04/02/22 2007  BNP 45.0   Basic Metabolic Panel: Recent Labs  Lab 04/05/22 0439 04/06/22 0819 04/08/22 0540 04/10/22 0547  NA  --  136 139  --   K  --  3.1* 5.0  --   CL  --  98 100  --   CO2  --  29 32  --   GLUCOSE  --  140* 118*  --   BUN  --  16 13  --   CREATININE  --  0.49 0.42* 0.42*  CALCIUM  --  9.3 9.7  --   MG  --   --  2.0  --   PHOS 3.9  --   --   --    Liver Function Tests: Recent Labs  Lab 04/08/22 0540  AST 44*  ALT 20  ALKPHOS 574*  BILITOT 0.5  PROT 6.6  ALBUMIN 2.2*   No results for input(s): "LIPASE", "AMYLASE" in the last 168 hours. No results for input(s): "AMMONIA" in the last 168 hours. CBC: Recent Labs  Lab 04/05/22 0439 04/06/22 0819  WBC 19.6* 19.5*  NEUTROABS  --  16.5*  HGB 13.9 14.3  HCT 44.2 44.7  MCV 86.2 85.1  PLT 256 287   Cardiac Enzymes: No results for input(s): "CKTOTAL", "CKMB", "CKMBINDEX", "TROPONINI" in the last 168  hours. BNP: Invalid input(s): "POCBNP" CBG: Recent Labs  Lab 04/08/22 2212 04/09/22 0935 04/09/22 1118 04/09/22 1726 04/09/22 2040  GLUCAP 135* 128* 176* 133* 108*   D-Dimer No results for input(s): "DDIMER" in the last 72 hours. Hgb A1c No results for input(s): "HGBA1C" in the last 72 hours. Lipid Profile No results for input(s): "CHOL", "HDL", "LDLCALC", "TRIG", "CHOLHDL", "LDLDIRECT" in the last 72 hours. Thyroid function studies No results for input(s): "TSH", "T4TOTAL", "T3FREE", "THYROIDAB" in the last 72 hours.  Invalid input(s): "FREET3" Anemia work up No results for input(s): "VITAMINB12", "FOLATE", "FERRITIN", "TIBC", "IRON", "RETICCTPCT" in the last 72 hours. Urinalysis    Component Value Date/Time   COLORURINE YELLOW 04/02/2022 2007   APPEARANCEUR TURBID (A) 04/02/2022 2007   LABSPEC 1.020 04/02/2022 2007   PHURINE 6.5 04/02/2022 2007   GLUCOSEU 500 (A) 04/02/2022 2007   HGBUR LARGE (A) 04/02/2022 2007   BILIRUBINUR SMALL (A) 04/02/2022 2007   KETONESUR 15 (A) 04/02/2022 2007   PROTEINUR 100 (A) 04/02/2022 2007   NITRITE NEGATIVE 04/02/2022 2007   LEUKOCYTESUR SMALL (A) 04/02/2022 2007   Sepsis Labs Recent Labs  Lab 04/05/22 0439 04/06/22 0819  WBC 19.6* 19.5*   Microbiology Recent Results (from the past 240 hour(s))  Urine Culture     Status: Abnormal   Collection Time: 04/02/22  8:07 PM   Specimen: Urine, Random  Result Value Ref Range Status   Specimen Description  Final    URINE, RANDOM Performed at [facility], [address] [place], [place] [address]    Special Requests   Final    NONE Performed at [facility], [address] [place], [place] [address]    Culture >=100,000 COLONIES/mL KLEBSIELLA PNEUMONIAE (A)  Final   Report Status 04/06/2022 FINAL  Final   Organism ID, Bacteria KLEBSIELLA PNEUMONIAE (A)  Final      Susceptibility   Klebsiella pneumoniae - MIC*    AMPICILLIN >=32 RESISTANT Resistant      CEFAZOLIN <=4 SENSITIVE Sensitive     CEFEPIME <=0.12 SENSITIVE Sensitive     CEFTRIAXONE <=0.25 SENSITIVE Sensitive     CIPROFLOXACIN <=0.25 SENSITIVE Sensitive     GENTAMICIN <=1 SENSITIVE Sensitive     IMIPENEM <=0.25 SENSITIVE Sensitive     NITROFURANTOIN 128 RESISTANT Resistant     TRIMETH/SULFA <=20 SENSITIVE Sensitive     AMPICILLIN/SULBACTAM 8 SENSITIVE Sensitive     PIP/TAZO <=4 SENSITIVE Sensitive     * >=100,000 COLONIES/mL KLEBSIELLA PNEUMONIAE  MRSA Next Gen by PCR, Nasal     Status: None   Collection Time: 04/05/22 12:30 PM   Specimen: Nasal Mucosa; Nasal Swab  Result Value Ref Range Status   MRSA by PCR Next Gen NOT DETECTED NOT DETECTED Final    Comment: (NOTE) The GeneXpert MRSA Assay (FDA approved for NASAL specimens only), is one component of a comprehensive MRSA colonization surveillance program. It is not intended to diagnose MRSA infection nor to guide or monitor treatment for MRSA infections. Test performance is not FDA approved in patients less than 38 years old. Performed at [facility], [address], [place], [place] [address]      Time coordinating discharge: Over 30 minutes  SIGNED:   Sidney Ace, MD  [facility] 04/10/2022, 9:16 AM Pager   If 7PM-7AM, please contact night-coverage

## 2022-04-10 NOTE — TOC Progression Note (Addendum)
Transition of Care [facility]) - Progression Note    Patient Details  Name: DESTA BUJAK MRN: [id number] Date of Birth: 10/26/1945  Transition of Care [facility]) CM/SW Contact  Izola Price, RN Phone Number: 04/10/2022, 10:08 AM  Clinical Narrative: 7/16: Patient is ready for discharge and transfer to [facility] care. DC Summary in and inboxed via the Hub. Confirmed with AC at [facility] and waiting on room number and where to call report. Updated Unit RN and provider. Will arrange transport if needed when paperwork/room assignment complete. Simmie Davies RN CM     nH Josem Kaufmann ID #[id number] 04/08/2022-04/12/2022 Plan auth ID #[id number] Next review 04/12/22   Expected Discharge Plan: [place] ([facility]) Barriers to Discharge: Continued Medical Work up  Expected Discharge Plan and Services Expected Discharge Plan: [place] ([facility])   Discharge Planning Services: CM Consult   Living arrangements for the past 2 months: Single Family Home Expected Discharge Date: 04/10/22                           [facility] Agency: [facility] [place] Determinants of Health (SDOH) Interventions    Readmission Risk Interventions    04/04/2022    1:53 PM  Readmission Risk Prevention Plan  Post Dischage Appt Complete  Medication Screening Complete  Transportation Screening Complete

## 2022-04-10 NOTE — Progress Notes (Signed)
Pt d/c to [facility] via EMS. All belongings sent with pt. IV removed intact. Pt placed in transfer packet.

## 2022-04-11 NOTE — Progress Notes (Signed)
Patient already discharged from [facility], Manufacturing engineer [facility]) Hospital liaison note:  Notified by Vinie Sill NP of patient request for [place] at home after discharge.   Please call with any hospice or outpatient palliative care related questions.   Thank you for the opportunity to participate in this patient's care.    [place] [facility] Liaison [phone]

## 2022-04-15 ENCOUNTER — Inpatient Hospital Stay
Admission: EM | Admit: 2022-04-15 | Discharge: 2022-04-24 | DRG: 436 | Disposition: A | Discharge status: Hospice | Payer: Medicare HMO | Source: Skilled Nursing Facility | Attending: Internal Medicine | Admitting: Internal Medicine

## 2022-04-15 ENCOUNTER — Emergency Department: Payer: Medicare HMO

## 2022-04-15 ENCOUNTER — Other Ambulatory Visit: Payer: Self-pay

## 2022-04-15 ENCOUNTER — Encounter: Payer: Self-pay | Admitting: Nurse Practitioner

## 2022-04-15 ENCOUNTER — Non-Acute Institutional Stay: Payer: Medicare HMO | Admitting: Nurse Practitioner

## 2022-04-15 VITALS — BP 160/87 | HR 94 | Temp 98.2°F | Resp 26 | Wt 201.4 lb

## 2022-04-15 DIAGNOSIS — I10 Essential (primary) hypertension: Secondary | ICD-10-CM | POA: Diagnosis present

## 2022-04-15 DIAGNOSIS — I739 Peripheral vascular disease, unspecified: Secondary | ICD-10-CM | POA: Diagnosis present

## 2022-04-15 DIAGNOSIS — Z888 Allergy status to other drugs, medicaments and biological substances status: Secondary | ICD-10-CM

## 2022-04-15 DIAGNOSIS — I480 Paroxysmal atrial fibrillation: Secondary | ICD-10-CM | POA: Diagnosis present

## 2022-04-15 DIAGNOSIS — Z515 Encounter for palliative care: Secondary | ICD-10-CM

## 2022-04-15 DIAGNOSIS — J9611 Chronic respiratory failure with hypoxia: Secondary | ICD-10-CM

## 2022-04-15 DIAGNOSIS — Z8619 Personal history of other infectious and parasitic diseases: Secondary | ICD-10-CM

## 2022-04-15 DIAGNOSIS — I252 Old myocardial infarction: Secondary | ICD-10-CM

## 2022-04-15 DIAGNOSIS — C7802 Secondary malignant neoplasm of left lung: Secondary | ICD-10-CM | POA: Diagnosis present

## 2022-04-15 DIAGNOSIS — E876 Hypokalemia: Secondary | ICD-10-CM

## 2022-04-15 DIAGNOSIS — E1151 Type 2 diabetes mellitus with diabetic peripheral angiopathy without gangrene: Secondary | ICD-10-CM | POA: Diagnosis present

## 2022-04-15 DIAGNOSIS — K6389 Other specified diseases of intestine: Secondary | ICD-10-CM

## 2022-04-15 DIAGNOSIS — Z79899 Other long term (current) drug therapy: Secondary | ICD-10-CM

## 2022-04-15 DIAGNOSIS — G8929 Other chronic pain: Secondary | ICD-10-CM | POA: Diagnosis present

## 2022-04-15 DIAGNOSIS — M272 Inflammatory conditions of jaws: Secondary | ICD-10-CM | POA: Diagnosis present

## 2022-04-15 DIAGNOSIS — C7951 Secondary malignant neoplasm of bone: Secondary | ICD-10-CM | POA: Diagnosis present

## 2022-04-15 DIAGNOSIS — Z833 Family history of diabetes mellitus: Secondary | ICD-10-CM

## 2022-04-15 DIAGNOSIS — K5900 Constipation, unspecified: Secondary | ICD-10-CM

## 2022-04-15 DIAGNOSIS — D72829 Elevated white blood cell count, unspecified: Secondary | ICD-10-CM | POA: Diagnosis present

## 2022-04-15 DIAGNOSIS — C787 Secondary malignant neoplasm of liver and intrahepatic bile duct: Principal | ICD-10-CM | POA: Diagnosis present

## 2022-04-15 DIAGNOSIS — R110 Nausea: Secondary | ICD-10-CM

## 2022-04-15 DIAGNOSIS — E860 Dehydration: Secondary | ICD-10-CM

## 2022-04-15 DIAGNOSIS — Z66 Do not resuscitate: Secondary | ICD-10-CM | POA: Diagnosis present

## 2022-04-15 DIAGNOSIS — C189 Malignant neoplasm of colon, unspecified: Secondary | ICD-10-CM

## 2022-04-15 DIAGNOSIS — E119 Type 2 diabetes mellitus without complications: Secondary | ICD-10-CM

## 2022-04-15 DIAGNOSIS — Z87440 Personal history of urinary (tract) infections: Secondary | ICD-10-CM

## 2022-04-15 DIAGNOSIS — Z9181 History of falling: Secondary | ICD-10-CM

## 2022-04-15 DIAGNOSIS — Z8249 Family history of ischemic heart disease and other diseases of the circulatory system: Secondary | ICD-10-CM

## 2022-04-15 DIAGNOSIS — M8608 Acute hematogenous osteomyelitis, other sites: Secondary | ICD-10-CM

## 2022-04-15 DIAGNOSIS — Z7984 Long term (current) use of oral hypoglycemic drugs: Secondary | ICD-10-CM

## 2022-04-15 DIAGNOSIS — C187 Malignant neoplasm of sigmoid colon: Secondary | ICD-10-CM | POA: Diagnosis present

## 2022-04-15 DIAGNOSIS — R54 Age-related physical debility: Secondary | ICD-10-CM

## 2022-04-15 DIAGNOSIS — R0902 Hypoxemia: Secondary | ICD-10-CM | POA: Diagnosis present

## 2022-04-15 DIAGNOSIS — Z7189 Other specified counseling: Secondary | ICD-10-CM

## 2022-04-15 DIAGNOSIS — C799 Secondary malignant neoplasm of unspecified site: Secondary | ICD-10-CM

## 2022-04-15 LAB — COMPREHENSIVE METABOLIC PANEL
ALT: 15 U/L (ref 0–44)
AST: 48 U/L — ABNORMAL HIGH (ref 15–41)
Albumin: 2.4 g/dL — ABNORMAL LOW (ref 3.5–5.0)
Alkaline Phosphatase: 561 U/L — ABNORMAL HIGH (ref 38–126)
Anion gap: 9 (ref 5–15)
BUN: 14 mg/dL (ref 8–23)
CO2: 27 mmol/L (ref 22–32)
Calcium: 9 mg/dL (ref 8.9–10.3)
Chloride: 96 mmol/L — ABNORMAL LOW (ref 98–111)
Creatinine, Ser: 0.41 mg/dL — ABNORMAL LOW (ref 0.44–1.00)
GFR, Estimated: >60 mL/min (ref >60)
Glucose, Bld: 98 mg/dL (ref 70–99)
Potassium: 3.5 mmol/L (ref 3.5–5.1)
Sodium: 132 mmol/L — ABNORMAL LOW (ref 135–145)
Total Bilirubin: 1.2 mg/dL (ref 0.3–1.2)
Total Protein: 6.9 g/dL (ref 6.5–8.1)

## 2022-04-15 LAB — CBC
HCT: 46.2 % — ABNORMAL HIGH (ref 36.0–46.0)
Hemoglobin: 14.4 g/dL (ref 12.0–15.0)
MCH: 26.6 pg (ref 26.0–34.0)
MCHC: 31.2 g/dL (ref 30.0–36.0)
MCV: 85.4 fL (ref 80.0–100.0)
Platelets: 440 10*3/uL — ABNORMAL HIGH (ref 150–400)
RBC: 5.41 MIL/uL — ABNORMAL HIGH (ref 3.87–5.11)
RDW: 16.3 % — ABNORMAL HIGH (ref 11.5–15.5)
WBC: 27.6 10*3/uL — ABNORMAL HIGH (ref 4.0–10.5)
nRBC: 0 % (ref 0.0–0.2)

## 2022-04-15 LAB — LACTIC ACID, PLASMA: Lactic Acid, Venous: 1.3 mmol/L (ref 0.5–1.9)

## 2022-04-15 LAB — LIPASE, BLOOD: Lipase: 24 U/L (ref 11–51)

## 2022-04-15 MED ORDER — ONDANSETRON HCL 4 MG/2ML IJ SOLN
4.0000 mg | Freq: Four times a day (QID) | INTRAMUSCULAR | Status: DC | PRN
  Administered 2022-04-16: 4 mg via INTRAVENOUS
  Filled 2022-04-15: qty 2

## 2022-04-15 MED ORDER — ACETAMINOPHEN 650 MG RE SUPP: 650.0000 mg | Freq: Four times a day (QID) | RECTAL | Status: DC | PRN

## 2022-04-15 MED ORDER — PRAVASTATIN SODIUM 20 MG PO TABS
40.0000 mg | ORAL_TABLET | Freq: Every day | ORAL | Status: DC
  Administered 2022-04-16 – 2022-04-23 (×6): 40 mg via ORAL
  Filled 2022-04-15 (×7): qty 2

## 2022-04-15 MED ORDER — MORPHINE SULFATE (PF) 2 MG/ML IV SOLN
2.0000 mg | INTRAVENOUS | Status: DC | PRN
  Administered 2022-04-16 – 2022-04-24 (×21): 2 mg via INTRAVENOUS
  Filled 2022-04-15 (×23): qty 1

## 2022-04-15 MED ORDER — POLYETHYLENE GLYCOL 3350 17 G PO PACK
17.0000 g | PACK | Freq: Every day | ORAL | Status: DC
  Administered 2022-04-16: 17 g via ORAL
  Filled 2022-04-15: qty 1

## 2022-04-15 MED ORDER — SODIUM CHLORIDE 0.9 % IV SOLN
3.0000 g | Freq: Once | INTRAVENOUS | Status: AC
  Administered 2022-04-15: 3 g via INTRAVENOUS
  Filled 2022-04-15: qty 8

## 2022-04-15 MED ORDER — ENOXAPARIN SODIUM 60 MG/0.6ML IJ SOSY
0.5000 mg/kg | PREFILLED_SYRINGE | INTRAMUSCULAR | Status: DC
  Administered 2022-04-16 – 2022-04-23 (×8): 45 mg via SUBCUTANEOUS
  Filled 2022-04-15 (×9): qty 0.6

## 2022-04-15 MED ORDER — DOCUSATE SODIUM 100 MG PO CAPS
100.0000 mg | ORAL_CAPSULE | Freq: Two times a day (BID) | ORAL | Status: DC
  Administered 2022-04-16 – 2022-04-24 (×16): 100 mg via ORAL
  Filled 2022-04-15 (×17): qty 1

## 2022-04-15 MED ORDER — METOPROLOL TARTRATE 25 MG PO TABS
25.0000 mg | ORAL_TABLET | Freq: Two times a day (BID) | ORAL | Status: DC
  Administered 2022-04-16 – 2022-04-23 (×17): 25 mg via ORAL
  Filled 2022-04-15 (×17): qty 1

## 2022-04-15 MED ORDER — IOHEXOL 300 MG/ML  SOLN
100.0000 mL | Freq: Once | INTRAMUSCULAR | Status: AC | PRN
  Administered 2022-04-15: 100 mL via INTRAVENOUS

## 2022-04-15 MED ORDER — METFORMIN HCL 500 MG PO TABS
500.0000 mg | ORAL_TABLET | Freq: Two times a day (BID) | ORAL | Status: DC
  Administered 2022-04-17 – 2022-04-23 (×10): 500 mg via ORAL
  Filled 2022-04-15 (×12): qty 1

## 2022-04-15 MED ORDER — EMPAGLIFLOZIN 10 MG PO TABS
10.0000 mg | ORAL_TABLET | Freq: Every day | ORAL | Status: DC
  Administered 2022-04-17 – 2022-04-23 (×5): 10 mg via ORAL
  Filled 2022-04-15 (×8): qty 1

## 2022-04-15 MED ORDER — ONDANSETRON HCL 4 MG/2ML IJ SOLN
4.0000 mg | Freq: Once | INTRAMUSCULAR | Status: AC
  Administered 2022-04-15: 4 mg via INTRAVENOUS
  Filled 2022-04-15: qty 2

## 2022-04-15 MED ORDER — ACETAMINOPHEN 325 MG PO TABS
650.0000 mg | ORAL_TABLET | Freq: Four times a day (QID) | ORAL | Status: DC | PRN
  Administered 2022-04-16 – 2022-04-18 (×3): 650 mg via ORAL
  Filled 2022-04-15 (×3): qty 2

## 2022-04-15 MED ORDER — INSULIN ASPART 100 UNIT/ML IJ SOLN: 0.0000 [IU] | Freq: Every day | INTRAMUSCULAR | Status: DC

## 2022-04-15 MED ORDER — ONDANSETRON HCL 4 MG PO TABS: 4.0000 mg | ORAL_TABLET | Freq: Four times a day (QID) | ORAL | Status: DC | PRN

## 2022-04-15 MED ORDER — SODIUM CHLORIDE 0.9 % IV SOLN
3.0000 g | Freq: Four times a day (QID) | INTRAVENOUS | Status: DC
  Administered 2022-04-16 – 2022-04-23 (×29): 3 g via INTRAVENOUS
  Filled 2022-04-15: qty 3
  Filled 2022-04-15: qty 8
  Filled 2022-04-15: qty 3
  Filled 2022-04-15 (×4): qty 8
  Filled 2022-04-15: qty 3
  Filled 2022-04-15 (×2): qty 8
  Filled 2022-04-15: qty 3
  Filled 2022-04-15: qty 8
  Filled 2022-04-15: qty 3
  Filled 2022-04-15 (×4): qty 8
  Filled 2022-04-15: qty 3
  Filled 2022-04-15: qty 8
  Filled 2022-04-15 (×3): qty 3
  Filled 2022-04-15 (×5): qty 8
  Filled 2022-04-15: qty 3
  Filled 2022-04-15: qty 8
  Filled 2022-04-15: qty 3
  Filled 2022-04-15 (×2): qty 8

## 2022-04-15 MED ORDER — GABAPENTIN 300 MG PO CAPS
300.0000 mg | ORAL_CAPSULE | Freq: Three times a day (TID) | ORAL | Status: DC
  Administered 2022-04-16 – 2022-04-24 (×24): 300 mg via ORAL
  Filled 2022-04-15 (×25): qty 1

## 2022-04-15 MED ORDER — MORPHINE SULFATE (PF) 4 MG/ML IV SOLN
4.0000 mg | Freq: Once | INTRAVENOUS | Status: AC
  Administered 2022-04-15: 4 mg via INTRAVENOUS
  Filled 2022-04-15: qty 1

## 2022-04-15 MED ORDER — INSULIN ASPART 100 UNIT/ML IJ SOLN
0.0000 [IU] | Freq: Three times a day (TID) | INTRAMUSCULAR | Status: DC
  Administered 2022-04-16 – 2022-04-17 (×2): 2 [IU] via SUBCUTANEOUS
  Administered 2022-04-17 – 2022-04-18 (×2): 3 [IU] via SUBCUTANEOUS
  Administered 2022-04-19: 2 [IU] via SUBCUTANEOUS
  Administered 2022-04-20: 3 [IU] via SUBCUTANEOUS
  Administered 2022-04-21: 2 [IU] via SUBCUTANEOUS
  Administered 2022-04-21: 3 [IU] via SUBCUTANEOUS
  Administered 2022-04-22 – 2022-04-23 (×2): 2 [IU] via SUBCUTANEOUS
  Filled 2022-04-15 (×10): qty 1

## 2022-04-15 NOTE — ED Notes (Signed)
Patients states came from the facility d/t having abdominal pain. Talkative an alert. Sitting up in recliner. Patients states she was told they think she has a UTI. 02 at 3l Sat hanging around 90%. PA in room.

## 2022-04-15 NOTE — Assessment & Plan Note (Addendum)
Likely fungating mass/malignancy Patient has right-sided jaw pain and swelling with leukocytosis of 27,000 and abnormal CT findings CT shows: Expanded right mandibular body with surrounding soft tissue and multifocal cortical destruction, likely indicating active osteomyelitis with an intra osseous necrotic or purulence collection Completed antibiotics

## 2022-04-15 NOTE — H&P (Signed)
History and Physical    Patient: April [name] WVP:[id number] DOB: 1946-01-13 DOA: 04/15/2022 DOS: the patient was seen and examined on 04/15/2022 PCP: Albina Billet, MD  Patient coming from: SNF  Chief Complaint:  Chief Complaint  Patient presents with   Abdominal Pain   Constipation    HPI: April [name] is a 76 y.o. female with medical history significant for Type II DM, HTN, PVD, recently hospitalized from 7/8 to 7/16 for frequent falls and discharged to SNF, who at that time had chronic leukocytosis of unknown etiology but found to have Klebsiella UTI for which she was treated and who at the same time had a known lung mass seen on CT 01/30/2022 for which she declined treatment or further investigation, evaluated by palliative care prior to discharge, who now returns to the ED with a complaint of abdominal pain, constipation, and pain all over, including pain on her right jaw and ' something going on with her tooth'.  She states her last bowel movement was 2 to 3 weeks prior and she has pain with urination.  She denies fever or chills or cough or shortness of breath. ED course and data review: In the ED intermittently tachycardic to 101.  O2 sat intermittently 90-94 for which she was placed on nasal cannula 2 to 3 L.  Vitals otherwise unremarkable Labs notable for WBC of 27,000 with lactic acid 1.3, up from an high baseline of 19-21,000 during her hospitalization a couple weeks prior.  Platelets 440 and alk phos 561. Imaging as follows:  IMPRESSION: Maxillofacial CT Expanded right mandibular body with surrounding soft tissue and multifocal cortical destruction, likely indicating active osteomyelitis with an intra osseous necrotic or purulence collection. Clinical follow-up after resolution of symptoms is recommended to exclude a superimposed neoplastic process.    IMPRESSION: CT abdomen and pelvis There is 5.7 cm mass lesion in sigmoid colon suggesting possible primary malignant  neoplasm.  There is no evidence of intestinal obstruction or pneumoperitoneum. There is no hydronephrosis.  There are multiple nodules of varying sizes in the visualized lower lung fields consistent with pulmonary metastatic disease. Patchy infiltrates in the lower lung fields may suggest atelectasis/pneumonia. Small bilateral pleural effusions. There are multiple space-occupying lesions of varying sizes in the liver measuring up to 7.5 cm in size suggesting hepatic metastatic disease.  There are few nodular densities in both adrenals suggesting possible metastatic disease.  There is inhomogeneous attenuation in the bony structures suggesting possible skeletal metastatic disease. There is a lytic lesion in the posterior medial aspect of left iliac crest consistent with skeletal metastatic disease.  Other findings as described in the body of the report.  The emergency provider spoke with ENT specialist, Dr. Tami Ribas who advised that infection on mandible is possibly related to underlying malignancy so any surgical intervention would likely worsen outcome and thus recommended antibiotic therapy only.  Recommended oral Augmentin, according to discussion with ED provider  Patient was started on Unasyn and hospitalist consulted for admission for pain control, management of constipation and initiation of antibiotic treatment  Past Medical History:  Diagnosis Date   Acute renal failure (ARF) ([place])    Difficult intubation    Hypertension    NSTEMI (non-ST elevated myocardial infarction) ([place])    Paroxysmal atrial fibrillation ([place])    Renal disorder    Sepsis [facility]) January 2017   [facility]   Type 2 diabetes mellitus [facility])    Past Surgical History:  Procedure Laterality Date   broken hip  2019   CYSTOSCOPY WITH STENT PLACEMENT Right 11/03/2015   Procedure: CYSTOSCOPY WITH STENT PLACEMENT;  Surgeon: Hollice Espy, MD;  Location: [facility] ORS;  Service: Urology;  Laterality: Right;   FRACTURE  SURGERY Right    [place]   INTRAMEDULLARY (IM) NAIL INTERTROCHANTERIC Right 02/07/2019   Procedure: INTRAMEDULLARY (IM) NAIL INTERTROCHANTRIC;  Surgeon: Thornton Park, MD;  Location: [facility] ORS;  Service: Orthopedics;  Laterality: Right;   TONSILLECTOMY     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 She has never used smokeless tobacco. She reports that she does not drink alcohol and does not use drugs.  Allergies  Allergen Reactions   Prednisone Anaphylaxis   Other     Family History  Problem Relation Age of Onset   Diabetes Father    Heart disease Father    Stroke Maternal Grandmother     Prior to Admission medications   Medication Sig Start Date End Date Taking? Authorizing Provider  acetaminophen (TYLENOL) 500 MG tablet Take 1,000 mg by mouth every 8 (eight) hours as needed for moderate pain. Reported on 12/16/2015    [provider]  diclofenac Sodium (VOLTAREN) 1 % GEL Apply 4 g topically 4 (four) times daily. 02/04/22   [provider]  docusate sodium (COLACE) 100 MG capsule Take 1 capsule (100 mg total) by mouth 2 (two) times daily. 12/10/15   Dustin Flock, MD  empagliflozin (JARDIANCE) 10 MG TABS tablet Take by mouth daily.    [provider]  gabapentin (NEURONTIN) 300 MG capsule Take 1 capsule (300 mg total) by mouth 3 (three) times daily. 04/10/22   Sidney Ace, MD  metFORMIN (GLUCOPHAGE) 500 MG tablet Take 500 mg by mouth 2 (two) times daily.  09/13/17   [provider]  metoprolol tartrate (LOPRESSOR) 25 MG tablet Take 1 tablet (25 mg total) by mouth 2 (two) times daily. 11/09/15   Henreitta Leber, MD  polyethylene glycol (MIRALAX / GLYCOLAX) packet Take 17 g by mouth daily. Patient taking differently: Take 17 g by mouth daily as needed.  12/10/15   Dustin Flock, MD  pravastatin (PRAVACHOL) 40 MG tablet Take 40 mg by mouth daily. 11/23/21   [provider]    Physical Exam: Vitals:   04/15/22 1743 04/15/22 1800 04/15/22 1830 04/15/22 1930  BP: 140/64 135/61 (!) 146/69 133/71  Pulse: 100 99 (!) 101 (!) 101  Resp: 20     Temp:      TempSrc:      SpO2: 90% 93% 94% 94%  Weight:      Height:       Physical Exam Vitals and nursing note reviewed.  Constitutional:      General: She is not in acute distress.    Comments: Frail-appearing elderly female  HENT:     Head: Normocephalic and atraumatic.     Comments: Right mandibular swelling Cardiovascular:     Rate and Rhythm: Normal rate and regular rhythm.     Heart sounds: Normal heart sounds.  Pulmonary:     Effort: Pulmonary effort is normal.     Breath sounds: Normal breath sounds.  Abdominal:     Palpations: Abdomen is soft.     Tenderness: There is no abdominal tenderness.  Neurological:     Mental Status: Mental status is at baseline.  Labs on Admission: I have personally reviewed following labs and imaging studies  CBC: Recent Labs  Lab 04/15/22 1442  WBC 27.6*  HGB 14.4  HCT 46.2*  MCV 85.4  PLT 654*   Basic Metabolic Panel: Recent Labs  Lab 04/10/22 0547 04/15/22 1442  NA  --  132*  K  --  3.5  CL  --  96*  CO2  --  27  GLUCOSE  --  98  BUN  --  14  CREATININE 0.42* 0.41*  CALCIUM  --  9.0   GFR: Estimated Creatinine Clearance: 69.2 mL/min (A) (by C-G formula based on SCr of 0.41 mg/dL (L)). Liver Function Tests: Recent Labs  Lab 04/15/22 1442  AST 48*  ALT 15  ALKPHOS 561*  BILITOT 1.2  PROT 6.9  ALBUMIN 2.4*   Recent Labs  Lab 04/15/22 1442  LIPASE 24   No results for input(s): "AMMONIA" in the last 168 hours. Coagulation Profile: No results for input(s): "INR", "PROTIME" in the last 168 hours. Cardiac Enzymes: No results for input(s): "CKTOTAL", "CKMB", "CKMBINDEX", "TROPONINI" in the last 168  hours. BNP (last 3 results) No results for input(s): "PROBNP" in the last 8760 hours. HbA1C: No results for input(s): "HGBA1C" in the last 72 hours. CBG: Recent Labs  Lab 04/09/22 0935 04/09/22 1118 04/09/22 1726 04/09/22 2040  GLUCAP 128* 176* 133* 108*   Lipid Profile: No results for input(s): "CHOL", "HDL", "LDLCALC", "TRIG", "CHOLHDL", "LDLDIRECT" in the last 72 hours. Thyroid Function Tests: No results for input(s): "TSH", "T4TOTAL", "FREET4", "T3FREE", "THYROIDAB" in the last 72 hours. Anemia Panel: No results for input(s): "VITAMINB12", "FOLATE", "FERRITIN", "TIBC", "IRON", "RETICCTPCT" in the last 72 hours. Urine analysis:    Component Value Date/Time   COLORURINE YELLOW 04/02/2022 2007   APPEARANCEUR TURBID (A) 04/02/2022 2007   LABSPEC 1.020 04/02/2022 2007   PHURINE 6.5 04/02/2022 2007   GLUCOSEU 500 (A) 04/02/2022 2007   HGBUR LARGE (A) 04/02/2022 2007   BILIRUBINUR SMALL (A) 04/02/2022 2007   KETONESUR 15 (A) 04/02/2022 2007   PROTEINUR 100 (A) 04/02/2022 2007   NITRITE NEGATIVE 04/02/2022 2007   LEUKOCYTESUR SMALL (A) 04/02/2022 2007    Radiological Exams on Admission: CT ABDOMEN PELVIS W CONTRAST  Result Date: 04/15/2022 CLINICAL DATA:  Abdominal pain and distention EXAM: CT ABDOMEN AND PELVIS WITH CONTRAST TECHNIQUE: Multidetector CT imaging of the abdomen and pelvis was performed using the standard protocol following bolus administration of intravenous contrast. RADIATION DOSE REDUCTION: This exam was performed according to the departmental dose-optimization program which includes automated exposure control, adjustment of the mA and/or kV according to patient size and/or use of iterative reconstruction technique. CONTRAST:  13m OMNIPAQUE IOHEXOL 300 MG/ML  SOLN COMPARISON:  Previous studies including the CT abdomen and pelvis done on 01/05/2016 and CT chest done on 01/29/2022 FINDINGS: Lower chest: There are multiple nodules of varying sizes in the lower lung  fields. There is a 3.2 cm nodule/mass in the left lower lobe. There are small patchy infiltrates in both lower lobes. Minimal bilateral pleural effusions are seen. Dense calcification is seen in mitral annulus. Hepatobiliary: There is interval appearance of multiple low-density space-occupying lesions in liver largest measuring 7.5 x 4.1 cm in the anterior aspect of the liver. There is no dilation of bile ducts. There is increased density in the dependent portion of gallbladder suggesting gallbladder stones. Pancreas: There is atrophy.  No focal abnormalities are seen. Spleen: Unremarkable. Adrenals/Urinary Tract: There is 11 mm nodule in left adrenal. There  are small nodular densities in right adrenal measuring up to 11 mm. There is no hydronephrosis. Lobulations are seen in the margins of the kidneys, possibly suggesting scarring. There is 17 mm calculus in the upper pole of right kidney. There is 12 mm calculus in the midportion. There is 12 mm calculus in the lower pole. Ureters are not dilated. Urinary bladder is distended. There is air in the bladder, possibly suggesting recent instrumentation. Diverticulum is noted in the left posterior aspect of the urinary bladder. Stomach/Bowel: Small hiatal hernia is seen. Stomach is not distended. Small bowel loops not dilated. Appendix is not dilated. Scattered diverticula seen in colon without signs of focal acute diverticulitis. There is wall thickening in 5.7 cm segment of sigmoid colon in midline. Oral contrast has reached the rectum. There is large amount of stool in rectum. Transverse diameter of rectum measures 9.4 cm. There is no significant wall thickening in rectum. Vascular/Lymphatic: Scattered arterial calcifications are seen. Reproductive: There is low-density in the central portion of the uterus. There is 2.2 cm fluid density structure in the right adnexa. Other: There is no ascites or pneumoperitoneum. Umbilical hernia containing fat is seen.  Musculoskeletal: There is previous internal fixation in right femur with intramedullary rod. Degenerative changes are noted in right hip. Degenerative changes are noted in lower thoracic spine and lumbar spine. There is fracture in the upper endplate of body of L3 vertebra which may be recent or old. Decrease in height of multiple thoracic vertebral bodies may suggest recent or old compression fractures. There is inhomogeneous in attenuation in the bony structures this may be due to osteoporosis or infiltrative neoplastic process. There is a lytic lesion with adjacent soft tissue mass in the posterior aspect of left iliac crest. IMPRESSION: There is 5.7 cm mass lesion in sigmoid colon suggesting possible primary malignant neoplasm. There is no evidence of intestinal obstruction or pneumoperitoneum. There is no hydronephrosis. There are multiple nodules of varying sizes in the visualized lower lung fields consistent with pulmonary metastatic disease. Patchy infiltrates in the lower lung fields may suggest atelectasis/pneumonia. Small bilateral pleural effusions. There are multiple space-occupying lesions of varying sizes in the liver measuring up to 7.5 cm in size suggesting hepatic metastatic disease. There are few nodular densities in both adrenals suggesting possible metastatic disease. There is inhomogeneous attenuation in the bony structures suggesting possible skeletal metastatic disease. There is a lytic lesion in the posterior medial aspect of left iliac crest consistent with skeletal metastatic disease. Other findings as described in the body of the report. Electronically Signed   By: Elmer Picker M.D.   On: 04/15/2022 20:16   CT Maxillofacial W Contrast  Result Date: 04/15/2022 CLINICAL DATA:  Facial abscess EXAM: CT MAXILLOFACIAL WITH CONTRAST TECHNIQUE: Multidetector CT imaging of the maxillofacial structures was performed with intravenous contrast. Multiplanar CT image reconstructions were  also generated. RADIATION DOSE REDUCTION: This exam was performed according to the departmental dose-optimization program which includes automated exposure control, adjustment of the mA and/or kV according to patient size and/or use of iterative reconstruction technique. CONTRAST:  119m OMNIPAQUE IOHEXOL 300 MG/ML  SOLN COMPARISON:  None Available. FINDINGS: Osseous: The right mandibular body is expanded and hyperlucent with the abnormal area encompassing the roots of the right mandibular molars. There is multifocal cortical destruction. There is a surrounding soft tissue component Orbits: Negative. No traumatic or inflammatory finding. Sinuses: Clear. Soft tissues: Soft tissue swelling surrounding the right mandibular body. Limited intracranial: No significant or unexpected finding. IMPRESSION:  Expanded right mandibular body with surrounding soft tissue and multifocal cortical destruction, likely indicating active osteomyelitis with an intra osseous necrotic or purulence collection. Clinical follow-up after resolution of symptoms is recommended to exclude a superimposed neoplastic process. Electronically Signed   By: Ulyses Jarred M.D.   On: 04/15/2022 20:04   DG Abdomen 1 View  Result Date: 04/15/2022 CLINICAL DATA:  Constipation.  Abdominal distension. EXAM: ABDOMEN - 1 VIEW COMPARISON:  04/09/22 FINDINGS: Gallstones are identified. There is been interval decrease in previously noted air-filled loops of small bowel within the left hemiabdomen. Progressive accumulation of stool is identified throughout the colon and rectum. Large volume of desiccated stool within the rectum has increased in volume compared with the previous study. Cannot rule out rectal impaction. No signs of pneumoperitoneum. Lumbar scoliosis and degenerative disc disease. Status post ORIF of the right femur. Marked degenerative disc disease is noted involving the right hip. Moderate degenerative disc disease noted in the left hip.  IMPRESSION: 1. Progressive accumulation of stool throughout the colon and rectum compatible with constipation. Cannot rule out rectal impaction. 2. Interval decrease in previously noted air-filled loops of small bowel within the left hemiabdomen. 3. Gallstones. Electronically Signed   By: Kerby Moors M.D.   On: 04/15/2022 16:13     Data Reviewed: Relevant notes from primary care and specialist visits, past discharge summaries as available in EHR, including Care Everywhere. Prior diagnostic testing as pertinent to current admission diagnoses Updated medications and problem lists for reconciliation ED course, including vitals, labs, imaging, treatment and response to treatment Triage notes, nursing and pharmacy notes and ED provider's notes Notable results as noted in HPI   Assessment and Plan: Colon cancer metastasized to liver, lung and bone (HCC) Constipation, multifactorial Patient presents with constipation and abdominal pain, possibly related to sigmoid cancer seen on CT though without evidence of obstruction Patient is on chronic narcotics so this could also be the etiology of her constipation Stool softeners, laxatives and enema if needed Patient desires no intervention or further diagnostic evaluation Was seen by palliative care on 7/16 prior to her recent discharge Can consider reconsulting palliative care  Acute osteomyelitis of mandible Possible metastatic lesion right mandible Patient has right-sided jaw pain and swelling with leukocytosis of 27,000 and abnormal CT findings CT shows: Expanded right mandibular body with surrounding soft tissue and multifocal cortical destruction, likely indicating active osteomyelitis with an intra osseous necrotic or purulence collection Continue Unasyn and transition to Augmentin for discharge ENT consult to follow, though based on conversation with ED provider no surgical intervention recommended due to likely malignant and infectious  component Pain control  Hypoxia O2 sat was 90-94 in the ED and patient was placed on O2 at 2 to 3 L Continue O2 and wean as tolerated Suspect etiology related to lung metastases, possibly underlying OSA Evaluate for home O2 at discharge  Frailty syndrome in geriatric patient Patient with a history of recent frequent falls resulting in injury, untreated metastatic cancer, recently hospitalized and discharged to SNF [facility] and nutritionist consult  Chronic pain Continue gabapentin and acetaminophen  PAD (peripheral artery disease) (HCC) Continue pravastatin  Type 2 diabetes mellitus ([employer]) Continue Jardiance and metformin and sliding scale insulin coverage        DVT prophylaxis: Lovenox  Consults: ENT, Dr. Tami Ribas  Advance Care Planning:   Code Status: Prior   Family Communication: none  Disposition Plan: Back to previous home environment  Severity of Illness: The appropriate patient status for this  patient is OBSERVATION. Observation status is judged to be reasonable and necessary in order to provide the required intensity of service to ensure the patient's safety. The patient's presenting symptoms, physical exam findings, and initial radiographic and laboratory data in the context of their medical condition is felt to place them at decreased risk for further clinical deterioration. Furthermore, it is anticipated that the patient will be medically stable for discharge from the hospital within 2 midnights of admission.   Author: Athena Masse, MD 04/15/2022 10:29 PM  For on call review [email or web address].

## 2022-04-15 NOTE — ED Provider Notes (Signed)
-----------------------------------------   7:30 PM on 04/15/2022 -----------------------------------------  Blood pressure 140/64, pulse 100, temperature 98.1 F (36.7 C), temperature source Oral, resp. rate 20, height 5\' 6"  (1.676 m), weight 91.4 kg, SpO2 90 %.  Assuming care from [facility], FNP  In short, April [name] is a 76 y.o. female with a chief complaint of Abdominal Pain and Constipation .  Refer to the original H&P for additional details.  The current plan of care is to await CT scans.  Patient presented to the ED with complaint primarily of constipation.  During the initial interview with the patient, patient had facial swelling along the right mandibular line.  Previous provider had identified a intraoral lesion with disruption of the patient's molars.  Given this fact patient will have CT scan of her abdomen pelvis as well as maxillofacial region.  ----------------------------------------- 9:07 PM on 04/15/2022 ----------------------------------------- Patient with results from CT scan revealing findings concerning for facial abscess with possible neoplastic process as well as scan results from her abdomen pelvis which reveals a colonic mass concerning for colonic cancer.  There are other findings to include nodules in the lung consistent with metastatic disease, intrahepatic lesions concerning for metastatic disease as well as other osseous findings concerning for metastatic disease.  I have discussed the results with the patient.  At this time the patient states that she does not wish to pursue any oncology work-up.  Was amenable to discussions regarding the mandibular findings.  I reached out to on-call ENT surgeon, Dr. Tami Ribas.  Dr. Tami Ribas advises with the concern for malignancy within the mandible as well as the current findings there is no operative management.  At this time he recommends antibiotics.  Patient will get Unasyn in the emergency department and morphine for  her pain.  Currently she is endorsing uncontrolled pain both in her jaw, she has a known humerus fracture from a fall 2 weeks ago.  Patient is on oxycodone at home but states that the increased pain is unmanageable at this time.  Given the infectious process, unmanaged pain I will reach out to the hospitalist team for admission..   ED diagnosis:  Mandibular abscess Colon cancer Metastatic disease Constipation    Guerry Minors Charline Bills, PA-C 04/15/22 2220    Delman Kitten, MD 04/16/22 0006

## 2022-04-15 NOTE — Assessment & Plan Note (Addendum)
Continue pravastatin 

## 2022-04-15 NOTE — Progress Notes (Unsigned)
[facility] Palliative Care Consult Note Telephone: [phone]  Fax: [phone]   Date of encounter: 04/15/22 2:38 PM PATIENT NAME: April [name] [address] [place] [address]   [phone] (home)  DOB: [birth year] MRN: [id number] PRIMARY CARE PROVIDER:   [place], MD,  [facility] [address]   [place] [address] [phone]  REFERRING PROVIDER:   Albina Billet, MD [address]   [place],  [place] [address] [phone]  RESPONSIBLE PARTY:    Contact Information     Name Relation Home Work [place]   [phone]   [place] Other [phone]        I met face to face with patient in facility. Palliative Care was asked to follow this patient by consultation request of  Albina Billet, MD to address advance care planning and complex medical decision making. This is the initial visit.                   ASSESSMENT AND PLAN / RECOMMENDATIONS:  Symptom Management/Plan: 1. Advance Care Planning;  Full code, will need further discussions of goc  2. Nausea, abdominal pain, dehydration with possible bowel obstruction with no bmi in >2 weeks and clinical presentation. April [name] in agreement to return to ED for further workup with concerning current clinical state, critical condition. Sent by EMS to ED  3. Palliative care encounter; Palliative care encounter; Palliative medicine team will continue to support patient, patient's family, and medical team. Visit consisted of counseling and education dealing with the complex and emotionally intense issues of symptom management and palliative care in the setting of serious and potentially life-threatening illness Follow up Palliative Care Visit: Palliative care will continue to follow for complex medical decision making, advance care planning, and clarification of goals. Return 1 weeks or prn.  I spent 47 minutes providing this consultation.  More than 50% of the time in this consultation was spent in counseling and care coordination. PPS: 40% Chief Complaint: Follow up palliative consult for complex medical decision making, address goals, manage ongoing symptoms  HISTORY OF PRESENT ILLNESS:  April [name] is a 76 y.o. year old female  with multiple medial problems including DM, HTN, PVD, chronic leukocytosis ?etiology. Hospitalized 01/28/2022 to 02/02/2022 for right humerus fracture s/p fall with conservative management with incidental findings of left lung mass suspicious for malignancy which April [name] declined further workup. Hospitalized 04/02/2022 to 04/10/2022 for right knee pain following fall, leukocytosis likely from UTI, klebsiella pna in addition to mass of lower lobe left lung with hypoxia, declined PET scan. April [name] was sent to [facility] for STR where she currently resides. April [name] per staff requires assistance for mobility, transfers, adl's bathing, dressing. April [name] feeds herself, staff reports very weak. At present April [name] is lying in bed, with O2, appears sick, some mild shortness of breath. I visited and observed April [name]. Discussed PC, agreeable. April [name] endorses she is have left sided abdominal pain with nausea, no bm in 2 weeks. April [name] endorses she has eaten a few bits of food this am, poor appetite, decreased oral intake. April [name] endorses decrease urine output, has not urinated today. April [name] endorses she overall is weak, appears pale. With assessment, discussed with Primary Jackey Loge NP at [facility] and in agreement to send to ED for further workup  History obtained from review of EMR, discussion with primary team, and  interview with family, facility staff/caregiver and/or April [name].  I reviewed available labs, medications, imaging, studies and related documents from the EMR.  Records reviewed and summarized above.   ROS 10 point system reviewed +Nausea, +generalized weakness, +sob  Physical  Exam: Constitutional: NAD General: fragile, ill appearing, pale, distressed female EYES: lids intact ENMT: oral mucous membranes dry, facial edema CV: S1S2, RRR, no LE edema Pulmonary: decreased bases, no cough Abdomen: few hypoactive faint bs, distended MSK: ambulatory Skin: warm and dry, poor skin turgor Neuro:  + generalized weakness,  no cognitive impairment Psych: A and O x 3 CURRENT PROBLEM LIST:  Patient Active Problem List   Diagnosis Date Noted   Vitamin D deficiency 04/05/2022   UTI (urinary tract infection) 04/05/2022   Fall at home, initial encounter 04/03/2022   Frequent falls 04/03/2022   Mass of lower lobe of left lung on CT chest 01/30/22    Closed displaced fracture of surgical neck of humerus, unspecified fracture morphology, initial encounter    Acute cystitis without hematuria    Abnormal CT of the chest    Hypoxia 01/28/2022   Lymphedema 07/02/2019   S/P right hip fracture 02/05/2019   PAD (peripheral artery disease) ([place]) 10/03/2017   Essential hypertension 10/03/2017   Pain in limb 03/21/2017   Swelling of limb 10/18/2016   Ulcer of calf ([place]) 10/18/2016   Gangrene of foot ([place]) 03/03/2016   UPJ (ureteropelvic junction) obstruction    Paroxysmal atrial fibrillation (HCC) 12/04/2015   Back pain 12/04/2015   Thrombocytopenia ([place]) 12/03/2015   Leukocytosis 12/03/2015   Type 2 diabetes mellitus ([place]) 12/03/2015   Respiratory failure ([employer])    Proteus septicemia ([place])    Acute renal failure (ARF) ([place])    Hydronephrosis    Endotracheally intubated    Respiratory distress    Arterial hypotension    Sepsis ([place]) 10/20/2015   Acute renal failure (HCC)    Altered mental status    NSTEMI (non-ST elevated myocardial infarction) ([place])    Sepsis due to urinary tract infection ([place])    PAST MEDICAL HISTORY:  Active Ambulatory Problems    Diagnosis Date Noted   Sepsis ([place]) 10/20/2015   Acute renal failure (HCC)    Altered mental status    NSTEMI (non-ST  elevated myocardial infarction) ([place])    Sepsis due to urinary tract infection ([place])    Acute renal failure (ARF) ([place])    Hydronephrosis    Endotracheally intubated    Respiratory distress    Arterial hypotension    Respiratory failure (HCC)    Proteus septicemia ([place])    Thrombocytopenia ([place]) 12/03/2015   Leukocytosis 12/03/2015   Type 2 diabetes mellitus ([place]) 12/03/2015   Paroxysmal atrial fibrillation ([place]) 12/04/2015   Back pain 12/04/2015   UPJ (ureteropelvic junction) obstruction    Gangrene of foot ([place]) 03/03/2016   Swelling of limb 10/18/2016   Ulcer of calf ([place]) 10/18/2016   Pain in limb 03/21/2017   PAD (peripheral artery disease) ([place]) 10/03/2017   Essential hypertension 10/03/2017   S/P right hip fracture 02/05/2019   Lymphedema 07/02/2019   Hypoxia 01/28/2022   Closed displaced fracture of surgical neck of humerus, unspecified fracture morphology, initial encounter    Acute cystitis without hematuria    Abnormal CT of the chest    Mass of lower lobe of left lung on CT chest 01/30/22    Fall at home, initial encounter 04/03/2022   Frequent falls 04/03/2022   Vitamin D deficiency 04/05/2022  UTI (urinary tract infection) 04/05/2022   Resolved Ambulatory Problems    Diagnosis Date Noted   No Resolved Ambulatory Problems   Past Medical History:  Diagnosis Date   Difficult intubation    Hypertension    Renal disorder    SOCIAL HX:  Social History   Tobacco Use   Smoking status: Never   Smokeless tobacco: Never  Substance Use Topics   Alcohol use: No    Alcohol/week: 0.0 standard drinks of alcohol   FAMILY HX:  Family History  Problem Relation Age of Onset   Diabetes Father    Heart disease Father    Stroke Maternal Grandmother       ALLERGIES:  Allergies  Allergen Reactions   Prednisone Anaphylaxis   Other      PERTINENT MEDICATIONS:  Outpatient Encounter Medications as of 04/15/2022  Medication Sig   acetaminophen (TYLENOL) 500 MG  tablet Take 1,000 mg by mouth every 8 (eight) hours as needed for moderate pain. Reported on 12/16/2015   diclofenac Sodium (VOLTAREN) 1 % GEL Apply 4 g topically 4 (four) times daily.   docusate sodium (COLACE) 100 MG capsule Take 1 capsule (100 mg total) by mouth 2 (two) times daily.   empagliflozin (JARDIANCE) 10 MG TABS tablet Take by mouth daily.   gabapentin (NEURONTIN) 300 MG capsule Take 1 capsule (300 mg total) by mouth 3 (three) times daily.   metFORMIN (GLUCOPHAGE) 500 MG tablet Take 500 mg by mouth 2 (two) times daily.    metoprolol tartrate (LOPRESSOR) 25 MG tablet Take 1 tablet (25 mg total) by mouth 2 (two) times daily.   polyethylene glycol (MIRALAX / GLYCOLAX) packet Take 17 g by mouth daily. (Patient taking differently: Take 17 g by mouth daily as needed.)   pravastatin (PRAVACHOL) 40 MG tablet Take 40 mg by mouth daily.   No facility-administered encounter medications on file as of 04/15/2022.   Thank you for the opportunity to participate in the care of April [name].  The palliative care team will continue to follow. Please call our office at [phone] if we can be of additional assistance.   Drewey Begue Z Analie Katzman, NP ,

## 2022-04-15 NOTE — Assessment & Plan Note (Addendum)
Continue gabapentin, oxycodone, morphine and acetaminophen.

## 2022-04-15 NOTE — Assessment & Plan Note (Addendum)
Continue sliding scale insulin coverage.  Stopping Jardiance and metformin as she has very poor p.o. intake

## 2022-04-15 NOTE — ED Notes (Signed)
Patient's brief was removed which was soaked with urine. Chux was placed under patient and purewick is in place per April Nail NP. Pillow was placed under patient's head. CT called that patient is ready.

## 2022-04-15 NOTE — Assessment & Plan Note (Addendum)
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

## 2022-04-15 NOTE — ED Provider Notes (Signed)
[facility] Provider Note    Event Date/Time   First MD Initiated Contact with Patient 04/15/22 1744     (approximate)   History   Abdominal Pain and Constipation   HPI  April [name] is a 76 y.o. female presents to the emergency department for treatment and evaluation of constipation. Last bowel movement was estimated to be 2-3 weeks ago. She is also having difficulty urinating.   Past Medical History:  Diagnosis Date   Acute renal failure (ARF) ([place])    Difficult intubation    Hypertension    NSTEMI (non-ST elevated myocardial infarction) (HCC)    Paroxysmal atrial fibrillation (HCC)    Renal disorder    Sepsis [facility]) January 2017   [facility]   Type 2 diabetes mellitus [facility])      Physical Exam   Triage Vital Signs: ED Triage Vitals  Enc Vitals Group     BP 04/15/22 1440 140/67     Pulse Rate 04/15/22 1440 92     Resp 04/15/22 1440 18     Temp 04/15/22 1440 98.1 F (36.7 C)     Temp Source 04/15/22 1440 Oral     SpO2 04/15/22 1440 94 %     Weight 04/15/22 1441 201 lb 8 oz (91.4 kg)     Height 04/15/22 1441 5\' 6"  (1.676 m)     Head Circumference --      Peak Flow --      Pain Score 04/15/22 1440 6     Pain Loc --      Pain Edu? --      Excl. in [place]? --     Most recent vital signs: Vitals:   04/15/22 1830 04/15/22 1930  BP: (!) 146/69 133/71  Pulse: (!) 101 (!) 101  Resp:    Temp:    SpO2: 94% 94%    General: Awake, no distress.  CV:  Good peripheral perfusion.  Resp:  Normal effort.  Abd:  Distended, soft, tender in transverse lower abdomen. Other:  Mass involving gingiva of right lower jaw.   ED Results / Procedures / Treatments   Labs (all labs ordered are listed, but only abnormal results are displayed) Labs Reviewed  COMPREHENSIVE METABOLIC PANEL - Abnormal; Notable for the following components:      Result Value   Sodium 132 (*)    Chloride 96 (*)    Creatinine, Ser 0.41 (*)    Albumin 2.4 (*)    AST 48 (*)     Alkaline Phosphatase 561 (*)    All other components within normal limits  CBC - Abnormal; Notable for the following components:   WBC 27.6 (*)    RBC 5.41 (*)    HCT 46.2 (*)    RDW 16.3 (*)    Platelets 440 (*)    All other components within normal limits  CULTURE, BLOOD (ROUTINE X 2)  CULTURE, BLOOD (ROUTINE X 2)  LIPASE, BLOOD  URINALYSIS, ROUTINE W REFLEX MICROSCOPIC  LACTIC ACID, PLASMA  LACTIC ACID, PLASMA     EKG  Not indicated   RADIOLOGY  Abdominal image shows progressive accumulation of stool throughout the rectum consistent with constipation and or impaction.  I have independently reviewed and interpreted imaging as well as reviewed report from radiology.  PROCEDURES:  Critical Care performed: No  Procedures   MEDICATIONS ORDERED IN ED:  Medications  iohexol (OMNIPAQUE) 300 MG/ML solution 100 mL (has no administration in time range)  IMPRESSION / MDM / ASSESSMENT AND PLAN / ED COURSE   I reviewed the triage vital signs and the nursing notes.  Differential diagnosis includes, but is not limited to: Sepsis; facial malignancy; osteomyelitis; constipation; small bowel obstruction  Patient's presentation is most consistent with acute presentation with potential threat to life or bodily function.  76 year old female presenting to the emergency department for treatment and evaluation of abdominal pain secondary to constipation for the past 2 to 3 weeks.  See HPI for further details.  Patient recently discharged after coming into the emergency department secondary to mechanical fall and found to have been hypoxic.  She was discharged to skilled nursing facility on 2 L of oxygen.  Hypoxia likely secondary to lung mass that is potentially malignant, however patient declined any further testing or work-up/treatment.  On exam today, the right side of her face was noted to be edematous.  When the patient was questioned she states that she has "something going  on with my tooth."  Mass noted to the gingiva of the right lower jaw.  This likely explains her white blood cell count of 27.6. Plan will be to get blood cultures and add CT maxillofacial with contrast in addition to the CT abdomen and pelvis to rule out SBO.  Abdomen is distended but soft.  She has no focal abdominal tenderness.  Lab studies from previous admission reviewed.  Urine culture positive for Klebsiella pneumoniae sensitive to everything except ampicillin and nitrofurantoin.  Care transitioned to J. Cuthriell, PA-C who will follow up on pending labs and images.     FINAL CLINICAL IMPRESSION(S) / ED DIAGNOSES   Final diagnoses:  Constipation, unspecified constipation type     Rx / DC Orders   ED Discharge Orders     None        Note:  This document was prepared using Dragon voice recognition software and may include unintentional dictation errors.   Victorino Dike, FNP 04/15/22 1935    Lucrezia Starch, MD 04/17/22 [phone]

## 2022-04-15 NOTE — ED Triage Notes (Signed)
Pt comes into the ED via EMS from [facility] , c/o abd pain with distention, tenderness, last BM was 2-3 weeks ago, a/ox4. Pt is bed bound  142/80 HR93 93-94 on 2L [place] continuous 97.4 temp CBG 98

## 2022-04-15 NOTE — Assessment & Plan Note (Signed)
Patient with a history of recent frequent falls resulting in injury, untreated metastatic cancer, recently hospitalized and discharged to SNF [facility] and nutritionist consult

## 2022-04-15 NOTE — Progress Notes (Signed)
Anticoagulation monitoring(Lovenox):  75yo  F ordered Lovenox 40 mg Q24h    Filed Weights   04/15/22 1441  Weight: 91.4 kg (201 lb 8 oz)   BMI 32   Lab Results  Component Value Date   CREATININE 0.41 (L) 04/15/2022   CREATININE 0.42 (L) 04/10/2022   CREATININE 0.42 (L) 04/08/2022   Estimated Creatinine Clearance: 69.2 mL/min (A) (by C-G formula based on SCr of 0.41 mg/dL (L)). Hemoglobin & Hematocrit     Component Value Date/Time   HGB 14.4 04/15/2022 1442   HCT 46.2 (H) 04/15/2022 1442     Per Protocol for Patient with estCrcl >30 ml/min and BMI > 30, will transition to Lovenox 0.5 mg/kg Q24h       Chinita Greenland PharmD Clinical Pharmacist 04/15/2022

## 2022-04-15 NOTE — Assessment & Plan Note (Addendum)
O2 sat was 90-94 in the ED and patient was placed on O2 at 2 to 3 L Continue O2 and wean as tolerated Suspect etiology related to lung metastases, possibly underlying OSA Evaluate for home O2 at discharge

## 2022-04-16 ENCOUNTER — Encounter: Payer: Self-pay | Admitting: Internal Medicine

## 2022-04-16 DIAGNOSIS — J9611 Chronic respiratory failure with hypoxia: Secondary | ICD-10-CM

## 2022-04-16 DIAGNOSIS — C801 Malignant (primary) neoplasm, unspecified: Secondary | ICD-10-CM | POA: Diagnosis not present

## 2022-04-16 DIAGNOSIS — C7802 Secondary malignant neoplasm of left lung: Secondary | ICD-10-CM | POA: Diagnosis present

## 2022-04-16 DIAGNOSIS — Z8619 Personal history of other infectious and parasitic diseases: Secondary | ICD-10-CM | POA: Diagnosis not present

## 2022-04-16 DIAGNOSIS — C799 Secondary malignant neoplasm of unspecified site: Secondary | ICD-10-CM | POA: Diagnosis not present

## 2022-04-16 DIAGNOSIS — I739 Peripheral vascular disease, unspecified: Secondary | ICD-10-CM

## 2022-04-16 DIAGNOSIS — D72829 Elevated white blood cell count, unspecified: Secondary | ICD-10-CM | POA: Diagnosis present

## 2022-04-16 DIAGNOSIS — Z66 Do not resuscitate: Secondary | ICD-10-CM | POA: Diagnosis present

## 2022-04-16 DIAGNOSIS — M272 Inflammatory conditions of jaws: Secondary | ICD-10-CM

## 2022-04-16 DIAGNOSIS — Z8249 Family history of ischemic heart disease and other diseases of the circulatory system: Secondary | ICD-10-CM | POA: Diagnosis not present

## 2022-04-16 DIAGNOSIS — K5900 Constipation, unspecified: Secondary | ICD-10-CM | POA: Diagnosis present

## 2022-04-16 DIAGNOSIS — I480 Paroxysmal atrial fibrillation: Secondary | ICD-10-CM | POA: Diagnosis present

## 2022-04-16 DIAGNOSIS — C7951 Secondary malignant neoplasm of bone: Secondary | ICD-10-CM | POA: Diagnosis present

## 2022-04-16 DIAGNOSIS — I252 Old myocardial infarction: Secondary | ICD-10-CM | POA: Diagnosis not present

## 2022-04-16 DIAGNOSIS — C187 Malignant neoplasm of sigmoid colon: Secondary | ICD-10-CM | POA: Diagnosis present

## 2022-04-16 DIAGNOSIS — Z9181 History of falling: Secondary | ICD-10-CM | POA: Diagnosis not present

## 2022-04-16 DIAGNOSIS — Z7984 Long term (current) use of oral hypoglycemic drugs: Secondary | ICD-10-CM | POA: Diagnosis not present

## 2022-04-16 DIAGNOSIS — Z87440 Personal history of urinary (tract) infections: Secondary | ICD-10-CM | POA: Diagnosis not present

## 2022-04-16 DIAGNOSIS — R54 Age-related physical debility: Secondary | ICD-10-CM

## 2022-04-16 DIAGNOSIS — C787 Secondary malignant neoplasm of liver and intrahepatic bile duct: Secondary | ICD-10-CM | POA: Diagnosis present

## 2022-04-16 DIAGNOSIS — E876 Hypokalemia: Secondary | ICD-10-CM | POA: Diagnosis present

## 2022-04-16 DIAGNOSIS — G8929 Other chronic pain: Secondary | ICD-10-CM | POA: Diagnosis present

## 2022-04-16 DIAGNOSIS — I10 Essential (primary) hypertension: Secondary | ICD-10-CM | POA: Diagnosis present

## 2022-04-16 DIAGNOSIS — E1151 Type 2 diabetes mellitus with diabetic peripheral angiopathy without gangrene: Secondary | ICD-10-CM | POA: Diagnosis present

## 2022-04-16 DIAGNOSIS — K6389 Other specified diseases of intestine: Secondary | ICD-10-CM | POA: Diagnosis present

## 2022-04-16 DIAGNOSIS — Z833 Family history of diabetes mellitus: Secondary | ICD-10-CM | POA: Diagnosis not present

## 2022-04-16 DIAGNOSIS — C189 Malignant neoplasm of colon, unspecified: Secondary | ICD-10-CM | POA: Diagnosis not present

## 2022-04-16 DIAGNOSIS — Z888 Allergy status to other drugs, medicaments and biological substances status: Secondary | ICD-10-CM | POA: Diagnosis not present

## 2022-04-16 DIAGNOSIS — Z515 Encounter for palliative care: Secondary | ICD-10-CM | POA: Diagnosis not present

## 2022-04-16 DIAGNOSIS — C7800 Secondary malignant neoplasm of unspecified lung: Secondary | ICD-10-CM | POA: Diagnosis not present

## 2022-04-16 DIAGNOSIS — Z7189 Other specified counseling: Secondary | ICD-10-CM | POA: Diagnosis not present

## 2022-04-16 LAB — GLUCOSE, CAPILLARY
Glucose-Capillary: 126 mg/dL — ABNORMAL HIGH (ref 70–99)
Glucose-Capillary: 149 mg/dL — ABNORMAL HIGH (ref 70–99)
Glucose-Capillary: 142 mg/dL — ABNORMAL HIGH (ref 70–99)

## 2022-04-16 LAB — BASIC METABOLIC PANEL
Anion gap: 12 (ref 5–15)
BUN: 13 mg/dL (ref 8–23)
CO2: 27 mmol/L (ref 22–32)
Calcium: 9.2 mg/dL (ref 8.9–10.3)
Chloride: 97 mmol/L — ABNORMAL LOW (ref 98–111)
Creatinine, Ser: 0.5 mg/dL (ref 0.44–1.00)
GFR, Estimated: >60 mL/min (ref >60)
Glucose, Bld: 102 mg/dL — ABNORMAL HIGH (ref 70–99)
Potassium: 4.3 mmol/L (ref 3.5–5.1)
Sodium: 136 mmol/L (ref 135–145)

## 2022-04-16 LAB — CBC
HCT: 45 % (ref 36.0–46.0)
Hemoglobin: 14 g/dL (ref 12.0–15.0)
MCH: 27.1 pg (ref 26.0–34.0)
MCHC: 31.1 g/dL (ref 30.0–36.0)
MCV: 87.2 fL (ref 80.0–100.0)
Platelets: 457 10*3/uL — ABNORMAL HIGH (ref 150–400)
RBC: 5.16 MIL/uL — ABNORMAL HIGH (ref 3.87–5.11)
RDW: 16.1 % — ABNORMAL HIGH (ref 11.5–15.5)
WBC: 28.4 10*3/uL — ABNORMAL HIGH (ref 4.0–10.5)
nRBC: 0 % (ref 0.0–0.2)

## 2022-04-16 LAB — CBG MONITORING, ED
Glucose-Capillary: 98 mg/dL (ref 70–99)
Glucose-Capillary: 104 mg/dL — ABNORMAL HIGH (ref 70–99)

## 2022-04-16 MED ORDER — VANCOMYCIN HCL 1500 MG/300ML IV SOLN
1500.0000 mg | Freq: Once | INTRAVENOUS | Status: AC
  Administered 2022-04-16: 1500 mg via INTRAVENOUS
  Filled 2022-04-16: qty 300

## 2022-04-16 MED ORDER — OXYCODONE HCL 5 MG PO TABS
5.0000 mg | ORAL_TABLET | Freq: Four times a day (QID) | ORAL | Status: DC | PRN
  Administered 2022-04-16 – 2022-04-23 (×23): 5 mg via ORAL
  Filled 2022-04-16 (×23): qty 1

## 2022-04-16 MED ORDER — FLEET ENEMA 7-19 GM/118ML RE ENEM: 1.0000 | ENEMA | Freq: Every day | RECTAL | Status: DC | PRN

## 2022-04-16 MED ORDER — BISACODYL 10 MG RE SUPP
10.0000 mg | Freq: Once | RECTAL | Status: AC
  Administered 2022-04-16: 10 mg via RECTAL
  Filled 2022-04-16: qty 1

## 2022-04-16 MED ORDER — VANCOMYCIN HCL 1250 MG/250ML IV SOLN
1250.0000 mg | INTRAVENOUS | Status: DC
  Administered 2022-04-17 – 2022-04-18 (×2): 1250 mg via INTRAVENOUS
  Filled 2022-04-16 (×2): qty 250

## 2022-04-16 MED ORDER — POLYETHYLENE GLYCOL 3350 17 G PO PACK
17.0000 g | PACK | Freq: Two times a day (BID) | ORAL | Status: DC
  Administered 2022-04-16 – 2022-04-23 (×12): 17 g via ORAL
  Filled 2022-04-16 (×14): qty 1

## 2022-04-16 MED ORDER — CHLORHEXIDINE GLUCONATE 0.12 % MT SOLN
10.0000 mL | Freq: Four times a day (QID) | OROMUCOSAL | Status: DC
  Administered 2022-04-16 – 2022-04-24 (×23): 10 mL via OROMUCOSAL
  Filled 2022-04-16 (×21): qty 15

## 2022-04-16 NOTE — ED Notes (Signed)
Pt requested to speak to palliative care due to not wanting to go back to her current facility due to "poor care'.

## 2022-04-16 NOTE — Consult Note (Signed)
April [name], April [name] [id number] 03/13/1946 April [name], April [name]  Reason for Consult: Jaw mass  HPI: 76 year old female with complaints of right-sided jaw pain since December of last year.  She was recently admitted to the hospital following a fall, was noted to have a mass in her left lower lung.  This was suspicious for malignancy however by report in the chart the patient refused any further work-up or treatment was discharged to [place] with palliative care instructions.  She returned to the emergency room last night with intense abdominal pain and was complaining of right-sided jaw pain.  CT scan of the abdomen showed a large abdominal mass with apparent metastatic disease to the liver and the lungs.  A CT scan of the maxillofacial region showed a mass/fluid collection of the right side of the mandible.  Patient tells me she has had pain in her jaw off and on since December of last year.  No other history of significant dental disease.  Allergies:  Allergies  Allergen Reactions   Prednisone Anaphylaxis   Other     ROS: Review of systems normal other than 12 systems except per HPI.  PMH:  Past Medical History:  Diagnosis Date   Acute renal failure (ARF) ([place])    Difficult intubation    Hypertension    NSTEMI (non-ST elevated myocardial infarction) (HCC)    Paroxysmal atrial fibrillation (HCC)    Renal disorder    Sepsis [facility]) January 2017   [facility]   Type 2 diabetes mellitus (HCC)     FH:  Family History  Problem Relation Age of Onset   Diabetes Father    Heart disease Father    Stroke Maternal Grandmother     SH:  Social History   Socioeconomic History   Marital status: Single    Spouse name: Not on file   Number of children: Not on file   Years of education: Not on file   Highest education level: Not on file  Occupational History   Not on file  Tobacco Use   Smoking status: Never   Smokeless tobacco: Never  Substance and Sexual Activity   Alcohol use: No     Alcohol/week: 0.0 standard drinks of alcohol   Drug use: No   Sexual activity: Not on file  Other Topics Concern   Not on file  Social History Narrative   Not on file   Social Determinants of Health   Financial Resource Strain: Not on file  Food Insecurity: Not on file  Transportation Needs: Not on file  Physical Activity: Not on file  Stress: Not on file  Social Connections: Not on file  Intimate Partner Violence: Not on file    PSH:  Past Surgical History:  Procedure Laterality Date   broken hip  2019   CYSTOSCOPY WITH STENT PLACEMENT Right 11/03/2015   Procedure: CYSTOSCOPY WITH STENT PLACEMENT;  Surgeon: Hollice Espy, April [name];  Location: [facility] ORS;  Service: Urology;  Laterality: Right;   FRACTURE SURGERY Right    Elbow   INTRAMEDULLARY (IM) NAIL INTERTROCHANTERIC Right 02/07/2019   Procedure: INTRAMEDULLARY (IM) NAIL INTERTROCHANTRIC;  Surgeon: Thornton Park, April [name];  Location: [facility] ORS;  Service: Orthopedics;  Laterality: Right;   TONSILLECTOMY     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  The anterior nose is benign palpation the  neck no diffuse adenopathy noted.  Examination of the oral cavity oropharynx shows a large exophytic mass involving the right posterior mandible extends into the retromolar trigone region is pushing the posterior molar teeth medially.  There is no erythema overlying the mandible there is no evidence of fluctuance or abscess accumulation.  There is purulence coming from this superficial area overlying the necrotic mass of the posterior mandible on the right.   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  During her last admission she had refused any intervention including a PET scan.  I questioned her today about whether she would like any further intervention to try to ascertain the etiology of these masses within her colon, liver, lung and mandible.  Her biggest concern is the pain which is controlled on medication.  Her biggest pain complaint to me was abdominal, the mandibular pain was more of an ache.  There is no acute abscess that I can identify in the mandible, there is nothing which needs to be drained as the pus is coming from the region likely due to the dead necrotic tissue with secondary infection.  After a long discussion, she did tell me that she would like to be seen by oncology again to discuss possible treatment options and pain control.  I have spoken with Dr.Rao who is on-call for oncology, she has agreed to see her tomorrow to discuss with her possible options.  I would recommend continue with either oral Augmentin or IV Unasyn for the secondary infection of the necrotic tissue and Peridex mouth rinse until a final treatment plan has been identified and agreed upon April [name] was happy with this plan.  I will wait to hear further from Dr. Janese Banks if ENT biopsy will be needed.   April [name] 04/16/2022 3:43 PM

## 2022-04-16 NOTE — Progress Notes (Signed)
PROGRESS NOTE    April [name]  EXM:[id number] DOB: April 18, 1946 DOA: 04/15/2022 PCP: Albina Billet, MD    Brief Narrative:  April [name] is a 76 y.o. female with medical history significant for Type II DM, HTN, PVD, recently hospitalized from 7/8 to 7/16 for frequent falls and discharged to SNF, who at that time had chronic leukocytosis of unknown etiology but found to have Klebsiella UTI for which she was treated and who at the same time had a known lung mass seen on CT 01/30/2022 for which she declined treatment or further investigation, evaluated by palliative care prior to discharge, who now returns to the ED with a complaint of abdominal pain, constipation, and pain all over, including pain on her right jaw and ' something going on with her tooth'.  She states her last bowel movement was 2 to 3 weeks prior and she has pain with urination.  She denies fever or chills or cough or shortness of breath. ED course and data review: In the ED intermittently tachycardic to 101.  O2 sat intermittently 90-94 for which she was placed on nasal cannula 2 to 3 L.  Vitals otherwise unremarkable Labs notable for WBC of 27,000 with lactic acid 1.3, up from an high baseline of 19-21,000 during her hospitalization a couple weeks prior.  Platelets 440 and alk phos 561.  The emergency provider spoke with ENT specialist, Dr. Tami Ribas who advised that infection on mandible is possibly related to underlying malignancy so any surgical intervention would likely worsen outcome and thus recommended antibiotic therapy only.  Recommended oral Augmentin, according to discussion with ED provider  Consultants:  ENT by ED  Procedures: CT  Antimicrobials:  Unasyn   Subjective: Patient complains of constipation and has not had any bowel movement for many days  Objective: Vitals:   04/16/22 0345 04/16/22 0700 04/16/22 0751 04/16/22 0833  BP: 134/71 104/66 129/67 (!) 145/57  Pulse: 96 (!) 104 (!) 103 (!) 105  Resp: _0 Temp: 99 F (37.2 C)  98.3 F (36.8 C) 97.7 F (36.5 C)  TempSrc: Oral  Oral   SpO2: 90% 94% 94% 95%  Weight:      Height:        Intake/Output Summary (Last 24 hours) at 04/16/2022 1430 Last data filed at 04/16/2022 0447 Gross per 24 hour  Intake 200 ml  Output --  Net 200 ml   Filed Weights   04/15/22 1441  Weight: 91.4 kg    Examination: Calm, NAD Cta no w/r Reg s1/s2 no gallop Soft benign +bs No edema Awake and alert Mood and affect appropriate in current setting     Data Reviewed: I have personally reviewed following labs and imaging studies  CBC: Recent Labs  Lab 04/15/22 1442 04/16/22 0402  WBC 27.6* 28.4*  HGB 14.4 14.0  HCT 46.2* 45.0  MCV 85.4 87.2  PLT 440* 473*   Basic Metabolic Panel: Recent Labs  Lab 04/10/22 0547 04/15/22 1442 04/16/22 0402  NA  --  132* 136  K  --  3.5 4.3  CL  --  96* 97*  CO2  --  27 27  GLUCOSE  --  98 102*  BUN  --  14 13  CREATININE 0.42* 0.41* 0.50  CALCIUM  --  9.0 9.2   GFR: Estimated Creatinine Clearance: 69.2 mL/min (by C-G formula based on SCr of 0.5 mg/dL). Liver Function Tests: Recent Labs  Lab 04/15/22 1442  AST 48*  ALT 15  ALKPHOS 561*  BILITOT 1.2  PROT 6.9  ALBUMIN 2.4*   Recent Labs  Lab 04/15/22 1442  LIPASE 24   No results for input(s): "AMMONIA" in the last 168 hours. Coagulation Profile: No results for input(s): "INR", "PROTIME" in the last 168 hours. Cardiac Enzymes: No results for input(s): "CKTOTAL", "CKMB", "CKMBINDEX", "TROPONINI" in the last 168 hours. BNP (last 3 results) No results for input(s): "PROBNP" in the last 8760 hours. HbA1C: No results for input(s): "HGBA1C" in the last 72 hours. CBG: Recent Labs  Lab 04/09/22 1726 04/09/22 2040 04/16/22 0105 04/16/22 0727 04/16/22 1112  GLUCAP 133* 108* 98 104* 126*   Lipid Profile: No results for input(s): "CHOL", "HDL", "LDLCALC", "TRIG", "CHOLHDL", "LDLDIRECT" in the last 72 hours. Thyroid Function  Tests: No results for input(s): "TSH", "T4TOTAL", "FREET4", "T3FREE", "THYROIDAB" in the last 72 hours. Anemia Panel: No results for input(s): "VITAMINB12", "FOLATE", "FERRITIN", "TIBC", "IRON", "RETICCTPCT" in the last 72 hours. Sepsis Labs: Recent Labs  Lab 04/15/22 1911  LATICACIDVEN 1.3    Recent Results (from the past 240 hour(s))  Blood culture (routine x 2)     Status: None (Preliminary result)   Collection Time: 04/15/22  7:11 PM   Specimen: BLOOD LEFT WRIST  Result Value Ref Range Status   Specimen Description BLOOD LEFT WRIST  Final   Special Requests   Final    BOTTLES DRAWN AEROBIC AND ANAEROBIC Blood Culture adequate volume   Culture  Setup Time   Final    GRAM POSITIVE COCCI ANAEROBIC BOTTLE ONLY Organism ID to follow CRITICAL RESULT CALLED TO, READ BACK BY AND VERIFIED WITH: Performed at [facility], [address] [place], [place] [address]    Culture GRAM POSITIVE COCCI  Final   Report Status PENDING  Incomplete         Radiology Studies: CT ABDOMEN PELVIS W CONTRAST  Result Date: 04/15/2022 CLINICAL DATA:  Abdominal pain and distention EXAM: CT ABDOMEN AND PELVIS WITH CONTRAST TECHNIQUE: Multidetector CT imaging of the abdomen and pelvis was performed using the standard protocol following bolus administration of intravenous contrast. RADIATION DOSE REDUCTION: This exam was performed according to the departmental dose-optimization program which includes automated exposure control, adjustment of the mA and/or kV according to patient size and/or use of iterative reconstruction technique. CONTRAST:  121mL OMNIPAQUE IOHEXOL 300 MG/ML  SOLN COMPARISON:  Previous studies including the CT abdomen and pelvis done on 01/05/2016 and CT chest done on 01/29/2022 FINDINGS: Lower chest: There are multiple nodules of varying sizes in the lower lung fields. There is a 3.2 cm nodule/mass in the left lower lobe. There are small patchy infiltrates in both lower lobes.  Minimal bilateral pleural effusions are seen. Dense calcification is seen in mitral annulus. Hepatobiliary: There is interval appearance of multiple low-density space-occupying lesions in liver largest measuring 7.5 x 4.1 cm in the anterior aspect of the liver. There is no dilation of bile ducts. There is increased density in the dependent portion of gallbladder suggesting gallbladder stones. Pancreas: There is atrophy.  No focal abnormalities are seen. Spleen: Unremarkable. Adrenals/Urinary Tract: There is 11 mm nodule in left adrenal. There are small nodular densities in right adrenal measuring up to 11 mm. There is no hydronephrosis. Lobulations are seen in the margins of the kidneys, possibly suggesting scarring. There is 17 mm calculus in the upper pole of right kidney. There is 12 mm calculus in the midportion. There is 12 mm calculus in the lower pole. Ureters  are not dilated. Urinary bladder is distended. There is air in the bladder, possibly suggesting recent instrumentation. Diverticulum is noted in the left posterior aspect of the urinary bladder. Stomach/Bowel: Small hiatal hernia is seen. Stomach is not distended. Small bowel loops not dilated. Appendix is not dilated. Scattered diverticula seen in colon without signs of focal acute diverticulitis. There is wall thickening in 5.7 cm segment of sigmoid colon in midline. Oral contrast has reached the rectum. There is large amount of stool in rectum. Transverse diameter of rectum measures 9.4 cm. There is no significant wall thickening in rectum. Vascular/Lymphatic: Scattered arterial calcifications are seen. Reproductive: There is low-density in the central portion of the uterus. There is 2.2 cm fluid density structure in the right adnexa. Other: There is no ascites or pneumoperitoneum. Umbilical hernia containing fat is seen. Musculoskeletal: There is previous internal fixation in right femur with intramedullary rod. Degenerative changes are noted in  right hip. Degenerative changes are noted in lower thoracic spine and lumbar spine. There is fracture in the upper endplate of body of L3 vertebra which may be recent or old. Decrease in height of multiple thoracic vertebral bodies may suggest recent or old compression fractures. There is inhomogeneous in attenuation in the bony structures this may be due to osteoporosis or infiltrative neoplastic process. There is a lytic lesion with adjacent soft tissue mass in the posterior aspect of left iliac crest. IMPRESSION: There is 5.7 cm mass lesion in sigmoid colon suggesting possible primary malignant neoplasm. There is no evidence of intestinal obstruction or pneumoperitoneum. There is no hydronephrosis. There are multiple nodules of varying sizes in the visualized lower lung fields consistent with pulmonary metastatic disease. Patchy infiltrates in the lower lung fields may suggest atelectasis/pneumonia. Small bilateral pleural effusions. There are multiple space-occupying lesions of varying sizes in the liver measuring up to 7.5 cm in size suggesting hepatic metastatic disease. There are few nodular densities in both adrenals suggesting possible metastatic disease. There is inhomogeneous attenuation in the bony structures suggesting possible skeletal metastatic disease. There is a lytic lesion in the posterior medial aspect of left iliac crest consistent with skeletal metastatic disease. Other findings as described in the body of the report. Electronically Signed   By: Elmer Picker M.D.   On: 04/15/2022 20:16   CT Maxillofacial W Contrast  Result Date: 04/15/2022 CLINICAL DATA:  Facial abscess EXAM: CT MAXILLOFACIAL WITH CONTRAST TECHNIQUE: Multidetector CT imaging of the maxillofacial structures was performed with intravenous contrast. Multiplanar CT image reconstructions were also generated. RADIATION DOSE REDUCTION: This exam was performed according to the departmental dose-optimization program which  includes automated exposure control, adjustment of the mA and/or kV according to patient size and/or use of iterative reconstruction technique. CONTRAST:  163m OMNIPAQUE IOHEXOL 300 MG/ML  SOLN COMPARISON:  None Available. FINDINGS: Osseous: The right mandibular body is expanded and hyperlucent with the abnormal area encompassing the roots of the right mandibular molars. There is multifocal cortical destruction. There is a surrounding soft tissue component Orbits: Negative. No traumatic or inflammatory finding. Sinuses: Clear. Soft tissues: Soft tissue swelling surrounding the right mandibular body. Limited intracranial: No significant or unexpected finding. 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 Clinical follow-up after resolution of symptoms is recommended to exclude a superimposed neoplastic process. Electronically Signed   By: KUlyses JarredM.D.   On: 04/15/2022 20:04   DG Abdomen 1 View  Result Date: 04/15/2022 CLINICAL DATA:  Constipation.  Abdominal distension. EXAM: ABDOMEN - 1 VIEW COMPARISON:  04/09/22 FINDINGS: Gallstones are identified. There is been interval decrease in previously noted air-filled loops of small bowel within the left hemiabdomen. Progressive accumulation of stool is identified throughout the colon and rectum. Large volume of desiccated stool within the rectum has increased in volume compared with the previous study. Cannot rule out rectal impaction. No signs of pneumoperitoneum. Lumbar scoliosis and degenerative disc disease. Status post ORIF of the right femur. Marked degenerative disc disease is noted involving the right hip. Moderate degenerative disc disease noted in the left hip. IMPRESSION: 1. Progressive accumulation of stool throughout the colon and rectum compatible with constipation. Cannot rule out rectal  impaction. 2. Interval decrease in previously noted air-filled loops of small bowel within the left hemiabdomen. 3. Gallstones. Electronically Signed   By: Kerby Moors M.D.   On: 04/15/2022 16:13        Scheduled Meds:  docusate sodium  100 mg Oral BID   empagliflozin  10 mg Oral Daily   enoxaparin (LOVENOX) injection  0.5 mg/kg Subcutaneous Q24H   gabapentin  300 mg Oral TID   insulin aspart  0-15 Units Subcutaneous TID WC   insulin aspart  0-5 Units Subcutaneous QHS   [START ON 04/17/2022] metFORMIN  500 mg Oral BID   metoprolol tartrate  25 mg Oral BID   polyethylene glycol  17 g Oral BID   pravastatin  40 mg Oral Daily   Continuous Infusions:  ampicillin-sulbactam (UNASYN) IV 3 g (04/16/22 0940)    Assessment & Plan:   Principal Problem:   Constipation Active Problems:   Colon cancer metastasized to liver, lung and bone (HCC)   Acute osteomyelitis of mandible   Type 2 diabetes mellitus (HCC)   PAD (peripheral artery disease) (HCC)   Chronic pain   Frailty syndrome in geriatric patient   Chronic respiratory failure with hypoxia (HCC)   Colon cancer metastasized to liver, lung and bone (HCC) Constipation, multifactorial Patient presents with constipation and abdominal pain, possibly related to sigmoid cancer seen on CT though without evidence of obstruction Patient is on chronic narcotics so this could also be the etiology of her constipation Stool softeners, laxatives and enema if needed Patient desires no intervention or further diagnostic evaluation Was seen by palliative care on 7/16 prior to her recent discharge 7/22 bowel regimen Consult palliative care   Acute osteomyelitis of mandible Possible metastatic lesion right mandible Patient has right-sided jaw pain and swelling with leukocytosis of 27,000 and abnormal CT findings CT shows: Expanded right mandibular body with surrounding soft tissue and multifocal cortical destruction, likely indicating  active osteomyelitis with an intra osseous necrotic or purulence collection Continue Unasyn and transition to Augmentin for discharge ENT consult to follow, though based on conversation with ED provider no surgical intervention recommended due to likely malignant and infectious component Pain control 7/22 ENT consulted pending Continue unasyn. Spoke to ID Dr. Linus Salmons on call, recommended on discharge 4 weeks of augmentin with f/u with ENT or ID.   Chronic respiratory failure with Hypoxia Was discharged previously on 2L on 7/16.  Likely from lung mets and possibly underlying OSA     Frailty syndrome in geriatric patient Patient with a history of recent frequent falls resulting in injury, untreated metastatic cancer, recently hospitalized and discharged to SNF [facility] and nutritionist consult 7/22 PT consulted   Chronic pain Continue gabapentin and acetaminophen   PAD (peripheral artery disease) (  [place]) Continue pravastatin   Type 2 diabetes mellitus (HCC) Continue Jardiance and metformin and sliding scale insulin coverage         DVT prophylaxis: lovenox Code Status:full Family Communication: none Disposition Plan:  Status is: Observation The patient remains OBS appropriate and will d/c before 2 midnights.        LOS: 0 days   Time spent: 35 min    Nolberto Hanlon, MD [facility] Pager [phone]  If 7PM-7AM, please contact night-coverage 04/16/2022, 2:30 PM

## 2022-04-16 NOTE — ED Notes (Signed)
Informed RN bed assigned 

## 2022-04-16 NOTE — Consult Note (Signed)
Hematology/Oncology Consult note [facility] [phone] [phone]  Patient Care Team: Albina Billet, MD as PCP - General (Internal Medicine)   Name of the patient: April [name]  [id number]  1945/12/14    Reason for consult: Metastatic disease colon Versus lung   Requesting physician: Dr. Tami Ribas  Date of visit:  04/17/22   History of presenting illness- Patient is a 76 year old female who was admitted to the hospital in May 2023 for right humerus fracture at that time she was noted to have a left lower lobe lung mass 3 x 2.4 cm and additional bilateral lung nodules concerning for metastatic disease.  She was seen by Dr. Tasia Catchings and was recommended outpatient PET scan and possible biopsies.  However patient at that time declined any kind of intervention for her possible metastatic disease.  Patient then came back to the hospital in July 2023 after a mechanical fall but given that she did not desire any work-up for her possible malignancy outpatient palliative care referral was made  Patient now presents with symptoms of right-sided jaw pain she had a CT maxillofacial with contrast as well as CT abdomen and pelvis with contrast for abdominal pain.  CT maxillofacial showed expanded right mandibular body with surrounding soft tissue and multifocal cortical destruction indicating active osteomyelitis with an intraosseous necrotic/purulent collection CT abdomen and pelvis with contrast showed a 5.7 cm mass lesion in the sigmoid colon possibly suggesting malignant neoplasm of the sigmoid colon multiple space-occupying lesions in the liver measuring up to 7.5 cm suggesting hepatic metastatic disease possible bilateral adrenal metastatic disease lytic lesion involving the left iliac crest.  Patient was seen by ENT Dr. Tami Ribas and it was felt that what she has in the mandible is not a true infection but rather a metastatic focus.  She is presently on antibiotics for  possible superimposed infection involving the mandible but no surgical intervention was recommended based on clinical examination.  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   Colon cancer metastasized to liver, lung and bone ([place]) 04/15/2022   Acute osteomyelitis of mandible 04/15/2022   Chronic pain 04/15/2022   Constipation 04/15/2022   Frailty syndrome in geriatric patient 04/15/2022   Vitamin D deficiency 04/05/2022   UTI (urinary tract infection) 04/05/2022   Fall at home, initial encounter 04/03/2022   Frequent falls 04/03/2022   Mass of lower lobe of left lung on CT chest 01/30/22    Closed displaced fracture of surgical neck of humerus, unspecified fracture morphology, initial encounter    Acute cystitis without hematuria    Abnormal CT of the chest    Hypoxia 01/28/2022   Lymphedema 07/02/2019   S/P right hip fracture 02/05/2019   PAD (peripheral artery disease) ([place]) 10/03/2017   Essential hypertension 10/03/2017   Pain in limb 03/21/2017   Swelling of limb 10/18/2016   Ulcer of calf ([place]) 10/18/2016   Gangrene of foot ([place]) 03/03/2016   UPJ (ureteropelvic junction) obstruction    Paroxysmal atrial fibrillation ([place]) 12/04/2015   Back pain 12/04/2015   Thrombocytopenia ([place]) 12/03/2015   Leukocytosis 12/03/2015   Type 2 diabetes mellitus ([place]) 12/03/2015   Respiratory failure ([place])  Proteus septicemia ([place])    Acute renal failure (ARF) (HCC)    Hydronephrosis    Endotracheally intubated    Respiratory distress    Arterial hypotension    Sepsis ([place]) 10/20/2015   Acute renal failure (HCC)    Altered mental status    NSTEMI (non-ST elevated myocardial infarction) ([place])    Sepsis  due to urinary tract infection [facility])      Past Medical History:  Diagnosis Date   Acute renal failure (ARF) (HCC)    Difficult intubation    Hypertension    NSTEMI (non-ST elevated myocardial infarction) ([place])    Paroxysmal atrial fibrillation ([place])    Renal disorder    Sepsis [facility]) January 2017   [facility]   Type 2 diabetes mellitus [facility])      Past Surgical History:  Procedure Laterality Date   broken hip  2019   CYSTOSCOPY WITH STENT PLACEMENT Right 11/03/2015   Procedure: CYSTOSCOPY WITH STENT PLACEMENT;  Surgeon: Hollice Espy, MD;  Location: [facility] ORS;  Service: Urology;  Laterality: Right;   FRACTURE SURGERY Right    Elbow   INTRAMEDULLARY (IM) NAIL INTERTROCHANTERIC Right 02/07/2019   Procedure: INTRAMEDULLARY (IM) NAIL INTERTROCHANTRIC;  Surgeon: Thornton Park, MD;  Location: [facility] ORS;  Service: Orthopedics;  Laterality: Right;   TONSILLECTOMY     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   Procedure: TRANSMETATARSAL AMPUTATION;  Surgeon: Algernon Huxley, MD;  Location: [facility] ORS;  Service: Vascular;  Laterality: Bilateral;    Social History   Socioeconomic History   Marital status: Single    Spouse name: Not on file   Number of children: Not on file   Years of education: Not on file   Highest education level: Not on file  Occupational History   Not on file  Tobacco Use   Smoking status: Never   Smokeless tobacco: Never  Substance and Sexual Activity   Alcohol use: No    Alcohol/week: 0.0 standard drinks of alcohol   Drug use: No   Sexual activity: Not on file  Other Topics Concern   Not on file  Social History Narrative   Not on file   Social Determinants of Health   Financial Resource Strain: Not on file  Food Insecurity: Not on file  Transportation Needs: Not on file  Physical Activity: Not on file  Stress: Not on file  Social Connections: Not on file  Intimate Partner Violence: Not on file     Family History  Problem Relation Age of Onset    Diabetes Father    Heart disease Father    Stroke Maternal Grandmother      Current Facility-Administered Medications:    acetaminophen (TYLENOL) tablet 650 mg, 650 mg, Oral, Q6H PRN **OR** acetaminophen (TYLENOL) suppository 650 mg, 650 mg, Rectal, Q6H PRN, Athena Masse, MD   Ampicillin-Sulbactam (UNASYN) 3 g in sodium chloride 0.9 % 100 mL IVPB, 3 g, Intravenous, Q6H, Noralee Space, RPH, Stopped at 04/16/22 1627   chlorhexidine (PERIDEX) 0.12 % solution 10 mL, 10 mL, Mouth/Throat, QID, Beverly Gust, MD, 10 mL at 04/16/22 1716   docusate sodium (COLACE) capsule 100 mg, 100 mg, Oral, BID, Judd Gaudier V, MD, 100 mg at 04/16/22 0934   empagliflozin (JARDIANCE) tablet 10 mg, 10 mg, Oral, Daily, Judd Gaudier V, MD   enoxaparin (LOVENOX) injection 45 mg, 0.5 mg/kg, Subcutaneous, Q24H, Judd Gaudier V, MD, 45 mg at 04/16/22 0023   gabapentin (NEURONTIN) capsule 300 mg, 300 mg, Oral, TID, Athena Masse,  MD, 300 mg at 04/16/22 1556   insulin aspart (novoLOG) injection 0-15 Units, 0-15 Units, Subcutaneous, TID WC, Athena Masse, MD, 2 Units at 04/16/22 1648   insulin aspart (novoLOG) injection 0-5 Units, 0-5 Units, Subcutaneous, QHS, Athena Masse, MD   [START ON 04/17/2022] metFORMIN (GLUCOPHAGE) tablet 500 mg, 500 mg, Oral, BID, Athena Masse, MD   metoprolol tartrate (LOPRESSOR) tablet 25 mg, 25 mg, Oral, BID, Athena Masse, MD, 25 mg at 04/16/22 0934   morphine (PF) 2 MG/ML injection 2 mg, 2 mg, Intravenous, Q2H PRN, Athena Masse, MD, 2 mg at 04/16/22 1247   ondansetron (ZOFRAN) tablet 4 mg, 4 mg, Oral, Q6H PRN **OR** ondansetron (ZOFRAN) injection 4 mg, 4 mg, Intravenous, Q6H PRN, Athena Masse, MD, 4 mg at 04/16/22 0057   oxyCODONE (Oxy IR/ROXICODONE) immediate release tablet 5 mg, 5 mg, Oral, Q6H PRN, Nolberto Hanlon, MD, 5 mg at 04/16/22 1350   polyethylene glycol (MIRALAX / GLYCOLAX) packet 17 g, 17 g, Oral, BID, Kurtis Bushman, Sahar, MD   pravastatin (PRAVACHOL) tablet 40 mg,  40 mg, Oral, Daily, Judd Gaudier V, MD, 40 mg at 04/16/22 0934   sodium phosphate (FLEET) 7-19 GM/118ML enema 1 enema, 1 enema, Rectal, Daily PRN, Nolberto Hanlon, MD   Derrill Memo ON 04/17/2022] vancomycin (VANCOREADY) IVPB 1250 mg/250 mL, 1,250 mg, Intravenous, Q24H, Dallie Piles, [facility]   Physical exam:  Vitals:   04/16/22 0751 04/16/22 0833 04/16/22 1500 04/16/22 1639  BP: 129/67 (!) 145/57 (!) 162/78 (!) 162/78  Pulse: (!) 103 (!) 105 99 99  Resp: 18 16 16 16   Temp: 98.3 F (36.8 C) 97.7 F (36.5 C) 99.1 F (37.3 C) 99.1 F (37.3 C)  TempSrc: Oral  Oral Oral  SpO2: 94% 95% 96% 96%  Weight:      Height:       Physical Exam        Latest Ref Rng & Units 04/16/2022    4:02 AM  CMP  Glucose 70 - 99 mg/dL 102   BUN 8 - 23 mg/dL 13   Creatinine 0.44 - 1.00 mg/dL 0.50   Sodium 135 - 145 mmol/L 136   Potassium 3.5 - 5.1 mmol/L 4.3   Chloride 98 - 111 mmol/L 97   CO2 22 - 32 mmol/L 27   Calcium 8.9 - 10.3 mg/dL 9.2       Latest Ref Rng & Units 04/16/2022    4:02 AM  CBC  WBC 4.0 - 10.5 K/uL 28.4   Hemoglobin 12.0 - 15.0 g/dL 14.0   Hematocrit 36.0 - 46.0 % 45.0   Platelets 150 - 400 K/uL 457     @IMAGES @  CT ABDOMEN PELVIS W CONTRAST  Result Date: 04/15/2022 CLINICAL DATA:  Abdominal pain and distention EXAM: CT ABDOMEN AND PELVIS WITH CONTRAST TECHNIQUE: Multidetector CT imaging of the abdomen and pelvis was performed using the standard protocol following bolus administration of intravenous contrast. RADIATION DOSE REDUCTION: This exam was performed according to the departmental dose-optimization program which includes automated exposure control, adjustment of the mA and/or kV according to patient size and/or use of iterative reconstruction technique. CONTRAST:  167mL OMNIPAQUE IOHEXOL 300 MG/ML  SOLN COMPARISON:  Previous studies including the CT abdomen and pelvis done on 01/05/2016 and CT chest done on 01/29/2022 FINDINGS: Lower chest: There are multiple nodules of varying  sizes in the lower lung fields. There is a 3.2 cm nodule/mass in the left lower lobe. There are small patchy infiltrates in both  lower lobes. Minimal bilateral pleural effusions are seen. Dense calcification is seen in mitral annulus. Hepatobiliary: There is interval appearance of multiple low-density space-occupying lesions in liver largest measuring 7.5 x 4.1 cm in the anterior aspect of the liver. There is no dilation of bile ducts. There is increased density in the dependent portion of gallbladder suggesting gallbladder stones. Pancreas: There is atrophy.  No focal abnormalities are seen. Spleen: Unremarkable. Adrenals/Urinary Tract: There is 11 mm nodule in left adrenal. There are small nodular densities in right adrenal measuring up to 11 mm. There is no hydronephrosis. Lobulations are seen in the margins of the kidneys, possibly suggesting scarring. There is 17 mm calculus in the upper pole of right kidney. There is 12 mm calculus in the midportion. There is 12 mm calculus in the lower pole. Ureters are not dilated. Urinary bladder is distended. There is air in the bladder, possibly suggesting recent instrumentation. Diverticulum is noted in the left posterior aspect of the urinary bladder. Stomach/Bowel: Small hiatal hernia is seen. Stomach is not distended. Small bowel loops not dilated. Appendix is not dilated. Scattered diverticula seen in colon without signs of focal acute diverticulitis. There is wall thickening in 5.7 cm segment of sigmoid colon in midline. Oral contrast has reached the rectum. There is large amount of stool in rectum. Transverse diameter of rectum measures 9.4 cm. There is no significant wall thickening in rectum. Vascular/Lymphatic: Scattered arterial calcifications are seen. Reproductive: There is low-density in the central portion of the uterus. There is 2.2 cm fluid density structure in the right adnexa. Other: There is no ascites or pneumoperitoneum. Umbilical hernia containing  fat is seen. Musculoskeletal: There is previous internal fixation in right femur with intramedullary rod. Degenerative changes are noted in right hip. Degenerative changes are noted in lower thoracic spine and lumbar spine. There is fracture in the upper endplate of body of L3 vertebra which may be recent or old. Decrease in height of multiple thoracic vertebral bodies may suggest recent or old compression fractures. There is inhomogeneous in attenuation in the bony structures this may be due to osteoporosis or infiltrative neoplastic process. There is a lytic lesion with adjacent soft tissue mass in the posterior aspect of left iliac crest. IMPRESSION: There is 5.7 cm mass lesion in sigmoid colon suggesting possible primary malignant neoplasm. There is no evidence of intestinal obstruction or pneumoperitoneum. There is no hydronephrosis. There are multiple nodules of varying sizes in the visualized lower lung fields consistent with pulmonary metastatic disease. Patchy infiltrates in the lower lung fields may suggest atelectasis/pneumonia. Small bilateral pleural effusions. There are multiple space-occupying lesions of varying sizes in the liver measuring up to 7.5 cm in size suggesting hepatic metastatic disease. There are few nodular densities in both adrenals suggesting possible metastatic disease. There is inhomogeneous attenuation in the bony structures suggesting possible skeletal metastatic disease. There is a lytic lesion in the posterior medial aspect of left iliac crest consistent with skeletal metastatic disease. Other findings as described in the body of the report. Electronically Signed   By: Elmer Picker M.D.   On: 04/15/2022 20:16   CT Maxillofacial W Contrast  Result Date: 04/15/2022 CLINICAL DATA:  Facial abscess EXAM: CT MAXILLOFACIAL WITH CONTRAST TECHNIQUE: Multidetector CT imaging of the maxillofacial structures was performed with intravenous contrast. Multiplanar CT image  reconstructions were also generated. RADIATION DOSE REDUCTION: This exam was performed according to the departmental dose-optimization program which includes automated exposure control, adjustment of the mA and/or kV according  to patient size and/or use of iterative reconstruction technique. CONTRAST:  139mL OMNIPAQUE IOHEXOL 300 MG/ML  SOLN COMPARISON:  None Available. FINDINGS: Osseous: The right mandibular body is expanded and hyperlucent with the abnormal area encompassing the roots of the right mandibular molars. There is multifocal cortical destruction. There is a surrounding soft tissue component Orbits: Negative. No traumatic or inflammatory finding. Sinuses: Clear. Soft tissues: Soft tissue swelling surrounding the right mandibular body. Limited intracranial: No significant or unexpected finding. 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 Clinical follow-up after resolution of symptoms is recommended to exclude a superimposed neoplastic process. Electronically Signed   By: Ulyses Jarred M.D.   On: 04/15/2022 20:04   DG Abdomen 1 View  Result Date: 04/15/2022 CLINICAL DATA:  Constipation.  Abdominal distension. EXAM: ABDOMEN - 1 VIEW COMPARISON:  04/09/22 FINDINGS: Gallstones are identified. There is been interval decrease in previously noted air-filled loops of small bowel within the left hemiabdomen. Progressive accumulation of stool is identified throughout the colon and rectum. Large volume of desiccated stool within the rectum has increased in volume compared with the previous study. Cannot rule out rectal impaction. No signs of pneumoperitoneum. Lumbar scoliosis and degenerative disc disease. Status post ORIF of the right femur. Marked degenerative disc disease is noted involving the right hip. Moderate degenerative disc disease noted in the  left hip. IMPRESSION: 1. Progressive accumulation of stool throughout the colon and rectum compatible with constipation. Cannot rule out rectal impaction. 2. Interval decrease in previously noted air-filled loops of small bowel within the left hemiabdomen. 3. Gallstones. Electronically Signed   By: Kerby Moors M.D.   On: 04/15/2022 16:13   DG Abd 1 View  Result Date: 04/09/2022 CLINICAL DATA:  Constipation. EXAM: ABDOMEN - 1 VIEW COMPARISON:  Pelvis radiograph 04/02/2022 FINDINGS: Prominent stool noted at the rectum. Mild gaseous distension above this point. No definite free air. Calcifications in the right abdomen likely gallstones. IMPRESSION: 1. Prominent stool at the rectum with mild gaseous distension above this point. 2. Cholelithiasis. Electronically Signed   By: San Morelle M.D.   On: 04/09/2022 12:27   DG Chest Portable 1 View  Result Date: 04/02/2022 CLINICAL DATA:  Recent fall with right-sided pain, initial encounter EXAM: PORTABLE CHEST 1 VIEW COMPARISON:  01/28/2022 FINDINGS: Cardiac shadow is within normal limits. Aortic calcifications are again seen. Increased vascular congestion and mild edema is noted. No focal confluent infiltrate is seen. Old rib fractures are noted bilaterally with healing. IMPRESSION: No acute abnormality noted. Electronically Signed   By: Inez Catalina M.D.   On: 04/02/2022 21:50   DG Knee 2 Views Right  Result Date: 04/02/2022 CLINICAL DATA:  Right leg pain following fall, initial encounter EXAM: RIGHT KNEE - 2 VIEW COMPARISON:  None Available. FINDINGS: Postsurgical changes in the distal femur are noted. No acute fracture is noted. IMPRESSION: No acute abnormality noted. Electronically Signed   By: Inez Catalina M.D.   On: 04/02/2022 21:49   DG Hip Unilat W or Wo Pelvis 2-3 Views Right  Result Date: 04/02/2022 CLINICAL DATA:  Right-sided hip pain following fall several hours ago, initial encounter EXAM: DG HIP (WITH OR WITHOUT PELVIS) 2-3V RIGHT  COMPARISON:  02/07/2019 FINDINGS: Pelvic ring is intact. Postsurgical changes in the proximal right femur are noted. Degenerative changes of the right hip joint are noted. No acute fracture or dislocation is seen. No soft tissue  changes are noted. IMPRESSION: Degenerative change in the right hip joint. No acute abnormality is noted. Electronically Signed   By: Inez Catalina M.D.   On: 04/02/2022 21:48    Assessment and plan- Patient is a 76 y.o. female ***   Thank you for this kind referral and the opportunity to participate in the care of this  Patient   Visit Diagnosis 1. Constipation, unspecified constipation type   2. Colonic mass   3. Multiple lesions of metastatic malignancy ([place])   4. Acute hematogenous osteomyelitis of other site [facility])     Dr. Randa Evens, MD, MPH [facility] [phone] 04/16/2022

## 2022-04-16 NOTE — Progress Notes (Signed)
PHARMACY - PHYSICIAN COMMUNICATION CRITICAL VALUE ALERT - BLOOD CULTURE IDENTIFICATION (BCID)  April [name] is an 76 y.o. female w/ PMH of DM, HTN, PVD recently hospitalized from 7/8 to 7/16 for frequent falls and discharged to SNF, Klebsiella UTI, CRC metastasized to liver, lung, bone  admitted on 04/15/2022 with osteomyelitis of mandible  Assessment:  1/4 S epidermidis mec A/C+  Name of physician Contacted: Lupita Dawn, MD  Current antibiotics: Unasyn  Changes to prescribed antibiotics recommended: add vancomycin   No results found for this or any previous visit.  Dallie Piles 04/16/2022  2:51 PM

## 2022-04-16 NOTE — Progress Notes (Signed)
Pharmacy Antibiotic Note  April [name] is a 76 y.o. female admitted on 04/15/2022 with  jaw infection .  Pharmacy has been consulted for unasyn dosing.  Plan: Unasyn 3 gm IV q6h   Height: 5\' 6"  (167.6 cm) Weight: 91.4 kg (201 lb 8 oz) IBW/kg (Calculated) : 59.3  Temp (24hrs), Avg:98.2 F (36.8 C), Min:98.1 F (36.7 C), Max:98.2 F (36.8 C)  Recent Labs  Lab 04/10/22 0547 04/15/22 1442 04/15/22 1911  WBC  --  27.6*  --   CREATININE 0.42* 0.41*  --   LATICACIDVEN  --   --  1.3    Estimated Creatinine Clearance: 69.2 mL/min (A) (by C-G formula based on SCr of 0.41 mg/dL (L)).    Allergies  Allergen Reactions   Prednisone Anaphylaxis   Other     Antimicrobials this admission: Unasyn 7/21   >>       Dose adjustments this admission:    Microbiology results: 7/21 BCx: pend   UCx:      Sputum:      MRSA PCR:    Thank you for allowing pharmacy to be a part of this patient's care.  April [name] A 04/16/2022 12:36 AM

## 2022-04-16 NOTE — Plan of Care (Signed)

## 2022-04-16 NOTE — Progress Notes (Signed)
Pharmacy Antibiotic Note  April [name] is a 76 y.o. female w/ PMH of DM, HTN, PVD recently hospitalized from 7/8 to 7/16 for frequent falls and discharged to SNF, Klebsiella UTI, CRC metastasized to liver, lung, bone  admitted on 04/15/2022 with  jaw infection .  Pharmacy has been consulted for unasyn and vancomycin dosing.  Plan:  1) continue Unasyn 3 grams IV every 6 hours  2) start vancomycin 1500 mg IV x 1 then 1250 mg IV every 24 hours Goal AUC 400-550. Expected AUC: 530 SCr used: 0.80 mg/dL (rounded up) Ke 0.052 h-1, T1/2 13.4 h   Height: 5\' 6"  (167.6 cm) Weight: 91.4 kg (201 lb 8 oz) IBW/kg (Calculated) : 59.3  Temp (24hrs), Avg:98.3 F (36.8 C), Min:97.7 F (36.5 C), Max:99 F (37.2 C)  Recent Labs  Lab 04/10/22 0547 04/15/22 1442 04/15/22 1911 04/16/22 0402  WBC  --  27.6*  --  28.4*  CREATININE 0.42* 0.41*  --  0.50  LATICACIDVEN  --   --  1.3  --      Estimated Creatinine Clearance: 69.2 mL/min (by C-G formula based on SCr of 0.5 mg/dL).    Allergies  Allergen Reactions   Prednisone Anaphylaxis   Other     Antimicrobials this admission: Unasyn 7/21   >>   vancomycin  7/22 >>   Microbiology results: 7/21 BCx: 1/4 S epidermidis MecA/C+  Thank you for allowing pharmacy to be a part of this patient's care.  Dallie Piles 04/16/2022 3:17 PM

## 2022-04-17 DIAGNOSIS — I10 Essential (primary) hypertension: Secondary | ICD-10-CM

## 2022-04-17 DIAGNOSIS — C7951 Secondary malignant neoplasm of bone: Secondary | ICD-10-CM | POA: Diagnosis not present

## 2022-04-17 DIAGNOSIS — G8929 Other chronic pain: Secondary | ICD-10-CM | POA: Diagnosis not present

## 2022-04-17 DIAGNOSIS — Z7189 Other specified counseling: Secondary | ICD-10-CM

## 2022-04-17 DIAGNOSIS — M272 Inflammatory conditions of jaws: Secondary | ICD-10-CM | POA: Diagnosis not present

## 2022-04-17 DIAGNOSIS — K5900 Constipation, unspecified: Secondary | ICD-10-CM | POA: Diagnosis not present

## 2022-04-17 DIAGNOSIS — K6389 Other specified diseases of intestine: Secondary | ICD-10-CM

## 2022-04-17 DIAGNOSIS — C801 Malignant (primary) neoplasm, unspecified: Secondary | ICD-10-CM | POA: Diagnosis not present

## 2022-04-17 DIAGNOSIS — C7800 Secondary malignant neoplasm of unspecified lung: Secondary | ICD-10-CM | POA: Diagnosis not present

## 2022-04-17 DIAGNOSIS — D374 Neoplasm of uncertain behavior of colon: Secondary | ICD-10-CM

## 2022-04-17 DIAGNOSIS — J9611 Chronic respiratory failure with hypoxia: Secondary | ICD-10-CM | POA: Diagnosis not present

## 2022-04-17 LAB — CBC
HCT: 45.8 % (ref 36.0–46.0)
Hemoglobin: 14.1 g/dL (ref 12.0–15.0)
MCH: 27.3 pg (ref 26.0–34.0)
MCHC: 30.8 g/dL (ref 30.0–36.0)
MCV: 88.8 fL (ref 80.0–100.0)
Platelets: 426 10*3/uL — ABNORMAL HIGH (ref 150–400)
RBC: 5.16 MIL/uL — ABNORMAL HIGH (ref 3.87–5.11)
RDW: 16.6 % — ABNORMAL HIGH (ref 11.5–15.5)
WBC: 25.4 10*3/uL — ABNORMAL HIGH (ref 4.0–10.5)
nRBC: 0 % (ref 0.0–0.2)

## 2022-04-17 LAB — CREATININE, SERUM
Creatinine, Ser: 0.49 mg/dL (ref 0.44–1.00)
GFR, Estimated: >60 mL/min (ref >60)

## 2022-04-17 LAB — GLUCOSE, CAPILLARY
Glucose-Capillary: 120 mg/dL — ABNORMAL HIGH (ref 70–99)
Glucose-Capillary: 168 mg/dL — ABNORMAL HIGH (ref 70–99)
Glucose-Capillary: 141 mg/dL — ABNORMAL HIGH (ref 70–99)
Glucose-Capillary: 115 mg/dL — ABNORMAL HIGH (ref 70–99)

## 2022-04-17 LAB — LACTIC ACID, PLASMA: Lactic Acid, Venous: 1.7 mmol/L (ref 0.5–1.9)

## 2022-04-17 MED ORDER — BISACODYL 10 MG RE SUPP
10.0000 mg | Freq: Once | RECTAL | Status: DC
Start: 2022-04-17 — End: 2022-04-22

## 2022-04-17 MED ORDER — AMLODIPINE BESYLATE 5 MG PO TABS
5.0000 mg | ORAL_TABLET | Freq: Every day | ORAL | Status: DC
  Administered 2022-04-17 – 2022-04-23 (×6): 5 mg via ORAL
  Filled 2022-04-17 (×7): qty 1

## 2022-04-17 NOTE — Progress Notes (Signed)
PROGRESS NOTE    April [name]  BZJ:[id number] DOB: 1946-09-01 DOA: 04/15/2022 PCP: Albina Billet, MD    Brief Narrative:  April [name] is a 76 y.o. female with medical history significant for Type II DM, HTN, PVD, recently hospitalized from 7/8 to 7/16 for frequent falls and discharged to SNF, who at that time had chronic leukocytosis of unknown etiology but found to have Klebsiella UTI for which she was treated and who at the same time had a known lung mass seen on CT 01/30/2022 for which she declined treatment or further investigation, evaluated by palliative care prior to discharge, who now returns to the ED with a complaint of abdominal pain, constipation, and pain all over, including pain on her right jaw and ' something going on with her tooth'.  She states her last bowel movement was 2 to 3 weeks prior and she has pain with urination.  She denies fever or chills or cough or shortness of breath. ED course and data review: In the ED intermittently tachycardic to 101.  O2 sat intermittently 90-94 for which she was placed on nasal cannula 2 to 3 L.  Vitals otherwise unremarkable Labs notable for WBC of 27,000 with lactic acid 1.3, up from an high baseline of 19-21,000 during her hospitalization a couple weeks prior.  Platelets 440 and alk phos 561.  The emergency provider spoke with ENT specialist, Dr. Tami Ribas who advised that infection on mandible is possibly related to underlying malignancy so any surgical intervention would likely worsen outcome and thus recommended antibiotic therapy only.  Recommended oral Augmentin, according to discussion with ED provider  7/22 no overnight issues. Oncology was consulted  Consultants:  Oncology , ENT  Procedures:  CT a/p There is 5.7 cm mass lesion in sigmoid colon suggesting possible primary malignant neoplasm.  There is no evidence of intestinal obstruction or pneumoperitoneum. There is no hydronephrosis.  There are multiple nodules of varying  sizes in the visualized lower lung fields consistent with pulmonary metastatic disease. Patchy infiltrates in the lower lung fields may suggest atelectasis/pneumonia. Small bilateral pleural effusions.  There are multiple space-occupying lesions of varying sizes in the liver measuring up to 7.5 cm in size suggesting hepatic metastatic disease.  There are few nodular densities in both adrenals suggesting possible metastatic disease.  There is inhomogeneous attenuation in the bony structures suggesting possible skeletal metastatic disease. There is a lytic lesion in the posterior medial aspect of left iliac crest consistent with skeletal metastatic disease.  Other findings as described in the body of the report.  CT max Expanded right mandibular body with surrounding soft tissue and multifocal cortical destruction, likely indicating active osteomyelitis with an intra osseous necrotic or purulence collection. Clinical follow-up after resolution of symptoms is recommended to exclude a superimposed neoplastic process.     Antimicrobials:  Unasyn   Subjective: No jaw pain. No BM  Objective: Vitals:   04/16/22 1639 04/16/22 2030 04/17/22 0438 04/17/22 0759  BP: (!) 162/78 (!) 147/67 (!) 141/74 (!) 155/67  Pulse: 99 96 86 (!) 102  Resp: _0 Temp: 99.1 F (37.3 C) 98.4 F (36.9 C) 98.1 F (36.7 C) 97.8 F (36.6 C)  TempSrc: Oral Oral Oral   SpO2: 96% 96%  97%  Weight:      Height:        Intake/Output Summary (Last 24 hours) at 04/17/2022 0848 Last data filed at 04/17/2022 0322 Gross per 24 hour  Intake 345.82 ml  Output 250 ml  Net 95.82 ml   Filed Weights   04/15/22 1441  Weight: 91.4 kg    Examination: Calm, NAD HEENT: rt jaw swelling, nontender Decrease bs, no wheezing Reg s1/s2 no gallop Soft benign +bs No edema Awake and alert Mood and affect appropriate in current setting     Data Reviewed: I have personally reviewed following labs and  imaging studies  CBC: Recent Labs  Lab 04/15/22 1442 04/16/22 0402  WBC 27.6* 28.4*  HGB 14.4 14.0  HCT 46.2* 45.0  MCV 85.4 87.2  PLT 440* 341*   Basic Metabolic Panel: Recent Labs  Lab 04/15/22 1442 04/16/22 0402 04/17/22 0451  NA 132* 136  --   K 3.5 4.3  --   CL 96* 97*  --   CO2 27 27  --   GLUCOSE 98 102*  --   BUN 14 13  --   CREATININE 0.41* 0.50 0.49  CALCIUM 9.0 9.2  --    GFR: Estimated Creatinine Clearance: 69.2 mL/min (by C-G formula based on SCr of 0.49 mg/dL). Liver Function Tests: Recent Labs  Lab 04/15/22 1442  AST 48*  ALT 15  ALKPHOS 561*  BILITOT 1.2  PROT 6.9  ALBUMIN 2.4*   Recent Labs  Lab 04/15/22 1442  LIPASE 24   No results for input(s): "AMMONIA" in the last 168 hours. Coagulation Profile: No results for input(s): "INR", "PROTIME" in the last 168 hours. Cardiac Enzymes: No results for input(s): "CKTOTAL", "CKMB", "CKMBINDEX", "TROPONINI" in the last 168 hours. BNP (last 3 results) No results for input(s): "PROBNP" in the last 8760 hours. HbA1C: No results for input(s): "HGBA1C" in the last 72 hours. CBG: Recent Labs  Lab 04/16/22 0727 04/16/22 1112 04/16/22 1621 04/16/22 2223 04/17/22 0724  GLUCAP 104* 126* 149* 142* 120*   Lipid Profile: No results for input(s): "CHOL", "HDL", "LDLCALC", "TRIG", "CHOLHDL", "LDLDIRECT" in the last 72 hours. Thyroid Function Tests: No results for input(s): "TSH", "T4TOTAL", "FREET4", "T3FREE", "THYROIDAB" in the last 72 hours. Anemia Panel: No results for input(s): "VITAMINB12", "FOLATE", "FERRITIN", "TIBC", "IRON", "RETICCTPCT" in the last 72 hours. Sepsis Labs: Recent Labs  Lab 04/15/22 1911 04/17/22 0451  LATICACIDVEN 1.3 1.7    Recent Results (from the past 240 hour(s))  Blood culture (routine x 2)     Status: None (Preliminary result)   Collection Time: 04/15/22  7:11 PM   Specimen: BLOOD LEFT WRIST  Result Value Ref Range Status   Specimen Description   Final    BLOOD  LEFT WRIST Performed at [facility], [address], [place], [place] [address]    Special Requests   Final    BOTTLES DRAWN AEROBIC AND ANAEROBIC Blood Culture adequate volume Performed at [facility], [address] [place], [place] [address]    Culture  Setup Time   Final    GRAM POSITIVE COCCI ANAEROBIC BOTTLE ONLY Organism ID to follow CRITICAL RESULT CALLED TO, READ BACK BY AND VERIFIED WITH: RODNEY GRUBB 04/16/22 1451 DE    Culture GRAM POSITIVE COCCI  Final   Report Status PENDING  Incomplete  Blood Culture ID Panel (Reflexed)     Status: Abnormal   Collection Time: 04/15/22  7:11 PM  Result Value Ref Range Status   Enterococcus faecalis NOT DETECTED NOT DETECTED Final   Enterococcus Faecium NOT DETECTED NOT DETECTED Final   Listeria monocytogenes NOT DETECTED NOT DETECTED Final   Staphylococcus species DETECTED (A) NOT DETECTED Final    Comment: CRITICAL  RESULT CALLED TO, READ BACK BY AND VERIFIED WITH: RODNEY GRUBB 04/16/22 1451 DE    Staphylococcus aureus (BCID) NOT DETECTED NOT DETECTED Final   Staphylococcus epidermidis DETECTED (A) NOT DETECTED Final    Comment: Methicillin (oxacillin) resistant coagulase negative staphylococcus. Possible blood culture contaminant (unless isolated from more than one blood culture draw or clinical case suggests pathogenicity). No antibiotic treatment is indicated for blood  culture contaminants. CRITICAL RESULT CALLED TO, READ BACK BY AND VERIFIED WITH: RODNEY GRUBB 04/16/22 1451 DE    Staphylococcus lugdunensis NOT DETECTED NOT DETECTED Final   Streptococcus species NOT DETECTED NOT DETECTED Final   Streptococcus agalactiae NOT DETECTED NOT DETECTED Final   Streptococcus pneumoniae NOT DETECTED NOT DETECTED Final   Streptococcus pyogenes NOT DETECTED NOT DETECTED Final   A.calcoaceticus-baumannii NOT DETECTED NOT DETECTED Final   Bacteroides fragilis NOT DETECTED NOT DETECTED Final   Enterobacterales NOT  DETECTED NOT DETECTED Final   Enterobacter cloacae complex NOT DETECTED NOT DETECTED Final   Escherichia coli NOT DETECTED NOT DETECTED Final   Klebsiella aerogenes NOT DETECTED NOT DETECTED Final   Klebsiella oxytoca NOT DETECTED NOT DETECTED Final   Klebsiella pneumoniae NOT DETECTED NOT DETECTED Final   Proteus species NOT DETECTED NOT DETECTED Final   Salmonella species NOT DETECTED NOT DETECTED Final   Serratia marcescens NOT DETECTED NOT DETECTED Final   Haemophilus influenzae NOT DETECTED NOT DETECTED Final   Neisseria meningitidis NOT DETECTED NOT DETECTED Final   Pseudomonas aeruginosa NOT DETECTED NOT DETECTED Final   Stenotrophomonas maltophilia NOT DETECTED NOT DETECTED Final   Candida albicans NOT DETECTED NOT DETECTED Final   Candida auris NOT DETECTED NOT DETECTED Final   Candida glabrata NOT DETECTED NOT DETECTED Final   Candida krusei NOT DETECTED NOT DETECTED Final   Candida parapsilosis NOT DETECTED NOT DETECTED Final   Candida tropicalis NOT DETECTED NOT DETECTED Final   Cryptococcus neoformans/gattii NOT DETECTED NOT DETECTED Final   Methicillin resistance mecA/C DETECTED (A) NOT DETECTED Final    Comment: Performed at [facility], [address], [place], [place] [address]         Radiology Studies: CT ABDOMEN PELVIS W CONTRAST  Result Date: 04/15/2022 CLINICAL DATA:  Abdominal pain and distention EXAM: CT ABDOMEN AND PELVIS WITH CONTRAST TECHNIQUE: Multidetector CT imaging of the abdomen and pelvis was performed using the standard protocol following bolus administration of intravenous contrast. RADIATION DOSE REDUCTION: This exam was performed according to the departmental dose-optimization program which includes automated exposure control, adjustment of the mA and/or kV according to patient size and/or use of iterative reconstruction technique. CONTRAST:  157m OMNIPAQUE IOHEXOL 300 MG/ML  SOLN COMPARISON:  Previous studies including the CT abdomen  and pelvis done on 01/05/2016 and CT chest done on 01/29/2022 FINDINGS: Lower chest: There are multiple nodules of varying sizes in the lower lung fields. There is a 3.2 cm nodule/mass in the left lower lobe. There are small patchy infiltrates in both lower lobes. Minimal bilateral pleural effusions are seen. Dense calcification is seen in mitral annulus. Hepatobiliary: There is interval appearance of multiple low-density space-occupying lesions in liver largest measuring 7.5 x 4.1 cm in the anterior aspect of the liver. There is no dilation of bile ducts. There is increased density in the dependent portion of gallbladder suggesting gallbladder stones. Pancreas: There is atrophy.  No focal abnormalities are seen. Spleen: Unremarkable. Adrenals/Urinary Tract: There is 11 mm nodule in left adrenal. There are small nodular densities in right adrenal measuring up to  11 mm. There is no hydronephrosis. Lobulations are seen in the margins of the kidneys, possibly suggesting scarring. There is 17 mm calculus in the upper pole of right kidney. There is 12 mm calculus in the midportion. There is 12 mm calculus in the lower pole. Ureters are not dilated. Urinary bladder is distended. There is air in the bladder, possibly suggesting recent instrumentation. Diverticulum is noted in the left posterior aspect of the urinary bladder. Stomach/Bowel: Small hiatal hernia is seen. Stomach is not distended. Small bowel loops not dilated. Appendix is not dilated. Scattered diverticula seen in colon without signs of focal acute diverticulitis. There is wall thickening in 5.7 cm segment of sigmoid colon in midline. Oral contrast has reached the rectum. There is large amount of stool in rectum. Transverse diameter of rectum measures 9.4 cm. There is no significant wall thickening in rectum. Vascular/Lymphatic: Scattered arterial calcifications are seen. Reproductive: There is low-density in the central portion of the uterus. There is 2.2 cm  fluid density structure in the right adnexa. Other: There is no ascites or pneumoperitoneum. Umbilical hernia containing fat is seen. Musculoskeletal: There is previous internal fixation in right femur with intramedullary rod. Degenerative changes are noted in right hip. Degenerative changes are noted in lower thoracic spine and lumbar spine. There is fracture in the upper endplate of body of L3 vertebra which may be recent or old. Decrease in height of multiple thoracic vertebral bodies may suggest recent or old compression fractures. There is inhomogeneous in attenuation in the bony structures this may be due to osteoporosis or infiltrative neoplastic process. There is a lytic lesion with adjacent soft tissue mass in the posterior aspect of left iliac crest. IMPRESSION: There is 5.7 cm mass lesion in sigmoid colon suggesting possible primary malignant neoplasm. There is no evidence of intestinal obstruction or pneumoperitoneum. There is no hydronephrosis. There are multiple nodules of varying sizes in the visualized lower lung fields consistent with pulmonary metastatic disease. Patchy infiltrates in the lower lung fields may suggest atelectasis/pneumonia. Small bilateral pleural effusions. There are multiple space-occupying lesions of varying sizes in the liver measuring up to 7.5 cm in size suggesting hepatic metastatic disease. There are few nodular densities in both adrenals suggesting possible metastatic disease. There is inhomogeneous attenuation in the bony structures suggesting possible skeletal metastatic disease. There is a lytic lesion in the posterior medial aspect of left iliac crest consistent with skeletal metastatic disease. Other findings as described in the body of the report. Electronically Signed   By: Elmer Picker M.D.   On: 04/15/2022 20:16   CT Maxillofacial W Contrast  Result Date: 04/15/2022 CLINICAL DATA:  Facial abscess EXAM: CT MAXILLOFACIAL WITH CONTRAST TECHNIQUE:  Multidetector CT imaging of the maxillofacial structures was performed with intravenous contrast. Multiplanar CT image reconstructions were also generated. RADIATION DOSE REDUCTION: This exam was performed according to the departmental dose-optimization program which includes automated exposure control, adjustment of the mA and/or kV according to patient size and/or use of iterative reconstruction technique. CONTRAST:  127m OMNIPAQUE IOHEXOL 300 MG/ML  SOLN COMPARISON:  None Available. FINDINGS: Osseous: The right mandibular body is expanded and hyperlucent with the abnormal area encompassing the roots of the right mandibular molars. There is multifocal cortical destruction. There is a surrounding soft tissue component Orbits: Negative. No traumatic or inflammatory finding. Sinuses: Clear. Soft tissues: Soft tissue swelling surrounding the right mandibular body. Limited intracranial: No significant or unexpected finding. IMPRESSION: Expanded right mandibular body with surrounding soft tissue and multifocal  cortical destruction, likely indicating active osteomyelitis with an intra osseous necrotic or purulence collection. Clinical follow-up after resolution of symptoms is recommended to exclude a superimposed neoplastic process. Electronically Signed   By: Ulyses Jarred M.D.   On: 04/15/2022 20:04   DG Abdomen 1 View  Result Date: 04/15/2022 CLINICAL DATA:  Constipation.  Abdominal distension. EXAM: ABDOMEN - 1 VIEW COMPARISON:  04/09/22 FINDINGS: Gallstones are identified. There is been interval decrease in previously noted air-filled loops of small bowel within the left hemiabdomen. Progressive accumulation of stool is identified throughout the colon and rectum. Large volume of desiccated stool within the rectum has increased in volume compared with the previous study. Cannot rule out rectal impaction. No signs of pneumoperitoneum. Lumbar scoliosis and degenerative disc disease. Status post ORIF of the right  femur. Marked degenerative disc disease is noted involving the right hip. Moderate degenerative disc disease noted in the left hip. IMPRESSION: 1. Progressive accumulation of stool throughout the colon and rectum compatible with constipation. Cannot rule out rectal impaction. 2. Interval decrease in previously noted air-filled loops of small bowel within the left hemiabdomen. 3. Gallstones. Electronically Signed   By: Kerby Moors M.D.   On: 04/15/2022 16:13        Scheduled Meds:  amLODipine  5 mg Oral Daily   bisacodyl  10 mg Rectal Once   chlorhexidine  10 mL Mouth/Throat QID   docusate sodium  100 mg Oral BID   empagliflozin  10 mg Oral Daily   enoxaparin (LOVENOX) injection  0.5 mg/kg Subcutaneous Q24H   gabapentin  300 mg Oral TID   insulin aspart  0-15 Units Subcutaneous TID WC   insulin aspart  0-5 Units Subcutaneous QHS   metFORMIN  500 mg Oral BID   metoprolol tartrate  25 mg Oral BID   polyethylene glycol  17 g Oral BID   pravastatin  40 mg Oral Daily   Continuous Infusions:  ampicillin-sulbactam (UNASYN) IV 3 g (04/17/22 0322)   vancomycin 1,250 mg (04/17/22 0725)    Assessment & Plan:   Principal Problem:   Constipation Active Problems:   Colon cancer metastasized to liver, lung and bone ([place])   Acute osteomyelitis of mandible   Type 2 diabetes mellitus (HCC)   PAD (peripheral artery disease) (HCC)   Essential hypertension   Chronic pain   Frailty syndrome in geriatric patient   Chronic respiratory failure with hypoxia ([place])   Osteomyelitis of mandible   Colon cancer metastasized to liver, lung and bone (HCC) Constipation, multifactorial Patient presents with constipation and abdominal pain, possibly related to sigmoid cancer seen on CT though without evidence of obstruction Patient is on chronic narcotics so this could also be the etiology of her constipation Stool softeners, laxatives and enema if needed Patient desires no intervention or further  diagnostic evaluation Was seen by palliative care on 7/16 prior to her recent discharge 7/23 no bm yet. continue bowel regimen .  Bisocodyl suppository x1 Oncology consulted to discuss with pt-pending   Acute osteomyelitis of mandible Possible metastatic lesion right mandible Patient has right-sided jaw pain and swelling with leukocytosis of 27,000 and abnormal CT findings CT shows: Expanded right mandibular body with surrounding soft tissue and multifocal cortical destruction, likely indicating active osteomyelitis with an intra osseous necrotic or purulence collection Continue Unasyn and transition to Augmentin for discharge ENT consult to follow, though based on conversation with ED provider no surgical intervention recommended due to likely malignant and infectious component Pain control Continue  unasyn. Spoke to ID Dr. Linus Salmons on call, recommended on discharge 4 weeks of augmentin with f/u with ENT or ID. 7/23 ENT input appreciated. Neoplasm uncertain behavior mandible with 2/2 infection. No acute abscess per ENT. No drainage , likely some dead necrotic tissue with 2/2ary infection.  Peridex mouth rinse  Abx iv for now. ?ENT bx possibly if needed by oncology     Chronic respiratory failure with Hypoxia Was discharged previously on 2L on 7/16.  Likely from lung mets and possibly underlying OSA 7/23 continue 02 supplementation keeping 02 sat 92 and above     Frailty syndrome in geriatric patient Patient with a history of recent frequent falls resulting in injury, untreated metastatic cancer, recently hospitalized and discharged to SNF [facility] and nutritionist consult 7/23 PT consult-may need placement  Essential HTN Bp has room for improvement Add amlodipine 39m qd Continue beta blk   Chronic pain Continue gabapentin and acetaminophen   PAD (peripheral artery disease) ([place] Continue pravastatin   Type 2 diabetes mellitus ([place] Continue Jardiance and metformin and sliding  scale insulin coverage         DVT prophylaxis: lovenox Code Status:full Family Communication: none Disposition Plan:  Status is: inpatient The patient remains inpatient due to need of pain mx, w/u pending , and iv treatment        LOS: 1 day   Time spent: 35 min    SNolberto Hanlon MD [facility] Pager [phone]  If 7PM-7AM, please contact night-coverage 04/17/2022, 8:48 AM

## 2022-04-17 NOTE — Evaluation (Signed)
Physical Therapy Evaluation Patient Details Name: April [name] MRN: [id number] DOB: [birth year] Today's Date: 04/17/2022  History of Present Illness  April [name] is an 76 y.o. 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  Clinical Impression  The pt presents this session with c/o back pain and immobility. The pt is agreeable to PT. She requires Mod A to achieve EOB with limited safety awareness. She reports increased pain while sitting at EOB as requires Max A to return to supine. The pt was unable to further progress her functional mobility and is not demonstrating to return home. At this time the pt will require STR in order to optimize functional mobility and progress to completion of transfers and gait training. PT will continue to follow while in-house.        Recommendations for follow up therapy are one component of a multi-disciplinary discharge planning process, led by the attending physician.  Recommendations may be updated based on patient status, additional functional criteria and insurance authorization.  Follow Up Recommendations Skilled nursing-short term rehab (<3 hours/day) Can patient physically be transported by private vehicle: No    Assistance Recommended at Discharge Frequent or constant Supervision/Assistance  Patient can return home with the following  Assist for transportation;Assistance with cooking/housework;Help with stairs or ramp for entrance;Two people to help with walking and/or transfers;A lot of help with bathing/dressing/bathroom;Direct supervision/assist for financial management;Direct supervision/assist for medications management    Equipment Recommendations    Recommendations for Other Services       Functional Status Assessment Patient has had a recent decline in their functional  status and demonstrates the ability to make significant improvements in function in a reasonable and predictable amount of time.     Precautions / Restrictions Precautions Precautions: Shoulder;Fall Type of Shoulder Precautions: Per Previous encounter: Pt 2 months out humeral fracture. Per Dr. Sharlet Salina, allowed gentle strengthening and shoulder mobility. Sling no longer required, no WB restrictions Precaution Booklet Issued: No Restrictions Weight Bearing Restrictions: No      Mobility  Bed Mobility Overal bed mobility: Needs Assistance Bed Mobility: Supine to Sit     Supine to sit: HOB elevated, Mod assist (Maximum HOB elevation) Sit to supine: Max assist        Transfers                   General transfer comment: Pt unable to safely complete transfer 2/2 reports of back, buttock, and stomach pain.    Ambulation/Gait                  Stairs            Wheelchair Mobility    Modified Rankin (Stroke Patients Only)       Balance Overall balance assessment: Needs assistance Sitting-balance support: Feet supported, Single extremity supported Sitting balance-Leahy Scale: Poor   Postural control: Left lateral lean                                   Pertinent Vitals/Pain Pain Assessment Pain Assessment: 0-10 Pain Score: 8  Pain Location: Back pain Pain Descriptors / Indicators: Dull, Throbbing Pain Intervention(s): Repositioned, Limited activity within patient's tolerance    Home Living Family/patient expects to be discharged to:: Private residence (Prior  to admission pt was at [facility] Living Arrangements: Alone Available Help at Discharge: Friend(s);Personal care attendant Type of Home: House Home Access: Stairs to enter Entrance Stairs-Rails: Right;Left;Can reach both Entrance Stairs-Number of Steps: 5   Home Layout: One level Home Equipment: BSC/3in1;Rollator (4 wheels);Shower seat;Grab bars - tub/shower;Grab bars -  toilet      Prior Function Prior Level of Function : Needs assist       Physical Assist : ADLs (physical)   ADLs (physical): IADLs Mobility Comments: Relies on rollator for mobility ADLs Comments: Pt reports had been Mod I in ADL prior to humeral fx in May 2023. At present has home care aide 7 days/wk, 4 hrs./day to assist with IADL and some ADL     Hand Dominance   Dominant Hand: Right    Extremity/Trunk Assessment   Upper Extremity Assessment Upper Extremity Assessment: RUE deficits/detail RUE Deficits / Details: some anxiety noted with R shoulder PROM. Pt refusing mobility passed ~45 degrees of shoulder flexion. RUE: Shoulder pain with ROM;Shoulder pain at rest RUE Sensation: WNL RUE Coordination: WNL    Lower Extremity Assessment Lower Extremity Assessment: RLE deficits/detail;LLE deficits/detail RLE Deficits / Details: ~30 degrees of active hip ROM noted, PROM is >90 degrees. RLE Sensation: decreased light touch LLE Deficits / Details: limited active hip ROM, passive hip ROM >90       Communication   Communication: No difficulties  Cognition Arousal/Alertness: Lethargic Behavior During Therapy: WFL for tasks assessed/performed Overall Cognitive Status: No family/caregiver present to determine baseline cognitive functioning                                 General Comments: Pt requiring constant arousal to remain awake during session. Often repeating herself throughout the session.        General Comments      Exercises Other Exercises Other Exercises: Pt educated on the need to participate in R Shoulder ROM exercises in order to address stiffness and pain in the RUE. The pt has hx of humeral fx x38months, but has been cleared for WB and ROM. Other Exercises: Pt educated on the need for continued skilled care at d/c 2/2 limited independence of mobility at this time.   Assessment/Plan    PT Assessment Patient needs continued PT services  PT  Problem List Decreased strength;Decreased mobility;Decreased range of motion;Decreased coordination;Decreased activity tolerance;Decreased balance;Impaired sensation;Decreased knowledge of precautions;Decreased skin integrity;Pain       PT Treatment Interventions DME instruction;Balance training;Gait training;Neuromuscular re-education;Stair training;Functional mobility training;Patient/family education;Therapeutic activities;Therapeutic exercise    PT Goals (Current goals can be found in the Care Plan section)  Acute Rehab PT Goals Patient Stated Goal: decrease pain PT Goal Formulation: With patient Time For Goal Achievement: 05/01/22 Potential to Achieve Goals: Fair    Frequency Min 2X/week     Co-evaluation               AM-PAC PT "6 Clicks" Mobility  Outcome Measure Help needed turning from your back to your side while in a flat bed without using bedrails?: A Lot Help needed moving from lying on your back to sitting on the side of a flat bed without using bedrails?: A Lot Help needed moving to and from a bed to a chair (including a wheelchair)?: Total Help needed standing up from a chair using your arms (e.g., wheelchair or bedside chair)?: Total Help needed to walk in hospital room?: Total Help needed climbing  3-5 steps with a railing? : Total 6 Click Score: 8    End of Session Equipment Utilized During Treatment: Oxygen Activity Tolerance: Patient limited by pain Patient left: in bed;with nursing/sitter in room;with call bell/phone within reach Nurse Communication: Mobility status PT Visit Diagnosis: History of falling (Z91.81);Muscle weakness (generalized) (M62.81);Difficulty in walking, not elsewhere classified (R26.2);Pain Pain - Right/Left:  (midline) Pain - part of body:  (back)    Time: 6381-7711 PT Time Calculation (min) (ACUTE ONLY): 44 min   Charges:   PT Evaluation $PT Eval Moderate Complexity: 1 Mod PT Treatments $Therapeutic Activity: 23-37 mins         10:36 AM, 04/17/22 Abaigeal Moomaw A. Saverio Danker PT, DPT Physical Therapist - [facility] A Yitzel Shasteen 04/17/2022, 10:34 AM

## 2022-04-17 NOTE — Plan of Care (Signed)

## 2022-04-18 ENCOUNTER — Other Ambulatory Visit: Payer: Self-pay | Admitting: Student

## 2022-04-18 ENCOUNTER — Encounter: Payer: Self-pay | Admitting: Internal Medicine

## 2022-04-18 DIAGNOSIS — G8929 Other chronic pain: Secondary | ICD-10-CM | POA: Diagnosis not present

## 2022-04-18 DIAGNOSIS — Z515 Encounter for palliative care: Secondary | ICD-10-CM | POA: Diagnosis not present

## 2022-04-18 DIAGNOSIS — K5900 Constipation, unspecified: Secondary | ICD-10-CM | POA: Diagnosis not present

## 2022-04-18 DIAGNOSIS — M272 Inflammatory conditions of jaws: Secondary | ICD-10-CM | POA: Diagnosis not present

## 2022-04-18 DIAGNOSIS — K6389 Other specified diseases of intestine: Secondary | ICD-10-CM

## 2022-04-18 DIAGNOSIS — J9611 Chronic respiratory failure with hypoxia: Secondary | ICD-10-CM | POA: Diagnosis not present

## 2022-04-18 LAB — GLUCOSE, CAPILLARY
Glucose-Capillary: 107 mg/dL — ABNORMAL HIGH (ref 70–99)
Glucose-Capillary: 98 mg/dL (ref 70–99)
Glucose-Capillary: 153 mg/dL — ABNORMAL HIGH (ref 70–99)
Glucose-Capillary: 138 mg/dL — ABNORMAL HIGH (ref 70–99)

## 2022-04-18 LAB — CREATININE, SERUM
Creatinine, Ser: 0.47 mg/dL (ref 0.44–1.00)
GFR, Estimated: >60 mL/min (ref >60)

## 2022-04-18 LAB — BLOOD CULTURE ID PANEL (REFLEXED) - BCID2

## 2022-04-18 LAB — URINALYSIS, ROUTINE W REFLEX MICROSCOPIC
Bilirubin Urine: NEGATIVE
Glucose, UA: >=500 mg/dL — AB
Ketones, ur: 5 mg/dL — AB
Nitrite: NEGATIVE
Protein, ur: 30 mg/dL — AB
RBC / HPF: >50 RBC/hpf — ABNORMAL HIGH (ref 0–5)
Specific Gravity, Urine: 1.027 (ref 1.005–1.030)
WBC, UA: >50 WBC/hpf — ABNORMAL HIGH (ref 0–5)
pH: 5 (ref 5.0–8.0)

## 2022-04-18 LAB — CULTURE, BLOOD (ROUTINE X 2): Special Requests: ADEQUATE

## 2022-04-18 LAB — PROTIME-INR
INR: 1.2 (ref 0.8–1.2)
Prothrombin Time: 15.5 seconds — ABNORMAL HIGH (ref 11.4–15.2)

## 2022-04-18 NOTE — Progress Notes (Signed)
Vitals entered manually

## 2022-04-18 NOTE — Progress Notes (Signed)
PT Cancellation Note  Patient Details Name: April [name] MRN: [id number] DOB: 12-Mar-1946   Cancelled Treatment:    Reason Eval/Treat Not Completed: Other (comment). Pt declining PT at this time, stated she wanted a nap. Unable to convince pt for mobility, PT to re-attempt as able.   Lieutenant Diego PT, DPT 3:21 PM,04/18/22

## 2022-04-18 NOTE — Plan of Care (Signed)
  Problem: Nutrition: Goal: Adequate nutrition will be maintained Outcome: Progressing   Problem: Safety: Goal: Ability to remain free from injury will improve Outcome: Progressing   Problem: Skin Integrity: Goal: Risk for impaired skin integrity will decrease Outcome: Progressing   

## 2022-04-18 NOTE — Evaluation (Signed)
Occupational Therapy Evaluation Patient Details Name: April [name] MRN: [id number] DOB: [birth year] Today's Date: 04/18/2022   History of Present Illness April [name] is an 76 y.o. 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   Clinical Impression   April [name] presents for the second time this month with generalized weakness, limited endurance, impaired balance, and pain. She had been living alone in a single-level home, with a caregiver coming to her house 4 hrs/day, 7 days/wk; but for current hospitalization pt comes from [facility]. During today's evaluation, pt demonstrates a high level of anxiety and is reluctant to engage in any movement, fearful that this will increase her pain. She reports pain throughout her body, including face, abdomen, chest, RUE, w/ most significant pain (8/10) in abdomen. Pt is better able to use her RUE than during her previous visit, but still has little ROM at shoulder. Pt requires Mod-Max A, with ongoing cueing and encouragement, for supine<>sit. Uses LUE only for rolling in bed, unwilling to engage RUE for this task. Discussed goals of care and DC recs. Pt accepting that returning home is not an option at present, but reports that she would like to go to a different SNF than the one she came from. Case management notified. Pt also reports that her strongest interest at this point is managing her pain and comfort. Recommend another palliative/hospice consult.    Recommendations for follow up therapy are one component of a multi-disciplinary discharge planning process, led by the attending physician.  Recommendations may be updated based on patient status, additional functional criteria and insurance authorization.   Follow Up Recommendations  Skilled nursing-short term rehab (<3  hours/day)    Assistance Recommended at Discharge Frequent or constant Supervision/Assistance  Patient can return home with the following A lot of help with walking and/or transfers;A lot of help with bathing/dressing/bathroom;Assistance with cooking/housework;Direct supervision/assist for medications management    Functional Status Assessment  Patient has had a recent decline in their functional status and/or demonstrates limited ability to make significant improvements in function in a reasonable and predictable amount of time  Equipment Recommendations  None recommended by OT    Recommendations for Other Services Other (comment) (palliative care/hospice consult)     Precautions / Restrictions Precautions Precautions: Shoulder;Fall Type of Shoulder Precautions: Per Previous encounter: Pt 2 months out humeral fracture. Per Dr. Sharlet Salina, allowed gentle strengthening and shoulder mobility. Sling no longer required, no WB restrictions Restrictions Weight Bearing Restrictions: No      Mobility Bed Mobility Overal bed mobility: Needs Assistance Bed Mobility: Supine to Sit, Rolling, Sit to Supine Rolling: Mod assist   Supine to sit: HOB elevated, Mod assist Sit to supine: Mod assist   General bed mobility comments: HOB maximally elevated, minimal use of RUE.    Transfers                   General transfer comment: Pt declines transfers, OOB mobility, citing pain      Balance Overall balance assessment: Needs assistance Sitting-balance support: Feet supported, Single extremity supported Sitting balance-Leahy Scale: Poor Sitting balance - Comments: poor tolerance to activity. Mod to Max A to transition and maintain upright position.     Standing balance-Leahy Scale: Zero  ADL either performed or assessed with clinical judgement   ADL                                         General ADL Comments: Anticipate  Max A for OOB fxl mobility     Vision         Perception     Praxis      Pertinent Vitals/Pain Pain Assessment Pain Score: 8  Faces Pain Scale: Hurts whole lot Pain Location: abdomen Pain Intervention(s): Repositioned, Utilized relaxation techniques, Limited activity within patient's tolerance     Hand Dominance Right   Extremity/Trunk Assessment Upper Extremity Assessment Upper Extremity Assessment: RUE deficits/detail RUE Deficits / Details: some anxiety noted with R shoulder PROM. Pt refusing mobility passed ~45 degrees of shoulder flexion. RUE Sensation: WNL RUE Coordination: WNL   Lower Extremity Assessment Lower Extremity Assessment: Generalized weakness       Communication Communication Communication: No difficulties   Cognition Arousal/Alertness: Awake/alert Behavior During Therapy: WFL for tasks assessed/performed, Anxious Overall Cognitive Status: No family/caregiver present to determine baseline cognitive functioning                                 General Comments: Pt demonstrates a high level of anxiety     General Comments       Exercises Other Exercises Other Exercises: Educ re: PoC, DC recs, pain mgmt, relaxation techniques   Shoulder Instructions      Home Living Family/patient expects to be discharged to:: Private residence Living Arrangements: Alone Available Help at Discharge: Friend(s);Personal care attendant Type of Home: House Home Access: Stairs to enter [employer] of Steps: 5 Entrance Stairs-Rails: Right;Left;Can reach both Home Layout: One level     Bathroom Shower/Tub: Occupational psychologist: Handicapped height     Home Equipment: BSC/3in1;Rollator (4 wheels);Shower seat;Grab bars - tub/shower;Grab bars - toilet Adaptive Equipment: Reacher;Sock aid Additional Comments: Pt is coming to [facility] for SNF, where she has been since her hospitalization earlier this month (7/8-7/16)       Prior Functioning/Environment Prior Level of Function : Needs assist             Mobility Comments: Relies on rollator for mobility ADLs Comments: Pt reports had been Mod I in ADL prior to humeral fx in May 2023. When lving at home, had home care aide 7 days/wk, 4 hrs./day to assist with IADL and some ADL        OT Problem List: Impaired UE functional use;Decreased activity tolerance;Decreased range of motion;Decreased strength;Impaired balance (sitting and/or standing);Decreased cognition;Pain      OT Treatment/Interventions: Self-care/ADL training;Therapeutic exercise;Patient/family education;Balance training;Energy conservation;Therapeutic activities;DME and/or AE instruction    OT Goals(Current goals can be found in the care plan section) Acute Rehab OT Goals Patient Stated Goal: to be comfortable OT Goal Formulation: With patient Time For Goal Achievement: 05/02/22 Potential to Achieve Goals: Good ADL Goals Pt Will Perform Grooming: with set-up;with supervision;sitting;bed level Pt Will Perform Upper Body Dressing: with supervision;with set-up;sitting Additional ADL Goal #1: Pt will identify/demonstrate 2+ methods for managing pain Additional ADL Goal #2: Pt will complete bed mobility, able to roll in bed for repositioning and comfort with Mod Ind, and moving supine<>sit with Min A  OT Frequency: Min 2X/week    Co-evaluation  AM-PAC OT "6 Clicks" Daily Activity     Outcome Measure Help from another person eating meals?: A Little Help from another person taking care of personal grooming?: A Lot Help from another person toileting, which includes using toliet, bedpan, or urinal?: A Lot Help from another person bathing (including washing, rinsing, drying)?: A Lot Help from another person to put on and taking off regular upper body clothing?: A Lot Help from another person to put on and taking off regular lower body clothing?: A Lot 6 Click Score: 13   End  of Session    Activity Tolerance: Patient tolerated treatment well Patient left: in bed;with call bell/phone within reach;with bed alarm set  OT Visit Diagnosis: Unsteadiness on feet (R26.81);Muscle weakness (generalized) (M62.81)                Time: 5462-7035 OT Time Calculation (min): 15 min Charges:  OT General Charges $OT Visit: 1 Visit OT Evaluation $OT Eval Moderate Complexity: 1 Mod OT Treatments $Self Care/Home Management : 8-22 mins Josiah Lobo, PhD, MS, OTR/L 04/18/22, 10:46 AM

## 2022-04-18 NOTE — Progress Notes (Signed)
[facility]) Hospital Liaison note:  This patient is currently enrolled in [facility] outpatient-based Palliative Care. Will continue to follow for disposition.  Please call with any outpatient palliative questions or concerns.  Thank you, Lorelee Market, LPN [facility] Liaison [phone]

## 2022-04-18 NOTE — Progress Notes (Signed)
PROGRESS NOTE    LIZETTE PAZOS  LJQ:[id number] DOB: 12/02/1945 DOA: 04/15/2022 PCP: Albina Billet, MD    Brief Narrative:  April [name] is a 76 y.o. female with medical history significant for Type II DM, HTN, PVD, recently hospitalized from 7/8 to 7/16 for frequent falls and discharged to SNF, who at that time had chronic leukocytosis of unknown etiology but found to have Klebsiella UTI for which she was treated and who at the same time had a known lung mass seen on CT 01/30/2022 for which she declined treatment or further investigation, evaluated by palliative care prior to discharge, who now returns to the ED with a complaint of abdominal pain, constipation, and pain all over, including pain on her right jaw and ' something going on with her tooth'.  She states her last bowel movement was 2 to 3 weeks prior and she has pain with urination.  She denies fever or chills or cough or shortness of breath. ED course and data review: In the ED intermittently tachycardic to 101.  O2 sat intermittently 90-94 for which she was placed on nasal cannula 2 to 3 L.  Vitals otherwise unremarkable Labs notable for WBC of 27,000 with lactic acid 1.3, up from an high baseline of 19-21,000 during her hospitalization a couple weeks prior.  Platelets 440 and alk phos 561.  The emergency provider spoke with ENT specialist, Dr. Tami Ribas who advised that infection on mandible is possibly related to underlying malignancy so any surgical intervention would likely worsen outcome and thus recommended antibiotic therapy only.  Recommended oral Augmentin, according to discussion with ED provider  7/22 no overnight issues. Oncology was consulted 7/23 plan for ultrasound-guided liver biopsy [place].  PT recommends SNF  Consultants:  Oncology , ENT  Procedures:  CT a/p There is 5.7 cm mass lesion in sigmoid colon suggesting possible primary malignant neoplasm.  There is no evidence of intestinal obstruction or  pneumoperitoneum. There is no hydronephrosis.  There are multiple nodules of varying sizes in the visualized lower lung fields consistent with pulmonary metastatic disease. Patchy infiltrates in the lower lung fields may suggest atelectasis/pneumonia. Small bilateral pleural effusions.  There are multiple space-occupying lesions of varying sizes in the liver measuring up to 7.5 cm in size suggesting hepatic metastatic disease.  There are few nodular densities in both adrenals suggesting possible metastatic disease.  There is inhomogeneous attenuation in the bony structures suggesting possible skeletal metastatic disease. There is a lytic lesion in the posterior medial aspect of left iliac crest consistent with skeletal metastatic disease.  Other findings as described in the body of the report.  CT max Expanded right mandibular body with surrounding soft tissue and multifocal cortical destruction, likely indicating active osteomyelitis with an intra osseous necrotic or purulence collection. Clinical follow-up after resolution of symptoms is recommended to exclude a superimposed neoplastic process.     Antimicrobials:  Unasyn   Subjective: Has no complaints of pain of the rt jaw. No new complaints  Objective: Vitals:   04/17/22 1525 04/17/22 2047 04/18/22 0448 04/18/22 0741  BP: 119/60 136/62 129/67 (!) 125/56  Pulse: 93 (!) 105 91 97  Resp: '16 20 16   ' Temp: 97.7 F (36.5 C) 97.8 F (36.6 C) 97.8 F (36.6 C) 98.4 F (36.9 C)  TempSrc:  Oral    SpO2: 96% 97% 95% 98%  Weight:      Height:        Intake/Output Summary (Last 24 hours) at 04/18/2022 [phone]  Last data filed at 04/18/2022 0451 Gross per 24 hour  Intake 250 ml  Output 850 ml  Net -600 ml   Filed Weights   04/15/22 1441  Weight: 91.4 kg    Examination: Calm, NAD Cta no w/r Reg s1/s2 no gallop Soft benign +bs No edema Aaoxox3  Mood and affect appropriate in current setting     Data Reviewed: I  have personally reviewed following labs and imaging studies  CBC: Recent Labs  Lab 04/15/22 1442 04/16/22 0402 04/17/22 1018  WBC 27.6* 28.4* 25.4*  HGB 14.4 14.0 14.1  HCT 46.2* 45.0 45.8  MCV 85.4 87.2 88.8  PLT 440* 457* 315*   Basic Metabolic Panel: Recent Labs  Lab 04/15/22 1442 04/16/22 0402 04/17/22 0451 04/18/22 0430  NA 132* 136  --   --   K 3.5 4.3  --   --   CL 96* 97*  --   --   CO2 27 27  --   --   GLUCOSE 98 102*  --   --   BUN 14 13  --   --   CREATININE 0.41* 0.50 0.49 0.47  CALCIUM 9.0 9.2  --   --    GFR: Estimated Creatinine Clearance: 69.2 mL/min (by C-G formula based on SCr of 0.47 mg/dL). Liver Function Tests: Recent Labs  Lab 04/15/22 1442  AST 48*  ALT 15  ALKPHOS 561*  BILITOT 1.2  PROT 6.9  ALBUMIN 2.4*   Recent Labs  Lab 04/15/22 1442  LIPASE 24   No results for input(s): "AMMONIA" in the last 168 hours. Coagulation Profile: Recent Labs  Lab 04/18/22 0430  INR 1.2   Cardiac Enzymes: No results for input(s): "CKTOTAL", "CKMB", "CKMBINDEX", "TROPONINI" in the last 168 hours. BNP (last 3 results) No results for input(s): "PROBNP" in the last 8760 hours. HbA1C: No results for input(s): "HGBA1C" in the last 72 hours. CBG: Recent Labs  Lab 04/17/22 0724 04/17/22 1149 04/17/22 1528 04/17/22 2244 04/18/22 0747  GLUCAP 120* 168* 141* 115* 107*   Lipid Profile: No results for input(s): "CHOL", "HDL", "LDLCALC", "TRIG", "CHOLHDL", "LDLDIRECT" in the last 72 hours. Thyroid Function Tests: No results for input(s): "TSH", "T4TOTAL", "FREET4", "T3FREE", "THYROIDAB" in the last 72 hours. Anemia Panel: No results for input(s): "VITAMINB12", "FOLATE", "FERRITIN", "TIBC", "IRON", "RETICCTPCT" in the last 72 hours. Sepsis Labs: Recent Labs  Lab 04/15/22 1911 04/17/22 0451  LATICACIDVEN 1.3 1.7    Recent Results (from the past 240 hour(s))  Blood culture (routine x 2)     Status: Abnormal (Preliminary result)   Collection  Time: 04/15/22  7:11 PM   Specimen: BLOOD LEFT WRIST  Result Value Ref Range Status   Specimen Description   Final    BLOOD LEFT WRIST Performed at [facility], [address] [place], [place] [address]    Special Requests   Final    BOTTLES DRAWN AEROBIC AND ANAEROBIC Blood Culture adequate volume Performed at [facility], [address], [place], [place] [address]    Culture  Setup Time   Final    GRAM POSITIVE COCCI ANAEROBIC BOTTLE ONLY Organism ID to follow CRITICAL RESULT CALLED TO, READ BACK BY AND VERIFIED WITH: RODNEY GRUBB 04/16/22 1451 DE    Culture (A)  Final    STAPHYLOCOCCUS EPIDERMIDIS THE SIGNIFICANCE OF ISOLATING THIS ORGANISM FROM A SINGLE SET OF BLOOD CULTURES WHEN MULTIPLE SETS ARE DRAWN IS UNCERTAIN. PLEASE NOTIFY THE MICROBIOLOGY DEPARTMENT WITHIN ONE WEEK IF SPECIATION AND SENSITIVITIES  ARE REQUIRED. Performed at [facility], [place] [address] [place], [place] [address]    Report Status PENDING  Incomplete  Blood Culture ID Panel (Reflexed)     Status: Abnormal   Collection Time: 04/15/22  7:11 PM  Result Value Ref Range Status   Enterococcus faecalis NOT DETECTED NOT DETECTED Final   Enterococcus Faecium NOT DETECTED NOT DETECTED Final   Listeria monocytogenes NOT DETECTED NOT DETECTED Final   Staphylococcus species DETECTED (A) NOT DETECTED Final    Comment: CRITICAL RESULT CALLED TO, READ BACK BY AND VERIFIED WITH: RODNEY GRUBB 04/16/22 1451 DE    Staphylococcus aureus (BCID) NOT DETECTED NOT DETECTED Final   Staphylococcus epidermidis DETECTED (A) NOT DETECTED Final    Comment: Methicillin (oxacillin) resistant coagulase negative staphylococcus. Possible blood culture contaminant (unless isolated from more than one blood culture draw or clinical case suggests pathogenicity). No antibiotic treatment is indicated for blood  culture contaminants. CRITICAL RESULT CALLED TO, READ BACK BY AND VERIFIED WITH: RODNEY GRUBB 04/16/22  1451 DE    Staphylococcus lugdunensis NOT DETECTED NOT DETECTED Final   Streptococcus species NOT DETECTED NOT DETECTED Final   Streptococcus agalactiae NOT DETECTED NOT DETECTED Final   Streptococcus pneumoniae NOT DETECTED NOT DETECTED Final   Streptococcus pyogenes NOT DETECTED NOT DETECTED Final   A.calcoaceticus-baumannii NOT DETECTED NOT DETECTED Final   Bacteroides fragilis NOT DETECTED NOT DETECTED Final   Enterobacterales NOT DETECTED NOT DETECTED Final   Enterobacter cloacae complex NOT DETECTED NOT DETECTED Final   Escherichia coli NOT DETECTED NOT DETECTED Final   Klebsiella aerogenes NOT DETECTED NOT DETECTED Final   Klebsiella oxytoca NOT DETECTED NOT DETECTED Final   Klebsiella pneumoniae NOT DETECTED NOT DETECTED Final   Proteus species NOT DETECTED NOT DETECTED Final   Salmonella species NOT DETECTED NOT DETECTED Final   Serratia marcescens NOT DETECTED NOT DETECTED Final   Haemophilus influenzae NOT DETECTED NOT DETECTED Final   Neisseria meningitidis NOT DETECTED NOT DETECTED Final   Pseudomonas aeruginosa NOT DETECTED NOT DETECTED Final   Stenotrophomonas maltophilia NOT DETECTED NOT DETECTED Final   Candida albicans NOT DETECTED NOT DETECTED Final   Candida auris NOT DETECTED NOT DETECTED Final   Candida glabrata NOT DETECTED NOT DETECTED Final   Candida krusei NOT DETECTED NOT DETECTED Final   Candida parapsilosis NOT DETECTED NOT DETECTED Final   Candida tropicalis NOT DETECTED NOT DETECTED Final   Cryptococcus neoformans/gattii NOT DETECTED NOT DETECTED Final   Methicillin resistance mecA/C DETECTED (A) NOT DETECTED Final    Comment: Performed at [facility], [address] [place], [place] [address]  Culture, blood (Routine X 2) w Reflex to ID Panel     Status: None (Preliminary result)   Collection Time: 04/17/22  4:51 AM   Specimen: BLOOD  Result Value Ref Range Status   Specimen Description BLOOD LEFT ANTECUBITAL  Final   Special Requests    Final    BOTTLES DRAWN AEROBIC AND ANAEROBIC Blood Culture results may not be optimal due to an inadequate volume of blood received in culture bottles   Culture   Final    NO GROWTH 1 DAY Performed at [facility], [address], [place], [place] [address]    Report Status PENDING  Incomplete         Radiology Studies: No results found.      Scheduled Meds:  amLODipine  5 mg Oral Daily   bisacodyl  10 mg Rectal Once   chlorhexidine  10  mL Mouth/Throat QID   docusate sodium  100 mg Oral BID   empagliflozin  10 mg Oral Daily   enoxaparin (LOVENOX) injection  0.5 mg/kg Subcutaneous Q24H   gabapentin  300 mg Oral TID   insulin aspart  0-15 Units Subcutaneous TID WC   insulin aspart  0-5 Units Subcutaneous QHS   metFORMIN  500 mg Oral BID   metoprolol tartrate  25 mg Oral BID   polyethylene glycol  17 g Oral BID   pravastatin  40 mg Oral Daily   Continuous Infusions:  ampicillin-sulbactam (UNASYN) IV 3 g (04/18/22 0318)   vancomycin Stopped (04/17/22 0855)    Assessment & Plan:   Principal Problem:   Constipation Active Problems:   Colon cancer metastasized to liver, lung and bone (HCC)   Acute osteomyelitis of mandible   Type 2 diabetes mellitus (HCC)   PAD (peripheral artery disease) (HCC)   Essential hypertension   Chronic pain   Frailty syndrome in geriatric patient   Chronic respiratory failure with hypoxia ([place])   Osteomyelitis of mandible   Goals of care, counseling/discussion   Colonic mass   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 continue bowel regimen .  Bisocodyl suppository x1 7/24 oncology consulted input appreciated.  Recommend ultrasound-guided liver biopsy for tissue diagnosis and then go from there.  CEA for tumor marker testing also will be ordered by oncology   Acute osteomyelitis of mandible Possible metastatic lesion right mandible Patient has right-sided jaw pain and swelling with leukocytosis of 27,000 and abnormal CT findings CT shows: Expanded right mandibular body with surrounding soft tissue and multifocal cortical destruction, likely indicating active osteomyelitis with an intra osseous necrotic or purulence collection Continue Unasyn and transition to Augmentin for discharge ENT consult to follow, though based on conversation with ED provider no surgical intervention recommended due to likely malignant and infectious component Pain control Continue unasyn. Spoke to ID Dr. Linus Salmons on call, recommended on discharge 4 weeks of augmentin with f/u with ENT or ID. 7/23 ENT input appreciated. Neoplasm uncertain behavior mandible with 2/2 infection. No acute abscess per ENT. No drainage , likely some dead necrotic tissue with 2/2ary infection.  Peridex mouth rinse  ?ENT bx possibly if needed by oncology 7/24 continue IV antibiotic     Chronic respiratory failure with Hypoxia Was discharged previously on 2L on 7/16.  Likely from lung mets and possibly underlying OSA 7/24 continue O2 supplementation keeping sats above 92%        Frailty syndrome in geriatric patient Patient with a history of recent frequent falls resulting in injury, untreated metastatic cancer, recently hospitalized and discharged to SNF [facility] and nutritionist consult 7/24 PT recommended SNF    Essential HTN Bp has room for improvement Add amlodipine 70m qd Continue beta blk   Chronic pain Continue  gabapentin and acetaminophen   PAD (peripheral artery disease) ([place] Continue pravastatin   Type 2 diabetes mellitus ([place] Continue Jardiance and metformin and sliding scale insulin coverage  DVT prophylaxis: lovenox Code Status:full Family Communication: none Disposition Plan:  Status is: inpatient The patient remains inpatient due to need of pain mx, w/u pending , and iv treatment        LOS: 2 days   Time spent: 35 min    Nolberto Hanlon, MD [facility] Pager [phone]  If 7PM-7AM, please contact night-coverage 04/18/2022, 8:17 AM

## 2022-04-18 NOTE — Consult Note (Signed)
Chief Complaint: Patient was seen in consultation today for liver lesions   Referring Physician(s): Rita Ohara, MD  Supervising Physician: Juliet Rude  Patient Status: [place] - In-pt  History of Present Illness: April [name] is a 76 y.o. female with PMH of Type 2 diabetes mellitus, hypertension, and NSTEMI being seen today for pre-procedural evaluation for an ultrasound-guided liver biopsy. Patient was admitted to [facility] after presenting to the ED on 7/21 with concerns of abdominal pain and consultation. Patient underwent CT Abdomen & Pelvis w Contrast revealed mass in the sigmoid colon suggestive of a possible primary malignancy, multiple pulmonary nodules, and multiple lesions of the liver with the most significant being a 7.5cm lesion suggestive of metastatic disease. IR was consulted to evaluate the patient for ultrasound guided biopsy of the liver.  Past Medical History:  Diagnosis Date   Acute renal failure (ARF) ([place])    Difficult intubation    Hypertension    NSTEMI (non-ST elevated myocardial infarction) ([place])    Paroxysmal atrial fibrillation (HCC)    Renal disorder    Sepsis [facility]) January 2017   [facility]   Type 2 diabetes mellitus [facility])     Past Surgical History:  Procedure Laterality Date   broken hip  2019   CYSTOSCOPY WITH STENT PLACEMENT Right 11/03/2015   Procedure: CYSTOSCOPY WITH STENT PLACEMENT;  Surgeon: Hollice Espy, MD;  Location: [facility] ORS;  Service: Urology;  Laterality: Right;   FRACTURE SURGERY Right    Elbow   INTRAMEDULLARY (IM) NAIL INTERTROCHANTERIC Right 02/07/2019   Procedure: INTRAMEDULLARY (IM) NAIL INTERTROCHANTRIC;  Surgeon: Thornton Park, MD;  Location: [facility] ORS;  Service: Orthopedics;  Laterality: Right;   TONSILLECTOMY     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   Procedure: TRANSMETATARSAL AMPUTATION;  Surgeon: Algernon Huxley, MD;  Location: [facility] ORS;  Service: Vascular;  Laterality: Bilateral;    Allergies: Prednisone and  Other  Medications: Prior to Admission medications   Medication Sig Start Date End Date Taking? Authorizing Provider  diclofenac Sodium (VOLTAREN) 1 % GEL Apply 4 g topically 4 (four) times daily. 02/04/22  Yes [provider]  empagliflozin (JARDIANCE) 10 MG TABS tablet Take by mouth daily.   Yes [provider]  gabapentin (NEURONTIN) 300 MG capsule Take 1 capsule (300 mg total) by mouth 3 (three) times daily. 04/10/22  Yes Sreenath, Sudheer B, MD  metFORMIN (GLUCOPHAGE) 500 MG tablet Take 500 mg by mouth 2 (two) times daily.  09/13/17  Yes [provider]  metoprolol tartrate (LOPRESSOR) 25 MG tablet Take 1 tablet (25 mg total) by mouth 2 (two) times daily. 11/09/15  Yes Henreitta Leber, MD  acetaminophen (TYLENOL) 500 MG tablet Take 1,000 mg by mouth every 8 (eight) hours as needed for moderate pain. Reported on 12/16/2015    [provider]  docusate sodium (COLACE) 100 MG capsule Take 1 capsule (100 mg total) by mouth 2 (two) times daily. 12/10/15   Dustin Flock, MD  polyethylene glycol (MIRALAX / Floria Raveling) packet Take 17 g by mouth daily. Patient taking differently: Take 17 g by mouth daily as needed for mild constipation. 12/10/15   Dustin Flock, MD  pravastatin (PRAVACHOL) 40 MG tablet Take 40 mg by mouth daily. 11/23/21   [provider]     Family History  Problem Relation Age of Onset   Diabetes Father    Heart disease Father    Stroke Maternal Grandmother     Social History   Socioeconomic History   Marital  status: Single    Spouse name: Not on file   Number of children: Not on file   Years of education: Not on file   Highest education level: Not on file  Occupational History   Not on file  Tobacco Use   Smoking status: Never   Smokeless tobacco: Never  Substance and Sexual Activity   Alcohol use: No    Alcohol/week: 0.0 standard drinks of alcohol   Drug use: No   Sexual activity: Not on file  Other Topics Concern    Not on file  Social History Narrative   Not on file   Social Determinants of Health   Financial Resource Strain: Not on file  Food Insecurity: Not on file  Transportation Needs: Not on file  Physical Activity: Not on file  Stress: Not on file  Social Connections: Not on file      Review of Systems: A 12 point ROS discussed and pertinent positives are indicated in the HPI above.  All other systems are negative.  Review of Systems  Constitutional:  Negative for chills and fever.  Respiratory:  Negative for chest tightness and shortness of breath.   Cardiovascular:  Negative for chest pain and leg swelling.  Gastrointestinal:  Negative for diarrhea, nausea and vomiting.  Neurological:  Negative for dizziness and headaches.  Psychiatric/Behavioral:  Negative for confusion.     Vital Signs: BP (!) 125/56 (BP Location: Left Arm)   Pulse 97   Temp 98.4 F (36.9 C)   Resp 16   Ht 5\' 6"  (1.676 m)   Wt 201 lb 8 oz (91.4 kg)   SpO2 98%   BMI 32.52 kg/m     Physical Exam Vitals reviewed.  Constitutional:      General: She is not in acute distress.    Appearance: She is ill-appearing.  HENT:     Head: Normocephalic.     Mouth/Throat:     Mouth: Mucous membranes are moist.  Cardiovascular:     Rate and Rhythm: Normal rate and regular rhythm.     Pulses: Normal pulses.     Heart sounds: Normal heart sounds.  Pulmonary:     Effort: Pulmonary effort is normal.     Breath sounds: Normal breath sounds.  Abdominal:     General: Bowel sounds are normal.     Palpations: Abdomen is soft.  Neurological:     Mental Status: She is alert and oriented to person, place, and time.  Psychiatric:        Mood and Affect: Mood normal.        Behavior: Behavior normal.     Imaging: CT ABDOMEN PELVIS W CONTRAST  Result Date: 04/15/2022 CLINICAL DATA:  Abdominal pain and distention EXAM: CT ABDOMEN AND PELVIS WITH CONTRAST TECHNIQUE: Multidetector CT imaging of the abdomen and  pelvis was performed using the standard protocol following bolus administration of intravenous contrast. RADIATION DOSE REDUCTION: This exam was performed according to the departmental dose-optimization program which includes automated exposure control, adjustment of the mA and/or kV according to patient size and/or use of iterative reconstruction technique. CONTRAST:  135mL OMNIPAQUE IOHEXOL 300 MG/ML  SOLN COMPARISON:  Previous studies including the CT abdomen and pelvis done on 01/05/2016 and CT chest done on 01/29/2022 FINDINGS: Lower chest: There are multiple nodules of varying sizes in the lower lung fields. There is a 3.2 cm nodule/mass in the left lower lobe. There are small patchy infiltrates in both lower lobes. Minimal bilateral pleural effusions are  seen. Dense calcification is seen in mitral annulus. Hepatobiliary: There is interval appearance of multiple low-density space-occupying lesions in liver largest measuring 7.5 x 4.1 cm in the anterior aspect of the liver. There is no dilation of bile ducts. There is increased density in the dependent portion of gallbladder suggesting gallbladder stones. Pancreas: There is atrophy.  No focal abnormalities are seen. Spleen: Unremarkable. Adrenals/Urinary Tract: There is 11 mm nodule in left adrenal. There are small nodular densities in right adrenal measuring up to 11 mm. There is no hydronephrosis. Lobulations are seen in the margins of the kidneys, possibly suggesting scarring. There is 17 mm calculus in the upper pole of right kidney. There is 12 mm calculus in the midportion. There is 12 mm calculus in the lower pole. Ureters are not dilated. Urinary bladder is distended. There is air in the bladder, possibly suggesting recent instrumentation. Diverticulum is noted in the left posterior aspect of the urinary bladder. Stomach/Bowel: Small hiatal hernia is seen. Stomach is not distended. Small bowel loops not dilated. Appendix is not dilated. Scattered  diverticula seen in colon without signs of focal acute diverticulitis. There is wall thickening in 5.7 cm segment of sigmoid colon in midline. Oral contrast has reached the rectum. There is large amount of stool in rectum. Transverse diameter of rectum measures 9.4 cm. There is no significant wall thickening in rectum. Vascular/Lymphatic: Scattered arterial calcifications are seen. Reproductive: There is low-density in the central portion of the uterus. There is 2.2 cm fluid density structure in the right adnexa. Other: There is no ascites or pneumoperitoneum. Umbilical hernia containing fat is seen. Musculoskeletal: There is previous internal fixation in right femur with intramedullary rod. Degenerative changes are noted in right hip. Degenerative changes are noted in lower thoracic spine and lumbar spine. There is fracture in the upper endplate of body of L3 vertebra which may be recent or old. Decrease in height of multiple thoracic vertebral bodies may suggest recent or old compression fractures. There is inhomogeneous in attenuation in the bony structures this may be due to osteoporosis or infiltrative neoplastic process. There is a lytic lesion with adjacent soft tissue mass in the posterior aspect of left iliac crest. IMPRESSION: There is 5.7 cm mass lesion in sigmoid colon suggesting possible primary malignant neoplasm. There is no evidence of intestinal obstruction or pneumoperitoneum. There is no hydronephrosis. There are multiple nodules of varying sizes in the visualized lower lung fields consistent with pulmonary metastatic disease. Patchy infiltrates in the lower lung fields may suggest atelectasis/pneumonia. Small bilateral pleural effusions. There are multiple space-occupying lesions of varying sizes in the liver measuring up to 7.5 cm in size suggesting hepatic metastatic disease. There are few nodular densities in both adrenals suggesting possible metastatic disease. There is inhomogeneous  attenuation in the bony structures suggesting possible skeletal metastatic disease. There is a lytic lesion in the posterior medial aspect of left iliac crest consistent with skeletal metastatic disease. Other findings as described in the body of the report. Electronically Signed   By: Elmer Picker M.D.   On: 04/15/2022 20:16   CT Maxillofacial W Contrast  Result Date: 04/15/2022 CLINICAL DATA:  Facial abscess EXAM: CT MAXILLOFACIAL WITH CONTRAST TECHNIQUE: Multidetector CT imaging of the maxillofacial structures was performed with intravenous contrast. Multiplanar CT image reconstructions were also generated. RADIATION DOSE REDUCTION: This exam was performed according to the departmental dose-optimization program which includes automated exposure control, adjustment of the mA and/or kV according to patient size and/or use of iterative  reconstruction technique. CONTRAST:  139mL OMNIPAQUE IOHEXOL 300 MG/ML  SOLN COMPARISON:  None Available. FINDINGS: Osseous: The right mandibular body is expanded and hyperlucent with the abnormal area encompassing the roots of the right mandibular molars. There is multifocal cortical destruction. There is a surrounding soft tissue component Orbits: Negative. No traumatic or inflammatory finding. Sinuses: Clear. Soft tissues: Soft tissue swelling surrounding the right mandibular body. Limited intracranial: No significant or unexpected finding. 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 Clinical follow-up after resolution of symptoms is recommended to exclude a superimposed neoplastic process. Electronically Signed   By: Ulyses Jarred M.D.   On: 04/15/2022 20:04   DG Abdomen 1 View  Result Date: 04/15/2022 CLINICAL DATA:  Constipation.  Abdominal distension. EXAM: ABDOMEN - 1 VIEW COMPARISON:  04/09/22 FINDINGS:  Gallstones are identified. There is been interval decrease in previously noted air-filled loops of small bowel within the left hemiabdomen. Progressive accumulation of stool is identified throughout the colon and rectum. Large volume of desiccated stool within the rectum has increased in volume compared with the previous study. Cannot rule out rectal impaction. No signs of pneumoperitoneum. Lumbar scoliosis and degenerative disc disease. Status post ORIF of the right femur. Marked degenerative disc disease is noted involving the right hip. Moderate degenerative disc disease noted in the left hip. IMPRESSION: 1. Progressive accumulation of stool throughout the colon and rectum compatible with constipation. Cannot rule out rectal impaction. 2. Interval decrease in previously noted air-filled loops of small bowel within the left hemiabdomen. 3. Gallstones. Electronically Signed   By: Kerby Moors M.D.   On: 04/15/2022 16:13   DG Abd 1 View  Result Date: 04/09/2022 CLINICAL DATA:  Constipation. EXAM: ABDOMEN - 1 VIEW COMPARISON:  Pelvis radiograph 04/02/2022 FINDINGS: Prominent stool noted at the rectum. Mild gaseous distension above this point. No definite free air. Calcifications in the right abdomen likely gallstones. IMPRESSION: 1. Prominent stool at the rectum with mild gaseous distension above this point. 2. Cholelithiasis. Electronically Signed   By: San Morelle M.D.   On: 04/09/2022 12:27   DG Chest Portable 1 View  Result Date: 04/02/2022 CLINICAL DATA:  Recent fall with right-sided pain, initial encounter EXAM: PORTABLE CHEST 1 VIEW COMPARISON:  01/28/2022 FINDINGS: Cardiac shadow is within normal limits. Aortic calcifications are again seen. Increased vascular congestion and mild edema is noted. No focal confluent infiltrate is seen. Old rib fractures are noted bilaterally with healing. IMPRESSION: No acute abnormality noted. Electronically Signed   By: Inez Catalina M.D.   On: 04/02/2022  21:50   DG Knee 2 Views Right  Result Date: 04/02/2022 CLINICAL DATA:  Right leg pain following fall, initial encounter EXAM: RIGHT KNEE - 2 VIEW COMPARISON:  None Available. FINDINGS: Postsurgical changes in the distal femur are noted. No acute fracture is noted. IMPRESSION: No acute abnormality noted. Electronically Signed   By: Inez Catalina M.D.   On: 04/02/2022 21:49   DG Hip Unilat W or Wo Pelvis 2-3 Views Right  Result Date: 04/02/2022 CLINICAL DATA:  Right-sided hip pain following fall several hours ago, initial encounter EXAM: DG HIP (WITH OR WITHOUT PELVIS) 2-3V RIGHT COMPARISON:  02/07/2019 FINDINGS: Pelvic ring is intact. Postsurgical changes in the proximal right femur are noted. Degenerative changes of the right hip joint are noted. No acute fracture or dislocation is seen. No soft tissue changes are noted. IMPRESSION: Degenerative change in  the right hip joint. No acute abnormality is noted. Electronically Signed   By: Inez Catalina M.D.   On: 04/02/2022 21:48    Labs:  CBC: Recent Labs    04/06/22 0819 04/15/22 1442 04/16/22 0402 04/17/22 1018  WBC 19.5* 27.6* 28.4* 25.4*  HGB 14.3 14.4 14.0 14.1  HCT 44.7 46.2* 45.0 45.8  PLT 287 440* 457* 426*    COAGS: Recent Labs    04/18/22 0430  INR 1.2    BMP: Recent Labs    04/06/22 0819 04/08/22 0540 04/10/22 0547 04/15/22 1442 04/16/22 0402 04/17/22 0451 04/18/22 0430  NA 136 139  --  132* 136  --   --   K 3.1* 5.0  --  3.5 4.3  --   --   CL 98 100  --  96* 97*  --   --   CO2 29 32  --  27 27  --   --   GLUCOSE 140* 118*  --  98 102*  --   --   BUN 16 13  --  14 13  --   --   CALCIUM 9.3 9.7  --  9.0 9.2  --   --   CREATININE 0.49 0.42*   < > 0.41* 0.50 0.49 0.47  GFRNONAA >60 >60   < > >60 >60 >60 >60   < > = values in this interval not displayed.    LIVER FUNCTION TESTS: Recent Labs    04/02/22 2007 04/08/22 0540 04/15/22 1442  BILITOT 1.0 0.5 1.2  AST 56* 44* 48*  ALT 14 20 15   ALKPHOS 465* 574*  561*  PROT 7.1 6.6 6.9  ALBUMIN 2.6* 2.2* 2.4*    TUMOR MARKERS: No results for input(s): "AFPTM", "CEA", "CA199", "CHROMGRNA" in the last 8760 hours.  Assessment and Plan:   Sirenia Whitis is a 76 yo female with PMH of Type 2 diabetes mellitus, hypertension, and NSTEMI being seen today for pre-procedural evaluation for an ultrasound-guided liver biopsy. Patient's Lovenox dose for 7/24 has been held. Imaging has been approved by Dr Denna Haggard, and patient has been approved for ultrasound-guided liver biopsy tentatively for 04/19/22.  Risks and benefits of ultrasound-guided liver biopsy was discussed with the patient including, but not limited to bleeding, infection, damage to adjacent structures or low yield requiring additional tests.  All of the questions were answered and there is agreement to proceed.  Consent signed and in chart.  Thank you for this interesting consult.  I greatly enjoyed meeting April [name] and look forward to participating in their care.  A copy of this report was sent to the requesting provider on this date.  Electronically Signed: Lura Em, PA-C 04/18/2022, 10:00 AM   I spent a total of 20 Minutes    in face to face in clinical consultation, greater than 50% of which was counseling/coordinating care for liver lesions. Marland Kitchen

## 2022-04-18 NOTE — Plan of Care (Signed)

## 2022-04-18 NOTE — Consult Note (Signed)
[place] at [facility] Telephone:([phone] [phone]   Name: April [name] Date: 04/18/2022 MRN: [id number]  DOB: 12/15/1945  Patient Care Team: Albina Billet, MD as PCP - General (Internal Medicine)    REASON FOR CONSULTATION: April [name] is a 76 y.o. female with multiple medical problems including DM, hypertension, PVD, history of lung mass but declined further work-up.  Patient was recently hospitalized 7/8 to 04/10/2022 for falls and was treated for UTI.Marland Kitchen  She was readmitted 04/15/2022 with possible acute osteomyelitis of the mandible.  Work-up including CTs appear consistent with widespread neoplastic process involving the colon, liver, and bone.  Palliative care was consulted to address goals.  SOCIAL HISTORY:     reports that she has never smoked. She has never used smokeless tobacco. She reports that she does not drink alcohol and does not use drugs.  Patient is unmarried.  She has no children.  She lives at home alone.  Her next of kin is a first cousin with whom she speaks regularly.  Patient had a variety of jobs including working as an Scientist, water quality and a Writer at [employer]  ADVANCE DIRECTIVES:  Does not have  CODE STATUS: Full code  PAST MEDICAL HISTORY: Past Medical History:  Diagnosis Date   Acute renal failure (ARF) ([place])    Difficult intubation    Hypertension    NSTEMI (non-ST elevated myocardial infarction) ([place])    Paroxysmal atrial fibrillation (HCC)    Renal disorder    Sepsis [facility]) January 2017   [facility]   Type 2 diabetes mellitus ([place])     PAST SURGICAL HISTORY:  Past Surgical History:  Procedure Laterality Date   broken hip  2019   CYSTOSCOPY WITH STENT PLACEMENT Right 11/03/2015   Procedure: CYSTOSCOPY WITH STENT PLACEMENT;  Surgeon: Hollice Espy, MD;  Location: [facility] ORS;  Service: Urology;  Laterality: Right;   FRACTURE SURGERY Right    Elbow   INTRAMEDULLARY (IM)  NAIL INTERTROCHANTERIC Right 02/07/2019   Procedure: INTRAMEDULLARY (IM) NAIL INTERTROCHANTRIC;  Surgeon: Thornton Park, MD;  Location: [facility] ORS;  Service: Orthopedics;  Laterality: Right;   TONSILLECTOMY     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    ALLERGIES:  is allergic to prednisone and other.  MEDICATIONS:  Current Facility-Administered Medications  Medication Dose Route Frequency Provider Last Rate Last Admin   acetaminophen (TYLENOL) tablet 650 mg  650 mg Oral Q6H PRN Athena Masse, MD   650 mg at 04/17/22 2218   Or   acetaminophen (TYLENOL) suppository 650 mg  650 mg Rectal Q6H PRN Athena Masse, MD       amLODipine (NORVASC) tablet 5 mg  5 mg Oral Daily Nolberto Hanlon, MD   5 mg at 04/17/22 0918   Ampicillin-Sulbactam (UNASYN) 3 g in sodium chloride 0.9 % 100 mL IVPB  3 g Intravenous Q6H Chinita Greenland A, RPH 200 mL/hr at 04/18/22 1232 3 g at 04/18/22 1232   bisacodyl (DULCOLAX) suppository 10 mg  10 mg Rectal Once Nolberto Hanlon, MD       chlorhexidine (PERIDEX) 0.12 % solution 10 mL  10 mL Mouth/Throat QID Beverly Gust, MD   10 mL at 04/18/22 1115   docusate sodium (COLACE) capsule 100 mg  100  mg Oral BID Athena Masse, MD   100 mg at 04/17/22 2218   empagliflozin (JARDIANCE) tablet 10 mg  10 mg Oral Daily Athena Masse, MD   10 mg at 04/17/22 0918   enoxaparin (LOVENOX) injection 45 mg  0.5 mg/kg Subcutaneous Q24H Athena Masse, MD   45 mg at 04/17/22 2245   gabapentin (NEURONTIN) capsule 300 mg  300 mg Oral TID Athena Masse, MD   300 mg at 04/17/22 2218   insulin aspart (novoLOG) injection 0-15 Units  0-15 Units Subcutaneous TID WC Athena Masse, MD   2 Units at 04/17/22 1607   insulin aspart (novoLOG) injection 0-5 Units  0-5 Units Subcutaneous  QHS Athena Masse, MD       metFORMIN (GLUCOPHAGE) tablet 500 mg  500 mg Oral BID Judd Gaudier V, MD   500 mg at 04/17/22 2050   metoprolol tartrate (LOPRESSOR) tablet 25 mg  25 mg Oral BID Athena Masse, MD   25 mg at 04/18/22 7322   morphine (PF) 2 MG/ML injection 2 mg  2 mg Intravenous Q2H PRN Athena Masse, MD   2 mg at 04/18/22 0742   ondansetron (ZOFRAN) tablet 4 mg  4 mg Oral Q6H PRN Athena Masse, MD       Or   ondansetron [facility]) injection 4 mg  4 mg Intravenous Q6H PRN Athena Masse, MD   4 mg at 04/16/22 0057   oxyCODONE (Oxy IR/ROXICODONE) immediate release tablet 5 mg  5 mg Oral Q6H PRN Nolberto Hanlon, MD   5 mg at 04/18/22 1118   polyethylene glycol (MIRALAX / GLYCOLAX) packet 17 g  17 g Oral BID Nolberto Hanlon, MD   17 g at 04/17/22 0919   pravastatin (PRAVACHOL) tablet 40 mg  40 mg Oral Daily Athena Masse, MD   40 mg at 04/17/22 0254   sodium phosphate (FLEET) 7-19 GM/118ML enema 1 enema  1 enema Rectal Daily PRN Nolberto Hanlon, MD       vancomycin (VANCOREADY) IVPB 1250 mg/250 mL  1,250 mg Intravenous Q24H Dallie Piles, RPH 166.7 mL/hr at 04/18/22 1038 1,250 mg at 04/18/22 1038    VITAL SIGNS: BP 130/62 (BP Location: Left Arm)   Pulse (!) 101   Temp 99.1 F (37.3 C)   Resp 16   Ht 5\' 6"  (1.676 m)   Wt 201 lb 8 oz (91.4 kg)   SpO2 94%   BMI 32.52 kg/m  Filed Weights   04/15/22 1441  Weight: 201 lb 8 oz (91.4 kg)    Estimated body mass index is 32.52 kg/m as calculated from the following:   Height as of this encounter: 5\' 6"  (1.676 m).   Weight as of this encounter: 201 lb 8 oz (91.4 kg).  LABS: CBC:    Component Value Date/Time   WBC 25.4 (H) 04/17/2022 1018   HGB 14.1 04/17/2022 1018   HCT 45.8 04/17/2022 1018   PLT 426 (H) 04/17/2022 1018   MCV 88.8 04/17/2022 1018   NEUTROABS 16.5 (H) 04/06/2022 0819   LYMPHSABS 0.8 04/06/2022 0819   MONOABS 1.2 (H) 04/06/2022 0819   EOSABS 0.5 04/06/2022 0819   BASOSABS 0.1 04/06/2022 0819    Comprehensive Metabolic Panel:    Component Value Date/Time   NA 136 04/16/2022 0402   K 4.3 04/16/2022 0402   CL 97 (L) 04/16/2022 0402   CO2 27 04/16/2022 0402   BUN 13 04/16/2022 0402   CREATININE  0.47 04/18/2022 0430   GLUCOSE 102 (H) 04/16/2022 0402   CALCIUM 9.2 04/16/2022 0402   AST 48 (H) 04/15/2022 1442   ALT 15 04/15/2022 1442   ALKPHOS 561 (H) 04/15/2022 1442   BILITOT 1.2 04/15/2022 1442   PROT 6.9 04/15/2022 1442   ALBUMIN 2.4 (L) 04/15/2022 1442    RADIOGRAPHIC STUDIES: CT ABDOMEN PELVIS W CONTRAST  Result Date: 04/15/2022 CLINICAL DATA:  Abdominal pain and distention EXAM: CT ABDOMEN AND PELVIS WITH CONTRAST TECHNIQUE: Multidetector CT imaging of the abdomen and pelvis was performed using the standard protocol following bolus administration of intravenous contrast. RADIATION DOSE REDUCTION: This exam was performed according to the departmental dose-optimization program which includes automated exposure control, adjustment of the mA and/or kV according to patient size and/or use of iterative reconstruction technique. CONTRAST:  169mL OMNIPAQUE IOHEXOL 300 MG/ML  SOLN COMPARISON:  Previous studies including the CT abdomen and pelvis done on 01/05/2016 and CT chest done on 01/29/2022 FINDINGS: Lower chest: There are multiple nodules of varying sizes in the lower lung fields. There is a 3.2 cm nodule/mass in the left lower lobe. There are small patchy infiltrates in both lower lobes. Minimal bilateral pleural effusions are seen. Dense calcification is seen in mitral annulus. Hepatobiliary: There is interval appearance of multiple low-density space-occupying lesions in liver largest measuring 7.5 x 4.1 cm in the anterior aspect of the liver. There is no dilation of bile ducts. There is increased density in the dependent portion of gallbladder suggesting gallbladder stones. Pancreas: There is atrophy.  No focal abnormalities are seen. Spleen: Unremarkable. Adrenals/Urinary Tract:  There is 11 mm nodule in left adrenal. There are small nodular densities in right adrenal measuring up to 11 mm. There is no hydronephrosis. Lobulations are seen in the margins of the kidneys, possibly suggesting scarring. There is 17 mm calculus in the upper pole of right kidney. There is 12 mm calculus in the midportion. There is 12 mm calculus in the lower pole. Ureters are not dilated. Urinary bladder is distended. There is air in the bladder, possibly suggesting recent instrumentation. Diverticulum is noted in the left posterior aspect of the urinary bladder. Stomach/Bowel: Small hiatal hernia is seen. Stomach is not distended. Small bowel loops not dilated. Appendix is not dilated. Scattered diverticula seen in colon without signs of focal acute diverticulitis. There is wall thickening in 5.7 cm segment of sigmoid colon in midline. Oral contrast has reached the rectum. There is large amount of stool in rectum. Transverse diameter of rectum measures 9.4 cm. There is no significant wall thickening in rectum. Vascular/Lymphatic: Scattered arterial calcifications are seen. Reproductive: There is low-density in the central portion of the uterus. There is 2.2 cm fluid density structure in the right adnexa. Other: There is no ascites or pneumoperitoneum. Umbilical hernia containing fat is seen. Musculoskeletal: There is previous internal fixation in right femur with intramedullary rod. Degenerative changes are noted in right hip. Degenerative changes are noted in lower thoracic spine and lumbar spine. There is fracture in the upper endplate of body of L3 vertebra which may be recent or old. Decrease in height of multiple thoracic vertebral bodies may suggest recent or old compression fractures. There is inhomogeneous in attenuation in the bony structures this may be due to osteoporosis or infiltrative neoplastic process. There is a lytic lesion with adjacent soft tissue mass in the posterior aspect of left iliac  crest. IMPRESSION: There is 5.7 cm mass lesion in sigmoid colon suggesting possible primary malignant neoplasm. There  is no evidence of intestinal obstruction or pneumoperitoneum. There is no hydronephrosis. There are multiple nodules of varying sizes in the visualized lower lung fields consistent with pulmonary metastatic disease. Patchy infiltrates in the lower lung fields may suggest atelectasis/pneumonia. Small bilateral pleural effusions. There are multiple space-occupying lesions of varying sizes in the liver measuring up to 7.5 cm in size suggesting hepatic metastatic disease. There are few nodular densities in both adrenals suggesting possible metastatic disease. There is inhomogeneous attenuation in the bony structures suggesting possible skeletal metastatic disease. There is a lytic lesion in the posterior medial aspect of left iliac crest consistent with skeletal metastatic disease. Other findings as described in the body of the report. Electronically Signed   By: Elmer Picker M.D.   On: 04/15/2022 20:16   CT Maxillofacial W Contrast  Result Date: 04/15/2022 CLINICAL DATA:  Facial abscess EXAM: CT MAXILLOFACIAL WITH CONTRAST TECHNIQUE: Multidetector CT imaging of the maxillofacial structures was performed with intravenous contrast. Multiplanar CT image reconstructions were also generated. RADIATION DOSE REDUCTION: This exam was performed according to the departmental dose-optimization program which includes automated exposure control, adjustment of the mA and/or kV according to patient size and/or use of iterative reconstruction technique. CONTRAST:  156mL OMNIPAQUE IOHEXOL 300 MG/ML  SOLN COMPARISON:  None Available. FINDINGS: Osseous: The right mandibular body is expanded and hyperlucent with the abnormal area encompassing the roots of the right mandibular molars. There is multifocal cortical destruction. There is a surrounding soft tissue component Orbits: Negative. No traumatic or  inflammatory finding. Sinuses: Clear. Soft tissues: Soft tissue swelling surrounding the right mandibular body. Limited intracranial: No significant or unexpected finding. 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 Clinical follow-up after resolution of symptoms is recommended to exclude a superimposed neoplastic process. Electronically Signed   By: Ulyses Jarred M.D.   On: 04/15/2022 20:04   DG Abdomen 1 View  Result Date: 04/15/2022 CLINICAL DATA:  Constipation.  Abdominal distension. EXAM: ABDOMEN - 1 VIEW COMPARISON:  04/09/22 FINDINGS: Gallstones are identified. There is been interval decrease in previously noted air-filled loops of small bowel within the left hemiabdomen. Progressive accumulation of stool is identified throughout the colon and rectum. Large volume of desiccated stool within the rectum has increased in volume compared with the previous study. Cannot rule out rectal impaction. No signs of pneumoperitoneum. Lumbar scoliosis and degenerative disc disease. Status post ORIF of the right femur. Marked degenerative disc disease is noted involving the right hip. Moderate degenerative disc disease noted in the left hip. IMPRESSION: 1. Progressive accumulation of stool throughout the colon and rectum compatible with constipation. Cannot rule out rectal impaction. 2. Interval decrease in previously noted air-filled loops of small bowel within the left hemiabdomen. 3. Gallstones. Electronically Signed   By: Kerby Moors M.D.   On: 04/15/2022 16:13   DG Abd 1 View  Result Date: 04/09/2022 CLINICAL DATA:  Constipation. EXAM: ABDOMEN - 1 VIEW COMPARISON:  Pelvis radiograph 04/02/2022 FINDINGS: Prominent stool noted at the rectum. Mild gaseous distension above this point. No definite free air. Calcifications in the right abdomen likely  gallstones. IMPRESSION: 1. Prominent stool at the rectum with mild gaseous distension above this point. 2. Cholelithiasis. Electronically Signed   By: San Morelle M.D.   On: 04/09/2022 12:27   DG Chest Portable 1 View  Result Date: 04/02/2022 CLINICAL DATA:  Recent fall with right-sided pain, initial encounter  EXAM: PORTABLE CHEST 1 VIEW COMPARISON:  01/28/2022 FINDINGS: Cardiac shadow is within normal limits. Aortic calcifications are again seen. Increased vascular congestion and mild edema is noted. No focal confluent infiltrate is seen. Old rib fractures are noted bilaterally with healing. IMPRESSION: No acute abnormality noted. Electronically Signed   By: Inez Catalina M.D.   On: 04/02/2022 21:50   DG Knee 2 Views Right  Result Date: 04/02/2022 CLINICAL DATA:  Right leg pain following fall, initial encounter EXAM: RIGHT KNEE - 2 VIEW COMPARISON:  None Available. FINDINGS: Postsurgical changes in the distal femur are noted. No acute fracture is noted. IMPRESSION: No acute abnormality noted. Electronically Signed   By: Inez Catalina M.D.   On: 04/02/2022 21:49   DG Hip Unilat W or Wo Pelvis 2-3 Views Right  Result Date: 04/02/2022 CLINICAL DATA:  Right-sided hip pain following fall several hours ago, initial encounter EXAM: DG HIP (WITH OR WITHOUT PELVIS) 2-3V RIGHT COMPARISON:  02/07/2019 FINDINGS: Pelvic ring is intact. Postsurgical changes in the proximal right femur are noted. Degenerative changes of the right hip joint are noted. No acute fracture or dislocation is seen. No soft tissue changes are noted. IMPRESSION: Degenerative change in the right hip joint. No acute abnormality is noted. Electronically Signed   By: Inez Catalina M.D.   On: 04/02/2022 21:48    PERFORMANCE STATUS (ECOG) : 2 - Symptomatic, <50% confined to bed  Review of Systems Unless otherwise noted, a complete review of systems is negative.  Physical Exam General: NAD Pulmonary: Unlabored Abdomen: soft, nontender, +  bowel sounds GU: no suprapubic tenderness Extremities: no edema, no joint deformities Skin: no rashes Neurological: Weakness but otherwise nonfocal  IMPRESSION: Patient has a reasonable understanding of the findings from the work-up thus far.  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  Patient is in agreement with the plan for biopsy and says that she would be interested in speaking with oncology about treatment options once a tissue diagnosis is known.  Patient says that she is not definitively excluding any options for treatment at this time.  At baseline, patient lives at home alone.  She has been at SNF recently following her hospitalization earlier this month.  Patient says that she recognizes that she will likely require increased care at home.  She has been hiring an in-home care aide 3 times weekly.  Patient has been followed by Christin Gusler with [facility] palliative care.  Patient does not have much social support.  Her closest living can is a first cousin Joylene John) who lives near [place].  She says that she sees him and seldom but talks to him regularly on the phone.    Patient does not have any advance directives but is possibly interested in establishing those.  We will consult the chaplain to assist.  Patient would want her cousin to be her 12.  We discussed CODE STATUS.  Initially, patient thought that she would want an attempt at resuscitation given a perception that she thought patient's generally did well and could continue living the same quality of life.  I explained that those outcomes are most often unfavorable in the context of an incurable cancer.  Patient says that she will think about decision-making.  Symptomatically, she has had pain in her jaw at site of known metastasis.  Patient says that pain is reasonably well controlled on as needed oxycodone.  PLAN: -Continue current scope of treatment -Patient would like to proceed with biopsy and  then  further conversations regarding treatment options -Continue pain management with as needed oxycodone -Daily bowel regimen -Chaplain consult to assist with ACP -Will plan to follow patient in the clinic  Case and plan discussed with Dr. Janese Banks   Patient expressed understanding and was in agreement with this plan. She also understands that She can call the clinic at any time with any questions, concerns, or complaints.     Time Total: 60 minutes  Visit consisted of counseling and education dealing with the complex and emotionally intense issues of symptom management and palliative care in the setting of serious and potentially life-threatening illness.Greater than 50%  of this time was spent counseling and coordinating care related to the above assessment and plan.  Signed by: Altha Harm, PhD, NP-C

## 2022-04-19 ENCOUNTER — Inpatient Hospital Stay: Payer: Medicare HMO

## 2022-04-19 DIAGNOSIS — G8929 Other chronic pain: Secondary | ICD-10-CM | POA: Diagnosis not present

## 2022-04-19 DIAGNOSIS — C799 Secondary malignant neoplasm of unspecified site: Secondary | ICD-10-CM | POA: Diagnosis not present

## 2022-04-19 DIAGNOSIS — J9611 Chronic respiratory failure with hypoxia: Secondary | ICD-10-CM | POA: Diagnosis not present

## 2022-04-19 DIAGNOSIS — K6389 Other specified diseases of intestine: Secondary | ICD-10-CM | POA: Diagnosis not present

## 2022-04-19 DIAGNOSIS — M272 Inflammatory conditions of jaws: Secondary | ICD-10-CM | POA: Diagnosis not present

## 2022-04-19 DIAGNOSIS — K5900 Constipation, unspecified: Secondary | ICD-10-CM | POA: Diagnosis not present

## 2022-04-19 LAB — GLUCOSE, CAPILLARY
Glucose-Capillary: 122 mg/dL — ABNORMAL HIGH (ref 70–99)
Glucose-Capillary: 142 mg/dL — ABNORMAL HIGH (ref 70–99)
Glucose-Capillary: 109 mg/dL — ABNORMAL HIGH (ref 70–99)
Glucose-Capillary: 218 mg/dL — ABNORMAL HIGH (ref 70–99)
Glucose-Capillary: 123 mg/dL — ABNORMAL HIGH (ref 70–99)

## 2022-04-19 LAB — CEA: CEA: 595 ng/mL — ABNORMAL HIGH (ref 0.0–4.7)

## 2022-04-19 MED ORDER — MIDAZOLAM HCL 2 MG/2ML IJ SOLN
INTRAMUSCULAR | Status: AC
  Filled 2022-04-19: qty 2

## 2022-04-19 MED ORDER — FENTANYL CITRATE (PF) 100 MCG/2ML IJ SOLN
INTRAMUSCULAR | Status: AC
  Administered 2022-04-19: 50 ug
  Filled 2022-04-19: qty 2

## 2022-04-19 NOTE — Procedures (Signed)
Interventional Radiology Procedure Note  Procedure: US Guided Biopsy of liver mass  Complications: None  Estimated Blood Loss: < 10 mL  Findings: 18 G core biopsy of 7 cm left lobe liver mass performed under US guidance.  Three core samples obtained and sent to Pathology.  Venetia Night. Kathlene Cote, M.D Pager:  [phone]

## 2022-04-19 NOTE — Progress Notes (Signed)
PROGRESS NOTE    NAYDA RIESEN  ZYY:[id number] DOB: 08-25-1946 DOA: 04/15/2022 PCP: Albina Billet, MD    Brief Narrative:  April [name] is a 76 y.o. female with medical history significant for Type II DM, HTN, PVD, recently hospitalized from 7/8 to 7/16 for frequent falls and discharged to SNF, who at that time had chronic leukocytosis of unknown etiology but found to have Klebsiella UTI for which she was treated and who at the same time had a known lung mass seen on CT 01/30/2022 for which she declined treatment or further investigation, evaluated by palliative care prior to discharge, who now returns to the ED with a complaint of abdominal pain, constipation, and pain all over, including pain on her right jaw and ' something going on with her tooth'.  She states her last bowel movement was 2 to 3 weeks prior and she has pain with urination.  She denies fever or chills or cough or shortness of breath. ED course and data review: In the ED intermittently tachycardic to 101.  O2 sat intermittently 90-94 for which she was placed on nasal cannula 2 to 3 L.  Vitals otherwise unremarkable Labs notable for WBC of 27,000 with lactic acid 1.3, up from an high baseline of 19-21,000 during her hospitalization a couple weeks prior.  Platelets 440 and alk phos 561.  The emergency provider spoke with ENT specialist, Dr. Tami Ribas who advised that infection on mandible is possibly related to underlying malignancy so any surgical intervention would likely worsen outcome and thus recommended antibiotic therapy only.  Recommended oral Augmentin, according to discussion with ED provider  7/22 no overnight issues. Oncology was consulted 7/23 plan for ultrasound-guided liver biopsy tomorrow. PT recommends SNF 7/25 s/p liver bx today  Consultants:  Oncology , ENT  Procedures:  CT a/p There is 5.7 cm mass lesion in sigmoid colon suggesting possible primary malignant neoplasm.  There is no evidence of intestinal  obstruction or pneumoperitoneum. There is no hydronephrosis.  There are multiple nodules of varying sizes in the visualized lower lung fields consistent with pulmonary metastatic disease. Patchy infiltrates in the lower lung fields may suggest atelectasis/pneumonia. Small bilateral pleural effusions.  There are multiple space-occupying lesions of varying sizes in the liver measuring up to 7.5 cm in size suggesting hepatic metastatic disease.  There are few nodular densities in both adrenals suggesting possible metastatic disease.  There is inhomogeneous attenuation in the bony structures suggesting possible skeletal metastatic disease. There is a lytic lesion in the posterior medial aspect of left iliac crest consistent with skeletal metastatic disease.  Other findings as described in the body of the report.  CT max Expanded right mandibular body with surrounding soft tissue and multifocal cortical destruction, likely indicating active osteomyelitis with an intra osseous necrotic or purulence collection. Clinical follow-up after resolution of symptoms is recommended to exclude a superimposed neoplastic process.     Antimicrobials:  Unasyn   Subjective: Denies, pain, sob, cp.  Objective: Vitals:   04/18/22 1738 04/18/22 2142 04/18/22 2307 04/19/22 0235  BP: 132/68 131/70 126/60 128/73  Pulse: (!) 102 (!) 103 (!) 108 94  Resp: '16 18  17  ' Temp: 98.2 F (36.8 C) 98.1 F (36.7 C)  98.6 F (37 C)  TempSrc: Oral   Oral  SpO2: 93% 92%  95%  Weight:      Height:        Intake/Output Summary (Last 24 hours) at 04/19/2022 0813 Last data filed at 04/19/2022 [phone]  Gross per 24 hour  Intake --  Output 1300 ml  Net -1300 ml   Filed Weights   04/15/22 1441  Weight: 91.4 kg    Examination: Calm, NAD Cta no w/r anteriorly Reg s1/s2 no gallop Soft benign +bs No edema Awake and alert Mood and affect appropriate in current setting     Data Reviewed: I have personally  reviewed following labs and imaging studies  CBC: Recent Labs  Lab 04/15/22 1442 04/16/22 0402 04/17/22 1018  WBC 27.6* 28.4* 25.4*  HGB 14.4 14.0 14.1  HCT 46.2* 45.0 45.8  MCV 85.4 87.2 88.8  PLT 440* 457* 287*   Basic Metabolic Panel: Recent Labs  Lab 04/15/22 1442 04/16/22 0402 04/17/22 0451 04/18/22 0430  NA 132* 136  --   --   K 3.5 4.3  --   --   CL 96* 97*  --   --   CO2 27 27  --   --   GLUCOSE 98 102*  --   --   BUN 14 13  --   --   CREATININE 0.41* 0.50 0.49 0.47  CALCIUM 9.0 9.2  --   --    GFR: Estimated Creatinine Clearance: 69.2 mL/min (by C-G formula based on SCr of 0.47 mg/dL). Liver Function Tests: Recent Labs  Lab 04/15/22 1442  AST 48*  ALT 15  ALKPHOS 561*  BILITOT 1.2  PROT 6.9  ALBUMIN 2.4*   Recent Labs  Lab 04/15/22 1442  LIPASE 24   No results for input(s): "AMMONIA" in the last 168 hours. Coagulation Profile: Recent Labs  Lab 04/18/22 0430  INR 1.2   Cardiac Enzymes: No results for input(s): "CKTOTAL", "CKMB", "CKMBINDEX", "TROPONINI" in the last 168 hours. BNP (last 3 results) No results for input(s): "PROBNP" in the last 8760 hours. HbA1C: No results for input(s): "HGBA1C" in the last 72 hours. CBG: Recent Labs  Lab 04/18/22 0747 04/18/22 1204 04/18/22 1713 04/18/22 2210 04/19/22 0801  GLUCAP 107* 98 153* 138* 122*   Lipid Profile: No results for input(s): "CHOL", "HDL", "LDLCALC", "TRIG", "CHOLHDL", "LDLDIRECT" in the last 72 hours. Thyroid Function Tests: No results for input(s): "TSH", "T4TOTAL", "FREET4", "T3FREE", "THYROIDAB" in the last 72 hours. Anemia Panel: No results for input(s): "VITAMINB12", "FOLATE", "FERRITIN", "TIBC", "IRON", "RETICCTPCT" in the last 72 hours. Sepsis Labs: Recent Labs  Lab 04/15/22 1911 04/17/22 0451  LATICACIDVEN 1.3 1.7    Recent Results (from the past 240 hour(s))  Blood culture (routine x 2)     Status: Abnormal   Collection Time: 04/15/22  7:11 PM   Specimen:  BLOOD LEFT WRIST  Result Value Ref Range Status   Specimen Description   Final    BLOOD LEFT WRIST Performed at [facility], [address], [place], [place] [address]    Special Requests   Final    BOTTLES DRAWN AEROBIC AND ANAEROBIC Blood Culture adequate volume Performed at [facility], [address], [place],  [address]    Culture  Setup Time   Final    GRAM POSITIVE COCCI ANAEROBIC BOTTLE ONLY Organism ID to follow CRITICAL RESULT CALLED TO, READ BACK BY AND VERIFIED WITH: RODNEY GRUBB 04/16/22 1451 DE    Culture (A)  Final    STAPHYLOCOCCUS EPIDERMIDIS THE SIGNIFICANCE OF ISOLATING THIS ORGANISM FROM A SINGLE SET OF BLOOD CULTURES WHEN MULTIPLE SETS ARE DRAWN IS UNCERTAIN. PLEASE NOTIFY THE MICROBIOLOGY DEPARTMENT WITHIN ONE WEEK IF SPECIATION AND SENSITIVITIES ARE REQUIRED. Performed at [facility], [address] [place], [place] [address]    Report Status 04/18/2022 FINAL  Final  Blood Culture ID Panel (Reflexed)     Status: Abnormal   Collection Time: 04/15/22  7:11 PM  Result Value Ref Range Status   Enterococcus faecalis NOT DETECTED NOT DETECTED Final   Enterococcus Faecium NOT DETECTED NOT DETECTED Final   Listeria monocytogenes NOT DETECTED NOT DETECTED Final   Staphylococcus species DETECTED (A) NOT DETECTED Final    Comment: CRITICAL RESULT CALLED TO, READ BACK BY AND VERIFIED WITH: RODNEY GRUBB 04/16/22 1451 DE    Staphylococcus aureus (BCID) NOT DETECTED NOT DETECTED Final   Staphylococcus epidermidis DETECTED (A) NOT DETECTED Final    Comment: Methicillin (oxacillin) resistant coagulase negative staphylococcus. Possible blood culture contaminant (unless isolated from more than one blood culture draw or clinical case suggests pathogenicity). No antibiotic treatment is indicated for blood  culture contaminants. CRITICAL RESULT CALLED TO, READ BACK BY AND VERIFIED WITH: RODNEY GRUBB 04/16/22 1451 DE    Staphylococcus  lugdunensis NOT DETECTED NOT DETECTED Final   Streptococcus species NOT DETECTED NOT DETECTED Final   Streptococcus agalactiae NOT DETECTED NOT DETECTED Final   Streptococcus pneumoniae NOT DETECTED NOT DETECTED Final   Streptococcus pyogenes NOT DETECTED NOT DETECTED Final   A.calcoaceticus-baumannii NOT DETECTED NOT DETECTED Final   Bacteroides fragilis NOT DETECTED NOT DETECTED Final   Enterobacterales NOT DETECTED NOT DETECTED Final   Enterobacter cloacae complex NOT DETECTED NOT DETECTED Final   Escherichia coli NOT DETECTED NOT DETECTED Final   Klebsiella aerogenes NOT DETECTED NOT DETECTED Final   Klebsiella oxytoca NOT DETECTED NOT DETECTED Final   Klebsiella pneumoniae NOT DETECTED NOT DETECTED Final   Proteus species NOT DETECTED NOT DETECTED Final   Salmonella species NOT DETECTED NOT DETECTED Final   Serratia marcescens NOT DETECTED NOT DETECTED Final   Haemophilus influenzae NOT DETECTED NOT DETECTED Final   Neisseria meningitidis NOT DETECTED NOT DETECTED Final   Pseudomonas aeruginosa NOT DETECTED NOT DETECTED Final   Stenotrophomonas maltophilia NOT DETECTED NOT DETECTED Final   Candida albicans NOT DETECTED NOT DETECTED Final   Candida auris NOT DETECTED NOT DETECTED Final   Candida glabrata NOT DETECTED NOT DETECTED Final   Candida krusei NOT DETECTED NOT DETECTED Final   Candida parapsilosis NOT DETECTED NOT DETECTED Final   Candida tropicalis NOT DETECTED NOT DETECTED Final   Cryptococcus neoformans/gattii NOT DETECTED NOT DETECTED Final   Methicillin resistance mecA/C DETECTED (A) NOT DETECTED Final    Comment: CRITICAL RESULT CALLED TO, READ BACK BY AND VERIFIED WITH: RODNEY GRUBB 04/16/22 1451 Performed at [facility], [address] [place], [place] [address]   Culture, blood (Routine X 2) w Reflex to ID Panel     Status: None (Preliminary result)   Collection Time: 04/17/22  4:51 AM   Specimen: BLOOD  Result Value Ref Range Status   Specimen  Description BLOOD LEFT ANTECUBITAL  Final   Special Requests   Final    BOTTLES DRAWN AEROBIC AND ANAEROBIC Blood Culture results may not be optimal due to an inadequate volume of blood received in culture bottles   Culture   Final    NO GROWTH 2 DAYS Performed at [facility], [address] [place], [place] [address]    Report Status PENDING  Incomplete         Radiology Studies: No results found.      Scheduled Meds:  amLODipine  5 mg Oral Daily   bisacodyl  10 mg Rectal Once   chlorhexidine  10 mL Mouth/Throat QID   docusate sodium  100 mg Oral BID   empagliflozin  10 mg Oral Daily   enoxaparin (LOVENOX) injection  0.5 mg/kg Subcutaneous Q24H   gabapentin  300 mg Oral TID   insulin aspart  0-15 Units Subcutaneous TID WC   insulin aspart  0-5 Units Subcutaneous QHS   metFORMIN  500 mg Oral BID   metoprolol tartrate  25 mg Oral BID   polyethylene glycol  17 g Oral BID   pravastatin  40 mg Oral Daily   Continuous Infusions:  ampicillin-sulbactam (UNASYN) IV 3 g (04/19/22 0408)    Assessment & Plan:   Principal Problem:   Constipation Active Problems:   Colon cancer metastasized to liver, lung and bone (HCC)   Acute osteomyelitis of mandible   Type 2 diabetes mellitus (HCC)   PAD (peripheral artery disease) (HCC)   Essential hypertension   Chronic pain   Frailty syndrome in geriatric patient   Chronic respiratory failure with hypoxia ([place])   Osteomyelitis of mandible   Goals of care, counseling/discussion   Colonic mass   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 continue bowel regimen .  Bisocodyl suppository x1 7/24 oncology consulted input appreciated.  Recommend ultrasound-guided liver biopsy for tissue diagnosis and then go from there.  CEA for tumor marker testing also will be ordered by oncology 7/25 s/p liver bx today by IR   Acute osteomyelitis of mandible Possible metastatic lesion right mandible Patient has right-sided jaw pain and swelling with leukocytosis of 27,000 and abnormal CT findings CT shows: Expanded right mandibular body with surrounding soft tissue and multifocal cortical destruction, likely indicating active osteomyelitis with an intra osseous necrotic or purulence collection Continue Unasyn and transition to Augmentin for discharge ENT consult to follow, though based on conversation with ED provider no surgical intervention recommended due to likely malignant and infectious component Pain control Continue unasyn. Spoke to ID Dr. Linus Salmons on call, recommended on discharge 4 weeks of augmentin with f/u with ENT or ID. 7/23 ENT input appreciated. Neoplasm uncertain behavior mandible with 2/2 infection. No acute abscess per ENT. No drainage , likely some dead necrotic tissue with 2/2ary infection.  Peridex mouth rinse  ?ENT bx possibly if needed by oncology 7/25 continue IV antibiotic        Chronic respiratory failure with Hypoxia Was discharged previously on 2L on 7/16.  Likely from lung mets and possibly underlying OSA 7/25 continue 02 suppl. Keeping sat >92%         Frailty syndrome in geriatric patient Patient with a history of recent frequent falls resulting in injury, untreated metastatic cancer, recently hospitalized and discharged to SNF [facility] and nutritionist consult 7/25 PT recommends SNF      Essential HTN Bp has  room for improvement Add amlodipine 7m qd Continue beta blk   Chronic pain Continue gabapentin and acetaminophen   PAD (peripheral artery disease) (HCobb Continue pravastatin  Type 2 diabetes mellitus (HCC) Continue Jardiance and metformin and sliding scale insulin coverage         DVT prophylaxis: lovenox Code Status:full Family Communication: none Disposition Plan:  Status is: inpatient The patient remains inpatient due to need of pain mx, w/u pending , and iv treatment        LOS: 3 days   Time spent: 35 min    Nolberto Hanlon, MD [facility] Pager [phone]  If 7PM-7AM, please contact night-coverage 04/19/2022, 8:13 AM

## 2022-04-19 NOTE — NC FL2 (Signed)
[place] LEVEL OF CARE SCREENING TOOL     IDENTIFICATION  Patient Name: April [name] Birthdate: 09-Mar-1946 Sex: female Admission Date (Current Location): 04/15/2022  [facility] and [place] Number:  Engineering geologist and Address:         Provider Number: [id number]  Attending Physician Name and Address:  Nolberto Hanlon, MD  Relative Name and Phone Number:       Current Level of Care: Hospital Recommended Level of Care: [place] Prior Approval Number:    Date Approved/Denied:   PASRR Number: [id number]  Discharge Plan: SNF    Current Diagnoses: Patient Active Problem List   Diagnosis Date Noted   Palliative care encounter    Goals of care, counseling/discussion    Colonic mass    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 [facility])    Chronic respiratory failure with hypoxia ([place]) 04/16/2022   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   Colon cancer metastasized to liver, lung and bone ([place]) 04/15/2022   Acute osteomyelitis of mandible 04/15/2022   Chronic pain 04/15/2022   Constipation 04/15/2022   Frailty syndrome in geriatric patient 04/15/2022   Vitamin D deficiency 04/05/2022   UTI (urinary tract infection) 04/05/2022   Fall at home, initial encounter 04/03/2022   Frequent falls 04/03/2022   Mass of lower lobe of left lung on CT chest 01/30/22    Closed displaced fracture of surgical neck of humerus, unspecified fracture morphology, initial encounter    Acute cystitis without hematuria    Abnormal CT of the chest    Hypoxia 01/28/2022   Lymphedema 07/02/2019   S/P right hip fracture 02/05/2019   PAD (peripheral artery disease) ([place]) 10/03/2017   Essential hypertension 10/03/2017   Pain in limb 03/21/2017   Swelling of limb 10/18/2016   Ulcer of calf ([place]) 10/18/2016   Gangrene of foot ([place]) 03/03/2016   UPJ (ureteropelvic junction) obstruction    Paroxysmal atrial fibrillation ([place]) 12/04/2015   Back pain  12/04/2015   Thrombocytopenia ([place]) 12/03/2015   Leukocytosis 12/03/2015   Type 2 diabetes mellitus ([place]) 12/03/2015   Respiratory failure ([place])    Proteus septicemia ([place])    Acute renal failure (ARF) ([place])    Hydronephrosis    Endotracheally intubated    Respiratory distress    Arterial hypotension    Sepsis ([place]) 10/20/2015   Acute renal failure (HCC)    Altered mental status    NSTEMI (non-ST elevated myocardial infarction) ([place])    Sepsis due to urinary tract infection ([place])     Orientation RESPIRATION BLADDER Height & Weight     Self, Time, Situation, Place  O2 (2L [place]) Incontinent Weight: 91.4 kg Height:  5\' 6"  (167.6 cm)  BEHAVIORAL SYMPTOMS/MOOD NEUROLOGICAL BOWEL NUTRITION STATUS      Incontinent Diet (Heart Healthy)  AMBULATORY STATUS COMMUNICATION OF NEEDS Skin   Extensive Assist Verbally Bruising                       Personal Care Assistance Level of Assistance              Functional Limitations Info             SPECIAL CARE FACTORS FREQUENCY  PT (By licensed PT), OT (By licensed OT)                    Contractures Contractures Info: Not present    Additional Factors Info  Code Status Code  Status Info: Full Allergies Info: Prednisone, Other           Current Medications (04/19/2022):  This is the current hospital active medication list Current Facility-Administered Medications  Medication Dose Route Frequency Provider Last Rate Last Admin   acetaminophen (TYLENOL) tablet 650 mg  650 mg Oral Q6H PRN Athena Masse, MD   650 mg at 04/18/22 2038   Or   acetaminophen (TYLENOL) suppository 650 mg  650 mg Rectal Q6H PRN Athena Masse, MD       amLODipine (NORVASC) tablet 5 mg  5 mg Oral Daily Nolberto Hanlon, MD   5 mg at 04/19/22 1137   Ampicillin-Sulbactam (UNASYN) 3 g in sodium chloride 0.9 % 100 mL IVPB  3 g Intravenous Q6H Chinita Greenland A, RPH 200 mL/hr at 04/19/22 1141 3 g at 04/19/22 1141   bisacodyl (DULCOLAX) suppository  10 mg  10 mg Rectal Once Nolberto Hanlon, MD       chlorhexidine (PERIDEX) 0.12 % solution 10 mL  10 mL Mouth/Throat QID Beverly Gust, MD   10 mL at 04/18/22 2301   docusate sodium (COLACE) capsule 100 mg  100 mg Oral BID Judd Gaudier V, MD   100 mg at 04/18/22 2302   empagliflozin (JARDIANCE) tablet 10 mg  10 mg Oral Daily Judd Gaudier V, MD   10 mg at 04/17/22 0918   enoxaparin (LOVENOX) injection 45 mg  0.5 mg/kg Subcutaneous Q24H Judd Gaudier V, MD   45 mg at 04/17/22 2245   gabapentin (NEURONTIN) capsule 300 mg  300 mg Oral TID Athena Masse, MD   300 mg at 04/18/22 2302   insulin aspart (novoLOG) injection 0-15 Units  0-15 Units Subcutaneous TID WC Athena Masse, MD   2 Units at 04/19/22 1208   insulin aspart (novoLOG) injection 0-5 Units  0-5 Units Subcutaneous QHS Judd Gaudier V, MD       metFORMIN (GLUCOPHAGE) tablet 500 mg  500 mg Oral BID Judd Gaudier V, MD   500 mg at 04/18/22 2038   metoprolol tartrate (LOPRESSOR) tablet 25 mg  25 mg Oral BID Judd Gaudier V, MD   25 mg at 04/19/22 1137   morphine (PF) 2 MG/ML injection 2 mg  2 mg Intravenous Q2H PRN Athena Masse, MD   2 mg at 04/19/22 0233   ondansetron (ZOFRAN) tablet 4 mg  4 mg Oral Q6H PRN Athena Masse, MD       Or   ondansetron [facility]) injection 4 mg  4 mg Intravenous Q6H PRN Athena Masse, MD   4 mg at 04/16/22 0057   oxyCODONE (Oxy IR/ROXICODONE) immediate release tablet 5 mg  5 mg Oral Q6H PRN Nolberto Hanlon, MD   5 mg at 04/19/22 0542   polyethylene glycol (MIRALAX / GLYCOLAX) packet 17 g  17 g Oral BID Nolberto Hanlon, MD   17 g at 04/18/22 2301   pravastatin (PRAVACHOL) tablet 40 mg  40 mg Oral Daily Athena Masse, MD   40 mg at 04/17/22 7408   sodium phosphate (FLEET) 7-19 GM/118ML enema 1 enema  1 enema Rectal Daily PRN Nolberto Hanlon, MD         Discharge Medications: Please see discharge summary for a list of discharge medications.  Relevant Imaging Results:  Relevant Lab Results:   Additional  Information SSN [id number]  Beverly Sessions, RN

## 2022-04-19 NOTE — Progress Notes (Signed)
Patient clinically stable post Liver biopsy per DR Kathlene Cote, tolerated well with local along with Fentanyl 50 mcg IV given. Vitals stable pre and post procedure. Report given to Vicente Males RN post procedure/1A at bedside post procedure/recovery. Denies complaints post procedure.

## 2022-04-19 NOTE — Progress Notes (Signed)
  Chaplain On-Call responded to [place] Order for Advance Directives information for the patient.  Chaplain attempted to discuss the information with the patient. However, the patient was crying out continuously for something to eat, and was not able to have conversation.  Chaplain left the AD documents on bedside table and reported the visit to [facility], the patient's Nurse, who stated that she is aware of the patient crying out.  Chaplain Pollyann Samples M.Div., [facility]

## 2022-04-19 NOTE — TOC Initial Note (Signed)
Transition of Care [facility]) - Initial/Assessment Note    Patient Details  Name: April [name] MRN: [id number] Date of Birth: [birth year]  Transition of Care [facility]) CM/SW Contact:    Beverly Sessions, RN Phone Number: 04/19/2022, 2:14 PM  Clinical Narrative:                  Admitted for: Constipation  Admitted from: From home PCP: Hall Busing Current home health/prior home health/DME:  Shower seat, BSC, rollator   Therapy recommending SNF patient agreeable. Recent discharge from [facility] and patient not agreeable to go there again.  Fl2 sent for signature  Bed search initiated Not determined at this point if patent will need chemo at discharge.  I have noted this in the comment to facilities        Patient Goals and CMS Choice        Expected Discharge Plan and Services           Expected Discharge Date: 04/17/22                                    Prior Living Arrangements/Services                       Activities of Daily Living Home Assistive Devices/Equipment: Kasandra Knudsen (specify quad or straight), Walker (specify type), Shower chair with back, Grab bars around toilet, Grab bars in shower ADL Screening (condition at time of admission) Patient's cognitive ability adequate to safely complete daily activities?: Yes Is the patient deaf or have difficulty hearing?: No Does the patient have difficulty seeing, even when wearing glasses/contacts?: Yes Does the patient have difficulty concentrating, remembering, or making decisions?: No Patient able to express need for assistance with ADLs?: Yes Does the patient have difficulty dressing or bathing?: Yes Independently performs ADLs?: No Communication: Independent Dressing (OT): Needs assistance Is this a change from baseline?: Pre-admission baseline Grooming: Independent Is this a change from baseline?: Pre-admission baseline Feeding: Independent Bathing: Needs assistance Is this a change from  baseline?: Pre-admission baseline Toileting: Needs assistance Is this a change from baseline?: Pre-admission baseline In/Out Bed: Needs assistance Is this a change from baseline?: Pre-admission baseline Walks in Home: Needs assistance Is this a change from baseline?: Pre-admission baseline Does the patient have difficulty walking or climbing stairs?: Yes Weakness of Legs: Both Weakness of Arms/Hands: Both  Permission Sought/Granted                  Emotional Assessment              Admission diagnosis:  Colonic mass [K63.89] 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] Constipation, unspecified constipation type [K59.00] Multiple lesions of metastatic malignancy ([place]) [C79.9] Acute hematogenous osteomyelitis of other site [facility]) [M86.08] Patient Active Problem List   Diagnosis Date Noted   Palliative care encounter    Goals of care, counseling/discussion    Colonic mass    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 [facility])    Chronic respiratory failure with hypoxia ([place]) 04/16/2022   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   Colon cancer metastasized to liver, lung and bone ([place]) 04/15/2022   Acute osteomyelitis of mandible 04/15/2022   Chronic pain 04/15/2022   Constipation 04/15/2022   Frailty syndrome in geriatric patient 04/15/2022   Vitamin D deficiency 04/05/2022   UTI (urinary tract infection) 04/05/2022   Fall at home, initial encounter 04/03/2022  Frequent falls 04/03/2022   Mass of lower lobe of left lung on CT chest 01/30/22    Closed displaced fracture of surgical neck of humerus, unspecified fracture morphology, initial encounter    Acute cystitis without hematuria    Abnormal CT of the chest    Hypoxia 01/28/2022   Lymphedema 07/02/2019   S/P right hip fracture 02/05/2019   PAD (peripheral artery disease) ([place]) 10/03/2017   Essential hypertension 10/03/2017   Pain in limb 03/21/2017   Swelling of limb 10/18/2016   Ulcer of calf ([place])  10/18/2016   Gangrene of foot ([place]) 03/03/2016   UPJ (ureteropelvic junction) obstruction    Paroxysmal atrial fibrillation (H[facility]) 12/04/2015   Back pain 12/04/2015   Thrombocytopenia ([place]) 12/03/2015   Leukocytosis 12/03/2015   Type 2 diabetes mellitus ([place]) 12/03/2015   Respiratory failure ([place])    Proteus septicemia ([place])    Acute renal failure (ARF) ([place])    Hydronephrosis    Endotracheally intubated    Respiratory distress    Arterial hypotension    Sepsis ([place]) 10/20/2015   Acute renal failure (H[facility])    Altered mental status    NSTEMI (non-ST elevated myocardial infarction) ([place])    Sepsis due to urinary tract infection ([place])    PCP:  Albina Billet, MD Pharmacy:   [id number] [place], [place] [address] [address] [place] [address] Phone: [phone] Fax: [phone]  [place], [place] [address] Phone: [phone] Fax: [phone]     Social Determinants of Health (SDOH) Interventions    Readmission Risk Interventions    04/04/2022    1:53 PM  Readmission Risk Prevention Plan  Post Dischage Appt Complete  Medication Screening Complete  Transportation Screening Complete

## 2022-04-19 NOTE — Progress Notes (Addendum)
Occupational Therapy Treatment Patient Details Name: April [name] MRN: [id number] DOB: 12/07/1945 Today's Date: 04/19/2022   History of present illness Pt.  is an 76 y.o. 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   OT comments  Pt. perseverated on being cold during the session. Pt. was assisted with multiple blankets, and adjusting the thermostat within pt.'s range preference. Pt. required maxA for repositioning for comfort. Pt. Required repositioning multiple times during the session secondary to LE pain. Pt. presents with right shoulder pain with attempts for ROM contributing to limited tolerance for ROM. Pt. will benefit from OT services for ADL training, A/E training, there. Ex, and pt. education about positioning, home modification, and DME. Pt. Is most appropriate for SNF level of care upon discharge, with follow-up OT services.    Recommendations for follow up therapy are one component of a multi-disciplinary discharge planning process, led by the attending physician.  Recommendations may be updated based on patient status, additional functional criteria and insurance authorization.    Follow Up Recommendations  Skilled nursing-short term rehab (<3 hours/day)    Assistance Recommended at Discharge Frequent or constant Supervision/Assistance  Patient can return home with the following  A lot of help with walking and/or transfers;A lot of help with bathing/dressing/bathroom;Assistance with cooking/housework;Direct supervision/assist for medications management   Equipment Recommendations       Recommendations for Other Services Other (comment)    Precautions / Restrictions Precautions Precautions: Shoulder;Fall Type of Shoulder Precautions: Per Previous encounter: Pt 2 months out humeral fracture. Per Dr.  Sharlet Salina, allowed gentle strengthening and shoulder mobility. Sling no longer required, no WB restrictions Precaution Booklet Issued: No Restrictions Weight Bearing Restrictions: Yes RUE Weight Bearing: Weight bearing as tolerated       Mobility Bed Mobility               General bed mobility comments: MaxA to reposition in the bed.    Transfers                   General transfer comment: Deferred     Balance                                           ADL either performed or assessed with clinical judgement   ADL                                         General ADL Comments: Max A ADLs.    Extremity/Trunk Assessment Upper Extremity Assessment RUE Deficits / Details: Limited tolerance for right shoulder ROM RUE: Shoulder pain with ROM            Vision Patient Visual Report: No change from baseline     Perception     Praxis      Cognition Arousal/Alertness: Awake/alert Behavior During Therapy: WFL for tasks assessed/performed Overall Cognitive Status: No family/caregiver present to determine baseline cognitive functioning  Exercises      Shoulder Instructions       General Comments      Pertinent Vitals/ Pain       Pain Assessment Pain Assessment: 0-10 Pain Location: LEs, RUE Pain Descriptors / Indicators: Discomfort Pain Intervention(s): Limited activity within patient's tolerance  Home Living                                          Prior Functioning/Environment              Frequency  Min 2X/week        Progress Toward Goals  OT Goals(current goals can now be found in the care plan section)  Progress towards OT goals: Progressing toward goals  Acute Rehab OT Goals Patient Stated Goal: To warm up secondary to being cold., OT Goal Formulation: With patient Time For Goal Achievement: 05/02/22 Potential to  Achieve Goals: Good  Plan      Co-evaluation                 AM-PAC OT "6 Clicks" Daily Activity     Outcome Measure   Help from another person eating meals?: A Little Help from another person taking care of personal grooming?: A Lot Help from another person toileting, which includes using toliet, bedpan, or urinal?: A Lot Help from another person bathing (including washing, rinsing, drying)?: A Lot Help from another person to put on and taking off regular upper body clothing?: A Lot Help from another person to put on and taking off regular lower body clothing?: A Lot 6 Click Score: 13    End of Session Equipment Utilized During Treatment: Gait belt  OT Visit Diagnosis: Unsteadiness on feet (R26.81);Muscle weakness (generalized) (M62.81)   Activity Tolerance Patient tolerated treatment well   Patient Left in bed;with call bell/phone within reach;with bed alarm set   Nurse Communication          Time: 7096-2836 OT Time Calculation (min): 25 min  Charges: OT General Charges $OT Visit: 1 Visit OT Treatments $Self Care/Home Management : 23-37 mins  Harrel Carina, MS, OTR/L   Harrel Carina 04/19/2022, 5:08 PM

## 2022-04-19 NOTE — Progress Notes (Signed)
Hematology/Oncology Progress note [phone] [phone]     Patient Care Team: Albina Billet, MD as PCP - General (Internal Medicine)   Name of the patient: April [name]  [id number]  March 04, 1946  Date of visit: 04/19/22   INTERVAL HISTORY-  Patient was seen this afternoon.  Neighbor Francesca Jewett was at the bedside. + Back pain.  Status post ultrasound-guided liver biopsy today.  Result is pending.    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   Colon cancer metastasized to liver, lung and bone ([place]) 04/15/2022   Acute osteomyelitis of mandible 04/15/2022   Chronic pain 04/15/2022   Constipation 04/15/2022   Frailty syndrome in geriatric patient 04/15/2022   Vitamin D deficiency 04/05/2022   UTI (urinary tract infection) 04/05/2022   Fall at home, initial encounter 04/03/2022   Frequent falls 04/03/2022   Mass of lower lobe of left lung on CT chest 01/30/22    Closed displaced fracture of surgical neck of humerus, unspecified fracture morphology, initial encounter    Acute cystitis without hematuria    Abnormal CT of the chest    Hypoxia 01/28/2022   Lymphedema 07/02/2019   S/P right hip fracture 02/05/2019   PAD (peripheral artery disease) ([place]) 10/03/2017   Essential hypertension 10/03/2017   Pain in limb 03/21/2017   Swelling of limb 10/18/2016   Ulcer of calf ([place]) 10/18/2016   Gangrene of foot ([place]) 03/03/2016   UPJ (ureteropelvic junction) obstruction    Paroxysmal atrial fibrillation (HCC) 12/04/2015   Back pain 12/04/2015   Thrombocytopenia ([place]) 12/03/2015   Leukocytosis 12/03/2015   Type 2 diabetes  mellitus ([place]) 12/03/2015   Respiratory failure ([place])    Proteus septicemia ([place])    Acute renal failure (ARF) ([place])    Hydronephrosis    Endotracheally intubated    Respiratory distress    Arterial hypotension    Sepsis ([place]) 10/20/2015   Acute renal failure (HCC)    Altered mental status    NSTEMI (non-ST elevated myocardial infarction) ([place])    Sepsis due to urinary tract infection [facility])      Past Medical History:  Diagnosis Date   Acute renal failure (ARF) ([place])    Difficult intubation    Hypertension    NSTEMI (non-ST elevated myocardial infarction) ([place])    Paroxysmal atrial fibrillation ([place])    Renal disorder    Sepsis [facility]) January 2017   [facility]   Type 2 diabetes mellitus [facility])      Past Surgical History:  Procedure Laterality Date   broken hip  2019   CYSTOSCOPY WITH STENT PLACEMENT Right 11/03/2015   Procedure: CYSTOSCOPY WITH STENT PLACEMENT;  Surgeon: Hollice Espy, MD;  Location: [facility] ORS;  Service: Urology;  Laterality: Right;   FRACTURE SURGERY Right    Elbow   INTRAMEDULLARY (IM) NAIL INTERTROCHANTERIC Right 02/07/2019   Procedure: INTRAMEDULLARY (IM) NAIL INTERTROCHANTRIC;  Surgeon: Thornton Park, MD;  Location: [facility] ORS;  Service: Orthopedics;  Laterality: Right;   TONSILLECTOMY     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   Procedure: TRANSMETATARSAL AMPUTATION;  Surgeon: Algernon Huxley, MD;  Location: [facility] ORS;  Service: Vascular;  Laterality: Bilateral;    Social History   Socioeconomic History   Marital status: Single    Spouse name: Not on file   Number of children: Not on file   Years of education: Not on file   Highest education level: Not on file  Occupational History   Not on file  Tobacco Use   Smoking status: Never   Smokeless tobacco: Never  Substance and Sexual Activity   Alcohol use: No    Alcohol/week: 0.0 standard drinks of alcohol   Drug use: No   Sexual activity: Not on file  Other Topics Concern   Not on file   Social History Narrative   Not on file   Social Determinants of Health   Financial Resource Strain: Not on file  Food Insecurity: Not on file  Transportation Needs: Not on file  Physical Activity: Not on file  Stress: Not on file  Social Connections: Not on file  Intimate Partner Violence: Not on file     Family History  Problem Relation Age of Onset   Diabetes Father    Heart disease Father    Stroke Maternal Grandmother      Current Facility-Administered Medications:    acetaminophen (TYLENOL) tablet 650 mg, 650 mg, Oral, Q6H PRN, 650 mg at 04/18/22 2038 **OR** acetaminophen (TYLENOL) suppository 650 mg, 650 mg, Rectal, Q6H PRN, Athena Masse, MD   amLODipine (NORVASC) tablet 5 mg, 5 mg, Oral, Daily, Amery, Sahar, MD, 5 mg at 04/19/22 1137   Ampicillin-Sulbactam (UNASYN) 3 g in sodium chloride 0.9 % 100 mL IVPB, 3 g, Intravenous, Q6H, Merrill, Kristin A, RPH, Last Rate: 200 mL/hr at 04/19/22 1141, 3 g at 04/19/22 1141   bisacodyl (DULCOLAX) suppository 10 mg, 10 mg, Rectal, Once, Nolberto Hanlon, MD   chlorhexidine (PERIDEX) 0.12 % solution 10 mL, 10 mL, Mouth/Throat, QID, Beverly Gust, MD, 10 mL at 04/18/22 2301   docusate sodium (COLACE) capsule 100 mg, 100 mg, Oral, BID, Judd Gaudier V, MD, 100 mg at 04/18/22 2302   empagliflozin (JARDIANCE) tablet 10 mg, 10 mg, Oral, Daily, Judd Gaudier V, MD, 10 mg at 04/17/22 0918   enoxaparin (LOVENOX) injection 45 mg, 0.5 mg/kg, Subcutaneous, Q24H, Judd Gaudier V, MD, 45 mg at 04/17/22 2245   gabapentin (NEURONTIN) capsule 300 mg, 300 mg, Oral, TID, Judd Gaudier V, MD, 300 mg at 04/18/22 2302   insulin aspart (novoLOG) injection 0-15 Units, 0-15 Units, Subcutaneous, TID WC, Athena Masse, MD, 2 Units at 04/19/22 1208   insulin aspart (novoLOG) injection 0-5 Units, 0-5 Units, Subcutaneous, QHS, Judd Gaudier V, MD   metFORMIN (GLUCOPHAGE) tablet 500 mg, 500 mg, Oral, BID, Judd Gaudier V, MD, 500 mg at 04/18/22 2038   metoprolol  tartrate (LOPRESSOR) tablet 25 mg, 25 mg, Oral, BID, Judd Gaudier V, MD, 25 mg at 04/19/22 1137   morphine (PF) 2 MG/ML injection 2 mg, 2 mg, Intravenous, Q2H PRN, Judd Gaudier V, MD, 2 mg at 04/19/22 0233   ondansetron (ZOFRAN) tablet 4 mg, 4 mg, Oral, Q6H PRN **OR** ondansetron (ZOFRAN) injection 4 mg, 4 mg, Intravenous, Q6H PRN, Judd Gaudier V, MD, 4 mg at 04/16/22 0057   oxyCODONE (Oxy IR/ROXICODONE) immediate release tablet 5 mg, 5 mg, Oral, Q6H PRN, Nolberto Hanlon, MD, 5 mg at 04/19/22 0542   polyethylene glycol (MIRALAX / GLYCOLAX) packet 17 g, 17 g, Oral, BID, Amery, Sahar, MD, 17 g at 04/18/22 2301   pravastatin (PRAVACHOL) tablet 40 mg, 40 mg, Oral, Daily, Damita Dunnings, [place]  V, MD, 40 mg at 04/17/22 0918   sodium phosphate (FLEET) 7-19 GM/118ML enema 1 enema, 1 enema, Rectal, Daily PRN, Nolberto Hanlon, MD   Physical exam:  Vitals:   04/19/22 1035 04/19/22 1050 04/19/22 1155 04/19/22 1534  BP: 140/74 (!) 127/57 140/67 127/71  Pulse: (!) 108 (!) 105 (!) 107 (!) 101  Resp: 20 20 18 18   Temp:   98.8 F (37.1 C) 98.5 F (36.9 C)  TempSrc:    Oral  SpO2: 95% 95% 93% 94%  Weight:      Height:       Physical Exam Constitutional:      General: She is not in acute distress.    Appearance: She is obese. She is not diaphoretic.  HENT:     Head: Normocephalic and atraumatic.     Nose: Nose normal.     Mouth/Throat:     Pharynx: No oropharyngeal exudate.  Eyes:     General: No scleral icterus.    Pupils: Pupils are equal, round, and reactive to light.  Cardiovascular:     Rate and Rhythm: Normal rate and regular rhythm.     Heart sounds: No murmur heard. Pulmonary:     Effort: Pulmonary effort is normal. No respiratory distress.     Breath sounds: No rales.  Chest:     Chest wall: No tenderness.  Abdominal:     General: There is no distension.     Palpations: Abdomen is soft.  Musculoskeletal:        General: Normal range of motion.     Cervical back: Normal range of motion and  neck supple.  Skin:    General: Skin is warm and dry.     Findings: No erythema.  Neurological:     Mental Status: She is alert and oriented to person, place, and time. Mental status is at baseline.     Cranial Nerves: No cranial nerve deficit.     Motor: No abnormal muscle tone.     Coordination: Coordination normal.  Psychiatric:        Mood and Affect: Affect normal.           Latest Ref Rng & Units 04/18/2022    4:30 AM  CMP  Creatinine 0.44 - 1.00 mg/dL 0.47       Latest Ref Rng & Units 04/17/2022   10:18 AM  CBC  WBC 4.0 - 10.5 K/uL 25.4   Hemoglobin 12.0 - 15.0 g/dL 14.1   Hematocrit 36.0 - 46.0 % 45.8   Platelets 150 - 400 K/uL 426     RADIOGRAPHIC STUDIES: I have personally reviewed the radiological images as listed and agreed with the findings in the report. US BIOPSY (LIVER)  Result Date: 04/19/2022 Aletta Edouard, MD     04/19/2022 10:43 AM Interventional Radiology Procedure Note Procedure: US Guided Biopsy of liver mass Complications: None Estimated Blood Loss: < 10 mL Findings: 18 G core biopsy of 7 cm left lobe liver mass performed under US guidance.  Three core samples obtained and sent to Pathology. [place]ia Night. Kathlene Cote, M.D Pager:  [phone]    CT ABDOMEN PELVIS W CONTRAST  Result Date: 04/15/2022 CLINICAL DATA:  Abdominal pain and distention EXAM: CT ABDOMEN AND PELVIS WITH CONTRAST TECHNIQUE: Multidetector CT imaging of the abdomen and pelvis was performed using the standard protocol following bolus administration of intravenous contrast. RADIATION DOSE REDUCTION: This exam was performed according to the departmental dose-optimization program which includes automated exposure control, adjustment of the  mA and/or kV according to patient size and/or use of iterative reconstruction technique. CONTRAST:  138mL OMNIPAQUE IOHEXOL 300 MG/ML  SOLN COMPARISON:  Previous studies including the CT abdomen and pelvis done on 01/05/2016 and CT chest done on 01/29/2022  FINDINGS: Lower chest: There are multiple nodules of varying sizes in the lower lung fields. There is a 3.2 cm nodule/mass in the left lower lobe. There are small patchy infiltrates in both lower lobes. Minimal bilateral pleural effusions are seen. Dense calcification is seen in mitral annulus. Hepatobiliary: There is interval appearance of multiple low-density space-occupying lesions in liver largest measuring 7.5 x 4.1 cm in the anterior aspect of the liver. There is no dilation of bile ducts. There is increased density in the dependent portion of gallbladder suggesting gallbladder stones. Pancreas: There is atrophy.  No focal abnormalities are seen. Spleen: Unremarkable. Adrenals/Urinary Tract: There is 11 mm nodule in left adrenal. There are small nodular densities in right adrenal measuring up to 11 mm. There is no hydronephrosis. Lobulations are seen in the margins of the kidneys, possibly suggesting scarring. There is 17 mm calculus in the upper pole of right kidney. There is 12 mm calculus in the midportion. There is 12 mm calculus in the lower pole. Ureters are not dilated. Urinary bladder is distended. There is air in the bladder, possibly suggesting recent instrumentation. Diverticulum is noted in the left posterior aspect of the urinary bladder. Stomach/Bowel: Small hiatal hernia is seen. Stomach is not distended. Small bowel loops not dilated. Appendix is not dilated. Scattered diverticula seen in colon without signs of focal acute diverticulitis. There is wall thickening in 5.7 cm segment of sigmoid colon in midline. Oral contrast has reached the rectum. There is large amount of stool in rectum. Transverse diameter of rectum measures 9.4 cm. There is no significant wall thickening in rectum. Vascular/Lymphatic: Scattered arterial calcifications are seen. Reproductive: There is low-density in the central portion of the uterus. There is 2.2 cm fluid density structure in the right adnexa. Other: There is  no ascites or pneumoperitoneum. Umbilical hernia containing fat is seen. Musculoskeletal: There is previous internal fixation in right femur with intramedullary rod. Degenerative changes are noted in right hip. Degenerative changes are noted in lower thoracic spine and lumbar spine. There is fracture in the upper endplate of body of L3 vertebra which may be recent or old. Decrease in height of multiple thoracic vertebral bodies may suggest recent or old compression fractures. There is inhomogeneous in attenuation in the bony structures this may be due to osteoporosis or infiltrative neoplastic process. There is a lytic lesion with adjacent soft tissue mass in the posterior aspect of left iliac crest. IMPRESSION: There is 5.7 cm mass lesion in sigmoid colon suggesting possible primary malignant neoplasm. There is no evidence of intestinal obstruction or pneumoperitoneum. There is no hydronephrosis. There are multiple nodules of varying sizes in the visualized lower lung fields consistent with pulmonary metastatic disease. Patchy infiltrates in the lower lung fields may suggest atelectasis/pneumonia. Small bilateral pleural effusions. There are multiple space-occupying lesions of varying sizes in the liver measuring up to 7.5 cm in size suggesting hepatic metastatic disease. There are few nodular densities in both adrenals suggesting possible metastatic disease. There is inhomogeneous attenuation in the bony structures suggesting possible skeletal metastatic disease. There is a lytic lesion in the posterior medial aspect of left iliac crest consistent with skeletal metastatic disease. Other findings as described in the body of the report. Electronically Signed   By:  Elmer Picker M.D.   On: 04/15/2022 20:16   CT Maxillofacial W Contrast  Result Date: 04/15/2022 CLINICAL DATA:  Facial abscess EXAM: CT MAXILLOFACIAL WITH CONTRAST TECHNIQUE: Multidetector CT imaging of the maxillofacial structures was performed  with intravenous contrast. Multiplanar CT image reconstructions were also generated. RADIATION DOSE REDUCTION: This exam was performed according to the departmental dose-optimization program which includes automated exposure control, adjustment of the mA and/or kV according to patient size and/or use of iterative reconstruction technique. CONTRAST:  115mL OMNIPAQUE IOHEXOL 300 MG/ML  SOLN COMPARISON:  None Available. FINDINGS: Osseous: The right mandibular body is expanded and hyperlucent with the abnormal area encompassing the roots of the right mandibular molars. There is multifocal cortical destruction. There is a surrounding soft tissue component Orbits: Negative. No traumatic or inflammatory finding. Sinuses: Clear. Soft tissues: Soft tissue swelling surrounding the right mandibular body. Limited intracranial: No significant or unexpected finding. 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 Clinical follow-up after resolution of symptoms is recommended to exclude a superimposed neoplastic process. Electronically Signed   By: Ulyses Jarred M.D.   On: 04/15/2022 20:04   DG Abdomen 1 View  Result Date: 04/15/2022 CLINICAL DATA:  Constipation.  Abdominal distension. EXAM: ABDOMEN - 1 VIEW COMPARISON:  04/09/22 FINDINGS: Gallstones are identified. There is been interval decrease in previously noted air-filled loops of small bowel within the left hemiabdomen. Progressive accumulation of stool is identified throughout the colon and rectum. Large volume of desiccated stool within the rectum has increased in volume compared with the previous study. Cannot rule out rectal impaction. No signs of pneumoperitoneum. Lumbar scoliosis and degenerative disc disease. Status post ORIF of the right femur. Marked degenerative disc disease is noted involving the right hip.  Moderate degenerative disc disease noted in the left hip. IMPRESSION: 1. Progressive accumulation of stool throughout the colon and rectum compatible with constipation. Cannot rule out rectal impaction. 2. Interval decrease in previously noted air-filled loops of small bowel within the left hemiabdomen. 3. Gallstones. Electronically Signed   By: Kerby Moors M.D.   On: 04/15/2022 16:13   DG Abd 1 View  Result Date: 04/09/2022 CLINICAL DATA:  Constipation. EXAM: ABDOMEN - 1 VIEW COMPARISON:  Pelvis radiograph 04/02/2022 FINDINGS: Prominent stool noted at the rectum. Mild gaseous distension above this point. No definite free air. Calcifications in the right abdomen likely gallstones. IMPRESSION: 1. Prominent stool at the rectum with mild gaseous distension above this point. 2. Cholelithiasis. Electronically Signed   By: San Morelle M.D.   On: 04/09/2022 12:27   DG Chest Portable 1 View  Result Date: 04/02/2022 CLINICAL DATA:  Recent fall with right-sided pain, initial encounter EXAM: PORTABLE CHEST 1 VIEW COMPARISON:  01/28/2022 FINDINGS: Cardiac shadow is within normal limits. Aortic calcifications are again seen. Increased vascular congestion and mild edema is noted. No focal confluent infiltrate is seen. Old rib fractures are noted bilaterally with healing. IMPRESSION: No acute abnormality noted. Electronically Signed   By: Inez Catalina M.D.   On: 04/02/2022 21:50   DG Knee 2 Views Right  Result Date: 04/02/2022 CLINICAL DATA:  Right leg pain following fall, initial encounter EXAM: RIGHT KNEE - 2 VIEW COMPARISON:  None Available. FINDINGS: Postsurgical changes in the distal femur are noted. No acute fracture is noted. IMPRESSION: No acute abnormality noted. Electronically Signed   By: Inez Catalina M.D.   On: 04/02/2022 21:49  DG Hip Unilat W or Wo Pelvis 2-3 Views Right  Result Date: 04/02/2022 CLINICAL DATA:  Right-sided hip pain following fall several hours ago, initial encounter EXAM: DG  HIP (WITH OR WITHOUT PELVIS) 2-3V RIGHT COMPARISON:  02/07/2019 FINDINGS: Pelvic ring is intact. Postsurgical changes in the proximal right femur are noted. Degenerative changes of the right hip joint are noted. No acute fracture or dislocation is seen. No soft tissue changes are noted. IMPRESSION: Degenerative change in the right hip joint. No acute abnormality is noted. Electronically Signed   By: Inez Catalina M.D.   On: 04/02/2022 21:48    Assessment and plan-   #Abnormal CT scan with sigmoid colon mass with lung, adrenal, bone metastasis, mandible lesion/osteomyelitis. Status post ultrasound-guided liver biopsy for tissue diagnosis. CEA is elevated in the 500s.  Possible metastatic colon cancer. Patient previously declined additional treatments in May 2023. She lives by herself, no close family members.  She has a friend who helps her part-time. We discussed the possibility of cancer diagnosis, her options include chemotherapy versus comfort care/hospice.  Her performance status, lack of family support will be her major barriers. Patient is currently undecided and will plan to have another discussion once biopsy result is back. She plans to discuss with her friend to see if friend could be her power of attorney and help her with decisions.  PT OT evaluation for disposition. Palliative radiation could be considered for bone pain. Palliative care service is on board Thank you for allowing me to participate in the care of this patient.   Earlie Server 04/19/2022

## 2022-04-19 NOTE — Plan of Care (Signed)
  Problem: Safety: Goal: Ability to remain free from injury will improve Outcome: Progressing   Problem: Skin Integrity: Goal: Risk for impaired skin integrity will decrease Outcome: Progressing   

## 2022-04-19 NOTE — Progress Notes (Signed)
OT Cancellation Note  Patient Details Name: April [name] MRN: [id number] DOB: 1945-11-19   Cancelled Treatment:    Reason Eval/Treat Not Completed: Patient at procedure or test/ unavailable (OT treatment attempted this morning, however Pt. was on the telephone. Will reattempt OT treatment at a later time/date.)  Harrel Carina, MS, OTR/L 04/19/2022, 12:01 PM

## 2022-04-19 NOTE — Progress Notes (Signed)
Physical Therapy Treatment Patient Details Name: April [name] MRN: [id number] DOB: [birth year] Today's Date: 04/19/2022   History of Present Illness April [name] is an 76 y.o. 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    PT Comments    Patient was agreebale to PT and wanted to try sitting upright. She continues to require assistance for bed mobility. Activity tolerance limited by generalized pain and fatigue with minimal activity. She participated in LE exercises in bed for strengthening. The recommendation for SNF remains appropriate at this time. PT will continue to follow to maximize independence and decrease caregiver burden.    Recommendations for follow up therapy are one component of a multi-disciplinary discharge planning process, led by the attending physician.  Recommendations may be updated based on patient status, additional functional criteria and insurance authorization.  Follow Up Recommendations  Skilled nursing-short term rehab (<3 hours/day) Can patient physically be transported by private vehicle: No   Assistance Recommended at Discharge Frequent or constant Supervision/Assistance  Patient can return home with the following Two people to help with walking and/or transfers;A lot of help with bathing/dressing/bathroom;Direct supervision/assist for medications management;Direct supervision/assist for financial management;Assist for transportation;Help with stairs or ramp for entrance   Equipment Recommendations   (to be determined at next level of care)    Recommendations for Other Services       Precautions / Restrictions Precautions Precautions: Shoulder;Fall Type of Shoulder Precautions: Per Previous encounter: Pt 2 months out humeral fracture. Per Dr. Sharlet Salina, allowed gentle strengthening  and shoulder mobility. Sling no longer required, no WB restrictions Restrictions Weight Bearing Restrictions: Yes RUE Weight Bearing: Weight bearing as tolerated     Mobility  Bed Mobility Overal bed mobility: Needs Assistance Bed Mobility: Supine to Sit, Sit to Supine     Supine to sit: Max assist, HOB elevated Sit to supine: Max assist, HOB elevated   General bed mobility comments: maximal assistance for LE and trunk support to achieve partial sitting position. activity tolerance limited by discomfort and patient requesting to return to bed. she does appear to be fatigued with minimal activity    Transfers                        Ambulation/Gait                   Stairs             Wheelchair Mobility    Modified Rankin (Stroke Patients Only)       Balance                                            Cognition Arousal/Alertness: Awake/alert Behavior During Therapy: WFL for tasks assessed/performed Overall Cognitive Status: No family/caregiver present to determine baseline cognitive functioning                                 General Comments: patient with confusion and anxiety today, yelling out into hallway earlier. during this treament she is cooperative and following single step commands with increased time.        Exercises General Exercises -  Lower Extremity Heel Slides: AAROM, Strengthening, Both, 10 reps, Supine Hip ABduction/ADduction: AAROM, Strengthening, Both, 10 reps, Supine Straight Leg Raises: AAROM, Strengthening, Both, 10 reps, Supine Other Exercises Other Exercises: verbal and visual cues for exercise technique    General Comments        Pertinent Vitals/Pain Pain Assessment Pain Assessment: Faces Faces Pain Scale: Hurts even more Pain Location: generalized pain Pain Descriptors / Indicators: Discomfort Pain Intervention(s): Limited activity within patient's tolerance    Home  Living                          Prior Function            PT Goals (current goals can now be found in the care plan section) Acute Rehab PT Goals Patient Stated Goal: to sit up PT Goal Formulation: With patient Time For Goal Achievement: 05/01/22 Potential to Achieve Goals: Fair Progress towards PT goals: Progressing toward goals    Frequency    Min 2X/week      PT Plan Current plan remains appropriate    Co-evaluation              AM-PAC PT "6 Clicks" Mobility   Outcome Measure  Help needed turning from your back to your side while in a flat bed without using bedrails?: A Lot Help needed moving from lying on your back to sitting on the side of a flat bed without using bedrails?: A Lot Help needed moving to and from a bed to a chair (including a wheelchair)?: Total Help needed standing up from a chair using your arms (e.g., wheelchair or bedside chair)?: Total Help needed to walk in hospital room?: Total Help needed climbing 3-5 steps with a railing? : Total 6 Click Score: 8    End of Session Equipment Utilized During Treatment: Oxygen Activity Tolerance: Patient limited by pain;Patient limited by fatigue Patient left: in bed;with call bell/phone within reach;with bed alarm set Nurse Communication: Mobility status PT Visit Diagnosis: History of falling (Z91.81);Muscle weakness (generalized) (M62.81);Difficulty in walking, not elsewhere classified (R26.2);Pain     Time: 7517-0017 PT Time Calculation (min) (ACUTE ONLY): 18 min  Charges:  $Therapeutic Activity: 8-22 mins                     April [name], PT, MPT    April [name] 04/19/2022, 3:18 PM

## 2022-04-20 ENCOUNTER — Inpatient Hospital Stay: Payer: Medicare HMO

## 2022-04-20 DIAGNOSIS — C189 Malignant neoplasm of colon, unspecified: Secondary | ICD-10-CM | POA: Diagnosis not present

## 2022-04-20 DIAGNOSIS — K5900 Constipation, unspecified: Secondary | ICD-10-CM | POA: Diagnosis not present

## 2022-04-20 DIAGNOSIS — M272 Inflammatory conditions of jaws: Secondary | ICD-10-CM | POA: Diagnosis not present

## 2022-04-20 DIAGNOSIS — J9611 Chronic respiratory failure with hypoxia: Secondary | ICD-10-CM | POA: Diagnosis not present

## 2022-04-20 DIAGNOSIS — G8929 Other chronic pain: Secondary | ICD-10-CM | POA: Diagnosis not present

## 2022-04-20 LAB — BASIC METABOLIC PANEL
Anion gap: 10 (ref 5–15)
BUN: 9 mg/dL (ref 8–23)
CO2: 31 mmol/L (ref 22–32)
Calcium: 8.6 mg/dL — ABNORMAL LOW (ref 8.9–10.3)
Chloride: 97 mmol/L — ABNORMAL LOW (ref 98–111)
Creatinine, Ser: 0.38 mg/dL — ABNORMAL LOW (ref 0.44–1.00)
GFR, Estimated: >60 mL/min (ref >60)
Glucose, Bld: 121 mg/dL — ABNORMAL HIGH (ref 70–99)
Potassium: 3.1 mmol/L — ABNORMAL LOW (ref 3.5–5.1)
Sodium: 138 mmol/L (ref 135–145)

## 2022-04-20 LAB — GLUCOSE, CAPILLARY
Glucose-Capillary: 116 mg/dL — ABNORMAL HIGH (ref 70–99)
Glucose-Capillary: 117 mg/dL — ABNORMAL HIGH (ref 70–99)
Glucose-Capillary: 138 mg/dL — ABNORMAL HIGH (ref 70–99)
Glucose-Capillary: 160 mg/dL — ABNORMAL HIGH (ref 70–99)
Glucose-Capillary: 161 mg/dL — ABNORMAL HIGH (ref 70–99)

## 2022-04-20 NOTE — TOC Progression Note (Signed)
Transition of Care [facility]) - Progression Note    Patient Details  Name: April [name] MRN: [id number] Date of Birth: Nov 09, 1945  Transition of Care [facility]) CM/SW [place], RN Phone Number: 04/20/2022, 11:33 AM  Clinical Narrative:     I spoke to the patient and her friend in the room, I explained that at this time there are no bed offers, she agreed to expand the bedsearch throughout the Hub, Yell expanded.       Expected Discharge Plan and Services           Expected Discharge Date: 04/17/22                                     Social Determinants of Health (SDOH) Interventions    Readmission Risk Interventions    04/04/2022    1:53 PM  Readmission Risk Prevention Plan  Post Dischage Appt Complete  Medication Screening Complete  Transportation Screening Complete

## 2022-04-20 NOTE — Consult Note (Addendum)
NAME: April [name]  DOB: 02-21-1946  MRN: [id number]  Date/Time: 04/20/2022 12:23 PM : Dr.Amery Subjective:  REASON FOR CONSULT: leucocytosis and mandible osteo ? April [name] is a 76 y.o. with a history of diabetes mellitus, paroxysmal A. fib  on Coumadin, history of bilateral lower extremity gangrene in 2017, Followed by [facility] June 2017, Lung mass noted in May 2023 when she hospitalized for rt humerus fracture but refused work up .She was in the hospital in early 7/8-7/16 for frequent falls July after a mechanical fall Now presenting with abdominal pain and constipation for 2-3 weeks   Pt lives on her own She has had pain abdomen for a few days- , lower abdomen, associated with constipation for 2 weeks She also noted swelling of her rt jaw for 6 months now On presentation to the ED on 04/15/22 Temp 98.1, BP 133/71, HR 107 RR 31 WBC 27.6, HB 14.4, PLT 440, cr 0.41 CT abdomen and pelvis shows sigmoid colon mass and liver mass  CT mandible shows expanded right mandibular body with surrounding soft tissue and multifocal cortical destruction indicating active osteomyelitis with an intraosseous necrotic/purulent collection Seen by ENT- malignancy  suspected than infection She was placed on IV unasyn Liver biopsy done on 04/19/22 I am asked to see the patient for persistent leucocytosis Past Medical History:  Diagnosis Date   Acute renal failure (ARF) ([place])    Difficult intubation    Hypertension    NSTEMI (non-ST elevated myocardial infarction) ([place])    Paroxysmal atrial fibrillation ([place])    Renal disorder    Sepsis [facility]) January 2017   [facility]   Type 2 diabetes mellitus [facility])     Past Surgical History:  Procedure Laterality Date   broken hip  2019   CYSTOSCOPY WITH STENT PLACEMENT Right 11/03/2015   Procedure: CYSTOSCOPY WITH STENT PLACEMENT;  Surgeon: Hollice Espy, MD;  Location: [facility] ORS;  Service: Urology;  Laterality: Right;   FRACTURE SURGERY Right    Elbow   INTRAMEDULLARY (IM)  NAIL INTERTROCHANTERIC Right 02/07/2019   Procedure: INTRAMEDULLARY (IM) NAIL INTERTROCHANTRIC;  Surgeon: Thornton Park, MD;  Location: [facility] ORS;  Service: Orthopedics;  Laterality: Right;   TONSILLECTOMY     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   Procedure: TRANSMETATARSAL AMPUTATION;  Surgeon: Algernon Huxley, MD;  Location: [facility] ORS;  Service: Vascular;  Laterality: Bilateral;    Social History   Socioeconomic History   Marital status: Single    Spouse name: Not on file   Number of children: Not on file   Years of education: Not on file   Highest education level: Not on file  Occupational History   Not on file  Tobacco Use   Smoking status: Never   Smokeless tobacco: Never  Substance and Sexual Activity   Alcohol use: No    Alcohol/week: 0.0 standard drinks of alcohol   Drug use: No   Sexual activity: Not on file  Other Topics Concern   Not on file  Social History Narrative   Not on file   Social Determinants of Health   Financial Resource Strain: Not on file  Food Insecurity: Not on file  Transportation Needs: Not on file  Physical Activity: Not on file  Stress: Not on file  Social Connections: Not on file  Intimate Partner Violence: Not on file    Family History  Problem Relation Age of Onset   Diabetes Father    Heart disease Father    Stroke Maternal Grandmother  Allergies  Allergen Reactions   Prednisone Anaphylaxis   Other    I? Current Facility-Administered Medications  Medication Dose Route Frequency Provider Last Rate Last Admin   acetaminophen (TYLENOL) tablet 650 mg  650 mg Oral Q6H PRN Athena Masse, MD   650 mg at 04/18/22 2038   Or   acetaminophen (TYLENOL) suppository 650 mg  650 mg Rectal Q6H PRN Athena Masse, MD       amLODipine (NORVASC) tablet 5 mg  5 mg Oral Daily Nolberto Hanlon, MD   5 mg at 04/20/22 0938   Ampicillin-Sulbactam (UNASYN) 3 g in sodium chloride 0.9 % 100 mL IVPB  3 g Intravenous Q6H Chinita Greenland A, RPH 200  mL/hr at 04/20/22 1041 3 g at 04/20/22 1041   bisacodyl (DULCOLAX) suppository 10 mg  10 mg Rectal Once Nolberto Hanlon, MD       chlorhexidine (PERIDEX) 0.12 % solution 10 mL  10 mL Mouth/Throat QID Beverly Gust, MD   10 mL at 04/19/22 2257   docusate sodium (COLACE) capsule 100 mg  100 mg Oral BID Athena Masse, MD   100 mg at 04/20/22 4270   empagliflozin (JARDIANCE) tablet 10 mg  10 mg Oral Daily Judd Gaudier V, MD   10 mg at 04/20/22 0938   enoxaparin (LOVENOX) injection 45 mg  0.5 mg/kg Subcutaneous Q24H Judd Gaudier V, MD   45 mg at 04/19/22 2258   gabapentin (NEURONTIN) capsule 300 mg  300 mg Oral TID Athena Masse, MD   300 mg at 04/20/22 6237   insulin aspart (novoLOG) injection 0-15 Units  0-15 Units Subcutaneous TID WC Athena Masse, MD   2 Units at 04/19/22 1208   insulin aspart (novoLOG) injection 0-5 Units  0-5 Units Subcutaneous QHS Judd Gaudier V, MD       metFORMIN (GLUCOPHAGE) tablet 500 mg  500 mg Oral BID Judd Gaudier V, MD   500 mg at 04/20/22 0845   metoprolol tartrate (LOPRESSOR) tablet 25 mg  25 mg Oral BID Athena Masse, MD   25 mg at 04/20/22 6283   morphine (PF) 2 MG/ML injection 2 mg  2 mg Intravenous Q2H PRN Athena Masse, MD   2 mg at 04/20/22 0530   ondansetron (ZOFRAN) tablet 4 mg  4 mg Oral Q6H PRN Athena Masse, MD       Or   ondansetron [facility]) injection 4 mg  4 mg Intravenous Q6H PRN Athena Masse, MD   4 mg at 04/16/22 0057   oxyCODONE (Oxy IR/ROXICODONE) immediate release tablet 5 mg  5 mg Oral Q6H PRN Nolberto Hanlon, MD   5 mg at 04/20/22 0937   polyethylene glycol (MIRALAX / GLYCOLAX) packet 17 g  17 g Oral BID Nolberto Hanlon, MD   17 g at 04/20/22 1517   pravastatin (PRAVACHOL) tablet 40 mg  40 mg Oral Daily Athena Masse, MD   40 mg at 04/20/22 6160   sodium phosphate (FLEET) 7-19 GM/118ML enema 1 enema  1 enema Rectal Daily PRN Nolberto Hanlon, MD         Abtx:  Anti-infectives (From admission, onward)    Start     Dose/Rate Route  Frequency Ordered Stop   04/17/22 0800  vancomycin (VANCOREADY) IVPB 1250 mg/250 mL  Status:  Discontinued        1,250 mg 166.7 mL/hr over 90 Minutes Intravenous Every 24 hours 04/16/22 1522 04/18/22 1645   04/16/22 1615  vancomycin (VANCOREADY)  IVPB 1500 mg/300 mL        1,500 mg 150 mL/hr over 120 Minutes Intravenous  Once 04/16/22 1522 04/16/22 1847   04/16/22 0400  Ampicillin-Sulbactam (UNASYN) 3 g in sodium chloride 0.9 % 100 mL IVPB        3 g 200 mL/hr over 30 Minutes Intravenous Every 6 hours 04/15/22 2234     04/15/22 2100  Ampicillin-Sulbactam (UNASYN) 3 g in sodium chloride 0.9 % 100 mL IVPB        3 g 200 mL/hr over 30 Minutes Intravenous  Once 04/15/22 2054 04/15/22 2212       REVIEW OF SYSTEMS:  Const: negative fever, negative chills, negative weight loss Eyes: negative diplopia or visual changes, negative eye pain ENT: negative coryza, negative sore throat Resp: negative cough, hemoptysis, dyspnea Cards: negative for chest pain, palpitations, lower extremity edema GU: negative for frequency, dysuria and hematuria GI: as above Skin: negative for rash and pruritus Heme: negative for easy bruising and gum/nose bleeding MS: weakness Neurolo:falls Psych: negative for feelings of anxiety, depression  Endocrine: negative for thyroid, diabetes Allergy/Immunology- as above Objective:  VITALS:  BP (!) 122/56 (BP Location: Left Arm)   Pulse 93   Temp 98.4 F (36.9 C)   Resp 18   Ht 5\' 6"  (1.676 m)   Wt 91.4 kg   SpO2 93%   BMI 32.52 kg/m   PHYSICAL EXAM:  General: Alert, cooperative, no distress, appears stated age.  Head: Normocephalic, without obvious abnormality, atraumatic. Eyes: Conjunctivae clear, anicteric sclerae. Pupils are equal Oral cavity- fungating mass rt side of the mouth inside Pushing the teeth inside     Neck: Supple, symmetrical, no adenopathy, thyroid: non tender no carotid bruit and no JVD. Lungs:b/l air entry. Heart: s1s2 Abdomen:  Soft, non-tender,not distended. Bowel sounds normal. No masses Extremities: atraumatic, no cyanosis. No edema. No clubbing Skin: No rashes or lesions. Or bruising Lymph: Cervical, supraclavicular normal. Neurologic: Grossly non-focal Pertinent Labs Lab Results CBC    Component Value Date/Time   WBC 25.4 (H) 04/17/2022 1018   RBC 5.16 (H) 04/17/2022 1018   HGB 14.1 04/17/2022 1018   HCT 45.8 04/17/2022 1018   PLT 426 (H) 04/17/2022 1018   MCV 88.8 04/17/2022 1018   MCH 27.3 04/17/2022 1018   MCHC 30.8 04/17/2022 1018   RDW 16.6 (H) 04/17/2022 1018   LYMPHSABS 0.8 04/06/2022 0819   MONOABS 1.2 (H) 04/06/2022 0819   EOSABS 0.5 04/06/2022 0819   BASOSABS 0.1 04/06/2022 0819       Latest Ref Rng & Units 04/18/2022    4:30 AM 04/17/2022    4:51 AM 04/16/2022    4:02 AM  CMP  Glucose 70 - 99 mg/dL   102   BUN 8 - 23 mg/dL   13   Creatinine 0.44 - 1.00 mg/dL 0.47  0.49  0.50   Sodium 135 - 145 mmol/L   136   Potassium 3.5 - 5.1 mmol/L   4.3   Chloride 98 - 111 mmol/L   97   CO2 22 - 32 mmol/L   27   Calcium 8.9 - 10.3 mg/dL   9.2       Microbiology: Recent Results (from the past 240 hour(s))  Blood culture (routine x 2)     Status: Abnormal   Collection Time: 04/15/22  7:11 PM   Specimen: BLOOD LEFT WRIST  Result Value Ref Range Status   Specimen Description   Final    BLOOD LEFT  WRIST Performed at [facility], [address], [place], [place] [address]    Special Requests   Final    BOTTLES DRAWN AEROBIC AND ANAEROBIC Blood Culture adequate volume Performed at [facility], [address] [place], [place] [address]    Culture  Setup Time   Final    GRAM POSITIVE COCCI ANAEROBIC BOTTLE ONLY Organism ID to follow CRITICAL RESULT CALLED TO, READ BACK BY AND VERIFIED WITH: RODNEY GRUBB 04/16/22 1451 DE    Culture (A)  Final    STAPHYLOCOCCUS EPIDERMIDIS THE SIGNIFICANCE OF ISOLATING THIS ORGANISM FROM A SINGLE SET OF BLOOD CULTURES WHEN  MULTIPLE SETS ARE DRAWN IS UNCERTAIN. PLEASE NOTIFY THE MICROBIOLOGY DEPARTMENT WITHIN ONE WEEK IF SPECIATION AND SENSITIVITIES ARE REQUIRED. Performed at [facility], [place] [address] [place], [place] [address]    Report Status 04/18/2022 FINAL  Final  Blood Culture ID Panel (Reflexed)     Status: Abnormal   Collection Time: 04/15/22  7:11 PM  Result Value Ref Range Status   Enterococcus faecalis NOT DETECTED NOT DETECTED Final   Enterococcus Faecium NOT DETECTED NOT DETECTED Final   Listeria monocytogenes NOT DETECTED NOT DETECTED Final   Staphylococcus species DETECTED (A) NOT DETECTED Final    Comment: CRITICAL RESULT CALLED TO, READ BACK BY AND VERIFIED WITH: RODNEY GRUBB 04/16/22 1451 DE    Staphylococcus aureus (BCID) NOT DETECTED NOT DETECTED Final   Staphylococcus epidermidis DETECTED (A) NOT DETECTED Final    Comment: Methicillin (oxacillin) resistant coagulase negative staphylococcus. Possible blood culture contaminant (unless isolated from more than one blood culture draw or clinical case suggests pathogenicity). No antibiotic treatment is indicated for blood  culture contaminants. CRITICAL RESULT CALLED TO, READ BACK BY AND VERIFIED WITH: RODNEY GRUBB 04/16/22 1451 DE    Staphylococcus lugdunensis NOT DETECTED NOT DETECTED Final   Streptococcus species NOT DETECTED NOT DETECTED Final   Streptococcus agalactiae NOT DETECTED NOT DETECTED Final   Streptococcus pneumoniae NOT DETECTED NOT DETECTED Final   Streptococcus pyogenes NOT DETECTED NOT DETECTED Final   A.calcoaceticus-baumannii NOT DETECTED NOT DETECTED Final   Bacteroides fragilis NOT DETECTED NOT DETECTED Final   Enterobacterales NOT DETECTED NOT DETECTED Final   Enterobacter cloacae complex NOT DETECTED NOT DETECTED Final   Escherichia coli NOT DETECTED NOT DETECTED Final   Klebsiella aerogenes NOT DETECTED NOT DETECTED Final   Klebsiella oxytoca NOT DETECTED NOT DETECTED Final   Klebsiella pneumoniae NOT  DETECTED NOT DETECTED Final   Proteus species NOT DETECTED NOT DETECTED Final   Salmonella species NOT DETECTED NOT DETECTED Final   Serratia marcescens NOT DETECTED NOT DETECTED Final   Haemophilus influenzae NOT DETECTED NOT DETECTED Final   Neisseria meningitidis NOT DETECTED NOT DETECTED Final   Pseudomonas aeruginosa NOT DETECTED NOT DETECTED Final   Stenotrophomonas maltophilia NOT DETECTED NOT DETECTED Final   Candida albicans NOT DETECTED NOT DETECTED Final   Candida auris NOT DETECTED NOT DETECTED Final   Candida glabrata NOT DETECTED NOT DETECTED Final   Candida krusei NOT DETECTED NOT DETECTED Final   Candida parapsilosis NOT DETECTED NOT DETECTED Final   Candida tropicalis NOT DETECTED NOT DETECTED Final   Cryptococcus neoformans/gattii NOT DETECTED NOT DETECTED Final   Methicillin resistance mecA/C DETECTED (A) NOT DETECTED Final    Comment: CRITICAL RESULT CALLED TO, READ BACK BY AND VERIFIED WITH: RODNEY GRUBB 04/16/22 1451 Performed at [facility], [address] [place], [place] [address]   Culture, blood (Routine X 2) w Reflex to  ID Panel     Status: None (Preliminary result)   Collection Time: 04/17/22  4:51 AM   Specimen: BLOOD  Result Value Ref Range Status   Specimen Description BLOOD LEFT ANTECUBITAL  Final   Special Requests   Final    BOTTLES DRAWN AEROBIC AND ANAEROBIC Blood Culture results may not be optimal due to an inadequate volume of blood received in culture bottles   Culture   Final    NO GROWTH 3 DAYS Performed at [facility], [address] [place], [place] [address]    Report Status PENDING  Incomplete    IMAGING RESULTS: Liver mets  I have personally reviewed the films ? Lung mets CT maxillofacial  Expanded right mandibular body with surrounding soft tissue and multifocal cortical destruction  Impression/Recommendation Rt jaw mass-?fungating bony mass - this is malignancy unles sproven otherwise May have  secondary infection On unasyn Dont think antibiotics would help much  Leucocytosis - present since 2017- R/o CLL  Sigmoid colon mass, liver mets , lung mets and bone mets Poor prognosis Oncology on board Liver biopsy done  HTN- management as per primary team   Chronic resp failure with hypoxia on oxygen  Staph epidermidis in 1 blood culture is a contaminant  ? Discussed the management with patient,  Note:  This document was prepared using Dragon voice recognition software and may include unintentional dictation errors.

## 2022-04-20 NOTE — Progress Notes (Signed)
PROGRESS NOTE    April [name]  AES:[id number] DOB: [birth year] DOA: 04/15/2022 PCP: Albina Billet, MD    Brief Narrative:  April [name] is a 76 y.o. female with medical history significant for Type II DM, HTN, PVD, recently hospitalized from 7/8 to 7/16 for frequent falls and discharged to SNF, who at that time had chronic leukocytosis of unknown etiology but found to have Klebsiella UTI for which she was treated and who at the same time had a known lung mass seen on CT 01/30/2022 for which she declined treatment or further investigation, evaluated by palliative care prior to discharge, who now returns to the ED with a complaint of abdominal pain, constipation, and pain all over, including pain on her right jaw and ' something going on with her tooth'.  She states her last bowel movement was 2 to 3 weeks prior and she has pain with urination.  She denies fever or chills or cough or shortness of breath. ED course and data review: In the ED intermittently tachycardic to 101.  O2 sat intermittently 90-94 for which she was placed on nasal cannula 2 to 3 L.  Vitals otherwise unremarkable Labs notable for WBC of 27,000 with lactic acid 1.3, up from an high baseline of 19-21,000 during her hospitalization a couple weeks prior.  Platelets 440 and alk phos 561.  The emergency provider spoke with ENT specialist, Dr. Tami Ribas who advised that infection on mandible is possibly related to underlying malignancy so any surgical intervention would likely worsen outcome and thus recommended antibiotic therapy only.  Recommended oral Augmentin, according to discussion with ED provider  7/22 no overnight issues. Oncology was consulted 7/23 plan for ultrasound-guided liver biopsy tomorrow. PT recommends SNF 7/25 s/p liver bx today 7/26 Tmax 100.  WBC up  Consultants:  Oncology , ENT  Procedures:  CT a/p There is 5.7 cm mass lesion in sigmoid colon suggesting possible primary malignant neoplasm.  There is no  evidence of intestinal obstruction or pneumoperitoneum. There is no hydronephrosis.  There are multiple nodules of varying sizes in the visualized lower lung fields consistent with pulmonary metastatic disease. Patchy infiltrates in the lower lung fields may suggest atelectasis/pneumonia. Small bilateral pleural effusions.  There are multiple space-occupying lesions of varying sizes in the liver measuring up to 7.5 cm in size suggesting hepatic metastatic disease.  There are few nodular densities in both adrenals suggesting possible metastatic disease.  There is inhomogeneous attenuation in the bony structures suggesting possible skeletal metastatic disease. There is a lytic lesion in the posterior medial aspect of left iliac crest consistent with skeletal metastatic disease.  Other findings as described in the body of the report.  CT max Expanded right mandibular body with surrounding soft tissue and multifocal cortical destruction, likely indicating active osteomyelitis with an intra osseous necrotic or purulence collection. Clinical follow-up after resolution of symptoms is recommended to exclude a superimposed neoplastic process.     Antimicrobials:  Unasyn   Subjective: She has no complaints this AM.  Denies worsening shortness of breath, chills, chest pain or any other symptoms  Objective: Vitals:   04/19/22 1935 04/19/22 2308 04/20/22 0453 04/20/22 0742  BP: 127/61 (!) 142/70 130/65 136/72  Pulse: 100 98 (!) 102 (!) 106  Resp: '20 20 20 18  ' Temp: 100 F (37.8 C) 98.9 F (37.2 C) 98 F (36.7 C) 99.1 F (37.3 C)  TempSrc: Oral   Oral  SpO2: 95% 95% 93% 94%  Weight:  Height:        Intake/Output Summary (Last 24 hours) at 04/20/2022 0803 Last data filed at 04/19/2022 1600 Gross per 24 hour  Intake --  Output 200 ml  Net -200 ml   Filed Weights   04/15/22 1441  Weight: 91.4 kg    Examination: Calm, NAD Rt mandible swelling mildly down Poor  respiratory effort anteriorly, hard to turn. Decrease bs Reg s1/s2 no gallop Soft benign +bs No edema Awake and alert Mood and affect appropriate in current setting     Data Reviewed: I have personally reviewed following labs and imaging studies  CBC: Recent Labs  Lab 04/15/22 1442 04/16/22 0402 04/17/22 1018  WBC 27.6* 28.4* 25.4*  HGB 14.4 14.0 14.1  HCT 46.2* 45.0 45.8  MCV 85.4 87.2 88.8  PLT 440* 457* 147*   Basic Metabolic Panel: Recent Labs  Lab 04/15/22 1442 04/16/22 0402 04/17/22 0451 04/18/22 0430  NA 132* 136  --   --   K 3.5 4.3  --   --   CL 96* 97*  --   --   CO2 27 27  --   --   GLUCOSE 98 102*  --   --   BUN 14 13  --   --   CREATININE 0.41* 0.50 0.49 0.47  CALCIUM 9.0 9.2  --   --    GFR: Estimated Creatinine Clearance: 69.2 mL/min (by C-G formula based on SCr of 0.47 mg/dL). Liver Function Tests: Recent Labs  Lab 04/15/22 1442  AST 48*  ALT 15  ALKPHOS 561*  BILITOT 1.2  PROT 6.9  ALBUMIN 2.4*   Recent Labs  Lab 04/15/22 1442  LIPASE 24   No results for input(s): "AMMONIA" in the last 168 hours. Coagulation Profile: Recent Labs  Lab 04/18/22 0430  INR 1.2   Cardiac Enzymes: No results for input(s): "CKTOTAL", "CKMB", "CKMBINDEX", "TROPONINI" in the last 168 hours. BNP (last 3 results) No results for input(s): "PROBNP" in the last 8760 hours. HbA1C: No results for input(s): "HGBA1C" in the last 72 hours. CBG: Recent Labs  Lab 04/19/22 1648 04/19/22 2041 04/19/22 2237 04/20/22 0000 04/20/22 0738  GLUCAP 109* 218* 123* 138* 117*   Lipid Profile: No results for input(s): "CHOL", "HDL", "LDLCALC", "TRIG", "CHOLHDL", "LDLDIRECT" in the last 72 hours. Thyroid Function Tests: No results for input(s): "TSH", "T4TOTAL", "FREET4", "T3FREE", "THYROIDAB" in the last 72 hours. Anemia Panel: No results for input(s): "VITAMINB12", "FOLATE", "FERRITIN", "TIBC", "IRON", "RETICCTPCT" in the last 72 hours. Sepsis Labs: Recent Labs   Lab 04/15/22 1911 04/17/22 0451  LATICACIDVEN 1.3 1.7    Recent Results (from the past 240 hour(s))  Blood culture (routine x 2)     Status: Abnormal   Collection Time: 04/15/22  7:11 PM   Specimen: BLOOD LEFT WRIST  Result Value Ref Range Status   Specimen Description   Final    BLOOD LEFT WRIST Performed at [facility], [address] [place], [place] [address]    Special Requests   Final    BOTTLES DRAWN AEROBIC AND ANAEROBIC Blood Culture adequate volume Performed at [facility], [address] [place], [place] [address]    Culture  Setup Time   Final    GRAM POSITIVE COCCI ANAEROBIC BOTTLE ONLY Organism ID to follow CRITICAL RESULT CALLED TO, READ BACK BY AND VERIFIED WITH: RODNEY GRUBB 04/16/22 1451 DE    Culture (A)  Final    STAPHYLOCOCCUS EPIDERMIDIS THE SIGNIFICANCE OF ISOLATING THIS ORGANISM FROM  A SINGLE SET OF BLOOD CULTURES WHEN MULTIPLE SETS ARE DRAWN IS UNCERTAIN. PLEASE NOTIFY THE MICROBIOLOGY DEPARTMENT WITHIN ONE WEEK IF SPECIATION AND SENSITIVITIES ARE REQUIRED. Performed at [facility], [place] [address] [place], [place] [address]    Report Status 04/18/2022 FINAL  Final  Blood Culture ID Panel (Reflexed)     Status: Abnormal   Collection Time: 04/15/22  7:11 PM  Result Value Ref Range Status   Enterococcus faecalis NOT DETECTED NOT DETECTED Final   Enterococcus Faecium NOT DETECTED NOT DETECTED Final   Listeria monocytogenes NOT DETECTED NOT DETECTED Final   Staphylococcus species DETECTED (A) NOT DETECTED Final    Comment: CRITICAL RESULT CALLED TO, READ BACK BY AND VERIFIED WITH: RODNEY GRUBB 04/16/22 1451 DE    Staphylococcus aureus (BCID) NOT DETECTED NOT DETECTED Final   Staphylococcus epidermidis DETECTED (A) NOT DETECTED Final    Comment: Methicillin (oxacillin) resistant coagulase negative staphylococcus. Possible blood culture contaminant (unless isolated from more than one blood culture draw or clinical case  suggests pathogenicity). No antibiotic treatment is indicated for blood  culture contaminants. CRITICAL RESULT CALLED TO, READ BACK BY AND VERIFIED WITH: RODNEY GRUBB 04/16/22 1451 DE    Staphylococcus lugdunensis NOT DETECTED NOT DETECTED Final   Streptococcus species NOT DETECTED NOT DETECTED Final   Streptococcus agalactiae NOT DETECTED NOT DETECTED Final   Streptococcus pneumoniae NOT DETECTED NOT DETECTED Final   Streptococcus pyogenes NOT DETECTED NOT DETECTED Final   A.calcoaceticus-baumannii NOT DETECTED NOT DETECTED Final   Bacteroides fragilis NOT DETECTED NOT DETECTED Final   Enterobacterales NOT DETECTED NOT DETECTED Final   Enterobacter cloacae complex NOT DETECTED NOT DETECTED Final   Escherichia coli NOT DETECTED NOT DETECTED Final   Klebsiella aerogenes NOT DETECTED NOT DETECTED Final   Klebsiella oxytoca NOT DETECTED NOT DETECTED Final   Klebsiella pneumoniae NOT DETECTED NOT DETECTED Final   Proteus species NOT DETECTED NOT DETECTED Final   Salmonella species NOT DETECTED NOT DETECTED Final   Serratia marcescens NOT DETECTED NOT DETECTED Final   Haemophilus influenzae NOT DETECTED NOT DETECTED Final   Neisseria meningitidis NOT DETECTED NOT DETECTED Final   Pseudomonas aeruginosa NOT DETECTED NOT DETECTED Final   Stenotrophomonas maltophilia NOT DETECTED NOT DETECTED Final   Candida albicans NOT DETECTED NOT DETECTED Final   Candida auris NOT DETECTED NOT DETECTED Final   Candida glabrata NOT DETECTED NOT DETECTED Final   Candida krusei NOT DETECTED NOT DETECTED Final   Candida parapsilosis NOT DETECTED NOT DETECTED Final   Candida tropicalis NOT DETECTED NOT DETECTED Final   Cryptococcus neoformans/gattii NOT DETECTED NOT DETECTED Final   Methicillin resistance mecA/C DETECTED (A) NOT DETECTED Final    Comment: CRITICAL RESULT CALLED TO, READ BACK BY AND VERIFIED WITH: RODNEY GRUBB 04/16/22 1451 Performed at [facility], [address]  [place],  [address]   Culture, blood (Routine X 2) w Reflex to ID Panel     Status: None (Preliminary result)   Collection Time: 04/17/22  4:51 AM   Specimen: BLOOD  Result Value Ref Range Status   Specimen Description BLOOD LEFT ANTECUBITAL  Final   Special Requests   Final    BOTTLES DRAWN AEROBIC AND ANAEROBIC Blood Culture results may not be optimal due to an inadequate volume of blood received in culture bottles   Culture   Final    NO GROWTH 2 DAYS Performed at [facility], [address], [place],  [address]    Report Status PENDING  Incomplete         Radiology Studies: US BIOPSY (LIVER)  Result Date: 04/19/2022 INDICATION: Hepatic masses including large left lobe hepatic mass, multiple pulmonary masses and sigmoid colonic mass. The patient presents for liver biopsy. EXAM: ULTRASOUND GUIDED CORE BIOPSY OF LIVER MEDICATIONS: None. ANESTHESIA/SEDATION: Moderate (conscious) sedation was employed during this procedure. A total of Fentanyl 50 mcg was administered intravenously by radiology nursing. Moderate Sedation Time: 15 minutes. The patient's level of consciousness and vital signs were monitored continuously by radiology nursing throughout the procedure under my direct supervision. PROCEDURE: The procedure, risks, benefits, and alternatives were explained to the patient. Questions regarding the procedure were encouraged and answered. The patient understands and consents to the procedure. A time-out was performed prior to initiating the procedure. The abdominal wall was prepped with chlorhexidine in a sterile fashion, and a sterile drape was applied covering the operative field. A sterile gown and sterile gloves were used for the procedure. Local anesthesia was provided with 1% Lidocaine. After localizing a left lobe liver mass by ultrasound, a 17 gauge trocar needle was advanced into the lesion under direct ultrasound guidance. Three separate coaxial 18 gauge core  biopsy samples were obtained and submitted in formalin. Gel-Foam pledgets were advanced through the outer needle as it was retracted and removed. Additional ultrasound was performed. COMPLICATIONS: None immediate. FINDINGS: Large hyperechoic oval-shaped mass in the left lobe of the liver measures up to 7 cm in greatest diameter by ultrasound. Solid tissue was obtained from the lesion. IMPRESSION: Ultrasound-guided core biopsy performed of a large mass in the left lobe of the liver measuring up to approximately 7 cm in greatest diameter. Electronically Signed   By: Aletta Edouard M.D.   On: 04/19/2022 11:39        Scheduled Meds:  amLODipine  5 mg Oral Daily   bisacodyl  10 mg Rectal Once   chlorhexidine  10 mL Mouth/Throat QID   docusate sodium  100 mg Oral BID   empagliflozin  10 mg Oral Daily   enoxaparin (LOVENOX) injection  0.5 mg/kg Subcutaneous Q24H   gabapentin  300 mg Oral TID   insulin aspart  0-15 Units Subcutaneous TID WC   insulin aspart  0-5 Units Subcutaneous QHS   metFORMIN  500 mg Oral BID   metoprolol tartrate  25 mg Oral BID   polyethylene glycol  17 g Oral BID   pravastatin  40 mg Oral Daily   Continuous Infusions:  ampicillin-sulbactam (UNASYN) IV 3 g (04/20/22 0530)    Assessment & Plan:   Principal Problem:   Constipation Active Problems:   Colon cancer metastasized to liver, lung and bone (HCC)   Acute osteomyelitis of mandible   Type 2 diabetes mellitus (HCC)   PAD (peripheral artery disease) (HCC)   Essential hypertension   Chronic pain   Frailty syndrome in geriatric patient   Chronic respiratory failure with hypoxia ([place])   Osteomyelitis of mandible   Goals of care, counseling/discussion   Colonic mass   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 [facility])   Palliative care encounter   Multiple lesions of metastatic malignancy ([place])   Colon cancer metastasized to liver, lung and bone (HCC) Constipation,  multifactorial Patient presents with constipation and abdominal pain, possibly related to sigmoid cancer seen on CT though without evidence of obstruction Patient is on chronic narcotics so this could also be the etiology of her constipation Stool softeners, laxatives and enema if needed Patient desires no intervention  or further diagnostic evaluation Was seen by palliative care on 7/16 prior to her recent discharge 7/23 no bm yet. continue bowel regimen .  Bisocodyl suppository x1 7/24 oncology consulted input appreciated.  Recommend ultrasound-guided liver biopsy for tissue diagnosis and then go from there.   7/26 status post liver biopsy on 7/25 by IR  Path pending  Oncology CEA elevated in the 500s, possible metastatic colon cancer Her options include chemo versus comfort care/hospice.  Her performance status, lack of family support or her barriers.  She is undecided and would like to wait for biopsy to come back   Leukocytosis Tmax 100 On unasyn for below Since wbc trending up, will consult ID Ck cxr Ck bcx    Acute osteomyelitis of mandible Possible metastatic lesion right mandible Patient has right-sided jaw pain and swelling with leukocytosis of 27,000 and abnormal CT findings CT shows: Expanded right mandibular body with surrounding soft tissue and multifocal cortical destruction, likely indicating active osteomyelitis with an intra osseous necrotic or purulence collection Continue Unasyn and transition to Augmentin for discharge ENT consult to follow, though based on conversation with ED provider no surgical intervention recommended due to likely malignant and infectious component Pain control Continue unasyn. Spoke to ID Dr. Linus Salmons on call, recommended on discharge 4 weeks of augmentin with f/u with ENT or ID. 7/23 ENT input appreciated. Neoplasm uncertain behavior mandible with 2/2 infection. No acute abscess per ENT. No drainage , likely some dead necrotic tissue with  2/2ary infection.  Peridex mouth rinse  ?ENT bx possibly if needed by oncology 7/26 wbc up as above.  Will consult ID.         Chronic respiratory failure with Hypoxia Was discharged previously on 2L on 7/16.  Likely from lung mets and possibly underlying OSA 7/25 continue 02 suppl. Keeping sat >92%         Frailty syndrome in geriatric patient Patient with a history of recent frequent falls resulting in injury, untreated metastatic cancer, recently hospitalized and discharged to SNF [facility] and nutritionist consult 7/25 PT recommends SNF      Essential HTN Bp has room for improvement Add amlodipine 75m qd Continue beta blk   Chronic pain Continue gabapentin and acetaminophen   PAD (peripheral artery disease) ([place] Continue pravastatin   Type 2 diabetes mellitus ([place] Continue Jardiance and metformin and sliding scale insulin coverage         DVT prophylaxis: lovenox Code Status:full Family Communication: none Disposition Plan: SNF Status is: inpatient The patient remains inpatient due to need of pain mx, w/u pending , and iv treatment        LOS: 4 days   Time spent: 35 min    SNolberto Hanlon MD [facility] Pager [phone]  If 7PM-7AM, please contact amion

## 2022-04-20 NOTE — Progress Notes (Signed)
Pharmacy Antibiotic Note  April [name] is a 76 y.o. female w/ PMH of DM, HTN, PVD recently hospitalized from 7/8 to 7/16 for frequent falls and discharged to SNF, Klebsiella UTI, CRC metastasized to liver, lung, bone  admitted on 04/15/2022 with  jaw infection .  Pharmacy has been consulted for unasyn dosing.  Plan:   Day 5- Unasyn 3 grams IV every 6 hours   Height: 5\' 6"  (167.6 cm) Weight: 91.4 kg (201 lb 8 oz) IBW/kg (Calculated) : 59.3  Temp (24hrs), Avg:98.9 F (37.2 C), Min:98 F (36.7 C), Max:100 F (37.8 C)  Recent Labs  Lab 04/15/22 1442 04/15/22 1911 04/16/22 0402 04/17/22 0451 04/17/22 1018 04/18/22 0430  WBC 27.6*  --  28.4*  --  25.4*  --   CREATININE 0.41*  --  0.50 0.49  --  0.47  LATICACIDVEN  --  1.3  --  1.7  --   --      Estimated Creatinine Clearance: 69.2 mL/min (by C-G formula based on SCr of 0.47 mg/dL).    Allergies  Allergen Reactions   Prednisone Anaphylaxis   Other     Antimicrobials this admission: Unasyn 7/21  (evening) >>   vancomycin  7/22 >> 7/24  Microbiology results: 7/21 BCx: 1/4 S epidermidis MecA/C+  Thank you for allowing pharmacy to be a part of this patient's care.  Sherisse Fullilove A 04/20/2022 9:38 AM

## 2022-04-21 ENCOUNTER — Other Ambulatory Visit: Payer: Self-pay | Admitting: Anatomic Pathology & Clinical Pathology

## 2022-04-21 DIAGNOSIS — C787 Secondary malignant neoplasm of liver and intrahepatic bile duct: Secondary | ICD-10-CM | POA: Diagnosis not present

## 2022-04-21 DIAGNOSIS — Z515 Encounter for palliative care: Secondary | ICD-10-CM | POA: Diagnosis not present

## 2022-04-21 DIAGNOSIS — K5900 Constipation, unspecified: Secondary | ICD-10-CM | POA: Diagnosis not present

## 2022-04-21 DIAGNOSIS — J9611 Chronic respiratory failure with hypoxia: Secondary | ICD-10-CM | POA: Diagnosis not present

## 2022-04-21 DIAGNOSIS — M272 Inflammatory conditions of jaws: Secondary | ICD-10-CM | POA: Diagnosis not present

## 2022-04-21 DIAGNOSIS — C189 Malignant neoplasm of colon, unspecified: Secondary | ICD-10-CM | POA: Diagnosis not present

## 2022-04-21 DIAGNOSIS — C7951 Secondary malignant neoplasm of bone: Secondary | ICD-10-CM | POA: Diagnosis not present

## 2022-04-21 DIAGNOSIS — G8929 Other chronic pain: Secondary | ICD-10-CM | POA: Diagnosis not present

## 2022-04-21 LAB — GLUCOSE, CAPILLARY
Glucose-Capillary: 149 mg/dL — ABNORMAL HIGH (ref 70–99)
Glucose-Capillary: 159 mg/dL — ABNORMAL HIGH (ref 70–99)
Glucose-Capillary: 96 mg/dL (ref 70–99)
Glucose-Capillary: 103 mg/dL — ABNORMAL HIGH (ref 70–99)
Glucose-Capillary: 124 mg/dL — ABNORMAL HIGH (ref 70–99)

## 2022-04-21 LAB — CBC
HCT: 41.7 % (ref 36.0–46.0)
Hemoglobin: 12.8 g/dL (ref 12.0–15.0)
MCH: 27.4 pg (ref 26.0–34.0)
MCHC: 30.7 g/dL (ref 30.0–36.0)
MCV: 89.1 fL (ref 80.0–100.0)
Platelets: 337 10*3/uL (ref 150–400)
RBC: 4.68 MIL/uL (ref 3.87–5.11)
RDW: 16.7 % — ABNORMAL HIGH (ref 11.5–15.5)
WBC: 22.9 10*3/uL — ABNORMAL HIGH (ref 4.0–10.5)
nRBC: 0.1 % (ref 0.0–0.2)

## 2022-04-21 LAB — SURGICAL PATHOLOGY

## 2022-04-21 LAB — POTASSIUM: Potassium: 2.9 mmol/L — ABNORMAL LOW (ref 3.5–5.1)

## 2022-04-21 MED ORDER — FUROSEMIDE 20 MG PO TABS
20.0000 mg | ORAL_TABLET | Freq: Once | ORAL | Status: AC
Start: 2022-04-21 — End: 2022-04-21
  Administered 2022-04-21: 20 mg via ORAL
  Filled 2022-04-21: qty 1

## 2022-04-21 MED ORDER — BISACODYL 10 MG RE SUPP: 10.0000 mg | Freq: Every day | RECTAL | Status: DC | PRN

## 2022-04-21 NOTE — Progress Notes (Signed)
[place] at [facility] [phone]) [phone] [phone]   Name: April [name] Date: 04/21/2022 MRN: [id number]  DOB: [birth year]  Patient Care Team: Albina Billet, MD as PCP - General (Internal Medicine)    REASON FOR CONSULTATION: April [name] is a 76 y.o. female with multiple medical problems including DM, hypertension, PVD, history of lung mass but declined further work-up.  Patient was recently hospitalized 7/8 to 04/10/2022 for falls and was treated for UTI.  She was readmitted 04/15/2022 with possible acute osteomyelitis of the mandible.  Work-up including CTs appear consistent with widespread neoplastic process involving the colon, liver, and bone. Marland Kitchen   CODE STATUS: Full code  PAST MEDICAL HISTORY: Past Medical History:  Diagnosis Date   Acute renal failure (ARF) ([place])    Difficult intubation    Hypertension    NSTEMI (non-ST elevated myocardial infarction) ([place])    Paroxysmal atrial fibrillation (HCC)    Renal disorder    Sepsis [facility]) January 2017   [facility]   Type 2 diabetes mellitus ([place])     PAST SURGICAL HISTORY:  Past Surgical History:  Procedure Laterality Date   broken hip  2019   CYSTOSCOPY WITH STENT PLACEMENT Right 11/03/2015   Procedure: CYSTOSCOPY WITH STENT PLACEMENT;  Surgeon: Hollice Espy, MD;  Location: [facility] ORS;  Service: Urology;  Laterality: Right;   FRACTURE SURGERY Right    Elbow   INTRAMEDULLARY (IM) NAIL INTERTROCHANTERIC Right 02/07/2019   Procedure: INTRAMEDULLARY (IM) NAIL INTERTROCHANTRIC;  Surgeon: Thornton Park, MD;  Location: [facility] ORS;  Service: Orthopedics;  Laterality: Right;   TONSILLECTOMY     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    ALLERGIES:  is allergic to  prednisone and other.  MEDICATIONS:  Current Facility-Administered Medications  Medication Dose Route Frequency Provider Last Rate Last Admin   acetaminophen (TYLENOL) tablet 650 mg  650 mg Oral Q6H PRN Athena Masse, MD   650 mg at 04/18/22 2038   Or   acetaminophen (TYLENOL) suppository 650 mg  650 mg Rectal Q6H PRN Athena Masse, MD       amLODipine (NORVASC) tablet 5 mg  5 mg Oral Daily Nolberto Hanlon, MD   5 mg at 04/21/22 1052   Ampicillin-Sulbactam (UNASYN) 3 g in sodium chloride 0.9 % 100 mL IVPB  3 g Intravenous Q6H Chinita Greenland A, RPH 200 mL/hr at 04/21/22 1621 3 g at 04/21/22 1621   bisacodyl (DULCOLAX) suppository 10 mg  10 mg Rectal Once Nolberto Hanlon, MD       bisacodyl (DULCOLAX) suppository 10 mg  10 mg Rectal Daily PRN Nolberto Hanlon, MD       chlorhexidine (PERIDEX) 0.12 % solution 10 mL  10 mL Mouth/Throat QID Beverly Gust, MD   10 mL at 04/21/22 1418   docusate sodium (COLACE) capsule 100 mg  100 mg Oral BID Judd Gaudier V, MD   100 mg at 04/21/22 1052   empagliflozin (JARDIANCE) tablet 10 mg  10 mg Oral Daily Judd Gaudier V, MD   10 mg at 04/21/22 1053   enoxaparin (LOVENOX) injection 45 mg  0.5 mg/kg Subcutaneous Q24H Judd Gaudier V, MD   45 mg at 04/20/22 2304   gabapentin (NEURONTIN) capsule 300 mg  300 mg Oral TID Judd Gaudier  V, MD   300 mg at 04/21/22 1622   insulin aspart (novoLOG) injection 0-15 Units  0-15 Units Subcutaneous TID WC Athena Masse, MD   3 Units at 04/21/22 1215   insulin aspart (novoLOG) injection 0-5 Units  0-5 Units Subcutaneous QHS Athena Masse, MD       metFORMIN (GLUCOPHAGE) tablet 500 mg  500 mg Oral BID Athena Masse, MD   500 mg at 04/21/22 8676   metoprolol tartrate (LOPRESSOR) tablet 25 mg  25 mg Oral BID Athena Masse, MD   25 mg at 04/21/22 1053   morphine (PF) 2 MG/ML injection 2 mg  2 mg Intravenous Q2H PRN Athena Masse, MD   2 mg at 04/21/22 1131   ondansetron (ZOFRAN) tablet 4 mg  4 mg Oral Q6H PRN Athena Masse, MD       Or   ondansetron [facility]) injection 4 mg  4 mg Intravenous Q6H PRN Athena Masse, MD   4 mg at 04/16/22 0057   oxyCODONE (Oxy IR/ROXICODONE) immediate release tablet 5 mg  5 mg Oral Q6H PRN Nolberto Hanlon, MD   5 mg at 04/21/22 1418   polyethylene glycol (MIRALAX / GLYCOLAX) packet 17 g  17 g Oral BID Nolberto Hanlon, MD   17 g at 04/21/22 1051   pravastatin (PRAVACHOL) tablet 40 mg  40 mg Oral Daily Athena Masse, MD   40 mg at 04/21/22 1052   sodium phosphate (FLEET) 7-19 GM/118ML enema 1 enema  1 enema Rectal Daily PRN Nolberto Hanlon, MD        VITAL SIGNS: BP 113/71 (BP Location: Left Arm)   Pulse (!) 103   Temp 97.9 F (36.6 C)   Resp 18   Ht 5' 6" (1.676 m)   Wt 201 lb 8 oz (91.4 kg)   SpO2 92%   BMI 32.52 kg/m  Filed Weights   04/15/22 1441  Weight: 201 lb 8 oz (91.4 kg)    Estimated body mass index is 32.52 kg/m as calculated from the following:   Height as of this encounter: 5' 6" (1.676 m).   Weight as of this encounter: 201 lb 8 oz (91.4 kg).  LABS: CBC:    Component Value Date/Time   WBC 22.9 (H) 04/21/2022 1135   HGB 12.8 04/21/2022 1135   HCT 41.7 04/21/2022 1135   PLT 337 04/21/2022 1135   MCV 89.1 04/21/2022 1135   NEUTROABS 16.5 (H) 04/06/2022 0819   LYMPHSABS 0.8 04/06/2022 0819   MONOABS 1.2 (H) 04/06/2022 0819   EOSABS 0.5 04/06/2022 0819   BASOSABS 0.1 04/06/2022 0819   Comprehensive Metabolic Panel:    Component Value Date/Time   NA 138 04/20/2022 1545   K 2.9 (L) 04/21/2022 1135   CL 97 (L) 04/20/2022 1545   CO2 31 04/20/2022 1545   BUN 9 04/20/2022 1545   CREATININE 0.38 (L) 04/20/2022 1545   GLUCOSE 121 (H) 04/20/2022 1545   CALCIUM 8.6 (L) 04/20/2022 1545   AST 48 (H) 04/15/2022 1442   ALT 15 04/15/2022 1442   ALKPHOS 561 (H) 04/15/2022 1442   BILITOT 1.2 04/15/2022 1442   PROT 6.9 04/15/2022 1442   ALBUMIN 2.4 (L) 04/15/2022 1442    RADIOGRAPHIC STUDIES: DG Chest Port 1 View  Result Date: 04/20/2022 CLINICAL DATA:   Fever, altered mental status EXAM: PORTABLE CHEST 1 VIEW COMPARISON:  Previous studies including the examination of 04/02/2022 FINDINGS: Cardiac size is within normal limits. Thoracic aorta  is tortuous and ectatic. Central pulmonary vessels are prominent. Increased interstitial markings and patchy alveolar densities are seen in the parahilar regions and lower lung fields. Lateral CP angles are clear. There is no pneumothorax. Degenerative changes are noted in both shoulders. IMPRESSION: Central pulmonary vessels are prominent. Increased interstitial and alveolar markings in the parahilar regions and lower lung fields may suggest pulmonary edema and possibly multifocal pneumonia. Electronically Signed   By: Elmer Picker M.D.   On: 04/20/2022 13:47   US BIOPSY (LIVER)  Result Date: 04/19/2022 INDICATION: Hepatic masses including large left lobe hepatic mass, multiple pulmonary masses and sigmoid colonic mass. The patient presents for liver biopsy. EXAM: ULTRASOUND GUIDED CORE BIOPSY OF LIVER MEDICATIONS: None. ANESTHESIA/SEDATION: Moderate (conscious) sedation was employed during this procedure. A total of Fentanyl 50 mcg was administered intravenously by radiology nursing. Moderate Sedation Time: 15 minutes. The patient's level of consciousness and vital signs were monitored continuously by radiology nursing throughout the procedure under my direct supervision. PROCEDURE: The procedure, risks, benefits, and alternatives were explained to the patient. Questions regarding the procedure were encouraged and answered. The patient understands and consents to the procedure. A time-out was performed prior to initiating the procedure. The abdominal wall was prepped with chlorhexidine in a sterile fashion, and a sterile drape was applied covering the operative field. A sterile gown and sterile gloves were used for the procedure. Local anesthesia was provided with 1% Lidocaine. After localizing a left lobe liver mass  by ultrasound, a 17 gauge trocar needle was advanced into the lesion under direct ultrasound guidance. Three separate coaxial 18 gauge core biopsy samples were obtained and submitted in formalin. Gel-Foam pledgets were advanced through the outer needle as it was retracted and removed. Additional ultrasound was performed. COMPLICATIONS: None immediate. FINDINGS: Large hyperechoic oval-shaped mass in the left lobe of the liver measures up to 7 cm in greatest diameter by ultrasound. Solid tissue was obtained from the lesion. IMPRESSION: Ultrasound-guided core biopsy performed of a large mass in the left lobe of the liver measuring up to approximately 7 cm in greatest diameter. Electronically Signed   By: Aletta Edouard M.D.   On: 04/19/2022 11:39   CT ABDOMEN PELVIS W CONTRAST  Result Date: 04/15/2022 CLINICAL DATA:  Abdominal pain and distention EXAM: CT ABDOMEN AND PELVIS WITH CONTRAST TECHNIQUE: Multidetector CT imaging of the abdomen and pelvis was performed using the standard protocol following bolus administration of intravenous contrast. RADIATION DOSE REDUCTION: This exam was performed according to the departmental dose-optimization program which includes automated exposure control, adjustment of the mA and/or kV according to patient size and/or use of iterative reconstruction technique. CONTRAST:  110m OMNIPAQUE IOHEXOL 300 MG/ML  SOLN COMPARISON:  Previous studies including the CT abdomen and pelvis done on 01/05/2016 and CT chest done on 01/29/2022 FINDINGS: Lower chest: There are multiple nodules of varying sizes in the lower lung fields. There is a 3.2 cm nodule/mass in the left lower lobe. There are small patchy infiltrates in both lower lobes. Minimal bilateral pleural effusions are seen. Dense calcification is seen in mitral annulus. Hepatobiliary: There is interval appearance of multiple low-density space-occupying lesions in liver largest measuring 7.5 x 4.1 cm in the anterior aspect of the  liver. There is no dilation of bile ducts. There is increased density in the dependent portion of gallbladder suggesting gallbladder stones. Pancreas: There is atrophy.  No focal abnormalities are seen. Spleen: Unremarkable. Adrenals/Urinary Tract: There is 11 mm nodule in left adrenal. There are small  nodular densities in right adrenal measuring up to 11 mm. There is no hydronephrosis. Lobulations are seen in the margins of the kidneys, possibly suggesting scarring. There is 17 mm calculus in the upper pole of right kidney. There is 12 mm calculus in the midportion. There is 12 mm calculus in the lower pole. Ureters are not dilated. Urinary bladder is distended. There is air in the bladder, possibly suggesting recent instrumentation. Diverticulum is noted in the left posterior aspect of the urinary bladder. Stomach/Bowel: Small hiatal hernia is seen. Stomach is not distended. Small bowel loops not dilated. Appendix is not dilated. Scattered diverticula seen in colon without signs of focal acute diverticulitis. There is wall thickening in 5.7 cm segment of sigmoid colon in midline. Oral contrast has reached the rectum. There is large amount of stool in rectum. Transverse diameter of rectum measures 9.4 cm. There is no significant wall thickening in rectum. Vascular/Lymphatic: Scattered arterial calcifications are seen. Reproductive: There is low-density in the central portion of the uterus. There is 2.2 cm fluid density structure in the right adnexa. Other: There is no ascites or pneumoperitoneum. Umbilical hernia containing fat is seen. Musculoskeletal: There is previous internal fixation in right femur with intramedullary rod. Degenerative changes are noted in right hip. Degenerative changes are noted in lower thoracic spine and lumbar spine. There is fracture in the upper endplate of body of L3 vertebra which may be recent or old. Decrease in height of multiple thoracic vertebral bodies may suggest recent or old  compression fractures. There is inhomogeneous in attenuation in the bony structures this may be due to osteoporosis or infiltrative neoplastic process. There is a lytic lesion with adjacent soft tissue mass in the posterior aspect of left iliac crest. IMPRESSION: There is 5.7 cm mass lesion in sigmoid colon suggesting possible primary malignant neoplasm. There is no evidence of intestinal obstruction or pneumoperitoneum. There is no hydronephrosis. There are multiple nodules of varying sizes in the visualized lower lung fields consistent with pulmonary metastatic disease. Patchy infiltrates in the lower lung fields may suggest atelectasis/pneumonia. Small bilateral pleural effusions. There are multiple space-occupying lesions of varying sizes in the liver measuring up to 7.5 cm in size suggesting hepatic metastatic disease. There are few nodular densities in both adrenals suggesting possible metastatic disease. There is inhomogeneous attenuation in the bony structures suggesting possible skeletal metastatic disease. There is a lytic lesion in the posterior medial aspect of left iliac crest consistent with skeletal metastatic disease. Other findings as described in the body of the report. Electronically Signed   By: Elmer Picker M.D.   On: 04/15/2022 20:16   CT Maxillofacial W Contrast  Result Date: 04/15/2022 CLINICAL DATA:  Facial abscess EXAM: CT MAXILLOFACIAL WITH CONTRAST TECHNIQUE: Multidetector CT imaging of the maxillofacial structures was performed with intravenous contrast. Multiplanar CT image reconstructions were also generated. RADIATION DOSE REDUCTION: This exam was performed according to the departmental dose-optimization program which includes automated exposure control, adjustment of the mA and/or kV according to patient size and/or use of iterative reconstruction technique. CONTRAST:  160m OMNIPAQUE IOHEXOL 300 MG/ML  SOLN COMPARISON:  None Available. FINDINGS: Osseous: The right  mandibular body is expanded and hyperlucent with the abnormal area encompassing the roots of the right mandibular molars. There is multifocal cortical destruction. There is a surrounding soft tissue component Orbits: Negative. No traumatic or inflammatory finding. Sinuses: Clear. Soft tissues: Soft tissue swelling surrounding the right mandibular body. Limited intracranial: No significant or unexpected finding. IMPRESSION: Expanded right  mandibular body with surrounding soft tissue and multifocal cortical destruction, likely indicating active osteomyelitis with an intra osseous necrotic or purulence collection. Clinical follow-up after resolution of symptoms is recommended to exclude a superimposed neoplastic process. Electronically Signed   By: Ulyses Jarred M.D.   On: 04/15/2022 20:04   DG Abdomen 1 View  Result Date: 04/15/2022 CLINICAL DATA:  Constipation.  Abdominal distension. EXAM: ABDOMEN - 1 VIEW COMPARISON:  04/09/22 FINDINGS: Gallstones are identified. There is been interval decrease in previously noted air-filled loops of small bowel within the left hemiabdomen. Progressive accumulation of stool is identified throughout the colon and rectum. Large volume of desiccated stool within the rectum has increased in volume compared with the previous study. Cannot rule out rectal impaction. No signs of pneumoperitoneum. Lumbar scoliosis and degenerative disc disease. Status post ORIF of the right femur. Marked degenerative disc disease is noted involving the right hip. Moderate degenerative disc disease noted in the left hip. IMPRESSION: 1. Progressive accumulation of stool throughout the colon and rectum compatible with constipation. Cannot rule out rectal impaction. 2. Interval decrease in previously noted air-filled loops of small bowel within the left hemiabdomen. 3. Gallstones. Electronically Signed   By: Kerby Moors M.D.   On: 04/15/2022 16:13   DG Abd 1 View  Result Date: 04/09/2022 CLINICAL  DATA:  Constipation. EXAM: ABDOMEN - 1 VIEW COMPARISON:  Pelvis radiograph 04/02/2022 FINDINGS: Prominent stool noted at the rectum. Mild gaseous distension above this point. No definite free air. Calcifications in the right abdomen likely gallstones. IMPRESSION: 1. Prominent stool at the rectum with mild gaseous distension above this point. 2. Cholelithiasis. Electronically Signed   By: San Morelle M.D.   On: 04/09/2022 12:27   DG Chest Portable 1 View  Result Date: 04/02/2022 CLINICAL DATA:  Recent fall with right-sided pain, initial encounter EXAM: PORTABLE CHEST 1 VIEW COMPARISON:  01/28/2022 FINDINGS: Cardiac shadow is within normal limits. Aortic calcifications are again seen. Increased vascular congestion and mild edema is noted. No focal confluent infiltrate is seen. Old rib fractures are noted bilaterally with healing. IMPRESSION: No acute abnormality noted. Electronically Signed   By: Inez Catalina M.D.   On: 04/02/2022 21:50   DG Knee 2 Views Right  Result Date: 04/02/2022 CLINICAL DATA:  Right leg pain following fall, initial encounter EXAM: RIGHT KNEE - 2 VIEW COMPARISON:  None Available. FINDINGS: Postsurgical changes in the distal femur are noted. No acute fracture is noted. IMPRESSION: No acute abnormality noted. Electronically Signed   By: Inez Catalina M.D.   On: 04/02/2022 21:49   DG Hip Unilat W or Wo Pelvis 2-3 Views Right  Result Date: 04/02/2022 CLINICAL DATA:  Right-sided hip pain following fall several hours ago, initial encounter EXAM: DG HIP (WITH OR WITHOUT PELVIS) 2-3V RIGHT COMPARISON:  02/07/2019 FINDINGS: Pelvic ring is intact. Postsurgical changes in the proximal right femur are noted. Degenerative changes of the right hip joint are noted. No acute fracture or dislocation is seen. No soft tissue changes are noted. IMPRESSION: Degenerative change in the right hip joint. No acute abnormality is noted. Electronically Signed   By: Inez Catalina M.D.   On: 04/02/2022 21:48     PERFORMANCE STATUS (ECOG) : 4 - Bedbound  Review of Systems Unless otherwise noted, a complete review of systems is negative.  Physical Exam General: NAD Pulmonary: Exertionally labored, on O2 Abdomen: soft, nontender, + bowel sounds GU: no suprapubic tenderness Extremities: no edema, no joint deformities Skin: no rashes Neurological: Weakness  but otherwise nonfocal  IMPRESSION: I met again with patient to discuss goals.  Together, we reviewed work-up, diagnosis, and prognosis.  Patient is tangential and fragmented in her processing.  She was perseverating on a dream that left her feeling fearful.  Patient says that she recognizes that she has "stage IV" cancer but says that she is hopeful that she will live another 30 to 40 years.  She started counting up decades from 75 to illustrate this point.  I tried to compassionately explain that she is very likely nearing end-of-life given her terminal/incurable colorectal cancer.  Patient is frail at baseline and likely not a good treatment candidate.  Patient says that the world is "a beautiful place" and that she is not ready to leave.  I encouraged patient to think about end-of-life decision making including CODE STATUS.  I called and spoke with her friend, [name].  She says that she has been trying to get patient to consider end-of-life decision-making and complete advance directives but she feels the patient "is in denial."  She felt like focusing on comfort and pursuing hospice would likely be the most appropriate course of action.  She plans to speak with patient about decision-making.  PLAN: -Continue current scope of treatment -Chaplain to assist with ACP -Family to speak with patient regarding goals -Will follow  Case and plan discussed with Dr. Tasia Catchings   Time Total: 60 minutes  Visit consisted of counseling and education dealing with the complex and emotionally intense issues of symptom management and palliative care in the  setting of serious and potentially life-threatening illness.Greater than 50%  of this time was spent counseling and coordinating care related to the above assessment and plan.  Signed by: Altha Harm, PhD, NP-C

## 2022-04-21 NOTE — Progress Notes (Addendum)
Hematology/Oncology Progress note [phone] [phone]     Patient Care Team: Albina Billet, MD as PCP - General (Internal Medicine)   Name of the patient: April [name]  [id number]  03/24/1946  Date of visit: 04/21/22   INTERVAL HISTORY-  Patient was seen this afternoon.  Left liver lobe biopsy is positive for malignancy, metastatic adenocarcinoma, compatible with colorectal adenocarcinoma. Pain is controlled.  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   Colon cancer metastasized to liver, lung and bone ([place]) 04/15/2022   Acute osteomyelitis of mandible 04/15/2022   Chronic pain 04/15/2022   Constipation 04/15/2022   Frailty syndrome in geriatric patient 04/15/2022   Vitamin D deficiency 04/05/2022   UTI (urinary tract infection) 04/05/2022   Fall at home, initial encounter 04/03/2022   Frequent falls 04/03/2022   Mass of lower lobe of left lung on CT chest 01/30/22    Closed displaced fracture of surgical neck of humerus, unspecified fracture morphology, initial encounter    Acute cystitis without hematuria    Abnormal CT of the chest    Hypoxia 01/28/2022   Lymphedema 07/02/2019   S/P right hip fracture 02/05/2019   PAD (peripheral artery disease) ([place]) 10/03/2017   Essential hypertension 10/03/2017   Pain in limb 03/21/2017   Swelling of limb 10/18/2016   Ulcer of calf ([place]) 10/18/2016   Gangrene of foot ([place]) 03/03/2016   UPJ (ureteropelvic junction) obstruction    Paroxysmal atrial fibrillation (H[facility]) 12/04/2015   Back pain 12/04/2015    Thrombocytopenia ([place]) 12/03/2015   Leukocytosis 12/03/2015   Type 2 diabetes mellitus ([place]) 12/03/2015   Respiratory failure ([place])    Proteus septicemia ([place])    Acute renal failure (ARF) ([place])    Hydronephrosis    Endotracheally intubated    Respiratory distress    Arterial hypotension    Sepsis ([place]) 10/20/2015   Acute renal failure (HCC)    Altered mental status    NSTEMI (non-ST elevated myocardial infarction) ([place])    Sepsis due to urinary tract infection [facility])      Past Medical History:  Diagnosis Date   Acute renal failure (ARF) ([place])    Difficult intubation    Hypertension    NSTEMI (non-ST elevated myocardial infarction) ([place])    Paroxysmal atrial fibrillation ([place])    Renal disorder    Sepsis [facility]) January 2017   [facility]   Type 2 diabetes mellitus [facility])      Past Surgical History:  Procedure Laterality Date   broken hip  2019   CYSTOSCOPY WITH STENT PLACEMENT Right 11/03/2015   Procedure: CYSTOSCOPY WITH STENT PLACEMENT;  Surgeon: Hollice Espy, MD;  Location: [facility] ORS;  Service: Urology;  Laterality: Right;   FRACTURE SURGERY Right    Elbow   INTRAMEDULLARY (IM) NAIL INTERTROCHANTERIC Right 02/07/2019   Procedure: INTRAMEDULLARY (IM) NAIL INTERTROCHANTRIC;  Surgeon: Thornton Park, MD;  Location: [facility] ORS;  Service: Orthopedics;  Laterality: Right;   TONSILLECTOMY     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   Procedure: TRANSMETATARSAL AMPUTATION;  Surgeon: Algernon Huxley, MD;  Location: [facility]  ORS;  Service: Vascular;  Laterality: Bilateral;    Social History   Socioeconomic History   Marital status: Single    Spouse name: Not on file   Number of children: Not on file   Years of education: Not on file   Highest education level: Not on file  Occupational History   Not on file  Tobacco Use   Smoking status: Never   Smokeless tobacco: Never  Substance and Sexual Activity   Alcohol use: No    Alcohol/week: 0.0 standard drinks of alcohol   Drug use:  No   Sexual activity: Not on file  Other Topics Concern   Not on file  Social History Narrative   Not on file   Social Determinants of Health   Financial Resource Strain: Not on file  Food Insecurity: Not on file  Transportation Needs: Not on file  Physical Activity: Not on file  Stress: Not on file  Social Connections: Not on file  Intimate Partner Violence: Not on file     Family History  Problem Relation Age of Onset   Diabetes Father    Heart disease Father    Stroke Maternal Grandmother      Current Facility-Administered Medications:    acetaminophen (TYLENOL) tablet 650 mg, 650 mg, Oral, Q6H PRN, 650 mg at 04/18/22 2038 **OR** acetaminophen (TYLENOL) suppository 650 mg, 650 mg, Rectal, Q6H PRN, Athena Masse, MD   amLODipine (NORVASC) tablet 5 mg, 5 mg, Oral, Daily, Amery, Sahar, MD, 5 mg at 04/21/22 1052   Ampicillin-Sulbactam (UNASYN) 3 g in sodium chloride 0.9 % 100 mL IVPB, 3 g, Intravenous, Q6H, Merrill, Kristin A, RPH, Last Rate: 200 mL/hr at 04/21/22 1621, 3 g at 04/21/22 1621   bisacodyl (DULCOLAX) suppository 10 mg, 10 mg, Rectal, Once, Nolberto Hanlon, MD   bisacodyl (DULCOLAX) suppository 10 mg, 10 mg, Rectal, Daily PRN, Nolberto Hanlon, MD   chlorhexidine (PERIDEX) 0.12 % solution 10 mL, 10 mL, Mouth/Throat, QID, Beverly Gust, MD, 10 mL at 04/21/22 1418   docusate sodium (COLACE) capsule 100 mg, 100 mg, Oral, BID, Judd Gaudier V, MD, 100 mg at 04/21/22 1052   empagliflozin (JARDIANCE) tablet 10 mg, 10 mg, Oral, Daily, Judd Gaudier V, MD, 10 mg at 04/21/22 1053   enoxaparin (LOVENOX) injection 45 mg, 0.5 mg/kg, Subcutaneous, Q24H, Judd Gaudier V, MD, 45 mg at 04/20/22 2304   gabapentin (NEURONTIN) capsule 300 mg, 300 mg, Oral, TID, Judd Gaudier V, MD, 300 mg at 04/21/22 1622   insulin aspart (novoLOG) injection 0-15 Units, 0-15 Units, Subcutaneous, TID WC, Athena Masse, MD, 3 Units at 04/21/22 1215   insulin aspart (novoLOG) injection 0-5 Units, 0-5  Units, Subcutaneous, QHS, Judd Gaudier V, MD   metFORMIN (GLUCOPHAGE) tablet 500 mg, 500 mg, Oral, BID, Judd Gaudier V, MD, 500 mg at 04/21/22 6314   metoprolol tartrate (LOPRESSOR) tablet 25 mg, 25 mg, Oral, BID, Judd Gaudier V, MD, 25 mg at 04/21/22 1053   morphine (PF) 2 MG/ML injection 2 mg, 2 mg, Intravenous, Q2H PRN, Judd Gaudier V, MD, 2 mg at 04/21/22 1131   ondansetron (ZOFRAN) tablet 4 mg, 4 mg, Oral, Q6H PRN **OR** ondansetron (ZOFRAN) injection 4 mg, 4 mg, Intravenous, Q6H PRN, Judd Gaudier V, MD, 4 mg at 04/16/22 0057   oxyCODONE (Oxy IR/ROXICODONE) immediate release tablet 5 mg, 5 mg, Oral, Q6H PRN, Nolberto Hanlon, MD, 5 mg at 04/21/22 1418   polyethylene glycol (MIRALAX / GLYCOLAX) packet 17 g, 17 g, Oral, BID, Amery,  Sahar, MD, 17 g at 04/21/22 1051   pravastatin (PRAVACHOL) tablet 40 mg, 40 mg, Oral, Daily, Judd Gaudier V, MD, 40 mg at 04/21/22 1052   sodium phosphate (FLEET) 7-19 GM/118ML enema 1 enema, 1 enema, Rectal, Daily PRN, Nolberto Hanlon, MD   Physical exam:  Vitals:   04/21/22 0342 04/21/22 0738 04/21/22 1155 04/21/22 1555  BP: 131/78 131/69 122/62 113/71  Pulse: 97 85 94 (!) 103  Resp: 20 18 17 18   Temp: 98.5 F (36.9 C) 98.5 F (36.9 C) 98.1 F (36.7 C) 97.9 F (36.6 C)  TempSrc:      SpO2: 96% 94% 92% 92%  Weight:      Height:       Physical Exam Constitutional:      General: She is not in acute distress.    Appearance: She is obese. She is not diaphoretic.  HENT:     Head: Normocephalic and atraumatic.     Nose: Nose normal.  Eyes:     General: No scleral icterus.    Pupils: Pupils are equal, round, and reactive to light.  Cardiovascular:     Rate and Rhythm: Normal rate.  Pulmonary:     Effort: Pulmonary effort is normal. No respiratory distress.     Comments: On Nasal cannula oxygen Abdominal:     General: There is no distension.     Palpations: Abdomen is soft.  Musculoskeletal:        General: Normal range of motion.     Cervical  back: Normal range of motion and neck supple.  Skin:    General: Skin is warm and dry.     Findings: No erythema.  Neurological:     Mental Status: She is alert. Mental status is at baseline.     Cranial Nerves: No cranial nerve deficit.     Motor: No abnormal muscle tone.  Psychiatric:        Mood and Affect: Mood and affect normal.           Latest Ref Rng & Units 04/21/2022   11:35 AM  CMP  Potassium 3.5 - 5.1 mmol/L 2.9       Latest Ref Rng & Units 04/21/2022   11:35 AM  CBC  WBC 4.0 - 10.5 K/uL 22.9   Hemoglobin 12.0 - 15.0 g/dL 12.8   Hematocrit 36.0 - 46.0 % 41.7   Platelets 150 - 400 K/uL 337     RADIOGRAPHIC STUDIES: I have personally reviewed the radiological images as listed and agreed with the findings in the report. DG Chest Port 1 View  Result Date: 04/20/2022 CLINICAL DATA:  Fever, altered mental status EXAM: PORTABLE CHEST 1 VIEW COMPARISON:  Previous studies including the examination of 04/02/2022 FINDINGS: Cardiac size is within normal limits. Thoracic aorta is tortuous and ectatic. Central pulmonary vessels are prominent. Increased interstitial markings and patchy alveolar densities are seen in the parahilar regions and lower lung fields. Lateral CP angles are clear. There is no pneumothorax. Degenerative changes are noted in both shoulders. IMPRESSION: Central pulmonary vessels are prominent. Increased interstitial and alveolar markings in the parahilar regions and lower lung fields may suggest pulmonary edema and possibly multifocal pneumonia. Electronically Signed   By: Elmer Picker M.D.   On: 04/20/2022 13:47   US BIOPSY (LIVER)  Result Date: 04/19/2022 INDICATION: Hepatic masses including large left lobe hepatic mass, multiple pulmonary masses and sigmoid colonic mass. The patient presents for liver biopsy. EXAM: ULTRASOUND GUIDED CORE BIOPSY OF LIVER MEDICATIONS:  None. ANESTHESIA/SEDATION: Moderate (conscious) sedation was employed during this  procedure. A total of Fentanyl 50 mcg was administered intravenously by radiology nursing. Moderate Sedation Time: 15 minutes. The patient's level of consciousness and vital signs were monitored continuously by radiology nursing throughout the procedure under my direct supervision. PROCEDURE: The procedure, risks, benefits, and alternatives were explained to the patient. Questions regarding the procedure were encouraged and answered. The patient understands and consents to the procedure. A time-out was performed prior to initiating the procedure. The abdominal wall was prepped with chlorhexidine in a sterile fashion, and a sterile drape was applied covering the operative field. A sterile gown and sterile gloves were used for the procedure. Local anesthesia was provided with 1% Lidocaine. After localizing a left lobe liver mass by ultrasound, a 17 gauge trocar needle was advanced into the lesion under direct ultrasound guidance. Three separate coaxial 18 gauge core biopsy samples were obtained and submitted in formalin. Gel-Foam pledgets were advanced through the outer needle as it was retracted and removed. Additional ultrasound was performed. COMPLICATIONS: None immediate. FINDINGS: Large hyperechoic oval-shaped mass in the left lobe of the liver measures up to 7 cm in greatest diameter by ultrasound. Solid tissue was obtained from the lesion. IMPRESSION: Ultrasound-guided core biopsy performed of a large mass in the left lobe of the liver measuring up to approximately 7 cm in greatest diameter. Electronically Signed   By: Aletta Edouard M.D.   On: 04/19/2022 11:39   CT ABDOMEN PELVIS W CONTRAST  Result Date: 04/15/2022 CLINICAL DATA:  Abdominal pain and distention EXAM: CT ABDOMEN AND PELVIS WITH CONTRAST TECHNIQUE: Multidetector CT imaging of the abdomen and pelvis was performed using the standard protocol following bolus administration of intravenous contrast. RADIATION DOSE REDUCTION: This exam was  performed according to the departmental dose-optimization program which includes automated exposure control, adjustment of the mA and/or kV according to patient size and/or use of iterative reconstruction technique. CONTRAST:  173mL OMNIPAQUE IOHEXOL 300 MG/ML  SOLN COMPARISON:  Previous studies including the CT abdomen and pelvis done on 01/05/2016 and CT chest done on 01/29/2022 FINDINGS: Lower chest: There are multiple nodules of varying sizes in the lower lung fields. There is a 3.2 cm nodule/mass in the left lower lobe. There are small patchy infiltrates in both lower lobes. Minimal bilateral pleural effusions are seen. Dense calcification is seen in mitral annulus. Hepatobiliary: There is interval appearance of multiple low-density space-occupying lesions in liver largest measuring 7.5 x 4.1 cm in the anterior aspect of the liver. There is no dilation of bile ducts. There is increased density in the dependent portion of gallbladder suggesting gallbladder stones. Pancreas: There is atrophy.  No focal abnormalities are seen. Spleen: Unremarkable. Adrenals/Urinary Tract: There is 11 mm nodule in left adrenal. There are small nodular densities in right adrenal measuring up to 11 mm. There is no hydronephrosis. Lobulations are seen in the margins of the kidneys, possibly suggesting scarring. There is 17 mm calculus in the upper pole of right kidney. There is 12 mm calculus in the midportion. There is 12 mm calculus in the lower pole. Ureters are not dilated. Urinary bladder is distended. There is air in the bladder, possibly suggesting recent instrumentation. Diverticulum is noted in the left posterior aspect of the urinary bladder. Stomach/Bowel: Small hiatal hernia is seen. Stomach is not distended. Small bowel loops not dilated. Appendix is not dilated. Scattered diverticula seen in colon without signs of focal acute diverticulitis. There is wall thickening in 5.7 cm  segment of sigmoid colon in midline. Oral  contrast has reached the rectum. There is large amount of stool in rectum. Transverse diameter of rectum measures 9.4 cm. There is no significant wall thickening in rectum. Vascular/Lymphatic: Scattered arterial calcifications are seen. Reproductive: There is low-density in the central portion of the uterus. There is 2.2 cm fluid density structure in the right adnexa. Other: There is no ascites or pneumoperitoneum. Umbilical hernia containing fat is seen. Musculoskeletal: There is previous internal fixation in right femur with intramedullary rod. Degenerative changes are noted in right hip. Degenerative changes are noted in lower thoracic spine and lumbar spine. There is fracture in the upper endplate of body of L3 vertebra which may be recent or old. Decrease in height of multiple thoracic vertebral bodies may suggest recent or old compression fractures. There is inhomogeneous in attenuation in the bony structures this may be due to osteoporosis or infiltrative neoplastic process. There is a lytic lesion with adjacent soft tissue mass in the posterior aspect of left iliac crest. IMPRESSION: There is 5.7 cm mass lesion in sigmoid colon suggesting possible primary malignant neoplasm. There is no evidence of intestinal obstruction or pneumoperitoneum. There is no hydronephrosis. There are multiple nodules of varying sizes in the visualized lower lung fields consistent with pulmonary metastatic disease. Patchy infiltrates in the lower lung fields may suggest atelectasis/pneumonia. Small bilateral pleural effusions. There are multiple space-occupying lesions of varying sizes in the liver measuring up to 7.5 cm in size suggesting hepatic metastatic disease. There are few nodular densities in both adrenals suggesting possible metastatic disease. There is inhomogeneous attenuation in the bony structures suggesting possible skeletal metastatic disease. There is a lytic lesion in the posterior medial aspect of left iliac  crest consistent with skeletal metastatic disease. Other findings as described in the body of the report. Electronically Signed   By: Elmer Picker M.D.   On: 04/15/2022 20:16   CT Maxillofacial W Contrast  Result Date: 04/15/2022 CLINICAL DATA:  Facial abscess EXAM: CT MAXILLOFACIAL WITH CONTRAST TECHNIQUE: Multidetector CT imaging of the maxillofacial structures was performed with intravenous contrast. Multiplanar CT image reconstructions were also generated. RADIATION DOSE REDUCTION: This exam was performed according to the departmental dose-optimization program which includes automated exposure control, adjustment of the mA and/or kV according to patient size and/or use of iterative reconstruction technique. CONTRAST:  175mL OMNIPAQUE IOHEXOL 300 MG/ML  SOLN COMPARISON:  None Available. FINDINGS: Osseous: The right mandibular body is expanded and hyperlucent with the abnormal area encompassing the roots of the right mandibular molars. There is multifocal cortical destruction. There is a surrounding soft tissue component Orbits: Negative. No traumatic or inflammatory finding. Sinuses: Clear. Soft tissues: Soft tissue swelling surrounding the right mandibular body. Limited intracranial: No significant or unexpected finding. 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 Clinical follow-up after resolution of symptoms is recommended to exclude a superimposed neoplastic process. Electronically Signed   By: Ulyses Jarred M.D.   On: 04/15/2022 20:04   DG Abdomen 1 View  Result Date: 04/15/2022 CLINICAL DATA:  Constipation.  Abdominal distension. EXAM: ABDOMEN - 1 VIEW COMPARISON:  04/09/22 FINDINGS: Gallstones are identified. There is been interval decrease in previously noted air-filled loops of small bowel within the left hemiabdomen. Progressive  accumulation of stool is identified throughout the colon and rectum. Large volume of desiccated stool within the rectum has increased in volume compared with  the previous study. Cannot rule out rectal impaction. No signs of pneumoperitoneum. Lumbar scoliosis and degenerative disc disease. Status post ORIF of the right femur. Marked degenerative disc disease is noted involving the right hip. Moderate degenerative disc disease noted in the left hip. IMPRESSION: 1. Progressive accumulation of stool throughout the colon and rectum compatible with constipation. Cannot rule out rectal impaction. 2. Interval decrease in previously noted air-filled loops of small bowel within the left hemiabdomen. 3. Gallstones. Electronically Signed   By: Kerby Moors M.D.   On: 04/15/2022 16:13   DG Abd 1 View  Result Date: 04/09/2022 CLINICAL DATA:  Constipation. EXAM: ABDOMEN - 1 VIEW COMPARISON:  Pelvis radiograph 04/02/2022 FINDINGS: Prominent stool noted at the rectum. Mild gaseous distension above this point. No definite free air. Calcifications in the right abdomen likely gallstones. IMPRESSION: 1. Prominent stool at the rectum with mild gaseous distension above this point. 2. Cholelithiasis. Electronically Signed   By: San Morelle M.D.   On: 04/09/2022 12:27   DG Chest Portable 1 View  Result Date: 04/02/2022 CLINICAL DATA:  Recent fall with right-sided pain, initial encounter EXAM: PORTABLE CHEST 1 VIEW COMPARISON:  01/28/2022 FINDINGS: Cardiac shadow is within normal limits. Aortic calcifications are again seen. Increased vascular congestion and mild edema is noted. No focal confluent infiltrate is seen. Old rib fractures are noted bilaterally with healing. IMPRESSION: No acute abnormality noted. Electronically Signed   By: Inez Catalina M.D.   On: 04/02/2022 21:50   DG Knee 2 Views Right  Result Date: 04/02/2022 CLINICAL DATA:  Right leg pain following fall, initial encounter EXAM: RIGHT KNEE - 2 VIEW  COMPARISON:  None Available. FINDINGS: Postsurgical changes in the distal femur are noted. No acute fracture is noted. IMPRESSION: No acute abnormality noted. Electronically Signed   By: Inez Catalina M.D.   On: 04/02/2022 21:49   DG Hip Unilat W or Wo Pelvis 2-3 Views Right  Result Date: 04/02/2022 CLINICAL DATA:  Right-sided hip pain following fall several hours ago, initial encounter EXAM: DG HIP (WITH OR WITHOUT PELVIS) 2-3V RIGHT COMPARISON:  02/07/2019 FINDINGS: Pelvic ring is intact. Postsurgical changes in the proximal right femur are noted. Degenerative changes of the right hip joint are noted. No acute fracture or dislocation is seen. No soft tissue changes are noted. IMPRESSION: Degenerative change in the right hip joint. No acute abnormality is noted. Electronically Signed   By: Inez Catalina M.D.   On: 04/02/2022 21:48    Assessment and plan-   #Metastatic colon cancer with liver, bone, and lung involvement. CEA is elevated in the 500s.   Biopsy results were reviewed and discussed with patient. Patient has metastatic stage IV colon cancer.  We discussed that condition is not curable and her prognosis is poor. Patient has very poor performance status, widespread disease, lack of family support.  I do not think that she is a candidate for chemotherapy.  I recommend hospice comfort care.  Patient appreciates mentation and is considering hospice/comfort care.  She will have more discussions with her friend and update team.  Palliative care service following.  Neoplasm associated pain, controlled well with current pain regimen.  Mandible soft tissue lesion, discussed with Dr.Ravishankar, we agree that most likely is tumor lesion. Bone metastasis is a less common in colon cancer,  but possible, or there is a 2nd primary process. Regardless, she is not candidate of cancer treatment and I do not think additional biopsy will be beneficial. Palliative radiation could be considered  if pain is not  controlled.    Earlie Server 04/21/2022

## 2022-04-21 NOTE — Progress Notes (Signed)
Physical Therapy Treatment Patient Details Name: April [name] MRN: [id number] DOB: Dec 07, 1945 Today's Date: 04/21/2022   History of Present Illness Pt.  is an 76 y.o. 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    PT Comments    Patient alert, initially very hesitant and not agreeable to PT. With time and education, pt did perform supine BLE exercises, AAROM as needed. With further encouragement, did agree to sit at EOB. MaxAx2, but the pt did initiate by moving her legs of the bed, most assistance needed for trunk elevation. Pt immediately yelling out in pain and attempting to return to supine. Repositioned in midline and pt able to momentarily progress from mod-maxA to maintain sitting balance to CGA. Poor tolerance today due to pt complaints of back pain. Returned to supine maxAx2 and repositioned with all needs in reach, extended time. The patient would benefit from further skilled PT intervention to continue to progress towards goals. Recommendation remains appropriate.     Recommendations for follow up therapy are one component of a multi-disciplinary discharge planning process, led by the attending physician.  Recommendations may be updated based on patient status, additional functional criteria and insurance authorization.  Follow Up Recommendations  Skilled nursing-short term rehab (<3 hours/day) Can patient physically be transported by private vehicle: No   Assistance Recommended at Discharge Frequent or constant Supervision/Assistance  Patient can return home with the following Two people to help with walking and/or transfers;A lot of help with bathing/dressing/bathroom;Direct supervision/assist for medications management;Direct supervision/assist for financial management;Assist for transportation;Help with  stairs or ramp for entrance   Equipment Recommendations  Other (comment) (TBD)    Recommendations for Other Services       Precautions / Restrictions Precautions Precautions: Shoulder;Fall Type of Shoulder Precautions: Per Previous encounter: Pt 2 months out humeral fracture. Per Dr. Sharlet Salina, allowed gentle strengthening and shoulder mobility. Sling no longer required, no WB restrictions Precaution Booklet Issued: No Restrictions Weight Bearing Restrictions: Yes RUE Weight Bearing: Weight bearing as tolerated     Mobility  Bed Mobility Overal bed mobility: Needs Assistance Bed Mobility: Supine to Sit, Sit to Supine     Supine to sit: Max assist, +2 for physical assistance, +2 for safety/equipment Sit to supine: Max assist, HOB elevated, +2 for physical assistance        Transfers                   General transfer comment: Deferred    Ambulation/Gait                   Stairs             Wheelchair Mobility    Modified Rankin (Stroke Patients Only)       Balance Overall balance assessment: Needs assistance Sitting-balance support: Feet supported, Bilateral upper extremity supported Sitting balance-Leahy Scale: Poor Sitting balance - Comments: pt yelling out in pain throughout difficult to redirect. mostly modA-maxA to maintain sitting, 15 seconds of sitting with CGA only                                    Cognition Arousal/Alertness: Awake/alert Behavior During Therapy: WFL for tasks assessed/performed Overall Cognitive Status: No family/caregiver present to determine baseline cognitive functioning  Exercises General Exercises - Lower Extremity Ankle Circles/Pumps: AROM, Both, 10 reps Short Arc Quad: AROM, Strengthening, Both, 20 reps Heel Slides: AAROM, Strengthening, Both, Supine, 20 reps Hip ABduction/ADduction: AAROM, Strengthening, Both, Supine, 20  reps    General Comments        Pertinent Vitals/Pain Pain Assessment Pain Assessment: Faces Faces Pain Scale: Hurts even more Pain Location: stomach, RUE, back Pain Descriptors / Indicators: Grimacing, Moaning Pain Intervention(s): Limited activity within patient's tolerance, Monitored during session, Repositioned    Home Living                          Prior Function            PT Goals (current goals can now be found in the care plan section) Progress towards PT goals: Progressing toward goals (limited due to pt pain, compliance)    Frequency    Min 2X/week      PT Plan Current plan remains appropriate    Co-evaluation              AM-PAC PT "6 Clicks" Mobility   Outcome Measure  Help needed turning from your back to your side while in a flat bed without using bedrails?: A Lot Help needed moving from lying on your back to sitting on the side of a flat bed without using bedrails?: A Lot Help needed moving to and from a bed to a chair (including a wheelchair)?: Total Help needed standing up from a chair using your arms (e.g., wheelchair or bedside chair)?: Total Help needed to walk in hospital room?: Total Help needed climbing 3-5 steps with a railing? : Total 6 Click Score: 8    End of Session Equipment Utilized During Treatment: Oxygen Activity Tolerance: Patient limited by pain;Patient limited by fatigue Patient left: in bed;with call bell/phone within reach;with bed alarm set Nurse Communication: Mobility status PT Visit Diagnosis: History of falling (Z91.81);Muscle weakness (generalized) (M62.81);Difficulty in walking, not elsewhere classified (R26.2);Pain Pain - Right/Left:  (midline) Pain - part of body:  (back)     Time: 9242-6834 PT Time Calculation (min) (ACUTE ONLY): 29 min  Charges:  $Therapeutic Exercise: 8-22 mins $Therapeutic Activity: 8-22 mins                     Lieutenant Diego PT, DPT 10:07 AM,04/21/22

## 2022-04-21 NOTE — Plan of Care (Signed)

## 2022-04-21 NOTE — Progress Notes (Signed)
Date of Admission:  04/15/2022     ID: April [name] is a 76 y.o. 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)   Palliative care encounter   Multiple lesions of metastatic malignancy (HCC)    Subjective: Some shortness of breath Some confusion  Medications:   amLODipine  5 mg Oral Daily   bisacodyl  10 mg Rectal Once   chlorhexidine  10 mL Mouth/Throat QID   docusate sodium  100 mg Oral BID   empagliflozin  10 mg Oral Daily   enoxaparin (LOVENOX) injection  0.5 mg/kg Subcutaneous Q24H   gabapentin  300 mg Oral TID   insulin aspart  0-15 Units Subcutaneous TID WC   insulin aspart  0-5 Units Subcutaneous QHS   metFORMIN  500 mg Oral BID   metoprolol tartrate  25 mg Oral BID   polyethylene glycol  17 g Oral BID   pravastatin  40 mg Oral Daily    Objective: Vital signs in last 24 hours: Temp:  [98 F (36.7 C)-98.5 F (36.9 C)] 98.1 F (36.7 C) (07/27 1155) Pulse Rate:  [85-100] 94 (07/27 1155) Resp:  [17-20] 17 (07/27 1155) BP: (94-131)/(51-78) 122/62 (07/27 1155) SpO2:  [92 %-96 %] 92 % (07/27 1155)  PHYSICAL EXAM:  General: awake and laert, some confusion, hard of hearing Tachypnea  Head: Normocephalic, without obvious abnormality, atraumatic. Eyes: Conjunctivae clear, anicteric sclerae. Pupils are equal Oral cavity- mass rt side of the madible inside the mouth  Neck: Supple,  adenopathy, thyroid: non tender no carotid bruit and no JVD.  Lungs: b/l air entry Heart: Regular rate  and rhythm, no murmur, rub or gallop. Abdomen: Soft, non-tender,not distended. Bowel sounds normal. No masses Extremities: atraumatic, no cyanosis. No edema. No clubbing Skin: No rashes or lesions. Or bruising Lymph: Cervical, supraclavicular normal. Neurologic: Grossly non-focal  Lab Results Recent Labs    04/20/22 1545 04/21/22 1135  WBC  --  22.9*  HGB  --  12.8  HCT  --  41.7  NA 138  --   K 3.1* 2.9*  CL 97*  --   CO2 31  --   BUN 9  --   CREATININE 0.38*  --    Liver Panel No results for input(s): "PROT", "ALBUMIN", "AST", "ALT", "ALKPHOS", "BILITOT", "BILIDIR", "IBILI" in the last 72 hours. Sedimentation Rate No results for input(s): "ESRSEDRATE" in the last 72 hours. C-Reactive Protein No results for input(s): "CRP" in the last 72 hours.  Microbiology:  Studies/Results: DG Chest Port 1 View  Result Date: 04/20/2022 CLINICAL DATA:  Fever, altered mental status EXAM: PORTABLE CHEST 1 VIEW COMPARISON:  Previous studies including the examination of 04/02/2022 FINDINGS: Cardiac size is within normal limits. Thoracic aorta is tortuous and ectatic. Central pulmonary vessels are prominent. Increased interstitial markings and patchy alveolar densities are seen in the parahilar regions and lower lung fields. Lateral CP angles are clear. There is no pneumothorax. Degenerative changes are noted in both shoulders. IMPRESSION: Central pulmonary vessels are prominent.  Increased interstitial and alveolar markings in the parahilar regions and lower lung fields may suggest pulmonary edema and possibly multifocal pneumonia. Electronically Signed   By: Elmer Picker M.D.   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

## 2022-04-21 NOTE — Progress Notes (Signed)
PROGRESS NOTE    April [name]  SPQ:[id number] DOB: Apr 05, 1946 DOA: 04/15/2022 PCP: Albina Billet, MD    Brief Narrative:  April [name] is a 76 y.o. female with medical history significant for Type II DM, HTN, PVD, recently hospitalized from 7/8 to 7/16 for frequent falls and discharged to SNF, who at that time had chronic leukocytosis of unknown etiology but found to have Klebsiella UTI for which she was treated and who at the same time had a known lung mass seen on CT 01/30/2022 for which she declined treatment or further investigation, evaluated by palliative care prior to discharge, who now returns to the ED with a complaint of abdominal pain, constipation, and pain all over, including pain on her right jaw and ' something going on with her tooth'.  She states her last bowel movement was 2 to 3 weeks prior and she has pain with urination. ED course and data review: In the ED intermittently tachycardic to 101.  O2 sat intermittently 90-94 for which she was placed on nasal cannula 2 to 3 L.  Vitals otherwise unremarkable Labs notable for WBC of 27,000 with lactic acid 1.3, up from an high baseline of 19-21,000 during her hospitalization a couple weeks prior.  Platelets 440 and alk phos 561.  The emergency provider spoke with ENT specialist, Dr. Tami Ribas who advised that infection on mandible is possibly related to underlying malignancy so any surgical intervention would likely worsen outcome and thus recommended antibiotic therapy only.  Recommended oral Augmentin, according to discussion with ED provider Oncology was consulted and they recommended ultrasound-guided liver biopsy which was done.  Now we are just waiting for pathology to come back so we can decide what options she has.  She will require SNF placement  7/22 no overnight issues. Oncology was consulted 7/23 plan for ultrasound-guided liver biopsy tomorrow. PT recommends SNF 7/25 s/p liver bx today 7/26 Tmax 100.  WBC up  Consultants:   Oncology , ENT  Procedures:  CT a/p There is 5.7 cm mass lesion in sigmoid colon suggesting possible primary malignant neoplasm.  There is no evidence of intestinal obstruction or pneumoperitoneum. There is no hydronephrosis.  There are multiple nodules of varying sizes in the visualized lower lung fields consistent with pulmonary metastatic disease. Patchy infiltrates in the lower lung fields may suggest atelectasis/pneumonia. Small bilateral pleural effusions.  There are multiple space-occupying lesions of varying sizes in the liver measuring up to 7.5 cm in size suggesting hepatic metastatic disease.  There are few nodular densities in both adrenals suggesting possible metastatic disease.  There is inhomogeneous attenuation in the bony structures suggesting possible skeletal metastatic disease. There is a lytic lesion in the posterior medial aspect of left iliac crest consistent with skeletal metastatic disease.  Other findings as described in the body of the report.  CT max Expanded right mandibular body with surrounding soft tissue and multifocal cortical destruction, likely indicating active osteomyelitis with an intra osseous necrotic or purulence collection. Clinical follow-up after resolution of symptoms is recommended to exclude a superimposed neoplastic process.     Antimicrobials:  Unasyn   Subjective: Thinks had bowel movement.  Objective: Vitals:   04/20/22 1603 04/20/22 1948 04/21/22 0342 04/21/22 0738  BP: (!) 108/53 129/63 131/78 131/69  Pulse: 100 98 97 85  Resp:  _0 Temp:  98 F (36.7 C) 98.5 F (36.9 C) 98.5 F (36.9 C)  TempSrc:      SpO2: 94% 96% 96% 94%  Weight:      Height:        Intake/Output Summary (Last 24 hours) at 04/21/2022 0808 Last data filed at 04/21/2022 4098 Gross per 24 hour  Intake 300 ml  Output 1350 ml  Net -1050 ml   Filed Weights   04/15/22 1441  Weight: 91.4 kg    Examination: Appears to be little  sob with talking Decrease bs Reg s1/s2 no gallop Soft benign +bs Trace edema Awake and alert Mood and affect appropriate in current setting     Data Reviewed: I have personally reviewed following labs and imaging studies  CBC: Recent Labs  Lab 04/15/22 1442 04/16/22 0402 04/17/22 1018  WBC 27.6* 28.4* 25.4*  HGB 14.4 14.0 14.1  HCT 46.2* 45.0 45.8  MCV 85.4 87.2 88.8  PLT 440* 457* 119*   Basic Metabolic Panel: Recent Labs  Lab 04/15/22 1442 04/16/22 0402 04/17/22 0451 04/18/22 0430 04/20/22 1545  NA 132* 136  --   --  138  K 3.5 4.3  --   --  3.1*  CL 96* 97*  --   --  97*  CO2 27 27  --   --  31  GLUCOSE 98 102*  --   --  121*  BUN 14 13  --   --  9  CREATININE 0.41* 0.50 0.49 0.47 0.38*  CALCIUM 9.0 9.2  --   --  8.6*   GFR: Estimated Creatinine Clearance: 69.2 mL/min (A) (by C-G formula based on SCr of 0.38 mg/dL (L)). Liver Function Tests: Recent Labs  Lab 04/15/22 1442  AST 48*  ALT 15  ALKPHOS 561*  BILITOT 1.2  PROT 6.9  ALBUMIN 2.4*   Recent Labs  Lab 04/15/22 1442  LIPASE 24   No results for input(s): "AMMONIA" in the last 168 hours. Coagulation Profile: Recent Labs  Lab 04/18/22 0430  INR 1.2   Cardiac Enzymes: No results for input(s): "CKTOTAL", "CKMB", "CKMBINDEX", "TROPONINI" in the last 168 hours. BNP (last 3 results) No results for input(s): "PROBNP" in the last 8760 hours. HbA1C: No results for input(s): "HGBA1C" in the last 72 hours. CBG: Recent Labs  Lab 04/20/22 0738 04/20/22 1145 04/20/22 1535 04/20/22 2117 04/21/22 0735  GLUCAP 117* 161* 116* 160* 149*   Lipid Profile: No results for input(s): "CHOL", "HDL", "LDLCALC", "TRIG", "CHOLHDL", "LDLDIRECT" in the last 72 hours. Thyroid Function Tests: No results for input(s): "TSH", "T4TOTAL", "FREET4", "T3FREE", "THYROIDAB" in the last 72 hours. Anemia Panel: No results for input(s): "VITAMINB12", "FOLATE", "FERRITIN", "TIBC", "IRON", "RETICCTPCT" in the last 72  hours. Sepsis Labs: Recent Labs  Lab 04/15/22 1911 04/17/22 0451  LATICACIDVEN 1.3 1.7    Recent Results (from the past 240 hour(s))  Blood culture (routine x 2)     Status: Abnormal   Collection Time: 04/15/22  7:11 PM   Specimen: BLOOD LEFT WRIST  Result Value Ref Range Status   Specimen Description   Final    BLOOD LEFT WRIST Performed at [facility], [address] [place], [place] [address]    Special Requests   Final    BOTTLES DRAWN AEROBIC AND ANAEROBIC Blood Culture adequate volume Performed at [facility], [address] [place], [place] [address]    Culture  Setup Time   Final    GRAM POSITIVE COCCI ANAEROBIC BOTTLE ONLY Organism ID to follow CRITICAL RESULT CALLED TO, READ BACK BY AND VERIFIED WITH: RODNEY GRUBB 04/16/22 1451 DE    Culture (A)  Final  STAPHYLOCOCCUS EPIDERMIDIS THE SIGNIFICANCE OF ISOLATING THIS ORGANISM FROM A SINGLE SET OF BLOOD CULTURES WHEN MULTIPLE SETS ARE DRAWN IS UNCERTAIN. PLEASE NOTIFY THE MICROBIOLOGY DEPARTMENT WITHIN ONE WEEK IF SPECIATION AND SENSITIVITIES ARE REQUIRED. Performed at [facility], [place] [address] [place], [place] [address]    Report Status 04/18/2022 FINAL  Final  Blood Culture ID Panel (Reflexed)     Status: Abnormal   Collection Time: 04/15/22  7:11 PM  Result Value Ref Range Status   Enterococcus faecalis NOT DETECTED NOT DETECTED Final   Enterococcus Faecium NOT DETECTED NOT DETECTED Final   Listeria monocytogenes NOT DETECTED NOT DETECTED Final   Staphylococcus species DETECTED (A) NOT DETECTED Final    Comment: CRITICAL RESULT CALLED TO, READ BACK BY AND VERIFIED WITH: RODNEY GRUBB 04/16/22 1451 DE    Staphylococcus aureus (BCID) NOT DETECTED NOT DETECTED Final   Staphylococcus epidermidis DETECTED (A) NOT DETECTED Final    Comment: Methicillin (oxacillin) resistant coagulase negative staphylococcus. Possible blood culture contaminant (unless isolated from more than one blood  culture draw or clinical case suggests pathogenicity). No antibiotic treatment is indicated for blood  culture contaminants. CRITICAL RESULT CALLED TO, READ BACK BY AND VERIFIED WITH: RODNEY GRUBB 04/16/22 1451 DE    Staphylococcus lugdunensis NOT DETECTED NOT DETECTED Final   Streptococcus species NOT DETECTED NOT DETECTED Final   Streptococcus agalactiae NOT DETECTED NOT DETECTED Final   Streptococcus pneumoniae NOT DETECTED NOT DETECTED Final   Streptococcus pyogenes NOT DETECTED NOT DETECTED Final   A.calcoaceticus-baumannii NOT DETECTED NOT DETECTED Final   Bacteroides fragilis NOT DETECTED NOT DETECTED Final   Enterobacterales NOT DETECTED NOT DETECTED Final   Enterobacter cloacae complex NOT DETECTED NOT DETECTED Final   Escherichia coli NOT DETECTED NOT DETECTED Final   Klebsiella aerogenes NOT DETECTED NOT DETECTED Final   Klebsiella oxytoca NOT DETECTED NOT DETECTED Final   Klebsiella pneumoniae NOT DETECTED NOT DETECTED Final   Proteus species NOT DETECTED NOT DETECTED Final   Salmonella species NOT DETECTED NOT DETECTED Final   Serratia marcescens NOT DETECTED NOT DETECTED Final   Haemophilus influenzae NOT DETECTED NOT DETECTED Final   Neisseria meningitidis NOT DETECTED NOT DETECTED Final   Pseudomonas aeruginosa NOT DETECTED NOT DETECTED Final   Stenotrophomonas maltophilia NOT DETECTED NOT DETECTED Final   Candida albicans NOT DETECTED NOT DETECTED Final   Candida auris NOT DETECTED NOT DETECTED Final   Candida glabrata NOT DETECTED NOT DETECTED Final   Candida krusei NOT DETECTED NOT DETECTED Final   Candida parapsilosis NOT DETECTED NOT DETECTED Final   Candida tropicalis NOT DETECTED NOT DETECTED Final   Cryptococcus neoformans/gattii NOT DETECTED NOT DETECTED Final   Methicillin resistance mecA/C DETECTED (A) NOT DETECTED Final    Comment: CRITICAL RESULT CALLED TO, READ BACK BY AND VERIFIED WITH: RODNEY GRUBB 04/16/22 1451 Performed at [facility],  [address] [place], [place] [address]   Culture, blood (Routine X 2) w Reflex to ID Panel     Status: None (Preliminary result)   Collection Time: 04/17/22  4:51 AM   Specimen: BLOOD  Result Value Ref Range Status   Specimen Description BLOOD LEFT ANTECUBITAL  Final   Special Requests   Final    BOTTLES DRAWN AEROBIC AND ANAEROBIC Blood Culture results may not be optimal due to an inadequate volume of blood received in culture bottles   Culture   Final    NO GROWTH 4 DAYS Performed at [facility], [address] [place],  [place] [address]    Report Status PENDING  Incomplete  Culture, blood (Routine X 2) w Reflex to ID Panel     Status: None (Preliminary result)   Collection Time: 04/20/22 12:40 PM   Specimen: BLOOD  Result Value Ref Range Status   Specimen Description BLOOD BLOOD LEFT FOREARM  Final   Special Requests   Final    BOTTLES DRAWN AEROBIC AND ANAEROBIC Blood Culture adequate volume   Culture   Final    NO GROWTH < 24 HOURS Performed at [facility], [address] [place], [place] [address]    Report Status PENDING  Incomplete  Culture, blood (Routine X 2) w Reflex to ID Panel     Status: None (Preliminary result)   Collection Time: 04/20/22 12:45 PM   Specimen: BLOOD  Result Value Ref Range Status   Specimen Description BLOOD BLOOD LEFT HAND  Final   Special Requests   Final    BOTTLES DRAWN AEROBIC AND ANAEROBIC Blood Culture adequate volume   Culture   Final    NO GROWTH < 24 HOURS Performed at [facility], [address], [place], [place] [address]    Report Status PENDING  Incomplete         Radiology Studies: DG Chest Port 1 View  Result Date: 04/20/2022 CLINICAL DATA:  Fever, altered mental status EXAM: PORTABLE CHEST 1 VIEW COMPARISON:  Previous studies including the examination of 04/02/2022 FINDINGS: Cardiac size is within normal limits. Thoracic aorta is tortuous and ectatic. Central pulmonary vessels are  prominent. Increased interstitial markings and patchy alveolar densities are seen in the parahilar regions and lower lung fields. Lateral CP angles are clear. There is no pneumothorax. Degenerative changes are noted in both shoulders. IMPRESSION: Central pulmonary vessels are prominent. Increased interstitial and alveolar markings in the parahilar regions and lower lung fields may suggest pulmonary edema and possibly multifocal pneumonia. Electronically Signed   By: Elmer Picker M.D.   On: 04/20/2022 13:47   US BIOPSY (LIVER)  Result Date: 04/19/2022 INDICATION: Hepatic masses including large left lobe hepatic mass, multiple pulmonary masses and sigmoid colonic mass. The patient presents for liver biopsy. EXAM: ULTRASOUND GUIDED CORE BIOPSY OF LIVER MEDICATIONS: None. ANESTHESIA/SEDATION: Moderate (conscious) sedation was employed during this procedure. A total of Fentanyl 50 mcg was administered intravenously by radiology nursing. Moderate Sedation Time: 15 minutes. The patient's level of consciousness and vital signs were monitored continuously by radiology nursing throughout the procedure under my direct supervision. PROCEDURE: The procedure, risks, benefits, and alternatives were explained to the patient. Questions regarding the procedure were encouraged and answered. The patient understands and consents to the procedure. A time-out was performed prior to initiating the procedure. The abdominal wall was prepped with chlorhexidine in a sterile fashion, and a sterile drape was applied covering the operative field. A sterile gown and sterile gloves were used for the procedure. Local anesthesia was provided with 1% Lidocaine. After localizing a left lobe liver mass by ultrasound, a 17 gauge trocar needle was advanced into the lesion under direct ultrasound guidance. Three separate coaxial 18 gauge core biopsy samples were obtained and submitted in formalin. Gel-Foam pledgets were advanced through the  outer needle as it was retracted and removed. Additional ultrasound was performed. COMPLICATIONS: None immediate. FINDINGS: Large hyperechoic oval-shaped mass in the left lobe of the liver measures up to 7 cm in greatest diameter by ultrasound. Solid tissue was obtained from the lesion. IMPRESSION: Ultrasound-guided core biopsy performed of a large  mass in the left lobe of the liver measuring up to approximately 7 cm in greatest diameter. Electronically Signed   By: Aletta Edouard M.D.   On: 04/19/2022 11:39        Scheduled Meds:  amLODipine  5 mg Oral Daily   bisacodyl  10 mg Rectal Once   chlorhexidine  10 mL Mouth/Throat QID   docusate sodium  100 mg Oral BID   empagliflozin  10 mg Oral Daily   enoxaparin (LOVENOX) injection  0.5 mg/kg Subcutaneous Q24H   gabapentin  300 mg Oral TID   insulin aspart  0-15 Units Subcutaneous TID WC   insulin aspart  0-5 Units Subcutaneous QHS   metFORMIN  500 mg Oral BID   metoprolol tartrate  25 mg Oral BID   polyethylene glycol  17 g Oral BID   pravastatin  40 mg Oral Daily   Continuous Infusions:  ampicillin-sulbactam (UNASYN) IV 3 g (04/21/22 0422)    Assessment & Plan:   Principal Problem:   Constipation Active Problems:   Colon cancer metastasized to liver, lung and bone (HCC)   Leukocytosis   Acute osteomyelitis of mandible   Type 2 diabetes mellitus (HCC)   PAD (peripheral artery disease) (HCC)   Essential hypertension   Chronic pain   Frailty syndrome in geriatric patient   Chronic respiratory failure with hypoxia ([place])   Osteomyelitis of mandible   Goals of care, counseling/discussion   Colonic mass   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 [facility])   Palliative care encounter   Multiple lesions of metastatic malignancy ([place])   Colon cancer metastasized to liver, lung and bone (HCC) Constipation, multifactorial Patient presents with constipation and abdominal pain, possibly related to sigmoid  cancer seen on CT though without evidence of obstruction Patient is on chronic narcotics so this could also be the etiology of her constipation Stool softeners, laxatives and enema if needed Patient desires no intervention or further diagnostic evaluation Was seen by palliative care on 7/16 prior to her recent discharge Oncology consulted input appreciated.  Recommend ultrasound-guided liver biopsy for tissue diagnosis and then go from there.   status post liver biopsy on 7/25 by IR  Oncology CEA elevated in the 500s, possible metastatic colon cancer Her options include chemo versus comfort care/hospice.  Her performance status, lack of family support or her barriers.  She is undecided and would like to wait for biopsy to come back 7/27 pathology pending   Leukocytosis Had low-grade fever 7/27 chest x-ray edema versus infection She is on Unasyn we will continue She is mildly short of breath we will recheck CBC today Little volume bloated we will give Lasix 20 mg x 1 ID consulted   Acute osteomyelitis of mandible Possible metastatic lesion right mandible Patient has right-sided jaw pain and swelling with leukocytosis of 27,000 and abnormal CT findings CT shows: Expanded right mandibular body with surrounding soft tissue and multifocal cortical destruction, likely indicating active osteomyelitis with an intra osseous necrotic or purulence collection Continue Unasyn and transition to Augmentin for discharge ENT consult to follow, though based on conversation with ED provider no surgical intervention recommended due to likely malignant and infectious component Pain control Continue unasyn. Spoke to ID Dr. Linus Salmons on call, recommended on discharge 4 weeks of augmentin with f/u with ENT or ID. 7/23 ENT input appreciated. Neoplasm uncertain behavior mandible with 2/2 infection. No acute abscess per ENT. No drainage , likely some dead necrotic tissue with 2/2ary infection.  Peridex mouth rinse   ?ENT bx possibly if needed by oncology 7/27 continue IV antibiotics        Chronic respiratory failure with Hypoxia Was discharged previously on 2L on 7/16.  Likely from lung mets and possibly underlying OSA 7/27 we will give Lasix 20 mg x 1  Keep O2 sats above 92%            Frailty syndrome in geriatric patient Patient with a history of recent frequent falls resulting in injury, untreated metastatic cancer, recently hospitalized and discharged to SNF Does not have much appetite  PT recommends SNF      Essential HTN Continue amlodipine and beta blk Chronic pain Continue gabapentin and acetaminophen   PAD (peripheral artery disease) (HCC) Continue pravastatin   Type 2 diabetes mellitus ([place]) Continue Jardiance and metformin and sliding scale insulin coverage         DVT prophylaxis: lovenox Code Status:full Family Communication: none Disposition Plan: SNF Status is: inpatient The patient remains inpatient due to need of pain mx, w/u pending , and iv treatment        LOS: 5 days   Time spent: 35 min    Nolberto Hanlon, MD [facility] Pager [phone]  If 7PM-7AM, please contact amion

## 2022-04-21 NOTE — Care Management Important Message (Signed)
Important Message  Patient Details  Name: April [name] MRN: [id number] Date of Birth: [birth year]   Medicare Important Message Given:  Yes     [name] 04/21/2022, 11:45 AM

## 2022-04-22 DIAGNOSIS — E876 Hypokalemia: Secondary | ICD-10-CM

## 2022-04-22 DIAGNOSIS — K5900 Constipation, unspecified: Secondary | ICD-10-CM | POA: Diagnosis not present

## 2022-04-22 DIAGNOSIS — Z515 Encounter for palliative care: Secondary | ICD-10-CM | POA: Diagnosis not present

## 2022-04-22 DIAGNOSIS — J9611 Chronic respiratory failure with hypoxia: Secondary | ICD-10-CM | POA: Diagnosis not present

## 2022-04-22 DIAGNOSIS — G8929 Other chronic pain: Secondary | ICD-10-CM | POA: Diagnosis not present

## 2022-04-22 DIAGNOSIS — M272 Inflammatory conditions of jaws: Secondary | ICD-10-CM | POA: Diagnosis not present

## 2022-04-22 LAB — CULTURE, BLOOD (ROUTINE X 2): Culture: NO GROWTH

## 2022-04-22 LAB — GLUCOSE, CAPILLARY
Glucose-Capillary: 145 mg/dL — ABNORMAL HIGH (ref 70–99)
Glucose-Capillary: 116 mg/dL — ABNORMAL HIGH (ref 70–99)
Glucose-Capillary: 98 mg/dL (ref 70–99)
Glucose-Capillary: 148 mg/dL — ABNORMAL HIGH (ref 70–99)

## 2022-04-22 LAB — POTASSIUM: Potassium: 2.8 mmol/L — ABNORMAL LOW (ref 3.5–5.1)

## 2022-04-22 MED ORDER — POTASSIUM CHLORIDE CRYS ER 20 MEQ PO TBCR
40.0000 meq | EXTENDED_RELEASE_TABLET | ORAL | Status: AC
  Administered 2022-04-22 (×2): 40 meq via ORAL
  Filled 2022-04-22 (×2): qty 2

## 2022-04-22 NOTE — TOC Progression Note (Signed)
Transition of Care [facility]) - Progression Note    Patient Details  Name: April [name] MRN: [id number] Date of Birth: 1946-05-31  Transition of Care [facility]) CM/SW [place], RN Phone Number: 04/22/2022, 11:06 AM  Clinical Narrative:    Spoke with the patient and her friend Jewel, We discussed bed options, we also discussed the possibility of Hospice, the patient is wanting Jewel to speak for her, however the patient has still not completed the [facility] Paper work or living will I encouraged her to complete this and explained if she were not able to speak for herself that without these papers completed, Jewel would not be able to speak for her,  , patient is agreeable for me to come back later to discuss Bed options        Expected Discharge Plan and Services           Expected Discharge Date: 04/17/22                                     Social Determinants of Health (SDOH) Interventions    Readmission Risk Interventions    04/04/2022    1:53 PM  Readmission Risk Prevention Plan  Post Dischage Appt Complete  Medication Screening Complete  Transportation Screening Complete

## 2022-04-22 NOTE — Plan of Care (Signed)

## 2022-04-22 NOTE — Progress Notes (Signed)
Pharmacy Antibiotic Note  April [name] is a 76 y.o. female w/ PMH of DM, HTN, PVD recently hospitalized from 7/8 to 7/16 for frequent falls and discharged to SNF, Klebsiella UTI, CRC metastasized to liver, lung, bone  admitted on 04/15/2022 with  jaw infection .  Pharmacy has been consulted for unasyn dosing.  Plan:   Day 7- Unasyn 3 grams IV every 6 hours. Plan to d/c on augmentin.    Height: 5\' 6"  (167.6 cm) Weight: 91.4 kg (201 lb 8 oz) IBW/kg (Calculated) : 59.3  Temp (24hrs), Avg:98.4 F (36.9 C), Min:97.9 F (36.6 C), Max:98.8 F (37.1 C)  Recent Labs  Lab 04/15/22 1442 04/15/22 1911 04/16/22 0402 04/17/22 0451 04/17/22 1018 04/18/22 0430 04/20/22 1545 04/21/22 1135  WBC 27.6*  --  28.4*  --  25.4*  --   --  22.9*  CREATININE 0.41*  --  0.50 0.49  --  0.47 0.38*  --   LATICACIDVEN  --  1.3  --  1.7  --   --   --   --      Estimated Creatinine Clearance: 69.2 mL/min (A) (by C-G formula based on SCr of 0.38 mg/dL (L)).    Allergies  Allergen Reactions   Prednisone Anaphylaxis   Other     Antimicrobials this admission: Unasyn 7/21  (evening) >>   vancomycin  7/22 >> 7/24  Microbiology results: 7/21 BCx: 1/4 S epidermidis MecA/C+  Thank you for allowing pharmacy to be a part of this patient's care.  Oswald Hillock, PharmD, BCPS 04/22/2022 9:19 AM

## 2022-04-22 NOTE — Progress Notes (Signed)
PROGRESS NOTE    April [name]  UDJ:[id number] DOB: Dec 03, 1945 DOA: 04/15/2022 PCP: Albina Billet, MD    Brief Narrative:  April [name] is a 76 y.o. female with medical history significant for Type II DM, HTN, PVD, recently hospitalized from 7/8 to 7/16 for frequent falls and discharged to SNF, who at that time had chronic leukocytosis of unknown etiology but found to have Klebsiella UTI for which she was treated and who at the same time had a known lung mass seen on CT 01/30/2022 for which she declined treatment or further investigation, evaluated by palliative care prior to discharge, who now returns to the ED with a complaint of abdominal pain, constipation, and pain all over, including pain on her right jaw and ' something going on with her tooth'.    ENT was consulted.  Oncology also was consulted they recommended ultrasound-guided liver biopsy which was done on 7/25.  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  She does not have much option for treatment.  Palliative was called and patient is deciding whether she wants hospice or not.       Consultants:  Oncology , ENT, palliative  Procedures:  CT a/p There is 5.7 cm mass lesion in sigmoid colon suggesting possible primary malignant neoplasm.  There is no evidence of intestinal obstruction or pneumoperitoneum. There is no hydronephrosis.  There are multiple nodules of varying sizes in the visualized lower lung fields consistent with pulmonary metastatic disease. Patchy infiltrates in the lower lung fields may suggest atelectasis/pneumonia. Small bilateral pleural effusions.  There are multiple space-occupying lesions of varying sizes in the liver measuring up to 7.5 cm in size suggesting hepatic metastatic disease.  There are few nodular densities in both adrenals suggesting possible metastatic disease.  There is inhomogeneous attenuation in the bony structures suggesting possible skeletal metastatic disease. There is a lytic lesion  in the posterior medial aspect of left iliac crest consistent with skeletal metastatic disease.  Other findings as described in the body of the report.  CT max Expanded right mandibular body with surrounding soft tissue and multifocal cortical destruction, likely indicating active osteomyelitis with an intra osseous necrotic or purulence collection. Clinical follow-up after resolution of symptoms is recommended to exclude a superimposed neoplastic process.     Antimicrobials:  Unasyn   Subjective: She has no complaints this am. Did not eat much today as she gets full easily  Objective: Vitals:   04/21/22 2016 04/22/22 0027 04/22/22 0442 04/22/22 0830  BP: 136/87 133/72 (!) 107/56 130/69  Pulse: (!) 110 (!) 105 (!) 101 (!) 108  Resp: 15 15 14 16   Temp: 98.7 F (37.1 C) 98.8 F (37.1 C) 98 F (36.7 C) 98.7 F (37.1 C)  TempSrc:      SpO2: 91% 93% 96% 90%  Weight:      Height:        Intake/Output Summary (Last 24 hours) at 04/22/2022 1240 Last data filed at 04/22/2022 0548 Gross per 24 hour  Intake 200 ml  Output 700 ml  Net -500 ml   Filed Weights   04/15/22 1441  Weight: 91.4 kg    Examination: Calm, NAD Cta no w/r anteriorly Reg s1/s2 no gallop Soft benign +bs No edema Awake and alert Mood and affect appropriate in current setting     Data Reviewed: I have personally reviewed following labs and imaging studies  CBC: Recent Labs  Lab 04/15/22 1442 04/16/22 0402 04/17/22 1018 04/21/22 1135  WBC 27.6* 28.4* 25.4* 22.9*  HGB 14.4 14.0 14.1 12.8  HCT 46.2* 45.0 45.8 41.7  MCV 85.4 87.2 88.8 89.1  PLT 440* 457* 426* 132   Basic Metabolic Panel: Recent Labs  Lab 04/15/22 1442 04/16/22 0402 04/17/22 0451 04/18/22 0430 04/20/22 1545 04/21/22 1135 04/22/22 0535  NA 132* 136  --   --  138  --   --   K 3.5 4.3  --   --  3.1* 2.9* 2.8*  CL 96* 97*  --   --  97*  --   --   CO2 27 27  --   --  31  --   --   GLUCOSE 98 102*  --   --  121*  --    --   BUN 14 13  --   --  9  --   --   CREATININE 0.41* 0.50 0.49 0.47 0.38*  --   --   CALCIUM 9.0 9.2  --   --  8.6*  --   --    GFR: Estimated Creatinine Clearance: 69.2 mL/min (A) (by C-G formula based on SCr of 0.38 mg/dL (L)). Liver Function Tests: Recent Labs  Lab 04/15/22 1442  AST 48*  ALT 15  ALKPHOS 561*  BILITOT 1.2  PROT 6.9  ALBUMIN 2.4*   Recent Labs  Lab 04/15/22 1442  LIPASE 24   No results for input(s): "AMMONIA" in the last 168 hours. Coagulation Profile: Recent Labs  Lab 04/18/22 0430  INR 1.2   Cardiac Enzymes: No results for input(s): "CKTOTAL", "CKMB", "CKMBINDEX", "TROPONINI" in the last 168 hours. BNP (last 3 results) No results for input(s): "PROBNP" in the last 8760 hours. HbA1C: No results for input(s): "HGBA1C" in the last 72 hours. CBG: Recent Labs  Lab 04/21/22 1508 04/21/22 1742 04/21/22 2025 04/22/22 0727 04/22/22 1153  GLUCAP 96 103* 124* 145* 116*   Lipid Profile: No results for input(s): "CHOL", "HDL", "LDLCALC", "TRIG", "CHOLHDL", "LDLDIRECT" in the last 72 hours. Thyroid Function Tests: No results for input(s): "TSH", "T4TOTAL", "FREET4", "T3FREE", "THYROIDAB" in the last 72 hours. Anemia Panel: No results for input(s): "VITAMINB12", "FOLATE", "FERRITIN", "TIBC", "IRON", "RETICCTPCT" in the last 72 hours. Sepsis Labs: Recent Labs  Lab 04/15/22 1911 04/17/22 0451  LATICACIDVEN 1.3 1.7    Recent Results (from the past 240 hour(s))  Blood culture (routine x 2)     Status: Abnormal   Collection Time: 04/15/22  7:11 PM   Specimen: BLOOD LEFT WRIST  Result Value Ref Range Status   Specimen Description   Final    BLOOD LEFT WRIST Performed at [facility], [address] [place], [place] [address]    Special Requests   Final    BOTTLES DRAWN AEROBIC AND ANAEROBIC Blood Culture adequate volume Performed at [facility], [address] [place], [place] [address]    Culture  Setup Time   Final     GRAM POSITIVE COCCI ANAEROBIC BOTTLE ONLY Organism ID to follow CRITICAL RESULT CALLED TO, READ BACK BY AND VERIFIED WITH: RODNEY GRUBB 04/16/22 1451 DE    Culture (A)  Final    STAPHYLOCOCCUS EPIDERMIDIS THE SIGNIFICANCE OF ISOLATING THIS ORGANISM FROM A SINGLE SET OF BLOOD CULTURES WHEN MULTIPLE SETS ARE DRAWN IS UNCERTAIN. PLEASE NOTIFY THE MICROBIOLOGY DEPARTMENT WITHIN ONE WEEK IF SPECIATION AND SENSITIVITIES ARE REQUIRED. Performed at [facility], [place] [address], [place], [place] [address]    Report Status 04/18/2022 FINAL  Final  Blood Culture ID Panel (Reflexed)  Status: Abnormal   Collection Time: 04/15/22  7:11 PM  Result Value Ref Range Status   Enterococcus faecalis NOT DETECTED NOT DETECTED Final   Enterococcus Faecium NOT DETECTED NOT DETECTED Final   Listeria monocytogenes NOT DETECTED NOT DETECTED Final   Staphylococcus species DETECTED (A) NOT DETECTED Final    Comment: CRITICAL RESULT CALLED TO, READ BACK BY AND VERIFIED WITH: RODNEY GRUBB 04/16/22 1451 DE    Staphylococcus aureus (BCID) NOT DETECTED NOT DETECTED Final   Staphylococcus epidermidis DETECTED (A) NOT DETECTED Final    Comment: Methicillin (oxacillin) resistant coagulase negative staphylococcus. Possible blood culture contaminant (unless isolated from more than one blood culture draw or clinical case suggests pathogenicity). No antibiotic treatment is indicated for blood  culture contaminants. CRITICAL RESULT CALLED TO, READ BACK BY AND VERIFIED WITH: RODNEY GRUBB 04/16/22 1451 DE    Staphylococcus lugdunensis NOT DETECTED NOT DETECTED Final   Streptococcus species NOT DETECTED NOT DETECTED Final   Streptococcus agalactiae NOT DETECTED NOT DETECTED Final   Streptococcus pneumoniae NOT DETECTED NOT DETECTED Final   Streptococcus pyogenes NOT DETECTED NOT DETECTED Final   A.calcoaceticus-baumannii NOT DETECTED NOT DETECTED Final   Bacteroides fragilis NOT DETECTED NOT DETECTED Final    Enterobacterales NOT DETECTED NOT DETECTED Final   Enterobacter cloacae complex NOT DETECTED NOT DETECTED Final   Escherichia coli NOT DETECTED NOT DETECTED Final   Klebsiella aerogenes NOT DETECTED NOT DETECTED Final   Klebsiella oxytoca NOT DETECTED NOT DETECTED Final   Klebsiella pneumoniae NOT DETECTED NOT DETECTED Final   Proteus species NOT DETECTED NOT DETECTED Final   Salmonella species NOT DETECTED NOT DETECTED Final   Serratia marcescens NOT DETECTED NOT DETECTED Final   Haemophilus influenzae NOT DETECTED NOT DETECTED Final   Neisseria meningitidis NOT DETECTED NOT DETECTED Final   Pseudomonas aeruginosa NOT DETECTED NOT DETECTED Final   Stenotrophomonas maltophilia NOT DETECTED NOT DETECTED Final   Candida albicans NOT DETECTED NOT DETECTED Final   Candida auris NOT DETECTED NOT DETECTED Final   Candida glabrata NOT DETECTED NOT DETECTED Final   Candida krusei NOT DETECTED NOT DETECTED Final   Candida parapsilosis NOT DETECTED NOT DETECTED Final   Candida tropicalis NOT DETECTED NOT DETECTED Final   Cryptococcus neoformans/gattii NOT DETECTED NOT DETECTED Final   Methicillin resistance mecA/C DETECTED (A) NOT DETECTED Final    Comment: CRITICAL RESULT CALLED TO, READ BACK BY AND VERIFIED WITH: RODNEY GRUBB 04/16/22 1451 Performed at [facility], [address] [place], [place] [address]   Culture, blood (Routine X 2) w Reflex to ID Panel     Status: None   Collection Time: 04/17/22  4:51 AM   Specimen: BLOOD  Result Value Ref Range Status   Specimen Description BLOOD LEFT ANTECUBITAL  Final   Special Requests   Final    BOTTLES DRAWN AEROBIC AND ANAEROBIC Blood Culture results may not be optimal due to an inadequate volume of blood received in culture bottles   Culture   Final    NO GROWTH 5 DAYS Performed at [facility], [address] [place], [place] [address]    Report Status 04/22/2022 FINAL  Final  Culture, blood (Routine X 2) w Reflex to  ID Panel     Status: None (Preliminary result)   Collection Time: 04/20/22 12:40 PM   Specimen: BLOOD  Result Value Ref Range Status   Specimen Description BLOOD BLOOD LEFT FOREARM  Final   Special Requests   Final    BOTTLES DRAWN AEROBIC  AND ANAEROBIC Blood Culture adequate volume   Culture   Final    NO GROWTH 2 DAYS Performed at [facility], [address] [place], [place] [address]    Report Status PENDING  Incomplete  Culture, blood (Routine X 2) w Reflex to ID Panel     Status: None (Preliminary result)   Collection Time: 04/20/22 12:45 PM   Specimen: BLOOD  Result Value Ref Range Status   Specimen Description BLOOD BLOOD LEFT HAND  Final   Special Requests   Final    BOTTLES DRAWN AEROBIC AND ANAEROBIC Blood Culture adequate volume   Culture   Final    NO GROWTH 2 DAYS Performed at [facility], [address] [place], [place] [address]    Report Status PENDING  Incomplete         Radiology Studies: DG Chest Port 1 View  Result Date: 04/20/2022 CLINICAL DATA:  Fever, altered mental status EXAM: PORTABLE CHEST 1 VIEW COMPARISON:  Previous studies including the examination of 04/02/2022 FINDINGS: Cardiac size is within normal limits. Thoracic aorta is tortuous and ectatic. Central pulmonary vessels are prominent. Increased interstitial markings and patchy alveolar densities are seen in the parahilar regions and lower lung fields. Lateral CP angles are clear. There is no pneumothorax. Degenerative changes are noted in both shoulders. IMPRESSION: Central pulmonary vessels are prominent. Increased interstitial and alveolar markings in the parahilar regions and lower lung fields may suggest pulmonary edema and possibly multifocal pneumonia. Electronically Signed   By: Elmer Picker M.D.   On: 04/20/2022 13:47        Scheduled Meds:  amLODipine  5 mg Oral Daily   chlorhexidine  10 mL Mouth/Throat QID   docusate sodium  100 mg Oral BID    empagliflozin  10 mg Oral Daily   enoxaparin (LOVENOX) injection  0.5 mg/kg Subcutaneous Q24H   gabapentin  300 mg Oral TID   insulin aspart  0-15 Units Subcutaneous TID WC   insulin aspart  0-5 Units Subcutaneous QHS   metFORMIN  500 mg Oral BID   metoprolol tartrate  25 mg Oral BID   polyethylene glycol  17 g Oral BID   potassium chloride  40 mEq Oral Q4H   pravastatin  40 mg Oral Daily   Continuous Infusions:  ampicillin-sulbactam (UNASYN) IV 3 g (04/22/22 0940)    Assessment & Plan:   Principal Problem:   Constipation Active Problems:   Colon cancer metastasized to liver, lung and bone (HCC)   Leukocytosis   Acute osteomyelitis of mandible   Type 2 diabetes mellitus (HCC)   PAD (peripheral artery disease) (HCC)   Essential hypertension   Chronic pain   Frailty syndrome in geriatric patient   Chronic respiratory failure with hypoxia ([place])   Osteomyelitis of mandible   Goals of care, counseling/discussion   Colonic mass   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  Recommend ultrasound-guided liver biopsy for tissue diagnosis   status post liver biopsy on 7/25 by IR  CEA elevated in the 500s 7/28 biopsy/path results positive.  Patient  has metastatic stage IV colon cancer.  Condition is not curable and her prognosis is poor. Patient has very poor performance status, widespread disease, lack of family support.  Oncology does not think she is a candidate for chemotherapy.  Recommended hospice comfort  care. Patient to decide whether she wants to go comfort and she would like to talk to her friend about it. Hospice/palliative care were consulted and following.    Leukocytosis Had low-grade fever chest x-ray edema versus infection ID was consulted.   7/28 continue Unasyn (which was started for her right mandible on admission) for now   Acute osteomyelitis of mandible Possible metastatic lesion right mandible Patient has right-sided jaw pain and swelling with leukocytosis of 27,000 and abnormal CT findings CT shows: Expanded right mandibular body with surrounding soft tissue and multifocal cortical destruction, likely indicating active osteomyelitis with an intra osseous necrotic or purulence collection Continue Unasyn and transition to Augmentin for discharge ENT consult to follow, though based on conversation with ED provider no surgical intervention recommended due to likely malignant and infectious component Pain control Continue unasyn. Spoke to ID Dr. Linus Salmons on call, recommended on discharge 4 weeks of augmentin with f/u with ENT or ID. 7/23 ENT input appreciated. Neoplasm uncertain behavior mandible with secondary infection. No acute abscess per ENT. No drainage , likely some dead necrotic tissue with secondary infection Peridex mouth rinse  7/28 no additional bx beneficial.  Per oncology bone metastasis is less common in colon cancer, but possible, or this is a second primary process. Most likely this is a tumor lesion We will continue Unasyn for now until above        Chronic respiratory failure with Hypoxia Was discharged previously on 2L on 7/16.  Likely from lung mets and possibly underlying OSA 7/28 given Lasix 20 mg x 1 yesterday Keep O2 sats above 92%    Hypokalemia We will replace Monitor        Frailty syndrome in geriatric patient Patient with a history of recent frequent falls resulting in injury, untreated metastatic cancer, recently hospitalized  and discharged to SNF Does not have much appetite  PT recommends SNF  Hospice discussion with patient about care     Essential HTN Continue amlodipine and beta blk  Chronic pain Continue gabapentin and acetaminophen   PAD (peripheral artery disease) (HCC) Continue pravastatin   Type 2 diabetes mellitus ([place]) Continue Jardiance and metformin and sliding scale insulin coverage         DVT prophylaxis: lovenox Code Status:full Family Communication: none Disposition Plan: SNF Status is: inpatient The patient remains inpatient due to need of pain mx, placement pending        LOS: 6 days   Time spent: 35 min    Nolberto Hanlon, MD [facility] Pager [phone]  If 7PM-7AM, please contact amion

## 2022-04-22 NOTE — Progress Notes (Signed)
   04/22/22 1240  Clinical Encounter Type  Visited With Patient  Visit Type Follow-up  Spiritual Encounters  Spiritual Needs Other (Comment) (HCPOA)   Chaplain Avagrace Botelho responded to [place], RN who let chaplain know of friends presence and asked to discuss HCPOA with them.  When this chaplain arrived at room the friend had left. Will continue to check in and offer assistance with this; may be too late for notary today but if needed please call on-call chaplain pager and will return.

## 2022-04-22 NOTE — Progress Notes (Signed)
Physical Therapy Treatment Patient Details Name: April [name] MRN: [id number] DOB: October 26, 1945 Today's Date: 04/22/2022   History of Present Illness Pt.  is an 76 y.o. 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    PT Comments    Patient is making slow progress with mobility efforts. The patient is agreeable to reposition in bed and continues to require assistance for bed mobility. Educated patient on importance of routine repositioning in bed for skin integrity. The patient declined sitting up on edge of bed or participating with LE exercises today. PT will continue to follow to maximize independence. SNF recommended at discharge.    Recommendations for follow up therapy are one component of a multi-disciplinary discharge planning process, led by the attending physician.  Recommendations may be updated based on patient status, additional functional criteria and insurance authorization.  Follow Up Recommendations  Skilled nursing-short term rehab (<3 hours/day) Can patient physically be transported by private vehicle: No   Assistance Recommended at Discharge Frequent or constant Supervision/Assistance  Patient can return home with the following Two people to help with walking and/or transfers;A lot of help with bathing/dressing/bathroom;Direct supervision/assist for medications management;Direct supervision/assist for financial management;Assist for transportation;Help with stairs or ramp for entrance   Equipment Recommendations   (to be determined at next level of care)    Recommendations for Other Services       Precautions / Restrictions Precautions Precautions: Shoulder;Fall Restrictions Weight Bearing Restrictions: Yes RUE Weight Bearing: Weight bearing as tolerated     Mobility  Bed Mobility Overal  bed mobility: Needs Assistance Bed Mobility: Rolling Rolling: Mod assist         General bed mobility comments: patient refusing to sit up this session. educated patient on the importance of routine repositioning to prevent against skin breakdown. assisted patient with repositioning and rolling to the left side with pillows placed to off load hip for comfort and skin integrity. educated patient on a turning schedule and how to use the bed controls to adjust head of bed- she was able to demonstrate understanding.    Transfers                        Ambulation/Gait                   Stairs             Wheelchair Mobility    Modified Rankin (Stroke Patients Only)       Balance                                            Cognition Arousal/Alertness: Awake/alert Behavior During Therapy: WFL for tasks assessed/performed Overall Cognitive Status: No family/caregiver present to determine baseline cognitive functioning                                 General Comments: patient is anxious at times and needs frequent redirection for attention to task as she is easily distracted by external factors        Exercises Other Exercises Other Exercises: the patient refused LE exercises this session    General  Comments        Pertinent Vitals/Pain Pain Assessment Pain Assessment: Faces Faces Pain Scale: Hurts a little bit Pain Location: stomach, back Pain Descriptors / Indicators: Moaning Pain Intervention(s): Limited activity within patient's tolerance    Home Living                          Prior Function            PT Goals (current goals can now be found in the care plan section) Acute Rehab PT Goals Patient Stated Goal: to be more comfortable PT Goal Formulation: With patient Time For Goal Achievement: 05/01/22 Potential to Achieve Goals: Fair Progress towards PT goals: Progressing toward goals     Frequency    Min 2X/week      PT Plan Current plan remains appropriate    Co-evaluation              AM-PAC PT "6 Clicks" Mobility   Outcome Measure  Help needed turning from your back to your side while in a flat bed without using bedrails?: A Lot Help needed moving from lying on your back to sitting on the side of a flat bed without using bedrails?: A Lot Help needed moving to and from a bed to a chair (including a wheelchair)?: Total Help needed standing up from a chair using your arms (e.g., wheelchair or bedside chair)?: Total Help needed to walk in hospital room?: Total Help needed climbing 3-5 steps with a railing? : Total 6 Click Score: 8    End of Session Equipment Utilized During Treatment: Oxygen Activity Tolerance: Patient limited by fatigue Patient left: in bed;with call bell/phone within reach;with bed alarm set   PT Visit Diagnosis: History of falling (Z91.81);Muscle weakness (generalized) (M62.81);Difficulty in walking, not elsewhere classified (R26.2);Pain     Time: 5056-9794 PT Time Calculation (min) (ACUTE ONLY): 22 min  Charges:  $Therapeutic Activity: 8-22 mins                     [name], PT, MPT    April [name] 04/22/2022, 2:07 PM

## 2022-04-22 NOTE — Progress Notes (Signed)
Occupational Therapy Treatment Patient Details Name: April [name] MRN: [id number] DOB: 1945/10/29 Today's Date: 04/22/2022   History of present illness Pt.  is an 76 y.o. 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   OT comments  April [name] was somewhat more confused than her normal today, having difficulty engaging in conversation, losing her train of thought mid-sentence and displaying a high degree of anxiety. She was able to engage in UE and LE PROM and AAROM, as well as following directions for AROM of her RUE. She reported that her pain was "not bad" this afternoon. Provided Mod A for rolling in bed, repositioned for comfort and skin integrity. Will continue to follow [facility].   Recommendations for follow up therapy are one component of a multi-disciplinary discharge planning process, led by the attending physician.  Recommendations may be updated based on patient status, additional functional criteria and insurance authorization.    Follow Up Recommendations  Skilled nursing-short term rehab (<3 hours/day)    Assistance Recommended at Discharge Frequent or constant Supervision/Assistance  Patient can return home with the following  A lot of help with walking and/or transfers;A lot of help with bathing/dressing/bathroom;Assistance with cooking/housework;Direct supervision/assist for medications management   Equipment Recommendations  None recommended by OT    Recommendations for Other Services      Precautions / Restrictions Precautions Precautions: Fall Type of Shoulder Precautions: Per Previous encounter: Pt 3 months out humeral fracture. Per Dr. Sharlet Salina, allowed gentle strengthening and shoulder mobility. Sling no longer required, no WB restrictions Restrictions Weight Bearing Restrictions: No RUE Weight  Bearing: Weight bearing as tolerated       Mobility Bed Mobility Overal bed mobility: Needs Assistance Bed Mobility: Rolling Rolling: Mod assist              Transfers                   General transfer comment: unable     Balance Overall balance assessment: Needs assistance   Sitting balance-Leahy Scale: Zero Sitting balance - Comments: Pt unable to come into upright sitting position, yelling out/moaning in pain throughout     Standing balance-Leahy Scale: Zero                             ADL either performed or assessed with clinical judgement   ADL                                         General ADL Comments: Max A ADLs.    Extremity/Trunk Assessment Upper Extremity Assessment Upper Extremity Assessment: RUE deficits/detail RUE Deficits / Details: Limited tolerance for right shoulder ROM   Lower Extremity Assessment Lower Extremity Assessment: Generalized weakness        Vision       Perception     Praxis      Cognition Arousal/Alertness: Awake/alert Behavior During Therapy: WFL for tasks assessed/performed Overall Cognitive Status: No family/caregiver present to determine baseline cognitive functioning                                 General Comments: patient  is anxious at times and needs frequent redirection for attention to task as she is easily distracted by external factors        Exercises Other Exercises Other Exercises: UE and LE therex; educ re: importance of mobility, repositioning    Shoulder Instructions       General Comments      Pertinent Vitals/ Pain       Pain Assessment Faces Pain Scale: Hurts a little bit Pain Intervention(s): Repositioned, Utilized relaxation techniques  Home Living                                          Prior Functioning/Environment              Frequency  Min 2X/week        Progress Toward Goals  OT  Goals(current goals can now be found in the care plan section)  Progress towards OT goals: Progressing toward goals  Acute Rehab OT Goals OT Goal Formulation: With patient Time For Goal Achievement: 05/02/22 Potential to Achieve Goals: Good  Plan Discharge plan remains appropriate;Frequency remains appropriate    Co-evaluation                 AM-PAC OT "6 Clicks" Daily Activity     Outcome Measure   Help from another person eating meals?: A Little Help from another person taking care of personal grooming?: A Lot Help from another person toileting, which includes using toliet, bedpan, or urinal?: A Lot Help from another person bathing (including washing, rinsing, drying)?: A Lot Help from another person to put on and taking off regular upper body clothing?: A Lot Help from another person to put on and taking off regular lower body clothing?: A Lot 6 Click Score: 13    End of Session    OT Visit Diagnosis: Unsteadiness on feet (R26.81);Muscle weakness (generalized) (M62.81)   Activity Tolerance Patient tolerated treatment well   Patient Left in bed;with call bell/phone within reach;with bed alarm set   Nurse Communication Mobility status        Time: 2025-4270 OT Time Calculation (min): 14 min  Charges: OT General Charges $OT Visit: 1 Visit OT Treatments $Self Care/Home Management : 8-22 mins Josiah Lobo, PhD, MS, OTR/L 04/22/22, 3:49 PM

## 2022-04-22 NOTE — Progress Notes (Signed)
[place] at [facility] Telephone:([phone]) [phone] Fax:([phone]   Name: April [name] Date: 04/22/2022 MRN: [id number]  DOB: 01-11-1946  Patient Care Team: Albina Billet, MD as PCP - General (Internal Medicine)    REASON FOR CONSULTATION: April [name] is a 76 y.o. female with multiple medical problems including DM, hypertension, PVD, history of lung mass but declined further work-up.  Patient was recently hospitalized 7/8 to 04/10/2022 for falls and was treated for UTI.  She was readmitted 04/15/2022 with possible acute osteomyelitis of the mandible.  Work-up including CTs appear consistent with widespread neoplastic process involving the colon, liver, and bone. Marland Kitchen   CODE STATUS: Full code  PAST MEDICAL HISTORY: Past Medical History:  Diagnosis Date   Acute renal failure (ARF) ([place])    Difficult intubation    Hypertension    NSTEMI (non-ST elevated myocardial infarction) ([place])    Paroxysmal atrial fibrillation (HCC)    Renal disorder    Sepsis [facility]) January 2017   [facility]   Type 2 diabetes mellitus ([place])     PAST SURGICAL HISTORY:  Past Surgical History:  Procedure Laterality Date   broken hip  2019   CYSTOSCOPY WITH STENT PLACEMENT Right 11/03/2015   Procedure: CYSTOSCOPY WITH STENT PLACEMENT;  Surgeon: Hollice Espy, MD;  Location: [facility] ORS;  Service: Urology;  Laterality: Right;   FRACTURE SURGERY Right    Elbow   INTRAMEDULLARY (IM) NAIL INTERTROCHANTERIC Right 02/07/2019   Procedure: INTRAMEDULLARY (IM) NAIL INTERTROCHANTRIC;  Surgeon: Thornton Park, MD;  Location: [facility] ORS;  Service: Orthopedics;  Laterality: Right;   TONSILLECTOMY     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    ALLERGIES:  is allergic to  prednisone and other.  MEDICATIONS:  Current Facility-Administered Medications  Medication Dose Route Frequency Provider Last Rate Last Admin   acetaminophen (TYLENOL) tablet 650 mg  650 mg Oral Q6H PRN Athena Masse, MD   650 mg at 04/18/22 2038   Or   acetaminophen (TYLENOL) suppository 650 mg  650 mg Rectal Q6H PRN Athena Masse, MD       amLODipine (NORVASC) tablet 5 mg  5 mg Oral Daily Nolberto Hanlon, MD   5 mg at 04/22/22 0941   Ampicillin-Sulbactam (UNASYN) 3 g in sodium chloride 0.9 % 100 mL IVPB  3 g Intravenous Q6H Chinita Greenland A, RPH 200 mL/hr at 04/22/22 0940 3 g at 04/22/22 0940   bisacodyl (DULCOLAX) suppository 10 mg  10 mg Rectal Daily PRN Nolberto Hanlon, MD       chlorhexidine (PERIDEX) 0.12 % solution 10 mL  10 mL Mouth/Throat QID Beverly Gust, MD   10 mL at 04/22/22 1301   docusate sodium (COLACE) capsule 100 mg  100 mg Oral BID Judd Gaudier V, MD   100 mg at 04/22/22 0941   empagliflozin (JARDIANCE) tablet 10 mg  10 mg Oral Daily Judd Gaudier V, MD   10 mg at 04/22/22 0941   enoxaparin (LOVENOX) injection 45 mg  0.5 mg/kg Subcutaneous Q24H Judd Gaudier V, MD   45 mg at 04/21/22 2215   gabapentin (NEURONTIN) capsule 300 mg  300 mg Oral TID Athena Masse, MD   300 mg at 04/22/22 0941   insulin aspart (novoLOG) injection 0-15 Units  0-15  Units Subcutaneous TID WC Duncan, Hazel V, MD   2 Units at 04/22/22 0748   insulin aspart (novoLOG) injection 0-5 Units  0-5 Units Subcutaneous QHS Duncan, Hazel V, MD       metFORMIN (GLUCOPHAGE) tablet 500 mg  500 mg Oral BID Duncan, Hazel V, MD   500 mg at 04/22/22 0750   metoprolol tartrate (LOPRESSOR) tablet 25 mg  25 mg Oral BID Duncan, Hazel V, MD   25 mg at 04/22/22 0942   morphine (PF) 2 MG/ML injection 2 mg  2 mg Intravenous Q2H PRN Duncan, Hazel V, MD   2 mg at 04/22/22 0750   ondansetron (ZOFRAN) tablet 4 mg  4 mg Oral Q6H PRN Duncan, Hazel V, MD       Or   ondansetron (ZOFRAN) injection 4 mg  4 mg Intravenous Q6H PRN  Duncan, Hazel V, MD   4 mg at 04/16/22 0057   oxyCODONE (Oxy IR/ROXICODONE) immediate release tablet 5 mg  5 mg Oral Q6H PRN Amery, Sahar, MD   5 mg at 04/22/22 1026   polyethylene glycol (MIRALAX / GLYCOLAX) packet 17 g  17 g Oral BID Amery, Sahar, MD   17 g at 04/22/22 0940   potassium chloride SA (KLOR-CON M) CR tablet 40 mEq  40 mEq Oral Q4H Amery, Sahar, MD   40 mEq at 04/22/22 1254   pravastatin (PRAVACHOL) tablet 40 mg  40 mg Oral Daily Duncan, Hazel V, MD   40 mg at 04/22/22 0941   sodium phosphate (FLEET) 7-19 GM/118ML enema 1 enema  1 enema Rectal Daily PRN Amery, Sahar, MD        VITAL SIGNS: BP 130/69 (BP Location: Left Arm)   Pulse (!) 108   Temp 98.7 F (37.1 C)   Resp 16   Ht 5' 6" (1.676 m)   Wt 201 lb 8 oz (91.4 kg)   SpO2 90%   BMI 32.52 kg/m  Filed Weights   04/15/22 1441  Weight: 201 lb 8 oz (91.4 kg)    Estimated body mass index is 32.52 kg/m as calculated from the following:   Height as of this encounter: 5' 6" (1.676 m).   Weight as of this encounter: 201 lb 8 oz (91.4 kg).  LABS: CBC:    Component Value Date/Time   WBC 22.9 (H) 04/21/2022 1135   HGB 12.8 04/21/2022 1135   HCT 41.7 04/21/2022 1135   PLT 337 04/21/2022 1135   MCV 89.1 04/21/2022 1135   NEUTROABS 16.5 (H) 04/06/2022 0819   LYMPHSABS 0.8 04/06/2022 0819   MONOABS 1.2 (H) 04/06/2022 0819   EOSABS 0.5 04/06/2022 0819   BASOSABS 0.1 04/06/2022 0819   Comprehensive Metabolic Panel:    Component Value Date/Time   NA 138 04/20/2022 1545   K 2.8 (L) 04/22/2022 0535   CL 97 (L) 04/20/2022 1545   CO2 31 04/20/2022 1545   BUN 9 04/20/2022 1545   CREATININE 0.38 (L) 04/20/2022 1545   GLUCOSE 121 (H) 04/20/2022 1545   CALCIUM 8.6 (L) 04/20/2022 1545   AST 48 (H) 04/15/2022 1442   ALT 15 04/15/2022 1442   ALKPHOS 561 (H) 04/15/2022 1442   BILITOT 1.2 04/15/2022 1442   PROT 6.9 04/15/2022 1442   ALBUMIN 2.4 (L) 04/15/2022 1442    RADIOGRAPHIC STUDIES: DG Chest Port 1 View  Result  Date: 04/20/2022 CLINICAL DATA:  Fever, altered mental status EXAM: PORTABLE CHEST 1 VIEW COMPARISON:  Previous studies including the examination of 04/02/2022 FINDINGS: Cardiac   size is within normal limits. Thoracic aorta is tortuous and ectatic. Central pulmonary vessels are prominent. Increased interstitial markings and patchy alveolar densities are seen in the parahilar regions and lower lung fields. Lateral CP angles are clear. There is no pneumothorax. Degenerative changes are noted in both shoulders. IMPRESSION: Central pulmonary vessels are prominent. Increased interstitial and alveolar markings in the parahilar regions and lower lung fields may suggest pulmonary edema and possibly multifocal pneumonia. Electronically Signed   By: Palani  Rathinasamy M.D.   On: 04/20/2022 13:47   US BIOPSY (LIVER)  Result Date: 04/19/2022 INDICATION: Hepatic masses including large left lobe hepatic mass, multiple pulmonary masses and sigmoid colonic mass. The patient presents for liver biopsy. EXAM: ULTRASOUND GUIDED CORE BIOPSY OF LIVER MEDICATIONS: None. ANESTHESIA/SEDATION: Moderate (conscious) sedation was employed during this procedure. A total of Fentanyl 50 mcg was administered intravenously by radiology nursing. Moderate Sedation Time: 15 minutes. The patient's level of consciousness and vital signs were monitored continuously by radiology nursing throughout the procedure under my direct supervision. PROCEDURE: The procedure, risks, benefits, and alternatives were explained to the patient. Questions regarding the procedure were encouraged and answered. The patient understands and consents to the procedure. A time-out was performed prior to initiating the procedure. The abdominal wall was prepped with chlorhexidine in a sterile fashion, and a sterile drape was applied covering the operative field. A sterile gown and sterile gloves were used for the procedure. Local anesthesia was provided with 1% Lidocaine. After  localizing a left lobe liver mass by ultrasound, a 17 gauge trocar needle was advanced into the lesion under direct ultrasound guidance. Three separate coaxial 18 gauge core biopsy samples were obtained and submitted in formalin. Gel-Foam pledgets were advanced through the outer needle as it was retracted and removed. Additional ultrasound was performed. COMPLICATIONS: None immediate. FINDINGS: Large hyperechoic oval-shaped mass in the left lobe of the liver measures up to 7 cm in greatest diameter by ultrasound. Solid tissue was obtained from the lesion. IMPRESSION: Ultrasound-guided core biopsy performed of a large mass in the left lobe of the liver measuring up to approximately 7 cm in greatest diameter. Electronically Signed   By: Glenn  Yamagata M.D.   On: 04/19/2022 11:39   CT ABDOMEN PELVIS W CONTRAST  Result Date: 04/15/2022 CLINICAL DATA:  Abdominal pain and distention EXAM: CT ABDOMEN AND PELVIS WITH CONTRAST TECHNIQUE: Multidetector CT imaging of the abdomen and pelvis was performed using the standard protocol following bolus administration of intravenous contrast. RADIATION DOSE REDUCTION: This exam was performed according to the departmental dose-optimization program which includes automated exposure control, adjustment of the mA and/or kV according to patient size and/or use of iterative reconstruction technique. CONTRAST:  100mL OMNIPAQUE IOHEXOL 300 MG/ML  SOLN COMPARISON:  Previous studies including the CT abdomen and pelvis done on 01/05/2016 and CT chest done on 01/29/2022 FINDINGS: Lower chest: There are multiple nodules of varying sizes in the lower lung fields. There is a 3.2 cm nodule/mass in the left lower lobe. There are small patchy infiltrates in both lower lobes. Minimal bilateral pleural effusions are seen. Dense calcification is seen in mitral annulus. Hepatobiliary: There is interval appearance of multiple low-density space-occupying lesions in liver largest measuring 7.5 x 4.1 cm  in the anterior aspect of the liver. There is no dilation of bile ducts. There is increased density in the dependent portion of gallbladder suggesting gallbladder stones. Pancreas: There is atrophy.  No focal abnormalities are seen. Spleen: Unremarkable. Adrenals/Urinary Tract: There is 11 mm   nodule in left adrenal. There are small nodular densities in right adrenal measuring up to 11 mm. There is no hydronephrosis. Lobulations are seen in the margins of the kidneys, possibly suggesting scarring. There is 17 mm calculus in the upper pole of right kidney. There is 12 mm calculus in the midportion. There is 12 mm calculus in the lower pole. Ureters are not dilated. Urinary bladder is distended. There is air in the bladder, possibly suggesting recent instrumentation. Diverticulum is noted in the left posterior aspect of the urinary bladder. Stomach/Bowel: Small hiatal hernia is seen. Stomach is not distended. Small bowel loops not dilated. Appendix is not dilated. Scattered diverticula seen in colon without signs of focal acute diverticulitis. There is wall thickening in 5.7 cm segment of sigmoid colon in midline. Oral contrast has reached the rectum. There is large amount of stool in rectum. Transverse diameter of rectum measures 9.4 cm. There is no significant wall thickening in rectum. Vascular/Lymphatic: Scattered arterial calcifications are seen. Reproductive: There is low-density in the central portion of the uterus. There is 2.2 cm fluid density structure in the right adnexa. Other: There is no ascites or pneumoperitoneum. Umbilical hernia containing fat is seen. Musculoskeletal: There is previous internal fixation in right femur with intramedullary rod. Degenerative changes are noted in right hip. Degenerative changes are noted in lower thoracic spine and lumbar spine. There is fracture in the upper endplate of body of L3 vertebra which may be recent or old. Decrease in height of multiple thoracic vertebral  bodies may suggest recent or old compression fractures. There is inhomogeneous in attenuation in the bony structures this may be due to osteoporosis or infiltrative neoplastic process. There is a lytic lesion with adjacent soft tissue mass in the posterior aspect of left iliac crest. IMPRESSION: There is 5.7 cm mass lesion in sigmoid colon suggesting possible primary malignant neoplasm. There is no evidence of intestinal obstruction or pneumoperitoneum. There is no hydronephrosis. There are multiple nodules of varying sizes in the visualized lower lung fields consistent with pulmonary metastatic disease. Patchy infiltrates in the lower lung fields may suggest atelectasis/pneumonia. Small bilateral pleural effusions. There are multiple space-occupying lesions of varying sizes in the liver measuring up to 7.5 cm in size suggesting hepatic metastatic disease. There are few nodular densities in both adrenals suggesting possible metastatic disease. There is inhomogeneous attenuation in the bony structures suggesting possible skeletal metastatic disease. There is a lytic lesion in the posterior medial aspect of left iliac crest consistent with skeletal metastatic disease. Other findings as described in the body of the report. Electronically Signed   By: Elmer Picker M.D.   On: 04/15/2022 20:16   CT Maxillofacial W Contrast  Result Date: 04/15/2022 CLINICAL DATA:  Facial abscess EXAM: CT MAXILLOFACIAL WITH CONTRAST TECHNIQUE: Multidetector CT imaging of the maxillofacial structures was performed with intravenous contrast. Multiplanar CT image reconstructions were also generated. RADIATION DOSE REDUCTION: This exam was performed according to the departmental dose-optimization program which includes automated exposure control, adjustment of the mA and/or kV according to patient size and/or use of iterative reconstruction technique. CONTRAST:  159m OMNIPAQUE IOHEXOL 300 MG/ML  SOLN COMPARISON:  None Available.  FINDINGS: Osseous: The right mandibular body is expanded and hyperlucent with the abnormal area encompassing the roots of the right mandibular molars. There is multifocal cortical destruction. There is a surrounding soft tissue component Orbits: Negative. No traumatic or inflammatory finding. Sinuses: Clear. Soft tissues: Soft tissue swelling surrounding the right mandibular body. Limited intracranial: No  significant or unexpected finding. 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 Clinical follow-up after resolution of symptoms is recommended to exclude a superimposed neoplastic process. Electronically Signed   By: Ulyses Jarred M.D.   On: 04/15/2022 20:04   DG Abdomen 1 View  Result Date: 04/15/2022 CLINICAL DATA:  Constipation.  Abdominal distension. EXAM: ABDOMEN - 1 VIEW COMPARISON:  04/09/22 FINDINGS: Gallstones are identified. There is been interval decrease in previously noted air-filled loops of small bowel within the left hemiabdomen. Progressive accumulation of stool is identified throughout the colon and rectum. Large volume of desiccated stool within the rectum has increased in volume compared with the previous study. Cannot rule out rectal impaction. No signs of pneumoperitoneum. Lumbar scoliosis and degenerative disc disease. Status post ORIF of the right femur. Marked degenerative disc disease is noted involving the right hip. Moderate degenerative disc disease noted in the left hip. IMPRESSION: 1. Progressive accumulation of stool throughout the colon and rectum compatible with constipation. Cannot rule out rectal impaction. 2. Interval decrease in previously noted air-filled loops of small bowel within the left hemiabdomen. 3. Gallstones. Electronically Signed   By: Kerby Moors M.D.   On: 04/15/2022 16:13   DG Abd 1 View  Result  Date: 04/09/2022 CLINICAL DATA:  Constipation. EXAM: ABDOMEN - 1 VIEW COMPARISON:  Pelvis radiograph 04/02/2022 FINDINGS: Prominent stool noted at the rectum. Mild gaseous distension above this point. No definite free air. Calcifications in the right abdomen likely gallstones. IMPRESSION: 1. Prominent stool at the rectum with mild gaseous distension above this point. 2. Cholelithiasis. Electronically Signed   By: San Morelle M.D.   On: 04/09/2022 12:27   DG Chest Portable 1 View  Result Date: 04/02/2022 CLINICAL DATA:  Recent fall with right-sided pain, initial encounter EXAM: PORTABLE CHEST 1 VIEW COMPARISON:  01/28/2022 FINDINGS: Cardiac shadow is within normal limits. Aortic calcifications are again seen. Increased vascular congestion and mild edema is noted. No focal confluent infiltrate is seen. Old rib fractures are noted bilaterally with healing. IMPRESSION: No acute abnormality noted. Electronically Signed   By: Inez Catalina M.D.   On: 04/02/2022 21:50   DG Knee 2 Views Right  Result Date: 04/02/2022 CLINICAL DATA:  Right leg pain following fall, initial encounter EXAM: RIGHT KNEE - 2 VIEW COMPARISON:  None Available. FINDINGS: Postsurgical changes in the distal femur are noted. No acute fracture is noted. IMPRESSION: No acute abnormality noted. Electronically Signed   By: Inez Catalina M.D.   On: 04/02/2022 21:49   DG Hip Unilat W or Wo Pelvis 2-3 Views Right  Result Date: 04/02/2022 CLINICAL DATA:  Right-sided hip pain following fall several hours ago, initial encounter EXAM: DG HIP (WITH OR WITHOUT PELVIS) 2-3V RIGHT COMPARISON:  02/07/2019 FINDINGS: Pelvic ring is intact. Postsurgical changes in the proximal right femur are noted. Degenerative changes of the right hip joint are noted. No acute fracture or dislocation is seen. No soft tissue changes are noted. IMPRESSION: Degenerative change in the right hip joint. No acute abnormality is noted. Electronically Signed   By: Inez Catalina  M.D.   On: 04/02/2022 21:48    PERFORMANCE STATUS (ECOG) : 4 - Bedbound  Review of Systems Unless otherwise noted, a complete review of systems is negative.  Physical Exam General: NAD Pulmonary: Exertionally labored, on O2 Abdomen: soft, nontender, + bowel sounds GU: no suprapubic tenderness Extremities: no edema, no  joint deformities Skin: no rashes Neurological: Weakness but otherwise nonfocal  IMPRESSION: I met again with patient to discuss goals.  Patient recalls our conversation from yesterday.  She is able to readily relate her conversation with Dr. Tasia Catchings.  Patient recognizes that she has not felt to have any viable treatment options for stage IV colorectal cancer.  Hospice has been recommended.  I discussed the option of hospice at length with patient today.  She continues to be fairly indecisive about her wishes.  At one point she seemed to be leaning towards hospice and then started talking about getting better at rehab.  Eventually, she seemed to settle on the idea of going to a hospice facility per the recommendations of her friend, [name].  However, patient remains firm that she wants to be resuscitated despite my repeated explanation of the futility of such measures.  Ultimately, she said that she needed more time to process.  I will involve the hospice liaison to help coordinate.  PLAN: -Continue current scope of treatment -Hospice liaison to help coordinate  Case and plan discussed with Dr. Tasia Catchings and Dr. Kurtis Bushman   Time Total: 74 minutes  Visit consisted of counseling and education dealing with the complex and emotionally intense issues of symptom management and palliative care in the setting of serious and potentially life-threatening illness.Greater than 50%  of this time was spent counseling and coordinating care related to the above assessment and plan.  Signed by: Altha Harm, PhD, NP-C

## 2022-04-22 NOTE — Progress Notes (Addendum)
   04/22/22 0910  Clinical Encounter Type  Visited With Patient;Health care provider  Visit Type Initial;Spiritual support;Social support  Referral From Nurse  Consult/Referral To [place] responded to consult request for assistance with AD documents.  Chaplain Joselynn Amoroso saw the form left recently by [employer]. Attempted to discuss whether this could be completed now and to assess pt's ability to authorize, her preferences, etc.  Pt indicated that "that had already been taken care of" and when this chaplain highlighted that the form was still blank pt stated "there was another that had been done." This chaplain let pt know we did not have a record of that but further attempts to discuss HCPOA were not productive.  Chaplain B shifted to address palliative care status and assess spiritual and emotional needs of the pt. Offered compassionate presence and hospitality (helped to get coffee w RN approval, assisted with safe drinking position). Pt stated that she was experiencing pain. 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 Chaplain B offered ways to name sources of gratitude and hope, facilitated view to stay grounded in the present and helped pt think of sources of support (friends, faith, chaplain). We prayed together; visit was appreciated.  Let pt know that spiritual care support would remain available; will attempt to f/u. Communicated with NT regarding pt's complaint about purewick posiiton.

## 2022-04-23 DIAGNOSIS — M272 Inflammatory conditions of jaws: Secondary | ICD-10-CM | POA: Diagnosis not present

## 2022-04-23 DIAGNOSIS — Z7189 Other specified counseling: Secondary | ICD-10-CM | POA: Diagnosis not present

## 2022-04-23 DIAGNOSIS — G8929 Other chronic pain: Secondary | ICD-10-CM | POA: Diagnosis not present

## 2022-04-23 DIAGNOSIS — C189 Malignant neoplasm of colon, unspecified: Secondary | ICD-10-CM | POA: Diagnosis not present

## 2022-04-23 DIAGNOSIS — K5900 Constipation, unspecified: Secondary | ICD-10-CM | POA: Diagnosis not present

## 2022-04-23 LAB — GLUCOSE, CAPILLARY
Glucose-Capillary: 110 mg/dL — ABNORMAL HIGH (ref 70–99)
Glucose-Capillary: 131 mg/dL — ABNORMAL HIGH (ref 70–99)
Glucose-Capillary: 88 mg/dL (ref 70–99)
Glucose-Capillary: 110 mg/dL — ABNORMAL HIGH (ref 70–99)

## 2022-04-23 NOTE — Assessment & Plan Note (Signed)
Patient and family leaning towards comfort care/hospice home

## 2022-04-23 NOTE — TOC Progression Note (Signed)
Transition of Care [facility]) - Progression Note    Patient Details  Name: April [name] MRN: [id number] Date of Birth: 1946/02/11  Transition of Care [name]) CM/SW Contact  Zigmund Daniel Dorian Pod, RN Phone [phone] 04/23/2022, 2:04 PM  Clinical Narrative:    Based upon the consideration for hospice services as education took place today. TOC also spoke with the friend April [name] who has indicated [place] home would be the appropriate placement for this pt upon discharged from the hospital. [name] states, she is trying to get the cousin April [name] here tomorrow to the complete the necessary paperwork for this to occur and make the final decisions concerning pt's disposition upon her discharge. At this time the friend April [name]) has indicated that the pt is unable to care for herself and she is the only local support person for any assistance. States she obtains all her medications and provides transportation when needed. States pt currently uses a local transportation services for local appointments and transportation needs.  Caregiver friend is aware in order for pt to admit into the hospice house she would need a DNR status to be considered. She indicated she is aware.  TOC spoke with April [name] (liaison for Hospice) concerning the above information for a pending meeting tomorrow for the parties involved. Orders obtained for TOC and hospice will further evaluated for the appropriate services that will be presented to the friend/cousin moving forward.  TOC will remain available for ongoing discharge disposition.       Expected Discharge Plan and Services           Expected Discharge Date: 04/17/22                                     Social Determinants of Health (SDOH) Interventions    Readmission Risk Interventions    04/04/2022    1:53 PM  Readmission Risk Prevention Plan  Post Dischage Appt Complete  Medication Screening Complete   Transportation Screening Complete

## 2022-04-23 NOTE — Progress Notes (Signed)
[id number]) Hospital Liaison Note   Received referral from [place], Lattie Haw, to meet with Ms. Mceuen and discuss hospice services, and philosophy. Met with patient and discussed hospice services, she requested that we contact her HCPOA, Jewel. Contacted and had hospice discussion with Jewel. Per her request we will meet tomorrow at 1:00 in Ms. Kuna's room to have a more in depth conversation.   Thank you,  Bea Laura MSN [facility] Liaison  [phone]

## 2022-04-23 NOTE — Assessment & Plan Note (Signed)
Metastatic colon cancer with liver, bone, and lung involvement per Dr. Tasia Catchings

## 2022-04-23 NOTE — Assessment & Plan Note (Addendum)
Metastatic colon cancer with liver, bone, and lung involvement

## 2022-04-23 NOTE — Assessment & Plan Note (Signed)
Leaning towards hospice

## 2022-04-23 NOTE — Assessment & Plan Note (Signed)
Repleted tand resolved

## 2022-04-23 NOTE — Progress Notes (Signed)
Progress Note   Patient: April [name] DOB: 09/23/1946 DOA: 04/15/2022     7 DOS: the patient was seen and examined on 04/23/2022   Brief hospital course: April [name] is a 76 y.o. female with medical history significant for Type II DM, HTN, PVD, recently hospitalized from 7/8 to 7/16 for frequent falls and discharged to SNF, who at that time had chronic leukocytosis of unknown etiology but found to have Klebsiella UTI for which she was treated and who at the same time had a known lung mass seen on CT 01/30/2022 for which she declined treatment or further investigation, evaluated by palliative care prior to discharge, who now returns to the ED with a complaint of abdominal pain, constipation, and pain all over, including pain on her right jaw and ' something going on with her tooth'.     ENT was consulted.  Oncology also was consulted they recommended ultrasound-guided liver biopsy which was done on 7/25.  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  She does not have much option for treatment.    7/29: Patient and HCPOA leaning towards hospice.   Assessment and Plan: Colon cancer metastasized to liver, lung and bone (HCC) Constipation, multifactorial Patient presents with constipation and abdominal pain, possibly related to sigmoid cancer seen on CT though without evidence of obstruction Patient is on chronic narcotics so this could also be the etiology of her constipation Stool softeners, laxatives and enema if needed Comfort care plan  Acute osteomyelitis of mandible Likely fungating mass/malignancy Patient has right-sided jaw pain and swelling with leukocytosis of 27,000 and abnormal CT findings CT shows: Expanded right mandibular body with surrounding soft tissue and multifocal cortical destruction, likely indicating active osteomyelitis with an intra osseous necrotic or purulence collection Completed antibiotics  Hypoxia O2 sat was 90-94 in the ED and patient was placed on O2  at 2 to 3 L Continue O2 and wean as tolerated Suspect etiology related to lung metastases, possibly underlying OSA Evaluate for home O2 at discharge  Hypokalemia Repleted tand resolved   Multiple lesions of metastatic malignancy ([place]) Metastatic colon cancer with liver, bone, and lung involvement  Palliative care encounter Leaning towards hospice  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 Tasia Catchings  Goals of care, counseling/discussion Patient and family leaning towards comfort care/hospice home  Frailty syndrome in geriatric patient Patient with a history of recent frequent falls resulting in injury, untreated metastatic cancer, recently hospitalized and discharged to SNF [facility] and nutritionist consult  Chronic pain Continue gabapentin, oxycodone, morphine and acetaminophen.  PAD (peripheral artery disease) (HCC) Continue pravastatin.  Type 2 diabetes mellitus (HCC) Continue sliding scale insulin coverage.  Stopping Jardiance and metformin as she has very poor p.o. intake        Subjective: Wants to be comfortable but not sure if she can manage herself at home so requesting hospice home  Physical Exam: Vitals:   04/22/22 0830 04/22/22 1938 04/23/22 0409 04/23/22 0752  BP: 130/69 122/68 119/62 129/70  Pulse: (!) 108 (!) 110 100 (!) 101  Resp: 16 17 18 16   Temp: 98.7 F (37.1 C) 98.3 F (36.8 C) 98.1 F (36.7 C) (!) 97.4 F (36.3 C)  TempSrc:  Oral Oral   SpO2: 90% 95% 95% 96%  Weight:      Height:       76 year old female lying in the bed comfortably Lungs decreased air entry at the bases  Heart regular rate and rhythm Abdomen soft, benign Extremities no pedal edema cyanosis Neuro alert and awake, nonfocal Oral cavity: Fungating mass on the right mandible area inside the mouth  Data Reviewed:  Blood sugars within normal limit  Family Communication: Updated  HCPOA over phone  Disposition: Status is: Inpatient Remains inpatient appropriate because: Awaiting hospice liaison evaluation for hospice home   Planned Discharge Destination: Hospice home    DVT prophylaxis-Lovenox Time spent: 35 minutes  Author: Max Sane, MD 04/23/2022 1:41 PM  For on call review [email or web address].

## 2022-04-23 NOTE — Hospital Course (Signed)
April [name] is a 76 y.o. female with medical history significant for Type II DM, HTN, PVD, recently hospitalized from 7/8 to 7/16 for frequent falls and discharged to SNF, who at that time had chronic leukocytosis of unknown etiology but found to have Klebsiella UTI for which she was treated and who at the same time had a known lung mass seen on CT 01/30/2022 for which she declined treatment or further investigation, evaluated by palliative care prior to discharge, who now returns to the ED with a complaint of abdominal pain, constipation, and pain all over, including pain on her right jaw and ' something going on with her tooth'.     ENT was consulted.  Oncology also was consulted they recommended ultrasound-guided liver biopsy which was done on 7/25.  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  She does not have much option for treatment.    7/29: Patient and HCPOA leaning towards hospice.

## 2022-04-23 NOTE — IPAL (Signed)
  Interdisciplinary Goals of Care Family Meeting   Date carried out: 04/23/2022  Location of the meeting: Bedside  Member's involved: Physician and Family Member or next of kin  Durable Power of Attorney or Loss adjuster, chartered: Discussed with HCPOA  Discussion: We discussed goals of care for April [name] is a 76 year old female with multiple medical problems including diabetes, hypertension, PVD, history of lung mass not amenable for further work-up.  Patient had been hospitalized 7/8 - 04/10/22 for falls and UTI.  She was readmitted on 04/15/2022.  Work-up is worrisome for widespread metastatic disease spread to colon, liver, bone.  Code status: Full Code  Disposition: Home with Hospice, patient and family prefers hospice home if she is eligible.  Meeting scheduled with hospice liaison and family tomorrow at 1 PM  Time spent for the meeting: 15 minutes    Max Sane, MD  04/23/2022, 1:31 PM

## 2022-04-24 DIAGNOSIS — G8929 Other chronic pain: Secondary | ICD-10-CM | POA: Diagnosis not present

## 2022-04-24 DIAGNOSIS — C189 Malignant neoplasm of colon, unspecified: Secondary | ICD-10-CM | POA: Diagnosis not present

## 2022-04-24 DIAGNOSIS — M272 Inflammatory conditions of jaws: Secondary | ICD-10-CM | POA: Diagnosis not present

## 2022-04-24 DIAGNOSIS — K5900 Constipation, unspecified: Secondary | ICD-10-CM | POA: Diagnosis not present

## 2022-04-24 MED ORDER — RISPERIDONE 1 MG PO TBDP
0.5000 mg | ORAL_TABLET | Freq: Two times a day (BID) | ORAL | Status: DC
Start: 2022-04-24 — End: 2022-04-24
  Administered 2022-04-24: 0.5 mg via ORAL
  Filled 2022-04-24: qty 0.5

## 2022-04-24 MED ORDER — OXYCODONE HCL 5 MG PO TABS
5.0000 mg | ORAL_TABLET | ORAL | Status: DC | PRN
  Administered 2022-04-24: 5 mg via ORAL
  Filled 2022-04-24: qty 1

## 2022-04-24 MED ORDER — LORAZEPAM 2 MG/ML IJ SOLN: 1.0000 mg | INTRAMUSCULAR | Status: DC | PRN

## 2022-04-24 MED ORDER — LORAZEPAM 1 MG PO TABS
1.0000 mg | ORAL_TABLET | ORAL | Status: DC | PRN
  Administered 2022-04-24: 1 mg via ORAL
  Filled 2022-04-24: qty 1

## 2022-04-24 MED ORDER — MORPHINE SULFATE (CONCENTRATE) 10 MG /0.5 ML PO SOLN: 5.0000 mg | ORAL | 0 refills | Status: AC | PRN

## 2022-04-24 MED ORDER — LORAZEPAM 0.5 MG PO TABS: 0.5000 mg | ORAL_TABLET | Freq: Three times a day (TID) | ORAL | 0 refills | Status: AC | PRN

## 2022-04-24 MED ORDER — TRAZODONE HCL 50 MG PO TABS: 25.0000 mg | ORAL_TABLET | Freq: Every evening | ORAL | Status: DC | PRN

## 2022-04-24 MED ORDER — MORPHINE SULFATE (PF) 2 MG/ML IV SOLN: 2.0000 mg | INTRAVENOUS | Status: DC | PRN

## 2022-04-24 MED ORDER — LORAZEPAM 2 MG/ML PO CONC: 1.0000 mg | ORAL | Status: DC | PRN

## 2022-04-24 NOTE — Progress Notes (Signed)
[facility]) Hospital Liaison Note  Received request from [place], Raina Mina, for family interest in [facility]. Met with patient, and her Susanne Greenhouse, to confirm interest and explain services. Patient has been approved for hospice home; Family agreeable to transfer patient to [facility] today.    MD and TOC aware.    RN please call report to [phone] prior to patient leaving the unit.    Please send signed and completed DNR with patient at discharge.   Please call with any questions or concerns.   Thank you,   Bea Laura MSN [facility] Liaison [phone]

## 2022-04-24 NOTE — Discharge Summary (Signed)
Physician Discharge Summary   Patient: April [name] MRN: [id number] DOB: 1946/01/17  Admit date:     04/15/2022  Discharge date: 04/24/22  Discharge Physician: Max Sane   PCP: Albina Billet, MD   Recommendations at discharge:   Hospice home  Discharge Diagnoses: Principal Problem:   Constipation Active Problems:   Colon cancer metastasized to liver, lung and bone ([place])   Leukocytosis   Acute osteomyelitis of mandible   Type 2 diabetes mellitus ([place])   PAD (peripheral artery disease) ([place])   Essential hypertension   Chronic pain   Frailty syndrome in geriatric patient   Chronic respiratory failure with hypoxia ([place])   Osteomyelitis of mandible   Goals of care, counseling/discussion   Colonic mass   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 female with medical history significant for Type II DM, HTN, PVD, recently hospitalized from 7/8 to 7/16 for frequent falls and discharged to SNF, who at that time had chronic leukocytosis of unknown etiology but found to have Klebsiella UTI for which she was treated and who at the same time had a known lung mass seen on CT 01/30/2022 for which she declined treatment or further investigation, evaluated by palliative care prior to discharge, who now returns to the ED with a complaint of abdominal pain, constipation, and pain all over, including pain on her right jaw and ' something going on with her tooth'.     ENT was consulted.  Oncology also was consulted they recommended ultrasound-guided liver biopsy which was done on 7/25.  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  She does not have much option for treatment.    7/29: Patient and HCPOA leaning towards hospice.  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  Prognosis less than 2 weeks      Consultants: Oncology, ENT, IR, palliative care, ID Procedures performed: US guided biopsy of the liver mass on 7/25 by IR Disposition: Hospice care Diet recommendation:  Discharge Diet Orders (From admission, onward)     Start     Ordered   04/24/22 0000  Diet - low sodium heart healthy        04/24/22 1323           Regular diet DISCHARGE MEDICATION: Allergies as of 04/24/2022       Reactions   Prednisone Anaphylaxis   Other         Medication List     STOP taking these medications    acetaminophen 500 MG tablet Commonly known as: TYLENOL   diclofenac Sodium 1 % Gel Commonly known as: VOLTAREN   docusate sodium 100 MG capsule Commonly known as: COLACE   empagliflozin 10 MG Tabs tablet Commonly known as: JARDIANCE   gabapentin 300 MG capsule Commonly known as: NEURONTIN   metFORMIN 500 MG tablet Commonly known as: GLUCOPHAGE   metoprolol tartrate 25 MG tablet Commonly known as: LOPRESSOR   polyethylene glycol 17 g packet Commonly known as: MIRALAX / GLYCOLAX   pravastatin  40 MG tablet Commonly known as: PRAVACHOL       TAKE these medications    LORazepam 0.5 MG tablet Commonly known as: Ativan Take 1 tablet (0.5 mg total) by mouth every 8 (eight) hours as needed for anxiety.   morphine CONCENTRATE 10 mg / 0.5 ml concentrated solution Take 0.25 mLs (5 mg total) by mouth every 2 (two) hours as needed for severe pain.        Discharge Exam: Filed Weights   04/15/22 1441  Weight: 71.22 kg   76 year old female lying  in the bed comfortably Lungs decreased air entry at the bases Heart regular rate and rhythm Abdomen soft, benign Extremities no pedal edema cyanosis Neuro alert and awake, nonfocal Oral cavity: Fungating mass on the right mandible area inside the mouth  Condition at discharge: poor  The results of significant diagnostics from this hospitalization (including imaging, microbiology, ancillary and laboratory) are listed below for reference.   Imaging Studies: DG Chest Port 1 View  Result Date: 04/20/2022 CLINICAL DATA:  Fever, altered mental status EXAM: PORTABLE CHEST 1 VIEW COMPARISON:  Previous studies including the examination of 04/02/2022 FINDINGS: Cardiac size is within normal limits. Thoracic aorta is tortuous and ectatic. Central pulmonary vessels are prominent. Increased interstitial markings and patchy alveolar densities are seen in the parahilar regions and lower lung fields. Lateral CP angles are clear. There is no pneumothorax. Degenerative changes are noted in both shoulders. IMPRESSION: Central pulmonary vessels are prominent. Increased interstitial and alveolar markings in the parahilar regions and lower lung fields may suggest pulmonary edema and possibly multifocal pneumonia. Electronically Signed   By: Elmer Picker M.D.   On: 04/20/2022 13:47   US BIOPSY (LIVER)  Result Date: 04/19/2022 INDICATION: Hepatic masses including large left lobe hepatic mass, multiple pulmonary masses and sigmoid colonic mass. The patient presents for liver biopsy. EXAM: ULTRASOUND GUIDED CORE BIOPSY OF LIVER MEDICATIONS: None. ANESTHESIA/SEDATION: Moderate (conscious) sedation was employed during this procedure. A total of Fentanyl 50 mcg was administered intravenously by radiology nursing. Moderate Sedation Time: 15 minutes. The patient's level of consciousness and vital signs were monitored continuously by radiology nursing throughout the procedure under my direct supervision. PROCEDURE: The  procedure, risks, benefits, and alternatives were explained to the patient. Questions regarding the procedure were encouraged and answered. The patient understands and consents to the procedure. A time-out was performed prior to initiating the procedure. The abdominal wall was prepped with chlorhexidine in a sterile fashion, and a sterile drape was applied covering the operative field. A sterile gown and sterile gloves were used for the procedure. Local anesthesia was provided with 1% Lidocaine. After localizing a left lobe liver mass by ultrasound, a 17 gauge trocar needle was advanced into the lesion under direct ultrasound guidance. Three separate coaxial 18 gauge core biopsy samples were obtained and submitted in formalin. Gel-Foam pledgets were advanced through the outer needle as it was retracted and removed. Additional ultrasound was performed. COMPLICATIONS: None immediate. FINDINGS: Large hyperechoic oval-shaped mass in the left lobe of the liver measures up to 7 cm in greatest diameter by ultrasound. Solid tissue was obtained from the lesion. IMPRESSION: Ultrasound-guided core biopsy performed of a large mass in the left lobe of the liver measuring up to approximately 7 cm in greatest diameter. Electronically Signed   By: Aletta Edouard M.D.   On: 04/19/2022 11:39   CT ABDOMEN PELVIS W CONTRAST  Result Date: 04/15/2022 CLINICAL DATA:  Abdominal pain and distention EXAM: CT ABDOMEN AND  PELVIS WITH CONTRAST TECHNIQUE: Multidetector CT imaging of the abdomen and pelvis was performed using the standard protocol following bolus administration of intravenous contrast. RADIATION DOSE REDUCTION: This exam was performed according to the departmental dose-optimization program which includes automated exposure control, adjustment of the mA and/or kV according to patient size and/or use of iterative reconstruction technique. CONTRAST:  163mL OMNIPAQUE IOHEXOL 300 MG/ML  SOLN COMPARISON:  Previous studies  including the CT abdomen and pelvis done on 01/05/2016 and CT chest done on 01/29/2022 FINDINGS: Lower chest: There are multiple nodules of varying sizes in the lower lung fields. There is a 3.2 cm nodule/mass in the left lower lobe. There are small patchy infiltrates in both lower lobes. Minimal bilateral pleural effusions are seen. Dense calcification is seen in mitral annulus. Hepatobiliary: There is interval appearance of multiple low-density space-occupying lesions in liver largest measuring 7.5 x 4.1 cm in the anterior aspect of the liver. There is no dilation of bile ducts. There is increased density in the dependent portion of gallbladder suggesting gallbladder stones. Pancreas: There is atrophy.  No focal abnormalities are seen. Spleen: Unremarkable. Adrenals/Urinary Tract: There is 11 mm nodule in left adrenal. There are small nodular densities in right adrenal measuring up to 11 mm. There is no hydronephrosis. Lobulations are seen in the margins of the kidneys, possibly suggesting scarring. There is 17 mm calculus in the upper pole of right kidney. There is 12 mm calculus in the midportion. There is 12 mm calculus in the lower pole. Ureters are not dilated. Urinary bladder is distended. There is air in the bladder, possibly suggesting recent instrumentation. Diverticulum is noted in the left posterior aspect of the urinary bladder. Stomach/Bowel: Small hiatal hernia is seen. Stomach is not distended. Small bowel loops not dilated. Appendix is not dilated. Scattered diverticula seen in colon without signs of focal acute diverticulitis. There is wall thickening in 5.7 cm segment of sigmoid colon in midline. Oral contrast has reached the rectum. There is large amount of stool in rectum. Transverse diameter of rectum measures 9.4 cm. There is no significant wall thickening in rectum. Vascular/Lymphatic: Scattered arterial calcifications are seen. Reproductive: There is low-density in the central portion of  the uterus. There is 2.2 cm fluid density structure in the right adnexa. Other: There is no ascites or pneumoperitoneum. Umbilical hernia containing fat is seen. Musculoskeletal: There is previous internal fixation in right femur with intramedullary rod. Degenerative changes are noted in right hip. Degenerative changes are noted in lower thoracic spine and lumbar spine. There is fracture in the upper endplate of body of L3 vertebra which may be recent or old. Decrease in height of multiple thoracic vertebral bodies may suggest recent or old compression fractures. There is inhomogeneous in attenuation in the bony structures this may be due to osteoporosis or infiltrative neoplastic process. There is a lytic lesion with adjacent soft tissue mass in the posterior aspect of left iliac crest. IMPRESSION: There is 5.7 cm mass lesion in sigmoid colon suggesting possible primary malignant neoplasm. There is no evidence of intestinal obstruction or pneumoperitoneum. There is no hydronephrosis. There are multiple nodules of varying sizes in the visualized lower lung fields consistent with pulmonary metastatic disease. Patchy infiltrates in the lower lung fields may suggest atelectasis/pneumonia. Small bilateral pleural effusions. There are multiple space-occupying lesions of varying sizes in the liver measuring up to 7.5 cm in size suggesting hepatic metastatic disease. There are few nodular densities in both adrenals suggesting possible metastatic disease. There is  inhomogeneous attenuation in the bony structures suggesting possible skeletal metastatic disease. There is a lytic lesion in the posterior medial aspect of left iliac crest consistent with skeletal metastatic disease. Other findings as described in the body of the report. Electronically Signed   By: Elmer Picker M.D.   On: 04/15/2022 20:16   CT Maxillofacial W Contrast  Result Date: 04/15/2022 CLINICAL DATA:  Facial abscess EXAM: CT MAXILLOFACIAL WITH  CONTRAST TECHNIQUE: Multidetector CT imaging of the maxillofacial structures was performed with intravenous contrast. Multiplanar CT image reconstructions were also generated. RADIATION DOSE REDUCTION: This exam was performed according to the departmental dose-optimization program which includes automated exposure control, adjustment of the mA and/or kV according to patient size and/or use of iterative reconstruction technique. CONTRAST:  111mL OMNIPAQUE IOHEXOL 300 MG/ML  SOLN COMPARISON:  None Available. FINDINGS: Osseous: The right mandibular body is expanded and hyperlucent with the abnormal area encompassing the roots of the right mandibular molars. There is multifocal cortical destruction. There is a surrounding soft tissue component Orbits: Negative. No traumatic or inflammatory finding. Sinuses: Clear. Soft tissues: Soft tissue swelling surrounding the right mandibular body. Limited intracranial: No significant or unexpected finding. 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 Clinical follow-up after resolution of symptoms is recommended to exclude a superimposed neoplastic process. Electronically Signed   By: Ulyses Jarred M.D.   On: 04/15/2022 20:04   DG Abdomen 1 View  Result Date: 04/15/2022 CLINICAL DATA:  Constipation.  Abdominal distension. EXAM: ABDOMEN - 1 VIEW COMPARISON:  04/09/22 FINDINGS: Gallstones are identified. There is been interval decrease in previously noted air-filled loops of small bowel within the left hemiabdomen. Progressive accumulation of stool is identified throughout the colon and rectum. Large volume of desiccated stool within the rectum has increased in volume compared with the previous study. Cannot rule out rectal impaction. No signs of pneumoperitoneum. Lumbar scoliosis and degenerative disc disease. Status post  ORIF of the right femur. Marked degenerative disc disease is noted involving the right hip. Moderate degenerative disc disease noted in the left hip. IMPRESSION: 1. Progressive accumulation of stool throughout the colon and rectum compatible with constipation. Cannot rule out rectal impaction. 2. Interval decrease in previously noted air-filled loops of small bowel within the left hemiabdomen. 3. Gallstones. Electronically Signed   By: Kerby Moors M.D.   On: 04/15/2022 16:13   DG Abd 1 View  Result Date: 04/09/2022 CLINICAL DATA:  Constipation. EXAM: ABDOMEN - 1 VIEW COMPARISON:  Pelvis radiograph 04/02/2022 FINDINGS: Prominent stool noted at the rectum. Mild gaseous distension above this point. No definite free air. Calcifications in the right abdomen likely gallstones. IMPRESSION: 1. Prominent stool at the rectum with mild gaseous distension above this point. 2. Cholelithiasis. Electronically Signed   By: San Morelle M.D.   On: 04/09/2022 12:27   DG Chest Portable 1 View  Result Date: 04/02/2022 CLINICAL DATA:  Recent fall with right-sided pain, initial encounter EXAM: PORTABLE CHEST 1 VIEW COMPARISON:  01/28/2022 FINDINGS: Cardiac shadow is within normal limits. Aortic calcifications are again seen. Increased vascular congestion and mild edema is noted. No focal confluent infiltrate is seen. Old rib fractures are noted bilaterally with healing. IMPRESSION: No acute abnormality noted. Electronically Signed   By: Inez Catalina M.D.   On: 04/02/2022 21:50   DG Knee 2 Views Right  Result Date: 04/02/2022 CLINICAL DATA:  Right leg pain following fall,  initial encounter EXAM: RIGHT KNEE - 2 VIEW COMPARISON:  None Available. FINDINGS: Postsurgical changes in the distal femur are noted. No acute fracture is noted. IMPRESSION: No acute abnormality noted. Electronically Signed   By: Inez Catalina M.D.   On: 04/02/2022 21:49   DG Hip Unilat W or Wo Pelvis 2-3 Views Right  Result Date:  04/02/2022 CLINICAL DATA:  Right-sided hip pain following fall several hours ago, initial encounter EXAM: DG HIP (WITH OR WITHOUT PELVIS) 2-3V RIGHT COMPARISON:  02/07/2019 FINDINGS: Pelvic ring is intact. Postsurgical changes in the proximal right femur are noted. Degenerative changes of the right hip joint are noted. No acute fracture or dislocation is seen. No soft tissue changes are noted. IMPRESSION: Degenerative change in the right hip joint. No acute abnormality is noted. Electronically Signed   By: Inez Catalina M.D.   On: 04/02/2022 21:48    Microbiology: Results for orders placed or performed during the hospital encounter of 04/15/22  Blood culture (routine x 2)     Status: Abnormal   Collection Time: 04/15/22  7:11 PM   Specimen: BLOOD LEFT WRIST  Result Value Ref Range Status   Specimen Description   Final    BLOOD LEFT WRIST Performed at [facility], [address] [place], [place] [address]    Special Requests   Final    BOTTLES DRAWN AEROBIC AND ANAEROBIC Blood Culture adequate volume Performed at [facility], [address], [place], [place] [address]    Culture  Setup Time   Final    GRAM POSITIVE COCCI ANAEROBIC BOTTLE ONLY Organism ID to follow CRITICAL RESULT CALLED TO, READ BACK BY AND VERIFIED WITH: RODNEY GRUBB 04/16/22 1451 DE    Culture (A)  Final    STAPHYLOCOCCUS EPIDERMIDIS THE SIGNIFICANCE OF ISOLATING THIS ORGANISM FROM A SINGLE SET OF BLOOD CULTURES WHEN MULTIPLE SETS ARE DRAWN IS UNCERTAIN. PLEASE NOTIFY THE MICROBIOLOGY DEPARTMENT WITHIN ONE WEEK IF SPECIATION AND SENSITIVITIES ARE REQUIRED. Performed at [facility], [place] [address] [place], [place] [address]    Report Status 04/18/2022 FINAL  Final  Blood Culture ID Panel (Reflexed)     Status: Abnormal   Collection Time: 04/15/22  7:11 PM  Result Value Ref Range Status   Enterococcus faecalis NOT DETECTED NOT DETECTED Final   Enterococcus Faecium NOT DETECTED NOT DETECTED  Final   Listeria monocytogenes NOT DETECTED NOT DETECTED Final   Staphylococcus species DETECTED (A) NOT DETECTED Final    Comment: CRITICAL RESULT CALLED TO, READ BACK BY AND VERIFIED WITH: RODNEY GRUBB 04/16/22 1451 DE    Staphylococcus aureus (BCID) NOT DETECTED NOT DETECTED Final   Staphylococcus epidermidis DETECTED (A) NOT DETECTED Final    Comment: Methicillin (oxacillin) resistant coagulase negative staphylococcus. Possible blood culture contaminant (unless isolated from more than one blood culture draw or clinical case suggests pathogenicity). No antibiotic treatment is indicated for blood  culture contaminants. CRITICAL RESULT CALLED TO, READ BACK BY AND VERIFIED WITH: RODNEY GRUBB 04/16/22 1451 DE    Staphylococcus lugdunensis NOT DETECTED NOT DETECTED Final   Streptococcus species NOT DETECTED NOT DETECTED Final   Streptococcus agalactiae NOT DETECTED NOT DETECTED Final   Streptococcus pneumoniae NOT DETECTED NOT DETECTED Final   Streptococcus pyogenes NOT DETECTED NOT DETECTED Final   A.calcoaceticus-baumannii NOT DETECTED NOT DETECTED Final   Bacteroides fragilis NOT DETECTED NOT DETECTED Final   Enterobacterales NOT DETECTED NOT DETECTED Final   Enterobacter cloacae complex NOT DETECTED NOT DETECTED Final   Escherichia  coli NOT DETECTED NOT DETECTED Final   Klebsiella aerogenes NOT DETECTED NOT DETECTED Final   Klebsiella oxytoca NOT DETECTED NOT DETECTED Final   Klebsiella pneumoniae NOT DETECTED NOT DETECTED Final   Proteus species NOT DETECTED NOT DETECTED Final   Salmonella species NOT DETECTED NOT DETECTED Final   Serratia marcescens NOT DETECTED NOT DETECTED Final   Haemophilus influenzae NOT DETECTED NOT DETECTED Final   Neisseria meningitidis NOT DETECTED NOT DETECTED Final   Pseudomonas aeruginosa NOT DETECTED NOT DETECTED Final   Stenotrophomonas maltophilia NOT DETECTED NOT DETECTED Final   Candida albicans NOT DETECTED NOT DETECTED Final   Candida auris  NOT DETECTED NOT DETECTED Final   Candida glabrata NOT DETECTED NOT DETECTED Final   Candida krusei NOT DETECTED NOT DETECTED Final   Candida parapsilosis NOT DETECTED NOT DETECTED Final   Candida tropicalis NOT DETECTED NOT DETECTED Final   Cryptococcus neoformans/gattii NOT DETECTED NOT DETECTED Final   Methicillin resistance mecA/C DETECTED (A) NOT DETECTED Final    Comment: CRITICAL RESULT CALLED TO, READ BACK BY AND VERIFIED WITH: RODNEY GRUBB 04/16/22 1451 Performed at [facility], [address] [place], [place] [address]   Culture, blood (Routine X 2) w Reflex to ID Panel     Status: None   Collection Time: 04/17/22  4:51 AM   Specimen: BLOOD  Result Value Ref Range Status   Specimen Description BLOOD LEFT ANTECUBITAL  Final   Special Requests   Final    BOTTLES DRAWN AEROBIC AND ANAEROBIC Blood Culture results may not be optimal due to an inadequate volume of blood received in culture bottles   Culture   Final    NO GROWTH 5 DAYS Performed at [facility], [address] [place], [place] [address]    Report Status 04/22/2022 FINAL  Final  Culture, blood (Routine X 2) w Reflex to ID Panel     Status: None (Preliminary result)   Collection Time: 04/20/22 12:40 PM   Specimen: BLOOD  Result Value Ref Range Status   Specimen Description BLOOD BLOOD LEFT FOREARM  Final   Special Requests   Final    BOTTLES DRAWN AEROBIC AND ANAEROBIC Blood Culture adequate volume   Culture   Final    NO GROWTH 4 DAYS Performed at [facility], [address] [place], [place] [address]    Report Status PENDING  Incomplete  Culture, blood (Routine X 2) w Reflex to ID Panel     Status: None (Preliminary result)   Collection Time: 04/20/22 12:45 PM   Specimen: BLOOD  Result Value Ref Range Status   Specimen Description BLOOD BLOOD LEFT HAND  Final   Special Requests   Final    BOTTLES DRAWN AEROBIC AND ANAEROBIC Blood Culture adequate volume   Culture   Final     NO GROWTH 4 DAYS Performed at [facility], [address], [place],  [address]    Report Status PENDING  Incomplete    Labs: CBC: Recent Labs  Lab 04/21/22 1135  WBC 22.9*  HGB 12.8  HCT 41.7  MCV 89.1  PLT 841   Basic Metabolic Panel: Recent Labs  Lab 04/18/22 0430 04/20/22 1545 04/21/22 1135 04/22/22 0535  NA  --  138  --   --   K  --  3.1* 2.9* 2.8*  CL  --  97*  --   --   CO2  --  31  --   --   GLUCOSE  --  121*  --   --   BUN  --  9  --   --   CREATININE 0.47 0.38*  --   --   CALCIUM  --  8.6*  --   --    Liver Function Tests: No results for input(s): "AST", "ALT", "ALKPHOS", "BILITOT", "PROT", "ALBUMIN" in the last 168 hours. CBG: Recent Labs  Lab 04/22/22 2045 04/23/22 0756 04/23/22 1237 04/23/22 1641 04/23/22 2131  GLUCAP 148* 110* 131* 88 110*    Discharge time spent: greater than 30 minutes.  Signed: Max Sane, MD [facility] 04/24/2022

## 2022-04-24 NOTE — Progress Notes (Signed)
Report called to Jenny Reichmann, RN @ [facility].

## 2022-04-25 LAB — CULTURE, BLOOD (ROUTINE X 2)
Culture: NO GROWTH
Culture: NO GROWTH
Special Requests: ADEQUATE
Special Requests: ADEQUATE

## 2022-05-07 NOTE — Progress Notes (Signed)
This patient was newly hypoxic, the BNP was to further assess for underlying CHF or to assess for RH strain from PE

## 2022-05-27 DEATH — deceased

## 2022-07-07 ENCOUNTER — Other Ambulatory Visit (INDEPENDENT_AMBULATORY_CARE_PROVIDER_SITE_OTHER): Payer: Self-pay | Admitting: Nurse Practitioner

## 2022-07-07 DIAGNOSIS — I739 Peripheral vascular disease, unspecified: Secondary | ICD-10-CM

## 2022-07-12 ENCOUNTER — Encounter (INDEPENDENT_AMBULATORY_CARE_PROVIDER_SITE_OTHER): Payer: Medicare HMO

## 2022-07-12 ENCOUNTER — Ambulatory Visit (INDEPENDENT_AMBULATORY_CARE_PROVIDER_SITE_OTHER): Payer: Medicare HMO | Admitting: Vascular Surgery
